# Patient Record
Sex: Female | Born: 1950 | Race: Black or African American | Hispanic: No | Marital: Single | Smoking: Former smoker
Health system: Southern US, Community
[De-identification: ages and names within clinical notes are randomized; demographics above are authoritative.]

## PROBLEM LIST (undated history)

## (undated) DIAGNOSIS — I10 Essential (primary) hypertension: Secondary | ICD-10-CM

## (undated) DIAGNOSIS — F1410 Cocaine abuse, uncomplicated: Secondary | ICD-10-CM

## (undated) DIAGNOSIS — Z91199 Patient's noncompliance with other medical treatment and regimen due to unspecified reason: Secondary | ICD-10-CM

## (undated) DIAGNOSIS — C649 Malignant neoplasm of unspecified kidney, except renal pelvis: Secondary | ICD-10-CM

## (undated) DIAGNOSIS — M792 Neuralgia and neuritis, unspecified: Secondary | ICD-10-CM

## (undated) DIAGNOSIS — N898 Other specified noninflammatory disorders of vagina: Principal | ICD-10-CM

## (undated) DIAGNOSIS — M1990 Unspecified osteoarthritis, unspecified site: Secondary | ICD-10-CM

## (undated) DIAGNOSIS — I471 Supraventricular tachycardia, unspecified: Secondary | ICD-10-CM

## (undated) DIAGNOSIS — J45909 Unspecified asthma, uncomplicated: Secondary | ICD-10-CM

## (undated) DIAGNOSIS — E119 Type 2 diabetes mellitus without complications: Secondary | ICD-10-CM

## (undated) DIAGNOSIS — K219 Gastro-esophageal reflux disease without esophagitis: Secondary | ICD-10-CM

## (undated) DIAGNOSIS — B1920 Unspecified viral hepatitis C without hepatic coma: Secondary | ICD-10-CM

## (undated) DIAGNOSIS — R21 Rash and other nonspecific skin eruption: Secondary | ICD-10-CM

## (undated) DIAGNOSIS — G4730 Sleep apnea, unspecified: Secondary | ICD-10-CM

## (undated) DIAGNOSIS — I6529 Occlusion and stenosis of unspecified carotid artery: Secondary | ICD-10-CM

## (undated) DIAGNOSIS — F1010 Alcohol abuse, uncomplicated: Secondary | ICD-10-CM

## (undated) DIAGNOSIS — E785 Hyperlipidemia, unspecified: Secondary | ICD-10-CM

## (undated) DIAGNOSIS — Z9119 Patient's noncompliance with other medical treatment and regimen: Secondary | ICD-10-CM

## (undated) DIAGNOSIS — B373 Candidiasis of vulva and vagina: Secondary | ICD-10-CM

## (undated) DIAGNOSIS — L900 Lichen sclerosus et atrophicus: Principal | ICD-10-CM

## (undated) HISTORY — PX: TONSILLECTOMY: SUR1361

## (undated) HISTORY — DX: Lichen sclerosus et atrophicus: L90.0

## (undated) HISTORY — DX: Essential (primary) hypertension: I10

## (undated) HISTORY — DX: Supraventricular tachycardia, unspecified: I47.10

## (undated) HISTORY — PX: OTHER SURGICAL HISTORY: SHX169

## (undated) HISTORY — DX: Supraventricular tachycardia: I47.1

## (undated) HISTORY — DX: Sleep apnea, unspecified: G47.30

## (undated) HISTORY — DX: Type 2 diabetes mellitus without complications: E11.9

## (undated) HISTORY — PX: ABDOMINAL HYSTERECTOMY: SHX81

## (undated) HISTORY — DX: Candidiasis of vulva and vagina: B37.3

## (undated) HISTORY — DX: Other specified noninflammatory disorders of vagina: N89.8

---

## 2008-12-23 ENCOUNTER — Emergency Department (HOSPITAL_COMMUNITY): Admission: EM | Admit: 2008-12-23 | Discharge: 2008-12-23 | Payer: Self-pay | Admitting: Emergency Medicine

## 2010-04-25 LAB — URINE CULTURE: Colony Count: >=100000

## 2010-04-25 LAB — URINE MICROSCOPIC-ADD ON

## 2010-04-25 LAB — URINALYSIS, ROUTINE W REFLEX MICROSCOPIC
Bilirubin Urine: NEGATIVE
Glucose, UA: >1000 mg/dL — AB
Ketones, ur: NEGATIVE mg/dL
Nitrite: POSITIVE — AB
Specific Gravity, Urine: 1.015 (ref 1.005–1.030)
pH: 5 (ref 5.0–8.0)

## 2010-06-24 ENCOUNTER — Emergency Department (HOSPITAL_COMMUNITY)
Admission: EM | Admit: 2010-06-24 | Discharge: 2010-06-24 | Disposition: A | Discharge status: Home / Self Care | Payer: Self-pay | Attending: Emergency Medicine | Admitting: Emergency Medicine

## 2010-06-24 DIAGNOSIS — E119 Type 2 diabetes mellitus without complications: Secondary | ICD-10-CM | POA: Insufficient documentation

## 2010-06-24 DIAGNOSIS — I10 Essential (primary) hypertension: Secondary | ICD-10-CM | POA: Insufficient documentation

## 2010-06-24 DIAGNOSIS — Z79899 Other long term (current) drug therapy: Secondary | ICD-10-CM | POA: Insufficient documentation

## 2010-06-24 DIAGNOSIS — G8918 Other acute postprocedural pain: Secondary | ICD-10-CM | POA: Insufficient documentation

## 2010-06-24 DIAGNOSIS — N949 Unspecified condition associated with female genital organs and menstrual cycle: Secondary | ICD-10-CM | POA: Insufficient documentation

## 2010-06-24 LAB — GLUCOSE, CAPILLARY: Glucose-Capillary: 360 mg/dL — ABNORMAL HIGH (ref 70–99)

## 2012-04-10 ENCOUNTER — Encounter (HOSPITAL_COMMUNITY): Payer: Self-pay | Admitting: *Deleted

## 2012-04-10 ENCOUNTER — Emergency Department (HOSPITAL_COMMUNITY)
Admission: EM | Admit: 2012-04-10 | Discharge: 2012-04-11 | Disposition: A | Discharge status: Home / Self Care | Payer: Self-pay | Attending: Emergency Medicine | Admitting: Emergency Medicine

## 2012-04-10 ENCOUNTER — Emergency Department (HOSPITAL_COMMUNITY): Payer: Self-pay

## 2012-04-10 DIAGNOSIS — K5900 Constipation, unspecified: Secondary | ICD-10-CM | POA: Insufficient documentation

## 2012-04-10 DIAGNOSIS — I10 Essential (primary) hypertension: Secondary | ICD-10-CM | POA: Insufficient documentation

## 2012-04-10 DIAGNOSIS — R1110 Vomiting, unspecified: Secondary | ICD-10-CM | POA: Insufficient documentation

## 2012-04-10 DIAGNOSIS — R739 Hyperglycemia, unspecified: Secondary | ICD-10-CM

## 2012-04-10 DIAGNOSIS — Z8669 Personal history of other diseases of the nervous system and sense organs: Secondary | ICD-10-CM | POA: Insufficient documentation

## 2012-04-10 DIAGNOSIS — R109 Unspecified abdominal pain: Secondary | ICD-10-CM | POA: Insufficient documentation

## 2012-04-10 DIAGNOSIS — N390 Urinary tract infection, site not specified: Secondary | ICD-10-CM | POA: Insufficient documentation

## 2012-04-10 DIAGNOSIS — E1169 Type 2 diabetes mellitus with other specified complication: Secondary | ICD-10-CM | POA: Insufficient documentation

## 2012-04-10 DIAGNOSIS — F17200 Nicotine dependence, unspecified, uncomplicated: Secondary | ICD-10-CM | POA: Insufficient documentation

## 2012-04-10 DIAGNOSIS — R7989 Other specified abnormal findings of blood chemistry: Secondary | ICD-10-CM | POA: Insufficient documentation

## 2012-04-10 DIAGNOSIS — R42 Dizziness and giddiness: Secondary | ICD-10-CM | POA: Insufficient documentation

## 2012-04-10 DIAGNOSIS — J45909 Unspecified asthma, uncomplicated: Secondary | ICD-10-CM | POA: Insufficient documentation

## 2012-04-10 HISTORY — DX: Neuralgia and neuritis, unspecified: M79.2

## 2012-04-10 HISTORY — DX: Unspecified asthma, uncomplicated: J45.909

## 2012-04-10 LAB — COMPREHENSIVE METABOLIC PANEL
ALT: 183 U/L — ABNORMAL HIGH (ref 0–35)
Alkaline Phosphatase: 176 U/L — ABNORMAL HIGH (ref 39–117)
BUN: 9 mg/dL (ref 6–23)
CO2: 28 mEq/L (ref 19–32)
Chloride: 95 mEq/L — ABNORMAL LOW (ref 96–112)
GFR calc Af Amer: >90 mL/min (ref >90)
GFR calc non Af Amer: 90 mL/min — ABNORMAL LOW (ref >90)
Glucose, Bld: 468 mg/dL — ABNORMAL HIGH (ref 70–99)
Potassium: 3.1 mEq/L — ABNORMAL LOW (ref 3.5–5.1)
Sodium: 134 mEq/L — ABNORMAL LOW (ref 135–145)
Total Bilirubin: 0.4 mg/dL (ref 0.3–1.2)

## 2012-04-10 LAB — CBC WITH DIFFERENTIAL/PLATELET
Hemoglobin: 14.1 g/dL (ref 12.0–15.0)
Lymphocytes Relative: 60 % — ABNORMAL HIGH (ref 12–46)
Lymphs Abs: 4.4 10*3/uL — ABNORMAL HIGH (ref 0.7–4.0)
MCV: 87.9 fL (ref 78.0–100.0)
Monocytes Relative: 8 % (ref 3–12)
Neutrophils Relative %: 31 % — ABNORMAL LOW (ref 43–77)
Platelets: 181 10*3/uL (ref 150–400)
RBC: 4.78 MIL/uL (ref 3.87–5.11)
WBC: 7.3 10*3/uL (ref 4.0–10.5)

## 2012-04-10 LAB — URINALYSIS, ROUTINE W REFLEX MICROSCOPIC
Bilirubin Urine: NEGATIVE
Ketones, ur: NEGATIVE mg/dL
Nitrite: POSITIVE — AB
Protein, ur: NEGATIVE mg/dL
Urobilinogen, UA: 0.2 mg/dL (ref 0.0–1.0)
pH: 5.5 (ref 5.0–8.0)

## 2012-04-10 LAB — URINE MICROSCOPIC-ADD ON

## 2012-04-10 MED ORDER — INSULIN ASPART 100 UNIT/ML IV SOLN
10.0000 [IU] | Freq: Once | INTRAVENOUS | Status: AC
  Administered 2012-04-10: 10 [IU] via INTRAVENOUS

## 2012-04-10 MED ORDER — CEPHALEXIN 500 MG PO CAPS: 500.0000 mg | ORAL_CAPSULE | Freq: Four times a day (QID) | ORAL | Status: DC

## 2012-04-10 MED ORDER — DEXTROSE 5 % IV SOLN
1.0000 g | Freq: Once | INTRAVENOUS | Status: AC
  Administered 2012-04-10: 1 g via INTRAVENOUS
  Filled 2012-04-10: qty 10

## 2012-04-10 MED ORDER — SODIUM CHLORIDE 0.9 % IV BOLUS (SEPSIS)
1000.0000 mL | Freq: Once | INTRAVENOUS | Status: AC
Start: 2012-04-10 — End: 2012-04-11
  Administered 2012-04-10: 1000 mL via INTRAVENOUS

## 2012-04-10 NOTE — ED Notes (Addendum)
CBG has been over 300 at home., constipated, feels weak,  Dizzy. Nausea , no vomiting in last day.  Alert, ambulatory.

## 2012-04-10 NOTE — ED Provider Notes (Signed)
History  This chart was scribed for Orpah Greek, MD by Roe Coombs, ED Scribe. The patient was seen in room APA07/APA07. Patient's care was started at 2021.   CSN: [id number]  Arrival date & time 04/10/12  2002   First MD Initiated Contact with Patient 04/10/12 2021      Chief Complaint  Patient presents with  . Weakness     The history is provided by the patient. No language interpreter was used.    HPI Comments: Cassandra Jordan is a 62 y.o. female who presents to the Emergency Department complaining of generalized weakness and dizziness for the last few days. There is associated constipation. Patient states that she had multiple episodes of vomiting last Saturday (5 days ago), but none within the last 24 hours. She states that she has upper abdominal discomfort with swallowing and eating. Patient denies headache, chest pain, shortness of breath, fever, dysuria, urinary frequency or diarrhea. She states that her CBG has been over 300 at home. Patient is a current every day smoker.   Past Medical History  Diagnosis Date  . Diabetes mellitus without complication   . Hypertension   . Asthma   . Neuropathic pain     History reviewed. No pertinent past surgical history.  History reviewed. No pertinent family history.  History  Substance Use Topics  . Smoking status: Current Every Day Smoker  . Smokeless tobacco: Not on file  . Alcohol Use: Yes    OB History   Grav Para Term Preterm Abortions TAB SAB Ect Mult Living                  Review of Systems  Constitutional: Negative for fever.  Gastrointestinal: Positive for vomiting, abdominal pain and constipation. Negative for diarrhea.  Genitourinary: Negative for dysuria and frequency.  Neurological: Positive for dizziness and weakness.    Allergies  Sulfa antibiotics  Home Medications  No current outpatient prescriptions on file.  Triage Vitals: BP 136/89  Pulse 107  Resp 20  Ht 5' (1.524 m)  Wt  160 lb (72.576 kg)  BMI 31.25 kg/m2  SpO2 96%  Physical Exam  Constitutional: She is oriented to person, place, and time. She appears well-developed and well-nourished. No distress.  HENT:  Head: Normocephalic and atraumatic.  Right Ear: Hearing normal.  Nose: Nose normal.  Mouth/Throat: Oropharynx is clear and moist and mucous membranes are normal.  Eyes: Conjunctivae and EOM are normal. Pupils are equal, round, and reactive to light.  Neck: Normal range of motion. Neck supple.  Cardiovascular: Normal rate, regular rhythm, S1 normal and S2 normal.  Exam reveals no gallop and no friction rub.   No murmur heard. Pulmonary/Chest: Effort normal and breath sounds normal. No respiratory distress. She exhibits no tenderness.  Abdominal: Soft. Normal appearance and bowel sounds are normal. There is no hepatosplenomegaly. There is no tenderness. There is no rebound, no guarding, no tenderness at McBurney's point and negative Murphy's sign. No hernia.  Musculoskeletal: Normal range of motion.  Neurological: She is alert and oriented to person, place, and time. She has normal strength. No cranial nerve deficit or sensory deficit. Coordination normal. GCS eye subscore is 4. GCS verbal subscore is 5. GCS motor subscore is 6.  Skin: Skin is warm, dry and intact. No rash noted. No cyanosis.  Psychiatric: She has a normal mood and affect. Her speech is normal and behavior is normal. Thought content normal.    ED Course  Procedures (including critical care  time) DIAGNOSTIC STUDIES: Oxygen Saturation is 96% on room air, adequate by my interpretation.    COORDINATION OF CARE: 8:36 PM- Patient informed of current plan for treatment and evaluation and agrees with plan at this time.      Labs Reviewed  CBC WITH DIFFERENTIAL - Abnormal; Notable for the following:    Neutrophils Relative 31 (*)    Lymphocytes Relative 60 (*)    Lymphs Abs 4.4 (*)    All other components within normal limits   COMPREHENSIVE METABOLIC PANEL - Abnormal; Notable for the following:    Sodium 134 (*)    Potassium 3.1 (*)    Chloride 95 (*)    Glucose, Bld 468 (*)    Total Protein 8.5 (*)    AST 144 (*)    ALT 183 (*)    Alkaline Phosphatase 176 (*)    GFR calc non Af Amer 90 (*)    All other components within normal limits  URINALYSIS, ROUTINE W REFLEX MICROSCOPIC - Abnormal; Notable for the following:    APPearance TURBID (*)    Glucose, UA >1000 (*)    Nitrite POSITIVE (*)    All other components within normal limits  URINE MICROSCOPIC-ADD ON - Abnormal; Notable for the following:    Squamous Epithelial / LPF MANY (*)    Bacteria, UA MANY (*)    All other components within normal limits  URINE CULTURE  LIPASE, BLOOD   Dg Abd Acute W/chest  04/10/2012  *RADIOLOGY REPORT*  Clinical Data: Upper abdominal pain and burning.  Vomiting 5 days ago.  Constipation.  Weakness.  ACUTE ABDOMEN SERIES (ABDOMEN 2 VIEW & CHEST 1 VIEW)  Comparison: None.  Findings: The heart size and pulmonary vascularity are normal. The lungs appear clear and expanded without focal air space disease or consolidation. No blunting of the costophrenic angles.  No pneumothorax.  Mediastinal contours appear intact.  Gas and stool throughout the colon.  No small or large bowel distension.  No free intra-abdominal air.  No abnormal air fluid levels.  Calcifications in the pelvis consistent with phleboliths. No radiopaque stones identified.  Mild degenerative changes in the spine.  IMPRESSION: No evidence of active pulmonary disease.  Stool filled colon. Nonobstructive bowel gas pattern.   Original Report Authenticated By: Lucienne Capers, M.D.      Diagnoses: 1. Hyperglycemia 2. UTI 3. Elevated LFTs    MDM  This presents to the ER for evaluation of dizziness. She reports that she has been experiencing high blood sugars today. She has had nausea but no vomiting. She denies headache. Denies any other focal complaints other than she  has been constipated for a few days. Blood work revealed her to grab a 400. She will be administered IV fluids and insulin for the close reduction. No sign of DKA at this time.  Patient does have evidence of urinary tract infection which is treated with Rocephin and will be treated as an outpatient with continued cephalosporin antibiotic. The urinary tract infection is likely causing some of her elevated blood sugars, but there is no sign of pyelonephritis.  Patient's blood work also showed elevated LFTs. Went back to discuss this with the patient she tells me that this has been chronic for her. Her doctor has told her about this before. There is no elevated bilirubin and there is no tenderness. I reexamined her and pressed deeply into her right upper quadrant and there is still no tenderness there. She'll therefore be referred back to her  doctor for this.    I personally performed the services described in this documentation, which was scribed in my presence. The recorded information has been reviewed and is accurate.    Orpah Greek, MD 04/10/12 [phone]

## 2012-04-11 NOTE — ED Provider Notes (Signed)
5612 Assumed care/disposition of patient.Patient has been given insulin. Awaiting result. 0101Glucose after being given IVF and insulin is 211. Spoke with patient regarding the importance of taking medicines of a regular basis. She is in agreement. Will discharge home. Pt stable in ED with no significant deterioration in condition.The patient appears reasonably screened and/or stabilized for discharge and I doubt any other medical condition or other [facility] requiring further screening, evaluation, or treatment in the ED at this time prior to discharge.     Gypsy Balsam. Olin Hauser, MD 04/11/12 [id number]

## 2012-04-13 LAB — URINE CULTURE

## 2012-04-14 NOTE — ED Notes (Signed)
+   Urine Patient treated with Keflex-sensitive to same-chart appended per protocol MD.

## 2012-07-19 ENCOUNTER — Encounter (HOSPITAL_COMMUNITY): Payer: Self-pay | Admitting: *Deleted

## 2012-07-19 ENCOUNTER — Emergency Department (HOSPITAL_COMMUNITY)
Admission: EM | Admit: 2012-07-19 | Discharge: 2012-07-19 | Disposition: A | Discharge status: Home / Self Care | Payer: Self-pay | Attending: Emergency Medicine | Admitting: Emergency Medicine

## 2012-07-19 DIAGNOSIS — IMO0002 Reserved for concepts with insufficient information to code with codable children: Secondary | ICD-10-CM | POA: Insufficient documentation

## 2012-07-19 DIAGNOSIS — I10 Essential (primary) hypertension: Secondary | ICD-10-CM | POA: Insufficient documentation

## 2012-07-19 DIAGNOSIS — E1169 Type 2 diabetes mellitus with other specified complication: Secondary | ICD-10-CM | POA: Insufficient documentation

## 2012-07-19 DIAGNOSIS — Y9301 Activity, walking, marching and hiking: Secondary | ICD-10-CM | POA: Insufficient documentation

## 2012-07-19 DIAGNOSIS — Z794 Long term (current) use of insulin: Secondary | ICD-10-CM | POA: Insufficient documentation

## 2012-07-19 DIAGNOSIS — F17200 Nicotine dependence, unspecified, uncomplicated: Secondary | ICD-10-CM | POA: Insufficient documentation

## 2012-07-19 DIAGNOSIS — Y9289 Other specified places as the place of occurrence of the external cause: Secondary | ICD-10-CM | POA: Insufficient documentation

## 2012-07-19 DIAGNOSIS — R259 Unspecified abnormal involuntary movements: Secondary | ICD-10-CM | POA: Insufficient documentation

## 2012-07-19 DIAGNOSIS — Z8669 Personal history of other diseases of the nervous system and sense organs: Secondary | ICD-10-CM | POA: Insufficient documentation

## 2012-07-19 DIAGNOSIS — R42 Dizziness and giddiness: Secondary | ICD-10-CM | POA: Insufficient documentation

## 2012-07-19 DIAGNOSIS — R5381 Other malaise: Secondary | ICD-10-CM | POA: Insufficient documentation

## 2012-07-19 DIAGNOSIS — R296 Repeated falls: Secondary | ICD-10-CM | POA: Insufficient documentation

## 2012-07-19 DIAGNOSIS — Z79899 Other long term (current) drug therapy: Secondary | ICD-10-CM | POA: Insufficient documentation

## 2012-07-19 DIAGNOSIS — R300 Dysuria: Secondary | ICD-10-CM | POA: Insufficient documentation

## 2012-07-19 DIAGNOSIS — Z91199 Patient's noncompliance with other medical treatment and regimen due to unspecified reason: Secondary | ICD-10-CM | POA: Insufficient documentation

## 2012-07-19 DIAGNOSIS — Z9119 Patient's noncompliance with other medical treatment and regimen: Secondary | ICD-10-CM | POA: Insufficient documentation

## 2012-07-19 DIAGNOSIS — R531 Weakness: Secondary | ICD-10-CM

## 2012-07-19 DIAGNOSIS — J45909 Unspecified asthma, uncomplicated: Secondary | ICD-10-CM | POA: Insufficient documentation

## 2012-07-19 DIAGNOSIS — N390 Urinary tract infection, site not specified: Secondary | ICD-10-CM | POA: Insufficient documentation

## 2012-07-19 DIAGNOSIS — R739 Hyperglycemia, unspecified: Secondary | ICD-10-CM

## 2012-07-19 LAB — CBC WITH DIFFERENTIAL/PLATELET
Basophils Relative: 0 % (ref 0–1)
HCT: 41 % (ref 36.0–46.0)
Hemoglobin: 14 g/dL (ref 12.0–15.0)
Lymphocytes Relative: 49 % — ABNORMAL HIGH (ref 12–46)
MCHC: 34.1 g/dL (ref 30.0–36.0)
Monocytes Absolute: 0.8 10*3/uL (ref 0.1–1.0)
Monocytes Relative: 8 % (ref 3–12)
Neutro Abs: 4.1 10*3/uL (ref 1.7–7.7)

## 2012-07-19 LAB — URINALYSIS, ROUTINE W REFLEX MICROSCOPIC
Bilirubin Urine: NEGATIVE
Glucose, UA: >1000 mg/dL — AB
Ketones, ur: NEGATIVE mg/dL
Nitrite: POSITIVE — AB
Protein, ur: NEGATIVE mg/dL
Specific Gravity, Urine: 1.015 (ref 1.005–1.030)
Urobilinogen, UA: 2 mg/dL — ABNORMAL HIGH (ref 0.0–1.0)
pH: 5.5 (ref 5.0–8.0)

## 2012-07-19 LAB — BASIC METABOLIC PANEL
BUN: 8 mg/dL (ref 6–23)
CO2: 28 mEq/L (ref 19–32)
Chloride: 94 mEq/L — ABNORMAL LOW (ref 96–112)
Creatinine, Ser: 0.54 mg/dL (ref 0.50–1.10)

## 2012-07-19 LAB — GLUCOSE, CAPILLARY: Glucose-Capillary: 310 mg/dL — ABNORMAL HIGH (ref 70–99)

## 2012-07-19 MED ORDER — SODIUM CHLORIDE 0.9 % IV SOLN: INTRAVENOUS | Status: DC

## 2012-07-19 MED ORDER — SODIUM CHLORIDE 0.9 % IV BOLUS (SEPSIS)
1000.0000 mL | Freq: Once | INTRAVENOUS | Status: AC
  Administered 2012-07-19: 1000 mL via INTRAVENOUS

## 2012-07-19 MED ORDER — CEPHALEXIN 500 MG PO CAPS
500.0000 mg | ORAL_CAPSULE | Freq: Once | ORAL | Status: AC
Start: 2012-07-19 — End: 2012-07-19
  Administered 2012-07-19: 500 mg via ORAL
  Filled 2012-07-19: qty 1

## 2012-07-19 MED ORDER — CEPHALEXIN 500 MG PO CAPS: 500.0000 mg | ORAL_CAPSULE | Freq: Four times a day (QID) | ORAL | Status: DC

## 2012-07-19 NOTE — ED Provider Notes (Signed)
History  This chart was scribed for Richarda Blade, MD by Elby Beck, ED Scribe. This patient was seen in room APA09/APA09 and the patient's care was started at 5:24 PM.  CSN: [id number]  Arrival date & time 07/19/12  1703    Chief Complaint  Patient presents with  . Knee Pain    The history is provided by the patient. No language interpreter was used.   HPI Comments: Cassandra Jordan is a 62 y.o. female with a hx of DM and neuropathic pain who presents to the Emergency Department complaining of constant, moderate bilateral knee pain onset after a fall that occurred yesterday. Pt has surface abrasions to both knees. Pt states that her legs "got shaky and gave out" just prior to the fall that occurred yesterday, while walking up am incline in her carport. Pt is able to bear weight and ambulate with exacerbation of pain. Pt states that she has not been able to check her blood sugar in two days due to not having any strips, and she thinks that her blood sugar may have dropped yesterday causing the fall. Pt denies dizziness or LOC prior to the fall, but states that she occasionally has dizziness and weakness, when she is non-compliant with her insulin and does not eat well. Pt states that her legs are shaky nearly every day due to her diabetes. Pt states that was compliant with her insulin yesterday, which she injects 4x day, but she states that she is sometimes non-compliant. Pt did not take her insulin today. Pt also take BP meds on a daily basis. Pt states that for the past 2-3 months, she has had a burning sensation with urination and an intermittent burning sensation to her legs. Pt states that her last Tetanus shot was last year. Pt denies bowel incontinence, diarrhea, constipation, fever, sore throat, back pain or any other symptoms.  PCP- Dr. Jeanella Anton    Past Medical History  Diagnosis Date  . Diabetes mellitus without complication   . Hypertension   . Asthma   .  Neuropathic pain    Past Surgical History  Procedure Laterality Date  . Abdominal hysterectomy     No family history on file. History  Substance Use Topics  . Smoking status: Current Every Day Smoker  . Smokeless tobacco: Not on file  . Alcohol Use: Yes   OB History   Grav Para Term Preterm Abortions TAB SAB Ect Mult Living                 Review of Systems  Constitutional: Negative for fever and chills.  HENT: Negative for congestion, sore throat and rhinorrhea.   Eyes: Negative for visual disturbance.  Respiratory: Negative for cough and shortness of breath.   Cardiovascular: Negative for chest pain.  Gastrointestinal: Negative for nausea, vomiting, abdominal pain, diarrhea and constipation.  Genitourinary: Positive for dysuria (burning).  Musculoskeletal: Negative for back pain.  Skin: Negative for rash.  Neurological: Positive for dizziness and weakness.  Psychiatric/Behavioral: Negative for confusion.   A complete 10 system review of systems was obtained and all systems are negative except as noted in the HPI and PMH.   Allergies  Sulfa antibiotics  Home Medications   Current Outpatient Rx  Name  Route  Sig  Dispense  Refill  . amitriptyline (ELAVIL) 10 MG tablet   Oral   Take 10 mg by mouth at bedtime.         . Amlodipine-Valsartan-HCTZ (EXFORGE HCT) 10-160-25 MG  TABS   Oral   Take 1 tablet by mouth daily.         . cephALEXin (KEFLEX) 500 MG capsule   Oral   Take 1 capsule (500 mg total) by mouth 4 (four) times daily.   28 capsule   0   . Fluticasone-Salmeterol (ADVAIR) 250-50 MCG/DOSE AEPB   Inhalation   Inhale 1 puff into the lungs every 12 (twelve) hours.         . insulin detemir (LEVEMIR) 100 UNIT/ML injection   Subcutaneous   Inject 70 Units into the skin at bedtime.         . insulin lispro (HUMALOG) 100 UNIT/ML injection   Subcutaneous   Inject 6 Units into the skin 3 (three) times daily before meals.         Marland Kitchen omeprazole  (PRILOSEC) 20 MG capsule   Oral   Take 20 mg by mouth daily.          Triage Vitals: BP 125/99  Pulse 110  Temp(Src) 98.1 F (36.7 C) (Oral)  Resp 16  SpO2 100%  Physical Exam  Nursing note and vitals reviewed. Constitutional: She is oriented to person, place, and time. She appears well-developed and well-nourished.  HENT:  Head: Normocephalic and atraumatic.  Eyes: Conjunctivae and EOM are normal. Pupils are equal, round, and reactive to light.  Neck: Normal range of motion and phonation normal. Neck supple.  Cardiovascular: Normal rate, regular rhythm and intact distal pulses.   Pulmonary/Chest: Effort normal and breath sounds normal. She exhibits no tenderness.  Abdominal: Soft. She exhibits no distension. There is no tenderness. There is no guarding.  Genitourinary:  No costovertebral angle tenderness.  Musculoskeletal: Normal range of motion.  Mild right lumbar tenderness.  Neurological: She is alert and oriented to person, place, and time. She has normal strength. She exhibits normal muscle tone.  Skin: Skin is warm and dry.  Psychiatric: She has a normal mood and affect. Her behavior is normal. Judgment and thought content normal.    ED Course  Procedures (including critical care time)  DIAGNOSTIC STUDIES: Oxygen Saturation is 100% on RA, normal by my interpretation.    COORDINATION OF CARE: 5:39 PM- Pt advised of plan for diagnostic lab work and pt agrees. 7:00 PM- Pt advised that her urine sample showed an infection, and pt is agreeable to antibiotic on discharge.  Medications  sodium chloride 0.9 % bolus 1,000 mL (0 mLs Intravenous Stopped 07/19/12 1901)  cephALEXin (KEFLEX) capsule 500 mg (500 mg Oral Given 07/19/12 1901)   No data found.  Labs Reviewed  GLUCOSE, CAPILLARY - Abnormal; Notable for the following:    Glucose-Capillary 310 (*)    All other components within normal limits  CBC WITH DIFFERENTIAL - Abnormal; Notable for the following:     Neutrophils Relative % 42 (*)    Lymphocytes Relative 49 (*)    Lymphs Abs 4.8 (*)    All other components within normal limits  BASIC METABOLIC PANEL - Abnormal; Notable for the following:    Sodium 131 (*)    Chloride 94 (*)    Glucose, Bld 316 (*)    All other components within normal limits  URINALYSIS, ROUTINE W REFLEX MICROSCOPIC - Abnormal; Notable for the following:    APPearance CLOUDY (*)    Glucose, UA >1000 (*)    Hgb urine dipstick TRACE (*)    Urobilinogen, UA 2.0 (*)    Nitrite POSITIVE (*)    Leukocytes, UA SMALL (*)  All other components within normal limits  URINE MICROSCOPIC-ADD ON - Abnormal; Notable for the following:    Squamous Epithelial / LPF MANY (*)    Bacteria, UA MANY (*)    All other components within normal limits  URINE CULTURE     1. Weakness   2. UTI (lower urinary tract infection)   3. Hyperglycemia     MDM  Fall with contusions and abrasions. Uncomplicated UTI, likely contributed to fall. Doubt Pyelonephritis or bacteremia. Doubt metabolic instability, serious bacterial infection or impending vascular collapse; the patient is stable for discharge.   Nursing Notes Reviewed/ Care Coordinated Applicable Imaging Reviewed Interpretation of Laboratory Data incorporated into ED treatment  Plan: Home Medications- Keflex; Home Treatments- usual meds, rest, fluids, watch for progressive sx; return here if the recommended treatment, does not improve the symptoms; Recommended follow up-PCP 1 week, and prn        I personally performed the services described in this documentation, which was scribed in my presence. The recorded information has been reviewed and is accurate.    Richarda Blade, MD 07/21/12 [phone]

## 2012-07-19 NOTE — ED Notes (Signed)
Pt c/o bilateral knee pain after falling on the porch yesterday when her legs began to "shaking and gave out". Pt has abrasions to both knees, cms intact distal, pt states that she thinks that her blood sugar may have dropped yesterday causing the fall. Has not been able to check her blood sugar in two days due to not having any strips.

## 2012-07-22 LAB — URINE CULTURE: Colony Count: >=100000

## 2012-07-23 NOTE — ED Notes (Signed)
+   Urine Culture Treated with Cephalexin. Ok per [employer]

## 2012-10-01 ENCOUNTER — Encounter (HOSPITAL_COMMUNITY): Payer: Self-pay | Admitting: *Deleted

## 2012-10-01 ENCOUNTER — Emergency Department (HOSPITAL_COMMUNITY)
Admission: EM | Admit: 2012-10-01 | Discharge: 2012-10-01 | Disposition: A | Discharge status: Home / Self Care | Payer: Self-pay | Attending: Emergency Medicine | Admitting: Emergency Medicine

## 2012-10-01 DIAGNOSIS — R51 Headache: Secondary | ICD-10-CM | POA: Insufficient documentation

## 2012-10-01 DIAGNOSIS — J45909 Unspecified asthma, uncomplicated: Secondary | ICD-10-CM | POA: Insufficient documentation

## 2012-10-01 DIAGNOSIS — I10 Essential (primary) hypertension: Secondary | ICD-10-CM | POA: Insufficient documentation

## 2012-10-01 DIAGNOSIS — E119 Type 2 diabetes mellitus without complications: Secondary | ICD-10-CM | POA: Insufficient documentation

## 2012-10-01 LAB — GLUCOSE, CAPILLARY: Glucose-Capillary: 127 mg/dL — ABNORMAL HIGH (ref 70–99)

## 2012-10-01 NOTE — ED Notes (Signed)
Pt states she has been eating cookies all day and now her blood sugar is high and she has a slight headache.

## 2012-12-13 ENCOUNTER — Emergency Department (HOSPITAL_COMMUNITY)
Admission: EM | Admit: 2012-12-13 | Discharge: 2012-12-13 | Disposition: A | Discharge status: Home / Self Care | Payer: Self-pay | Attending: Emergency Medicine | Admitting: Emergency Medicine

## 2012-12-13 ENCOUNTER — Encounter (HOSPITAL_COMMUNITY): Payer: Self-pay | Admitting: Emergency Medicine

## 2012-12-13 DIAGNOSIS — Z792 Long term (current) use of antibiotics: Secondary | ICD-10-CM | POA: Insufficient documentation

## 2012-12-13 DIAGNOSIS — R739 Hyperglycemia, unspecified: Secondary | ICD-10-CM

## 2012-12-13 DIAGNOSIS — Z79899 Other long term (current) drug therapy: Secondary | ICD-10-CM | POA: Insufficient documentation

## 2012-12-13 DIAGNOSIS — Z8739 Personal history of other diseases of the musculoskeletal system and connective tissue: Secondary | ICD-10-CM | POA: Insufficient documentation

## 2012-12-13 DIAGNOSIS — B372 Candidiasis of skin and nail: Secondary | ICD-10-CM | POA: Insufficient documentation

## 2012-12-13 DIAGNOSIS — I10 Essential (primary) hypertension: Secondary | ICD-10-CM | POA: Insufficient documentation

## 2012-12-13 DIAGNOSIS — J45909 Unspecified asthma, uncomplicated: Secondary | ICD-10-CM | POA: Insufficient documentation

## 2012-12-13 DIAGNOSIS — F17200 Nicotine dependence, unspecified, uncomplicated: Secondary | ICD-10-CM | POA: Insufficient documentation

## 2012-12-13 DIAGNOSIS — Z794 Long term (current) use of insulin: Secondary | ICD-10-CM | POA: Insufficient documentation

## 2012-12-13 DIAGNOSIS — E119 Type 2 diabetes mellitus without complications: Secondary | ICD-10-CM | POA: Insufficient documentation

## 2012-12-13 DIAGNOSIS — IMO0002 Reserved for concepts with insufficient information to code with codable children: Secondary | ICD-10-CM | POA: Insufficient documentation

## 2012-12-13 LAB — CBC WITH DIFFERENTIAL/PLATELET
Basophils Absolute: 0 10*3/uL (ref 0.0–0.1)
Basophils Relative: 0 % (ref 0–1)
HCT: 41.6 % (ref 36.0–46.0)
Hemoglobin: 14 g/dL (ref 12.0–15.0)
Lymphocytes Relative: 62 % — ABNORMAL HIGH (ref 12–46)
Monocytes Absolute: 0.5 10*3/uL (ref 0.1–1.0)
Monocytes Relative: 6 % (ref 3–12)
Neutro Abs: 2.5 10*3/uL (ref 1.7–7.7)
Neutrophils Relative %: 31 % — ABNORMAL LOW (ref 43–77)
RDW: 13.2 % (ref 11.5–15.5)
WBC: 8.3 10*3/uL (ref 4.0–10.5)

## 2012-12-13 LAB — GLUCOSE, CAPILLARY
Glucose-Capillary: 458 mg/dL — ABNORMAL HIGH (ref 70–99)
Glucose-Capillary: 256 mg/dL — ABNORMAL HIGH (ref 70–99)

## 2012-12-13 LAB — BASIC METABOLIC PANEL
CO2: 29 mEq/L (ref 19–32)
Chloride: 96 mEq/L (ref 96–112)
Creatinine, Ser: 0.58 mg/dL (ref 0.50–1.10)
GFR calc Af Amer: >90 mL/min (ref >90)
Potassium: 3.2 mEq/L — ABNORMAL LOW (ref 3.5–5.1)

## 2012-12-13 MED ORDER — SODIUM CHLORIDE 0.9 % IV SOLN
INTRAVENOUS | Status: DC
  Filled 2012-12-13: qty 1

## 2012-12-13 MED ORDER — SODIUM CHLORIDE 0.9 % IV SOLN
1000.0000 mL | Freq: Once | INTRAVENOUS | Status: AC
  Administered 2012-12-13: 1000 mL via INTRAVENOUS

## 2012-12-13 MED ORDER — FLUCONAZOLE 100 MG PO TABS
150.0000 mg | ORAL_TABLET | Freq: Every day | ORAL | Status: DC
  Administered 2012-12-13: 150 mg via ORAL
  Filled 2012-12-13 (×2): qty 2

## 2012-12-13 MED ORDER — SODIUM CHLORIDE 0.9 % IV SOLN: 1000.0000 mL | INTRAVENOUS | Status: DC

## 2012-12-13 NOTE — ED Provider Notes (Signed)
CSN: [id number]     Arrival date & time 12/13/12  1615 History  This chart was scribed for Cassandra Norrie, MD by Roxan Diesel, ED scribe.  This patient was seen in room APA04/APA04 and the patient's care was started at 4:50 PM.   Chief Complaint  Patient presents with  . Rash    The history is provided by the patient. No language interpreter was used.    HPI Comments: Cassandra Jordan is a 62 y.o. female with h/o DM who presents to the Emergency Department complaining of a persistent, worsening rash under her right breast that she first noticed 3-4 weeks ago, with associated hyperglycemia.  Pt states that the rash is painful at times, draining, and occasionally itchy.  Drainage is described as blood and pus.  She denies nausea, vomiting, or fever.  Pt admits to prior h/o similar rash on her arm but denies having had any other in that area.  She was diagnosed with DM 8-10 years ago and states it has always been difficult to control.  Last week she states her blood sugar was ranging from 170-300 when she checked it in the morning.  On arrival today it is 458.  She states that for the past month she has also been very thirsty and urinating frequently.  She normally gets up to urinate 2 times at night.  She controls her DM with diet, Levemir (2 injections per day) and Humalog (2 injections per day) and denies recent noncompliance.  She ate an egg sandwich for dinner last night which she made herself.  For breakfast today she also ate an egg sandwich and cranberry juice.    Pt goes to [facility] [employer].    She smokes 3 cigarettes per day.  She drinks one beer every other day.  She is not working and is trying to get on disability.   Past Medical History  Diagnosis Date  . Diabetes mellitus without complication   . Hypertension   . Asthma   . Neuropathic pain     Past Surgical History  Procedure Laterality Date  . Abdominal hysterectomy      History reviewed. No pertinent  family history.   History  Substance Use Topics  . Smoking status: Current Every Day Smoker  . Smokeless tobacco: Not on file  . Alcohol Use: Yes  lives at home  Drinks 1 beer QOD "for my neuropathy" Applying for disability   OB History   Grav Para Term Preterm Abortions TAB SAB Ect Mult Living                  Review of Systems  Constitutional: Negative for fever.  Gastrointestinal: Negative for nausea and vomiting.  Endocrine: Positive for polydipsia and polyuria.  Skin: Positive for rash.  All other systems reviewed and are negative.     Allergies  Sulfa antibiotics  Home Medications   Current Outpatient Rx  Name  Route  Sig  Dispense  Refill  . amitriptyline (ELAVIL) 10 MG tablet   Oral   Take 10 mg by mouth at bedtime.         . Amlodipine-Valsartan-HCTZ (EXFORGE HCT) 10-160-25 MG TABS   Oral   Take 1 tablet by mouth daily.         . cephALEXin (KEFLEX) 500 MG capsule   Oral   Take 1 capsule (500 mg total) by mouth 4 (four) times daily.   28 capsule   0   . Fluticasone-Salmeterol (  ADVAIR) 250-50 MCG/DOSE AEPB   Inhalation   Inhale 1 puff into the lungs every 12 (twelve) hours.         . insulin detemir (LEVEMIR) 100 UNIT/ML injection   Subcutaneous   Inject 70 Units into the skin at bedtime.         . insulin lispro (HUMALOG) 100 UNIT/ML injection   Subcutaneous   Inject 6 Units into the skin 3 (three) times daily before meals.         Marland Kitchen omeprazole (PRILOSEC) 20 MG capsule   Oral   Take 20 mg by mouth daily.          BP 109/68  Pulse 90  Temp(Src) 98.2 F (36.8 C) (Oral)  Resp 18  Ht 5' (1.524 m)  Wt 162 lb (73.483 kg)  BMI 31.64 kg/m2  SpO2 97%  Vital signs normal    Physical Exam  Nursing note and vitals reviewed. Constitutional: She is oriented to person, place, and time. She appears well-developed and well-nourished.  Non-toxic appearance. She does not appear ill. No distress.  HENT:  Head: Normocephalic and  atraumatic.  Right Ear: External ear normal.  Left Ear: External ear normal.  Nose: Nose normal. No mucosal edema or rhinorrhea.  Mouth/Throat: Oropharynx is clear and moist and mucous membranes are normal. No dental abscesses or uvula swelling.  Eyes: Conjunctivae and EOM are normal. Pupils are equal, round, and reactive to light.  Neck: Normal range of motion and full passive range of motion without pain. Neck supple.  Cardiovascular: Normal rate, regular rhythm and normal heart sounds.  Exam reveals no gallop and no friction rub.   No murmur heard. Pulmonary/Chest: Effort normal and breath sounds normal. No respiratory distress. She has no wheezes. She has no rhonchi. She has no rales. She exhibits no tenderness and no crepitus.    Abdominal: Soft. Normal appearance and bowel sounds are normal. She exhibits no distension. There is no tenderness. There is no rebound and no guarding.  Musculoskeletal: Normal range of motion. She exhibits no edema and no tenderness.  Moves all extremities well.   Neurological: She is alert and oriented to person, place, and time. She has normal strength. No cranial nerve deficit.  Skin: Skin is warm and intact. Rash noted. No pallor.  Redness in the crease underneath right breat with small satellite lesions and some white drainage consistent with yeast  Psychiatric: She has a normal mood and affect. Her speech is normal and behavior is normal. Her mood appears not anxious.    ED Course  Procedures (including critical care time)  Medications  0.9 %  sodium chloride infusion (0 mLs Intravenous Stopped 12/13/12 1804)    Followed by  0.9 %  sodium chloride infusion (not administered)  fluconazole (DIFLUCAN) tablet 150 mg (not administered)     DIAGNOSTIC STUDIES: Oxygen Saturation is 97% on room air, normal by my interpretation.    COORDINATION OF CARE: 4:59 PM-Discussed treatment plan which includes IV fluids, insulin, and labs with pt at bedside and  pt agreed to plan.   Pt started on IV fluids. Insulin drip ordered however her CBG improved to 256 with just IV fluids.    Results for orders placed during the hospital encounter of 12/13/12  GLUCOSE, CAPILLARY      Result Value Range   Glucose-Capillary 458 (*) 70 - 99 mg/dL  CBC WITH DIFFERENTIAL      Result Value Range   WBC 8.3  4.0 - 10.5 K/uL  RBC 4.68  3.87 - 5.11 MIL/uL   Hemoglobin 14.0  12.0 - 15.0 g/dL   HCT 41.6  36.0 - 46.0 %   MCV 88.9  78.0 - 100.0 fL   MCH 29.9  26.0 - 34.0 pg   MCHC 33.7  30.0 - 36.0 g/dL   RDW 13.2  11.5 - 15.5 %   Platelets 177  150 - 400 K/uL   Neutrophils Relative % 31 (*) 43 - 77 %   Neutro Abs 2.5  1.7 - 7.7 K/uL   Lymphocytes Relative 62 (*) 12 - 46 %   Lymphs Abs 5.2 (*) 0.7 - 4.0 K/uL   Monocytes Relative 6  3 - 12 %   Monocytes Absolute 0.5  0.1 - 1.0 K/uL   Eosinophils Relative 1  0 - 5 %   Eosinophils Absolute 0.1  0.0 - 0.7 K/uL   Basophils Relative 0  0 - 1 %   Basophils Absolute 0.0  0.0 - 0.1 K/uL  BASIC METABOLIC PANEL      Result Value Range   Sodium 134 (*) 135 - 145 mEq/L   Potassium 3.2 (*) 3.5 - 5.1 mEq/L   Chloride 96  96 - 112 mEq/L   CO2 29  19 - 32 mEq/L   Glucose, Bld 361 (*) 70 - 99 mg/dL   BUN 13  6 - 23 mg/dL   Creatinine, Ser 0.58  0.50 - 1.10 mg/dL   Calcium 9.8  8.4 - 10.5 mg/dL   GFR calc non Af Amer >90  >90 mL/min   GFR calc Af Amer >90  >90 mL/min  GLUCOSE, CAPILLARY      Result Value Range   Glucose-Capillary 256 (*) 70 - 99 mg/dL   Laboratory interpretation all normal except hyperglycemia   MDM   1. Hyperglycemia   2. Candidal skin infection     OTC antifungals  Plan discharge  Rolland Porter, MD, FACEP     I personally performed the services described in this documentation, which was scribed in my presence. The recorded information has been reviewed and considered.  Rolland Porter, MD, [facility] Sander    Cassandra Norrie, MD 12/13/12 [phone]

## 2012-12-13 NOTE — ED Notes (Signed)
Pt with rash looking area under right breast, pt admits to having diabetes

## 2013-05-14 ENCOUNTER — Encounter (HOSPITAL_COMMUNITY): Payer: Self-pay | Admitting: Emergency Medicine

## 2013-05-14 ENCOUNTER — Emergency Department (HOSPITAL_COMMUNITY): Payer: BC Managed Care – PPO

## 2013-05-14 ENCOUNTER — Emergency Department (HOSPITAL_COMMUNITY)
Admission: EM | Admit: 2013-05-14 | Discharge: 2013-05-14 | Disposition: A | Discharge status: Home / Self Care | Payer: BC Managed Care – PPO | Attending: Emergency Medicine | Admitting: Emergency Medicine

## 2013-05-14 DIAGNOSIS — R7401 Elevation of levels of liver transaminase levels: Secondary | ICD-10-CM

## 2013-05-14 DIAGNOSIS — B1920 Unspecified viral hepatitis C without hepatic coma: Secondary | ICD-10-CM

## 2013-05-14 DIAGNOSIS — IMO0002 Reserved for concepts with insufficient information to code with codable children: Secondary | ICD-10-CM | POA: Insufficient documentation

## 2013-05-14 DIAGNOSIS — F29 Unspecified psychosis not due to a substance or known physiological condition: Secondary | ICD-10-CM | POA: Insufficient documentation

## 2013-05-14 DIAGNOSIS — Z794 Long term (current) use of insulin: Secondary | ICD-10-CM | POA: Insufficient documentation

## 2013-05-14 DIAGNOSIS — G589 Mononeuropathy, unspecified: Secondary | ICD-10-CM | POA: Insufficient documentation

## 2013-05-14 DIAGNOSIS — R7402 Elevation of levels of lactic acid dehydrogenase (LDH): Secondary | ICD-10-CM | POA: Insufficient documentation

## 2013-05-14 DIAGNOSIS — F17200 Nicotine dependence, unspecified, uncomplicated: Secondary | ICD-10-CM | POA: Insufficient documentation

## 2013-05-14 DIAGNOSIS — Z79899 Other long term (current) drug therapy: Secondary | ICD-10-CM | POA: Insufficient documentation

## 2013-05-14 DIAGNOSIS — E119 Type 2 diabetes mellitus without complications: Secondary | ICD-10-CM | POA: Insufficient documentation

## 2013-05-14 DIAGNOSIS — J45909 Unspecified asthma, uncomplicated: Secondary | ICD-10-CM | POA: Insufficient documentation

## 2013-05-14 DIAGNOSIS — R740 Nonspecific elevation of levels of transaminase and lactic acid dehydrogenase [LDH]: Principal | ICD-10-CM

## 2013-05-14 DIAGNOSIS — I10 Essential (primary) hypertension: Secondary | ICD-10-CM | POA: Insufficient documentation

## 2013-05-14 HISTORY — DX: Unspecified viral hepatitis C without hepatic coma: B19.20

## 2013-05-14 LAB — CBC WITH DIFFERENTIAL/PLATELET
Basophils Absolute: 0 10*3/uL (ref 0.0–0.1)
Basophils Relative: 0 % (ref 0–1)
Eosinophils Absolute: 0.1 10*3/uL (ref 0.0–0.7)
Eosinophils Relative: 2 % (ref 0–5)
HCT: 37.7 % (ref 36.0–46.0)
Hemoglobin: 12.6 g/dL (ref 12.0–15.0)
LYMPHS ABS: 5.6 10*3/uL — AB (ref 0.7–4.0)
LYMPHS PCT: 63 % — AB (ref 12–46)
MCH: 29.8 pg (ref 26.0–34.0)
MCHC: 33.4 g/dL (ref 30.0–36.0)
MCV: 89.1 fL (ref 78.0–100.0)
Monocytes Absolute: 0.6 10*3/uL (ref 0.1–1.0)
Monocytes Relative: 7 % (ref 3–12)
NEUTROS PCT: 28 % — AB (ref 43–77)
Neutro Abs: 2.5 10*3/uL (ref 1.7–7.7)
PLATELETS: 182 10*3/uL (ref 150–400)
RBC: 4.23 MIL/uL (ref 3.87–5.11)
RDW: 13.8 % (ref 11.5–15.5)
WBC: 8.8 10*3/uL (ref 4.0–10.5)

## 2013-05-14 LAB — COMPREHENSIVE METABOLIC PANEL
ALT: 118 U/L — ABNORMAL HIGH (ref 0–35)
AST: 138 U/L — ABNORMAL HIGH (ref 0–37)
Albumin: 2.9 g/dL — ABNORMAL LOW (ref 3.5–5.2)
Alkaline Phosphatase: 152 U/L — ABNORMAL HIGH (ref 39–117)
BUN: 7 mg/dL (ref 6–23)
CALCIUM: 8.7 mg/dL (ref 8.4–10.5)
CO2: 29 mEq/L (ref 19–32)
Chloride: 100 mEq/L (ref 96–112)
Creatinine, Ser: 0.52 mg/dL (ref 0.50–1.10)
GFR calc non Af Amer: >90 mL/min (ref >90)
GLUCOSE: 337 mg/dL — AB (ref 70–99)
Potassium: 3.6 mEq/L — ABNORMAL LOW (ref 3.7–5.3)
SODIUM: 138 meq/L (ref 137–147)
TOTAL PROTEIN: 7.5 g/dL (ref 6.0–8.3)
Total Bilirubin: 0.3 mg/dL (ref 0.3–1.2)

## 2013-05-14 LAB — ETHANOL

## 2013-05-14 LAB — AMMONIA: AMMONIA: 40 umol/L (ref 11–60)

## 2013-05-14 LAB — LIPASE, BLOOD: LIPASE: 16 U/L (ref 11–59)

## 2013-05-14 MED ORDER — LORAZEPAM 1 MG PO TABS: 1.0000 mg | ORAL_TABLET | Freq: Three times a day (TID) | ORAL | Status: DC | PRN

## 2013-05-14 NOTE — ED Provider Notes (Signed)
CSN: [id number]     Arrival date & time 05/14/13  1234 History  This chart was scribed for [employer]. Alvino Chapel, MD by Roxan Diesel, ED scribe.  This patient was seen in room APA04/APA04 and the patient's care was started at 1:52 PM.   Chief Complaint  Patient presents with  . Abnormal Lab    The history is provided by the patient and a relative. No language interpreter was used.    HPI Comments: Cassandra Jordan is a 63 y.o. female with h/o hepatitis C (per pt), DM, and HTN who presents to the Emergency Department complaining of abnormal lab result.  Pt was at her PCP today for a regular office visit with bloodwork and was sent to the ED due to elevated ammonia   Pt states she would not have come to the ED otherwise and she is not aware of any new symptoms or changes to chronic symptoms.  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  She reports occasional confusion but she does not feel it is worse than usual.  Daughter states "she does get forgetful" but it does not seem to be worsening.  Pt states she was diagnosed with hepatitis C 1-2 years ago but she has never been on treatment for this and is not seen by anyone for it.  She regularly drinks one 40-ounce per day.  She does not take Tylenol regularly.  She takes Neurontin for her neuropathic pain.   Past Medical History  Diagnosis Date  . Diabetes mellitus without complication   . Hypertension   . Asthma   . Neuropathic pain   . Hepatitis C     Past Surgical History  Procedure Laterality Date  . Abdominal hysterectomy      History reviewed. No pertinent family history.   History  Substance Use Topics  . Smoking status: Current Every Day Smoker  . Smokeless tobacco: Not on file  . Alcohol Use: Yes    OB History   Grav Para Term Preterm Abortions TAB SAB Ect Mult Living                   Review of Systems  Neurological:       Neuropathy in feet  Hematological: Does not  bruise/bleed easily.  Psychiatric/Behavioral: Positive for confusion.      Allergies  Sulfa antibiotics  Home Medications   Prior to Admission medications   Medication Sig Start Date End Date Taking? Authorizing Provider  amitriptyline (ELAVIL) 10 MG tablet Take 10 mg by mouth at bedtime.    Historical Provider, MD  Amlodipine-Valsartan-HCTZ (EXFORGE HCT) 10-160-25 MG TABS Take 1 tablet by mouth daily.    Historical Provider, MD  Fluticasone-Salmeterol (ADVAIR) 250-50 MCG/DOSE AEPB Inhale 1 puff into the lungs every 12 (twelve) hours.    Historical Provider, MD  ibuprofen (ADVIL,MOTRIN) 200 MG tablet Take 200 mg by mouth every 4 (four) hours as needed. pain    Historical Provider, MD  insulin detemir (LEVEMIR) 100 UNIT/ML injection Inject 70 Units into the skin at bedtime.    Historical Provider, MD  insulin lispro (HUMALOG) 100 UNIT/ML injection Inject 6 Units into the skin 3 (three) times daily before meals.    Historical Provider, MD  omeprazole (PRILOSEC) 20 MG capsule Take 20 mg by mouth daily.    Historical Provider, MD   BP 137/85  Pulse 105  Temp(Src) 97.9 F (36.6 C) (Oral)  Resp 18  Ht  5' (1.524 m)  Wt 152 lb (68.947 kg)  BMI 29.69 kg/m2  SpO2 95%  Physical Exam  Nursing note and vitals reviewed. Constitutional: She is oriented to person, place, and time. She appears well-developed and well-nourished. No distress.  Awake, appropriate  HENT:  Head: Normocephalic and atraumatic.  Eyes: EOM are normal.  Neck: Neck supple. No tracheal deviation present.  Cardiovascular: Normal rate.   Pulmonary/Chest: Effort normal. No respiratory distress.  Abdominal:  Possible mild hepatomegaly  Musculoskeletal: Normal range of motion.  No peripheral edema  Neurological: She is alert and oriented to person, place, and time.  Mild tremor in hands  Skin: Skin is warm and dry.  No jaundice  Psychiatric: She has a normal mood and affect. Her behavior is normal.    ED Course   Procedures (including critical care time)  DIAGNOSTIC STUDIES: Oxygen Saturation is 95% on room air, adequate by my interpretation.    COORDINATION OF CARE: 2:01 PM-Discussed treatment plan with pt at bedside and pt agreed to plan.    Labs Review Labs Reviewed  COMPREHENSIVE METABOLIC PANEL - Abnormal; Notable for the following:    Potassium 3.6 (*)    Glucose, Bld 337 (*)    Albumin 2.9 (*)    AST 138 (*)    ALT 118 (*)    Alkaline Phosphatase 152 (*)    All other components within normal limits  CBC WITH DIFFERENTIAL - Abnormal; Notable for the following:    Neutrophils Relative % 28 (*)    Lymphocytes Relative 63 (*)    Lymphs Abs 5.6 (*)    All other components within normal limits  LIPASE, BLOOD  AMMONIA  ETHANOL    Imaging Review US Abdomen Limited Ruq  05/14/2013   CLINICAL DATA:  Right upper quadrant pain  EXAM: US ABDOMEN LIMITED - RIGHT UPPER QUADRANT  COMPARISON:  None.  FINDINGS: Gallbladder:  Partially contracted. The dependent echogenic material is noted with posterior acoustical shadowing. This likely represents in part gallstones and likely some gallbladder sludge. No wall thickening is seen.  Common bile duct:  Diameter: 3 mm.  Liver:  No focal lesion identified. Within normal limits in parenchymal echogenicity.  IMPRESSION: Cholelithiasis and gallbladder sludge. No acute abnormality is noted.   Electronically Signed   By: Inez Catalina M.D.   On: 05/14/2013 16:15     EKG Interpretation None      MDM   Final diagnoses:  Transaminitis  Hepatitis C    Patient sent in for transaminitis with elevated ammonia. Discussed with Dr. Buford Dresser, who will see the patient in the office in followup. She has a history of hepatitis C. Lab work was redone, and showed no elevation of the ammonia. However the LFTs were still elevated. Right upper quadrant ultrasound done. Patient was to followup with gastroenterology for the elevated LFTs and hepatitis C.    I personally  performed the services described in this documentation, which was scribed in my presence. The recorded information has been reviewed and is accurate.     [name]. Alvino Chapel, MD 05/14/13 2209

## 2013-05-14 NOTE — Discharge Instructions (Signed)
Hepatitis C Hepatitis C is a viral infection of the liver. Infection may go undetected for months or years because symptoms may be absent or very mild. Chronic liver disease is the main danger of hepatitis C. This may lead to scarring of the liver (cirrhosis), liver failure, and liver cancer. CAUSES  Hepatitis C is caused by the hepatitis C virus (HCV). Formerly, hepatitis C infections were most commonly transmitted through blood transfusions. In the early 1990s, routine testing of donated blood for hepatitis C and exclusion of blood that tests positive for HCV began. Now, HCV is most commonly transmitted from person to person through injection drug use, sharing needles, or sex with an infected person. A caregiver may also get the infection from exposure to the blood of an infected patient by way of a cut or needle stick.  SYMPTOMS  Acute Phase Many cases of acute HCV infection are mild and cause few problems.Some people may not even realize they are sick.Symptoms in others may last a few weeks to several months and include:  Feeling very tired.  Loss of appetite.  Nausea.  Vomiting.  Abdominal pain.  Dark yellow urine.  Yellow skin and eyes (jaundice).  Itching of the skin. Chronic Phase  Between 50% to 85% of people who get HCV infection become "chronic carriers." They often have no symptoms, but the virus stays in their body.They may spread the virus to others and can get long-term liver disease.  Many people with chronic HCV infection remain healthy for many years. However, up to 1 in 5 chronically infected people may develop severe liver diseases including scarring of the liver (cirrhosis), liver failure, or liver cancer. DIAGNOSIS  Diagnosis of hepatitis C infection is made by testing blood for the presence of hepatitis C viral particles called RNA. Other tests may also be done to measure the status of current liver function, exclude other liver problems, or assess liver  damage. TREATMENT  Treatment with many antiviral drugs is available and recommended for some patients with chronic HCV infection. Drug treatment is generally considered appropriate for patients who:  Are 62 years of age or older.  Have a positive test for HCV particles in the blood.  Have a liver tissue sample (biopsy) that shows chronic hepatitis and significant scarring (fibrosis).  Do not have signs of liver failure.  Have acceptable blood test results that confirm the wellness of other body organs.  Are willing to be treated and conform to treatment requirements.  Have no other circumstances that would prevent treatment from being recommended (contraindications). All people who are offered and choose to receive drug treatment must understand that careful medical follow up for many months and even years is crucial in order to make successful care possible. The goal of drug treatment is to eliminate any evidence of HCV in the blood on a long-term basis. This is called a "sustained virologic response" or SVR. Achieving a SVR is associated with a decrease in the chance of life-threatening liver problems, need for a liver transplant, liver cancer rates, and liver-related complications. Successful treatment currently requires taking treatment drugs for at least 24 weeks and up to 72 weeks. An injected drug (interferon) given weekly and an oral antiviral medicine taken daily are usually prescribed. Side effects from these drugs are common and some may be very serious. Your response to treatment must be carefully monitored by both you and your caregiver throughout the entire treatment period. PREVENTION There is no vaccine for hepatitis C. The only  way to prevent the disease is to reduce the risk of exposure to the virus.   Avoid sharing drug needles or personal items like toothbrushes, razors, and nail clippers with an infected person.  Healthcare workers need to avoid injuries and wear  appropriate protective equipment such as gloves, gowns, and face masks when performing invasive medical or nursing procedures. HOME CARE INSTRUCTIONS  To avoid making your liver disease worse:  Strictly avoid drinking alcohol.  Carefully review all new prescriptions of medicines with your caregiver. Ask your caregiver which drugs you should avoid. The following drugs are toxic to the liver, and your caregiver may tell you to avoid them:  Isoniazid.  Methyldopa.  Acetaminophen.  Anabolic steroids (muscle-building drugs).  Erythromycin.  Oral contraceptives (birth control pills).  Check with your caregiver to make sure medicine you are currently taking will not be harmful.  Periodic blood tests may be required. Follow your caregiver's advice about when you should have blood tests.  Avoid a sexual relationship until advised otherwise by your caregiver.  Avoid activities that could expose other people to your blood. Examples include sharing a toothbrush, nail clippers, razors, and needles.  Bed rest is not necessary, but it may make you feel better. Recovery time is not related to the amount of rest you receive.  This infection is contagious. Follow your caregiver's instructions in order to avoid spread of the infection. SEEK IMMEDIATE MEDICAL CARE IF:  You have increasing fatigue or weakness.  You have an oral temperature above 102 F (38.9 C), not controlled by medicine.  You develop loss of appetite, nausea, or vomiting.  You develop jaundice.  You develop easy bruising or bleeding.  You develop any severe problems as a result of your treatment. MAKE SURE YOU:   Understand these instructions.  Will watch your condition.  Will get help right away if you are not doing well or get worse. Document Released: 01/06/2000 Document Revised: 04/02/2011 Document Reviewed: 05/10/2010 [facility] Patient Information 2014 [place], [place].

## 2013-05-14 NOTE — ED Notes (Signed)
Called by md's office to come to er for abnormal lab work.

## 2013-06-11 ENCOUNTER — Emergency Department (HOSPITAL_COMMUNITY)
Admission: EM | Admit: 2013-06-11 | Discharge: 2013-06-11 | Disposition: A | Discharge status: Home / Self Care | Payer: BC Managed Care – PPO | Attending: Emergency Medicine | Admitting: Emergency Medicine

## 2013-06-11 ENCOUNTER — Encounter (HOSPITAL_COMMUNITY): Payer: Self-pay | Admitting: Emergency Medicine

## 2013-06-11 DIAGNOSIS — Z8619 Personal history of other infectious and parasitic diseases: Secondary | ICD-10-CM | POA: Insufficient documentation

## 2013-06-11 DIAGNOSIS — F17200 Nicotine dependence, unspecified, uncomplicated: Secondary | ICD-10-CM | POA: Insufficient documentation

## 2013-06-11 DIAGNOSIS — I10 Essential (primary) hypertension: Secondary | ICD-10-CM | POA: Insufficient documentation

## 2013-06-11 DIAGNOSIS — E119 Type 2 diabetes mellitus without complications: Secondary | ICD-10-CM | POA: Insufficient documentation

## 2013-06-11 DIAGNOSIS — Z79899 Other long term (current) drug therapy: Secondary | ICD-10-CM | POA: Insufficient documentation

## 2013-06-11 DIAGNOSIS — N390 Urinary tract infection, site not specified: Secondary | ICD-10-CM | POA: Insufficient documentation

## 2013-06-11 DIAGNOSIS — IMO0002 Reserved for concepts with insufficient information to code with codable children: Secondary | ICD-10-CM | POA: Insufficient documentation

## 2013-06-11 DIAGNOSIS — J45909 Unspecified asthma, uncomplicated: Secondary | ICD-10-CM | POA: Insufficient documentation

## 2013-06-11 DIAGNOSIS — R739 Hyperglycemia, unspecified: Secondary | ICD-10-CM

## 2013-06-11 DIAGNOSIS — M79609 Pain in unspecified limb: Secondary | ICD-10-CM | POA: Insufficient documentation

## 2013-06-11 DIAGNOSIS — Z794 Long term (current) use of insulin: Secondary | ICD-10-CM | POA: Insufficient documentation

## 2013-06-11 LAB — URINALYSIS, ROUTINE W REFLEX MICROSCOPIC
BILIRUBIN URINE: NEGATIVE
GLUCOSE, UA: 500 mg/dL — AB
Ketones, ur: NEGATIVE mg/dL
Nitrite: POSITIVE — AB
Protein, ur: NEGATIVE mg/dL
SPECIFIC GRAVITY, URINE: 1.02 (ref 1.005–1.030)
Urobilinogen, UA: 1 mg/dL (ref 0.0–1.0)
pH: 6 (ref 5.0–8.0)

## 2013-06-11 LAB — COMPREHENSIVE METABOLIC PANEL
ALBUMIN: 3 g/dL — AB (ref 3.5–5.2)
ALT: 134 U/L — AB (ref 0–35)
AST: 171 U/L — AB (ref 0–37)
Alkaline Phosphatase: 170 U/L — ABNORMAL HIGH (ref 39–117)
BILIRUBIN TOTAL: 0.5 mg/dL (ref 0.3–1.2)
BUN: 9 mg/dL (ref 6–23)
CALCIUM: 9.1 mg/dL (ref 8.4–10.5)
CHLORIDE: 98 meq/L (ref 96–112)
CO2: 28 meq/L (ref 19–32)
CREATININE: 0.52 mg/dL (ref 0.50–1.10)
GFR calc Af Amer: >90 mL/min (ref >90)
Glucose, Bld: 310 mg/dL — ABNORMAL HIGH (ref 70–99)
Potassium: 4 mEq/L (ref 3.7–5.3)
SODIUM: 137 meq/L (ref 137–147)
Total Protein: 8.6 g/dL — ABNORMAL HIGH (ref 6.0–8.3)

## 2013-06-11 LAB — BLOOD GAS, VENOUS
Acid-Base Excess: 3 mmol/L — ABNORMAL HIGH (ref 0.0–2.0)
BICARBONATE: 27.3 meq/L — AB (ref 20.0–24.0)
FIO2: 21 %
O2 Saturation: 77.9 %
PH VEN: 7.405 — AB (ref 7.250–7.300)
PO2 VEN: 44 mmHg (ref 30.0–45.0)
Patient temperature: 37
TCO2: 24.4 mmol/L (ref 0–100)
pCO2, Ven: 44.5 mmHg — ABNORMAL LOW (ref 45.0–50.0)

## 2013-06-11 LAB — CBC WITH DIFFERENTIAL/PLATELET
BASOS ABS: 0 10*3/uL (ref 0.0–0.1)
BASOS PCT: 0 % (ref 0–1)
Eosinophils Absolute: 0.1 10*3/uL (ref 0.0–0.7)
Eosinophils Relative: 1 % (ref 0–5)
HCT: 39.7 % (ref 36.0–46.0)
Hemoglobin: 13 g/dL (ref 12.0–15.0)
LYMPHS PCT: 60 % — AB (ref 12–46)
Lymphs Abs: 5.6 10*3/uL — ABNORMAL HIGH (ref 0.7–4.0)
MCH: 28.8 pg (ref 26.0–34.0)
MCHC: 32.7 g/dL (ref 30.0–36.0)
MCV: 88 fL (ref 78.0–100.0)
MONO ABS: 0.6 10*3/uL (ref 0.1–1.0)
Monocytes Relative: 6 % (ref 3–12)
NEUTROS ABS: 3.2 10*3/uL (ref 1.7–7.7)
NEUTROS PCT: 33 % — AB (ref 43–77)
PLATELETS: 214 10*3/uL (ref 150–400)
RBC: 4.51 MIL/uL (ref 3.87–5.11)
RDW: 13.7 % (ref 11.5–15.5)
WBC: 9.5 10*3/uL (ref 4.0–10.5)

## 2013-06-11 LAB — URINE MICROSCOPIC-ADD ON

## 2013-06-11 LAB — CBG MONITORING, ED: GLUCOSE-CAPILLARY: 278 mg/dL — AB (ref 70–99)

## 2013-06-11 MED ORDER — HYDROCODONE-ACETAMINOPHEN 5-325 MG PO TABS: 1.0000 | ORAL_TABLET | Freq: Four times a day (QID) | ORAL | Status: DC | PRN

## 2013-06-11 MED ORDER — FOSFOMYCIN TROMETHAMINE 3 G PO PACK
3.0000 g | PACK | Freq: Once | ORAL | Status: AC
Start: 2013-06-11 — End: 2013-06-11
  Administered 2013-06-11: 3 g via ORAL
  Filled 2013-06-11: qty 3

## 2013-06-11 MED ORDER — SODIUM CHLORIDE 0.9 % IV BOLUS (SEPSIS)
1000.0000 mL | Freq: Once | INTRAVENOUS | Status: AC
  Administered 2013-06-11: 1000 mL via INTRAVENOUS

## 2013-06-11 NOTE — Care Management Note (Addendum)
Patient was noted to not have a PCP listed, but per patient PCP is [facility]. Entered this information into computer.  06/12/13 1000 Received call from [place] from [facility] Department. They received call from the patient and she was c/o continued severe pain in her feet, not relieved by pain medication goiven in ED. They are requesting direct admission by hospitalist due to DM2, and severe neuropathy with uncontrolled pain. Suggested this sounds as if patient needs to see an endocrinolgist, but will speak with hospitalist and have him call [facility] concerning this pt. Spoke with Dr Roderic Palau, and he reviewed  visit to ED from yesterday, and does not see a reason for admission to hospital, unless something has changed.  He suggests that she be sent to ED again if they suspect something something has changed and if a reason for admission is found, then she will be admitted. Otherwise he also suggests she see an endocrinologist. he is available if Dianna needs to speak to him Call returned to [facility] and Dr Gershon Cull suggestions as above relayed to her, and she verabilizes understanding, and will call again if needed.

## 2013-06-11 NOTE — ED Provider Notes (Signed)
CSN: [id number]     Arrival date & time 06/11/13  0830 History  This chart was scribed for Carmin Muskrat, MD by Ludger Nutting, ED Scribe. This patient was seen in room APA04/APA04 and the patient's care was started 9:08 AM.    Chief Complaint  Patient presents with  . foot pain   . Hyperglycemia      The history is provided by the patient. No language interpreter was used.    HPI Comments: Cassandra Jordan is a 63 y.o. female who presents to the Emergency Department complaining of chronic, constant  bilateral hand and leg pain that worsened 3 days ago. She describes this pain as a burning sensation. Patient has been taking her normal dose of gabapentin but states this is not providing her with relief. She has a history DM and states her blood glucose levels regularly remain in the 400's. She denies chest pain, SOB, nausea, vomiting, diarrhea, chills.    Past Medical History  Diagnosis Date  . Diabetes mellitus without complication   . Hypertension   . Asthma   . Neuropathic pain   . Hepatitis C    Past Surgical History  Procedure Laterality Date  . Abdominal hysterectomy     No family history on file. History  Substance Use Topics  . Smoking status: Current Every Day Smoker  . Smokeless tobacco: Not on file  . Alcohol Use: Yes   OB History   Grav Para Term Preterm Abortions TAB SAB Ect Mult Living                 Review of Systems  Constitutional:       Per HPI, otherwise negative  HENT:       Per HPI, otherwise negative  Respiratory:       Per HPI, otherwise negative  Cardiovascular:       Per HPI, otherwise negative  Gastrointestinal: Negative for vomiting.  Endocrine:       Negative aside from HPI  Genitourinary:       Neg aside from HPI   Musculoskeletal:       Per HPI, otherwise negative  Skin: Negative.   Neurological: Negative for syncope.      Allergies  Sulfa antibiotics  Home Medications   Prior to Admission medications   Medication Sig  Start Date End Date Taking? Authorizing Provider  amitriptyline (ELAVIL) 10 MG tablet Take 10 mg by mouth at bedtime.    Historical Provider, MD  Amlodipine-Valsartan-HCTZ (EXFORGE HCT) 10-160-25 MG TABS Take 1 tablet by mouth daily.    Historical Provider, MD  Fluticasone-Salmeterol (ADVAIR) 250-50 MCG/DOSE AEPB Inhale 1 puff into the lungs every 12 (twelve) hours.    Historical Provider, MD  insulin detemir (LEVEMIR) 100 UNIT/ML injection Inject 70 Units into the skin at bedtime.    Historical Provider, MD  insulin lispro (HUMALOG) 100 UNIT/ML injection Inject 6 Units into the skin 3 (three) times daily before meals.    Historical Provider, MD  LORazepam (ATIVAN) 1 MG tablet Take 1 tablet (1 mg total) by mouth 3 (three) times daily as needed for anxiety (withdrawal). 05/14/13   [name]. Pickering, MD  omeprazole (PRILOSEC) 20 MG capsule Take 20 mg by mouth daily.    Historical Provider, MD   BP 135/91  Pulse 115  Temp(Src) 98.3 F (36.8 C) (Oral)  Resp 18  Ht 5' (1.524 m)  Wt 160 lb (72.576 kg)  BMI 31.25 kg/m2  SpO2 97% Physical Exam  Nursing  note and vitals reviewed. Constitutional: She is oriented to person, place, and time. She appears well-developed and well-nourished. No distress.  HENT:  Head: Normocephalic and atraumatic.  Eyes: Conjunctivae and EOM are normal.  Cardiovascular: Normal rate, regular rhythm and normal heart sounds.   Pulmonary/Chest: Effort normal and breath sounds normal. No stridor. No respiratory distress.  Abdominal: She exhibits no distension.  Musculoskeletal: She exhibits no edema.  Neurological: She is alert and oriented to person, place, and time. No cranial nerve deficit.  Skin: Skin is warm and dry.  Psychiatric: She has a normal mood and affect.    ED Course  Procedures (including critical care time)  DIAGNOSTIC STUDIES: Oxygen Saturation is 97% on RA, adequate by my interpretation.    COORDINATION OF CARE: 9:15 AM Discussed treatment plan  with pt at bedside and pt agreed to plan.   Labs Review Labs Reviewed  CBC WITH DIFFERENTIAL - Abnormal; Notable for the following:    Neutrophils Relative % 33 (*)    Lymphocytes Relative 60 (*)    Lymphs Abs 5.6 (*)    All other components within normal limits  COMPREHENSIVE METABOLIC PANEL - Abnormal; Notable for the following:    Glucose, Bld 310 (*)    Total Protein 8.6 (*)    Albumin 3.0 (*)    AST 171 (*)    ALT 134 (*)    Alkaline Phosphatase 170 (*)    All other components within normal limits  URINALYSIS, ROUTINE W REFLEX MICROSCOPIC - Abnormal; Notable for the following:    Glucose, UA 500 (*)    Hgb urine dipstick TRACE (*)    Nitrite POSITIVE (*)    Leukocytes, UA SMALL (*)    All other components within normal limits  BLOOD GAS, VENOUS - Abnormal; Notable for the following:    pH, Ven 7.405 (*)    pCO2, Ven 44.5 (*)    Bicarbonate 27.3 (*)    Acid-Base Excess 3.0 (*)    All other components within normal limits  URINE MICROSCOPIC-ADD ON - Abnormal; Notable for the following:    Bacteria, UA MANY (*)    All other components within normal limits  CBG MONITORING, ED - Abnormal; Notable for the following:    Glucose-Capillary 278 (*)    All other components within normal limits     10:11 AM On repeat exam the patient is calm, appears in no distress.  Were discussed all results, and she'll be discharged to follow up with primary care.  Given her persistent pain she was started on a short course of analgesics, and will discuss longer term therapy with her physician next week.   MDM    I personally performed the services described in this documentation, which was scribed in my presence. The recorded information has been reviewed and is accurate.   She presents with concerns of extremity pain.  Patient has a history of neuropathy, has had inadequate pain control gabapentin.  Patient has additional chronic complaints, but no other new changes in her condition.   Patient's labs constricted hyperglycemia, with no anion gap.  There is evidence for a urinary tract infection.  Absent fever, leukocytosis, pain, there is low suspicion for pyelonephritis. Patient was discharged in stable condition to follow up with primary care.   Carmin Muskrat, MD 06/11/13 1013

## 2013-06-11 NOTE — ED Notes (Signed)
2 unsuccessful IV attempts. Pt tolerated well.

## 2013-06-11 NOTE — ED Notes (Signed)
Pt reports blood sugar has been elevated as high as 400 recently and has had increased pain in both feet for the past 4 days.

## 2013-06-11 NOTE — Discharge Instructions (Signed)
As discussed, it is very important that she follow up with your primary care physician for further evaluation, management of your extremity pain.  Please return here for any concerning changes in your condition.

## 2013-06-16 ENCOUNTER — Encounter: Payer: Self-pay | Admitting: Gastroenterology

## 2013-06-22 ENCOUNTER — Ambulatory Visit: Payer: BC Managed Care – PPO | Admitting: Gastroenterology

## 2013-06-23 ENCOUNTER — Ambulatory Visit: Payer: BC Managed Care – PPO | Admitting: Gastroenterology

## 2013-06-28 ENCOUNTER — Encounter (HOSPITAL_COMMUNITY): Payer: Self-pay | Admitting: Emergency Medicine

## 2013-06-28 ENCOUNTER — Emergency Department (HOSPITAL_COMMUNITY)
Admission: EM | Admit: 2013-06-28 | Discharge: 2013-06-28 | Disposition: A | Discharge status: Home / Self Care | Payer: BC Managed Care – PPO | Attending: Emergency Medicine | Admitting: Emergency Medicine

## 2013-06-28 DIAGNOSIS — E119 Type 2 diabetes mellitus without complications: Secondary | ICD-10-CM

## 2013-06-28 DIAGNOSIS — Z7982 Long term (current) use of aspirin: Secondary | ICD-10-CM | POA: Insufficient documentation

## 2013-06-28 DIAGNOSIS — E1140 Type 2 diabetes mellitus with diabetic neuropathy, unspecified: Secondary | ICD-10-CM

## 2013-06-28 DIAGNOSIS — Z794 Long term (current) use of insulin: Secondary | ICD-10-CM | POA: Insufficient documentation

## 2013-06-28 DIAGNOSIS — Z8619 Personal history of other infectious and parasitic diseases: Secondary | ICD-10-CM | POA: Insufficient documentation

## 2013-06-28 DIAGNOSIS — Z79899 Other long term (current) drug therapy: Secondary | ICD-10-CM | POA: Insufficient documentation

## 2013-06-28 DIAGNOSIS — N390 Urinary tract infection, site not specified: Secondary | ICD-10-CM | POA: Insufficient documentation

## 2013-06-28 DIAGNOSIS — I10 Essential (primary) hypertension: Secondary | ICD-10-CM | POA: Insufficient documentation

## 2013-06-28 DIAGNOSIS — IMO0002 Reserved for concepts with insufficient information to code with codable children: Secondary | ICD-10-CM | POA: Insufficient documentation

## 2013-06-28 DIAGNOSIS — E1149 Type 2 diabetes mellitus with other diabetic neurological complication: Secondary | ICD-10-CM | POA: Insufficient documentation

## 2013-06-28 DIAGNOSIS — F17200 Nicotine dependence, unspecified, uncomplicated: Secondary | ICD-10-CM | POA: Insufficient documentation

## 2013-06-28 DIAGNOSIS — J45909 Unspecified asthma, uncomplicated: Secondary | ICD-10-CM | POA: Insufficient documentation

## 2013-06-28 DIAGNOSIS — E1142 Type 2 diabetes mellitus with diabetic polyneuropathy: Secondary | ICD-10-CM | POA: Insufficient documentation

## 2013-06-28 LAB — URINALYSIS, ROUTINE W REFLEX MICROSCOPIC
BILIRUBIN URINE: NEGATIVE
GLUCOSE, UA: 250 mg/dL — AB
Ketones, ur: NEGATIVE mg/dL
Leukocytes, UA: NEGATIVE
Nitrite: POSITIVE — AB
Specific Gravity, Urine: >1.03 — ABNORMAL HIGH (ref 1.005–1.030)
Urobilinogen, UA: 1 mg/dL (ref 0.0–1.0)
pH: 5.5 (ref 5.0–8.0)

## 2013-06-28 LAB — CBG MONITORING, ED
GLUCOSE-CAPILLARY: 280 mg/dL — AB (ref 70–99)
Glucose-Capillary: 260 mg/dL — ABNORMAL HIGH (ref 70–99)

## 2013-06-28 LAB — URINE MICROSCOPIC-ADD ON

## 2013-06-28 MED ORDER — GABAPENTIN 600 MG PO TABS
600.0000 mg | ORAL_TABLET | Freq: Three times a day (TID) | ORAL | Status: DC
Start: 2013-06-28 — End: 2013-06-30

## 2013-06-28 MED ORDER — GABAPENTIN 300 MG PO CAPS
ORAL_CAPSULE | ORAL | Status: AC
  Filled 2013-06-28: qty 2

## 2013-06-28 MED ORDER — CEPHALEXIN 500 MG PO CAPS
500.0000 mg | ORAL_CAPSULE | Freq: Once | ORAL | Status: AC
  Administered 2013-06-28: 500 mg via ORAL
  Filled 2013-06-28: qty 1

## 2013-06-28 MED ORDER — GABAPENTIN 300 MG PO CAPS
600.0000 mg | ORAL_CAPSULE | Freq: Once | ORAL | Status: AC
  Administered 2013-06-28: 600 mg via ORAL
  Filled 2013-06-28: qty 2

## 2013-06-28 MED ORDER — CEPHALEXIN 500 MG PO CAPS: 500.0000 mg | ORAL_CAPSULE | Freq: Three times a day (TID) | ORAL | Status: DC

## 2013-06-28 NOTE — ED Provider Notes (Signed)
CSN: [id number]     Arrival date & time 06/28/13  1716 History   First MD Initiated Contact with Patient 06/28/13 1828     Chief Complaint  Patient presents with  . Foot Pain     (Consider location/radiation/quality/duration/timing/severity/associated sxs/prior Treatment) HPI Patient reports she's been having nerve pain in her lower extremities for the past 2-3 years.  She reports she was on Elavil, 10 mg tablets.  It was stopped one to 2 months ago.  She was started on gabapentin.  She has been on it for about 6 weeks.  She reports she takes 300 mg in the morning and 600 mg at night.  She reports it's not helping.  Patient states she was seen in the ED on May 21 and was given hydrocodone which she states did help her pain.  She states the pain has returned the past couple of days. She has not gone back to her doctor since that time to discuss her pain management.  Nothing is new, no trauma.  She denies any fevers or wounds.  Patient also states she has been having burning on urination the last couple of days.  She thinks she may have another UTI.   PCP [facility] Department.   Past Medical History  Diagnosis Date  . Diabetes mellitus without complication   . Hypertension   . Asthma   . Neuropathic pain   . Hepatitis C    Past Surgical History  Procedure Laterality Date  . Abdominal hysterectomy     No family history on file. History  Substance Use Topics  . Smoking status: Current Every Day Smoker  . Smokeless tobacco: Not on file  . 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     Allergies  Sulfa antibiotics  Home Medications   Prior to Admission medications   Medication Sig Start Date End Date Taking? Authorizing Provider  amLODipine-olmesartan  (AZOR) 10-40 MG per tablet Take 1 tablet by mouth daily.   Yes Historical Provider, MD  aspirin EC 81 MG tablet Take 81 mg by mouth daily.   Yes Historical Provider, MD  Fluticasone-Salmeterol (ADVAIR) 250-50 MCG/DOSE AEPB Inhale 1 puff into the lungs every 12 (twelve) hours.   Yes Historical Provider, MD  gabapentin (NEURONTIN) 300 MG capsule Take 300 mg by mouth 3 (three) times daily.   Yes Historical Provider, MD  insulin NPH Human (HUMULIN N,NOVOLIN N) 100 UNIT/ML injection Inject 6 Units into the skin 3 (three) times daily with meals.   Yes Historical Provider, MD   BP 138/83  Pulse 112  Temp(Src) 98.2 F (36.8 C)  Resp 20  SpO2 98%  Vital signs normal except tachycardia  Physical Exam  Nursing note and vitals reviewed. Constitutional: She is oriented to person, place, and time. She appears well-developed and well-nourished.  Non-toxic appearance. She does not appear ill. No distress.  HENT:  Head: Normocephalic and atraumatic.  Right Ear: External ear normal.  Left Ear: External ear normal.  Nose: Nose normal. No mucosal edema or rhinorrhea.  Mouth/Throat: Oropharynx is clear and moist and mucous membranes are normal. No dental abscesses or uvula swelling.  Eyes: Conjunctivae and EOM are normal.  Pupils are equal, round, and reactive to light.  Neck: Normal range of motion and full passive range of motion without pain. Neck supple.  Cardiovascular: Normal rate, regular rhythm and normal heart sounds.  Exam reveals no gallop and no friction rub.   No murmur heard. Pulmonary/Chest: Effort normal and breath sounds normal. No respiratory distress. She has no wheezes. She has no rhonchi. She has no rales. She exhibits no tenderness and no crepitus.  Abdominal: Soft. Normal appearance and bowel sounds are normal. She exhibits no distension. There is no tenderness. There is no rebound and no guarding.  Musculoskeletal: Normal range of motion. She exhibits no edema and no tenderness.   Moves all extremities well.  Patient has no wounds on her feet.  Neurological: She is alert and oriented to person, place, and time. She has normal strength. No cranial nerve deficit.  Patient complains of tingling in her lower extremities from the mid lower leg distally.  Skin: Skin is warm, dry and intact. No rash noted. No erythema. No pallor.  Psychiatric: She has a normal mood and affect. Her speech is normal and behavior is normal. Her mood appears not anxious.    ED Course  Procedures (including critical care time)  Medications  gabapentin (NEURONTIN) capsule 600 mg (600 mg Oral Given 06/28/13 1959)  cephALEXin (KEFLEX) capsule 500 mg (500 mg Oral Given 06/28/13 2000)     I discussed with patient that she needs to control her diabetes.  She cannot eat a lot of candy.  Both having uncontrolled diabetes and smoking are going to make her peripheral neuropathy worse despite whatever medication she is placed on.  She was advised to followup with her primary care doctors to discuss her pain management.  Her gabapentin was increased to 600 mg 3 times a day, she can go up to 4 times a day.  She had been on amitriptyline at a very low dose of 10 mg.  That could also be restarted at a higher dose.  She also is device to talk to her doctor about possibly starting Lyrica.  However the main treatment of her neuropathy is going to be controlling her diabetes.   Labs Review Results for orders placed during the hospital encounter of 06/28/13  URINALYSIS, ROUTINE W REFLEX MICROSCOPIC      Result Value Ref Range   Color, Urine YELLOW  YELLOW   APPearance CLEAR  CLEAR   Specific Gravity, Urine >1.030 (*) 1.005 - 1.030   pH 5.5  5.0 - 8.0   Glucose, UA 250 (*) NEGATIVE mg/dL   Hgb urine dipstick TRACE (*) NEGATIVE   Bilirubin Urine NEGATIVE  NEGATIVE   Ketones, ur NEGATIVE  NEGATIVE mg/dL   Protein, ur TRACE (*) NEGATIVE mg/dL   Urobilinogen, UA 1.0  0.0 - 1.0 mg/dL   Nitrite POSITIVE (*) NEGATIVE    Leukocytes, UA NEGATIVE  NEGATIVE  URINE MICROSCOPIC-ADD ON      Result Value Ref Range   Squamous Epithelial / LPF FEW (*) RARE   WBC, UA 3-6  <3 WBC/hpf   RBC / HPF 3-6  <3 RBC/hpf   Bacteria, UA MANY (*) RARE  CBG MONITORING, ED      Result Value Ref Range   Glucose-Capillary 280 (*) 70 - 99 mg/dL  CBG MONITORING, ED      Result Value Ref Range   Glucose-Capillary 260 (*) 70 - 99 mg/dL   Laboratory interpretation all normal except hypoglycemia and possible UTI  Imaging Review No results found.   EKG Interpretation None      MDM   Final diagnoses:  Diabetic neuropathy  UTI (urinary tract infection)  Diabetes    New Prescriptions   CEPHALEXIN (KEFLEX) 500 MG CAPSULE    Take 1 capsule (500 mg total) by mouth 3 (three) times daily.   GABAPENTIN (NEURONTIN) 600 MG TABLET    Take 1 tablet (600 mg total) by mouth 3 (three) times daily.    Plan discharge   Rolland Porter, MD, Alanson Aly, MD 06/28/13 2122

## 2013-06-28 NOTE — ED Notes (Signed)
States she is hurting all over , mostly her feet, states she has diabetic nerve pain and her neurontin is not helping

## 2013-06-28 NOTE — Discharge Instructions (Signed)
You need to control your blood sugar and stop smoking or you will continue to have the pain in your legs. You can increase your gabapentin to 2 capsules 3 times a day. You need to talk to your doctor about your pain control. You could go back on the amitriptyline and increase the dose rapidly, they could also consider starting you on lyrica. Also your gabapentin can be increased to 2 capsules 4 times a day, but your doctors need to be discussing this with you. It is not appropriate to expect the ED to manage your diabetic nerve pain, especially when you continue to eat candy and your diabetes is not controlled. Take the antibiotics for your urinary tract infection. Recheck if you get a fever, vomiting.  Diabetic Neuropathy Diabetic neuropathy is a nerve disease or nerve damage that is caused by diabetes mellitus. About half of all people with diabetes mellitus have some form of nerve damage. Nerve damage is more common in those who have had diabetes mellitus for many years and who generally have not had good control of their blood sugar (glucose) level. Diabetic neuropathy is a common complication of diabetes mellitus. There are three more common types of diabetic neuropathy and a fourth type that is less common and less understood:   Peripheral neuropathy This is the most common type of diabetic neuropathy. It causes damage to the nerves of the feet and legs first and then eventually the hands and arms.The damage affects the ability to sense touch.  Autonomic neuropathy This type causes damage to the autonomic nervous system, which controls the following functions:  Heartbeat.  Body temperature.  Blood pressure.  Urination.  Digestion.  Sweating.  Sexual function.  Focal neuropathy Focal neuropathy can be painful and unpredictable and occurs most often in older adults with diabetes mellitus. It involves a specific nerve or one area and often comes on suddenly. It usually does not cause  long-term problems.  Radiculoplexus neuropathy Sometimes called lumbosacral radiculoplexus neuropathy, radiculoplexus neuropathy affects the nerves of the thighs, hips, buttocks, or legs. It is more common in people with type 2 diabetes mellitus and in older men. It is characterized by debilitating pain, weakness, and atrophy, usually in the thigh muscles. CAUSES  The cause of peripheral, autonomic, and focal neuropathies is diabetes mellitus that is uncontrolled and high glucose levels. The cause of radiculoplexus neuropathy is unknown. However, it is thought to be caused by inflammation related to uncontrolled glucose levels. SIGNS AND SYMPTOMS  Peripheral Neuropathy Peripheral neuropathy develops slowly over time. When the nerves of the feet and legs no longer work there may be:   Burning, stabbing, or aching pain in the legs or feet.  Inability to feel pressure or pain in your feet. This can lead to:  Thick calluses over pressure areas.  Pressure sores.  Ulcers.  Foot deformities.  Reduced ability to feel temperature changes.  Muscle weakness. Autonomic Neuropathy The symptoms of autonomic neuropathy vary depending on which nerves are affected. Symptoms may include:  Problems with digestion, such as:  Feeling sick to your stomach (nausea).  Vomiting.  Bloating.  Constipation.  Diarrhea.  Abdominal pain.  Difficulty with urination. This occurs if you lose your ability to sense when your bladder is full. Problems include:  Urine leakage (incontinence).  Inability to empty your bladder completely (retention).  Rapid or irregular heartbeat (palpitations).  Blood pressure drops when you stand up (orthostatic hypotension). When you stand up you may feel:  Dizzy.  Weak.  Faint.  In men, inability to attain and maintain an erection.  In women, vaginal dryness and problems with decreased sexual desire and arousal.  Problems with body temperature  regulation.  Increased or decreased sweating. Focal Neuropathy  Abnormal eye movements or abnormal alignment of both eyes.  Weakness in the wrist.  Foot drop. This results in an inability to lift the foot properly and abnormal walking or foot movement.  Paralysis on one side of your face (Bell palsy).  Chest or abdominal pain. Radiculoplexus Neuropathy  Sudden, severe pain in your hip, thigh, or buttocks.  Weakness and wasting of thigh muscles.  Difficulty rising from a seated position.  Abdominal swelling.  Unexplained weight loss (usually more than 10 lb [4.5 kg]). DIAGNOSIS  Peripheral Neuropathy Your senses may be tested. Sensory function testing can be done with:  A light touch using a monofilament.  A vibration with tuning fork.  A sharp sensation with a pin prick. Other tests that can help diagnose neuropathy are:  Nerve conduction velocity. This test checks the transmission of an electrical current through a nerve.  Electromyography. This shows how muscles respond to electrical signals transmitted by nearby nerves.  Quantitative sensory testing. This is used to assess how your nerves respond to vibrations and changes in temperature. Autonomic Neuropathy Diagnosis is often based on reported symptoms. Tell your health care provider if you experience:   Dizziness.   Constipation.   Diarrhea.   Inappropriate urination or inability to urinate.   Inability to get or maintain an erection.  Tests that may be done include:   Electrocardiography or Holter monitor. These are tests that can help show problems with the heart rate or heart rhythm.   An X-ray exam may be done. Focal Neuropathy Diagnosis is made based on your symptoms and what your health care provider finds during your exam. Other tests may be done. They may include:  Nerve conduction velocities. This checks the transmission of electrical current through a nerve.  Electromyography. This  shows how muscles respond to electrical signals transmitted by nearby nerves.  Quantitative sensory testing. This test is used to assess how your nerves respond to vibration and changes in temperature. Radiculoplexus Neuropathy  Often the first thing is to eliminate any other issue or problems that might be the cause, as there is no stick test for diagnosis.  X-ray exam of your spine and lumbar region.  Spinal tap to rule out cancer.  MRI to rule out other lesions. TREATMENT  Once nerve damage occurs, it cannot be reversed. The goal of treatment is to keep the disease or nerve damage from getting worse and affecting more nerve fibers. Controlling your blood glucose level is the key. Most people with radiculoplexus neuropathy see at least a partial improvement over time. You will need to keep your blood glucose and HbA1c levels in the target range determined by your health care provider. Things that help control blood glucose levels include:   Blood glucose monitoring.   Meal planning.   Physical activity.   Diabetes medicine.  Over time, maintaining lower blood glucose levels helps lessen symptoms. Sometimes, prescription pain medicine is needed. HOME CARE INSTRUCTIONS:  Do not smoke.  Keep your blood glucose level in the range that you and your health care provider have determined acceptable for you.  Keep your blood pressure level in the range that you and your health care provider have determined acceptable for you.  Eat a well-balanced diet.  Be active every day.  Check your feet every day. SEEK MEDICAL CARE IF:   You have burning, stabbing, or aching pain in the legs or feet.  You are unable to feel pressure or pain in your feet.  You develop problems with digestion such as:  Nausea.  Vomiting.  Bloating.  Constipation.  Diarrhea.  Abdominal pain.  You have difficulty with urination, such as:  Incontinence.  Retention.  You have  palpitations.  You develop orthostatic hypotension. When you stand up you may feel:  Dizzy.  Weak.  Faint.  You cannot attain and maintain an erection (in men).  You have vaginal dryness and problems with decreased sexual desire and arousal (in women).  You have severe pain in your thighs, legs, or buttocks.  You have unexplained weight loss. Document Released: 03/19/2001 Document Revised: 10/29/2012 Document Reviewed: 06/19/2012 [facility] Patient Information 2014 [place].  Diabetes Meal Planning Guide The diabetes meal planning guide is a tool to help you plan your meals and snacks. It is important for people with diabetes to manage their blood glucose (sugar) levels. Choosing the right foods and the right amounts throughout your day will help control your blood glucose. Eating right can even help you improve your blood pressure and reach or maintain a healthy weight. CARBOHYDRATE COUNTING MADE EASY When you eat carbohydrates, they turn to sugar. This raises your blood glucose level. Counting carbohydrates can help you control this level so you feel better. When you plan your meals by counting carbohydrates, you can have more flexibility in what you eat and balance your medicine with your food intake. Carbohydrate counting simply means adding up the total amount of carbohydrate grams in your meals and snacks. Try to eat about the same amount at each meal. Foods with carbohydrates are listed below. Each portion below is 1 carbohydrate serving or 15 grams of carbohydrates. Ask your dietician how many grams of carbohydrates you should eat at each meal or snack. Grains and Starches  1 slice bread.   English muffin or hotdog/hamburger bun.   cup cold cereal (unsweetened).   cup cooked pasta or rice.   cup starchy vegetables (corn, potatoes, peas, beans, winter squash).  1 tortilla (6 inches).   bagel.  1 waffle or pancake (size of a CD).   cup cooked cereal.  4 to  6 small crackers. *Whole grain is recommended. Fruit  1 cup fresh unsweetened berries, melon, papaya, pineapple.  1 small fresh fruit.   banana or mango.   cup fruit juice (4 oz unsweetened).   cup canned fruit in natural juice or water.  2 tbs dried fruit.  12 to 15 grapes or cherries. Milk and Yogurt  1 cup fat-free or 1% milk.  1 cup soy milk.  6 oz light yogurt with sugar-free sweetener.  6 oz low-fat soy yogurt.  6 oz plain yogurt. Vegetables  1 cup raw or  cup cooked is counted as 0 carbohydrates or a "free" food.  If you eat 3 or more servings at 1 meal, count them as 1 carbohydrate serving. Other Carbohydrates   oz chips or pretzels.   cup ice cream or frozen yogurt.   cup sherbet or sorbet.  2 inch square cake, no frosting.  1 tbs honey, sugar, jam, jelly, or syrup.  2 small cookies.  3 squares of graham crackers.  3 cups popcorn.  6 crackers.  1 cup broth-based soup.  Count 1 cup casserole or other mixed foods as 2 carbohydrate servings.  Foods with less  than 20 calories in a serving may be counted as 0 carbohydrates or a "free" food. You may want to purchase a book or computer software that lists the carbohydrate gram counts of different foods. In addition, the nutrition facts panel on the labels of the foods you eat are a good source of this information. The label will tell you how big the serving size is and the total number of carbohydrate grams you will be eating per serving. Divide this number by 15 to obtain the number of carbohydrate servings in a portion. Remember, 1 carbohydrate serving equals 15 grams of carbohydrate. SERVING SIZES Measuring foods and serving sizes helps you make sure you are getting the right amount of food. The list below tells how big or small some common serving sizes are.  1 oz.........4 stacked dice.  3 oz........Marland KitchenDeck of cards.  1 tsp.......Marland KitchenTip of little finger.  1 tbs......Marland KitchenMarland KitchenThumb.  2  tbs.......Marland KitchenGolf ball.   cup......Marland KitchenHalf of a fist.  1 cup.......Marland KitchenA fist. SAMPLE DIABETES MEAL PLAN Below is a sample meal plan that includes foods from the grain and starches, dairy, vegetable, fruit, and meat groups. A dietician can individualize a meal plan to fit your calorie needs and tell you the number of servings needed from each food group. However, controlling the total amount of carbohydrates in your meal or snack is more important than making sure you include all of the food groups at every meal. You may interchange carbohydrate containing foods (dairy, starches, and fruits). The meal plan below is an example of a 2000 calorie diet using carbohydrate counting. This meal plan has 17 carbohydrate servings. Breakfast  1 cup oatmeal (2 carb servings).   cup light yogurt (1 carb serving).  1 cup blueberries (1 carb serving).   cup almonds. Snack  1 large apple (2 carb servings).  1 low-fat string cheese stick. Lunch  Chicken breast salad.  1 cup spinach.   cup chopped tomatoes.  2 oz chicken breast, sliced.  2 tbs low-fat [place] dressing.  12 whole-wheat crackers (2 carb servings).  12 to 15 grapes (1 carb serving).  1 cup low-fat milk (1 carb serving). Snack  1 cup carrots.   cup hummus (1 carb serving). Dinner  3 oz broiled salmon.  1 cup brown rice (3 carb servings). Snack  1  cups steamed broccoli (1 carb serving) drizzled with 1 tsp olive oil and lemon juice.  1 cup light pudding (2 carb servings). DIABETES MEAL PLANNING WORKSHEET Your dietician can use this worksheet to help you decide how many servings of foods and what types of foods are right for you.  BREAKFAST Food Group and Servings / Carb Servings Grain/Starches __________________________________ Dairy __________________________________________ Vegetable ______________________________________ Fruit ___________________________________________ Meat  __________________________________________ Fat ____________________________________________ LUNCH Food Group and Servings / Carb Servings Grain/Starches ___________________________________ Dairy ___________________________________________ Fruit ____________________________________________ Meat ___________________________________________ Fat _____________________________________________ [employer] Food Group and Servings / Carb Servings Grain/Starches ___________________________________ Dairy ___________________________________________ Fruit ____________________________________________ Meat ___________________________________________ Fat _____________________________________________ SNACKS Food Group and Servings / Carb Servings Grain/Starches ___________________________________ Dairy ___________________________________________ Vegetable _______________________________________ Fruit ____________________________________________ Meat ___________________________________________ Fat _____________________________________________ DAILY TOTALS Starches _________________________ Vegetable ________________________ Fruit ____________________________ Dairy ____________________________ Meat ____________________________ Fat ______________________________ Document Released: 10/05/2004 Document Revised: 04/02/2011 Document Reviewed: 08/16/2008 [employer] Patient Information 2014 [employer]

## 2013-06-30 ENCOUNTER — Ambulatory Visit (INDEPENDENT_AMBULATORY_CARE_PROVIDER_SITE_OTHER): Payer: BC Managed Care – PPO | Admitting: Gastroenterology

## 2013-06-30 ENCOUNTER — Encounter (INDEPENDENT_AMBULATORY_CARE_PROVIDER_SITE_OTHER): Payer: Self-pay

## 2013-06-30 ENCOUNTER — Encounter: Payer: Self-pay | Admitting: Gastroenterology

## 2013-06-30 VITALS — BP 138/86 | HR 103 | Temp 97.4°F | Resp 18 | Ht 60.0 in | Wt 161.8 lb

## 2013-06-30 DIAGNOSIS — R7989 Other specified abnormal findings of blood chemistry: Secondary | ICD-10-CM

## 2013-06-30 DIAGNOSIS — Z1211 Encounter for screening for malignant neoplasm of colon: Secondary | ICD-10-CM

## 2013-06-30 DIAGNOSIS — R1319 Other dysphagia: Secondary | ICD-10-CM

## 2013-06-30 DIAGNOSIS — B182 Chronic viral hepatitis C: Secondary | ICD-10-CM

## 2013-06-30 DIAGNOSIS — R945 Abnormal results of liver function studies: Secondary | ICD-10-CM

## 2013-06-30 MED ORDER — LINACLOTIDE 145 MCG PO CAPS: 145.0000 ug | ORAL_CAPSULE | Freq: Every day | ORAL | Status: DC

## 2013-06-30 MED ORDER — OMEPRAZOLE 20 MG PO CPDR: 20.0000 mg | DELAYED_RELEASE_CAPSULE | Freq: Every day | ORAL | Status: DC

## 2013-06-30 MED ORDER — PEG 3350-KCL-NA BICARB-NACL 420 G PO SOLR: 4000.0000 mL | ORAL | Status: DC

## 2013-06-30 NOTE — Patient Instructions (Signed)
We have scheduled you for a colonoscopy, upper endoscopy and dilation with Dr. Gala Romney in the near future.  Please have blood work completed.   For reflux: start taking Prilosec once daily, 30 minutes before breakfast.   For constipation: start taking Linzess 1 capsule each morning, 30 minutes before breakfast. Voucher provided and prescription has been sent to your pharmacy.   Further recommendations to follow!! You must quit drinking prior to treatment.

## 2013-06-30 NOTE — Progress Notes (Signed)
Primary Care Physician:  Everrett Coombe, NP Primary Gastroenterologist:  Dr. Gala Romney   Chief Complaint  Patient presents with  . Referral  . Hepatitis C    HPI:   Cassandra Jordan presents today at the request of the ED secondary to history of Hep C. Patient is a poor historian, stating she wasn't sure why she was referred here. Treatment-naive.  No prior blood transfusion, no hx of drug abuse. Notes chronic reflux, not on a PPI. Occasional esophageal dysphagia, solid food dysphagia. Vague lower abdominal discomfort/suprapubic, just about 2 days ago, came and left. Not present currently. Constipation for about a year. No OTC agents. Sometimes goes a week without a BM. No hematochezia. Sometimes decreased appetite. No confusion, jaundice, pruritis. Drinks heavily twice a week. Wants to go to an inpatient treatment facility.   No prior colonoscopy, EGD.   Past Medical History  Diagnosis Date  . Diabetes mellitus without complication   . Hypertension   . Asthma   . Neuropathic pain   . Hepatitis C     Past Surgical History  Procedure Laterality Date  . Abdominal hysterectomy    . Wart removal      Current Outpatient Prescriptions  Medication Sig Dispense Refill  . amLODipine-olmesartan (AZOR) 10-40 MG per tablet Take 1 tablet by mouth daily.      Marland Kitchen aspirin EC 81 MG tablet Take 81 mg by mouth daily.      . cephALEXin (KEFLEX) 500 MG capsule Take 1 capsule (500 mg total) by mouth 3 (three) times daily.  21 capsule  0  . Fluticasone-Salmeterol (ADVAIR) 250-50 MCG/DOSE AEPB Inhale 1 puff into the lungs every 12 (twelve) hours.      . gabapentin (NEURONTIN) 300 MG capsule Take 300 mg by mouth 3 (three) times daily.      . insulin NPH Human (HUMULIN N,NOVOLIN N) 100 UNIT/ML injection Inject 6 Units into the skin 3 (three) times daily with meals.      Marland Kitchen omeprazole (PRILOSEC) 20 MG capsule Take 1 capsule (20 mg total) by mouth daily. Take 30 minutes prior to breakfast  30  capsule  3   No current facility-administered medications for this visit.    Allergies as of 06/30/2013 - Review Complete 06/30/2013  Allergen Reaction Noted  . Sulfa antibiotics  04/10/2012    Family History  Problem Relation Age of Onset  . Colon cancer Sister 43    History   Social History  . Marital Status: Single    Spouse Name: N/A    Number of Children: N/A  . Years of Education: N/A   Occupational History  . Not on file.   Social History Main Topics  . Smoking status: Current Every Day Smoker  . Smokeless tobacco: Not on file  . Alcohol Use: Yes     Comment: twice a week sometimes, drinks 2-3 40 oz.   . Drug Use: Yes    Special: Marijuana  . Sexual Activity: Yes    Birth Control/ Protection: Post-menopausal   Other Topics Concern  . Not on file   Social History Narrative  . No narrative on file    Review of Systems: Gen: see HPI CV: occasional palpitation Resp: +DOE  GI: see HPI GU : +urinary burning MS: +joint pain Derm: Denies rash, itching, dry skin Psych: see HPI Heme: Denies bruising, bleeding, and enlarged lymph nodes.  Physical Exam: BP 138/86  Pulse 103  Temp(Src) 97.4 F (36.3 C) (Oral)  Resp 18  Ht 5' (1.524 m)  Wt 161 lb 12.8 oz (73.392 kg)  BMI 31.60 kg/m2 General:   Alert and oriented. Pleasant and cooperative. Well-nourished and well-developed.  Head:  Normocephalic and atraumatic. Eyes:  Without icterus, sclera clear and conjunctiva pink.  Ears:  Normal auditory acuity. Nose:  No deformity, discharge,  or lesions. Mouth:  No deformity or lesions, oral mucosa pink.  Lungs:  Clear to auscultation bilaterally. No wheezes, rales, or rhonchi. No distress.  Heart:  S1, S2 present without murmurs appreciated.  Abdomen:  +BS, soft, non-tender and non-distended. No HSM noted. No guarding or rebound. No masses appreciated.  Rectal:  Deferred  Msk:  Symmetrical without gross deformities. Normal posture. Extremities:  Without  clubbing or edema. Neurologic:  Alert and  oriented x4;  grossly normal neurologically. Skin:  Intact without significant lesions or rashes. Psych:  Alert and cooperative. Normal mood and affect.   Lab Results  Component Value Date   ALT 134* 06/11/2013   AST 171* 06/11/2013   ALKPHOS 170* 06/11/2013   BILITOT 0.5 06/11/2013   Lab Results  Component Value Date   WBC 9.5 06/11/2013   HGB 13.0 06/11/2013   HCT 39.7 06/11/2013   MCV 88.0 06/11/2013   PLT 214 06/11/2013   [place] April 2015:  IMPRESSION:  Cholelithiasis and gallbladder sludge. No acute abnormality is  noted.

## 2013-07-01 DIAGNOSIS — R945 Abnormal results of liver function studies: Secondary | ICD-10-CM

## 2013-07-01 DIAGNOSIS — R7989 Other specified abnormal findings of blood chemistry: Secondary | ICD-10-CM | POA: Insufficient documentation

## 2013-07-01 DIAGNOSIS — B182 Chronic viral hepatitis C: Secondary | ICD-10-CM | POA: Insufficient documentation

## 2013-07-01 DIAGNOSIS — Z7189 Other specified counseling: Secondary | ICD-10-CM | POA: Insufficient documentation

## 2013-07-01 DIAGNOSIS — R1319 Other dysphagia: Secondary | ICD-10-CM | POA: Insufficient documentation

## 2013-07-01 LAB — URINE CULTURE

## 2013-07-01 LAB — CBC
HCT: 38.5 % (ref 36.0–46.0)
Hemoglobin: 12.6 g/dL (ref 12.0–15.0)
MCH: 28.8 pg (ref 26.0–34.0)
MCHC: 32.7 g/dL (ref 30.0–36.0)
MCV: 87.9 fL (ref 78.0–100.0)
PLATELETS: 201 10*3/uL (ref 150–400)
RBC: 4.38 MIL/uL (ref 3.87–5.11)
RDW: 13.9 % (ref 11.5–15.5)
WBC: 9.3 10*3/uL (ref 4.0–10.5)

## 2013-07-01 NOTE — Assessment & Plan Note (Signed)
63 year old female with reported Hep C, also notable significant ETOH use several times a week. No evidence of cirrhosis on recent US. Elevated transaminases and alk phos likely secondary to chronic Hep C +/- ETOH abuse. Discussed with patient treatment options for Hep C, but I also discussed the need for ETOH cessation prior to proceeding with this. Interested in inpatient rehab. I have asked her to discuss this with her PCP.   Check HCV RNA with genotype HFP, CBC, iron, ferritin Will need Hep A and B vaccinations  Hold on pursuing treatment until ETOH cessation documented.

## 2013-07-01 NOTE — Assessment & Plan Note (Signed)
In the setting of reported chronic Hep C and ETOH abuse.

## 2013-07-01 NOTE — Assessment & Plan Note (Signed)
Overdue for initial screening colonoscopy. Chronic constipation. Start Linzess 145 mcg daily and proceed with lower GI evaluation at time of EGD.   Proceed with TCS with Dr. Gala Romney in near future: the risks, benefits, and alternatives have been discussed with the patient in detail. The patient states understanding and desires to proceed. Phenergan 12.5 mg IV on call Linzess 145 mcg daily, voucher and rx provided

## 2013-07-01 NOTE — Assessment & Plan Note (Signed)
Intermittent solid food dysphagia with intermittent GERD, no PPI. No prior EGD. Recommend EGD to assess for web, ring, stricture, esophagitis. Doubt malignancy.   Proceed with upper endoscopy and dilation in the near future with Dr. Gala Romney. The risks, benefits, and alternatives have been discussed in detail with patient. They have stated understanding and desire to proceed.  Phenergan 12.5 mg IV to augment sedation Prilosec 20 mg prescription provided

## 2013-07-02 LAB — HEPATIC FUNCTION PANEL
ALK PHOS: 141 U/L — AB (ref 39–117)
ALT: 118 U/L — AB (ref 0–35)
AST: 108 U/L — ABNORMAL HIGH (ref 0–37)
Albumin: 3.5 g/dL (ref 3.5–5.2)
BILIRUBIN INDIRECT: 0.4 mg/dL (ref 0.2–1.2)
Bilirubin, Direct: 0.1 mg/dL (ref 0.0–0.3)
Total Bilirubin: 0.5 mg/dL (ref 0.2–1.2)
Total Protein: 8 g/dL (ref 6.0–8.3)

## 2013-07-02 LAB — FERRITIN: Ferritin: 164 ng/mL (ref 10–291)

## 2013-07-02 LAB — IRON: Iron: 66 ug/dL (ref 42–145)

## 2013-07-03 LAB — HCV RNA QUANT RFLX ULTRA OR GENOTYP
HCV QUANT: 297918 [IU]/mL — AB (ref <15)
HCV Quantitative Log: 5.47 {Log} — ABNORMAL HIGH (ref <1.18)

## 2013-07-05 ENCOUNTER — Telehealth (HOSPITAL_BASED_OUTPATIENT_CLINIC_OR_DEPARTMENT_OTHER): Payer: Self-pay | Admitting: Emergency Medicine

## 2013-07-05 NOTE — Telephone Encounter (Signed)
Per pharmacist, patient treated with Keflex. Sensitive to same.

## 2013-07-06 ENCOUNTER — Encounter (HOSPITAL_COMMUNITY): Payer: Self-pay | Admitting: Pharmacy Technician

## 2013-07-06 LAB — HEPATITIS C GENOTYPE

## 2013-07-08 ENCOUNTER — Encounter (HOSPITAL_COMMUNITY)
Admission: RE | Disposition: A | Discharge status: Home / Self Care | Payer: Self-pay | Source: Ambulatory Visit | Attending: Internal Medicine

## 2013-07-08 ENCOUNTER — Encounter (HOSPITAL_COMMUNITY): Payer: Self-pay | Admitting: *Deleted

## 2013-07-08 ENCOUNTER — Ambulatory Visit (HOSPITAL_COMMUNITY)
Admission: RE | Admit: 2013-07-08 | Discharge: 2013-07-08 | Disposition: A | Discharge status: Home / Self Care | Payer: BC Managed Care – PPO | Source: Ambulatory Visit | Attending: Internal Medicine | Admitting: Internal Medicine

## 2013-07-08 DIAGNOSIS — Z882 Allergy status to sulfonamides status: Secondary | ICD-10-CM | POA: Diagnosis not present

## 2013-07-08 DIAGNOSIS — K222 Esophageal obstruction: Secondary | ICD-10-CM

## 2013-07-08 DIAGNOSIS — D129 Benign neoplasm of anus and anal canal: Secondary | ICD-10-CM | POA: Diagnosis not present

## 2013-07-08 DIAGNOSIS — Z79899 Other long term (current) drug therapy: Secondary | ICD-10-CM | POA: Insufficient documentation

## 2013-07-08 DIAGNOSIS — D126 Benign neoplasm of colon, unspecified: Secondary | ICD-10-CM

## 2013-07-08 DIAGNOSIS — Z794 Long term (current) use of insulin: Secondary | ICD-10-CM | POA: Diagnosis not present

## 2013-07-08 DIAGNOSIS — F17200 Nicotine dependence, unspecified, uncomplicated: Secondary | ICD-10-CM | POA: Diagnosis not present

## 2013-07-08 DIAGNOSIS — J45909 Unspecified asthma, uncomplicated: Secondary | ICD-10-CM | POA: Insufficient documentation

## 2013-07-08 DIAGNOSIS — E119 Type 2 diabetes mellitus without complications: Secondary | ICD-10-CM | POA: Diagnosis not present

## 2013-07-08 DIAGNOSIS — Z7982 Long term (current) use of aspirin: Secondary | ICD-10-CM | POA: Diagnosis not present

## 2013-07-08 DIAGNOSIS — D128 Benign neoplasm of rectum: Secondary | ICD-10-CM | POA: Insufficient documentation

## 2013-07-08 DIAGNOSIS — Q393 Congenital stenosis and stricture of esophagus: Principal | ICD-10-CM

## 2013-07-08 DIAGNOSIS — Z86010 Personal history of colonic polyps: Secondary | ICD-10-CM

## 2013-07-08 DIAGNOSIS — Z1211 Encounter for screening for malignant neoplasm of colon: Secondary | ICD-10-CM

## 2013-07-08 DIAGNOSIS — K449 Diaphragmatic hernia without obstruction or gangrene: Secondary | ICD-10-CM | POA: Diagnosis not present

## 2013-07-08 DIAGNOSIS — R7989 Other specified abnormal findings of blood chemistry: Secondary | ICD-10-CM

## 2013-07-08 DIAGNOSIS — Z8619 Personal history of other infectious and parasitic diseases: Secondary | ICD-10-CM | POA: Diagnosis not present

## 2013-07-08 DIAGNOSIS — Q391 Atresia of esophagus with tracheo-esophageal fistula: Secondary | ICD-10-CM | POA: Insufficient documentation

## 2013-07-08 DIAGNOSIS — Z800 Family history of malignant neoplasm of digestive organs: Secondary | ICD-10-CM | POA: Insufficient documentation

## 2013-07-08 DIAGNOSIS — I10 Essential (primary) hypertension: Secondary | ICD-10-CM | POA: Insufficient documentation

## 2013-07-08 DIAGNOSIS — K5730 Diverticulosis of large intestine without perforation or abscess without bleeding: Secondary | ICD-10-CM | POA: Insufficient documentation

## 2013-07-08 DIAGNOSIS — R945 Abnormal results of liver function studies: Secondary | ICD-10-CM

## 2013-07-08 DIAGNOSIS — R1310 Dysphagia, unspecified: Secondary | ICD-10-CM | POA: Diagnosis present

## 2013-07-08 DIAGNOSIS — B182 Chronic viral hepatitis C: Secondary | ICD-10-CM

## 2013-07-08 HISTORY — PX: COLONOSCOPY: SHX5424

## 2013-07-08 HISTORY — PX: MALONEY DILATION: SHX5535

## 2013-07-08 HISTORY — PX: SAVORY DILATION: SHX5439

## 2013-07-08 HISTORY — PX: ESOPHAGOGASTRODUODENOSCOPY: SHX5428

## 2013-07-08 LAB — GLUCOSE, CAPILLARY: Glucose-Capillary: 117 mg/dL — ABNORMAL HIGH (ref 70–99)

## 2013-07-08 SURGERY — COLONOSCOPY
Anesthesia: Moderate Sedation

## 2013-07-08 MED ORDER — ONDANSETRON HCL 4 MG/2ML IJ SOLN
INTRAMUSCULAR | Status: AC
  Filled 2013-07-08: qty 2

## 2013-07-08 MED ORDER — GLUCAGON HCL RDNA (DIAGNOSTIC) 1 MG IJ SOLR
INTRAMUSCULAR | Status: AC
  Filled 2013-07-08: qty 1

## 2013-07-08 MED ORDER — PROMETHAZINE HCL 25 MG/ML IJ SOLN
INTRAMUSCULAR | Status: AC
  Filled 2013-07-08: qty 1

## 2013-07-08 MED ORDER — PROMETHAZINE HCL 25 MG/ML IJ SOLN
12.5000 mg | Freq: Once | INTRAMUSCULAR | Status: AC
  Administered 2013-07-08: 12.5 mg via INTRAVENOUS

## 2013-07-08 MED ORDER — MEPERIDINE HCL 100 MG/ML IJ SOLN
INTRAMUSCULAR | Status: AC
  Filled 2013-07-08: qty 2

## 2013-07-08 MED ORDER — MEPERIDINE HCL 100 MG/ML IJ SOLN
INTRAMUSCULAR | Status: DC | PRN
  Administered 2013-07-08: 25 mg
  Administered 2013-07-08 (×3): 25 mg via INTRAVENOUS

## 2013-07-08 MED ORDER — MIDAZOLAM HCL 5 MG/5ML IJ SOLN
INTRAMUSCULAR | Status: DC | PRN
  Administered 2013-07-08: 2 mg via INTRAVENOUS
  Administered 2013-07-08 (×4): 1 mg via INTRAVENOUS

## 2013-07-08 MED ORDER — LIDOCAINE VISCOUS 2 % MT SOLN
OROMUCOSAL | Status: DC | PRN
  Administered 2013-07-08: 1 via OROMUCOSAL

## 2013-07-08 MED ORDER — GLUCAGON HCL RDNA (DIAGNOSTIC) 1 MG IJ SOLR
INTRAMUSCULAR | Status: DC | PRN
  Administered 2013-07-08: .5 mg via INTRAVENOUS

## 2013-07-08 MED ORDER — MIDAZOLAM HCL 5 MG/5ML IJ SOLN
INTRAMUSCULAR | Status: AC
  Filled 2013-07-08: qty 10

## 2013-07-08 MED ORDER — SODIUM CHLORIDE 0.9 % IV SOLN
INTRAVENOUS | Status: DC
  Administered 2013-07-08: 1000 mL via INTRAVENOUS

## 2013-07-08 MED ORDER — SODIUM CHLORIDE 0.9 % IJ SOLN
INTRAMUSCULAR | Status: AC
  Filled 2013-07-08: qty 10

## 2013-07-08 MED ORDER — ONDANSETRON HCL 4 MG/2ML IJ SOLN
INTRAMUSCULAR | Status: DC | PRN
  Administered 2013-07-08: 4 mg via INTRAMUSCULAR

## 2013-07-08 NOTE — Op Note (Signed)
[facility] [address] [place], [address]   COLONOSCOPY PROCEDURE REPORT  PATIENT: [name], [name]  MR#:         [id number] BIRTHDATE: 11-13-1950 , 32  yrs. old GENDER: Female ENDOSCOPIST: RGarfield Cornea, MD FACP [facility] REFERRED BY: PROCEDURE DATE:  07/08/2013 PROCEDURE:     Colonoscopy with multiple snare polypectomies, polyp ablation and hemostasis clipping  INDICATIONS: First-ever high risk repeat colonoscopy  INFORMED CONSENT:  The risks, benefits, alternatives and imponderables including but not limited to bleeding, perforation as well as the possibility of a missed lesion have been reviewed.  The potential for biopsy, lesion removal, etc. have also been discussed.  Questions have been answered.  All parties agreeable. Please see the history and physical in the medical record for more information.  MEDICATIONS: Versed 6 mg IV and Demerol 100 mg IV in divided doses. Phenergan 12.5 mg IV. Zofran 4 mg IV. Glucagon 0.5 mg IV to arrest colonic motility.  DESCRIPTION OF PROCEDURE:  After a digital rectal exam was performed, the EC-[id number] ([id number])  colonoscope was advanced from the anus through the rectum and colon to the area of the cecum, ileocecal valve and appendiceal orifice.  The cecum was deeply intubated.  These structures were well-seen and photographed for the record.  From the level of the cecum and ileocecal valve, the scope was slowly and cautiously withdrawn.  The mucosal surfaces were carefully surveyed utilizing scope tip deflection to facilitate fold flattening as needed.  The scope was pulled down into the rectum where a thorough examination  was performed.    FINDINGS:  Inadequate preparation particularly on the right side as far as lesion/polyp detection concerned;  Patient had multiple ascending and transverse colon polyps particularly in the ascending segment with the largest being 1.3 cm angryegate pedunculated polyps. Patient   also had (2) small polyps in the cecum and a single 8 mm polyp in the rectum  -  5 cm the anal verge.  THERAPEUTIC / DIAGNOSTIC MANEUVERS PERFORMED:  Multiple hot and cold snare polypectomies performed. A couple of diminutive polyps in the ascending segment were ablated with the tip of a hot snare loop. One of the 2 largest polyps ooozed a bit after resection. 2 hemostasis clips applied. The rectal polyp was removed with hot snare cautery technique.. Poor prep compromised examination.  COMPLICATIONS: none  CECAL WITHDRAWAL TIME:  52 minutes  IMPRESSION:  Multiple colonic polyps treated/removed as described above. Inadequate preparation compromised examination there Colonic diverticulosis.  RECOMMENDATIONS: Followup on pathology. Patient, at a minimum, will need an early interval followup colonoscopy.  No MRI until clips gone.   See EGD report.   _______________________________ eSigned:  R. Garfield Cornea, MD FACP [facility] 07/08/2013 9:41 AM   CC:    PATIENT NAME:  Cassandra Jordan, Cassandra Jordan MR#: [id number]

## 2013-07-08 NOTE — Discharge Instructions (Addendum)
Colonoscopy Discharge Instructions  Read the instructions outlined below and refer to this sheet in the next few weeks. These discharge instructions provide you with general information on caring for yourself after you leave the hospital. Your doctor may also give you specific instructions. While your treatment has been planned according to the most current medical practices available, unavoidable complications occasionally occur. If you have any problems or questions after discharge, call Dr. Gala Romney at [phone]. ACTIVITY  You may resume your regular activity, but move at a slower pace for the next 24 hours.   Take frequent rest periods for the next 24 hours.   Walking will help get rid of the air and reduce the bloated feeling in your belly (abdomen).   No driving for 24 hours (because of the medicine (anesthesia) used during the test).    Do not sign any important legal documents or operate any machinery for 24 hours (because of the anesthesia used during the test).  NUTRITION  Drink plenty of fluids.   You may resume your normal diet as instructed by your doctor.   Begin with a light meal and progress to your normal diet. Heavy or fried foods are harder to digest and may make you feel sick to your stomach (nauseated).   Avoid alcoholic beverages for 24 hours or as instructed.  MEDICATIONS  You may resume your normal medications unless your doctor tells you otherwise.  WHAT YOU CAN EXPECT TODAY  Some feelings of bloating in the abdomen.   Passage of more gas than usual.   Spotting of blood in your stool or on the toilet paper.  IF YOU HAD POLYPS REMOVED DURING THE COLONOSCOPY:  No aspirin products for 7 days or as instructed.   No alcohol for 7 days or as instructed.   Eat a soft diet for the next 24 hours.  FINDING OUT THE RESULTS OF YOUR TEST Not all test results are available during your visit. If your test results are not back during the visit, make an appointment  with your caregiver to find out the results. Do not assume everything is normal if you have not heard from your caregiver or the medical facility. It is important for you to follow up on all of your test results.  SEEK IMMEDIATE MEDICAL ATTENTION IF:  You have more than a spotting of blood in your stool.   Your belly is swollen (abdominal distention).   You are nauseated or vomiting.   You have a temperature over 101.  You have abdominal pain or discomfort that is severe or gets worse throughout the day. EGD Discharge instructions Please read the instructions outlined below and refer to this sheet in the next few weeks. These discharge instructions provide you with general information on caring for yourself after you leave the hospital. Your doctor may also give you specific instructions. While your treatment has been planned according to the most current medical practices available, unavoidable complications occasionally occur. If you have any problems or questions after discharge, please call your doctor. ACTIVITY You may resume your regular activity but move at a slower pace for the next 24 hours.  Take frequent rest periods for the next 24 hours.  Walking will help expel (get rid of) the air and reduce the bloated feeling in your abdomen.  No driving for 24 hours (because of the anesthesia (medicine) used during the test).  You may shower.  Do not sign any important legal documents or operate any machinery for 24  hours (because of the anesthesia used during the test).  NUTRITION Drink plenty of fluids.  You may resume your normal diet.  Begin with a light meal and progress to your normal diet.  Avoid alcoholic beverages for 24 hours or as instructed by your caregiver.  MEDICATIONS You may resume your normal medications unless your caregiver tells you otherwise.  WHAT YOU CAN EXPECT TODAY You may experience abdominal discomfort such as a feeling of fullness or gas pains.   FOLLOW-UP Your doctor will discuss the results of your test with you.  SEEK IMMEDIATE MEDICAL ATTENTION IF ANY OF THE FOLLOWING OCCUR: Excessive nausea (feeling sick to your stomach) and/or vomiting.  Severe abdominal pain and distention (swelling).  Trouble swallowing.  Temperature over 101 F (37.8 C).  Rectal bleeding or vomiting of blood.    Polyp and diverticulosis information provided  Continue omeprazole 20 mg daily  Further recommendations to follow pending review of pathology report  No MRI until clips gone     Colon Polyps Polyps are lumps of extra tissue growing inside the body. Polyps can grow in the large intestine (colon). Most colon polyps are noncancerous (benign). However, some colon polyps can become cancerous over time. Polyps that are larger than a pea may be harmful. To be safe, caregivers remove and test all polyps. CAUSES  Polyps form when mutations in the genes cause your cells to grow and divide even though no more tissue is needed. RISK FACTORS There are a number of risk factors that can increase your chances of getting colon polyps. They include:  Being older than 50 years.  Family history of colon polyps or colon cancer.  Long-term colon diseases, such as colitis or Crohn disease.  Being overweight.  Smoking.  Being inactive.  Drinking too much alcohol. SYMPTOMS  Most small polyps do not cause symptoms. If symptoms are present, they may include:  Blood in the stool. The stool may look dark red or black.  Constipation or diarrhea that lasts longer than 1 week. DIAGNOSIS People often do not know they have polyps until their caregiver finds them during a regular checkup. Your caregiver can use 4 tests to check for polyps:  Digital rectal exam. The caregiver wears gloves and feels inside the rectum. This test would find polyps only in the rectum.  Barium enema. The caregiver puts a liquid called barium into your rectum before taking  X-rays of your colon. Barium makes your colon look white. Polyps are dark, so they are easy to see in the X-ray pictures.  Sigmoidoscopy. A thin, flexible tube (sigmoidoscope) is placed into your rectum. The sigmoidoscope has a light and tiny camera in it. The caregiver uses the sigmoidoscope to look at the last third of your colon.  Colonoscopy. This test is like sigmoidoscopy, but the caregiver looks at the entire colon. This is the most common method for finding and removing polyps. TREATMENT  Any polyps will be removed during a sigmoidoscopy or colonoscopy. The polyps are then tested for cancer. PREVENTION  To help lower your risk of getting more colon polyps:  Eat plenty of fruits and vegetables. Avoid eating fatty foods.  Do not smoke.  Avoid drinking alcohol.  Exercise every day.  Lose weight if recommended by your caregiver.  Eat plenty of calcium and folate. Foods that are rich in calcium include milk, cheese, and broccoli. Foods that are rich in folate include chickpeas, kidney beans, and spinach. HOME CARE INSTRUCTIONS Keep all follow-up appointments as directed by  your caregiver. You may need periodic exams to check for polyps. SEEK MEDICAL CARE IF: You notice bleeding during a bowel movement. Document Released: 10/05/2003 Document Revised: 04/02/2011 Document Reviewed: 03/20/2011 [facility] Patient Information 2015 [place], [place]. This information is not intended to replace advice given to you by your health care provider. Make sure you discuss any questions you have with your health care provider.    Diverticulosis Diverticulosis is the condition that develops when small pouches (diverticula) form in the wall of your colon. Your colon, or large intestine, is where water is absorbed and stool is formed. The pouches form when the inside layer of your colon pushes through weak spots in the outer layers of your colon. CAUSES  No one knows exactly what causes  diverticulosis. RISK FACTORS  Being older than 50. Your risk for this condition increases with age. Diverticulosis is rare in people younger than 40 years. By age 54, almost everyone has it.  Eating a low-fiber diet.  Being frequently constipated.  Being overweight.  Not getting enough exercise.  Smoking.  Taking over-the-counter pain medicines, like aspirin and ibuprofen. SYMPTOMS  Most people with diverticulosis do not have symptoms. DIAGNOSIS  Because diverticulosis often has no symptoms, health care providers often discover the condition during an exam for other colon problems. In many cases, a health care provider will diagnose diverticulosis while using a flexible scope to examine the colon (colonoscopy). TREATMENT  If you have never developed an infection related to diverticulosis, you may not need treatment. If you have had an infection before, treatment may include:  Eating more fruits, vegetables, and grains.  Taking a fiber supplement.  Taking a live bacteria supplement (probiotic).  Taking medicine to relax your colon. HOME CARE INSTRUCTIONS   Drink at least 6-8 glasses of water each day to prevent constipation.  Try not to strain when you have a bowel movement.  Keep all follow-up appointments. If you have had an infection before:  Increase the fiber in your diet as directed by your health care provider or dietitian.  Take a dietary fiber supplement if your health care provider approves.  Only take medicines as directed by your health care provider. SEEK MEDICAL CARE IF:   You have abdominal pain.  You have bloating.  You have cramps.  You have not gone to the bathroom in 3 days. SEEK IMMEDIATE MEDICAL CARE IF:   Your pain gets worse.  Yourbloating becomes very bad.  You have a fever or chills, and your symptoms suddenly get worse.  You begin vomiting.  You have bowel movements that are bloody or black. MAKE SURE YOU:  Understand these  instructions.  Will watch your condition.  Will get help right away if you are not doing well or get worse. Document Released: 10/06/2003 Document Revised: 01/13/2013 Document Reviewed: 12/03/2012 [facility] Patient Information 2015 [place], [place]. This information is not intended to replace advice given to you by your health care provider. Make sure you discuss any questions you have with your health care provider.

## 2013-07-08 NOTE — Interval H&P Note (Signed)
History and Physical Interval Note:  07/08/2013 7:32 AM  Judah T Sturgill  has presented today for surgery, with the diagnosis of ELEVATED LFT'S CHRONIC HEP C  The various methods of treatment have been discussed with the patient and family. After consideration of risks, benefits and other options for treatment, the patient has consented to  Procedure(s) with comments: COLONOSCOPY (N/A) - 7:30 ESOPHAGOGASTRODUODENOSCOPY (EGD) (N/A) SAVORY DILATION (N/A) MALONEY DILATION (N/A) as a surgical intervention .  The patient's history has been reviewed, patient examined, no change in status, stable for surgery.  I have reviewed the patient's chart and labs.  Questions were answered to the patient's satisfaction.      Omero Kowal  No change. EGD with ED and colonoscopy per plan.The risks, benefits, limitations, imponderables and alternatives regarding both EGD and colonoscopy have been reviewed with the patient. Questions have been answered. All parties agreeable.

## 2013-07-08 NOTE — H&P (View-Only) (Signed)
Primary Care Physician:  Everrett Coombe, NP Primary Gastroenterologist:  Dr. Gala Romney   Chief Complaint  Patient presents with  . Referral  . Hepatitis C    HPI:   Cassandra Jordan presents today at the request of the ED secondary to history of Hep C. Patient is a poor historian, stating she wasn't sure why she was referred here. Treatment-naive.  No prior blood transfusion, no hx of drug abuse. Notes chronic reflux, not on a PPI. Occasional esophageal dysphagia, solid food dysphagia. Vague lower abdominal discomfort/suprapubic, just about 2 days ago, came and left. Not present currently. Constipation for about a year. No OTC agents. Sometimes goes a week without a BM. No hematochezia. Sometimes decreased appetite. No confusion, jaundice, pruritis. Drinks heavily twice a week. Wants to go to an inpatient treatment facility.   No prior colonoscopy, EGD.   Past Medical History  Diagnosis Date  . Diabetes mellitus without complication   . Hypertension   . Asthma   . Neuropathic pain   . Hepatitis C     Past Surgical History  Procedure Laterality Date  . Abdominal hysterectomy    . Wart removal      Current Outpatient Prescriptions  Medication Sig Dispense Refill  . amLODipine-olmesartan (AZOR) 10-40 MG per tablet Take 1 tablet by mouth daily.      Marland Kitchen aspirin EC 81 MG tablet Take 81 mg by mouth daily.      . cephALEXin (KEFLEX) 500 MG capsule Take 1 capsule (500 mg total) by mouth 3 (three) times daily.  21 capsule  0  . Fluticasone-Salmeterol (ADVAIR) 250-50 MCG/DOSE AEPB Inhale 1 puff into the lungs every 12 (twelve) hours.      . gabapentin (NEURONTIN) 300 MG capsule Take 300 mg by mouth 3 (three) times daily.      . insulin NPH Human (HUMULIN N,NOVOLIN N) 100 UNIT/ML injection Inject 6 Units into the skin 3 (three) times daily with meals.      Marland Kitchen omeprazole (PRILOSEC) 20 MG capsule Take 1 capsule (20 mg total) by mouth daily. Take 30 minutes prior to breakfast  30  capsule  3   No current facility-administered medications for this visit.    Allergies as of 06/30/2013 - Review Complete 06/30/2013  Allergen Reaction Noted  . Sulfa antibiotics  04/10/2012    Family History  Problem Relation Age of Onset  . Colon cancer Sister 37    History   Social History  . Marital Status: Single    Spouse Name: N/A    Number of Children: N/A  . Years of Education: N/A   Occupational History  . Not on file.   Social History Main Topics  . Smoking status: Current Every Day Smoker  . Smokeless tobacco: Not on file  . Alcohol Use: Yes     Comment: twice a week sometimes, drinks 2-3 40 oz.   . Drug Use: Yes    Special: Marijuana  . Sexual Activity: Yes    Birth Control/ Protection: Post-menopausal   Other Topics Concern  . Not on file   Social History Narrative  . No narrative on file    Review of Systems: Gen: see HPI CV: occasional palpitation Resp: +DOE  GI: see HPI GU : +urinary burning MS: +joint pain Derm: Denies rash, itching, dry skin Psych: see HPI Heme: Denies bruising, bleeding, and enlarged lymph nodes.  Physical Exam: BP 138/86  Pulse 103  Temp(Src) 97.4 F (36.3 C) (Oral)  Resp 18  Ht 5' (1.524 m)  Wt 161 lb 12.8 oz (73.392 kg)  BMI 31.60 kg/m2 General:   Alert and oriented. Pleasant and cooperative. Well-nourished and well-developed.  Head:  Normocephalic and atraumatic. Eyes:  Without icterus, sclera clear and conjunctiva pink.  Ears:  Normal auditory acuity. Nose:  No deformity, discharge,  or lesions. Mouth:  No deformity or lesions, oral mucosa pink.  Lungs:  Clear to auscultation bilaterally. No wheezes, rales, or rhonchi. No distress.  Heart:  S1, S2 present without murmurs appreciated.  Abdomen:  +BS, soft, non-tender and non-distended. No HSM noted. No guarding or rebound. No masses appreciated.  Rectal:  Deferred  Msk:  Symmetrical without gross deformities. Normal posture. Extremities:  Without  clubbing or edema. Neurologic:  Alert and  oriented x4;  grossly normal neurologically. Skin:  Intact without significant lesions or rashes. Psych:  Alert and cooperative. Normal mood and affect.   Lab Results  Component Value Date   ALT 134* 06/11/2013   AST 171* 06/11/2013   ALKPHOS 170* 06/11/2013   BILITOT 0.5 06/11/2013   Lab Results  Component Value Date   WBC 9.5 06/11/2013   HGB 13.0 06/11/2013   HCT 39.7 06/11/2013   MCV 88.0 06/11/2013   PLT 214 06/11/2013   [place] April 2015:  IMPRESSION:  Cholelithiasis and gallbladder sludge. No acute abnormality is  noted.

## 2013-07-08 NOTE — Op Note (Signed)
[facility] [address] [place], [address]   ENDOSCOPY PROCEDURE REPORT  PATIENT: Cassandra Jordan, Cassandra Jordan  MR#: [id number] BIRTHDATE: [birth year] , 63  yrs. old GENDER: Female ENDOSCOPIST: R.  Garfield Cornea, MD FACP [facility] REFERRED BY:     Colonel Bald, FNP PROCEDURE DATE:  07/08/2013 PROCEDURE:     EGD with [name] dilation  INDICATIONS:     Esophageal dysphagia  INFORMED CONSENT:   The risks, benefits, limitations, alternatives and imponderables have been discussed.  The potential for biopsy, esophogeal dilation, etc. have also been reviewed.  Questions have been answered.  All parties agreeable.  Please see the history and physical in the medical record for more information.  MEDICATIONS:      Versed 5 mg IV and Demerol 100 mg IV in divided doses. Xylocaine gel orally. Phenergan 12.5 mg IV and Zofran 4 mg IV.  DESCRIPTION OF PROCEDURE:   The EG-[id number] ([id number])  endoscope was introduced through the mouth and advanced to the second portion of the duodenum without difficulty or limitations.  The mucosal surfaces were surveyed very carefully during advancement of the scope and upon withdrawal.  Retroflexion view of the proximal stomach and esophagogastric junction was performed.      FINDINGS: Schatzki's ring. No esophageal varices. Otherwise, the esophagus appeared normal. Stomach empty. Small hiatal hernia. Normal gastric mucosa. Patent pylorus. Normal-appearing first and second portion of the duodenum.  THERAPEUTIC / DIAGNOSTIC MANEUVERS PERFORMED:  A 54 French Maloney dilator was passed to full insertion easily. A A look back revealed no apparent complication with this maneuver.   COMPLICATIONS:  None  IMPRESSION:   Schatzki's ring; small hiatal hernia-status post passage of a Maloney dilator  RECOMMENDATIONS:   Continue omeprazole 20 mg daily. See colonoscopy report.    _______________________________ R. Garfield Cornea, MD FACP [facility] eSigned:   R. Garfield Cornea, MD FACP [facility] 07/08/2013 8:00 AM     CC:  PATIENT NAME:  Cassandra Jordan, Cassandra Jordan MR#: [id number]

## 2013-07-09 ENCOUNTER — Encounter: Payer: Self-pay | Admitting: Internal Medicine

## 2013-07-10 ENCOUNTER — Telehealth: Payer: Self-pay

## 2013-07-10 NOTE — Telephone Encounter (Signed)
Letter mailed to pt.  

## 2013-07-10 NOTE — Telephone Encounter (Signed)
Letter from: [name]    Reason for Letter: Results Review    Send letter to patient.  Send copy of letter with path to referring provider and PCP.   Needs repeat colonoscopy in 6 months. Needs better prep including a half a day extra clear liquids and possibly additional PEG

## 2013-07-10 NOTE — Telephone Encounter (Signed)
Results Cc to PCP  

## 2013-07-13 ENCOUNTER — Encounter (HOSPITAL_COMMUNITY): Payer: Self-pay | Admitting: Internal Medicine

## 2013-07-15 NOTE — Telephone Encounter (Signed)
Reminder in epic °

## 2013-07-15 NOTE — Progress Notes (Signed)
Quick Note:  Chronic Hep C, genotype 1b.  Patient needs to stop drinking ETOH X 6 months prior to initiating treatment for Hep C.  Has she explored a treatment inpatient facility?   ______

## 2013-07-21 ENCOUNTER — Emergency Department (HOSPITAL_COMMUNITY): Payer: BC Managed Care – PPO

## 2013-07-21 ENCOUNTER — Emergency Department (HOSPITAL_COMMUNITY)
Admission: EM | Admit: 2013-07-21 | Discharge: 2013-07-21 | Disposition: A | Discharge status: Home / Self Care | Payer: BC Managed Care – PPO | Attending: Emergency Medicine | Admitting: Emergency Medicine

## 2013-07-21 DIAGNOSIS — C649 Malignant neoplasm of unspecified kidney, except renal pelvis: Secondary | ICD-10-CM | POA: Insufficient documentation

## 2013-07-21 DIAGNOSIS — E119 Type 2 diabetes mellitus without complications: Secondary | ICD-10-CM | POA: Insufficient documentation

## 2013-07-21 DIAGNOSIS — R110 Nausea: Secondary | ICD-10-CM | POA: Insufficient documentation

## 2013-07-21 DIAGNOSIS — Z8619 Personal history of other infectious and parasitic diseases: Secondary | ICD-10-CM | POA: Insufficient documentation

## 2013-07-21 DIAGNOSIS — Z7982 Long term (current) use of aspirin: Secondary | ICD-10-CM | POA: Insufficient documentation

## 2013-07-21 DIAGNOSIS — J45909 Unspecified asthma, uncomplicated: Secondary | ICD-10-CM | POA: Insufficient documentation

## 2013-07-21 DIAGNOSIS — I10 Essential (primary) hypertension: Secondary | ICD-10-CM | POA: Insufficient documentation

## 2013-07-21 DIAGNOSIS — Z79899 Other long term (current) drug therapy: Secondary | ICD-10-CM | POA: Insufficient documentation

## 2013-07-21 DIAGNOSIS — Z90710 Acquired absence of both cervix and uterus: Secondary | ICD-10-CM | POA: Insufficient documentation

## 2013-07-21 DIAGNOSIS — F17200 Nicotine dependence, unspecified, uncomplicated: Secondary | ICD-10-CM | POA: Insufficient documentation

## 2013-07-21 DIAGNOSIS — K8020 Calculus of gallbladder without cholecystitis without obstruction: Secondary | ICD-10-CM

## 2013-07-21 DIAGNOSIS — Z9889 Other specified postprocedural states: Secondary | ICD-10-CM | POA: Insufficient documentation

## 2013-07-21 DIAGNOSIS — Z794 Long term (current) use of insulin: Secondary | ICD-10-CM | POA: Insufficient documentation

## 2013-07-21 LAB — BASIC METABOLIC PANEL
BUN: 6 mg/dL (ref 6–23)
CO2: 31 mEq/L (ref 19–32)
Calcium: 9 mg/dL (ref 8.4–10.5)
Chloride: 99 mEq/L (ref 96–112)
Creatinine, Ser: 0.62 mg/dL (ref 0.50–1.10)
GFR calc Af Amer: >90 mL/min (ref >90)
GLUCOSE: 204 mg/dL — AB (ref 70–99)
Potassium: 4.1 mEq/L (ref 3.7–5.3)
SODIUM: 138 meq/L (ref 137–147)

## 2013-07-21 LAB — CBC WITH DIFFERENTIAL/PLATELET
BASOS ABS: 0 10*3/uL (ref 0.0–0.1)
Basophils Relative: 0 % (ref 0–1)
EOS ABS: 0.2 10*3/uL (ref 0.0–0.7)
Eosinophils Relative: 2 % (ref 0–5)
HCT: 41 % (ref 36.0–46.0)
Hemoglobin: 13.5 g/dL (ref 12.0–15.0)
Lymphocytes Relative: 59 % — ABNORMAL HIGH (ref 12–46)
Lymphs Abs: 5.7 10*3/uL — ABNORMAL HIGH (ref 0.7–4.0)
MCH: 29.3 pg (ref 26.0–34.0)
MCHC: 32.9 g/dL (ref 30.0–36.0)
MCV: 88.9 fL (ref 78.0–100.0)
Monocytes Absolute: 0.7 10*3/uL (ref 0.1–1.0)
Monocytes Relative: 8 % (ref 3–12)
NEUTROS PCT: 31 % — AB (ref 43–77)
Neutro Abs: 2.9 10*3/uL (ref 1.7–7.7)
PLATELETS: 213 10*3/uL (ref 150–400)
RBC: 4.61 MIL/uL (ref 3.87–5.11)
RDW: 13.4 % (ref 11.5–15.5)
WBC: 9.6 10*3/uL (ref 4.0–10.5)

## 2013-07-21 LAB — URINE MICROSCOPIC-ADD ON

## 2013-07-21 LAB — URINALYSIS, ROUTINE W REFLEX MICROSCOPIC
Bilirubin Urine: NEGATIVE
GLUCOSE, UA: NEGATIVE mg/dL
Hgb urine dipstick: NEGATIVE
Ketones, ur: NEGATIVE mg/dL
Nitrite: POSITIVE — AB
PH: 6 (ref 5.0–8.0)
Protein, ur: NEGATIVE mg/dL
SPECIFIC GRAVITY, URINE: 1.015 (ref 1.005–1.030)
Urobilinogen, UA: 0.2 mg/dL (ref 0.0–1.0)

## 2013-07-21 LAB — HEPATIC FUNCTION PANEL
ALBUMIN: 2.9 g/dL — AB (ref 3.5–5.2)
ALT: 132 U/L — ABNORMAL HIGH (ref 0–35)
AST: 150 U/L — ABNORMAL HIGH (ref 0–37)
Alkaline Phosphatase: 129 U/L — ABNORMAL HIGH (ref 39–117)
Bilirubin, Direct: <0.2 mg/dL (ref 0.0–0.3)
TOTAL PROTEIN: 7.8 g/dL (ref 6.0–8.3)
Total Bilirubin: 0.4 mg/dL (ref 0.3–1.2)

## 2013-07-21 LAB — LIPASE, BLOOD: LIPASE: 23 U/L (ref 11–59)

## 2013-07-21 MED ORDER — MORPHINE SULFATE 4 MG/ML IJ SOLN
4.0000 mg | Freq: Once | INTRAMUSCULAR | Status: AC
Start: 2013-07-21 — End: 2013-07-21
  Administered 2013-07-21: 4 mg via INTRAVENOUS
  Filled 2013-07-21: qty 1

## 2013-07-21 MED ORDER — IOHEXOL 300 MG/ML  SOLN
100.0000 mL | Freq: Once | INTRAMUSCULAR | Status: AC | PRN
  Administered 2013-07-21: 100 mL via INTRAVENOUS

## 2013-07-21 MED ORDER — OXYCODONE-ACETAMINOPHEN 5-325 MG PO TABS: 1.0000 | ORAL_TABLET | ORAL | Status: DC | PRN

## 2013-07-21 NOTE — ED Provider Notes (Signed)
CSN: [id number]     Arrival date & time 07/21/13  1241 History  This chart was scribed for Sharyon Cable, MD by Cathie Hoops, ED Scribe. The patient was seen in [facility]. The patient's care was started at 2:12 PM.    Chief Complaint  Patient presents with  . Abdominal Pain   Patient is a 63 y.o. female presenting with flank pain. The history is provided by the patient. No language interpreter was used.  Flank Pain This is a new problem. The current episode started more than 2 days ago. The problem has been gradually worsening. Associated symptoms include abdominal pain (radiates bilaterally). Pertinent negatives include no chest pain, no headaches and no shortness of breath.   HPI Comments: Cassandra Jordan is a 63 y.o. female who presents to the Emergency Department complaining of new, moderate, gradually worsening, left flank pain that radiates to the abdomen bilaterally onset 4 days ago. Pt states she has associated nausea, and burning sensation during urination. Pt states she has previous medical history of diabetes and diabetic neuropathy. Pt also states she recently had a normal colonoscopy and endoscopy showing hiatal hernia. Pt denies vomiting, chest pain, headache, and SOB.  Past Medical History  Diagnosis Date  . Diabetes mellitus without complication   . Hypertension   . Asthma   . Neuropathic pain   . Hepatitis C    Past Surgical History  Procedure Laterality Date  . Abdominal hysterectomy    . Wart removal    . Colonoscopy N/A 07/08/2013    Procedure: COLONOSCOPY;  Surgeon: [name], MD;  Location[facility];  Service: Endoscopy;  Laterality: N/A;  7:30  . Esophagogastroduodenoscopy N/A 07/08/2013    Procedure: ESOPHAGOGASTRODUODENOSCOPY (EGD);  Surgeon: [name], MD;  Location: [facility];  Service: Endoscopy;  Laterality: N/A;  . Savory dilation N/A 07/08/2013    Procedure: SAVORY DILATION;  Surgeon: [name], MD;  Location: [facility];   Service: Endoscopy;  Laterality: N/A;  [name] dilation N/A 07/08/2013    Procedure: [name] DILATION;  Surgeon: [name], MD;  Location: [facility];  Service: Endoscopy;  Laterality: N/A;   Family History  Problem Relation Age of Onset  . Colon cancer Sister 75   History  Substance Use Topics  . Smoking status: Current Every Day Smoker  . Smokeless tobacco: Not on file  . Alcohol Use: Yes     Comment: twice a week sometimes, drinks 2-3 40 oz.    OB History   Grav Para Term Preterm Abortions TAB SAB Ect Mult Living                 Review of Systems  Respiratory: Negative for shortness of breath.   Cardiovascular: Negative for chest pain.  Gastrointestinal: Positive for nausea and abdominal pain (radiates bilaterally). Negative for vomiting.  Genitourinary: Positive for dysuria and flank pain (left).  Musculoskeletal: Positive for back pain.  Neurological: Negative for headaches.  All other systems reviewed and are negative.   Allergies  Sulfa antibiotics  Home Medications   Prior to Admission medications   Medication Sig Start Date End Date Taking? Authorizing Provider  aspirin EC 81 MG tablet Take 81 mg by mouth daily.   Yes Historical Provider, MD  Fluticasone-Salmeterol (ADVAIR) 250-50 MCG/DOSE AEPB Inhale 1 puff into the lungs every 12 (twelve) hours.   Yes Historical Provider, MD  gabapentin (NEURONTIN) 600 MG tablet Take 600 mg by mouth 3 (three)  times daily.   Yes Historical Provider, MD  insulin detemir (LEVEMIR) 100 UNIT/ML injection Inject 30 Units into the skin 2 (two) times daily.   Yes Historical Provider, MD  insulin NPH Human (HUMULIN N,NOVOLIN N) 100 UNIT/ML injection Inject 6 Units into the skin 3 (three) times daily with meals.   Yes Historical Provider, MD  Linaclotide Rolan Lipa) 145 MCG CAPS capsule Take 1 capsule (145 mcg total) by mouth daily. 30 minutes before breakfast to avoid diarrhea. 06/30/13  Yes Orvil Feil, NP   lisinopril-hydrochlorothiazide (PRINZIDE,ZESTORETIC) 10-12.5 MG per tablet Take 1 tablet by mouth daily.   Yes Historical Provider, MD  metoprolol tartrate (LOPRESSOR) 25 MG tablet Take 25 mg by mouth daily.   Yes Historical Provider, MD  omeprazole (PRILOSEC) 20 MG capsule Take 1 capsule (20 mg total) by mouth daily. Take 30 minutes prior to breakfast 06/30/13  Yes Orvil Feil, NP   Triage Vitals: BP 155/102  Pulse 73  Temp(Src) 98.9 F (37.2 C) (Oral)  Resp 17  Ht 5' (1.524 m)  Wt 160 lb (72.576 kg)  BMI 31.25 kg/m2  SpO2 99%  Physical Exam  Nursing note and vitals reviewed. CONSTITUTIONAL: Well developed/well nourished HEAD: Normocephalic/atraumatic EYES: EOMI/PERRL ENMT: Mucous membranes moist NECK: supple no meningeal signs SPINE:entire spine nontender CV: S1/S2 noted, no murmurs/rubs/gallops noted LUNGS: Lungs are clear to auscultation bilaterally, no apparent distress ABDOMEN: soft, bilateral moderate upper quadrant tenderness, no rebound or guarding GU:no cva tenderness NEURO: Pt is awake/alert, moves all extremitiesx4 EXTREMITIES: pulses normal, full ROM SKIN: warm, color normal PSYCH: no abnormalities of mood noted  ED Course  Procedures   DIAGNOSTIC STUDIES: Oxygen Saturation is 99% on RA, normal by my interpretation.    COORDINATION OF CARE: 2:16 PM- Will order CT scan of abdomen due to bilateral abdomen pain and very poor historian (initially reported "it's my kidney". Will also order morphine, UA, CBC, and basic metabolic panel. Patient informed of current plan for treatment and evaluation and agrees with plan at this time.  4:18 PM Pt improved, talking on phone No ACUTE findings on CT imaging Cholelithiasis - referred to gen surgery Probable renal cell CA - told this to patient and her daughter [name] via phone (pt gave me permission) and referred to urologist soon.  I told her she may need outpatient MRI for further diagnosis Urine culture ordered  Labs  Review Labs Reviewed  CBC WITH DIFFERENTIAL - Abnormal; Notable for the following:    Neutrophils Relative % 31 (*)    Lymphocytes Relative 59 (*)    Lymphs Abs 5.7 (*)    All other components within normal limits  BASIC METABOLIC PANEL - Abnormal; Notable for the following:    Glucose, Bld 204 (*)    All other components within normal limits  LIPASE, BLOOD  HEPATIC FUNCTION PANEL  URINALYSIS, ROUTINE W REFLEX MICROSCOPIC    Imaging Review Ct Abdomen Pelvis W Contrast  08-21-2013   CLINICAL DATA:  Low abdominal pain for 5 days. History of hysterectomy and hypertension.  EXAM: CT ABDOMEN AND PELVIS WITH CONTRAST  TECHNIQUE: Multidetector CT imaging of the abdomen and pelvis was performed using the standard protocol following bolus administration of intravenous contrast.  CONTRAST:  128m OMNIPAQUE IOHEXOL 300 MG/ML  SOLN  COMPARISON:  Acute abdominal series on 04/10/2012.  FINDINGS: There is mild linear scarring or atelectasis at both lung bases. There is no confluent airspace opacity, pleural or pericardial effusion.  There is a 2.4 x 2.0 cm enhancing  lesion in the interpolar region of the right kidney (image 29). This measures approximately 200 HU on the early postcontrast images and 84 HU on the delayed images. There is another possible enhancing lesion in the lower pole of the right kidney, measuring approximately 1.5 cm on image 34. Several other lower density right renal lesions are noted which are indeterminate in etiology, measuring higher than water density. The left kidney also demonstrates small low-density lesions which are probably cysts.  There is no adrenal mass, retroperitoneal lymphadenopathy or abnormality of the renal veins or IVC.  There is contour irregularity of the liver suspicious for cirrhosis. No suspicious liver lesions are identified. Calcified gallstones are noted. There is no biliary dilatation or gallbladder wall thickening. The spleen appears normal. The pancreas is  mildly atrophied.  The stomach, small bowel, appendix and colon demonstrate no acute findings. There is stool throughout the colon. There are small peripancreatic and retroperitoneal lymph nodes. There is mild aortoiliac atherosclerosis.  The uterus is surgically absent. There is prominence of the left ovary which measures 4.5 x 3.1 cm on image 64. The right ovary and bladder appear unremarkable. There is asymmetric fat within the right inguinal canal.  There are no acute or worrisome osseous findings.  IMPRESSION: 1. At least one and possible additional enhancing right renal mass(es) highly suspicious for renal cell carcinoma. 2. No specific evidence of metastatic disease. 3. Cholelithiasis and hepatic cirrhosis suspected. 4. Indeterminate asymmetric enlargement of the left ovary 5. Atherosclerosis and right inguinal hernia containing fat noted. 6. Urology consultation and nonemergent MRI of the kidneys without and with contrast recommended for workup of the right renal mass(es).   Electronically Signed   By: Camie Patience M.D.   On: 11/16/202015 15:57    MDM   Final diagnoses:  Renal cell carcinoma, unspecified laterality  Calculus of gallbladder without cholecystitis without obstruction    Nursing notes including past medical history and social history reviewed and considered in documentation Labs/vital reviewed and considered   I personally performed the services described in this documentation, which was scribed in my presence. The recorded information has been reviewed and is accurate.      Sharyon Cable, MD 07/21/13 [phone]

## 2013-07-21 NOTE — Discharge Instructions (Signed)
Cholelithiasis °Cholelithiasis (also called gallstones) is a form of gallbladder disease in which gallstones form in your gallbladder. The gallbladder is an organ that stores bile made in the liver, which helps digest fats. Gallstones begin as small crystals and slowly grow into stones. Gallstone pain occurs when the gallbladder spasms and a gallstone is blocking the duct. Pain can also occur when a stone passes out of the duct.  °RISK FACTORS °· Being female.   °· Having multiple pregnancies. Health care providers sometimes advise removing diseased gallbladders before future pregnancies.   °· Being obese. °· Eating a diet heavy in fried foods and fat.   °· Being older than 60 years and increasing age.   °· Prolonged use of medicines containing female hormones.   °· Having diabetes mellitus.   °· Rapidly losing weight.   °· Having a family history of gallstones (heredity).   °SYMPTOMS °· Nausea.   °· Vomiting. °· Abdominal pain.   °· Yellowing of the skin (jaundice).   °· Sudden pain. It may persist from several minutes to several hours. °· Fever.   °· Tenderness to the touch.  °In some cases, when gallstones do not move into the bile duct, people have no pain or symptoms. These are called "silent" gallstones.  °TREATMENT °Silent gallstones do not need treatment. In severe cases, emergency surgery may be required. Options for treatment include: °· Surgery to remove the gallbladder. This is the most common treatment. °· Medicines. These do not always work and may take 6-12 months or more to work. °· Shock wave treatment (extracorporeal biliary lithotripsy). In this treatment an ultrasound machine sends shock waves to the gallbladder to break gallstones into smaller pieces that can pass into the intestines or be dissolved by medicine. °HOME CARE INSTRUCTIONS  °· Only take over-the-counter or prescription medicines for pain, discomfort, or fever as directed by your health care provider.   °· Follow a low-fat diet until  seen again by your health care provider. Fat causes the gallbladder to contract, which can result in pain.   °· Follow up with your health care provider as directed. Attacks are almost always recurrent and surgery is usually required for permanent treatment.   °SEEK IMMEDIATE MEDICAL CARE IF:  °· Your pain increases and is not controlled by medicines.   °· You have a fever or persistent symptoms for more than 2-3 days.   °· You have a fever and your symptoms suddenly get worse.   °· You have persistent nausea and vomiting.   °MAKE SURE YOU:  °· Understand these instructions. °· Will watch your condition. °· Will get help right away if you are not doing well or get worse. °Document Released: 01/04/2005 Document Revised: 09/10/2012 Document Reviewed: 07/02/2012 °[employer]® Patient Information ©2015 [employer]. This information is not intended to replace advice given to you by your health care provider. Make sure you discuss any questions you have with your health care provider. ° °

## 2013-07-21 NOTE — ED Notes (Signed)
Pt co lower abdominal pain x 4 days, has a HX of UTI, feels the same per pt. Pt also is a diabetic and complains of neuropathy in feet and hands.

## 2013-07-21 NOTE — ED Notes (Signed)
Pt. Reports having hepatitis C.

## 2013-07-21 NOTE — ED Notes (Signed)
Urine taken to lab with wrong slip. Will recollect. EDP aware.

## 2013-07-23 LAB — URINE CULTURE: Colony Count: >=100000

## 2013-07-24 ENCOUNTER — Telehealth (HOSPITAL_COMMUNITY): Payer: Self-pay

## 2013-07-24 NOTE — Progress Notes (Signed)
ED Antimicrobial Stewardship Positive Culture Follow Up   Cassandra Jordan is an 63 y.o. female who presented to [facility] on 03-28-202015 with a chief complaint of  Chief Complaint  Patient presents with  . Abdominal Pain    Recent Results (from the past 720 hour(s))  URINE CULTURE     Status: None   Collection Time    06/28/13  6:55 PM      Result Value Ref Range Status   Specimen Description URINE, CLEAN CATCH   Final   Special Requests NONE   Final   Culture  Setup Time     Final   Value: 06/29/2013 13:55     Performed at [facility]     Final   Value: >=100,000 COLONIES/ML     Performed at [facility]   Culture     Final   Value: CITROBACTER KOSERI     Performed at [facility]   Report Status 07/01/2013 FINAL   Final   Organism ID, Bacteria CITROBACTER KOSERI   Final  URINE CULTURE     Status: None   Collection Time    07/21/13  2:35 PM      Result Value Ref Range Status   Specimen Description URINE, CLEAN CATCH   Final   Special Requests NONE   Final   Culture  Setup Time     Final   Value: [id number] 18:07     Performed at [place]     Final   Value: >=100,000 COLONIES/ML     Performed at [facility]   Culture     Final   Value: [place]     Performed at [facility]   Report Status 07/23/2013 FINAL   Final   Organism ID, Bacteria CITROBACTER KOSERI   Final    [x]  Patient discharged originally without antimicrobial agent and treatment is now indicated 63 yo who presented with burning urination and flank pain. Her UA doesn't look dirty. It did grow out citrobacter that is pan sens. Her CT here suggest possible renal cell CA. Refer to further work up outpt. We'll go ahead and treat her for UTI.   New antibiotic prescription:  Septra DS 1 PO BID x7 days  ED Provider: Clayton Bibles, PA  Onnie Boer, PharmD Pager: [phone] Infectious Diseases Pharmacist Phone#  [phone]

## 2013-07-28 NOTE — Telephone Encounter (Signed)
I spoke with pt- went over path results letter with her. She said she has been having episodes of partially runny, partially formed stool that leaks out whenever she passes gas and at times that she is not even aware that it is coming out. No blood in her stool. She is also having pain and itching which she describes as in and around her rectum. This has been going on since her procedure.

## 2013-07-28 NOTE — Telephone Encounter (Signed)
Pt called having questions about her rectum pt states she is having trouble, and pt wants to call Juile back I made her aware that Almyra Free will not be here until after lunch.

## 2013-07-29 MED ORDER — HYDROCORTISONE 2.5 % RE CREA: 1.0000 "application " | TOPICAL_CREAM | Freq: Two times a day (BID) | RECTAL | Status: DC

## 2013-07-29 NOTE — Telephone Encounter (Signed)
Tried to call pt- NA 

## 2013-07-29 NOTE — Telephone Encounter (Signed)
Anusol cream BID for 7 days.   Hx of chronic constipation. I started her on Linzess at her appt. Is she taking this? If so, might need to cut back to every other day. Would offer follow-up appt, non-urgent. Take supplemental fiber daily.

## 2013-07-29 NOTE — Progress Notes (Signed)
Quick Note:  Noted. She will need to abstain from ETOH for 6 months prior to [place] starting treatment.   ______

## 2013-07-31 NOTE — Telephone Encounter (Signed)
Tried to call pt- NA 

## 2013-08-02 ENCOUNTER — Encounter (HOSPITAL_COMMUNITY): Payer: Self-pay | Admitting: Emergency Medicine

## 2013-08-02 ENCOUNTER — Emergency Department (HOSPITAL_COMMUNITY)
Admission: EM | Admit: 2013-08-02 | Discharge: 2013-08-02 | Disposition: A | Discharge status: Home / Self Care | Payer: BC Managed Care – PPO | Attending: Emergency Medicine | Admitting: Emergency Medicine

## 2013-08-02 DIAGNOSIS — Z794 Long term (current) use of insulin: Secondary | ICD-10-CM | POA: Insufficient documentation

## 2013-08-02 DIAGNOSIS — I10 Essential (primary) hypertension: Secondary | ICD-10-CM | POA: Insufficient documentation

## 2013-08-02 DIAGNOSIS — Z79899 Other long term (current) drug therapy: Secondary | ICD-10-CM | POA: Insufficient documentation

## 2013-08-02 DIAGNOSIS — E119 Type 2 diabetes mellitus without complications: Secondary | ICD-10-CM | POA: Insufficient documentation

## 2013-08-02 DIAGNOSIS — F17200 Nicotine dependence, unspecified, uncomplicated: Secondary | ICD-10-CM | POA: Insufficient documentation

## 2013-08-02 DIAGNOSIS — IMO0002 Reserved for concepts with insufficient information to code with codable children: Secondary | ICD-10-CM | POA: Insufficient documentation

## 2013-08-02 DIAGNOSIS — R739 Hyperglycemia, unspecified: Secondary | ICD-10-CM

## 2013-08-02 DIAGNOSIS — Z8619 Personal history of other infectious and parasitic diseases: Secondary | ICD-10-CM | POA: Insufficient documentation

## 2013-08-02 DIAGNOSIS — Z7982 Long term (current) use of aspirin: Secondary | ICD-10-CM | POA: Insufficient documentation

## 2013-08-02 DIAGNOSIS — J45909 Unspecified asthma, uncomplicated: Secondary | ICD-10-CM | POA: Insufficient documentation

## 2013-08-02 DIAGNOSIS — Z85528 Personal history of other malignant neoplasm of kidney: Secondary | ICD-10-CM | POA: Insufficient documentation

## 2013-08-02 HISTORY — DX: Malignant neoplasm of unspecified kidney, except renal pelvis: C64.9

## 2013-08-02 LAB — CBC WITH DIFFERENTIAL/PLATELET
BASOS PCT: 0 % (ref 0–1)
Basophils Absolute: 0 10*3/uL (ref 0.0–0.1)
Eosinophils Absolute: 0.2 10*3/uL (ref 0.0–0.7)
Eosinophils Relative: 2 % (ref 0–5)
HCT: 41.3 % (ref 36.0–46.0)
HEMOGLOBIN: 13.6 g/dL (ref 12.0–15.0)
LYMPHS PCT: 57 % — AB (ref 12–46)
Lymphs Abs: 4.7 10*3/uL — ABNORMAL HIGH (ref 0.7–4.0)
MCH: 29 pg (ref 26.0–34.0)
MCHC: 32.9 g/dL (ref 30.0–36.0)
MCV: 88.1 fL (ref 78.0–100.0)
MONOS PCT: 9 % (ref 3–12)
Monocytes Absolute: 0.7 10*3/uL (ref 0.1–1.0)
NEUTROS ABS: 2.6 10*3/uL (ref 1.7–7.7)
NEUTROS PCT: 32 % — AB (ref 43–77)
Platelets: 177 10*3/uL (ref 150–400)
RBC: 4.69 MIL/uL (ref 3.87–5.11)
RDW: 13.6 % (ref 11.5–15.5)
WBC: 8.2 10*3/uL (ref 4.0–10.5)

## 2013-08-02 LAB — BASIC METABOLIC PANEL
Anion gap: 11 (ref 5–15)
BUN: 8 mg/dL (ref 6–23)
CHLORIDE: 95 meq/L — AB (ref 96–112)
CO2: 28 mEq/L (ref 19–32)
Calcium: 9.2 mg/dL (ref 8.4–10.5)
Creatinine, Ser: 0.56 mg/dL (ref 0.50–1.10)
GFR calc Af Amer: >90 mL/min (ref >90)
GFR calc non Af Amer: >90 mL/min (ref >90)
GLUCOSE: 300 mg/dL — AB (ref 70–99)
POTASSIUM: 3.8 meq/L (ref 3.7–5.3)
SODIUM: 134 meq/L — AB (ref 137–147)

## 2013-08-02 LAB — CBG MONITORING, ED
GLUCOSE-CAPILLARY: 361 mg/dL — AB (ref 70–99)
GLUCOSE-CAPILLARY: 247 mg/dL — AB (ref 70–99)

## 2013-08-02 MED ORDER — SODIUM CHLORIDE 0.9 % IV BOLUS (SEPSIS)
1000.0000 mL | Freq: Once | INTRAVENOUS | Status: AC
  Administered 2013-08-02: 1000 mL via INTRAVENOUS

## 2013-08-02 NOTE — Discharge Instructions (Signed)
Increase your evening dose of insulin to 33 units.  Follow up with your md this week.

## 2013-08-02 NOTE — ED Notes (Signed)
Pt states she became dizzy at home so she checked for blood sugar. Pt reports BG of "over 500" at home. CBG was 361 in triage. Pt also reports pain in both feet.

## 2013-08-02 NOTE — ED Notes (Signed)
CBG 361 

## 2013-08-02 NOTE — ED Provider Notes (Signed)
CSN: [id number]     Arrival date & time 08/02/13  1359 History   First MD Initiated Contact with Patient 08/02/13 1534     Chief Complaint  Patient presents with  . Hyperglycemia     (Consider location/radiation/quality/duration/timing/severity/associated sxs/prior Treatment) Patient is a 63 y.o. female presenting with hyperglycemia. The history is provided by the patient (pt states her sugar has been high and she is weak).  Hyperglycemia Severity:  Moderate Onset quality:  Gradual Timing:  Constant Progression:  Unchanged Chronicity:  New Diabetes status:  Controlled with insulin Associated symptoms: fatigue   Associated symptoms: no abdominal pain and no chest pain     Past Medical History  Diagnosis Date  . Diabetes mellitus without complication   . Hypertension   . Asthma   . Neuropathic pain   . Hepatitis C   . Renal carcinoma    Past Surgical History  Procedure Laterality Date  . Abdominal hysterectomy    . Wart removal    . Colonoscopy N/A 07/08/2013    Procedure: COLONOSCOPY;  Surgeon: [name], MD;  [facility]: [facility];  Service: Endoscopy;  Laterality: N/A;  7:30  . Esophagogastroduodenoscopy N/A 07/08/2013    Procedure: ESOPHAGOGASTRODUODENOSCOPY (EGD);  Surgeon: [name], MD;  Location: [facility];  Service: Endoscopy;  Laterality: N/A;  . Savory dilation N/A 07/08/2013    Procedure: SAVORY DILATION;  Surgeon: [name], MD;  [facility]: [facility];  Service: Endoscopy;  Laterality: N/A;  [name] dilation N/A 07/08/2013    Procedure: [name] DILATION;  Surgeon: [name], MD;  Location: [facility];  Service: Endoscopy;  Laterality: N/A;   Family History  Problem Relation Age of Onset  . Colon cancer Sister 46   History  Substance Use Topics  . Smoking status: Current Every Day Smoker  . Smokeless tobacco: Not on file  . Alcohol Use: Yes     Comment: twice a week sometimes, drinks 2-3 40 oz.    OB History   Grav Para  Term Preterm Abortions TAB SAB Ect Mult Living                 Review of Systems  Constitutional: Positive for fatigue. Negative for appetite change.  HENT: Negative for congestion, ear discharge and sinus pressure.   Eyes: Negative for discharge.  Respiratory: Negative for cough.   Cardiovascular: Negative for chest pain.  Gastrointestinal: Negative for abdominal pain and diarrhea.  Genitourinary: Negative for frequency and hematuria.  Musculoskeletal: Negative for back pain.  Skin: Negative for rash.  Neurological: Negative for seizures and headaches.  Psychiatric/Behavioral: Negative for hallucinations.      Allergies  Sulfa antibiotics  Home Medications   Prior to Admission medications   Medication Sig Start Date End Date Taking? Authorizing Provider  aspirin EC 81 MG tablet Take 81 mg by mouth daily.   Yes Historical Provider, MD  Fluticasone-Salmeterol (ADVAIR) 250-50 MCG/DOSE AEPB Inhale 1 puff into the lungs every 12 (twelve) hours.   Yes Historical Provider, MD  gabapentin (NEURONTIN) 600 MG tablet Take 600 mg by mouth 3 (three) times daily.   Yes Historical Provider, MD  insulin detemir (LEVEMIR) 100 UNIT/ML injection Inject 30 Units into the skin 2 (two) times daily.   Yes Historical Provider, MD  insulin NPH Human (HUMULIN N,NOVOLIN N) 100 UNIT/ML injection Inject 6 Units into the skin 3 (three) times daily with meals.   Yes Historical Provider, MD  Linaclotide Rolan Lipa) 145 MCG  CAPS capsule Take 1 capsule (145 mcg total) by mouth daily. 30 minutes before breakfast to avoid diarrhea. 06/30/13  Yes Orvil Feil, NP  lisinopril-hydrochlorothiazide (PRINZIDE,ZESTORETIC) 10-12.5 MG per tablet Take 1 tablet by mouth daily.   Yes Historical Provider, MD  metoprolol tartrate (LOPRESSOR) 25 MG tablet Take 25 mg by mouth daily.   Yes Historical Provider, MD  omeprazole (PRILOSEC) 20 MG capsule Take 1 capsule (20 mg total) by mouth daily. Take 30 minutes prior to breakfast 06/30/13   Yes Orvil Feil, NP  hydrocortisone (ANUSOL-HC) 2.5 % rectal cream Place 1 application rectally 2 (two) times daily. 07/29/13   Orvil Feil, NP   BP 160/83  Pulse 103  Temp(Src) 98.3 F (36.8 C) (Oral)  Resp 16  Ht 5' (1.524 m)  Wt 162 lb (73.483 kg)  BMI 31.64 kg/m2  SpO2 99% Physical Exam  Constitutional: She is oriented to person, place, and time. She appears well-developed.  HENT:  Head: Normocephalic.  Eyes: Conjunctivae and EOM are normal. No scleral icterus.  Neck: Neck supple. No thyromegaly present.  Cardiovascular: Normal rate and regular rhythm.  Exam reveals no gallop and no friction rub.   No murmur heard. Pulmonary/Chest: No stridor. She has no wheezes. She has no rales. She exhibits no tenderness.  Abdominal: She exhibits no distension. There is no tenderness. There is no rebound.  Musculoskeletal: Normal range of motion. She exhibits no edema.  Lymphadenopathy:    She has no cervical adenopathy.  Neurological: She is oriented to person, place, and time. She exhibits normal muscle tone. Coordination normal.  Skin: No rash noted. No erythema.  Psychiatric: She has a normal mood and affect. Her behavior is normal.    ED Course  Procedures (including critical care time) Labs Review Labs Reviewed  CBC WITH DIFFERENTIAL - Abnormal; Notable for the following:    Neutrophils Relative % 32 (*)    Lymphocytes Relative 57 (*)    Lymphs Abs 4.7 (*)    All other components within normal limits  BASIC METABOLIC PANEL - Abnormal; Notable for the following:    Sodium 134 (*)    Chloride 95 (*)    Glucose, Bld 300 (*)    All other components within normal limits  CBG MONITORING, ED - Abnormal; Notable for the following:    Glucose-Capillary 361 (*)    All other components within normal limits  CBG MONITORING, ED - Abnormal; Notable for the following:    Glucose-Capillary 247 (*)    All other components within normal limits    Imaging Review No results found.   EKG  Interpretation None      MDM   Final diagnoses:  Hyperglycemia    Mild hyperglycemia,  Will increase evening insulin 10%      Maudry Diego, MD 08/02/13 1750

## 2013-08-02 NOTE — ED Notes (Signed)
Blood glucose was over 400 this morning at 1000. Took 30 units of Levemir.  2 hours later BG was over 500.   Has been treated for infection recently, but doesn't know what kind.

## 2013-08-03 NOTE — Telephone Encounter (Signed)
Pt is aware and feels better at this time. Will call back if she needs an ov.

## 2013-08-07 ENCOUNTER — Other Ambulatory Visit: Payer: Self-pay | Admitting: Urology

## 2013-08-07 DIAGNOSIS — N2889 Other specified disorders of kidney and ureter: Secondary | ICD-10-CM

## 2013-08-07 DIAGNOSIS — N838 Other noninflammatory disorders of ovary, fallopian tube and broad ligament: Secondary | ICD-10-CM

## 2013-08-09 ENCOUNTER — Emergency Department (HOSPITAL_COMMUNITY)
Admission: EM | Admit: 2013-08-09 | Discharge: 2013-08-09 | Disposition: A | Discharge status: Home / Self Care | Payer: BC Managed Care – PPO | Attending: Emergency Medicine | Admitting: Emergency Medicine

## 2013-08-09 ENCOUNTER — Encounter (HOSPITAL_COMMUNITY): Payer: Self-pay | Admitting: Emergency Medicine

## 2013-08-09 DIAGNOSIS — F17200 Nicotine dependence, unspecified, uncomplicated: Secondary | ICD-10-CM | POA: Insufficient documentation

## 2013-08-09 DIAGNOSIS — E1169 Type 2 diabetes mellitus with other specified complication: Secondary | ICD-10-CM | POA: Insufficient documentation

## 2013-08-09 DIAGNOSIS — Z7982 Long term (current) use of aspirin: Secondary | ICD-10-CM | POA: Insufficient documentation

## 2013-08-09 DIAGNOSIS — Z79899 Other long term (current) drug therapy: Secondary | ICD-10-CM | POA: Insufficient documentation

## 2013-08-09 DIAGNOSIS — I10 Essential (primary) hypertension: Secondary | ICD-10-CM | POA: Insufficient documentation

## 2013-08-09 DIAGNOSIS — J45909 Unspecified asthma, uncomplicated: Secondary | ICD-10-CM | POA: Insufficient documentation

## 2013-08-09 DIAGNOSIS — Z85528 Personal history of other malignant neoplasm of kidney: Secondary | ICD-10-CM | POA: Insufficient documentation

## 2013-08-09 DIAGNOSIS — Z8619 Personal history of other infectious and parasitic diseases: Secondary | ICD-10-CM | POA: Insufficient documentation

## 2013-08-09 DIAGNOSIS — E162 Hypoglycemia, unspecified: Secondary | ICD-10-CM

## 2013-08-09 DIAGNOSIS — Z794 Long term (current) use of insulin: Secondary | ICD-10-CM | POA: Insufficient documentation

## 2013-08-09 DIAGNOSIS — R609 Edema, unspecified: Secondary | ICD-10-CM | POA: Insufficient documentation

## 2013-08-09 DIAGNOSIS — IMO0002 Reserved for concepts with insufficient information to code with codable children: Secondary | ICD-10-CM | POA: Insufficient documentation

## 2013-08-09 LAB — CBG MONITORING, ED
GLUCOSE-CAPILLARY: 62 mg/dL — AB (ref 70–99)
GLUCOSE-CAPILLARY: 127 mg/dL — AB (ref 70–99)
GLUCOSE-CAPILLARY: 173 mg/dL — AB (ref 70–99)
Glucose-Capillary: 67 mg/dL — ABNORMAL LOW (ref 70–99)
Glucose-Capillary: 86 mg/dL (ref 70–99)

## 2013-08-09 LAB — URINALYSIS, ROUTINE W REFLEX MICROSCOPIC
Bilirubin Urine: NEGATIVE
GLUCOSE, UA: NEGATIVE mg/dL
Hgb urine dipstick: NEGATIVE
KETONES UR: NEGATIVE mg/dL
LEUKOCYTES UA: NEGATIVE
NITRITE: NEGATIVE
Protein, ur: NEGATIVE mg/dL
Specific Gravity, Urine: 1.01 (ref 1.005–1.030)
UROBILINOGEN UA: 0.2 mg/dL (ref 0.0–1.0)
pH: 7.5 (ref 5.0–8.0)

## 2013-08-09 LAB — BASIC METABOLIC PANEL
ANION GAP: 9 (ref 5–15)
BUN: 10 mg/dL (ref 6–23)
CHLORIDE: 98 meq/L (ref 96–112)
CO2: 30 mEq/L (ref 19–32)
Calcium: 8.9 mg/dL (ref 8.4–10.5)
Creatinine, Ser: 0.65 mg/dL (ref 0.50–1.10)
GFR calc Af Amer: >90 mL/min (ref >90)
GFR calc non Af Amer: >90 mL/min (ref >90)
Glucose, Bld: 84 mg/dL (ref 70–99)
Potassium: 3.5 mEq/L — ABNORMAL LOW (ref 3.7–5.3)
SODIUM: 137 meq/L (ref 137–147)

## 2013-08-09 LAB — CBC
HEMATOCRIT: 38.2 % (ref 36.0–46.0)
HEMOGLOBIN: 12.7 g/dL (ref 12.0–15.0)
MCH: 29.3 pg (ref 26.0–34.0)
MCHC: 33.2 g/dL (ref 30.0–36.0)
MCV: 88.2 fL (ref 78.0–100.0)
Platelets: 181 10*3/uL (ref 150–400)
RBC: 4.33 MIL/uL (ref 3.87–5.11)
RDW: 13.6 % (ref 11.5–15.5)
WBC: 8.9 10*3/uL (ref 4.0–10.5)

## 2013-08-09 MED ORDER — IBUPROFEN 400 MG PO TABS
400.0000 mg | ORAL_TABLET | Freq: Once | ORAL | Status: AC
  Administered 2013-08-09: 400 mg via ORAL
  Filled 2013-08-09: qty 1

## 2013-08-09 NOTE — ED Notes (Signed)
Complain of nausea and blood sugar elevated

## 2013-08-09 NOTE — ED Notes (Signed)
Pt CGB 67. RN aware.

## 2013-08-09 NOTE — Discharge Instructions (Signed)
Do not take your insulin, tonight. Decrease your insulin dosing, by one half, until you see your doctor in the next one or 2 days. That means take 15 units twice a day, instead of 30 units twice a day.   Hypoglycemia Hypoglycemia occurs when the glucose in your blood is too low. Glucose is a type of sugar that is your body's main energy source. Hormones, such as insulin and glucagon, control the level of glucose in the blood. Insulin lowers blood glucose and glucagon increases blood glucose. Having too much insulin in your blood stream, or not eating enough food containing sugar, can result in hypoglycemia. Hypoglycemia can happen to people with or without diabetes. It can develop quickly and can be a medical emergency.  CAUSES   Missing or delaying meals.  Not eating enough carbohydrates at meals.  Taking too much diabetes medicine.  Not timing your oral diabetes medicine or insulin doses with meals, snacks, and exercise.  Nausea and vomiting.  Certain medicines.  Severe illnesses, such as hepatitis, kidney disorders, and certain eating disorders.  Increased activity or exercise without eating something extra or adjusting medicines.  Drinking too much alcohol.  A nerve disorder that affects body functions like your heart rate, blood pressure, and digestion (autonomic neuropathy).  A condition where the stomach muscles do not function properly (gastroparesis). Therefore, medicines and food may not absorb properly.  Rarely, a tumor of the pancreas can produce too much insulin. SYMPTOMS   Hunger.  Sweating (diaphoresis).  Change in body temperature.  Shakiness.  Headache.  Anxiety.  Lightheadedness.  Irritability.  Difficulty concentrating.  Dry mouth.  Tingling or numbness in the hands or feet.  Restless sleep or sleep disturbances.  Altered speech and coordination.  Change in mental status.  Seizures or prolonged  convulsions.  Combativeness.  Drowsiness (lethargic).  Weakness.  Increased heart rate or palpitations.  Confusion.  Pale, gray skin color.  Blurred or double vision.  Fainting. DIAGNOSIS  A physical exam and medical history will be performed. Your caregiver may make a diagnosis based on your symptoms. Blood tests and other lab tests may be performed to confirm a diagnosis. Once the diagnosis is made, your caregiver will see if your signs and symptoms go away once your blood glucose is raised.  TREATMENT  Usually, you can easily treat your hypoglycemia when you notice symptoms.  Check your blood glucose. If it is less than 70 mg/dl, take one of the following:   3-4 glucose tablets.    cup juice.    cup regular soda.   1 cup skim milk.   -1 tube of glucose gel.   5-6 hard candies.   Avoid high-fat drinks or food that may delay a rise in blood glucose levels.  Do not take more than the recommended amount of sugary foods, drinks, gel, or tablets. Doing so will cause your blood glucose to go too high.   Wait 10-15 minutes and recheck your blood glucose. If it is still less than 70 mg/dl or below your target range, repeat treatment.   Eat a snack if it is more than 1 hour until your next meal.  There may be a time when your blood glucose may go so low that you are unable to treat yourself at home when you start to notice symptoms. You may need someone to help you. You may even faint or be unable to swallow. If you cannot treat yourself, someone will need to bring you to the hospital.  HOME CARE INSTRUCTIONS  If you have diabetes, follow your diabetes management plan by:  Taking your medicines as directed.  Following your exercise plan.  Following your meal plan. Do not skip meals. Eat on time.  Testing your blood glucose regularly. Check your blood glucose before and after exercise. If you exercise longer or different than usual, be sure to check blood  glucose more frequently.  Wearing your medical alert jewelry that says you have diabetes.  Identify the cause of your hypoglycemia. Then, develop ways to prevent the recurrence of hypoglycemia.  Do not take a hot bath or shower right after an insulin shot.  Always carry treatment with you. Glucose tablets are the easiest to carry.  If you are going to drink alcohol, drink it only with meals.  Tell friends or family members ways to keep you safe during a seizure. This may include removing hard or sharp objects from the area or turning you on your side.  Maintain a healthy weight. SEEK MEDICAL CARE IF:   You are having problems keeping your blood glucose in your target range.  You are having frequent episodes of hypoglycemia.  You feel you might be having side effects from your medicines.  You are not sure why your blood glucose is dropping so low.  You notice a change in vision or a new problem with your vision. SEEK IMMEDIATE MEDICAL CARE IF:   Confusion develops.  A change in mental status occurs.  The inability to swallow develops.  Fainting occurs. Document Released: 01/08/2005 Document Revised: 01/13/2013 Document Reviewed: 05/07/2011 [facility] Patient Information 2015 [place], [place]. This information is not intended to replace advice given to you by your health care provider. Make sure you discuss any questions you have with your health care provider.

## 2013-08-09 NOTE — ED Notes (Signed)
CBG post meal - 127

## 2013-08-09 NOTE — ED Notes (Addendum)
Pt states CBG taken at 10am was 194.  Pt complains of generalized weakness x3 days.  Orange Juice given due to CBG 67 at triage.    No generalized weakness noted.

## 2013-08-09 NOTE — ED Notes (Signed)
CBG 86. 

## 2013-08-09 NOTE — ED Provider Notes (Signed)
CSN: [id number]     Arrival date & time 08/09/13  1506 History   First MD Initiated Contact with Patient 08/09/13 1535     Chief Complaint  Patient presents with  . Abdominal Pain     (Consider location/radiation/quality/duration/timing/severity/associated sxs/prior Treatment) Patient is a 63 y.o. female presenting with abdominal pain. The history is provided by the patient.  Abdominal Pain  She complains of leg weakness for several days with decreased appetite, and periods of low blood sugar. She has not modified her insulin intake or decreased appetite. She denies fever, chills, nausea, cough, shortness of breath, chest pain, or back pain. There has been no dysuria, urinary frequency, diarrhea, or constipation. There are no other known modifying factors.   Past Medical History  Diagnosis Date  . Diabetes mellitus without complication   . Hypertension   . Asthma   . Neuropathic pain   . Hepatitis C   . Renal carcinoma    Past Surgical History  Procedure Laterality Date  . Abdominal hysterectomy    . Wart removal    . Colonoscopy N/A 07/08/2013    Procedure: COLONOSCOPY;  Surgeon: [name], MD;  [facility];  Service: Endoscopy;  Laterality: N/A;  7:30  . Esophagogastroduodenoscopy N/A 07/08/2013    Procedure: ESOPHAGOGASTRODUODENOSCOPY (EGD);  Surgeon: [name], MD;  [facility];  Service: Endoscopy;  Laterality: N/A;  . Savory dilation N/A 07/08/2013    Procedure: SAVORY DILATION;  Surgeon: [name], MD;  [facility];  Service: Endoscopy;  Laterality: N/A;  [name] dilation N/A 07/08/2013    Procedure: [name] DILATION;  Surgeon: [name], MD;  [facility];  Service: Endoscopy;  Laterality: N/A;   Family History  Problem Relation Age of Onset  . Colon cancer Sister 5   History  Substance Use Topics  . Smoking status: Current Every Day Smoker  . Smokeless tobacco: Not on file  . Alcohol Use: Yes      Comment: twice a week sometimes, drinks 2-3 40 oz.    OB History   Grav Para Term Preterm Abortions TAB SAB Ect Mult Living                 Review of Systems  Gastrointestinal: Positive for abdominal pain.  All other systems reviewed and are negative.     Allergies  Sulfa antibiotics  Home Medications   Prior to Admission medications   Medication Sig Start Date End Date Taking? Authorizing Provider  aspirin EC 81 MG tablet Take 81 mg by mouth daily.   Yes Historical Provider, MD  Fluticasone-Salmeterol (ADVAIR) 250-50 MCG/DOSE AEPB Inhale 1 puff into the lungs every 12 (twelve) hours.   Yes Historical Provider, MD  gabapentin (NEURONTIN) 600 MG tablet Take 600 mg by mouth 3 (three) times daily.   Yes Historical Provider, MD  insulin detemir (LEVEMIR) 100 UNIT/ML injection Inject 30 Units into the skin 2 (two) times daily.   Yes Historical Provider, MD  insulin NPH Human (HUMULIN N,NOVOLIN N) 100 UNIT/ML injection Inject 6 Units into the skin 3 (three) times daily with meals.   Yes Historical Provider, MD  Linaclotide Rolan Lipa) 145 MCG CAPS capsule Take 1 capsule (145 mcg total) by mouth daily. 30 minutes before breakfast to avoid diarrhea. 06/30/13  Yes Orvil Feil, NP  lisinopril-hydrochlorothiazide (PRINZIDE,ZESTORETIC) 10-12.5 MG per tablet Take 1 tablet by mouth daily.   Yes Historical Provider, MD  metoprolol tartrate (LOPRESSOR) 25  MG tablet Take 25 mg by mouth daily.   Yes Historical Provider, MD  omeprazole (PRILOSEC) 20 MG capsule Take 1 capsule (20 mg total) by mouth daily. Take 30 minutes prior to breakfast 06/30/13  Yes Orvil Feil, NP  hydrocortisone (ANUSOL-HC) 2.5 % rectal cream Place 1 application rectally 2 (two) times daily. 07/29/13   Orvil Feil, NP   BP 157/82  Pulse 73  Temp(Src) 99.1 F (37.3 C) (Oral)  Resp 21  Ht 5' 3"  (1.6 m)  Wt 162 lb (73.483 kg)  BMI 28.70 kg/m2  SpO2 99% Physical Exam  Nursing note and vitals reviewed. Constitutional: She is oriented  to person, place, and time. She appears well-developed and well-nourished.  HENT:  Head: Normocephalic and atraumatic.  Eyes: Conjunctivae and EOM are normal. Pupils are equal, round, and reactive to light.  Neck: Normal range of motion and phonation normal. Neck supple.  Cardiovascular: Normal rate, regular rhythm and intact distal pulses.   Pulmonary/Chest: Effort normal and breath sounds normal. No respiratory distress. She exhibits no tenderness.  Abdominal: Soft. She exhibits no distension. There is no tenderness. There is no guarding.  Musculoskeletal: Normal range of motion. She exhibits edema (trace bilateral lower extremities edema).  Neurological: She is alert and oriented to person, place, and time. She exhibits normal muscle tone.  No dysarthria, ataxia or nystagmus  Skin: Skin is warm and dry.  Bruise left volar, forearm, secondary to recent IV catheterization  Psychiatric: She has a normal mood and affect. Her behavior is normal. Judgment and thought content normal.    ED Course  Procedures (including critical care time) Labs Review Labs Reviewed  BASIC METABOLIC PANEL - Abnormal; Notable for the following:    Potassium 3.5 (*)    All other components within normal limits  CBG MONITORING, ED - Abnormal; Notable for the following:    Glucose-Capillary 67 (*)    All other components within normal limits  CBG MONITORING, ED - Abnormal; Notable for the following:    Glucose-Capillary 62 (*)    All other components within normal limits  CBG MONITORING, ED - Abnormal; Notable for the following:    Glucose-Capillary 127 (*)    All other components within normal limits  URINE CULTURE  CBC  URINALYSIS, ROUTINE W REFLEX MICROSCOPIC  CBG MONITORING, ED  CBG MONITORING, ED    8:00 PM Reevaluation with update and discussion. After initial assessment and treatment, an updated evaluation reveals CBG is normalized now, after oral nutrition and hydration. Patient requested pain  medicine for neuropathy. I advised her to followup with her PCP to discuss treatments for that. She understands that she will need to alter her insulin dosing, until she sees her doctor and/or has improved appetite. She states that she has a followup appointment, tomorrow for an MRI to evaluate for renal carcinoma. All findings discussed with patient and  questions answered.Daleen Bo L   Imaging Review No results found.   EKG Interpretation   Date/Time:  Sunday August 09 2013 15:49:13 EDT Ventricular Rate:  77 PR Interval:  191 QRS Duration: 70 QT Interval:  419 QTC Calculation: 474 R Axis:   34 Text Interpretation:  Sinus rhythm No old tracing to compare Confirmed by  [facility]  MD, Dalyn Becker [phone]) on 08/09/2013 7:53:23 PM      MDM   Final diagnoses:  Hypoglycemia    Nonspecific hypoglycemia, likely related to poor nutritional intake. I think this is secondary to her concern and worry over her  recent diagnosis of possible renal carcinoma. She is nontoxic, without evidence for systemic infection or serous metabolic instability. She's improving when encouraged to eat and drink, and is stable for discharge.  Nursing Notes Reviewed/ Care Coordinated Applicable Imaging Reviewed Interpretation of Laboratory Data incorporated into ED treatment  The patient appears reasonably screened and/or stabilized for discharge and I doubt any other medical condition or other [facility] requiring further screening, evaluation, or treatment in the ED at this time prior to discharge.  Plan: Home Medications- 1/2 insulin until f/u with PCP in 1-2 days; Home Treatments- rest, eat and drink regularly; return here if the recommended treatment, does not improve the symptoms; Recommended follow up- PCP f/u in 1-2 days    Richarda Blade, MD 08/09/13 2006

## 2013-08-10 ENCOUNTER — Telehealth (HOSPITAL_COMMUNITY): Payer: Self-pay

## 2013-08-11 LAB — URINE CULTURE: Colony Count: 40000

## 2013-08-17 ENCOUNTER — Ambulatory Visit: Payer: BC Managed Care – PPO

## 2013-08-17 ENCOUNTER — Ambulatory Visit: Payer: BC Managed Care – PPO | Attending: Gynecologic Oncology | Admitting: Gynecologic Oncology

## 2013-08-17 ENCOUNTER — Encounter: Payer: Self-pay | Admitting: Gynecologic Oncology

## 2013-08-17 VITALS — BP 161/98 | HR 109 | Temp 99.0°F | Resp 18 | Ht 63.0 in | Wt 156.1 lb

## 2013-08-17 DIAGNOSIS — Z7982 Long term (current) use of aspirin: Secondary | ICD-10-CM | POA: Insufficient documentation

## 2013-08-17 DIAGNOSIS — E119 Type 2 diabetes mellitus without complications: Secondary | ICD-10-CM | POA: Insufficient documentation

## 2013-08-17 DIAGNOSIS — Z794 Long term (current) use of insulin: Secondary | ICD-10-CM | POA: Insufficient documentation

## 2013-08-17 DIAGNOSIS — I10 Essential (primary) hypertension: Secondary | ICD-10-CM | POA: Insufficient documentation

## 2013-08-17 DIAGNOSIS — Z79899 Other long term (current) drug therapy: Secondary | ICD-10-CM | POA: Insufficient documentation

## 2013-08-17 DIAGNOSIS — B1920 Unspecified viral hepatitis C without hepatic coma: Secondary | ICD-10-CM | POA: Insufficient documentation

## 2013-08-17 DIAGNOSIS — J45909 Unspecified asthma, uncomplicated: Secondary | ICD-10-CM | POA: Insufficient documentation

## 2013-08-17 DIAGNOSIS — N83209 Unspecified ovarian cyst, unspecified side: Secondary | ICD-10-CM | POA: Insufficient documentation

## 2013-08-17 DIAGNOSIS — N838 Other noninflammatory disorders of ovary, fallopian tube and broad ligament: Secondary | ICD-10-CM

## 2013-08-17 DIAGNOSIS — Z90710 Acquired absence of both cervix and uterus: Secondary | ICD-10-CM | POA: Insufficient documentation

## 2013-08-17 DIAGNOSIS — Z85528 Personal history of other malignant neoplasm of kidney: Secondary | ICD-10-CM | POA: Insufficient documentation

## 2013-08-17 DIAGNOSIS — C679 Malignant neoplasm of bladder, unspecified: Secondary | ICD-10-CM

## 2013-08-17 NOTE — Progress Notes (Signed)
Consult Note: Gyn-Onc  Consult was requested by Dr. Tresa Moore for the evaluation of Cassandra Jordan 63 y.o. female with the left ovarian 4 cm cystic mass.  CC:  Chief Complaint  Patient presents with  . kidney cancer    Right Kidney to be removed, possibley right ovary    Assessment/Plan:  Cassandra Jordan  is a 63 y.o.  year old woman who is seen in consultation at the request of Dr. Tresa Moore for a 4 cm cystic mass on the left ovary in the setting of right multifocal renal mass is suspicious for renal cell carcinoma. The patient has a planned nephrectomy with Dr. Tresa Moore who would like input as to whether it BSO should be performed at the time of the surgery.  I have a low suspicion that this mass is an ovarian malignancy. I personally reviewed the images on pacs and identified the cysts but in no associated signs of metastatic adenocarcinoma (including ascites omental nodularity carcinomatosis). On today's pelvic examination there were no concerning signs for a malignant mass, with no rectovaginal septal nodularity or palpable masses in the pelvis. I am recommending the CA 125 ordered this for today. If it is elevated that I do recommend laparoscopic BSO the time of surgery. If it is within normal range a reasonable option would be to either combine the planned procedure to include a laparoscopy for ovarian removal and frozen section during the same procedure as the nephrectomy. Alternatively, if we have a low suspicion for ovarian cancer due to a normal CA 125, and if a combined surgical approach adds inappropriate risk and time to the planned nephrectomy we could follow the ovarian mass with serial ultrasounds.    HPI:  Cassandra Jordan is a 63 year old woman who is seen in a local ER for abdominal pain a CT scan of the abdomen and pelvis was performed which showed multifocal rate solid enhancing renal masses, bilateral renal cysts, and a left ovarian mass measuring 4 cm. It was cystic in nature.  There were no associated signs of metastatic ovarian cancer such as ascites, omental nodularity, or carcinomatosis. She's had a prior hysterectomy for benign reasons remotely. Due to the concerning nature of her right renal masses she was seen by Dr. Tresa Moore of [facility] and a decision was made to proceed with a right nephrectomy. In following up with her left ovarian mass Dr. Tresa Moore has asked me to weight in as to whether or not a combined procedure with left salpingo-oophorectomy should be performed at the same time as his surgical intervention.   The patient reports that her pain is improved since that initial visit. She reports that the time she only had pain when she mashed on her abdomen. She denies a change in bladder or bowel habits. She denies postmenopausal bleeding. She denies abdominal bloating early satiety or weight gain or weight loss.   Interval History: Her pain is improved and she was evaluated for abdominal pain with imaging.  Current Meds:  Outpatient Encounter Prescriptions as of 08/17/2013  Medication Sig  . aspirin EC 81 MG tablet Take 81 mg by mouth daily.  Marland Kitchen gabapentin (NEURONTIN) 600 MG tablet Take 600 mg by mouth 3 (three) times daily.  . hydrocortisone (ANUSOL-HC) 2.5 % rectal cream Place 1 application rectally 2 (two) times daily.  . insulin detemir (LEVEMIR) 100 UNIT/ML injection Inject 30 Units into the skin 2 (two) times daily.  . insulin NPH Human (HUMULIN N,NOVOLIN N) 100 UNIT/ML injection Inject 6  Units into the skin 3 (three) times daily with meals.  . metoprolol tartrate (LOPRESSOR) 25 MG tablet Take 25 mg by mouth daily.  Marland Kitchen omeprazole (PRILOSEC) 20 MG capsule Take 1 capsule (20 mg total) by mouth daily. Take 30 minutes prior to breakfast  . Fluticasone-Salmeterol (ADVAIR) 250-50 MCG/DOSE AEPB Inhale 1 puff into the lungs every 12 (twelve) hours.  . Linaclotide (LINZESS) 145 MCG CAPS capsule Take 1 capsule (145 mcg total) by mouth daily. 30 minutes before  breakfast to avoid diarrhea.  . [DISCONTINUED] lisinopril-hydrochlorothiazide (PRINZIDE,ZESTORETIC) 10-12.5 MG per tablet Take 1 tablet by mouth daily.    Allergy:  Allergies  Allergen Reactions  . Sulfa Antibiotics Itching and Other (See Comments)    burning    Social Hx:   History   Social History  . Marital Status: Single    Spouse Name: N/A    Number of Children: N/A  . Years of Education: N/A   Occupational History  . Not on file.   Social History Main Topics  . Smoking status: Current Every Day Smoker  . Smokeless tobacco: Not on file  . Alcohol Use: Yes     Comment: twice a week sometimes, drinks 2-3 40 oz.   . Drug Use: Yes    Special: Marijuana  . Sexual Activity: Yes    Birth Control/ Protection: Post-menopausal, Surgical   Other Topics Concern  . Not on file   Social History Narrative  . No narrative on file    Past Surgical Hx:  Past Surgical History  Procedure Laterality Date  . Abdominal hysterectomy    . Wart removal    . Colonoscopy N/A 07/08/2013    Procedure: COLONOSCOPY;  Surgeon: [name], MD;  [facility];  Service: Endoscopy;  Laterality: N/A;  7:30  . Esophagogastroduodenoscopy N/A 07/08/2013    Procedure: ESOPHAGOGASTRODUODENOSCOPY (EGD);  Surgeon: [name], MD;  [facility];  Service: Endoscopy;  Laterality: N/A;  . Savory dilation N/A 07/08/2013    Procedure: SAVORY DILATION;  Surgeon: [name], MD;  [facility];  Service: Endoscopy;  Laterality: N/A;  [name] dilation N/A 07/08/2013    Procedure: [name] DILATION;  Surgeon: [name], MD;  [facility];  Service: Endoscopy;  Laterality: N/A;    Past Medical Hx:  Past Medical History  Diagnosis Date  . Diabetes mellitus without complication   . Hypertension   . Asthma   . Neuropathic pain   . Hepatitis C   . Renal carcinoma     Past Gynecological History:  G1 P1, no prior abnormal Pap smears, hysterectomy  several years ago by her laparotomy for her abnormal bleeding. No LMP recorded. Patient has had a hysterectomy.  Family Hx:  Family History  Problem Relation Age of Onset  . Colon cancer Sister 54    Review of Systems:  Constitutional  Feels well,    ENT Normal appearing ears and nares bilaterally Skin/Breast  No rash, sores, jaundice, itching, dryness Cardiovascular  No chest pain, shortness of breath, or edema  Pulmonary  No cough or wheeze.  Gastro Intestinal  No nausea, vomitting, or diarrhoea. No bright red blood per rectum, no abdominal pain, change in bowel movement, or constipation.  Genito Urinary  No frequency, urgency, dysuria, see hpi Musculo Skeletal  No myalgia, arthralgia, joint swelling or pain  Neurologic  No weakness, numbness, change in gait,  Psychology  No depression, anxiety, insomnia.   Vitals:  Blood pressure 161/98, pulse 109, temperature 99 F (37.2 C), temperature source Oral, resp. rate 18, height 5' 3"  (1.6 m), weight 156 lb 1.6 oz (70.806 kg).  Physical Exam: WD in NAD Neck  Supple NROM, without any enlargements.  Lymph Node Survey No cervical supraclavicular or inguinal adenopathy Cardiovascular  Pulse normal rate, regularity and rhythm. S1 and S2 normal.  Lungs  Clear to auscultation bilateraly, without wheezes/crackles/rhonchi. Good air movement.  Skin  No rash/lesions/breakdown  Psychiatry  Alert and oriented to person, place, and time  Abdomen  Normoactive bowel sounds, abdomen soft, non-tender and obese without evidence of hernia.  Back No CVA tenderness Genito Urinary  Vulva/vagina: Normal external female genitalia.  No lesions. No discharge or bleeding.  Bladder/urethra:  No lesions or masses, well supported bladder  Vagina: normal appearing  Cervix: surgically absent  Uterus: surgically absent  Adnexa: no discretely palpable masses though exam is limited by body habitus. Rectal  Good tone, no masses no cul de sac  nodularity.  Extremities  No bilateral cyanosis, clubbing or edema.   Donaciano Eva, MD   08/17/2013, 3:18 PM

## 2013-08-17 NOTE — Patient Instructions (Signed)
We will call you with your lab results 

## 2013-08-18 LAB — CA 125: CA 125: 3 U/mL (ref 0.0–30.2)

## 2013-08-21 ENCOUNTER — Ambulatory Visit (HOSPITAL_COMMUNITY)
Admission: RE | Admit: 2013-08-21 | Discharge: 2013-08-21 | Disposition: A | Discharge status: Home / Self Care | Payer: BC Managed Care – PPO | Source: Ambulatory Visit | Attending: Urology | Admitting: Urology

## 2013-08-21 DIAGNOSIS — N2889 Other specified disorders of kidney and ureter: Secondary | ICD-10-CM

## 2013-08-21 DIAGNOSIS — N289 Disorder of kidney and ureter, unspecified: Secondary | ICD-10-CM | POA: Insufficient documentation

## 2013-08-21 DIAGNOSIS — N838 Other noninflammatory disorders of ovary, fallopian tube and broad ligament: Secondary | ICD-10-CM

## 2013-08-21 DIAGNOSIS — Q619 Cystic kidney disease, unspecified: Secondary | ICD-10-CM | POA: Insufficient documentation

## 2013-08-21 DIAGNOSIS — N839 Noninflammatory disorder of ovary, fallopian tube and broad ligament, unspecified: Secondary | ICD-10-CM | POA: Insufficient documentation

## 2013-08-21 MED ORDER — GADOBENATE DIMEGLUMINE 529 MG/ML IV SOLN
15.0000 mL | Freq: Once | INTRAVENOUS | Status: AC | PRN
  Administered 2013-08-21: 15 mL via INTRAVENOUS

## 2013-08-26 NOTE — Progress Notes (Signed)
Looks like CA 125 completely normal. As this is on the contralateral side of kidney to be removed, maybe best just observation of ovary unless you feel otherwise.  Phebe Colla

## 2013-08-29 ENCOUNTER — Emergency Department (HOSPITAL_COMMUNITY)
Admission: EM | Admit: 2013-08-29 | Discharge: 2013-08-29 | Disposition: A | Discharge status: Home / Self Care | Payer: BC Managed Care – PPO | Attending: Emergency Medicine | Admitting: Emergency Medicine

## 2013-08-29 ENCOUNTER — Encounter (HOSPITAL_COMMUNITY): Payer: Self-pay | Admitting: Emergency Medicine

## 2013-08-29 DIAGNOSIS — G608 Other hereditary and idiopathic neuropathies: Secondary | ICD-10-CM | POA: Insufficient documentation

## 2013-08-29 DIAGNOSIS — Z8619 Personal history of other infectious and parasitic diseases: Secondary | ICD-10-CM | POA: Insufficient documentation

## 2013-08-29 DIAGNOSIS — F17200 Nicotine dependence, unspecified, uncomplicated: Secondary | ICD-10-CM | POA: Insufficient documentation

## 2013-08-29 DIAGNOSIS — I10 Essential (primary) hypertension: Secondary | ICD-10-CM | POA: Insufficient documentation

## 2013-08-29 DIAGNOSIS — J45909 Unspecified asthma, uncomplicated: Secondary | ICD-10-CM | POA: Insufficient documentation

## 2013-08-29 DIAGNOSIS — Z794 Long term (current) use of insulin: Secondary | ICD-10-CM | POA: Insufficient documentation

## 2013-08-29 DIAGNOSIS — G629 Polyneuropathy, unspecified: Secondary | ICD-10-CM

## 2013-08-29 DIAGNOSIS — Z79899 Other long term (current) drug therapy: Secondary | ICD-10-CM | POA: Insufficient documentation

## 2013-08-29 DIAGNOSIS — Z7982 Long term (current) use of aspirin: Secondary | ICD-10-CM | POA: Insufficient documentation

## 2013-08-29 DIAGNOSIS — M79609 Pain in unspecified limb: Secondary | ICD-10-CM | POA: Insufficient documentation

## 2013-08-29 DIAGNOSIS — Z85528 Personal history of other malignant neoplasm of kidney: Secondary | ICD-10-CM | POA: Insufficient documentation

## 2013-08-29 DIAGNOSIS — E119 Type 2 diabetes mellitus without complications: Secondary | ICD-10-CM | POA: Insufficient documentation

## 2013-08-29 MED ORDER — HYDROCODONE-ACETAMINOPHEN 5-325 MG PO TABS: 1.0000 | ORAL_TABLET | Freq: Four times a day (QID) | ORAL | Status: DC | PRN

## 2013-08-29 NOTE — Discharge Instructions (Signed)
Hydrocodone as prescribed as needed for pain.  Followup with your primary Dr. to discuss further management.   Peripheral Neuropathy Peripheral neuropathy is a type of nerve damage. It affects nerves that carry signals between the spinal cord and other parts of the body. These are called peripheral nerves. With peripheral neuropathy, one nerve or a group of nerves may be damaged.  CAUSES  Many things can damage peripheral nerves. For some people with peripheral neuropathy, the cause is unknown. Some causes include:  Diabetes. This is the most common cause of peripheral neuropathy.  Injury to a nerve.  Pressure or stress on a nerve that lasts a long time.  Too little vitamin B. Alcoholism can lead to this.  Infections.  Autoimmune diseases, such as multiple sclerosis and systemic lupus erythematosus.  Inherited nerve diseases.  Some medicines, such as cancer drugs.  Toxic substances, such as lead and mercury.  Too little blood flowing to the legs.  Kidney disease.  Thyroid disease. SIGNS AND SYMPTOMS  Different people have different symptoms. The symptoms you have will depend on which of your nerves is damaged. Common symptoms include:  Loss of feeling (numbness) in the feet and hands.  Tingling in the feet and hands.  Pain that burns.  Very sensitive skin.  Weakness.  Not being able to move a part of the body (paralysis).  Muscle twitching.  Clumsiness or poor coordination.  Loss of balance.  Not being able to control your bladder.  Feeling dizzy.  Sexual problems. DIAGNOSIS  Peripheral neuropathy is a symptom, not a disease. Finding the cause of peripheral neuropathy can be hard. To figure that out, your health care provider will take a medical history and do a physical exam. A neurological exam will also be done. This involves checking things affected by your brain, spinal cord, and nerves (nervous system). For example, your health care provider will  check your reflexes, how you move, and what you can feel.  Other types of tests may also be ordered, such as:  Blood tests.  A test of the fluid in your spinal cord.  Imaging tests, such as CT scans or an MRI.  Electromyography (EMG). This test checks the nerves that control muscles.  Nerve conduction velocity tests. These tests check how fast messages pass through your nerves.  Nerve biopsy. A small piece of nerve is removed. It is then checked under a microscope. TREATMENT   Medicine is often used to treat peripheral neuropathy. Medicines may include:  Pain-relieving medicines. Prescription or over-the-counter medicine may be suggested.  Antiseizure medicine. This may be used for pain.  Antidepressants. These also may help ease pain from neuropathy.  Lidocaine. This is a numbing medicine. You might wear a patch or be given a shot.  Mexiletine. This medicine is typically used to help control irregular heart rhythms.  Surgery. Surgery may be needed to relieve pressure on a nerve or to destroy a nerve that is causing pain.  Physical therapy to help movement.  Assistive devices to help movement. HOME CARE INSTRUCTIONS   Only take over-the-counter or prescription medicines as directed by your health care provider. Follow the instructions carefully for any given medicines. Do not take any other medicines without first getting approval from your health care provider.  If you have diabetes, work closely with your health care provider to keep your blood sugar under control.  If you have numbness in your feet:  Check every day for signs of injury or infection. Watch for redness,  warmth, and swelling.  Wear padded socks and comfortable shoes. These help protect your feet.  Do not do things that put pressure on your damaged nerve.  Do not smoke. Smoking keeps blood from getting to damaged nerves.  Avoid or limit alcohol. Too much alcohol can cause a lack of B vitamins. These  vitamins are needed for healthy nerves.  Develop a good support system. Coping with peripheral neuropathy can be stressful. Talk to a mental health specialist or join a support group if you are struggling.  Follow up with your health care provider as directed. SEEK MEDICAL CARE IF:   You have new signs or symptoms of peripheral neuropathy.  You are struggling emotionally from dealing with peripheral neuropathy.  You have a fever. SEEK IMMEDIATE MEDICAL CARE IF:   You have an injury or infection that is not healing.  You feel very dizzy or begin vomiting.  You have chest pain.  You have trouble breathing. Document Released: 12/29/2001 Document Revised: 09/20/2010 Document Reviewed: 09/15/2012 [facility] Patient Information 2015 [place], [place]. This information is not intended to replace advice given to you by your health care provider. Make sure you discuss any questions you have with your health care provider.

## 2013-08-29 NOTE — ED Notes (Signed)
Patient with no complaints at this time. Respirations even and unlabored. Skin warm/dry. Discharge instructions reviewed with patient at this time. Patient given opportunity to voice concerns/ask questions. Patient discharged at this time and left Emergency Department with steady gait.   

## 2013-08-29 NOTE — ED Provider Notes (Signed)
CSN: [id number]     Arrival date & time 08/29/13  1218 History  This chart was scribed for [name], MD by Ludger Nutting, ED Scribe. This patient was seen in room [facility] and the patient's care was started 1:07 PM.    Chief Complaint  Patient presents with  . Leg Pain    The history is provided by the patient. No language interpreter was used.    HPI Comments: Cassandra Jordan is a 63 y.o. female who presents to the Emergency Department complaining of chronic, constant bilateral lower leg pain that worsened today. Patient reports a history of DM, HTN, neuropathy, and renal carcinoma. Patient states she takes gabapentin for this pain but it is not providing relief. She states her PCP is aware of this but has not made changes to her medications. She denies back pain, bowel or bladder incontinence.   Past Medical History  Diagnosis Date  . Diabetes mellitus without complication   . Hypertension   . Asthma   . Neuropathic pain   . Hepatitis C   . Renal carcinoma    Past Surgical History  Procedure Laterality Date  . Abdominal hysterectomy    . Wart removal    . Colonoscopy N/A 07/08/2013    Procedure: COLONOSCOPY;  Surgeon: [name], MD;  [facility];  Service: Endoscopy;  Laterality: N/A;  7:30  . Esophagogastroduodenoscopy N/A 07/08/2013    Procedure: ESOPHAGOGASTRODUODENOSCOPY (EGD);  Surgeon: [name], MD;  [facility];  Service: Endoscopy;  Laterality: N/A;  . Savory dilation N/A 07/08/2013    Procedure: SAVORY DILATION;  Surgeon: [name], MD;  [facility];  Service: Endoscopy;  Laterality: N/A;  [name] dilation N/A 07/08/2013    Procedure: [name] DILATION;  Surgeon: [name], MD;  [facility];  Service: Endoscopy;  Laterality: N/A;   Family History  Problem Relation Age of Onset  . Colon cancer Sister 54   History  Substance Use Topics  . Smoking status: Current Every Day Smoker  . Smokeless  tobacco: Not on file  . Alcohol Use: Yes     Comment: twice a week sometimes, drinks 2-3 40 oz.    OB History   Grav Para Term Preterm Abortions TAB SAB Ect Mult Living                 Review of Systems  A complete 10 system review of systems was obtained and all systems are negative except as noted in the HPI and PMH.    Allergies  Sulfa antibiotics  Home Medications   Prior to Admission medications   Medication Sig Start Date End Date Taking? Authorizing Provider  aspirin EC 81 MG tablet Take 81 mg by mouth daily.    Historical Provider, MD  gabapentin (NEURONTIN) 600 MG tablet Take 600 mg by mouth 3 (three) times daily.    Historical Provider, MD  hydrocortisone (ANUSOL-HC) 2.5 % rectal cream Place 1 application rectally 2 (two) times daily. 07/29/13   Orvil Feil, NP  insulin detemir (LEVEMIR) 100 UNIT/ML injection Inject 30 Units into the skin 2 (two) times daily.    Historical Provider, MD  insulin NPH Human (HUMULIN N,NOVOLIN N) 100 UNIT/ML injection Inject 6 Units into the skin 3 (three) times daily with meals.    Historical Provider, MD  metoprolol tartrate (LOPRESSOR) 25 MG tablet Take 25 mg by mouth daily.    Historical Provider, MD  BP 162/99  Pulse 97  Temp(Src) 98.2 F (36.8 C) (Oral)  Ht 5' 2"  (1.575 m)  Wt 159 lb (72.122 kg)  BMI 29.07 kg/m2  SpO2 98% Physical Exam  Nursing note and vitals reviewed. Constitutional: She is oriented to person, place, and time. She appears well-developed and well-nourished.  HENT:  Head: Normocephalic and atraumatic.  Cardiovascular: Normal rate.   Pulmonary/Chest: Effort normal.  Abdominal: She exhibits no distension.  Musculoskeletal: She exhibits no edema.  Bilateral lower extremities appear grossly normal. No swelling or edema.   Neurological: She is alert and oriented to person, place, and time.  DTR's are trace and equal bilaterally. Strength is 5/5 in BLE.  Skin: Skin is warm and dry.  Psychiatric: She has a normal  mood and affect.    ED Course  Procedures (including critical care time)  DIAGNOSTIC STUDIES: Oxygen Saturation is 98% on RA, normal by my interpretation.    COORDINATION OF CARE: 1:12 PM Discussed treatment plan with pt at bedside and pt agreed to plan.   Labs Review Labs Reviewed - No data to display  Imaging Review No results found.   EKG Interpretation None      MDM   Final diagnoses:  None    Patient presents with complaints of pain related to her peripheral neuropathy from diabetes. Her physical exam is unremarkable. She will be treated with pain medication and followup with her primary Dr. to discuss further medications or referrals. Nothing today appears emergent.  I personally performed the services described in this documentation, which was scribed in my presence. The recorded information has been reviewed and is accurate.      [name], MD 08/29/13 1430

## 2013-08-29 NOTE — ED Notes (Signed)
Pt c/o foot/leg pain from neuropathy. Pt states her medication for her neuropathy is not working.

## 2013-09-04 ENCOUNTER — Other Ambulatory Visit: Payer: Self-pay | Admitting: Urology

## 2013-09-16 ENCOUNTER — Encounter (HOSPITAL_COMMUNITY): Payer: Self-pay | Admitting: Emergency Medicine

## 2013-09-16 ENCOUNTER — Emergency Department (HOSPITAL_COMMUNITY)
Admission: EM | Admit: 2013-09-16 | Discharge: 2013-09-16 | Disposition: A | Discharge status: Home / Self Care | Payer: BC Managed Care – PPO | Attending: Emergency Medicine | Admitting: Emergency Medicine

## 2013-09-16 DIAGNOSIS — J45909 Unspecified asthma, uncomplicated: Secondary | ICD-10-CM | POA: Diagnosis not present

## 2013-09-16 DIAGNOSIS — Z8619 Personal history of other infectious and parasitic diseases: Secondary | ICD-10-CM | POA: Diagnosis not present

## 2013-09-16 DIAGNOSIS — Z85528 Personal history of other malignant neoplasm of kidney: Secondary | ICD-10-CM | POA: Diagnosis not present

## 2013-09-16 DIAGNOSIS — F17200 Nicotine dependence, unspecified, uncomplicated: Secondary | ICD-10-CM | POA: Diagnosis not present

## 2013-09-16 DIAGNOSIS — Z794 Long term (current) use of insulin: Secondary | ICD-10-CM | POA: Insufficient documentation

## 2013-09-16 DIAGNOSIS — Z79899 Other long term (current) drug therapy: Secondary | ICD-10-CM | POA: Diagnosis not present

## 2013-09-16 DIAGNOSIS — G609 Hereditary and idiopathic neuropathy, unspecified: Secondary | ICD-10-CM | POA: Insufficient documentation

## 2013-09-16 DIAGNOSIS — E119 Type 2 diabetes mellitus without complications: Secondary | ICD-10-CM | POA: Insufficient documentation

## 2013-09-16 DIAGNOSIS — G629 Polyneuropathy, unspecified: Secondary | ICD-10-CM

## 2013-09-16 DIAGNOSIS — M79609 Pain in unspecified limb: Secondary | ICD-10-CM | POA: Insufficient documentation

## 2013-09-16 DIAGNOSIS — I10 Essential (primary) hypertension: Secondary | ICD-10-CM | POA: Diagnosis not present

## 2013-09-16 MED ORDER — HYDROCODONE-ACETAMINOPHEN 5-325 MG PO TABS: ORAL_TABLET | ORAL | Status: DC

## 2013-09-16 NOTE — Discharge Instructions (Signed)

## 2013-09-16 NOTE — ED Notes (Addendum)
Pt here for BLE pain from neuropathy. Pt reports she was started on Lyrica by PCP for her chronic neuropathy pain and that it worked for "a few days". Pt appeared agitated when ask if she followed up with her PCP after beginning the Lyrica, answering in short pressured sentences.

## 2013-09-19 NOTE — ED Provider Notes (Signed)
CSN: [id number]     Arrival date & time 09/16/13  1710 History   First MD Initiated Contact with Patient 09/16/13 1838     Chief Complaint  Patient presents with  . Foot Pain   Cassandra Jordan is a 63 y.o. female who presents to the Emergency Department complaining of recurrent bilateral foot pain.  She states that her PMD recently started her on Lyrica for neuropathy of her feet and she states that it is not helping control the burning, stinging sensation she has to both feet.    (Consider location/radiation/quality/duration/timing/severity/associated sxs/prior Treatment) Patient is a 63 y.o. female presenting with lower extremity pain.  Foot Pain This is a chronic problem. The current episode started 1 to 4 weeks ago. The problem occurs constantly. The problem has been unchanged. Associated symptoms include arthralgias. Pertinent negatives include no chills, fever, headaches, joint swelling, numbness, rash, vomiting or weakness. The symptoms are aggravated by walking. She has tried nothing for the symptoms. The treatment provided no relief.    Past Medical History  Diagnosis Date  . Diabetes mellitus without complication   . Hypertension   . Asthma   . Neuropathic pain   . Hepatitis C   . Renal carcinoma    Past Surgical History  Procedure Laterality Date  . Abdominal hysterectomy    . Wart removal    . Colonoscopy N/A 07/08/2013    Procedure: COLONOSCOPY;  Surgeon: [name], MD;  [facility];  Service: Endoscopy;  Laterality: N/A;  7:30  . Esophagogastroduodenoscopy N/A 07/08/2013    Procedure: ESOPHAGOGASTRODUODENOSCOPY (EGD);  Surgeon: [name], MD;  [facility];  Service: Endoscopy;  Laterality: N/A;  . Savory dilation N/A 07/08/2013    Procedure: SAVORY DILATION;  Surgeon: [name], MD;  [facility];  Service: Endoscopy;  Laterality: N/A;  [name] dilation N/A 07/08/2013    Procedure: [name] DILATION;  Surgeon: [name], MD;  [facility];  Service: Endoscopy;  Laterality: N/A;   Family History  Problem Relation Age of Onset  . Colon cancer Sister 51   History  Substance Use Topics  . Smoking status: Current Every Day Smoker  . Smokeless tobacco: Not on file  . Alcohol Use: Yes     Comment: twice a week sometimes, drinks 2-3 40 oz.    OB History   Grav Para Term Preterm Abortions TAB SAB Ect Mult Living                 Review of Systems  Constitutional: Negative for fever and chills.  Gastrointestinal: Negative for vomiting.  Genitourinary: Negative for dysuria and difficulty urinating.  Musculoskeletal: Positive for arthralgias. Negative for back pain, gait problem and joint swelling.       Bilateral foot pain  Skin: Negative for color change, rash and wound.  Neurological: Negative for dizziness, weakness, numbness and headaches.  All other systems reviewed and are negative.     Allergies  Sulfa antibiotics  Home Medications   Prior to Admission medications   Medication Sig Start Date End Date Taking? Authorizing Provider  insulin detemir (LEVEMIR) 100 UNIT/ML injection Inject 30 Units into the skin 2 (two) times daily.   Yes Historical Provider, MD  insulin NPH Human (HUMULIN N,NOVOLIN N) 100 UNIT/ML injection Inject 6 Units into the skin 3 (three) times daily with meals.   Yes Historical Provider, MD  metoprolol tartrate (LOPRESSOR) 25 MG tablet Take 25 mg  by mouth daily.   Yes Historical Provider, MD  pregabalin (LYRICA) 75 MG capsule Take 75 mg by mouth 2 (two) times daily.   Yes Historical Provider, MD  HYDROcodone-acetaminophen (NORCO/VICODIN) 5-325 MG per tablet Take one-two tabs po q 4-6 hrs prn pain 09/16/13   Sterling Mondo L. Cameshia Cressman, PA-C   BP 151/82  Pulse 99  Temp(Src) 98.6 F (37 C) (Oral)  Resp 18  Ht 5' (1.524 m)  Wt 156 lb (70.761 kg)  BMI 30.47 kg/m2  SpO2 99% Physical Exam  Nursing note and vitals reviewed. Constitutional: She is oriented to  person, place, and time. She appears well-developed and well-nourished. No distress.  HENT:  Head: Normocephalic and atraumatic.  Cardiovascular: Normal rate, regular rhythm, normal heart sounds and intact distal pulses.   No murmur heard. Pulmonary/Chest: Effort normal and breath sounds normal. No respiratory distress.  Musculoskeletal: She exhibits no edema and no tenderness.  Pt points to dorsal and plantar surfaces of both feet as areas of pain.  ROM is preserved.  DP pulses are brisk,distal sensation intact.  No erythema, edema, skin lesions,  bruising or bony deformity.  No proximal tenderness or edema.  Neurological: She is alert and oriented to person, place, and time. She exhibits normal muscle tone. Coordination normal.  Skin: Skin is warm and dry.    ED Course  Procedures (including critical care time) Labs Review Labs Reviewed - No data to display  Imaging Review No results found.   EKG Interpretation None      MDM   Final diagnoses:  Peripheral neuropathy   Previous ED charts reviewed.  Patient has been evaluated here for this several times previously.   She has been on gabapentin for this and was recently started on Lyrica.  No concerning sx's on exam for infectious process or lower leg sx's to suggest DVT.  I have advised her that she will need to arrange close f/u with her PMD for this chronic issue .  She verbalized understanding and agrees.  She appears stable for d/c    Indianna Boran L. Riyan Gavina, PA-C 09/19/13 0109

## 2013-09-24 ENCOUNTER — Telehealth (HOSPITAL_COMMUNITY): Payer: Self-pay

## 2013-09-24 NOTE — ED Notes (Signed)
Unable to contact pt by mail or telephone. Unable to communicate lab results or treatment changes.

## 2013-09-24 NOTE — ED Provider Notes (Signed)
Medical screening examination/treatment/procedure(s) were performed by non-physician practitioner and as supervising physician I was immediately available for consultation/collaboration.   EKG Interpretation None        Tanna Furry, MD 09/24/13 2140

## 2013-10-06 ENCOUNTER — Encounter (HOSPITAL_COMMUNITY): Payer: Self-pay | Admitting: Pharmacy Technician

## 2013-10-07 ENCOUNTER — Other Ambulatory Visit (HOSPITAL_COMMUNITY): Payer: Self-pay | Admitting: Anesthesiology

## 2013-10-08 ENCOUNTER — Encounter (HOSPITAL_COMMUNITY)
Admission: RE | Admit: 2013-10-08 | Discharge: 2013-10-08 | Disposition: A | Discharge status: Home / Self Care | Payer: BC Managed Care – PPO | Source: Ambulatory Visit | Attending: Urology | Admitting: Urology

## 2013-10-08 ENCOUNTER — Ambulatory Visit (HOSPITAL_COMMUNITY)
Admission: RE | Admit: 2013-10-08 | Discharge: 2013-10-08 | Disposition: A | Discharge status: Home / Self Care | Payer: BC Managed Care – PPO | Source: Ambulatory Visit | Attending: Anesthesiology | Admitting: Anesthesiology

## 2013-10-08 ENCOUNTER — Encounter (HOSPITAL_COMMUNITY): Payer: Self-pay

## 2013-10-08 DIAGNOSIS — Z01818 Encounter for other preprocedural examination: Secondary | ICD-10-CM | POA: Diagnosis present

## 2013-10-08 DIAGNOSIS — Z87891 Personal history of nicotine dependence: Secondary | ICD-10-CM | POA: Insufficient documentation

## 2013-10-08 DIAGNOSIS — E119 Type 2 diabetes mellitus without complications: Secondary | ICD-10-CM | POA: Diagnosis not present

## 2013-10-08 HISTORY — DX: Gastro-esophageal reflux disease without esophagitis: K21.9

## 2013-10-08 HISTORY — DX: Unspecified osteoarthritis, unspecified site: M19.90

## 2013-10-08 HISTORY — DX: Hyperlipidemia, unspecified: E78.5

## 2013-10-08 HISTORY — DX: Rash and other nonspecific skin eruption: R21

## 2013-10-08 LAB — COMPREHENSIVE METABOLIC PANEL
ALT: 112 U/L — AB (ref 0–35)
AST: 135 U/L — ABNORMAL HIGH (ref 0–37)
Albumin: 3 g/dL — ABNORMAL LOW (ref 3.5–5.2)
Alkaline Phosphatase: 118 U/L — ABNORMAL HIGH (ref 39–117)
Anion gap: 9 (ref 5–15)
BILIRUBIN TOTAL: 0.3 mg/dL (ref 0.3–1.2)
BUN: 9 mg/dL (ref 6–23)
CHLORIDE: 100 meq/L (ref 96–112)
CO2: 28 meq/L (ref 19–32)
Calcium: 9 mg/dL (ref 8.4–10.5)
Creatinine, Ser: 0.6 mg/dL (ref 0.50–1.10)
GLUCOSE: 263 mg/dL — AB (ref 70–99)
Potassium: 5.5 mEq/L — ABNORMAL HIGH (ref 3.7–5.3)
Sodium: 137 mEq/L (ref 137–147)
Total Protein: 7.9 g/dL (ref 6.0–8.3)

## 2013-10-08 LAB — CBC
HCT: 41.4 % (ref 36.0–46.0)
HEMOGLOBIN: 13.3 g/dL (ref 12.0–15.0)
MCH: 28.5 pg (ref 26.0–34.0)
MCHC: 32.1 g/dL (ref 30.0–36.0)
MCV: 88.8 fL (ref 78.0–100.0)
Platelets: 170 10*3/uL (ref 150–400)
RBC: 4.66 MIL/uL (ref 3.87–5.11)
RDW: 13.2 % (ref 11.5–15.5)
WBC: 8 10*3/uL (ref 4.0–10.5)

## 2013-10-08 LAB — ABO/RH: ABO/RH(D): A POS

## 2013-10-08 NOTE — Patient Instructions (Addendum)
Jaleia T Siwek  10/08/2013                           YOUR PROCEDURE IS SCHEDULED ON:  10/14/13               ENTER THRU [facility]IN [facility] ENTRANCE AND                            FOLLOW  SIGNS TO SHORT STAY CENTER                 ARRIVE AT SHORT STAY AT:  10:45 AM               CALL THIS NUMBER IF ANY PROBLEMS THE DAY OF SURGERY :               [phone]                                REMEMBER:   Do not eat food or drink liquids AFTER MIDNIGHT                  Take these medicines the morning of surgery with               A SIPS OF WATER :    LYRICA / TAKE ONLY 1/2 DOSE OF LEVEMIR THE NIGHT BEFORE SURGERY   Do not wear jewelry, make-up   Do not wear lotions, powders, or perfumes.   Do not shave legs or underarms 12 hrs. before surgery (men may shave face)  Do not bring valuables to the hospital.  Contacts, dentures or bridgework may not be worn into surgery.  Leave suitcase in the car. After surgery it may be brought to your room.  For patients admitted to the hospital more than one night, checkout time is            11:00 AM                                                  ________________________________________________________________________                                                      [place]  Before surgery, you can play an important role.  Because skin is not sterile, your skin needs to be as free of germs as possible.  You can reduce the number of germs on your skin by washing with CHG (chlorahexidine gluconate) soap before surgery.  CHG is an antiseptic cleaner which kills germs and bonds with the skin to continue killing germs even after washing. Please DO NOT use if you have an allergy to CHG or antibacterial soaps.  If your skin becomes reddened/irritated stop using the CHG and inform your nurse when you arrive at Short Stay. Do not shave (including legs and underarms) for at least 48 hours prior to the first  CHG shower.  You may shave your face. Please follow these instructions carefully:   1.  Shower with CHG  Soap the night before surgery and the  morning of Surgery.   2.  If you choose to wash your hair, wash your hair first as usual with your  normal  Shampoo.   3.  After you shampoo, rinse your hair and body thoroughly to remove the  shampoo.                                         4.  Use CHG as you would any other liquid soap.  You can apply chg directly  to the skin and wash . Gently wash with scrungie or clean wascloth    5.  Apply the CHG Soap to your body ONLY FROM THE NECK DOWN.   Do not use on open                           Wound or open sores. Avoid contact with eyes, ears mouth and genitals (private parts).                        Genitals (private parts) with your normal soap.              6.  Wash thoroughly, paying special attention to the area where your surgery  will be performed.   7.  Thoroughly rinse your body with warm water from the neck down.   8.  DO NOT shower/wash with your normal soap after using and rinsing off  the CHG Soap .                9.  Pat yourself dry with a clean towel.             10.  Wear clean pajamas.             11.  Place clean sheets on your bed the night of your first shower and do not  sleep with pets.  Day of Surgery : Do not apply any lotions/deodorants the morning of surgery.  Please wear clean clothes to the hospital/surgery center.  FAILURE TO FOLLOW THESE INSTRUCTIONS MAY RESULT IN THE CANCELLATION OF YOUR SURGERY    PATIENT SIGNATURE_________________________________  ______________________________________________________________________

## 2013-10-09 NOTE — Progress Notes (Signed)
Abnormal CMET faxed to Dr. Tresa Moore

## 2013-10-14 ENCOUNTER — Encounter (HOSPITAL_COMMUNITY): Payer: BC Managed Care – PPO | Admitting: Anesthesiology

## 2013-10-14 ENCOUNTER — Inpatient Hospital Stay (HOSPITAL_COMMUNITY): Payer: BC Managed Care – PPO | Admitting: Anesthesiology

## 2013-10-14 ENCOUNTER — Encounter (HOSPITAL_COMMUNITY): Payer: Self-pay | Admitting: Anesthesiology

## 2013-10-14 ENCOUNTER — Other Ambulatory Visit: Payer: Self-pay

## 2013-10-14 ENCOUNTER — Encounter (HOSPITAL_COMMUNITY)
Admission: RE | Disposition: A | Discharge status: Home / Self Care | Payer: Self-pay | Source: Ambulatory Visit | Attending: Urology

## 2013-10-14 ENCOUNTER — Inpatient Hospital Stay (HOSPITAL_COMMUNITY)
Admission: RE | Admit: 2013-10-14 | Discharge: 2013-10-20 | DRG: 657 | Disposition: A | Discharge status: Home / Self Care | Payer: BC Managed Care – PPO | Source: Ambulatory Visit | Attending: Urology | Admitting: Urology

## 2013-10-14 DIAGNOSIS — J45909 Unspecified asthma, uncomplicated: Secondary | ICD-10-CM | POA: Diagnosis present

## 2013-10-14 DIAGNOSIS — R945 Abnormal results of liver function studies: Secondary | ICD-10-CM

## 2013-10-14 DIAGNOSIS — I959 Hypotension, unspecified: Secondary | ICD-10-CM | POA: Diagnosis present

## 2013-10-14 DIAGNOSIS — Z8619 Personal history of other infectious and parasitic diseases: Secondary | ICD-10-CM

## 2013-10-14 DIAGNOSIS — I10 Essential (primary) hypertension: Secondary | ICD-10-CM | POA: Diagnosis present

## 2013-10-14 DIAGNOSIS — C649 Malignant neoplasm of unspecified kidney, except renal pelvis: Principal | ICD-10-CM | POA: Diagnosis present

## 2013-10-14 DIAGNOSIS — R7989 Other specified abnormal findings of blood chemistry: Secondary | ICD-10-CM

## 2013-10-14 DIAGNOSIS — I471 Supraventricular tachycardia, unspecified: Secondary | ICD-10-CM | POA: Diagnosis present

## 2013-10-14 DIAGNOSIS — K219 Gastro-esophageal reflux disease without esophagitis: Secondary | ICD-10-CM | POA: Diagnosis present

## 2013-10-14 DIAGNOSIS — Z6836 Body mass index (BMI) 36.0-36.9, adult: Secondary | ICD-10-CM | POA: Diagnosis not present

## 2013-10-14 DIAGNOSIS — I498 Other specified cardiac arrhythmias: Secondary | ICD-10-CM | POA: Diagnosis present

## 2013-10-14 DIAGNOSIS — I491 Atrial premature depolarization: Secondary | ICD-10-CM | POA: Diagnosis present

## 2013-10-14 DIAGNOSIS — D62 Acute posthemorrhagic anemia: Secondary | ICD-10-CM | POA: Diagnosis not present

## 2013-10-14 DIAGNOSIS — N83209 Unspecified ovarian cyst, unspecified side: Secondary | ICD-10-CM | POA: Diagnosis present

## 2013-10-14 DIAGNOSIS — F17200 Nicotine dependence, unspecified, uncomplicated: Secondary | ICD-10-CM | POA: Diagnosis present

## 2013-10-14 DIAGNOSIS — E785 Hyperlipidemia, unspecified: Secondary | ICD-10-CM | POA: Diagnosis present

## 2013-10-14 DIAGNOSIS — R339 Retention of urine, unspecified: Secondary | ICD-10-CM | POA: Diagnosis not present

## 2013-10-14 DIAGNOSIS — Z794 Long term (current) use of insulin: Secondary | ICD-10-CM | POA: Diagnosis not present

## 2013-10-14 DIAGNOSIS — N2889 Other specified disorders of kidney and ureter: Secondary | ICD-10-CM | POA: Diagnosis present

## 2013-10-14 DIAGNOSIS — E119 Type 2 diabetes mellitus without complications: Secondary | ICD-10-CM | POA: Diagnosis present

## 2013-10-14 DIAGNOSIS — Q619 Cystic kidney disease, unspecified: Secondary | ICD-10-CM

## 2013-10-14 DIAGNOSIS — Z800 Family history of malignant neoplasm of digestive organs: Secondary | ICD-10-CM | POA: Diagnosis not present

## 2013-10-14 DIAGNOSIS — E669 Obesity, unspecified: Secondary | ICD-10-CM | POA: Diagnosis present

## 2013-10-14 DIAGNOSIS — N289 Disorder of kidney and ureter, unspecified: Secondary | ICD-10-CM | POA: Diagnosis present

## 2013-10-14 DIAGNOSIS — E875 Hyperkalemia: Secondary | ICD-10-CM | POA: Diagnosis present

## 2013-10-14 DIAGNOSIS — F1210 Cannabis abuse, uncomplicated: Secondary | ICD-10-CM | POA: Diagnosis present

## 2013-10-14 HISTORY — PX: ROBOT ASSISTED LAPAROSCOPIC NEPHRECTOMY: SHX5140

## 2013-10-14 LAB — HEMOGLOBIN AND HEMATOCRIT, BLOOD
HCT: 38 % (ref 36.0–46.0)
Hemoglobin: 12.2 g/dL (ref 12.0–15.0)

## 2013-10-14 LAB — BASIC METABOLIC PANEL
Anion gap: 12 (ref 5–15)
BUN: 9 mg/dL (ref 6–23)
CALCIUM: 8.3 mg/dL — AB (ref 8.4–10.5)
CO2: 21 meq/L (ref 19–32)
CREATININE: 0.8 mg/dL (ref 0.50–1.10)
Chloride: 99 mEq/L (ref 96–112)
GFR calc Af Amer: 90 mL/min — ABNORMAL LOW (ref >90)
GFR, EST NON AFRICAN AMERICAN: 77 mL/min — AB (ref >90)
Glucose, Bld: 161 mg/dL — ABNORMAL HIGH (ref 70–99)
Potassium: 5.7 mEq/L — ABNORMAL HIGH (ref 3.7–5.3)
SODIUM: 132 meq/L — AB (ref 137–147)

## 2013-10-14 LAB — GLUCOSE, CAPILLARY
Glucose-Capillary: 143 mg/dL — ABNORMAL HIGH (ref 70–99)
Glucose-Capillary: 127 mg/dL — ABNORMAL HIGH (ref 70–99)
Glucose-Capillary: 125 mg/dL — ABNORMAL HIGH (ref 70–99)

## 2013-10-14 LAB — MAGNESIUM: MAGNESIUM: 1.7 mg/dL (ref 1.5–2.5)

## 2013-10-14 LAB — MRSA PCR SCREENING: MRSA BY PCR: NEGATIVE

## 2013-10-14 SURGERY — ROBOTIC ASSISTED LAPAROSCOPIC NEPHRECTOMY
Anesthesia: General | Laterality: Right

## 2013-10-14 MED ORDER — CEFAZOLIN SODIUM-DEXTROSE 2-3 GM-% IV SOLR
2.0000 g | INTRAVENOUS | Status: AC
  Administered 2013-10-14: 2 g via INTRAVENOUS

## 2013-10-14 MED ORDER — SUFENTANIL CITRATE 50 MCG/ML IV SOLN
INTRAVENOUS | Status: AC
  Filled 2013-10-14: qty 1

## 2013-10-14 MED ORDER — LIDOCAINE HCL (CARDIAC) 20 MG/ML IV SOLN
INTRAVENOUS | Status: AC
  Filled 2013-10-14: qty 5

## 2013-10-14 MED ORDER — PROMETHAZINE HCL 25 MG/ML IJ SOLN
INTRAMUSCULAR | Status: AC
Start: 2013-10-14 — End: 2013-10-15
  Filled 2013-10-14: qty 1

## 2013-10-14 MED ORDER — STERILE WATER FOR IRRIGATION IR SOLN
Status: DC | PRN
  Administered 2013-10-14: 3000 mL

## 2013-10-14 MED ORDER — LABETALOL HCL 5 MG/ML IV SOLN
INTRAVENOUS | Status: AC
  Filled 2013-10-14: qty 4

## 2013-10-14 MED ORDER — ATROPINE SULFATE 1 MG/ML IJ SOLN
0.5000 mg | Freq: Once | INTRAMUSCULAR | Status: AC
  Administered 2013-10-14: 0.5 mg via INTRAVENOUS

## 2013-10-14 MED ORDER — LABETALOL HCL 5 MG/ML IV SOLN
INTRAVENOUS | Status: AC
Start: 2013-10-14 — End: 2013-10-15
  Filled 2013-10-14: qty 4

## 2013-10-14 MED ORDER — LABETALOL HCL 5 MG/ML IV SOLN
5.0000 mg | INTRAVENOUS | Status: DC | PRN
  Administered 2013-10-14 (×3): 5 mg via INTRAVENOUS

## 2013-10-14 MED ORDER — HYDROMORPHONE HCL 1 MG/ML IJ SOLN
INTRAMUSCULAR | Status: AC
  Filled 2013-10-14: qty 1

## 2013-10-14 MED ORDER — SODIUM CHLORIDE 0.9 % IV SOLN
INTRAVENOUS | Status: DC
  Administered 2013-10-15 – 2013-10-18 (×6): via INTRAVENOUS

## 2013-10-14 MED ORDER — BUPIVACAINE LIPOSOME 1.3 % IJ SUSP
20.0000 mL | Freq: Once | INTRAMUSCULAR | Status: AC
  Administered 2013-10-14: 20 mL
  Filled 2013-10-14: qty 20

## 2013-10-14 MED ORDER — PHENYLEPHRINE 40 MCG/ML (10ML) SYRINGE FOR IV PUSH (FOR BLOOD PRESSURE SUPPORT)
PREFILLED_SYRINGE | INTRAVENOUS | Status: AC
  Filled 2013-10-14: qty 10

## 2013-10-14 MED ORDER — ONDANSETRON HCL 4 MG/2ML IJ SOLN
INTRAMUSCULAR | Status: AC
  Filled 2013-10-14: qty 2

## 2013-10-14 MED ORDER — INSULIN ASPART 100 UNIT/ML ~~LOC~~ SOLN
0.0000 [IU] | Freq: Three times a day (TID) | SUBCUTANEOUS | Status: DC
  Administered 2013-10-15 (×2): 5 [IU] via SUBCUTANEOUS
  Administered 2013-10-15: 3 [IU] via SUBCUTANEOUS
  Administered 2013-10-16 – 2013-10-19 (×4): 2 [IU] via SUBCUTANEOUS

## 2013-10-14 MED ORDER — ATROPINE SULFATE 1 MG/ML IJ SOLN
INTRAMUSCULAR | Status: AC
  Filled 2013-10-14: qty 1

## 2013-10-14 MED ORDER — LACTATED RINGERS IV SOLN
INTRAVENOUS | Status: DC
  Administered 2013-10-14 (×2): via INTRAVENOUS
  Administered 2013-10-14: 1000 mL via INTRAVENOUS

## 2013-10-14 MED ORDER — PHENYLEPHRINE HCL 10 MG/ML IJ SOLN
INTRAMUSCULAR | Status: DC | PRN
  Administered 2013-10-14 (×2): 80 ug via INTRAVENOUS

## 2013-10-14 MED ORDER — ACETAMINOPHEN 500 MG PO TABS
1000.0000 mg | ORAL_TABLET | Freq: Four times a day (QID) | ORAL | Status: DC
  Administered 2013-10-14 – 2013-10-15 (×3): 1000 mg via ORAL
  Filled 2013-10-14 (×3): qty 2

## 2013-10-14 MED ORDER — GLYCOPYRROLATE 0.2 MG/ML IJ SOLN
INTRAMUSCULAR | Status: DC | PRN
  Administered 2013-10-14: 0.4 mg via INTRAVENOUS

## 2013-10-14 MED ORDER — SODIUM CHLORIDE 0.9 % IJ SOLN
INTRAMUSCULAR | Status: AC
  Filled 2013-10-14: qty 20

## 2013-10-14 MED ORDER — PROPOFOL 10 MG/ML IV BOLUS
INTRAVENOUS | Status: AC
  Filled 2013-10-14: qty 20

## 2013-10-14 MED ORDER — ATROPINE SULFATE 1 MG/ML IJ SOLN
INTRAMUSCULAR | Status: AC
Start: 2013-10-14 — End: 2013-10-15
  Filled 2013-10-14: qty 1

## 2013-10-14 MED ORDER — ROCURONIUM BROMIDE 100 MG/10ML IV SOLN
INTRAVENOUS | Status: DC | PRN
  Administered 2013-10-14: 10 mg via INTRAVENOUS
  Administered 2013-10-14: 70 mg via INTRAVENOUS

## 2013-10-14 MED ORDER — MAGNESIUM SULFATE 40 MG/ML IJ SOLN
2.0000 g | Freq: Once | INTRAMUSCULAR | Status: AC
  Administered 2013-10-14: 2 g via INTRAVENOUS
  Filled 2013-10-14: qty 50

## 2013-10-14 MED ORDER — PROPOFOL 10 MG/ML IV BOLUS
INTRAVENOUS | Status: DC | PRN
  Administered 2013-10-14: 160 mg via INTRAVENOUS

## 2013-10-14 MED ORDER — HYDROMORPHONE HCL 1 MG/ML IJ SOLN
0.2500 mg | INTRAMUSCULAR | Status: DC | PRN
  Administered 2013-10-14 (×2): 0.5 mg via INTRAVENOUS

## 2013-10-14 MED ORDER — ONDANSETRON HCL 4 MG/2ML IJ SOLN
INTRAMUSCULAR | Status: DC | PRN
  Administered 2013-10-14: 4 mg via INTRAVENOUS

## 2013-10-14 MED ORDER — PNEUMOCOCCAL VAC POLYVALENT 25 MCG/0.5ML IJ INJ
0.5000 mL | INJECTION | INTRAMUSCULAR | Status: DC
  Filled 2013-10-14 (×5): qty 0.5

## 2013-10-14 MED ORDER — ALBUMIN HUMAN 5 % IV SOLN
12.5000 g | Freq: Once | INTRAVENOUS | Status: AC
  Administered 2013-10-15: 12.5 g via INTRAVENOUS
  Filled 2013-10-14: qty 250

## 2013-10-14 MED ORDER — SODIUM CHLORIDE 0.45 % IV SOLN
INTRAVENOUS | Status: DC
  Administered 2013-10-14: 125 mL/h via INTRAVENOUS

## 2013-10-14 MED ORDER — INSULIN DETEMIR 100 UNIT/ML ~~LOC~~ SOLN
30.0000 [IU] | Freq: Two times a day (BID) | SUBCUTANEOUS | Status: DC
  Administered 2013-10-14 – 2013-10-20 (×8): 30 [IU] via SUBCUTANEOUS
  Filled 2013-10-14 (×12): qty 0.3

## 2013-10-14 MED ORDER — CEFAZOLIN SODIUM-DEXTROSE 2-3 GM-% IV SOLR
INTRAVENOUS | Status: AC
  Filled 2013-10-14: qty 50

## 2013-10-14 MED ORDER — MIDAZOLAM HCL 2 MG/2ML IJ SOLN
INTRAMUSCULAR | Status: AC
  Filled 2013-10-14: qty 2

## 2013-10-14 MED ORDER — PREGABALIN 75 MG PO CAPS
75.0000 mg | ORAL_CAPSULE | Freq: Two times a day (BID) | ORAL | Status: DC
  Administered 2013-10-16 – 2013-10-20 (×9): 75 mg via ORAL
  Filled 2013-10-14 (×6): qty 1
  Filled 2013-10-14: qty 3
  Filled 2013-10-14 (×3): qty 1

## 2013-10-14 MED ORDER — HYDROMORPHONE HCL 2 MG/ML IJ SOLN
INTRAMUSCULAR | Status: AC
  Filled 2013-10-14: qty 1

## 2013-10-14 MED ORDER — ROCURONIUM BROMIDE 100 MG/10ML IV SOLN
INTRAVENOUS | Status: AC
  Filled 2013-10-14: qty 1

## 2013-10-14 MED ORDER — LABETALOL HCL 5 MG/ML IV SOLN
INTRAVENOUS | Status: DC | PRN
  Administered 2013-10-14: 5 mg via INTRAVENOUS

## 2013-10-14 MED ORDER — HYDROMORPHONE HCL 2 MG PO TABS
2.0000 mg | ORAL_TABLET | ORAL | Status: DC | PRN
  Administered 2013-10-17 – 2013-10-20 (×5): 2 mg via ORAL
  Filled 2013-10-14 (×5): qty 1

## 2013-10-14 MED ORDER — NEOSTIGMINE METHYLSULFATE 10 MG/10ML IV SOLN
INTRAVENOUS | Status: DC | PRN
  Administered 2013-10-14: 3 mg via INTRAVENOUS

## 2013-10-14 MED ORDER — GLYCOPYRROLATE 0.2 MG/ML IJ SOLN
INTRAMUSCULAR | Status: AC
  Filled 2013-10-14: qty 2

## 2013-10-14 MED ORDER — HYDROMORPHONE HCL 1 MG/ML IJ SOLN
0.5000 mg | INTRAMUSCULAR | Status: DC | PRN
  Administered 2013-10-15: 0.5 mg via INTRAVENOUS
  Administered 2013-10-15: 0.25 mg via INTRAVENOUS
  Administered 2013-10-16 (×4): 0.5 mg via INTRAVENOUS
  Administered 2013-10-17: 1 mg via INTRAVENOUS
  Administered 2013-10-17: 0.5 mg via INTRAVENOUS
  Administered 2013-10-17 – 2013-10-20 (×11): 1 mg via INTRAVENOUS
  Filled 2013-10-14 (×19): qty 1

## 2013-10-14 MED ORDER — PROMETHAZINE HCL 25 MG/ML IJ SOLN
6.2500 mg | INTRAMUSCULAR | Status: DC | PRN
  Administered 2013-10-14: 6.25 mg via INTRAVENOUS

## 2013-10-14 MED ORDER — LIDOCAINE HCL (CARDIAC) 20 MG/ML IV SOLN
INTRAVENOUS | Status: DC | PRN
  Administered 2013-10-14: 25 mg via INTRATRACHEAL
  Administered 2013-10-14: 75 mg via INTRAVENOUS

## 2013-10-14 MED ORDER — INFLUENZA VAC SPLIT QUAD 0.5 ML IM SUSY
0.5000 mL | PREFILLED_SYRINGE | INTRAMUSCULAR | Status: AC
  Administered 2013-10-17: 0.5 mL via INTRAMUSCULAR
  Filled 2013-10-14 (×3): qty 0.5

## 2013-10-14 MED ORDER — LACTATED RINGERS IR SOLN
Status: DC | PRN
  Administered 2013-10-14: 1000 mL

## 2013-10-14 MED ORDER — HYDROMORPHONE HCL 1 MG/ML IJ SOLN
INTRAMUSCULAR | Status: DC | PRN
  Administered 2013-10-14 (×2): 1 mg via INTRAVENOUS

## 2013-10-14 MED ORDER — MIDAZOLAM HCL 5 MG/5ML IJ SOLN
INTRAMUSCULAR | Status: DC | PRN
  Administered 2013-10-14 (×2): 1 mg via INTRAVENOUS

## 2013-10-14 MED ORDER — SUFENTANIL CITRATE 50 MCG/ML IV SOLN
INTRAVENOUS | Status: DC | PRN
  Administered 2013-10-14: 10 ug via INTRAVENOUS
  Administered 2013-10-14: 5 ug via INTRAVENOUS
  Administered 2013-10-14 (×3): 10 ug via INTRAVENOUS
  Administered 2013-10-14: 5 ug via INTRAVENOUS

## 2013-10-14 MED ORDER — SODIUM CHLORIDE 0.9 % IJ SOLN
INTRAMUSCULAR | Status: AC
  Filled 2013-10-14: qty 10

## 2013-10-14 SURGICAL SUPPLY — 52 items
CABLE HIGH FREQUENCY MONO STRZ (ELECTRODE) ×3 IMPLANT
CANNULA SEAL DVNC (CANNULA) ×1 IMPLANT
CANNULA SEALS DA VINCI (CANNULA) ×2
CHLORAPREP W/TINT 26ML (MISCELLANEOUS) ×3 IMPLANT
CLIP LIGATING HEM O LOK PURPLE (MISCELLANEOUS) ×6 IMPLANT
CLIP LIGATING HEMO LOK XL GOLD (MISCELLANEOUS) ×3 IMPLANT
CLIP LIGATING HEMO O LOK GREEN (MISCELLANEOUS) ×3 IMPLANT
CORDS BIPOLAR (ELECTRODE) ×3 IMPLANT
COVER SURGICAL LIGHT HANDLE (MISCELLANEOUS) ×3 IMPLANT
COVER TIP SHEARS 8 DVNC (MISCELLANEOUS) ×1 IMPLANT
COVER TIP SHEARS 8MM DA VINCI (MISCELLANEOUS) ×2
CUTTER ECHEON FLEX ENDO 45 340 (ENDOMECHANICALS) ×3 IMPLANT
DECANTER SPIKE VIAL GLASS SM (MISCELLANEOUS) ×3 IMPLANT
DERMABOND ADVANCED (GAUZE/BANDAGES/DRESSINGS) ×2
DERMABOND ADVANCED .7 DNX12 (GAUZE/BANDAGES/DRESSINGS) ×1 IMPLANT
DRAIN CHANNEL 15F RND FF 3/16 (WOUND CARE) IMPLANT
DRAPE INCISE IOBAN 66X45 STRL (DRAPES) ×3 IMPLANT
DRAPE LAPAROSCOPIC ABDOMINAL (DRAPES) ×3 IMPLANT
DRAPE SHEET LG 3/4 BI-LAMINATE (DRAPES) ×6 IMPLANT
DRAPE TABLE BACK 44X90 PK DISP (DRAPES) ×3 IMPLANT
DRAPE WARM FLUID 44X44 (DRAPE) ×3 IMPLANT
ELECT REM PT RETURN 9FT ADLT (ELECTROSURGICAL) ×3
ELECTRODE REM PT RTRN 9FT ADLT (ELECTROSURGICAL) ×1 IMPLANT
EVACUATOR SILICONE 100CC (DRAIN) IMPLANT
GLOVE BIOGEL M STRL SZ7.5 (GLOVE) ×6 IMPLANT
GOWN STRL REUS W/TWL LRG LVL3 (GOWN DISPOSABLE) ×3 IMPLANT
KIT ACCESSORY DA VINCI DISP (KITS) ×2
KIT ACCESSORY DVNC DISP (KITS) ×1 IMPLANT
KIT BASIN OR (CUSTOM PROCEDURE TRAY) ×3 IMPLANT
NEEDLE INSUFFLATION 14GA 120MM (NEEDLE) ×3 IMPLANT
PENCIL BUTTON HOLSTER BLD 10FT (ELECTRODE) ×3 IMPLANT
POSITIONER SURGICAL ARM (MISCELLANEOUS) ×6 IMPLANT
POUCH ENDO CATCH II 15MM (MISCELLANEOUS) IMPLANT
RELOAD WH ECHELON 45 (STAPLE) ×12 IMPLANT
SET TUBE IRRIG SUCTION NO TIP (IRRIGATION / IRRIGATOR) ×3 IMPLANT
SOLUTION ANTI FOG 6CC (MISCELLANEOUS) ×3 IMPLANT
SOLUTION ELECTROLUBE (MISCELLANEOUS) ×3 IMPLANT
SPONGE LAP 18X18 X RAY DECT (DISPOSABLE) ×3 IMPLANT
SPONGE LAP 4X18 X RAY DECT (DISPOSABLE) ×3 IMPLANT
SUT ETHILON 3 0 PS 1 (SUTURE) IMPLANT
SUT MNCRL AB 4-0 PS2 18 (SUTURE) ×3 IMPLANT
SUT PDS AB 1 CT1 27 (SUTURE) ×9 IMPLANT
SUT VICRYL 0 UR6 27IN ABS (SUTURE) IMPLANT
SYR BULB IRRIGATION 50ML (SYRINGE) IMPLANT
TOWEL OR NON WOVEN STRL DISP B (DISPOSABLE) ×3 IMPLANT
TRAY FOLEY CATH 14FRSI W/METER (CATHETERS) ×3 IMPLANT
TRAY LAP CHOLE (CUSTOM PROCEDURE TRAY) ×3 IMPLANT
TROCAR 12M 150ML BLUNT (TROCAR) ×3 IMPLANT
TROCAR BLADELESS OPT 5 75 (ENDOMECHANICALS) ×3 IMPLANT
TROCAR XCEL 12X100 BLDLESS (ENDOMECHANICALS) ×6 IMPLANT
TUBING INSUFFLATION 10FT LAP (TUBING) ×3 IMPLANT
WATER STERILE IRR 1500ML POUR (IV SOLUTION) ×6 IMPLANT

## 2013-10-14 NOTE — Progress Notes (Signed)
Dr. Delma Post in to see pt; OK to continue with labetalol as needed; Dr. Tresa Moore also in to check pt and aware of tachycardia, hypertension, nausea, and reduced urine output

## 2013-10-14 NOTE — Anesthesia Postprocedure Evaluation (Signed)
  Anesthesia Post-op Note  Patient: Cassandra Jordan  Procedure(s) Performed: Procedure(s) (LRB): ROBOTIC ASSISTED LAPAROSCOPIC RADICAL NEPHRECTOMY (Right)  Patient Location: PACU  Anesthesia Type: General  Level of Consciousness: awake and alert   Airway and Oxygen Therapy: Patient Spontanous Breathing  Post-op Pain: mild  Post-op Assessment: Post-op Vital signs reviewed, Patient's Cardiovascular Status Stable, Respiratory Function Stable, Patent Airway and No signs of Nausea or vomiting  Last Vitals:  Filed Vitals:   10/14/13 1815  BP: 124/59  Pulse: 35  Temp: 36.6 C  Resp: 15    Post-op Vital Signs: unstable   Complications: Arrhythmia. See separate note.

## 2013-10-14 NOTE — Progress Notes (Signed)
PACU note---heart rate still elevated, Dr. Delma Post in to check pt; preparing to do 12 lead EKG and heart rate down to 70's before EKG could be completed; EKG done and rate 70's; Dr. Delma Post spoke with Dr. Tresa Moore and pt will be observed overnight on telemetry status

## 2013-10-14 NOTE — Anesthesia Preprocedure Evaluation (Addendum)
Anesthesia Evaluation  Patient identified by MRN, date of birth, ID band Patient awake    Reviewed: Allergy & Precautions, H&P , NPO status , Patient's Chart, lab work & pertinent test results  Airway Mallampati: II TM Distance: >3 FB Neck ROM: Full    Dental   Very poor dentition. Many missing and broken teeth, broken at the gum line.:   Pulmonary asthma , Current Smoker,  breath sounds clear to auscultation  Pulmonary exam normal       Cardiovascular hypertension, Pt. on medications Rhythm:Regular Rate:Normal     Neuro/Psych negative neurological ROS  negative psych ROS   GI/Hepatic GERD-  ,(+) Hepatitis -, C  Endo/Other  negative endocrine ROSdiabetes, Type 1, Insulin Dependent  Renal/GU Renal disease  negative genitourinary   Musculoskeletal  (+) Arthritis -,   Abdominal (+) + obese,   Peds negative pediatric ROS (+)  Hematology negative hematology ROS (+)   Anesthesia Other Findings   Reproductive/Obstetrics negative OB ROS                      Anesthesia Physical Anesthesia Plan  ASA: III  Anesthesia Plan: General   Post-op Pain Management:    Induction: Intravenous  Airway Management Planned: Oral ETT  Additional Equipment:   Intra-op Plan:   Post-operative Plan: Extubation in OR  Informed Consent: I have reviewed the patients History and Physical, chart, labs and discussed the procedure including the risks, benefits and alternatives for the proposed anesthesia with the patient or authorized representative who has indicated his/her understanding and acceptance.   Dental advisory given  Plan Discussed with: CRNA  Anesthesia Plan Comments:         Anesthesia Quick Evaluation

## 2013-10-14 NOTE — Progress Notes (Signed)
Ms. Jaycox had an uneventful intraoperative course for her nephrectomy. She arrived to the PACU in a stable condition. Shortly, after arrival, she complained of nausea and her heart rate rose to 135. This was narrow complex, regular with no identifiable p waves. Labetalol 5 mg IV times 3 doses (total 15 mg IV) given over 25 minutes with seemingly no effect.  She then abruptly went into a regular normal sinus rhythm with rate at about 70 and she stayed in this normal rhythm for about 15 minutes and then she went into a different arrhythmia with a pulse of 35. A 12-lead ECG showed undetermined rhythm. Sinus rate of 35 followed by two premature supraventricular beats which are not perfusing. She remained asymptomatic through all these rhythms and her blood pressure remained 802-233 systolic. She has no complaints of chest pain, nor shortness of breath. She was given Atropine 0.5 mg IV times 2 doses over 25 minutes with no effect. At this time, I called a consult into cardiology and spoke with Mr. Rosalyn Gess, [place].Marland Kitchen She will be transferred to the stepdown unit and will be seen by cardiology.

## 2013-10-14 NOTE — Progress Notes (Signed)
PACU note---pt's heart rate 30's, frequent PVC's, Dr. Delma Post at bedside, EKG done, med given

## 2013-10-14 NOTE — Progress Notes (Signed)
PACU note---pt's heart rate and blood pressure elevated and she complains of nausea; nausea med given; Dr. Delma Post notified, order rec'd and med given

## 2013-10-14 NOTE — H&P (Signed)
Cassandra Jordan is an 63 y.o. female.    Chief Complaint: Pre-Op Right Radical Nephrectomy for Multifocal Right Renal Masses  HPI:   1 - Multifocal Right Solid Enhancing Renal Masses - Rt 2.4cm mid lateral 80% endophytic avidly enhancing solid mass as well as 1.5cm 100% endophytic solid mass incidental by CT 06/2013 on eval LLQ pain. Appears 1 artery / 1 vein right renovascular anatomy. No adrenal lesions or abdominal adenopathy. Recent Cr 0.56. Dedicated MRI acutally finds additional 1cm right lower pole enahancing mass, no worrisome contralateral lesions.   2 - Bilateral Renal Cysts - bilateral likley non-complex cysts also incidental by CT 06/2013. Left sided all <1cm and non-enhancing / complex. Rt lower 2cm with questionable enhancement (no non-con phase)  3 - Left Ovarian Mass - enlarged left ovary to 4+cm with assymetry by CT 06/2013. Post-menaupasal. h/o TAH for fibroids. CA 125 07/2013 normal, MRI with simple ovarian cysts only.   PMH sig for IDDM2 wtih LE neuropathy, TAH, obesity. No CV disease. No strong blood thinners (34m ASA for primary prevention).   Today Cassandra Jordan is seen to proceed with right robotic radical nephrectomy.   Past Medical History  Diagnosis Date  . Diabetes mellitus without complication   . Hypertension   . Asthma   . Neuropathic pain   . Hepatitis C   . Renal carcinoma   . Hyperlipidemia   . Arthritis   . Rash     ON FACE  . GERD (gastroesophageal reflux disease)   . Renal mass     MULTIPLE RT RENAL MASSES  . Nocturia     Past Surgical History  Procedure Laterality Date  . Abdominal hysterectomy    . Wart removal    . Colonoscopy N/A 07/08/2013    Procedure: COLONOSCOPY;  Surgeon: RDaneil Dolin MD;  [facility];  Service: Endoscopy;  Laterality: N/A;  7:30  . Esophagogastroduodenoscopy N/A 07/08/2013    Procedure: ESOPHAGOGASTRODUODENOSCOPY (EGD);  Surgeon: RDaneil Dolin MD;  [facility];  Service: Endoscopy;  Laterality:  N/A;  . Savory dilation N/A 07/08/2013    Procedure: SAVORY DILATION;  Surgeon: RDaneil Dolin MD;  [facility];  Service: Endoscopy;  Laterality: N/A;  .Venia Minksdilation N/A 07/08/2013    Procedure: MVenia MinksDILATION;  Surgeon: RDaneil Dolin MD;  [facility];  Service: Endoscopy;  Laterality: N/A;  . Tonsillectomy      Family History  Problem Relation Age of Onset  . Colon cancer Sister 617  Social History:  reports that she has been smoking.  She does not have any smokeless tobacco history on file. She reports that she drinks alcohol. She reports that she uses illicit drugs (Marijuana).  Allergies:  Allergies  Allergen Reactions  . Sulfa Antibiotics Itching and Other (See Comments)    burning    No prescriptions prior to admission    No results found for this or any previous visit (from the past 48 hour(s)). No results found.  Review of Systems  Constitutional: Negative.  Negative for fever and chills.  HENT: Negative.   Eyes: Negative.   Respiratory: Negative.   Cardiovascular: Negative.   Gastrointestinal: Negative.  Negative for nausea and vomiting.  Genitourinary: Negative for hematuria.  Musculoskeletal: Negative.   Skin: Negative.   Neurological: Negative.   Endo/Heme/Allergies: Negative.   Psychiatric/Behavioral: Negative.     There were no vitals taken for this visit. Physical Exam  Constitutional: She appears well-developed and  well-nourished.  HENT:  Head: Normocephalic and atraumatic.  Eyes: Pupils are equal, round, and reactive to light.  Neck: Normal range of motion.  Cardiovascular: Normal rate.   Respiratory: Effort normal.  GI: Soft.  Genitourinary:  No CVAT  Musculoskeletal: Normal range of motion.  Neurological: She is alert.  Skin: Skin is warm and dry.  Psychiatric: She has a normal mood and affect. Her behavior is normal. Judgment and thought content normal.     Assessment/Plan     1 - Multifocal Right Solid  Enhancing Renal Masses - worrisome for localized renal cell carcinoma. Given multifocality, good overall renal function, and very endophytic nature, she will likely be best served by right radical nephrectomy. MRI reassuring in terms of left side and left ovary.  We rediscussed the role of radical nephrectomy with the overall goal of complete surgical excision (negative margins) and better staging / diagnosis. We specificallyre discussed that with removal of the kidney there would be an overall renal function decline with attendant risks of renal failure and need for dialysis in some cases, and need for kidney-friendly lifestyle post-op with excellent blood pressure and glycemic control. We then rediscussed surgical approaches including robotic and open techniques with robotic associated with a shorter convalescence. I showed the patient on their abdomen the approximately 4-6 incision (trocar) sites as well as presumed extraction sites with robotic approach as well as possible open incision sites. We specifically rereaddressed that there may be need to alter operative plans according to intraopertive findings including conversion to open procedure. We discussed specific peri-operative risks including bleeding, infection, deep vein thrombosis, pulmonary embolism, compartment syndrome, nuropathy / neuropraxia, heart attack, stroke, death, as well as long-term risks such as non-cure / need for additional therapy and need for imaging and lab based post-op surveillance protocols. We rediscussed typical hospital course of approximately 2 day hospitalization, need for peri-operative drains / catheters, and typical post-hospital course with return to most non-strenuous activities by 2 weeks and ability to return to most jobs and more strenuous activity such as exercise by 6 weeks.   After this lengthy and detail discussion, including answering all of the patient's questions to their satisfaction, they have chosen to  proceed with right robotic radical nephrectomy today as planned.   2 - Bilateral Renal Cysts - all left sided lesions non-worrisome at this point and will be inherently surveilled going forward with ongoing imaging related to her right renal masses.  3 - Left Ovarian Mass - CA 125 normal and MRI reassuring, appreciate Dr. Serita Grit opinion, agree with surveillance at this point and no surgical resection at time of nephrectomy.  Chayanne Filippi 10/14/2013, 7:00 AM

## 2013-10-14 NOTE — Transfer of Care (Signed)
Immediate Anesthesia Transfer of Care Note  Patient: Cassandra Jordan  Procedure(s) Performed: Procedure(s) (LRB): ROBOTIC ASSISTED LAPAROSCOPIC RADICAL NEPHRECTOMY (Right)  Patient Location: PACU  Anesthesia Type: General  Level of Consciousness: sedated, patient cooperative and responds to stimulation  Airway & Oxygen Therapy: Patient Spontanous Breathing and Patient connected to face mask oxgen  Post-op Assessment: Report given to PACU RN and Post -op Vital signs reviewed and stable  Post vital signs: Reviewed and stable  Complications: No apparent anesthesia complications

## 2013-10-14 NOTE — Brief Op Note (Signed)
10/14/2013  4:06 PM  PATIENT:  Cassandra Jordan  63 y.o. female  PRE-OPERATIVE DIAGNOSIS:  MULTIPLE RIGHT RENAL MASSES  POST-OPERATIVE DIAGNOSIS:  MULTIPLE RIGHT RENAL MASSES  PROCEDURE:  Procedure(s): ROBOTIC ASSISTED LAPAROSCOPIC RADICAL NEPHRECTOMY (Right)  SURGEON:  Surgeon(s) and Role:    * [name], MD - Primary  PHYSICIAN ASSISTANT:   ASSISTANTS: Debbrah Alar, PA   ANESTHESIA:   local and general  EBL:  Total I/O In: 2000 [I.V.:2000] Out: 400 [Urine:300; Blood:100]  BLOOD ADMINISTERED:none  DRAINS: none   LOCAL MEDICATIONS USED:  MARCAINE     SPECIMEN:  Source of Specimen:  right radical nephrectomy  DISPOSITION OF SPECIMEN:  PATHOLOGY  COUNTS:  YES  TOURNIQUET:  * No tourniquets in log *  DICTATION: .Other Dictation: Dictation Number [id number]  PLAN OF CARE: Admit to inpatient   PATIENT DISPOSITION:  PACU - hemodynamically stable.   Delay start of Pharmacological VTE agent (>24hrs) due to surgical blood loss or risk of bleeding: yes

## 2013-10-14 NOTE — Progress Notes (Signed)
S: POD 0 s/p right radical nephrectomy. Had some arrythmia in PACU ranging from tachy to brady w/o pressure issues. Cardiology consulation recs monitoring and TTE in AM. NO chest pain or ST changes.  O: NAD post-op SBP upper 80s, no dizziness, HR 60s SNTND, surgical sites c/d/i Foley c/d/i with concentrated appearing urine  Post op Hgb 12  A/P - Agree with stepdown monitoring and TTE in AM. Appreciate cardiology input - Change to from 1/2 NS to NS at 125 and give albumin to help intravascular volume as some low UOP - AM Labs

## 2013-10-14 NOTE — Consult Note (Addendum)
Admit date: 10/14/2013 Referring Physician  Dr. Delma Post Primary Physician Colonel Bald, Chong Sicilian, NP Primary Cardiologist  None Reason for Consultation  irregular heart rhythm  HPI: 63 year-old female status post nephrectomy uneventful intraoperative course who postoperatively developed nausea with concomitant heart rate of 146, narrow complex that appears to be paroxysmal atrial tachycardia at 1516. According to anesthesia records, she was given 3 doses of labetalol totaling 15 mg over 25 minutes with no effect. She then abruptly went into regular sinus rhythm with a heart rate of 70 and continued with this rhythm for approximately 15 minutes and then developed what appeared to be frequent group beating PACs. These PACs were not perfusing hence palpable pulse was 35. She was asymptomatic. She was given atropine x2 doses with no effect.  Currently she is chest pain-free. She is still demonstrating frequent premature atrial contractions. There are no ST segment changes on EKG. She does have what appears to be small Q waves in aVF and 3, cannot exclude old inferior infarct pattern.  She has no prior cardiovascular history. She does have diabetes, hypertension, hepatitis C.  PMH:   Past Medical History  Diagnosis Date  . Diabetes mellitus without complication   . Hypertension   . Asthma   . Neuropathic pain   . Hepatitis C   . Renal carcinoma   . Hyperlipidemia   . Arthritis   . Rash     ON FACE  . GERD (gastroesophageal reflux disease)   . Renal mass     MULTIPLE RT RENAL MASSES  . Nocturia     PSH:   Past Surgical History  Procedure Laterality Date  . Abdominal hysterectomy    . Wart removal    . Colonoscopy N/A 07/08/2013    Procedure: COLONOSCOPY;  Surgeon: [name], MD;  [facility];  Service: Endoscopy;  Laterality: N/A;  7:30  . Esophagogastroduodenoscopy N/A 07/08/2013    Procedure: ESOPHAGOGASTRODUODENOSCOPY (EGD);  Surgeon: [name], MD;   [facility];  Service: Endoscopy;  Laterality: N/A;  . Savory dilation N/A 07/08/2013    Procedure: SAVORY DILATION;  Surgeon: [name], MD;  [facility];  Service: Endoscopy;  Laterality: N/A;  [name] dilation N/A 07/08/2013    Procedure: [name] DILATION;  Surgeon: [name], MD;  [facility];  Service: Endoscopy;  Laterality: N/A;  . Tonsillectomy     Allergies:  Sulfa antibiotics Prior to Admit Meds:   Prior to Admission medications   Medication Sig Start Date End Date Taking? Authorizing Provider  HYDROcodone-acetaminophen (NORCO) 10-325 MG per tablet Take 1 tablet by mouth every 6 (six) hours as needed.   Yes Historical Provider, MD  Ibuprofen-Diphenhydramine Cit (MOTRIN PM) 200-38 MG TABS Take 1 tablet by mouth at bedtime as needed (pain/sleep).   Yes Historical Provider, MD  insulin detemir (LEVEMIR) 100 UNIT/ML injection Inject 30 Units into the skin 2 (two) times daily.   Yes Historical Provider, MD  lisinopril-hydrochlorothiazide (PRINZIDE,ZESTORETIC) 20-12.5 MG per tablet Take 1 tablet by mouth every morning.   Yes Historical Provider, MD  potassium citrate (UROCIT-K) 10 MEQ (1080 MG) SR tablet Take 10 mEq by mouth every morning.   Yes Historical Provider, MD  pregabalin (LYRICA) 75 MG capsule Take 75 mg by mouth 2 (two) times daily.   Yes Historical Provider, MD   Fam HX:    Family History  Problem Relation Age of Onset  . Colon cancer Sister 45  Social HX:    History   Social History  . Marital Status: Single    Spouse Name: N/A    Number of Children: N/A  . Years of Education: N/A   Occupational History  . Not on file.   Social History Main Topics  . Smoking status: Current Every Day Smoker  . Smokeless tobacco: Not on file  . Alcohol Use: Yes     Comment: twice a week sometimes, drinks 2-3 40 oz.- STOPPED DRINKING 2-3 WKS AGO   . Drug Use: Yes    Special: Marijuana     Comment: OCCASIONAL - LAST USE 3 MONTHS  AGO  . Sexual Activity: Yes    Birth Control/ Protection: Post-menopausal, Surgical   Other Topics Concern  . Not on file   Social History Narrative  . No narrative on file     ROS:  Recent nausea post anesthesia area no syncope, no chest pain, no shortness of breath All 11 ROS were addressed and are negative except what is stated in the HPI   Physical Exam: Blood pressure 140/84, pulse 36, temperature 98 F (36.7 C), temperature source Oral, resp. rate 18, height 5' (1.524 m), weight 162 lb 2 oz (73.539 kg), SpO2 98.00%.   General: Still under mild sedation. Well developed, well nourished, in no acute distress Head: Eyes PERRLA, No xanthomas.   Normal cephalic and atramatic  Lungs:   Clear bilaterally to auscultation and percussion. Normal respiratory effort. No wheezes, no rales. Heart:   HRRR S1 S2 with frequent ectopy currently. Pulses are 2+ & equal. No murmur, rubs, gallops.  No carotid bruit. No JVD.  No abdominal bruits.  Abdomen: Bowel sounds are positive, abdomen soft and non-tender without masses. No hepatosplenomegaly. Msk: Nephrectomy scar Extremities:  No clubbing, cyanosis or edema.  DP +1 Neuro: Alert and oriented X 3, non-focal, MAE x 4 GU: Deferred Rectal: Deferred Psych:  Good affect, responds appropriately      Labs: Lab Results  Component Value Date   WBC 8.0 10/08/2013   HGB 12.2 10/14/2013   HCT 38.0 10/14/2013   MCV 88.8 10/08/2013   PLT 170 10/08/2013     Recent Labs Lab 10/08/13 1420  NA 137  K 5.5*  CL 100  CO2 28  BUN 9  CREATININE 0.60  CALCIUM 9.0  PROT 7.9  BILITOT 0.3  ALKPHOS 118*  ALT 112*  AST 135*  GLUCOSE 263*        Radiology:  No results found. Personally viewed.  EKG:  93bpm, NSR, with frequent premature atrial contractions, aberrant conduction, group beating. Q waves in 3 and aVF suggestive of old inferior infarct pattern. QT interval is 397. EKG previous to this shows sinus rhythm with small Q waves inferiorly,  poor R wave progression with corrected QT of 490 ms. Personally viewed.   ASSESSMENT/PLAN:    63 year old female with diabetes postop nephrectomy with arrhythmia, premature atrial contractions, transient bradycardia requiring atropine.  1. Arrhythmia-currently premature atrial contractions noted, there is a strip of paroxysmal atrial tachycardia on chart at heart rate of 146 beats per minute that was transient. She did have a group beating pattern of 3 consecutive beats, with what appears to be an aberrantly conducted [facility] in the middle which was leading to decreased palpable pulses. Her blood pressure remains normal in the 120-150 range and she does not have any symptoms. Currently I would continue to monitor her. If paroxysmal atrial tachycardia returns, consider IV Lopressor 5 mg. For now,  I will avoid given previous account of transient bradycardia.  She is not currently having any chest discomfort, shortness of breath. She appears comfortable. I will check echocardiogram which can be performed in the morning.   Make sure that potassium is greater than 4, magnesium greater than 2.   We will continue to follow.  Candee Furbish, MD  10/14/2013  6:44 PM

## 2013-10-14 NOTE — Discharge Instructions (Signed)

## 2013-10-15 LAB — HEMOGLOBIN AND HEMATOCRIT, BLOOD
HCT: 24.5 % — ABNORMAL LOW (ref 36.0–46.0)
HCT: 17 % — ABNORMAL LOW (ref 36.0–46.0)
HEMOGLOBIN: 5.7 g/dL — AB (ref 12.0–15.0)
Hemoglobin: 7.9 g/dL — ABNORMAL LOW (ref 12.0–15.0)

## 2013-10-15 LAB — BASIC METABOLIC PANEL
ANION GAP: 13 (ref 5–15)
ANION GAP: 12 (ref 5–15)
BUN: 15 mg/dL (ref 6–23)
BUN: 23 mg/dL (ref 6–23)
CHLORIDE: 101 meq/L (ref 96–112)
CO2: 21 mEq/L (ref 19–32)
CO2: 24 mEq/L (ref 19–32)
CREATININE: 1.12 mg/dL — AB (ref 0.50–1.10)
Calcium: 7.8 mg/dL — ABNORMAL LOW (ref 8.4–10.5)
Calcium: 7.7 mg/dL — ABNORMAL LOW (ref 8.4–10.5)
Chloride: 99 mEq/L (ref 96–112)
Creatinine, Ser: 1.26 mg/dL — ABNORMAL HIGH (ref 0.50–1.10)
GFR calc Af Amer: 52 mL/min — ABNORMAL LOW (ref >90)
GFR, EST AFRICAN AMERICAN: 60 mL/min — AB (ref >90)
GFR, EST NON AFRICAN AMERICAN: 52 mL/min — AB (ref >90)
GFR, EST NON AFRICAN AMERICAN: 45 mL/min — AB (ref >90)
Glucose, Bld: 239 mg/dL — ABNORMAL HIGH (ref 70–99)
Glucose, Bld: 204 mg/dL — ABNORMAL HIGH (ref 70–99)
POTASSIUM: 4.4 meq/L (ref 3.7–5.3)
Potassium: 5.7 mEq/L — ABNORMAL HIGH (ref 3.7–5.3)
SODIUM: 135 meq/L — AB (ref 137–147)
Sodium: 135 mEq/L — ABNORMAL LOW (ref 137–147)

## 2013-10-15 LAB — CBC WITH DIFFERENTIAL/PLATELET
Basophils Absolute: 0 10*3/uL (ref 0.0–0.1)
Basophils Relative: 0 % (ref 0–1)
EOS ABS: 0 10*3/uL (ref 0.0–0.7)
Eosinophils Relative: 0 % (ref 0–5)
HCT: 21.9 % — ABNORMAL LOW (ref 36.0–46.0)
Hemoglobin: 7.3 g/dL — ABNORMAL LOW (ref 12.0–15.0)
LYMPHS PCT: 36 % (ref 12–46)
Lymphs Abs: 8.1 10*3/uL — ABNORMAL HIGH (ref 0.7–4.0)
MCH: 28.2 pg (ref 26.0–34.0)
MCHC: 33.3 g/dL (ref 30.0–36.0)
MCV: 84.6 fL (ref 78.0–100.0)
MONO ABS: 2 10*3/uL — AB (ref 0.1–1.0)
Monocytes Relative: 9 % (ref 3–12)
Neutro Abs: 12.3 10*3/uL — ABNORMAL HIGH (ref 1.7–7.7)
Neutrophils Relative %: 55 % (ref 43–77)
PLATELETS: 109 10*3/uL — AB (ref 150–400)
RBC: 2.59 MIL/uL — ABNORMAL LOW (ref 3.87–5.11)
RDW: 15 % (ref 11.5–15.5)
WBC: 22.4 10*3/uL — ABNORMAL HIGH (ref 4.0–10.5)

## 2013-10-15 LAB — PHOSPHORUS: PHOSPHORUS: 3.2 mg/dL (ref 2.3–4.6)

## 2013-10-15 LAB — GLUCOSE, CAPILLARY
GLUCOSE-CAPILLARY: 210 mg/dL — AB (ref 70–99)
GLUCOSE-CAPILLARY: 193 mg/dL — AB (ref 70–99)
GLUCOSE-CAPILLARY: 169 mg/dL — AB (ref 70–99)
Glucose-Capillary: 135 mg/dL — ABNORMAL HIGH (ref 70–99)
Glucose-Capillary: 168 mg/dL — ABNORMAL HIGH (ref 70–99)
Glucose-Capillary: 196 mg/dL — ABNORMAL HIGH (ref 70–99)
Glucose-Capillary: 205 mg/dL — ABNORMAL HIGH (ref 70–99)

## 2013-10-15 LAB — CBC
HCT: 25 % — ABNORMAL LOW (ref 36.0–46.0)
HEMOGLOBIN: 8.6 g/dL — AB (ref 12.0–15.0)
MCH: 29.4 pg (ref 26.0–34.0)
MCHC: 34.4 g/dL (ref 30.0–36.0)
MCV: 85.3 fL (ref 78.0–100.0)
PLATELETS: 96 10*3/uL — AB (ref 150–400)
RBC: 2.93 MIL/uL — AB (ref 3.87–5.11)
RDW: 15.6 % — ABNORMAL HIGH (ref 11.5–15.5)
WBC: 24.3 10*3/uL — AB (ref 4.0–10.5)

## 2013-10-15 LAB — PREPARE RBC (CROSSMATCH)

## 2013-10-15 LAB — MAGNESIUM: Magnesium: 2.5 mg/dL (ref 1.5–2.5)

## 2013-10-15 MED ORDER — ADENOSINE 6 MG/2ML IV SOLN
INTRAVENOUS | Status: AC
  Filled 2013-10-15: qty 2

## 2013-10-15 MED ORDER — DILTIAZEM HCL 100 MG IV SOLR
5.0000 mg/h | INTRAVENOUS | Status: DC
  Filled 2013-10-15: qty 100

## 2013-10-15 MED ORDER — DILTIAZEM LOAD VIA INFUSION
15.0000 mg | Freq: Once | INTRAVENOUS | Status: DC
Start: 2013-10-15 — End: 2013-10-15
  Filled 2013-10-15: qty 15

## 2013-10-15 MED ORDER — METOPROLOL TARTRATE 1 MG/ML IV SOLN: 5.0000 mg | INTRAVENOUS | Status: DC | PRN

## 2013-10-15 MED ORDER — SODIUM CHLORIDE 0.9 % IV SOLN: Freq: Once | INTRAVENOUS | Status: DC

## 2013-10-15 MED ORDER — SODIUM CHLORIDE 0.9 % IV SOLN
INTRAVENOUS | Status: DC
  Administered 2013-10-15: 18:00:00 via INTRAVENOUS

## 2013-10-15 MED ORDER — METOPROLOL TARTRATE 1 MG/ML IV SOLN
5.0000 mg | Freq: Once | INTRAVENOUS | Status: AC
  Administered 2013-10-15: 5 mg via INTRAVENOUS

## 2013-10-15 MED ORDER — ADENOSINE 6 MG/2ML IV SOLN
6.0000 mg | Freq: Once | INTRAVENOUS | Status: AC
  Administered 2013-10-15: 6 mg via INTRAVENOUS

## 2013-10-15 MED ORDER — ACETAMINOPHEN 500 MG PO TABS: 1000.0000 mg | ORAL_TABLET | Freq: Three times a day (TID) | ORAL | Status: DC

## 2013-10-15 MED ORDER — METOPROLOL TARTRATE 1 MG/ML IV SOLN
INTRAVENOUS | Status: AC
  Filled 2013-10-15: qty 5

## 2013-10-15 MED ORDER — METOPROLOL TARTRATE 1 MG/ML IV SOLN
5.0000 mg | Freq: Four times a day (QID) | INTRAVENOUS | Status: DC
  Administered 2013-10-15 – 2013-10-17 (×6): 5 mg via INTRAVENOUS
  Filled 2013-10-15 (×6): qty 5

## 2013-10-15 MED ORDER — ALBUMIN HUMAN 5 % IV SOLN
12.5000 g | Freq: Once | INTRAVENOUS | Status: AC
  Administered 2013-10-15: 12.5 g via INTRAVENOUS
  Filled 2013-10-15: qty 250

## 2013-10-15 MED ORDER — SODIUM CHLORIDE 0.9 % IV SOLN
Freq: Once | INTRAVENOUS | Status: AC
  Administered 2013-10-15: 20:00:00 via INTRAVENOUS

## 2013-10-15 MED ORDER — SODIUM CHLORIDE 0.9 % IV SOLN
Freq: Once | INTRAVENOUS | Status: AC
  Administered 2013-10-15: 08:00:00 via INTRAVENOUS

## 2013-10-15 MED ORDER — SODIUM CHLORIDE 0.9 % IV SOLN
Freq: Once | INTRAVENOUS | Status: AC
  Administered 2013-10-15: 13:00:00 via INTRAVENOUS

## 2013-10-15 MED ORDER — SODIUM CHLORIDE 0.9 % IJ SOLN
10.0000 mL | Freq: Two times a day (BID) | INTRAMUSCULAR | Status: DC
  Administered 2013-10-15 – 2013-10-19 (×8): 10 mL

## 2013-10-15 MED ORDER — SODIUM CHLORIDE 0.9 % IJ SOLN: 10.0000 mL | INTRAMUSCULAR | Status: DC | PRN

## 2013-10-15 MED ORDER — ACETAMINOPHEN 10 MG/ML IV SOLN
1000.0000 mg | Freq: Three times a day (TID) | INTRAVENOUS | Status: AC
  Administered 2013-10-15 – 2013-10-16 (×3): 1000 mg via INTRAVENOUS
  Filled 2013-10-15 (×3): qty 100

## 2013-10-15 NOTE — Progress Notes (Signed)
Peripherally Inserted Central Catheter/Midline Placement  The IV Nurse has discussed with the patient and/or persons authorized to consent for the patient, the purpose of this procedure and the potential benefits and risks involved with this procedure.  The benefits include less needle sticks, lab draws from the catheter and patient may be discharged home with the catheter.  Risks include, but not limited to, infection, bleeding, blood clot (thrombus formation), and puncture of an artery; nerve damage and irregular heat beat.  Alternatives to this procedure were also discussed.  PICC/Midline Placement Documentation        Cassandra Jordan 10/15/2013, 2:40 PM

## 2013-10-15 NOTE — Progress Notes (Signed)
S: Pt receiving 1 of 2 units pRBC through PICC placed today as IV access difficult. This is in response to recheck Hgb 5's. SBP's trending up now 90s-110-s. Denies CP, SOB, Dizziness.  O: NAD C/o mild abd pain with change of position. PICC RUE c/d/i with blood transfusing No wound drainage or flank ecchymoses  A/P:  1 - Anemia - acute blood loss v. Dilutional / hard stick (required several attempts for most recent draw). Transfusing 2 units over 1 hr each with recheck 1 hr after second unit. NPO. Bedrest. Would consider re-exploration if does not respond to transfusion x several. Pt voiced understanding.  TTE today with excellent EF.

## 2013-10-15 NOTE — Progress Notes (Signed)
Put on 12lead EKG machine with rhythm strip continuously running while 66m of IV Adenocard was given via rapid bolus followed by 264mNS flush rapid bolus. ElEster Rinkon board during administration. Patient prepared  to feel some discomfort and dose well tolerated. NSR at 92 after brief brady rhythm. All strips on chart for this and multiple strips on chart showing rhythms preceding the episode. Patient was asymptomatic with tachycardia. Mildly confused as before, and denied SOB or palpitations. Valsalva manuever inefffective.

## 2013-10-15 NOTE — Progress Notes (Signed)
On call MD- Dr Glo Herring made aware of patient's continued  Hypotension and decreased UOP. Will given albumin 5% 12.5g. Pt completely A/O x4. NAD.  Current BP 60/41 (46) HR-90-SR with no ectopy.  Natale Milch, RN

## 2013-10-15 NOTE — Progress Notes (Signed)
S: Pt converted out of tacchyarrythmia again this PM, appreciate cards and critical care help. Modest low platelets noted, but concern for dysfunction given known h/o cirrosis/hepatitis.   O: HR 90s Hgb 7.1 (after 1u pRBC) Plts 109  A/P:  1 - Anemia - responding appropriately to transfusion. Proceed with transfusion second unit pRBC as well as 1 pack platelets as concerned for qualitative dysfunciton as well. Continue NPO, Bedrest, AM Labs.

## 2013-10-15 NOTE — Progress Notes (Signed)
[place] Progress Note Patient Name: Cassandra Jordan DOB: Dec 09, 1950 MRN: [id number]   Date of Service  10/15/2013  HPI/Events of Note  Failed IV met, await labs  eICU Interventions  Add cardizme drip protocol, bolus etc MAP 85     Intervention Category Major Interventions: Arrhythmia - evaluation and management  Raylene Miyamoto. 10/15/2013, 5:54 PM

## 2013-10-15 NOTE — Progress Notes (Signed)
    Patient Name: Cassandra Jordan Date of Encounter: 10/15/2013  Active Problems:   Renal mass   Length of Stay: 1  SUBJECTIVE  The patient denies any chest pain.  CURRENT MEDS . sodium chloride   Intravenous Once  . acetaminophen  1,000 mg Oral Q8H  . Influenza vac split quadrivalent PF  0.5 mL Intramuscular Tomorrow-1000  . insulin aspart  0-15 Units Subcutaneous TID WC  . insulin detemir  30 Units Subcutaneous BID  . pneumococcal 23 valent vaccine  0.5 mL Intramuscular Tomorrow-1000  . pregabalin  75 mg Oral BID    OBJECTIVE  Filed Vitals:   10/15/13 0600 10/15/13 0615 10/15/13 0630 10/15/13 0645  BP: 66/41 66/44 74/37  66/42  Pulse: 88 85 88 87  Temp:      TempSrc:      Resp: 20 14 14 17   Height:      Weight:      SpO2: 100% 100% 100% 100%    Intake/Output Summary (Last 24 hours) at 10/15/13 0832 Last data filed at 10/15/13 0641  Gross per 24 hour  Intake 4572.08 ml  Output    760 ml  Net 3812.08 ml   Filed Weights   10/14/13 1113 10/14/13 1847 10/15/13 0400  Weight: 162 lb 2 oz (73.539 kg) 168 lb 14 oz (76.6 kg) 171 lb 11.8 oz (77.9 kg)    PHYSICAL EXAM  General: Pleasant, NAD. Neuro: Alert and oriented X 3. Moves all extremities spontaneously. Psych: Normal affect. HEENT:  Normal  Neck: Supple without bruits or JVD. Lungs:  Resp regular and unlabored, CTA. Heart: RRR no s3, s4, or murmurs. Abdomen: Soft,tender, non-distended, BS + x 4. Surgical scar in the RLQ Extremities: No clubbing, cyanosis or edema. DP/PT/Radials 2+ and equal bilaterally.  Accessory Clinical Findings  CBC  Recent Labs  10/14/13 1607 10/15/13 0600  HGB 12.2 7.9*  HCT 38.0 88.3*   Basic Metabolic Panel  Recent Labs  10/14/13 2000 10/15/13 0600  NA 132* 135*  K 5.7* 5.7*  CL 99 101  CO2 21 21  GLUCOSE 161* 239*  BUN 9 15  CREATININE 0.80 1.12*  CALCIUM 8.3* 7.8*  MG 1.7  --    Radiology/Studies  Dg Chest 2 View  10/08/2013   CLINICAL DATA:  Preop for  right nephrectomy, long-term smoking history, diabetes  EXAM: CHEST  2 VIEW  COMPARISON:  Chest x-ray of 04/10/2012  FINDINGS: No active infiltrate or effusion is seen. Mediastinal and hilar contours are unchanged. The heart is within normal limits in size. No bony abnormality is seen.  IMPRESSION: No active cardiopulmonary disease.   Electronically Signed   By: Ivar Drape M.D.   On: 10/08/2013 16:11    TELE: SR 70-90 BPM     ASSESSMENT AND PLAN  63 year old female with diabetes postop nephrectomy with arrhythmia, premature atrial contractions, transient bradycardia requiring atropine.   1. Arrhythmia- resolved atrial tachycardia and PACs. Currently in normal sinus rhythm. Echo is being done bedside right now and preliminary her LVEF is 60-65% with no regional wall motion abnormalities.   2. Hyperkalemia - post nephrectomy, control by primary team, no ECG changes  3. Significant hypotension and anemia - PRBC transfusion should be considered   Signed, Dorothy Spark MD, [facility] 10/15/2013

## 2013-10-15 NOTE — Progress Notes (Signed)
CARE MANAGEMENT NOTE 10/15/2013  Patient:  Cassandra Jordan, Cassandra Jordan   Account Number:  [id number]  Date Initiated:  10/15/2013  Documentation initiated by:  DAVIS,RHONDA  Subjective/Objective Assessment:   Multifocal Right Solid Enhancing Renal Masses/Multifocal Right Solid Enhancing Renal Masses/ Left Ovarian Mass/     Action/Plan:   from home with wife/Past Medical History:  .  Diabetes mellitus w/o complication  .  Hypertension  .  Asthma  .  Neuropathic pain  .  Hepatitis C  .  Renal carcinoma  .  Hyperlipidemia  . GERD   Anticipated DC Date:  10/18/2013   Anticipated DC Plan:  HOME/SELF CARE  In-house referral  NA      DC Planning Services  NA      PAC Choice  NA   Choice offered to / List presented to:  NA   DME arranged  NA      DME agency  NA     [place] arranged  NA      [place] agency  NA   Status of service:  In process, will continue to follow Medicare Important Message given?  NA - LOS <3 / Initial given by admissions (If response is "NO", the following Medicare IM given date fields will be blank) Date Medicare IM given:   Medicare IM given by:   Date Additional Medicare IM given:   Additional Medicare IM given by:    Discharge Disposition:    Per UR Regulation:  Reviewed for med. necessity/level of care/duration of stay  If discussed at [place] of Stay Meetings, dates discussed:    Comments:  [id number]/Rhonda L. Rosana Hoes, RN, BSN, CCM: Discharge needs reviewed at time of this chart review.

## 2013-10-15 NOTE — Op Note (Signed)
[name], [name]              ACCOUNT NO.:  [id number]  MEDICAL RECORD NO.:  [id number]  LOCATION:  4782                         FACILITY:  [facility]  PHYSICIAN:  [name], MD     DATE OF BIRTH:  Nov 27, 1950  DATE OF PROCEDURE:  10/14/2013 DATE OF DISCHARGE:                              OPERATIVE REPORT   DIAGNOSIS:  Multifocal right renal masses.  PROCEDURE:  Robotic-assisted laparoscopic right radical nephrectomy.  ASSISTANT:  Debbrah Alar, PA-C.  ESTIMATED BLOOD LOSS:  100 mL.  SPECIMEN:  Right radical nephrectomy.  DRAINS:  Foley catheter to straight drain.  INDICATION:  Cassandra Jordan is a pleasant 62 year old lady who was found to have multifocal right renal masses by axial imaging with 2 very endophytic enhancing renal masses by CT.  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  Her left kidney was unremarkable.  She has normal renal function and no evidence of metastatic disease.  Options were discussed with her including surveillance versus multifocal ablative therapy versus right radical nephrectomy, and she wished to proceed with latter.  Informed consent was obtained and placed in medical record.  PROCEDURE IN DETAIL:  The patient being Cassandra Jordan, was verified. Procedure being right robotic radical nephrectomy was confirmed. Procedure was carried out.  Time-out was performed.  Intravenous antibiotics were administered.  General endotracheal anesthesia was introduced.  The patient was placed into a right-sided up full flank position applying at 15 degrees stable flexion in the superior arm elevator, sequential compression devices and bean bag.  The bottom leg was bent and top leg was straight.  She was further fashioned on the operative table using 3-inch tape over foam padding.  Sterile field was created by prepping and draping the patient's right  abdomen and flank using chlorhexidine gluconate.  Next, a high-flow low pressure pneumoperitoneum was obtained using Veress technique in the right lower quadrant having passed the aspiration and drop test.  Next, a 12-mm robotic camera port was placed in position approximately 3.5 fingerbreadths superolateral to the umbilicus.  Laparoscopic examination of  peritoneal cavity revealed some loose adhesions and then the area of the cecum to the abdominal wall had no visceral injury and the Veress needle was visualized.  Additional ports were then placed as follows; right subxiphoid 5 mm assistant port through which a self-retaining grasper was used to elevate the liver edge off the superior aspect of the kidney; an 8-mm robotic port, approximately 1 fingerbreadth below the costal margin and 8-mm robotic port in the right paramedian location approximately handbreadth superior to the pubic ramus and then 8 mm robotic port far lateral approximately 1 handbreadth superior to the anterior iliac spine.  Two assistant ports were placed in the midline at 12 mm in diameter, 1 approximately 3 fingerbreadths above the camera port and another 3 fingerbreadths below.  Robot was docked and passed through electronic checks.  Attention was directed at the retroperitoneum.  The aforementioned loose adhesions to the cecal area were carefully taken down using cold robotic scissors technique and then dropped medially.  Incision was then made lateral to the ascending colon  from the area of the cecum towards the area of the hepatic flexure. This was carefully mobilized medially.  The lower pole of the kidney was identified, placed on gentle lateral traction.  Dissection proceeded medial to this.  The ureter and gonadal were encountered and also placed on gentle lateral traction.  The duodenum was encountered and Kocherized carefully medially.  The ureter was controlled using cold clips, 1 each side and the gonadal  using 2 clips each side.  Dissection proceeded within this triangle superiorly towards the renal hilum.  The renal hilum consisted of early branching artery, single vein, right renovascular anatomy.  This was controlled using sequential stapling technique, first of the abdominal lower artery and then of the vein, and then of the smaller upper artery. This resulted in excellent hemostatic control of the hilum.  The adrenal gland was carefully swept away from the lateral aspect of the inferior vena cava and a vascular stapler was placed in this location separating these 2 structures hemostatically. The superior attachments were taken down as were lateral attachments. This completely freed up the radical nephrectomy.  The specimen was placed into extra large EndoCatch bag.  The robot was then undocked and the area of the hilum was inspected carefully and found to be completely hemostatic.  All sponge and needle counts were correct.  The specimen was retrieved by rather connecting the 2 midline assistant port sites and removing the nephrectomy specimen in its entirety and setting aside for permanent Pathology.  The previous 12-mm robotic camera port site was closed with fascia using 2-0 Vicryl and the extraction site was closed at the level of the fascia using figure-of-eight PDS x6 followed by running Vicryl at the level of Scarpa's.  All incisions were infiltrated with dilute lyophilized Marcaine and closed level of skin using subcuticular Monocryl followed by Dermabond.  Procedure was then terminated.  The patient tolerated the procedure well.  There were no immediate periprocedural complications.  The patient was taken to postanesthesia care unit in stable condition.          ______________________________ [name], MD     TM/MEDQ  D:  10/14/2013  T:  10/15/2013  Job:  [id number]

## 2013-10-15 NOTE — Progress Notes (Signed)
  Echocardiogram 2D Echocardiogram has been performed.  Cassandra Jordan M 10/15/2013, 8:54 AM

## 2013-10-15 NOTE — Progress Notes (Signed)
eLink Physician-Brief Progress Note Patient Name: Cassandra Jordan DOB: 1950/02/10 MRN: [id number]   Date of Service  10/15/2013  HPI/Events of Note  Svt again, BP wnl Had k 5.7 and she is getting blood now for hct 17  eICU Interventions  STAT bmet, mg, phos, cbc Give blood faster Metoprolol IV now     Intervention Category Major Interventions: Arrhythmia - evaluation and management  FEINSTEIN,DANIEL J. 10/15/2013, 5:20 PM

## 2013-10-15 NOTE — Progress Notes (Signed)
1 Day Post-Op  Subjective:  1 - Multifocal Right Renal Masses - s/p robotic radical nephrectomy 9/23, the day of admission, for multifocal enhancing right renal masses, path pending.  2 - Hemodynamics / Arrythmia - Pt with narrow complex tachy arrythmia in PACU post-op which converted to bradyarrythmia, all asymptomatic. Cardiology consultation obtained, placed on tele, TTE pending. Hgb POD1 7.9 (nearly 4L positive fluid)  3 - Solitary Kidney / Hyperkalemia - mild hyperkalemia w/o EKG changes on post-op labs. Holding K-contianing products. On insulin.   Today Jamonica c/o abd soreness. No focal symptoms. No CP/SOB.  Objective: Vital signs in last 24 hours: Temp:  [97.7 F (36.5 C)-98.2 F (36.8 C)] 97.9 F (36.6 C) (09/24 0400) Pulse Rate:  [34-134] 87 (09/24 0645) Resp:  [8-32] 17 (09/24 0645) BP: (53-169)/(22-112) 66/42 mmHg (09/24 0645) SpO2:  [94 %-100 %] 100 % (09/24 0645) Weight:  [73.539 kg (162 lb 2 oz)-77.9 kg (171 lb 11.8 oz)] 77.9 kg (171 lb 11.8 oz) (09/24 0400)    Intake/Output from previous day: 09/23 0701 - 09/24 0700 In: 4572.1 [P.O.:120; I.V.:3902.1; IV Piggyback:550] Out: 760 [Urine:660; Blood:100] Intake/Output this shift:    General appearance: alert, cooperative and appears older than stated age Head: Normocephalic, without obvious abnormality, atraumatic Throat: lips, mucosa, and tongue normal; teeth and gums normal Neck: supple, symmetrical, trachea midline Back: symmetric, no curvature. ROM normal. No CVA tenderness. Resp: non-labored on [place] O2. No wheezes. Chest wall: no tenderness Cardio: Nl rate and sinus by tele GI: soft, non-tender; bowel sounds normal; no masses,  no organomegaly Pelvic: external genitalia normal and foley c/d/i with concentrated appearing urine.  Extremities: extremities normal, atraumatic, no cyanosis or edema Pulses: 2+ and symmetric Skin: Skin color, texture, turgor normal. No rashes or lesions Lymph nodes: Cervical,  supraclavicular, and axillary nodes normal. Neurologic: Grossly normal Incision/Wound: Recent port sites / extraction sites c/d/i. No drainage.   Lab Results:   Recent Labs  10/14/13 1607 10/15/13 0600  HGB 12.2 7.9*  HCT 38.0 24.5*   BMET  Recent Labs  10/14/13 2000 10/15/13 0600  NA 132* 135*  K 5.7* 5.7*  CL 99 101  CO2 21 21  GLUCOSE 161* 239*  BUN 9 15  CREATININE 0.80 1.12*  CALCIUM 8.3* 7.8*   PT/INR No results found for this basename: LABPROT, INR,  in the last 72 hours ABG No results found for this basename: PHART, PCO2, PO2, HCO3,  in the last 72 hours  Studies/Results: No results found.  Anti-infectives: Anti-infectives   Start     Dose/Rate Route Frequency Ordered Stop   10/14/13 1042  ceFAZolin (ANCEF) IVPB 2 g/50 mL premix     2 g 100 mL/hr over 30 Minutes Intravenous 30 min pre-op 10/14/13 1042 10/14/13 1339      Assessment/Plan:  1 - Multifocal Right Renal Masses - path pending.   2 - Hemodynamics / Arrythmia - appreciate Dr. Marlou Porch / Cardiology input on arythmia. Echo penidng. Some low BP but assymptomatic and w/o tachycardia, will recheck Hgb later this AM and consider transfusion if trending down. No great concern for acute post-op bleed given very favorable intraoperative course, suspect dilutional as nearly 3.5L positive.   3 - Solitary Kidney / Hyperkalemia - K stable, continue insulin schedule + SSI should keep in safe range. UOP low but acceptable.  4 - Remain ICU today.   [facility], Jaydn Moscato 10/15/2013

## 2013-10-15 NOTE — Progress Notes (Signed)
Received a call from RN that the patient had suddenly gone into a tachycardia, possibly SVT, rate 160. Currently mentating well and blood pressure is stable.  Initially started metoprolol IV 5 mg when necessary. ECG was obtained and reviewed by Dr. Sallyanne Kuster. Her heart rate slowed slightly with the IV metoprolol and ECG appears to be atrial tachycardia. Adenosine 6 mg was ordered and given as a one-time dose.   With this, the patient converted to sinus rhythm. She is currently in sinus rhythm in the 22M with a systolic blood pressure of 116. As she is n.p.o., will add scheduled IV Lopressor every 6 hours. The diltiazem IV was ordered but never started so we'll discontinue this.   12-lead rhythm strip during the adenosine administration and ECG are in the paper chart for review.  >>>>>>>>>>>>>  Electrocardiographic tracings reviewed. The arrhythmia began spontaneously without a long short sequence, more suggestive of ectopic atrial tachycardia. At arrhythmia onset there are couple of wide complex beats, likely due to aberrant conduction. The fact that the arrhythmia terminated with intravenous adenosine suggests that this may have been AV node reentry tachycardia, although some atrial tachycardia as also are a denizen-responsive. Agree with scheduled beta blockers.  Sanda Klein, MD, [facility] [phone] office [phone] pager

## 2013-10-16 ENCOUNTER — Encounter (HOSPITAL_COMMUNITY): Payer: Self-pay | Admitting: Urology

## 2013-10-16 DIAGNOSIS — I10 Essential (primary) hypertension: Secondary | ICD-10-CM

## 2013-10-16 LAB — BASIC METABOLIC PANEL
ANION GAP: 9 (ref 5–15)
BUN: 20 mg/dL (ref 6–23)
CALCIUM: 7.7 mg/dL — AB (ref 8.4–10.5)
CHLORIDE: 103 meq/L (ref 96–112)
CO2: 25 mEq/L (ref 19–32)
Creatinine, Ser: 1.14 mg/dL — ABNORMAL HIGH (ref 0.50–1.10)
GFR calc Af Amer: 58 mL/min — ABNORMAL LOW (ref >90)
GFR calc non Af Amer: 50 mL/min — ABNORMAL LOW (ref >90)
GLUCOSE: 115 mg/dL — AB (ref 70–99)
POTASSIUM: 3.9 meq/L (ref 3.7–5.3)
SODIUM: 137 meq/L (ref 137–147)

## 2013-10-16 LAB — GLUCOSE, CAPILLARY
GLUCOSE-CAPILLARY: 111 mg/dL — AB (ref 70–99)
GLUCOSE-CAPILLARY: 120 mg/dL — AB (ref 70–99)
GLUCOSE-CAPILLARY: 128 mg/dL — AB (ref 70–99)
Glucose-Capillary: 137 mg/dL — ABNORMAL HIGH (ref 70–99)
Glucose-Capillary: 134 mg/dL — ABNORMAL HIGH (ref 70–99)

## 2013-10-16 LAB — PREPARE PLATELET PHERESIS: Unit division: 0

## 2013-10-16 LAB — CBC
HCT: 24.1 % — ABNORMAL LOW (ref 36.0–46.0)
Hemoglobin: 8.2 g/dL — ABNORMAL LOW (ref 12.0–15.0)
MCH: 29.1 pg (ref 26.0–34.0)
MCHC: 34 g/dL (ref 30.0–36.0)
MCV: 85.5 fL (ref 78.0–100.0)
PLATELETS: 158 10*3/uL (ref 150–400)
RBC: 2.82 MIL/uL — AB (ref 3.87–5.11)
RDW: 16 % — ABNORMAL HIGH (ref 11.5–15.5)
WBC: 21.4 10*3/uL — AB (ref 4.0–10.5)

## 2013-10-16 LAB — HEMOGLOBIN AND HEMATOCRIT, BLOOD
HCT: 33.6 % — ABNORMAL LOW (ref 36.0–46.0)
Hemoglobin: 11.2 g/dL — ABNORMAL LOW (ref 12.0–15.0)

## 2013-10-16 LAB — MAGNESIUM: MAGNESIUM: 2.3 mg/dL (ref 1.5–2.5)

## 2013-10-16 LAB — PREPARE RBC (CROSSMATCH)

## 2013-10-16 MED ORDER — SODIUM CHLORIDE 0.9 % IV SOLN
Freq: Once | INTRAVENOUS | Status: AC
  Administered 2013-10-16: 07:00:00 via INTRAVENOUS

## 2013-10-16 MED ORDER — ADENOSINE 6 MG/2ML IV SOLN
6.0000 mg | Freq: Once | INTRAVENOUS | Status: AC
  Administered 2013-10-16: 6 mg via INTRAVENOUS
  Filled 2013-10-16: qty 2

## 2013-10-16 NOTE — Progress Notes (Signed)
eLink Physician-Brief Progress Note Patient Name: Cassandra Jordan DOB: 05-06-1950 MRN: [id number]   Date of Service  10/16/2013  HPI/Events of Note  D/e cards, SVt re entry  eICU Interventions  Observed entire procedure addenosine. Tolerated well Continue to monitor     Intervention Category Major Interventions: Arrhythmia - evaluation and management  Maddox Bratcher J. 10/16/2013, 5:32 PM

## 2013-10-16 NOTE — Progress Notes (Addendum)
Patient Name: Cassandra Jordan Date of Encounter: 10/16/2013  Active Problems:   Renal mass   Length of Stay: 2  SUBJECTIVE  No arrhythmias overnight.   CURRENT MEDS . sodium chloride   Intravenous Once  . acetaminophen  1,000 mg Intravenous 3 times per day  . Influenza vac split quadrivalent PF  0.5 mL Intramuscular Tomorrow-1000  . insulin aspart  0-15 Units Subcutaneous TID WC  . insulin detemir  30 Units Subcutaneous BID  . metoprolol  5 mg Intravenous 4 times per day  . pneumococcal 23 valent vaccine  0.5 mL Intramuscular Tomorrow-1000  . pregabalin  75 mg Oral BID  . sodium chloride  10-40 mL Intracatheter Q12H    OBJECTIVE  Filed Vitals:   10/16/13 0800 10/16/13 0900 10/16/13 0945 10/16/13 1000  BP: 104/46 158/84 140/74 146/80  Pulse:   85 85  Temp: 98 F (36.7 C)  98.4 F (36.9 C) 99 F (37.2 C)  TempSrc: Oral  Oral Oral  Resp: 15 10 21 19   Height:      Weight:      SpO2: 99% 99% 96% 98%    Intake/Output Summary (Last 24 hours) at 10/16/13 1117 Last data filed at 10/16/13 0945  Gross per 24 hour  Intake 3652.5 ml  Output   1400 ml  Net 2252.5 ml   Filed Weights   10/14/13 1113 10/14/13 1847 10/15/13 0400  Weight: 162 lb 2 oz (73.539 kg) 168 lb 14 oz (76.6 kg) 171 lb 11.8 oz (77.9 kg)    PHYSICAL EXAM  General: Pleasant, NAD. Neuro: Alert and oriented X 3. Moves all extremities spontaneously. Psych: sleepy. HEENT:  Normal  Neck: Supple without bruits or JVD. Lungs:  Resp regular and unlabored, CTA. Heart: RRR no s3, s4, or murmurs. Abdomen: Soft,tender, non-distended, BS + x 4. Surgical scar in the RLQ Extremities: No clubbing, cyanosis or edema. DP/PT/Radials 2+ and equal bilaterally.  Accessory Clinical Findings  CBC  Recent Labs  10/15/13 1740 10/15/13 2210 10/16/13 0430  WBC 22.4* 24.3* 21.4*  NEUTROABS 12.3*  --   --   HGB 7.3* 8.6* 8.2*  HCT 21.9* 25.0* 24.1*  MCV 84.6 85.3 85.5  PLT 109* 96* 413   Basic Metabolic  Panel  Recent Labs  10/15/13 0600 10/15/13 1740 10/16/13 0430  NA 135* 135* 137  K 5.7* 4.4 3.9  CL 101 99 103  CO2 21 24 25   GLUCOSE 239* 204* 115*  BUN 15 23 20   CREATININE 1.12* 1.26* 1.14*  CALCIUM 7.8* 7.7* 7.7*  MG  --  2.5 2.3  PHOS  --  3.2  --    Radiology/Studies  Dg Chest 2 View  10/08/2013   CLINICAL DATA:  Preop for right nephrectomy, long-term smoking history, diabetes  EXAM: CHEST  2 VIEW  COMPARISON:  Chest x-ray of 04/10/2012  FINDINGS: No active infiltrate or effusion is seen. Mediastinal and hilar contours are unchanged. The heart is within normal limits in size. No bony abnormality is seen.  IMPRESSION: No active cardiopulmonary disease.   Electronically Signed   By: Ivar Drape M.D.   On: 10/08/2013 16:11    TELE: SR 70-90 BPM     ASSESSMENT AND PLAN  63 year old female with diabetes postop nephrectomy with arrhythmia, premature atrial contractions, transient bradycardia requiring atropine.   1. 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 Overnight only PACs. We will increase Metoprolol to 7.5 mg  iv Q6H, and switch to PO once able to eat  resolved atrial tachycardia and PACs. Currently in normal sinus rhythm. Echo is being done bedside right now and preliminary her LVEF is 60-65% with no regional wall motion abnormalities.   2. Hyperkalemia - post nephrectomy, resolved  3. Significant hypotension and anemia - hypotension resolved, PRBC transfusion for anemia  4. Hypertension - 7.5 mg iv Metoprolol Q6H   Signed, Dorothy Spark MD, [facility] 10/16/2013

## 2013-10-16 NOTE — Progress Notes (Signed)
Paged MD Croitoru about patient's HR in 150s. MD Croitoru advised to give patient 62m IV push of Adenosine. He stated patient did the same thing yesterday and she tolerated the 6 mg adenosine well. MD Manny also was paged and called back. MD MTresa Moorewas updated and agreed to adenosine as cardiology ordered. I asked Manny if he wanted to stop the infusion or continue. Manny advised since she did the same thing yesterday, to continue infusion, does not think it is a reaction, and to get an H&H an hour after infusion stops. Charge RN DLangley Gausscontacted EBurlington Northern [place]and MD FTitus Mouldagreed to observe administration of adenosine via the camera and would observe from box. MD FTitus Mouldspoke with patient and stayed involved until after push and new vitals performed.   Crash cart at bedside and patient hooked up to 12-lead EKG. 12-lead recording in chart. CAstronomerat bedside. I pushed Adenosine and Denise followed behind with saline flush. Patient went asystole and converted back to normal sinus in the 70's. Patient now resting, vital signs stable. Will continue to monitor.

## 2013-10-16 NOTE — Progress Notes (Signed)
2 Days Post-Op  Subjective:  1 - Multifocal Right Renal Masses - s/p robotic radical nephrectomy 9/23, the day of admission, for multifocal enhancing right renal masses, path pending.  2 - Hemodynamics / Arrythmia - Pt with narrow complex tachy arrythmia in PACU post-op which converted to bradyarrythmia, all asymptomatic. Cardiology consultation obtained, placed on tele, TTE pending. Hgb POD1 drop to 5.7 --> 8.1 after 2upRBC and 1 u platelets and PICC placed.   3 - Solitary Kidney / Hyperkalemia - mild hyperkalemia w/o EKG changes on post-op labs. Holding K-contianing products. On insulin, not required specific therapy thus far.  Today Cassandra Jordan is feeling much stronger. BP, UOP much better after transfusions, also less rhythm problems overnight.   Objective: Vital signs in last 24 hours: Temp:  [97.7 F (36.5 C)-98.7 F (37.1 C)] 98.7 F (37.1 C) (09/25 0400) Pulse Rate:  [87-160] 88 (09/24 2000) Resp:  [15-33] 20 (09/25 0621) BP: (68-160)/(28-102) 160/91 mmHg (09/25 0621) SpO2:  [85 %-100 %] 97 % (09/25 0621)    Intake/Output from previous day: 09/24 0701 - 09/25 0700 In: 4047.5 [P.O.:200; I.V.:2125; Blood:1272.5; IV Piggyback:200] Out: 1400 [Urine:1400] Intake/Output this shift:    General appearance: alert, cooperative and appears stated age Head: Normocephalic, without obvious abnormality, atraumatic Neck: supple, symmetrical, trachea midline Back: symmetric, no curvature. ROM normal. No CVA tenderness. Resp: non-labored on [place] O2 Cardio: Nl rate and sinus by bedsdie monitor.  GI: soft, non-tender; bowel sounds normal; no masses,  no organomegaly Pelvic: external genitalia normal and foley c/d/i with less-concentrated appearing urine.  Extremities: extremities normal, atraumatic, no cyanosis or edema Pulses: 2+ and symmetric Skin: Skin color, texture, turgor normal. No rashes or lesions Lymph nodes: Cervical, supraclavicular, and axillary nodes normal. Neurologic: Grossly  normal Incision/Wound: Recent port sites and extraction sites c/d/i. No large ecchymoses / flank hematomas.   Lab Results:   Recent Labs  10/15/13 2210 10/16/13 0430  WBC 24.3* 21.4*  HGB 8.6* 8.2*  HCT 25.0* 24.1*  PLT 96* 158   BMET  Recent Labs  10/15/13 1740 10/16/13 0430  NA 135* 137  K 4.4 3.9  CL 99 103  CO2 24 25  GLUCOSE 204* 115*  BUN 23 20  CREATININE 1.26* 1.14*  CALCIUM 7.7* 7.7*   PT/INR No results found for this basename: LABPROT, INR,  in the last 72 hours ABG No results found for this basename: PHART, PCO2, PO2, HCO3,  in the last 72 hours  Studies/Results: No results found.  Anti-infectives: Anti-infectives   Start     Dose/Rate Route Frequency Ordered Stop   10/14/13 1042  ceFAZolin (ANCEF) IVPB 2 g/50 mL premix     2 g 100 mL/hr over 30 Minutes Intravenous 30 min pre-op 10/14/13 1042 10/14/13 1339      Assessment/Plan:  1 - Multifocal Right Renal Masses - path pending.   2 - Hemodynamics / Arrythmia - appreciate Dr. Marlou Porch / Cardiology input on arythmia. Echo penidng. Suspect some underlying conduction issues exacerbated by symptomatic anemia. Will transfuse 2 additional units pRBC today to proved further reserve as she is still with Hgb <9. Etiology of anemia likely some post-op bleed (appers stabilized with tranfusion only) + some dilutional.   3 - Solitary Kidney / Hyperkalemia - K stable, continue insulin schedule + SSI should keep in safe range. UOP improved and Cr acceptable.  4 - Remain ICU today, Advance to diabetic diet. Ambulate.   [facility], Hyland Mollenkopf 10/16/2013

## 2013-10-16 NOTE — Progress Notes (Signed)
Patient heart rate jumped into the 140-150's. Paged MD Manny. Patient asymptomatic and sleeping. Awoke patient and has no c/o chest pain, no SHOB, no dizziness.

## 2013-10-17 DIAGNOSIS — R7989 Other specified abnormal findings of blood chemistry: Secondary | ICD-10-CM

## 2013-10-17 LAB — GLUCOSE, CAPILLARY
GLUCOSE-CAPILLARY: 52 mg/dL — AB (ref 70–99)
GLUCOSE-CAPILLARY: 94 mg/dL (ref 70–99)
Glucose-Capillary: 72 mg/dL (ref 70–99)
Glucose-Capillary: 107 mg/dL — ABNORMAL HIGH (ref 70–99)

## 2013-10-17 LAB — TYPE AND SCREEN
ABO/RH(D): A POS
Antibody Screen: NEGATIVE
UNIT DIVISION: 0
UNIT DIVISION: 0
Unit division: 0
Unit division: 0

## 2013-10-17 LAB — CBC
HCT: 32.2 % — ABNORMAL LOW (ref 36.0–46.0)
HEMOGLOBIN: 11.1 g/dL — AB (ref 12.0–15.0)
MCH: 29.4 pg (ref 26.0–34.0)
MCHC: 34.5 g/dL (ref 30.0–36.0)
MCV: 85.4 fL (ref 78.0–100.0)
PLATELETS: 139 10*3/uL — AB (ref 150–400)
RBC: 3.77 MIL/uL — AB (ref 3.87–5.11)
RDW: 15.4 % (ref 11.5–15.5)
WBC: 19.7 10*3/uL — AB (ref 4.0–10.5)

## 2013-10-17 LAB — BASIC METABOLIC PANEL
ANION GAP: 8 (ref 5–15)
BUN: 15 mg/dL (ref 6–23)
CO2: 25 meq/L (ref 19–32)
Calcium: 7.8 mg/dL — ABNORMAL LOW (ref 8.4–10.5)
Chloride: 104 mEq/L (ref 96–112)
Creatinine, Ser: 0.98 mg/dL (ref 0.50–1.10)
GFR calc Af Amer: 70 mL/min — ABNORMAL LOW (ref >90)
GFR calc non Af Amer: 61 mL/min — ABNORMAL LOW (ref >90)
Glucose, Bld: 64 mg/dL — ABNORMAL LOW (ref 70–99)
POTASSIUM: 4.2 meq/L (ref 3.7–5.3)
SODIUM: 137 meq/L (ref 137–147)

## 2013-10-17 MED ORDER — AMIODARONE HCL IN DEXTROSE 360-4.14 MG/200ML-% IV SOLN
60.0000 mg/h | INTRAVENOUS | Status: AC
  Administered 2013-10-17 (×2): 60 mg/h via INTRAVENOUS
  Filled 2013-10-17 (×2): qty 200

## 2013-10-17 MED ORDER — AMIODARONE LOAD VIA INFUSION
150.0000 mg | Freq: Once | INTRAVENOUS | Status: AC
  Administered 2013-10-17: 150 mg via INTRAVENOUS
  Filled 2013-10-17: qty 83.34

## 2013-10-17 MED ORDER — PANTOPRAZOLE SODIUM 40 MG PO TBEC
40.0000 mg | DELAYED_RELEASE_TABLET | Freq: Every day | ORAL | Status: DC
  Administered 2013-10-17 – 2013-10-20 (×4): 40 mg via ORAL
  Filled 2013-10-17 (×4): qty 1

## 2013-10-17 MED ORDER — METOPROLOL TARTRATE 1 MG/ML IV SOLN
10.0000 mg | Freq: Four times a day (QID) | INTRAVENOUS | Status: DC
  Administered 2013-10-17 – 2013-10-18 (×4): 10 mg via INTRAVENOUS
  Filled 2013-10-17 (×4): qty 10

## 2013-10-17 MED ORDER — AMIODARONE HCL IN DEXTROSE 360-4.14 MG/200ML-% IV SOLN
30.0000 mg/h | INTRAVENOUS | Status: DC
  Administered 2013-10-18: 30 mg/h via INTRAVENOUS
  Filled 2013-10-17: qty 200

## 2013-10-17 MED ORDER — CETYLPYRIDINIUM CHLORIDE 0.05 % MT LIQD
7.0000 mL | Freq: Two times a day (BID) | OROMUCOSAL | Status: DC
  Administered 2013-10-17 – 2013-10-19 (×5): 7 mL via OROMUCOSAL

## 2013-10-17 NOTE — Progress Notes (Signed)
Pt scheduled to received levemir this AM but had a low cbg at breakfast. MD made aware. Said to hold insulin. Vwilliams,rn.

## 2013-10-17 NOTE — Progress Notes (Signed)
Pt with elevated bps. No s/s noted.  Pt is currently on amiodarone gtt. MD made aware. Said to cont to monitor bp with no new orders to carry out. Pt resting quietly in bed. Will cont to monitor. Vwilliams,rn.

## 2013-10-17 NOTE — Progress Notes (Addendum)
Patient Name: NANCE MCCOMBS Date of Encounter: 10/17/2013  Active Problems:   Renal mass   Essential hypertension   Length of Stay: 3  SUBJECTIVE  The patient had another episode of SVT at 5 pm yesterday, converted with Adenosine.   CURRENT MEDS . sodium chloride   Intravenous Once  . antiseptic oral rinse  7 mL Mouth Rinse BID  . Influenza vac split quadrivalent PF  0.5 mL Intramuscular Tomorrow-1000  . insulin aspart  0-15 Units Subcutaneous TID WC  . insulin detemir  30 Units Subcutaneous BID  . metoprolol  5 mg Intravenous 4 times per day  . pneumococcal 23 valent vaccine  0.5 mL Intramuscular Tomorrow-1000  . pregabalin  75 mg Oral BID  . sodium chloride  10-40 mL Intracatheter Q12H    OBJECTIVE  Filed Vitals:   10/17/13 0000 10/17/13 0200 10/17/13 0418 10/17/13 0600  BP: 155/83 132/88 150/82 120/66  Pulse:      Temp: 98.4 F (36.9 C)     TempSrc: Oral     Resp: 15 16 18 14   Height:      Weight:      SpO2: 96% 95% 96% 94%    Intake/Output Summary (Last 24 hours) at 10/17/13 0856 Last data filed at 10/17/13 0600  Gross per 24 hour  Intake   2340 ml  Output   1540 ml  Net    800 ml   Filed Weights   10/14/13 1113 10/14/13 1847 10/15/13 0400  Weight: 162 lb 2 oz (73.539 kg) 168 lb 14 oz (76.6 kg) 171 lb 11.8 oz (77.9 kg)    PHYSICAL EXAM  General: Pleasant, NAD. Neuro: Alert and oriented X 3. Moves all extremities spontaneously. Psych: sleepy. HEENT:  Normal  Neck: Supple without bruits or JVD. Lungs:  Resp regular and unlabored, CTA. Heart: RRR no s3, s4, or murmurs. Abdomen: Soft,tender, non-distended, BS + x 4. Surgical scar in the RLQ Extremities: No clubbing, cyanosis or edema. DP/PT/Radials 2+ and equal bilaterally.  Accessory Clinical Findings  CBC  Recent Labs  10/15/13 1740  10/16/13 0430 10/16/13 2000 10/17/13 0700  WBC 22.4*  < > 21.4*  --  19.7*  NEUTROABS 12.3*  --   --   --   --   HGB 7.3*  < > 8.2* 11.2* 11.1*  HCT  21.9*  < > 24.1* 33.6* 32.2*  MCV 84.6  < > 85.5  --  85.4  PLT 109*  < > 158  --  139*  < > = values in this interval not displayed. Basic Metabolic Panel  Recent Labs  10/15/13 0600 10/15/13 1740 10/16/13 0430 10/17/13 0700  NA 135* 135* 137 137  K 5.7* 4.4 3.9 4.2  CL 101 99 103 104  CO2 21 24 25 25   GLUCOSE 239* 204* 115* 64*  BUN 15 23 20 15   CREATININE 1.12* 1.26* 1.14* 0.98  CALCIUM 7.8* 7.7* 7.7* 7.8*  MG  --  2.5 2.3  --   PHOS  --  3.2  --   --    Radiology/Studies  Dg Chest 2 View  10/08/2013   CLINICAL DATA:  Preop for right nephrectomy, long-term smoking history, diabetes  EXAM: CHEST  2 VIEW  COMPARISON:  Chest x-ray of 04/10/2012  FINDINGS: No active infiltrate or effusion is seen. Mediastinal and hilar contours are unchanged. The heart is within normal limits in size. No bony abnormality is seen.  IMPRESSION: No active cardiopulmonary disease.   Electronically  Signed   By: Ivar Drape M.D.   On: 10/08/2013 16:11   TELE: SR 70-90 BPM  Echo: 10/15/2013 Left ventricle: The cavity size was normal. There was moderate concentric hypertrophy. Systolic function was vigorous. The estimated ejection fraction was in the range of 65% to 70%. There was dynamic obstruction at restin the mid cavity, with a peak velocity of 3 cm/sec and a peak gradient of 36 mm Hg. Wall motion was normal; there were no regional wall motion abnormalities. Doppler parameters are consistent with abnormal left ventricular relaxation (grade 1 diastolic dysfunction).    ASSESSMENT AND PLAN  63 year old female with diabetes postop nephrectomy with arrhythmia, premature atrial contractions, transient bradycardia requiring atropine.   1. 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 Overnight only PACs. We will increase Metoprolol to 10 mg iv Q6H, and switch to PO once able to eat. We will consult EP for further  management.  Currently in normal sinus rhythm. Mg normal, calcium low  2. Hyperkalemia - post nephrectomy, resolved  3. Significant hypotension and anemia - hypotension resolved, PRBC transfusions for anemia, Hb 8.2 --> 11.1  4. Hypertension - 10 mg iv Metoprolol Q6H   Signed, Dorothy Spark MD, [facility] 10/17/2013  The case was discussed with EP Dr Virl Axe. He suggested to start loading with amiodarone. We will start the infusion and switch to PO once she is able to take PO meds. She can be discharged in 1-2 days as planned by urology. I will follow her in the outpatient clinic after discharge. The hope is that her SVT resolves once her acute problems resolve. If not, we will plan for ablation.  Dorothy Spark 10/17/2013

## 2013-10-17 NOTE — Progress Notes (Signed)
Pt requesting something for heart burn. MD made aware. New order given for protonix. Vwilliams,rn.Marland Kitchen

## 2013-10-17 NOTE — Progress Notes (Signed)
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   2 - Hemodynamics / Arrythmia - Pt with narrow complex tachy arrythmia in PACU post-op which converted to bradyarrythmia, all asymptomatic. Cardiology consultation obtained, placed on tele, TTE pending. Hgb POD1 drop to 5.7 --> 8.1 after 2upRBC and 1 u platelets and PICC placed. 2 more units pRBC 9/25 with response to Hgb 11.1 9/26. Required adenosine 9/24, 9/25.   3 - Solitary Kidney / Hyperkalemia - mild hyperkalemia w/o EKG changes on post-op labs. Holding K-contianing products. On insulin, not required specific therapy thus far. Cr now <1 post-op with excellent UOP.   Today Ataya continues to feel stronger. Had one episode SVT yesterday that responded promptly to adenosine.  Objective: Vital signs in last 24 hours: Temp:  [97.9 F (36.6 C)-99 F (37.2 C)] 98.2 F (36.8 C) (09/26 0800) Pulse Rate:  [78-88] 83 (09/25 1800) Resp:  [14-29] 14 (09/26 0600) BP: (116-165)/(66-93) 120/66 mmHg (09/26 0600) SpO2:  [92 %-99 %] 94 % (09/26 0600) Last BM Date: 10/14/13  Intake/Output from previous day: 09/25 0701 - 09/26 0700 In: 2415 [I.V.:1725; Blood:590; IV Piggyback:100] Out: 1640 [Urine:1640] Intake/Output this shift:    General appearance: alert, cooperative and appears stated age Head: Normocephalic, without obvious abnormality, atraumatic Nose: Nares normal. Septum midline. Mucosa normal. No drainage or sinus tenderness. Throat: lips, mucosa, and tongue normal; teeth and gums normal Neck: supple, symmetrical, trachea midline Back: symmetric, no curvature. ROM normal. No CVA tenderness. Resp: non-labored Chest wall: no tenderness Cardio: Nl rate by bedside monitor. GI: soft, non-tender; bowel sounds normal; no  masses,  no organomegaly Pelvic: external genitalia normal and catheter now out this AM Extremities: extremities normal, atraumatic, no cyanosis or edema Pulses: 2+ and symmetric Skin: Skin color, texture, turgor normal. No rashes or lesions Lymph nodes: Cervical, supraclavicular, and axillary nodes normal. Neurologic: Grossly normal Incision/Wound: recent port and extraction sites c/d/i. PICC site c/d/i.   Lab Results:   Recent Labs  10/16/13 0430 10/16/13 2000 10/17/13 0700  WBC 21.4*  --  19.7*  HGB 8.2* 11.2* 11.1*  HCT 24.1* 33.6* 32.2*  PLT 158  --  139*   BMET  Recent Labs  10/16/13 0430 10/17/13 0700  NA 137 137  K 3.9 4.2  CL 103 104  CO2 25 25  GLUCOSE 115* 64*  BUN 20 15  CREATININE 1.14* 0.98  CALCIUM 7.7* 7.8*   PT/INR No results found for this basename: LABPROT, INR,  in the last 72 hours ABG No results found for this basename: PHART, PCO2, PO2, HCO3,  in the last 72 hours  Studies/Results: No results found.  Anti-infectives: Anti-infectives   Start     Dose/Rate Route Frequency Ordered Stop   10/14/13 1042  ceFAZolin (ANCEF) IVPB 2 g/50 mL premix     2 g 100 mL/hr over 30 Minutes Intravenous 30 min pre-op 10/14/13 1042 10/14/13 1339      Assessment/Plan:  1 - Multifocal Right Renal Masses - final path with negative margins discussed with patient.   2 - Hemodynamics / Arrythmia - Hgb now >10 but still with some SVT episodes, appreciate cardiology help. Fortunatley this remains asymptomatic. Cards consulting EP, agree and appreciate.   3 - Solitary Kidney / Hyperkalemia - GFR preserved, K normal past 48 hours.  4 - Remain ICU, but ambulate, DC foley.   [facility], Lewi Drost 10/17/2013

## 2013-10-18 LAB — CBC
HEMATOCRIT: 32.6 % — AB (ref 36.0–46.0)
HEMOGLOBIN: 11.1 g/dL — AB (ref 12.0–15.0)
MCH: 29.4 pg (ref 26.0–34.0)
MCHC: 34 g/dL (ref 30.0–36.0)
MCV: 86.2 fL (ref 78.0–100.0)
Platelets: 151 10*3/uL (ref 150–400)
RBC: 3.78 MIL/uL — ABNORMAL LOW (ref 3.87–5.11)
RDW: 15.1 % (ref 11.5–15.5)
WBC: 17.4 10*3/uL — ABNORMAL HIGH (ref 4.0–10.5)

## 2013-10-18 LAB — BASIC METABOLIC PANEL
Anion gap: 9 (ref 5–15)
BUN: 11 mg/dL (ref 6–23)
CO2: 27 mEq/L (ref 19–32)
CREATININE: 0.84 mg/dL (ref 0.50–1.10)
Calcium: 8.4 mg/dL (ref 8.4–10.5)
Chloride: 102 mEq/L (ref 96–112)
GFR calc Af Amer: 85 mL/min — ABNORMAL LOW (ref >90)
GFR, EST NON AFRICAN AMERICAN: 73 mL/min — AB (ref >90)
GLUCOSE: 133 mg/dL — AB (ref 70–99)
POTASSIUM: 4.2 meq/L (ref 3.7–5.3)
Sodium: 138 mEq/L (ref 137–147)

## 2013-10-18 LAB — GLUCOSE, CAPILLARY
GLUCOSE-CAPILLARY: 112 mg/dL — AB (ref 70–99)
GLUCOSE-CAPILLARY: 112 mg/dL — AB (ref 70–99)
GLUCOSE-CAPILLARY: 115 mg/dL — AB (ref 70–99)
Glucose-Capillary: 120 mg/dL — ABNORMAL HIGH (ref 70–99)
Glucose-Capillary: 123 mg/dL — ABNORMAL HIGH (ref 70–99)

## 2013-10-18 MED ORDER — AMLODIPINE BESYLATE 5 MG PO TABS
5.0000 mg | ORAL_TABLET | Freq: Every day | ORAL | Status: DC
  Administered 2013-10-18: 5 mg via ORAL
  Filled 2013-10-18: qty 1

## 2013-10-18 MED ORDER — SODIUM CHLORIDE 0.9 % IV BOLUS (SEPSIS)
250.0000 mL | Freq: Once | INTRAVENOUS | Status: AC
  Administered 2013-10-18: 250 mL via INTRAVENOUS

## 2013-10-18 MED ORDER — ONDANSETRON HCL 4 MG/2ML IJ SOLN
4.0000 mg | INTRAMUSCULAR | Status: DC | PRN
  Administered 2013-10-18: 4 mg via INTRAVENOUS
  Filled 2013-10-18: qty 2

## 2013-10-18 MED ORDER — AMIODARONE HCL 200 MG PO TABS
400.0000 mg | ORAL_TABLET | Freq: Two times a day (BID) | ORAL | Status: DC
  Administered 2013-10-18 – 2013-10-20 (×5): 400 mg via ORAL
  Filled 2013-10-18 (×5): qty 2

## 2013-10-18 MED ORDER — BISACODYL 10 MG RE SUPP
10.0000 mg | Freq: Once | RECTAL | Status: AC
  Administered 2013-10-18: 10 mg via RECTAL
  Filled 2013-10-18: qty 1

## 2013-10-18 MED ORDER — METOPROLOL TARTRATE 25 MG PO TABS
25.0000 mg | ORAL_TABLET | Freq: Two times a day (BID) | ORAL | Status: DC
  Administered 2013-10-18 – 2013-10-20 (×5): 25 mg via ORAL
  Filled 2013-10-18 (×5): qty 1

## 2013-10-18 NOTE — Progress Notes (Signed)
Pt did not void for several hours. Bladder scan showed 400 mL of urine.  In and out catheter with 550 mL of urine output.   Odis Hollingshead, Student Nurse  Amy Roselie Awkward RN

## 2013-10-18 NOTE — Progress Notes (Signed)
Orders received from Dr. Tresa Moore.

## 2013-10-18 NOTE — Progress Notes (Signed)
Dr. Tresa Moore notified of patient's sbp sustaining >180. Per Dr. Tresa Moore no interventions at this time d/t patient's history of being labile. Will continue to monitor. Cardiology to see patient in AM.

## 2013-10-18 NOTE — Progress Notes (Signed)
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   2 - Hemodynamics / Arrythmia - Pt with narrow complex tachy arrythmia in PACU post-op which converted to bradyarrythmia, all asymptomatic. Cardiology consultation obtained, placed on tele, TTE pending. Hgb POD1 drop to 5.7 --> 8.1 after 2upRBC and 1 u platelets and PICC placed. 2 more units pRBC 9/25 with response to Hgb 11.1 9/26. Required adenosine 9/24, 9/25. On amiodarone + B blocker per cardiology transitioning to PO.   3 - Solitary Kidney / Hyperkalemia - mild hyperkalemia w/o EKG changes on post-op labs. Holding K-contianing products. On insulin, not required specific therapy thus far. Cr now <1 post-op with excellent UOP.   Today Maycie is stable. One episode transient urinary retention last PM managed with I/O cath x 1.  Objective: Vital signs in last 24 hours: Temp:  [98.2 F (36.8 C)-98.9 F (37.2 C)] 98.4 F (36.9 C) (09/27 0400) Resp:  [10-23] 18 (09/27 0700) BP: (151-205)/(76-103) 169/89 mmHg (09/27 0800) SpO2:  [89 %-100 %] 99 % (09/27 0800) Weight:  [82.8 kg (182 lb 8.7 oz)] 82.8 kg (182 lb 8.7 oz) (09/27 0400) Last BM Date: 10/14/13  Intake/Output from previous day: 09/26 0701 - 09/27 0700 In: 1466.8 [I.V.:1466.8] Out: 1810 [Urine:1810] Intake/Output this shift: Total I/O In: 36.7 [I.V.:36.7] Out: -   General appearance: alert, cooperative and appears stated age Head: Normocephalic, without obvious abnormality, atraumatic Nose: Nares normal. Septum midline. Mucosa normal. No drainage or sinus tenderness. Throat: lips, mucosa, and tongue normal; teeth and gums normal Neck: supple, symmetrical, trachea midline Back: symmetric, no curvature. ROM normal. No CVA tenderness. Resp: non-labored.  Cardio:  Nl rate Extremities: extremities normal, atraumatic, no cyanosis or edema Pulses: 2+ and symmetric Skin: Skin color, texture, turgor normal. No rashes or lesions Lymph nodes: Cervical, supraclavicular, and axillary nodes normal. Neurologic: Grossly normal Incision/Wound: Recent port sites and extraction sites with stable ecchymoses.   Lab Results:   Recent Labs  10/17/13 0700 10/18/13 0510  WBC 19.7* 17.4*  HGB 11.1* 11.1*  HCT 32.2* 32.6*  PLT 139* 151   BMET  Recent Labs  10/17/13 0700 10/18/13 0510  NA 137 138  K 4.2 4.2  CL 104 102  CO2 25 27  GLUCOSE 64* 133*  BUN 15 11  CREATININE 0.98 0.84  CALCIUM 7.8* 8.4   PT/INR No results found for this basename: LABPROT, INR,  in the last 72 hours ABG No results found for this basename: PHART, PCO2, PO2, HCO3,  in the last 72 hours  Studies/Results: No results found.  Anti-infectives: Anti-infectives   Start     Dose/Rate Route Frequency Ordered Stop   10/14/13 1042  ceFAZolin (ANCEF) IVPB 2 g/50 mL premix     2 g 100 mL/hr over 30 Minutes Intravenous 30 min pre-op 10/14/13 1042 10/14/13 1339      Assessment/Plan:  1 - Multifocal Right Renal Masses - final path with negative margins discussed with patient.   2 - Hemodynamics / Arrythmia - Hgb now >10 but still with some SVT episodes, appreciate cardiology help. Fortunatley this remains asymptomatic. Transitioning to PO meds today in preparation for DC home as soon.   3 - Solitary Kidney / Hyperkalemia - GFR preserved, K normal.   4 - Remain ICU for close cardiac monitoring in  case needs adenosine again, continue ambulation, start tamsulosin to hlep with likely transient post-op retention. Marland Kitchen  [employer], Zafar Debrosse 10/18/2013

## 2013-10-18 NOTE — Progress Notes (Signed)
Patient Name: Cassandra Jordan Date of Encounter: 10/18/2013  Active Problems:   Renal mass   Essential hypertension   Length of Stay: 4  SUBJECTIVE  No more arrhythmias, now able to take PO meds, hypertensive.  CURRENT MEDS . sodium chloride   Intravenous Once  . antiseptic oral rinse  7 mL Mouth Rinse BID  . insulin aspart  0-15 Units Subcutaneous TID WC  . insulin detemir  30 Units Subcutaneous BID  . metoprolol  10 mg Intravenous 4 times per day  . pantoprazole  40 mg Oral Daily  . pneumococcal 23 valent vaccine  0.5 mL Intramuscular Tomorrow-1000  . pregabalin  75 mg Oral BID  . sodium chloride  10-40 mL Intracatheter Q12H   . sodium chloride 20 mL/hr at 10/18/13 0343  . sodium chloride 10 mL/hr at 10/15/13 1815  . amiodarone 30 mg/hr (10/18/13 0138)   OBJECTIVE  Filed Vitals:   10/18/13 0500 10/18/13 0600 10/18/13 0700 10/18/13 0800  BP: 151/92 166/89 164/97 169/89  Pulse:      Temp:      TempSrc:      Resp: 10 10 18    Height:      Weight:      SpO2: 98% 100% 99% 99%    Intake/Output Summary (Last 24 hours) at 10/18/13 0843 Last data filed at 10/18/13 0800  Gross per 24 hour  Intake 1503.53 ml  Output   1810 ml  Net -306.47 ml   Filed Weights   10/14/13 1847 10/15/13 0400 10/18/13 0400  Weight: 168 lb 14 oz (76.6 kg) 171 lb 11.8 oz (77.9 kg) 182 lb 8.7 oz (82.8 kg)    PHYSICAL EXAM  General: Pleasant, NAD. Neuro: Alert and oriented X 3. Moves all extremities spontaneously. Psych: sleepy. HEENT:  Normal  Neck: Supple without bruits or JVD. Lungs:  Resp regular and unlabored, CTA. Heart: RRR no s3, s4, or murmurs. Abdomen: Soft,tender, non-distended, BS + x 4. Surgical scar in the RLQ Extremities: No clubbing, cyanosis or edema. DP/PT/Radials 2+ and equal bilaterally.  Accessory Clinical Findings  CBC  Recent Labs  10/15/13 1740  10/17/13 0700 10/18/13 0510  WBC 22.4*  < > 19.7* 17.4*  NEUTROABS 12.3*  --   --   --   HGB 7.3*  < >  11.1* 11.1*  HCT 21.9*  < > 32.2* 32.6*  MCV 84.6  < > 85.4 86.2  PLT 109*  < > 139* 151  < > = values in this interval not displayed. Basic Metabolic Panel  Recent Labs  10/15/13 1740 10/16/13 0430 10/17/13 0700 10/18/13 0510  NA 135* 137 137 138  K 4.4 3.9 4.2 4.2  CL 99 103 104 102  CO2 24 25 25 27   GLUCOSE 204* 115* 64* 133*  BUN 23 20 15 11   CREATININE 1.26* 1.14* 0.98 0.84  CALCIUM 7.7* 7.7* 7.8* 8.4  MG 2.5 2.3  --   --   PHOS 3.2  --   --   --    Radiology/Studies  Dg Chest 2 View  10/08/2013   CLINICAL DATA:  Preop for right nephrectomy, long-term smoking history, diabetes  EXAM: CHEST  2 VIEW  COMPARISON:  Chest x-ray of 04/10/2012  FINDINGS: No active infiltrate or effusion is seen. Mediastinal and hilar contours are unchanged. The heart is within normal limits in size. No bony abnormality is seen.  IMPRESSION: No active cardiopulmonary disease.   Electronically Signed   By: Windy Canny.D.  On: 10/08/2013 16:11   TELE: SR 70-90 BPM  Echo: 10/15/2013 Left ventricle: The cavity size was normal. There was moderate concentric hypertrophy. Systolic function was vigorous. The estimated ejection fraction was in the range of 65% to 70%. There was dynamic obstruction at restin the mid cavity, with a peak velocity of 3 cm/sec and a peak gradient of 36 mm Hg. Wall motion was normal; there were no regional wall motion abnormalities. Doppler parameters are consistent with abnormal left ventricular relaxation (grade 1 diastolic dysfunction).    ASSESSMENT AND PLAN  63 year old female with diabetes postop nephrectomy with arrhythmia, premature atrial contractions, transient bradycardia requiring atropine.   1. 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 Overnight only PACs. Currently in normal sinus rhythm. Mg, Ca, K normal. - Switch metoprolol to 25 mg po BID. - switch amiodarone to 400  mg PO BID x 4 days, then 200 mg PO BID x 4 days, then 200 mg PO daily. She can be discharged in 1-2 days as planned by urology. I will follow her in the outpatient clinic after discharge. The hope is that her SVT resolves once her acute problems resolve. If not, we will plan for ablation.  2. Hyperkalemia - post nephrectomy, resolved  3. Significant hypotension and anemia - hypotension resolved, PRBC transfusions for anemia, Hb 8.2 --> 11.1  4. Hypertension - switch metoprolol to 25 mg PO BID, add amlodipine 2.5 mg PO daily  I think she will be ready for dischrage tomorrow, we will arrange for outpatient follow up with our clinic.  Signed, Dorothy Spark MD, [facility] 10/18/2013

## 2013-10-18 NOTE — Progress Notes (Signed)
Pt's BP dropped to 35K systolic with norvasc and metoprolol.  I d/c'd norvasc and held metoprolol for tonight but will try tomorrow.  250cc bolus given.

## 2013-10-18 NOTE — Progress Notes (Signed)
Pt c/o lower right quadrant abdominal pain 9/10.  Pt says this is a recurring pain. Abdomen soft and tender with ecchymosis across the lower portion.  Bladder scan says 42cc urine and pt said she has had no bowel movement for 4 days.  Dr.Manny service paged. Dilaudid given for pain.

## 2013-10-19 LAB — GLUCOSE, CAPILLARY
GLUCOSE-CAPILLARY: 144 mg/dL — AB (ref 70–99)
GLUCOSE-CAPILLARY: 142 mg/dL — AB (ref 70–99)
Glucose-Capillary: 59 mg/dL — ABNORMAL LOW (ref 70–99)
Glucose-Capillary: 128 mg/dL — ABNORMAL HIGH (ref 70–99)
Glucose-Capillary: 115 mg/dL — ABNORMAL HIGH (ref 70–99)

## 2013-10-19 MED ORDER — FUROSEMIDE 10 MG/ML IJ SOLN
20.0000 mg | Freq: Once | INTRAMUSCULAR | Status: AC
  Administered 2013-10-19: 20 mg via INTRAVENOUS
  Filled 2013-10-19: qty 2

## 2013-10-19 MED ORDER — SENNOSIDES-DOCUSATE SODIUM 8.6-50 MG PO TABS
1.0000 | ORAL_TABLET | Freq: Two times a day (BID) | ORAL | Status: DC
  Administered 2013-10-19 – 2013-10-20 (×3): 1 via ORAL
  Filled 2013-10-19 (×2): qty 1

## 2013-10-19 NOTE — Progress Notes (Signed)
Patient has had malodorous urine.  Also note patient to have twitching to her extremities which was noted by other shifts as well.  Dr. Tresa Moore office notified.

## 2013-10-19 NOTE — Progress Notes (Signed)
Received call from Dr. Wynetta Emery, [facility] resident on-call for Dr. Tresa Moore who verbalized that, "there were not strong enough reasons to warrant any orders for this patient" and said Dr. Tresa Moore could assess the situation in the AM.

## 2013-10-19 NOTE — Progress Notes (Signed)
5 Days Post-Op  Subjective:  1 - Multifocal Right Renal Masses - s/p robotic radical nephrectomy 9/23, the day of admission, for multifocal enhancing right renal masses, path localized clear cell carcinomas with at least 5 separate foci, largest 2cm, margins negative.   2 - Hemodynamics / Arrythmia - Pt with narrow complex tachy arrythmia in PACU post-op which converted to bradyarrythmia, all asymptomatic. Cardiology consultation obtained, placed on tele, TTE pending. Hgb POD1 drop to 5.7 --> 8.1 after 2upRBC and 1 u platelets and PICC placed. 2 more units pRBC 9/25 with response to Hgb 11.1 9/26. Required adenosine 9/24, 9/25. Placed on amiodarone + B blocker + Ca Channel blocker per cardiology transitioning to PO.   3 - Solitary Kidney / Hyperkalemia - mild hyperkalemia w/o EKG changes on post-op labs. Holding K-contianing products. On insulin, not required specific therapy thus far. Cr now <1 post-op with excellent UOP.   Today Cassandra Jordan is stable. No BM in few days, no arrythmia overnight. Did hold norvasc for some asymptomatic hypotension.   Objective: Vital signs in last 24 hours: Temp:  [98.1 F (36.7 C)-98.8 F (37.1 C)] 98.3 F (36.8 C) (09/28 0400) Pulse Rate:  [83-88] 88 (09/27 2233) Resp:  [9-23] 9 (09/28 0600) BP: (80-178)/(51-106) 132/65 mmHg (09/28 0400) SpO2:  [85 %-100 %] 100 % (09/28 0600) Weight:  [83 kg (182 lb 15.7 oz)] 83 kg (182 lb 15.7 oz) (09/28 0400) Last BM Date: 10/16/13  Intake/Output from previous day: 09/27 0701 - 09/28 0700 In: 930.1 [P.O.:290; I.V.:640.1] Out: 1465 [Urine:1465] Intake/Output this shift:    General appearance: alert, cooperative and appears stated age Head: Normocephalic, without obvious abnormality, atraumatic Neck: supple, symmetrical, trachea midline Back: symmetric, no curvature. ROM normal. No CVA tenderness. Resp: non-labored Cardio: Nl rate GI: soft, non-tender; bowel sounds normal; no masses,  no organomegaly Extremities:  extremities normal, atraumatic, no cyanosis or edema and RUE PICC c/d/i. Pulses: 2+ and symmetric Skin: Skin color, texture, turgor normal. No rashes or lesions Lymph nodes: Cervical, supraclavicular, and axillary nodes normal. Neurologic: Grossly normal Incision/Wound: Recent incision sites c/d/i. Stable ecchymoses inferior to midline extraction site.   Lab Results:   Recent Labs  10/17/13 0700 10/18/13 0510  WBC 19.7* 17.4*  HGB 11.1* 11.1*  HCT 32.2* 32.6*  PLT 139* 151   BMET  Recent Labs  10/17/13 0700 10/18/13 0510  NA 137 138  K 4.2 4.2  CL 104 102  CO2 25 27  GLUCOSE 64* 133*  BUN 15 11  CREATININE 0.98 0.84  CALCIUM 7.8* 8.4   PT/INR No results found for this basename: LABPROT, INR,  in the last 72 hours ABG No results found for this basename: PHART, PCO2, PO2, HCO3,  in the last 72 hours  Studies/Results: No results found.  Anti-infectives: Anti-infectives   Start     Dose/Rate Route Frequency Ordered Stop   10/14/13 1042  ceFAZolin (ANCEF) IVPB 2 g/50 mL premix     2 g 100 mL/hr over 30 Minutes Intravenous 30 min pre-op 10/14/13 1042 10/14/13 1339      Assessment/Plan:  1 - Multifocal Right Renal Masses - final path with negative margins discussed with patient.   2 - Hemodynamics / Arrythmia - Hgb now >10 but still with some SVT episodes, appreciate cardiology help. Fortunatley this remains asymptomatic. Transitioning to what will hopefully be PO home regimen.  3 - Solitary Kidney / Hyperkalemia - GFR preserved, K normal.   4 - Remain ICU for close cardiac monitoring in  case needs adenosine again, continue ambulation, ducolax x1 and restart BID seenakot-X for bowel proph, saline lock. Possible home today in afternoon v. Tomorrow pending cardiac issues.   [facility], Cassandra Jordan 10/19/2013

## 2013-10-19 NOTE — Progress Notes (Signed)
Subjective:  No CP/SOB, S/P Right nephrectomy  Objective:  Temp:  [98.2 F (36.8 C)-98.8 F (37.1 C)] 98.3 F (36.8 C) (09/28 0400) Pulse Rate:  [83-88] 88 (09/27 2233) Resp:  [9-23] 9 (09/28 0600) BP: (80-178)/(51-106) 132/65 mmHg (09/28 0400) SpO2:  [85 %-100 %] 100 % (09/28 0600) Weight:  [182 lb 15.7 oz (83 kg)] 182 lb 15.7 oz (83 kg) (09/28 0400) Weight change: 7.1 oz (0.2 kg)  Intake/Output from previous day: 09/27 0701 - 09/28 0700 In: 930.1 [P.O.:290; I.V.:640.1] Out: 1465 [Urine:1465]  Intake/Output from this shift: Total I/O In: -  Out: 300 [Urine:300]  Physical Exam: General appearance: alert and no distress Neck: no adenopathy, no carotid bruit, no JVD, supple, symmetrical, trachea midline and thyroid not enlarged, symmetric, no tenderness/mass/nodules Lungs: clear to auscultation bilaterally Heart: regular rate and rhythm, S1, S2 normal, no murmur, click, rub or gallop Extremities: extremities normal, atraumatic, no cyanosis or edema  Lab Results: Results for orders placed during the hospital encounter of 10/14/13 (from the past 48 hour(s))  GLUCOSE, CAPILLARY     Status: None   Collection Time    10/17/13  9:05 AM      Result Value Ref Range   Glucose-Capillary 72  70 - 99 mg/dL   Comment 1 Documented in Chart     Comment 2 Notify RN    GLUCOSE, CAPILLARY     Status: None   Collection Time    10/17/13 12:34 PM      Result Value Ref Range   Glucose-Capillary 94  70 - 99 mg/dL  GLUCOSE, CAPILLARY     Status: Abnormal   Collection Time    10/17/13  4:21 PM      Result Value Ref Range   Glucose-Capillary 107 (*) 70 - 99 mg/dL   Comment 1 Documented in Chart     Comment 2 Notify RN    GLUCOSE, CAPILLARY     Status: Abnormal   Collection Time    10/17/13 11:04 PM      Result Value Ref Range   Glucose-Capillary 120 (*) 70 - 99 mg/dL  CBC     Status: Abnormal   Collection Time    10/18/13  5:10 AM      Result Value Ref Range   WBC 17.4 (*)  4.0 - 10.5 K/uL   RBC 3.78 (*) 3.87 - 5.11 MIL/uL   Hemoglobin 11.1 (*) 12.0 - 15.0 g/dL   HCT 32.6 (*) 36.0 - 46.0 %   MCV 86.2  78.0 - 100.0 fL   MCH 29.4  26.0 - 34.0 pg   MCHC 34.0  30.0 - 36.0 g/dL   RDW 15.1  11.5 - 15.5 %   Platelets 151  150 - 400 K/uL  BASIC METABOLIC PANEL     Status: Abnormal   Collection Time    10/18/13  5:10 AM      Result Value Ref Range   Sodium 138  137 - 147 mEq/L   Potassium 4.2  3.7 - 5.3 mEq/L   Chloride 102  96 - 112 mEq/L   CO2 27  19 - 32 mEq/L   Glucose, Bld 133 (*) 70 - 99 mg/dL   BUN 11  6 - 23 mg/dL   Creatinine, Ser 0.84  0.50 - 1.10 mg/dL   Calcium 8.4  8.4 - 10.5 mg/dL   GFR calc non Af Amer 73 (*) >90 mL/min   GFR calc Af Amer 85 (*) >90 mL/min  Comment: (NOTE)     The eGFR has been calculated using the CKD EPI equation.     This calculation has not been validated in all clinical situations.     eGFR's persistently <90 mL/min signify possible Chronic Kidney     Disease.   Anion gap 9  5 - 15  GLUCOSE, CAPILLARY     Status: Abnormal   Collection Time    10/18/13  8:30 AM      Result Value Ref Range   Glucose-Capillary 112 (*) 70 - 99 mg/dL   Comment 1 Documented in Chart     Comment 2 Notify RN    GLUCOSE, CAPILLARY     Status: Abnormal   Collection Time    10/18/13 12:13 PM      Result Value Ref Range   Glucose-Capillary 112 (*) 70 - 99 mg/dL   Comment 1 Documented in Chart     Comment 2 Notify RN    GLUCOSE, CAPILLARY     Status: Abnormal   Collection Time    10/18/13  3:40 PM      Result Value Ref Range   Glucose-Capillary 115 (*) 70 - 99 mg/dL   Comment 1 Documented in Chart     Comment 2 Notify RN    GLUCOSE, CAPILLARY     Status: Abnormal   Collection Time    10/18/13 10:27 PM      Result Value Ref Range   Glucose-Capillary 123 (*) 70 - 99 mg/dL    Imaging: Imaging results have been reviewed  Tele: NSR 80 w/o arrhthymias over night   Assessment/Plan:   1. Active Problems: 2.   Renal mass 3.    Essential hypertension 4.   Time Spent Directly with Patient:  20 minutes  Length of Stay:  LOS: 5 days   Maintaining NSR w/o recurrent arrhythmias on PO amio load and BB. Exam benign. OK to be D/Cd from our point of view. We will arrange OP follow up with Dr. Meda Coffee.   Lorretta Harp 10/19/2013, 8:37 AM

## 2013-10-19 NOTE — Progress Notes (Signed)
CARE MANAGEMENT NOTE 10/19/2013  Patient:  AVIA, MERKLEY   Account Number:  [id number]  Date Initiated:  10/15/2013  Documentation initiated by:  Envi Eagleson  Subjective/Objective Assessment:   Multifocal Right Solid Enhancing Renal Masses/Multifocal Right Solid Enhancing Renal Masses/ Left Ovarian Mass/     Action/Plan:   from home with wife/Past Medical History:  .  Diabetes mellitus w/o complication  .  Hypertension  .  Asthma  .  Neuropathic pain  .  Hepatitis C  .  Renal carcinoma  .  Hyperlipidemia  . GERD   Anticipated DC Date:  10/22/2013   Anticipated DC Plan:  HOME/SELF CARE  In-house referral  NA      DC Planning Services  NA      PAC Choice  NA   Choice offered to / List presented to:  NA   DME arranged  NA      DME agency  NA     [place] arranged  NA      [place] agency  NA   Status of service:  In process, will continue to follow Medicare Important Message given?  NA - LOS <3 / Initial given by admissions (If response is "NO", the following Medicare IM given date fields will be blank) Date Medicare IM given:   Medicare IM given by:   Date Additional Medicare IM given:   Additional Medicare IM given by:    Discharge Disposition:    Per UR Regulation:  Reviewed for med. necessity/level of care/duration of stay  If discussed at [place] of Stay Meetings, dates discussed:    Comments:  [id number]/Latysha Thackston Eldridge Dace, [place], [place] [phone] Chart Reviewed for discharge and hospital needs. Discharge needs at time of review: None present will follow for needs. CARDIOLOGY SIGNED OFF [id number]  [id number] L. Rosana Hoes, RN, BSN, CCM: Discharge needs reviewed at time of this chart review.

## 2013-10-19 NOTE — Progress Notes (Signed)
Spoke with Dr. Wynetta Emery on-call for Dr. Tresa Moore.  Informed him of pt now having urinary frequency, burning with urination and malodorous urine.  Pt also requested that I call the Dr again.  Orders received for U/A and culture.  No further orders at this time.

## 2013-10-19 NOTE — Progress Notes (Signed)
During lunch break, AD Junie Panning brought to my attention patient was c/o left sided weakness. Junie Panning stated she performed push/pull grips and smile assessment, all of which were unremarkable. I followed-up and performed neuro assessment including push/pulls, grips, pupil checks, orientation questions, smile, and drifting. Patient performed all tasks within normal range. MD Tresa Moore was paged and made aware of patient complaint and RN follow-up. No new orders placed. Will continue to monitor.

## 2013-10-20 LAB — URINALYSIS, ROUTINE W REFLEX MICROSCOPIC
BILIRUBIN URINE: NEGATIVE
GLUCOSE, UA: NEGATIVE mg/dL
Hgb urine dipstick: NEGATIVE
Ketones, ur: NEGATIVE mg/dL
Leukocytes, UA: NEGATIVE
Nitrite: NEGATIVE
PH: 5 (ref 5.0–8.0)
Protein, ur: NEGATIVE mg/dL
SPECIFIC GRAVITY, URINE: 1.008 (ref 1.005–1.030)
Urobilinogen, UA: 0.2 mg/dL (ref 0.0–1.0)

## 2013-10-20 LAB — GLUCOSE, CAPILLARY: Glucose-Capillary: 74 mg/dL (ref 70–99)

## 2013-10-20 MED ORDER — AMIODARONE HCL 400 MG PO TABS
200.0000 mg | ORAL_TABLET | Freq: Two times a day (BID) | ORAL | Status: DC
Start: 2013-10-20 — End: 2013-11-04

## 2013-10-20 MED ORDER — METOPROLOL TARTRATE 25 MG PO TABS: 25.0000 mg | ORAL_TABLET | Freq: Two times a day (BID) | ORAL | Status: DC

## 2013-10-20 MED ORDER — SENNOSIDES-DOCUSATE SODIUM 8.6-50 MG PO TABS: 1.0000 | ORAL_TABLET | Freq: Two times a day (BID) | ORAL | Status: DC

## 2013-10-20 MED ORDER — OXYCODONE-ACETAMINOPHEN 5-325 MG PO TABS: 1.0000 | ORAL_TABLET | Freq: Four times a day (QID) | ORAL | Status: DC | PRN

## 2013-10-20 NOTE — Progress Notes (Signed)
Reviewed discharge instructions with patient and patient's daughter.  Patient dressed and d/c in wheelchair with daughter.  No questions from daughter or patient.  Gerlean Cid Roselie Awkward RN

## 2013-10-20 NOTE — Discharge Summary (Signed)
Physician Discharge Summary  Patient ID: CORALIE STANKE MRN: [id number] DOB/AGE: 25-Jul-1950 63 y.o.  Admit date: 10/14/2013 Discharge date: 10/20/2013  Admission Diagnoses: Multifocal Right Renal Masses   Discharge Diagnoses:  Active Problems:   Renal mass   Essential hypertension   Discharged Condition: good  Hospital Course:   1 - Multifocal Right Renal Masses - s/p robotic radical nephrectomy 9/23, the day of admission, for multifocal enhancing right renal masses, path localized clear cell carcinomas with at least 5 separate foci, largest 2cm, margins negative.   2 - Acute Blood Loss Anemia, Tachyarrythmia - Pt with narrow complex tachy arrythmia in PACU post-op which converted to bradyarrythmia, all asymptomatic. Cardiology consultation obtained, placed on tele, TTE pending. Hgb POD1 drop to 5.7 --> 8.1 after 2upRBC and 1 u platelets and PICC placed. 2 more units pRBC 9/25 with response to Hgb 11.1 9/26. Required adenosine 9/24, 9/25. Placed on amiodarone + B blocker and no arrythmia x 48 hours at discharge. Will f/u with Dr. Meda Coffee cardiology as outpatient.   3 - Solitary Kidney / Hyperkalemia - mild hyperkalemia w/o EKG changes on post-op labs. Holding K-contianing products. On insulin, not required specific therapy thus far. Cr now <1 post-op with excellent UOP.   Consults: cardiology  Significant Diagnostic Studies: labs: Hgb >11 at discharge.   Treatments: surgery: robotic radical nephrectomy 9/23, chemical cardioversion with adenosine x2  Discharge Exam: Blood pressure 110/62, pulse 77, temperature 98.4 F (36.9 C), temperature source Oral, resp. rate 12, height 5' 2"  (1.575 m), weight 90.1 kg (198 lb 10.2 oz), SpO2 100.00%. General appearance: alert, cooperative and appears stated age Head: Normocephalic, without obvious abnormality, atraumatic Nose: Nares normal. Septum midline. Mucosa normal. No drainage or sinus tenderness. Throat: lips, mucosa, and tongue normal;  teeth and gums normal Back: symmetric, no curvature. ROM normal. No CVA tenderness. Resp: non-labored on room air. Sats >90.  Cardio: Nl rate GI: soft, non-tender; bowel sounds normal; no masses,  no organomegaly Extremities: extremities normal, atraumatic, no cyanosis or edema Pulses: 2+ and symmetric Skin: Skin color, texture, turgor normal. No rashes or lesions Lymph nodes: Cervical, supraclavicular, and axillary nodes normal. Neurologic: Grossly normal Incision/Wound: stable wound ecchymoses  Disposition: 01-Home or Self Care     Medication List    STOP taking these medications       HYDROcodone-acetaminophen 10-325 MG per tablet  Commonly known as:  NORCO     potassium citrate 10 MEQ (1080 MG) SR tablet  Commonly known as:  UROCIT-K      TAKE these medications       amiodarone 400 MG tablet  Commonly known as:  PACERONE  Take 0.5 tablets (200 mg total) by mouth 2 (two) times daily.     insulin detemir 100 UNIT/ML injection  Commonly known as:  LEVEMIR  Inject 30 Units into the skin 2 (two) times daily.     metoprolol tartrate 25 MG tablet  Commonly known as:  LOPRESSOR  Take 1 tablet (25 mg total) by mouth 2 (two) times daily.     oxyCODONE-acetaminophen 5-325 MG per tablet  Commonly known as:  ROXICET  Take 1-2 tablets by mouth every 6 (six) hours as needed for moderate pain or severe pain. Post-operaitvely     pregabalin 75 MG capsule  Commonly known as:  LYRICA  Take 75 mg by mouth 2 (two) times daily.     senna-docusate 8.6-50 MG per tablet  Commonly known as:  Senokot-S  Take 1 tablet by mouth  2 (two) times daily. While taking pain meds to prevent constipation      ASK your doctor about these medications       lisinopril-hydrochlorothiazide 20-12.5 MG per tablet  Commonly known as:  PRINZIDE,ZESTORETIC  Take 1 tablet by mouth every morning.     MOTRIN PM 200-38 MG Tabs  Generic drug:  Ibuprofen-Diphenhydramine Cit  Take 1 tablet by mouth at  bedtime as needed (pain/sleep).           Follow-up Information   Follow up with [name], MD On 11/02/2013. (at 10:30)    Specialty:  Urology   Contact information:   [place] [address] [phone]       Signed: [name] 10/20/2013, 7:41 AM

## 2013-10-23 LAB — URINE CULTURE

## 2013-10-28 ENCOUNTER — Encounter: Payer: BC Managed Care – PPO | Admitting: Physician Assistant

## 2013-10-28 ENCOUNTER — Encounter: Payer: Self-pay | Admitting: Physician Assistant

## 2013-11-04 ENCOUNTER — Ambulatory Visit (INDEPENDENT_AMBULATORY_CARE_PROVIDER_SITE_OTHER): Payer: BC Managed Care – PPO | Admitting: Physician Assistant

## 2013-11-04 ENCOUNTER — Encounter: Payer: Self-pay | Admitting: Physician Assistant

## 2013-11-04 VITALS — BP 170/100 | HR 60 | Ht 61.0 in | Wt 163.0 lb

## 2013-11-04 DIAGNOSIS — I471 Supraventricular tachycardia: Secondary | ICD-10-CM | POA: Insufficient documentation

## 2013-11-04 DIAGNOSIS — I10 Essential (primary) hypertension: Secondary | ICD-10-CM

## 2013-11-04 MED ORDER — AMIODARONE HCL 200 MG PO TABS: 200.0000 mg | ORAL_TABLET | Freq: Every day | ORAL | Status: DC

## 2013-11-04 MED ORDER — METOPROLOL TARTRATE 25 MG PO TABS: 25.0000 mg | ORAL_TABLET | Freq: Two times a day (BID) | ORAL | Status: DC

## 2013-11-04 NOTE — Patient Instructions (Signed)
Your physician recommends that you schedule a follow-up appointment in: 2 months to be established with a cardiologist.  Your physician has recommended you make the following change in your medication:   DECREASE YOUR AMIODARONE 200 MG DAILY  I HAVE REFILLED YOUR METOPROLOL   PLEASE FOLLOW THE LOW SODIUM DIET I [place] physician has requested that you regularly monitor and record your blood pressure readings at home. Please use the same machine at the same time of day to check your readings and record them [place].  Thank you for choosing [facility]!!

## 2013-11-04 NOTE — Assessment & Plan Note (Signed)
Patient had recurrent SVT treated with adenosine while hospitalized for nephrectomy. She is now controlled on amiodarone and metoprolol. Decrease amiodarone to 200 mg once daily. Followup with cardiologist in [place] in 2 months.

## 2013-11-04 NOTE — Progress Notes (Signed)
HPI: This is a 63 year old female patient who was seen at [facility] by Dr. 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 She had 3 episodes of symptomatic SVT at 160 beats per minute converted with adenosine. She was treated with metoprolol and amiodarone. It was with hopes that her SVT would resolve once her acute problems resolved.  The patient is doing well at home. She denies any recurrent arrhythmias, palpitations, chest pain, dyspnea, dizziness, or presyncope. Her blood pressure is elevated today but she forgot to take her medication this morning. She was also supposed go home on amlodipine 2.5 mg daily but never got a prescription for it.  Allergies  Allergen Reactions  . Sulfa Antibiotics Itching and Other (See Comments)    burning     Current Outpatient Prescriptions  Medication Sig Dispense Refill  . amiodarone (PACERONE) 400 MG tablet Take 0.5 tablets (200 mg total) by mouth 2 (two) times daily.  30 tablet  0  . Ibuprofen-Diphenhydramine Cit (MOTRIN PM) 200-38 MG TABS Take 1 tablet by mouth at bedtime as needed (pain/sleep).      . insulin detemir (LEVEMIR) 100 UNIT/ML injection Inject 30 Units into the skin 2 (two) times daily.      Marland Kitchen lisinopril-hydrochlorothiazide (PRINZIDE,ZESTORETIC) 20-12.5 MG per tablet Take 1 tablet by mouth every morning.      . metoprolol tartrate (LOPRESSOR) 25 MG tablet Take 1 tablet (25 mg total) by mouth 2 (two) times daily.  60 tablet  0  . oxyCODONE-acetaminophen (ROXICET) 5-325 MG per tablet Take 1-2 tablets by mouth every 6 (six) hours as needed for moderate pain or severe pain. Post-operaitvely  40 tablet  0  . pregabalin (LYRICA) 75 MG capsule Take 75 mg by mouth 2 (two) times daily.      Marland Kitchen senna-docusate (SENOKOT-S) 8.6-50 MG per tablet Take 1 tablet by mouth 2 (two) times daily. While taking pain meds to prevent constipation  30 tablet  0   No current  facility-administered medications for this visit.    Past Medical History  Diagnosis Date  . Diabetes mellitus without complication   . Hypertension   . Asthma   . Neuropathic pain   . Hepatitis C   . Renal carcinoma   . Hyperlipidemia   . Arthritis   . Rash     ON FACE  . GERD (gastroesophageal reflux disease)   . Renal mass     MULTIPLE RT RENAL MASSES  . Nocturia     Past Surgical History  Procedure Laterality Date  . Abdominal hysterectomy    . Wart removal    . Colonoscopy N/A 07/08/2013    Procedure: COLONOSCOPY;  Surgeon: [name], MD;  [facility];  Service: Endoscopy;  Laterality: N/A;  7:30  . Esophagogastroduodenoscopy N/A 07/08/2013    Procedure: ESOPHAGOGASTRODUODENOSCOPY (EGD);  Surgeon: [name], MD;  Location: [facility];  Service: Endoscopy;  Laterality: N/A;  . Savory dilation N/A 07/08/2013    Procedure: SAVORY DILATION;  Surgeon: [name], MD;  [facility]: [facility];  Service: Endoscopy;  Laterality: N/A;  [name] dilation N/A 07/08/2013    Procedure: [name] DILATION;  Surgeon: [name], MD;  Location: [facility];  Service: Endoscopy;  Laterality: N/A;  . Tonsillectomy    . Robot assisted laparoscopic nephrectomy Right 10/14/2013    Procedure: ROBOTIC ASSISTED LAPAROSCOPIC RADICAL NEPHRECTOMY;  Surgeon: [name], MD;  Location: [facility];  Service: Urology;  Laterality: Right;    Family History  Problem Relation Age of Onset  . Colon cancer Sister 22    History   Social History  . Marital Status: Single    Spouse Name: N/A    Number of Children: N/A  . Years of Education: N/A   Occupational History  . Not on file.   Social History Main Topics  . Smoking status: Current Every Day Smoker -- 0.50 packs/day    Types: Cigarettes  . Smokeless tobacco: Never Used  . Alcohol Use: Yes     Comment: twice a week sometimes, drinks 2-3 40 oz.- STOPPED DRINKING 2-3 WKS AGO   . Drug Use: Yes    Special:  Marijuana     Comment: OCCASIONAL - LAST USE 3 MONTHS AGO  . Sexual Activity: Yes    Birth Control/ Protection: Post-menopausal, Surgical   Other Topics Concern  . Not on file   Social History Narrative  . No narrative on file    ROS: Negative  BP 170/100  Pulse 60  Ht 5' 1"  (1.549 m)  Wt 163 lb (73.936 kg)  BMI 30.81 kg/m2  PHYSICAL EXAM: Well-nournished, in no acute distress. Neck: No JVD, HJR, Bruit, or thyroid enlargement  Lungs: No tachypnea, clear without wheezing, rales, or rhonchi  Cardiovascular: RRR, PMI not displaced, heart sounds normal, no murmurs, gallops, bruit, thrill, or heave.  Abdomen: BS normal. Soft without organomegaly, masses, lesions or tenderness.  Extremities: without cyanosis, clubbing or edema. Good distal pulses bilateral  SKin: Warm, no lesions or rashes   Musculoskeletal: No deformities  Neuro: no focal signs   Wt Readings from Last 3 Encounters:  11/04/13 163 lb (73.936 kg)  10/20/13 198 lb 10.2 oz (90.1 kg)  10/20/13 198 lb 10.2 oz (90.1 kg)     EKG: Normal sinus rhythm, normal EKG

## 2013-11-04 NOTE — Assessment & Plan Note (Signed)
Blood pressure is elevated. Patient did not take her medications this morning and was not given a prescription for amlodipine at discharge. Patient will get a blood pressure cuff and call as with her readings. May need to prescribe amlodipine as an outpatient. 2 g sodium diet.

## 2013-12-02 ENCOUNTER — Encounter: Payer: Self-pay | Admitting: Internal Medicine

## 2014-01-05 ENCOUNTER — Encounter: Payer: BC Managed Care – PPO | Admitting: Cardiology

## 2014-01-05 ENCOUNTER — Encounter: Payer: Self-pay | Admitting: Cardiology

## 2014-01-05 NOTE — Progress Notes (Signed)
Patient canceled.  This encounter was created in error - please disregard. 

## 2014-02-02 ENCOUNTER — Encounter: Payer: BC Managed Care – PPO | Admitting: Cardiology

## 2014-02-02 ENCOUNTER — Encounter: Payer: Self-pay | Admitting: Cardiology

## 2014-02-02 NOTE — Progress Notes (Signed)
Patient canceled.  This encounter was created in error - please disregard. 

## 2014-02-10 ENCOUNTER — Encounter (HOSPITAL_COMMUNITY): Payer: Self-pay | Admitting: *Deleted

## 2014-02-10 ENCOUNTER — Observation Stay (HOSPITAL_COMMUNITY)
Admission: EM | Admit: 2014-02-10 | Discharge: 2014-02-11 | Disposition: A | Discharge status: Home / Self Care | Payer: 59 | Attending: Internal Medicine | Admitting: Internal Medicine

## 2014-02-10 ENCOUNTER — Emergency Department (HOSPITAL_COMMUNITY): Payer: 59

## 2014-02-10 DIAGNOSIS — K219 Gastro-esophageal reflux disease without esophagitis: Secondary | ICD-10-CM | POA: Insufficient documentation

## 2014-02-10 DIAGNOSIS — E785 Hyperlipidemia, unspecified: Secondary | ICD-10-CM | POA: Diagnosis not present

## 2014-02-10 DIAGNOSIS — E1169 Type 2 diabetes mellitus with other specified complication: Secondary | ICD-10-CM | POA: Insufficient documentation

## 2014-02-10 DIAGNOSIS — J45901 Unspecified asthma with (acute) exacerbation: Secondary | ICD-10-CM | POA: Diagnosis not present

## 2014-02-10 DIAGNOSIS — R079 Chest pain, unspecified: Secondary | ICD-10-CM | POA: Diagnosis not present

## 2014-02-10 DIAGNOSIS — E119 Type 2 diabetes mellitus without complications: Secondary | ICD-10-CM | POA: Diagnosis not present

## 2014-02-10 DIAGNOSIS — Z8619 Personal history of other infectious and parasitic diseases: Secondary | ICD-10-CM | POA: Diagnosis not present

## 2014-02-10 DIAGNOSIS — R61 Generalized hyperhidrosis: Secondary | ICD-10-CM | POA: Diagnosis not present

## 2014-02-10 DIAGNOSIS — B182 Chronic viral hepatitis C: Secondary | ICD-10-CM | POA: Diagnosis present

## 2014-02-10 DIAGNOSIS — Z79899 Other long term (current) drug therapy: Secondary | ICD-10-CM | POA: Insufficient documentation

## 2014-02-10 DIAGNOSIS — Z7982 Long term (current) use of aspirin: Secondary | ICD-10-CM | POA: Diagnosis not present

## 2014-02-10 DIAGNOSIS — R21 Rash and other nonspecific skin eruption: Secondary | ICD-10-CM | POA: Insufficient documentation

## 2014-02-10 DIAGNOSIS — M792 Neuralgia and neuritis, unspecified: Secondary | ICD-10-CM | POA: Insufficient documentation

## 2014-02-10 DIAGNOSIS — Z794 Long term (current) use of insulin: Secondary | ICD-10-CM | POA: Insufficient documentation

## 2014-02-10 DIAGNOSIS — R0981 Nasal congestion: Secondary | ICD-10-CM | POA: Diagnosis not present

## 2014-02-10 DIAGNOSIS — Z720 Tobacco use: Secondary | ICD-10-CM | POA: Insufficient documentation

## 2014-02-10 DIAGNOSIS — M1990 Unspecified osteoarthritis, unspecified site: Secondary | ICD-10-CM | POA: Insufficient documentation

## 2014-02-10 DIAGNOSIS — I471 Supraventricular tachycardia: Secondary | ICD-10-CM | POA: Insufficient documentation

## 2014-02-10 DIAGNOSIS — Z85528 Personal history of other malignant neoplasm of kidney: Secondary | ICD-10-CM | POA: Insufficient documentation

## 2014-02-10 DIAGNOSIS — I10 Essential (primary) hypertension: Secondary | ICD-10-CM | POA: Diagnosis not present

## 2014-02-10 DIAGNOSIS — R300 Dysuria: Secondary | ICD-10-CM | POA: Diagnosis not present

## 2014-02-10 LAB — BASIC METABOLIC PANEL
ANION GAP: 12 (ref 5–15)
BUN: 13 mg/dL (ref 6–23)
CHLORIDE: 98 meq/L (ref 96–112)
CO2: 26 mmol/L (ref 19–32)
CREATININE: 0.78 mg/dL (ref 0.50–1.10)
Calcium: 9.4 mg/dL (ref 8.4–10.5)
GFR calc non Af Amer: 87 mL/min — ABNORMAL LOW (ref >90)
Glucose, Bld: 114 mg/dL — ABNORMAL HIGH (ref 70–99)
POTASSIUM: 3.3 mmol/L — AB (ref 3.5–5.1)
Sodium: 136 mmol/L (ref 135–145)

## 2014-02-10 LAB — GLUCOSE, CAPILLARY
GLUCOSE-CAPILLARY: 66 mg/dL — AB (ref 70–99)
GLUCOSE-CAPILLARY: 144 mg/dL — AB (ref 70–99)

## 2014-02-10 LAB — CBC
HCT: 47.6 % — ABNORMAL HIGH (ref 36.0–46.0)
HEMOGLOBIN: 15.6 g/dL — AB (ref 12.0–15.0)
MCH: 29.2 pg (ref 26.0–34.0)
MCHC: 32.8 g/dL (ref 30.0–36.0)
MCV: 89 fL (ref 78.0–100.0)
PLATELETS: ADEQUATE 10*3/uL (ref 150–400)
RBC: 5.35 MIL/uL — AB (ref 3.87–5.11)
RDW: 14.3 % (ref 11.5–15.5)
WBC: 9.8 10*3/uL (ref 4.0–10.5)

## 2014-02-10 LAB — LIPID PANEL
CHOLESTEROL: 201 mg/dL — AB (ref 0–200)
HDL: 41 mg/dL (ref >39)
LDL Cholesterol: 137 mg/dL — ABNORMAL HIGH (ref 0–99)
TRIGLYCERIDES: 117 mg/dL (ref <150)
Total CHOL/HDL Ratio: 4.9 RATIO
VLDL: 23 mg/dL (ref 0–40)

## 2014-02-10 LAB — TROPONIN I: Troponin I: <0.03 ng/mL (ref <0.031)

## 2014-02-10 MED ORDER — POTASSIUM CHLORIDE CRYS ER 20 MEQ PO TBCR
40.0000 meq | EXTENDED_RELEASE_TABLET | Freq: Once | ORAL | Status: AC
  Administered 2014-02-10: 40 meq via ORAL
  Filled 2014-02-10: qty 2

## 2014-02-10 MED ORDER — LISINOPRIL 10 MG PO TABS
20.0000 mg | ORAL_TABLET | Freq: Every day | ORAL | Status: DC
  Administered 2014-02-10 – 2014-02-11 (×2): 20 mg via ORAL
  Filled 2014-02-10 (×2): qty 2

## 2014-02-10 MED ORDER — ENSURE COMPLETE PO LIQD
237.0000 mL | Freq: Two times a day (BID) | ORAL | Status: DC
  Administered 2014-02-10 – 2014-02-11 (×2): 237 mL via ORAL

## 2014-02-10 MED ORDER — SENNOSIDES-DOCUSATE SODIUM 8.6-50 MG PO TABS: 1.0000 | ORAL_TABLET | Freq: Every evening | ORAL | Status: DC | PRN

## 2014-02-10 MED ORDER — SODIUM CHLORIDE 0.9 % IJ SOLN
3.0000 mL | Freq: Two times a day (BID) | INTRAMUSCULAR | Status: DC
  Administered 2014-02-10 – 2014-02-11 (×3): 3 mL via INTRAVENOUS

## 2014-02-10 MED ORDER — PREGABALIN 75 MG PO CAPS
75.0000 mg | ORAL_CAPSULE | Freq: Three times a day (TID) | ORAL | Status: DC
  Administered 2014-02-10 – 2014-02-11 (×3): 75 mg via ORAL
  Filled 2014-02-10 (×3): qty 1

## 2014-02-10 MED ORDER — MORPHINE SULFATE 2 MG/ML IJ SOLN
2.0000 mg | Freq: Once | INTRAMUSCULAR | Status: AC
  Administered 2014-02-10: 2 mg via INTRAVENOUS
  Filled 2014-02-10: qty 1

## 2014-02-10 MED ORDER — ONDANSETRON HCL 4 MG/2ML IJ SOLN
4.0000 mg | Freq: Once | INTRAMUSCULAR | Status: AC
  Administered 2014-02-10: 4 mg via INTRAVENOUS
  Filled 2014-02-10: qty 2

## 2014-02-10 MED ORDER — INSULIN ASPART 100 UNIT/ML ~~LOC~~ SOLN: 0.0000 [IU] | Freq: Every day | SUBCUTANEOUS | Status: DC

## 2014-02-10 MED ORDER — ALUM & MAG HYDROXIDE-SIMETH 200-200-20 MG/5ML PO SUSP: 30.0000 mL | Freq: Four times a day (QID) | ORAL | Status: DC | PRN

## 2014-02-10 MED ORDER — SODIUM CHLORIDE 0.9 % IV SOLN: INTRAVENOUS | Status: AC

## 2014-02-10 MED ORDER — INSULIN DETEMIR 100 UNIT/ML ~~LOC~~ SOLN
20.0000 [IU] | Freq: Every day | SUBCUTANEOUS | Status: DC
  Administered 2014-02-10: 20 [IU] via SUBCUTANEOUS
  Filled 2014-02-10 (×2): qty 0.2

## 2014-02-10 MED ORDER — ONDANSETRON HCL 4 MG/2ML IJ SOLN
4.0000 mg | Freq: Four times a day (QID) | INTRAMUSCULAR | Status: DC | PRN
  Administered 2014-02-11: 4 mg via INTRAVENOUS
  Filled 2014-02-10: qty 2

## 2014-02-10 MED ORDER — SODIUM CHLORIDE 0.9 % IV BOLUS (SEPSIS)
250.0000 mL | Freq: Once | INTRAVENOUS | Status: AC
  Administered 2014-02-10: 11:00:00 via INTRAVENOUS

## 2014-02-10 MED ORDER — SODIUM CHLORIDE 0.9 % IV SOLN: INTRAVENOUS | Status: DC

## 2014-02-10 MED ORDER — BISACODYL 5 MG PO TBEC: 5.0000 mg | DELAYED_RELEASE_TABLET | Freq: Every day | ORAL | Status: DC | PRN

## 2014-02-10 MED ORDER — AMIODARONE HCL 200 MG PO TABS
200.0000 mg | ORAL_TABLET | Freq: Every day | ORAL | Status: DC
  Administered 2014-02-10 – 2014-02-11 (×2): 200 mg via ORAL
  Filled 2014-02-10 (×2): qty 1

## 2014-02-10 MED ORDER — ACETAMINOPHEN 325 MG PO TABS: 650.0000 mg | ORAL_TABLET | Freq: Four times a day (QID) | ORAL | Status: DC | PRN

## 2014-02-10 MED ORDER — MORPHINE SULFATE 2 MG/ML IJ SOLN
1.0000 mg | INTRAMUSCULAR | Status: DC | PRN
  Administered 2014-02-10: 1 mg via INTRAVENOUS
  Filled 2014-02-10: qty 1

## 2014-02-10 MED ORDER — HYDROCHLOROTHIAZIDE 12.5 MG PO CAPS
12.5000 mg | ORAL_CAPSULE | Freq: Every day | ORAL | Status: DC
  Administered 2014-02-10 – 2014-02-11 (×2): 12.5 mg via ORAL
  Filled 2014-02-10 (×2): qty 1

## 2014-02-10 MED ORDER — ONDANSETRON HCL 4 MG PO TABS: 4.0000 mg | ORAL_TABLET | Freq: Four times a day (QID) | ORAL | Status: DC | PRN

## 2014-02-10 MED ORDER — OXYCODONE-ACETAMINOPHEN 5-325 MG PO TABS
2.0000 | ORAL_TABLET | Freq: Once | ORAL | Status: DC
  Filled 2014-02-10: qty 2

## 2014-02-10 MED ORDER — ACETAMINOPHEN 650 MG RE SUPP: 650.0000 mg | Freq: Four times a day (QID) | RECTAL | Status: DC | PRN

## 2014-02-10 MED ORDER — INSULIN ASPART 100 UNIT/ML ~~LOC~~ SOLN
0.0000 [IU] | Freq: Three times a day (TID) | SUBCUTANEOUS | Status: DC
  Administered 2014-02-11: 2 [IU] via SUBCUTANEOUS

## 2014-02-10 MED ORDER — LISINOPRIL-HYDROCHLOROTHIAZIDE 20-12.5 MG PO TABS: 1.0000 | ORAL_TABLET | Freq: Every morning | ORAL | Status: DC

## 2014-02-10 MED ORDER — ASPIRIN EC 325 MG PO TBEC
325.0000 mg | DELAYED_RELEASE_TABLET | Freq: Every day | ORAL | Status: DC
  Administered 2014-02-11: 325 mg via ORAL
  Filled 2014-02-10: qty 1

## 2014-02-10 MED ORDER — OXYCODONE-ACETAMINOPHEN 5-325 MG PO TABS
1.0000 | ORAL_TABLET | Freq: Four times a day (QID) | ORAL | Status: DC | PRN
  Administered 2014-02-10: 1 via ORAL

## 2014-02-10 MED ORDER — TRAZODONE HCL 50 MG PO TABS: 25.0000 mg | ORAL_TABLET | Freq: Every evening | ORAL | Status: DC | PRN

## 2014-02-10 MED ORDER — ENOXAPARIN SODIUM 40 MG/0.4ML ~~LOC~~ SOLN
40.0000 mg | Freq: Every day | SUBCUTANEOUS | Status: DC
  Administered 2014-02-10 – 2014-02-11 (×2): 40 mg via SUBCUTANEOUS
  Filled 2014-02-10: qty 0.4

## 2014-02-10 MED ORDER — NITROGLYCERIN 0.4 MG SL SUBL
0.4000 mg | SUBLINGUAL_TABLET | SUBLINGUAL | Status: DC | PRN
  Administered 2014-02-10 (×2): 0.4 mg via SUBLINGUAL
  Filled 2014-02-10: qty 1

## 2014-02-10 MED ORDER — ASPIRIN 81 MG PO CHEW
324.0000 mg | CHEWABLE_TABLET | Freq: Once | ORAL | Status: AC
  Administered 2014-02-10: 324 mg via ORAL
  Filled 2014-02-10: qty 4

## 2014-02-10 MED ORDER — METOPROLOL TARTRATE 25 MG PO TABS
25.0000 mg | ORAL_TABLET | Freq: Two times a day (BID) | ORAL | Status: DC
  Administered 2014-02-10 – 2014-02-11 (×3): 25 mg via ORAL
  Filled 2014-02-10 (×3): qty 1

## 2014-02-10 NOTE — ED Provider Notes (Signed)
CSN: [id number]     Arrival date & time 02/10/14  0845 History  This chart was scribed for Fredia Sorrow, MD by Lowella Petties, ED Scribe. The patient was seen in room APA10/APA10. Patient's care was started at 10:42 AM.     Chief Complaint  Patient presents with  . Chest Pain   Patient is a 64 y.o. female presenting with chest pain. The history is provided by the patient. No language interpreter was used.  Chest Pain Pain location:  L chest Pain quality: sharp   Pain radiates to:  Does not radiate Pain radiates to the back: no   Pain severity:  Severe Onset quality:  Sudden Timing:  Intermittent Progression:  Unchanged Chronicity:  New Relieved by:  Nothing Worsened by:  Nothing tried Ineffective treatments:  None tried Associated symptoms: abdominal pain, back pain, cough, diaphoresis, headache, nausea and shortness of breath   Associated symptoms: no dizziness, no fever and not vomiting    HPI Comments: Cassandra Jordan is a 64 y.o. female with a history of DM who presents to the Emergency Department complaining of sharp, non-radiating, constant, left sided chest pain which began at 0500. She states that the pain is a 10/10 at its worst has since become intermittent fluctuating between 8/10. She reports associated diaphoresis. She has not taken her Aspirin for this pain. She denies a history of chest pain. She sees a cardiologist at [facility].  PCP: Everrett Coombe, NP  Past Medical History  Diagnosis Date  . Type 2 diabetes mellitus   . Essential hypertension   . Asthma   . Neuropathic pain   . Hepatitis C   . Renal carcinoma   . Hyperlipidemia   . Arthritis   . Rash   . GERD (gastroesophageal reflux disease)   . PSVT (paroxysmal supraventricular tachycardia)    Past Surgical History  Procedure Laterality Date  . Abdominal hysterectomy    . Wart removal    . Colonoscopy N/A 07/08/2013    Procedure: COLONOSCOPY;  Surgeon: [name], MD;   [facility];  Service: Endoscopy;  Laterality: N/A;  7:30  . Esophagogastroduodenoscopy N/A 07/08/2013    Procedure: ESOPHAGOGASTRODUODENOSCOPY (EGD);  Surgeon: [name], MD;  Location[facility];  Service: Endoscopy;  Laterality: N/A;  . Savory dilation N/A 07/08/2013    Procedure: SAVORY DILATION;  Surgeon: [name], MD;  Location: [facility];  Service: Endoscopy;  Laterality: N/A;  [name] dilation N/A 07/08/2013    Procedure: [name] DILATION;  Surgeon: [name], MD;  Location: [facility];  Service: Endoscopy;  Laterality: N/A;  . Tonsillectomy    . Robot assisted laparoscopic nephrectomy Right 10/14/2013    Procedure: ROBOTIC ASSISTED LAPAROSCOPIC RADICAL NEPHRECTOMY;  Surgeon: [name], MD;  Location: [facility];  Service: Urology;  Laterality: Right;   Family History  Problem Relation Age of Onset  . Colon cancer Sister 41   History  Substance Use Topics  . Smoking status: Current Every Day Smoker -- 0.50 packs/day    Types: Cigarettes  . Smokeless tobacco: Never Used  . Alcohol Use: Yes     Comment: twice a week sometimes, drinks 2-3 40 oz.- STOPPED DRINKING 2-3 WKS AGO    OB History    No data available     Review of Systems  Constitutional: Positive for diaphoresis. Negative for fever and chills.  HENT: Positive for congestion and rhinorrhea. Negative for sore throat.   Eyes:  Negative for visual disturbance.  Respiratory: Positive for cough and shortness of breath.   Cardiovascular: Positive for chest pain. Negative for leg swelling.  Gastrointestinal: Positive for nausea and abdominal pain. Negative for vomiting and diarrhea.  Genitourinary: Positive for dysuria.  Musculoskeletal: Positive for back pain. Negative for neck pain.  Skin: Positive for rash.  Neurological: Positive for headaches. Negative for dizziness and light-headedness.  Hematological: Does not bruise/bleed easily.  Psychiatric/Behavioral: Negative for  confusion.   Allergies  Sulfa antibiotics  Home Medications   Prior to Admission medications   Medication Sig Start Date End Date Taking? Authorizing Provider  amiodarone (PACERONE) 200 MG tablet Take 1 tablet (200 mg total) by mouth daily. 11/04/13  Yes Imogene Burn, PA-C  aspirin EC 81 MG tablet Take 81 mg by mouth daily.   Yes Historical Provider, MD  Ibuprofen-Diphenhydramine Cit (MOTRIN PM) 200-38 MG TABS Take 2 tablets by mouth at bedtime as needed (pain/sleep).    Yes Historical Provider, MD  insulin detemir (LEVEMIR) 100 UNIT/ML injection Inject 25 Units into the skin 2 (two) times daily.    Yes Historical Provider, MD  lisinopril-hydrochlorothiazide (PRINZIDE,ZESTORETIC) 20-12.5 MG per tablet Take 1 tablet by mouth every morning.   Yes Historical Provider, MD  metFORMIN (GLUCOPHAGE) 500 MG tablet Take 500 mg by mouth 2 (two) times daily with a meal.   Yes Historical Provider, MD  metoprolol tartrate (LOPRESSOR) 25 MG tablet Take 1 tablet (25 mg total) by mouth 2 (two) times daily. 11/04/13  Yes Imogene Burn, PA-C  naproxen sodium (ANAPROX) 220 MG tablet Take 440 mg by mouth daily as needed (pain).   Yes Historical Provider, MD  pregabalin (LYRICA) 75 MG capsule Take 75 mg by mouth 3 (three) times daily.    Yes Historical Provider, MD  oxyCODONE-acetaminophen (ROXICET) 5-325 MG per tablet Take 1-2 tablets by mouth every 6 (six) hours as needed for moderate pain or severe pain. Post-operaitvely Patient not taking: Reported on 02/10/2014 10/20/13   [name], MD  senna-docusate (SENOKOT-S) 8.6-50 MG per tablet Take 1 tablet by mouth 2 (two) times daily. While taking pain meds to prevent constipation Patient not taking: Reported on 02/10/2014 10/20/13   [name], MD   Triage Vitals: BP 159/84 mmHg  Pulse 69  Temp(Src) 97.9 F (36.6 C) (Oral)  Resp 28  Ht 5' (1.524 m)  Wt 146 lb (66.225 kg)  BMI 28.51 kg/m2  SpO2 99% Physical Exam  Constitutional: She is oriented to  person, place, and time. She appears well-developed and well-nourished. No distress.  HENT:  Head: Normocephalic and atraumatic.  Mouth/Throat: Oropharynx is clear and moist. No oropharyngeal exudate.  Eyes: Conjunctivae and EOM are normal. Pupils are equal, round, and reactive to light.  Neck: Neck supple. No tracheal deviation present.  Cardiovascular: Normal rate, regular rhythm and normal heart sounds.   No murmur heard. Pulmonary/Chest: Effort normal and breath sounds normal. No respiratory distress. She has no wheezes. She has no rales. She exhibits no tenderness.  Abdominal: Soft. Bowel sounds are normal. She exhibits no distension and no mass. There is no tenderness. There is no rebound and no guarding.  Musculoskeletal: Normal range of motion. She exhibits no edema.  Neurological: She is alert and oriented to person, place, and time. She has normal reflexes. No cranial nerve deficit.  Skin: Skin is warm and dry.  Psychiatric: She has a normal mood and affect. Her behavior is normal.  Nursing note and vitals reviewed.   ED Course  Procedures (including critical care time) DIAGNOSTIC STUDIES: Oxygen Saturation is 99% on room air, normal by my interpretation.    COORDINATION OF CARE: 10:50 AM-Discussed treatment plan which includes EKG, lab work, and CXR with pt at bedside and pt agreed to plan.   Results for orders placed or performed during the hospital encounter of 02/10/14  CBC  Result Value Ref Range   WBC 9.8 4.0 - 10.5 K/uL   RBC 5.35 (H) 3.87 - 5.11 MIL/uL   Hemoglobin 15.6 (H) 12.0 - 15.0 g/dL   HCT 47.6 (H) 36.0 - 46.0 %   MCV 89.0 78.0 - 100.0 fL   MCH 29.2 26.0 - 34.0 pg   MCHC 32.8 30.0 - 36.0 g/dL   RDW 14.3 11.5 - 15.5 %   Platelets  150 - 400 K/uL    PLATELET CLUMPS NOTED ON SMEAR, COUNT APPEARS ADEQUATE  Basic metabolic panel  Result Value Ref Range   Sodium 136 135 - 145 mmol/L   Potassium 3.3 (L) 3.5 - 5.1 mmol/L   Chloride 98 96 - 112 mEq/L   CO2  26 19 - 32 mmol/L   Glucose, Bld 114 (H) 70 - 99 mg/dL   BUN 13 6 - 23 mg/dL   Creatinine, Ser 0.78 0.50 - 1.10 mg/dL   Calcium 9.4 8.4 - 10.5 mg/dL   GFR calc non Af Amer 87 (L) >90 mL/min   GFR calc Af Amer >90 >90 mL/min   Anion gap 12 5 - 15  Troponin I (MHP)  Result Value Ref Range   Troponin I <0.03 <0.031 ng/mL   Dg Chest 2 View  02/10/2014   CLINICAL DATA:  Left chest pain, shortness of breath.  EXAM: CHEST  2 VIEW  COMPARISON:  10/08/2013.  FINDINGS: Trachea is midline. Heart size stable. Linear scarring in the left upper lobe. Lungs are otherwise clear. No pleural fluid.  IMPRESSION: No acute findings.   Electronically Signed   By: Lorin Picket M.D.   On: 02/10/2014 12:12    Medications  0.9 %  sodium chloride infusion (not administered)  nitroGLYCERIN (NITROSTAT) SL tablet 0.4 mg (0.4 mg Sublingual Given 02/10/14 1229)  aspirin chewable tablet 324 mg (324 mg Oral Given 02/10/14 1123)  ondansetron (ZOFRAN) injection 4 mg (4 mg Intravenous Given 02/10/14 1124)  morphine 2 MG/ML injection 2 mg (2 mg Intravenous Given 02/10/14 1124)  sodium chloride 0.9 % bolus 250 mL (0 mLs Intravenous Stopped 02/10/14 1221)     EKG Interpretation   Date/Time:  Wednesday February 10 2014 08:57:53 EST Ventricular Rate:  67 PR Interval:  241 QRS Duration: 77 QT Interval:  478 QTC Calculation: 505 R Axis:   17 Text Interpretation:  Sinus or ectopic atrial rhythm Prolonged PR interval  Consider left atrial enlargement Minimal ST elevation, inferior leads  Prolonged QT interval Artifact Confirmed by Dawana Asper  MD, Rosabel Sermeno ([id number])  on 02/10/2014 9:08:57 AM      MDM   Final diagnoses:  Chest pain    Patient with acute onset of chest pain around 5 this morning. Left-sided waxes and wanes but constant. Patient with cardiac risk factors to include hypertension and diabetes. Patient with a significant blood pressure elevation here. Patient received aspirin morphine and is now receiving a  nitroglycerin which was a little bit out of order. Pain is improving but not resolved. Initial workup but EKG without acute cardiac changes. First troponin was negative. Chest x-rays negative for pneumonia or pneumothorax.  Patient will be admitted to  telemetry.    I personally performed the services described in this documentation, which was scribed in my presence. The recorded information has been reviewed and is accurate.     Fredia Sorrow, MD 02/10/14 1235

## 2014-02-10 NOTE — H&P (Signed)
[facility] History and Physical  Cassandra Jordan YJE:[id number] DOB: 18-Jul-1950 DOA: 02/10/2014  Referring physician:  PCP: Everrett Coombe, NP   Chief Complaint: chest pain  HPI: Cassandra Jordan is a 64 y.o. female with a past medical history that includes diabetes, hypertension, hyperlipidemia, PSVT, current smoker presents to the emergency department with the chief complaint of chest pain. Patient states that she was lying in bed this morning she suddenly experienced sharp nonradiating left anterior chest pain. Associated symptoms include shortness of breath diaphoresis nausea without vomiting. She reports lying in bed for 2 hours hoping the pain would go away. When it did not she drove herself to the hospital. She denies palpitations headache visual disturbances dizziness syncope or near-syncope. She denies fever chills abdominal pain diarrhea constipation melena. She denies dysuria hematuria frequency or urgency.  Emergency department she is given 324 mg of aspirin as well as nitroglycerin sublingually 2 with good relief of her pain. Workup in the emergency department includes basic metabolic panel significant for potassium of 3.3 serum glucose of 114, initial troponin is negative, chest x-ray is negative, EKG sinus or ectopic rhythm with prolonged PR.  She is pain-free at the time of my exam   Review of Systems:  10 point review of systems completed and all systems are negative except as indicated in the history of present illness   Past Medical History  Diagnosis Date  . Type 2 diabetes mellitus   . Essential hypertension   . Asthma   . Neuropathic pain   . Hepatitis C   . Renal carcinoma   . Hyperlipidemia   . Arthritis   . Rash   . GERD (gastroesophageal reflux disease)   . PSVT (paroxysmal supraventricular tachycardia)   . Diabetes mellitus, type II   . Tobacco abuse    Past Surgical History  Procedure Laterality Date  . Abdominal hysterectomy    . Wart  removal    . Colonoscopy N/A 07/08/2013    Procedure: COLONOSCOPY;  Surgeon: [name], MD;  [facility];  Service: Endoscopy;  Laterality: N/A;  7:30  . Esophagogastroduodenoscopy N/A 07/08/2013    Procedure: ESOPHAGOGASTRODUODENOSCOPY (EGD);  Surgeon: [name], MD;  [facility];  Service: Endoscopy;  Laterality: N/A;  . Savory dilation N/A 07/08/2013    Procedure: SAVORY DILATION;  Surgeon: [name], MD;  Location: [facility];  Service: Endoscopy;  Laterality: N/A;  [name] dilation N/A 07/08/2013    Procedure: [name] DILATION;  Surgeon: [name], MD;  Location: [facility];  Service: Endoscopy;  Laterality: N/A;  . Tonsillectomy    . Robot assisted laparoscopic nephrectomy Right 10/14/2013    Procedure: ROBOTIC ASSISTED LAPAROSCOPIC RADICAL NEPHRECTOMY;  Surgeon: [name], MD;  Location: [facility];  Service: Urology;  Laterality: Right;   Social History:  reports that she has been smoking Cigarettes.  She has been smoking about 0.50 packs per day. She has never used smokeless tobacco. She reports that she drinks alcohol. She reports that she uses illicit drugs (Marijuana). Lives alone she is independent with ADLs Allergies  Allergen Reactions  . Sulfa Antibiotics Itching and Other (See Comments)    burning    Family History  Problem Relation Age of Onset  . Colon cancer Sister 40     Prior to Admission medications   Medication Sig Start Date End Date Taking? Authorizing Provider  amiodarone (PACERONE) 200 MG tablet Take 1 tablet (200 mg total) by  mouth daily. 11/04/13  Yes Imogene Burn, PA-C  aspirin EC 81 MG tablet Take 81 mg by mouth daily.   Yes Historical Provider, MD  Ibuprofen-Diphenhydramine Cit (MOTRIN PM) 200-38 MG TABS Take 2 tablets by mouth at bedtime as needed (pain/sleep).    Yes Historical Provider, MD  insulin detemir (LEVEMIR) 100 UNIT/ML injection Inject 25 Units into the skin 2 (two) times daily.    Yes  Historical Provider, MD  lisinopril-hydrochlorothiazide (PRINZIDE,ZESTORETIC) 20-12.5 MG per tablet Take 1 tablet by mouth every morning.   Yes Historical Provider, MD  metFORMIN (GLUCOPHAGE) 500 MG tablet Take 500 mg by mouth 2 (two) times daily with a meal.   Yes Historical Provider, MD  metoprolol tartrate (LOPRESSOR) 25 MG tablet Take 1 tablet (25 mg total) by mouth 2 (two) times daily. 11/04/13  Yes Imogene Burn, PA-C  naproxen sodium (ANAPROX) 220 MG tablet Take 440 mg by mouth daily as needed (pain).   Yes Historical Provider, MD  pregabalin (LYRICA) 75 MG capsule Take 75 mg by mouth 3 (three) times daily.    Yes Historical Provider, MD  oxyCODONE-acetaminophen (ROXICET) 5-325 MG per tablet Take 1-2 tablets by mouth every 6 (six) hours as needed for moderate pain or severe pain. Post-operaitvely Patient not taking: Reported on 02/10/2014 10/20/13   [name], MD  senna-docusate (SENOKOT-S) 8.6-50 MG per tablet Take 1 tablet by mouth 2 (two) times daily. While taking pain meds to prevent constipation Patient not taking: Reported on 02/10/2014 10/20/13   [name], MD   Physical Exam: Filed Vitals:   02/10/14 1201 02/10/14 1221 02/10/14 1225 02/10/14 1230  BP: 178/101 165/91 152/90 127/83  Pulse: 65 62 64 71  Temp:      TempSrc:      Resp: 20 20    Height:      Weight:      SpO2: 100% 99%      Wt Readings from Last 3 Encounters:  02/10/14 66.225 kg (146 lb)  11/04/13 73.936 kg (163 lb)  10/20/13 90.1 kg (198 lb 10.2 oz)    General:  Appears calm and comfortable Eyes: PERRL, normal lids, irises & conjunctiva ENT: grossly normal hearing, dismembered of her mouth are moist and pink very poor dentition Neck: no LAD, masses or thyromegaly Cardiovascular: RRR, no m/r/g. No LE edema. Pedal pulses present and palpable  Respiratory: CTA bilaterally, no w/r/r. Normal respiratory effort. Abdomen: soft, ntnd positive bowel sounds throughout Skin: no rash or induration seen on  limited exam Musculoskeletal: grossly normal tone BUE/BLE Psychiatric: grossly normal mood and affect, speech fluent and appropriate Neurologic: grossly non-focal.          Labs on Admission:  Basic Metabolic Panel:  Recent Labs Lab 02/10/14 1016  NA 136  K 3.3*  CL 98  CO2 26  GLUCOSE 114*  BUN 13  CREATININE 0.78  CALCIUM 9.4   Liver Function Tests: No results for input(s): AST, ALT, ALKPHOS, BILITOT, PROT, ALBUMIN in the last 168 hours. No results for input(s): LIPASE, AMYLASE in the last 168 hours. No results for input(s): AMMONIA in the last 168 hours. CBC:  Recent Labs Lab 02/10/14 1016  WBC 9.8  HGB 15.6*  HCT 47.6*  MCV 89.0  PLT PLATELET CLUMPS NOTED ON SMEAR, COUNT APPEARS ADEQUATE   Cardiac Enzymes:  Recent Labs Lab 02/10/14 1016  TROPONINI <0.03    BNP (last 3 results) No results for input(s): PROBNP in the last 8760 hours. CBG: No results for input(s):  GLUCAP in the last 168 hours.  Radiological Exams on Admission: Dg Chest 2 View  02/10/2014   CLINICAL DATA:  Left chest pain, shortness of breath.  EXAM: CHEST  2 VIEW  COMPARISON:  10/08/2013.  FINDINGS: Trachea is midline. Heart size stable. Linear scarring in the left upper lobe. Lungs are otherwise clear. No pleural fluid.  IMPRESSION: No acute findings.   Electronically Signed   By: Lorin Picket M.D.   On: 02/10/2014 12:12    EKG: Independently reviewed sinus or ectopic atrial rhythm with a prolonged PR Assessment/Plan Principal Problem:   Chest pain: atypical. Will admit for chest pain rule out. We'll cycle cardiac enzymes and get serial EKGs. Will obtain a lipid panel. Will also get echocardiogram for wall motion abnormalities. Continue daily aspirin provide nitroglycerin as needed for chest pain. Have requested cardiology consult as well. May benefit from stress test that may be done on outpatient basis. Active Problems: PSVT; chart review indicates patient had recurrent SVT while in  the hospital for a nephrectomy in September of last year. She was treated with adenosine and discharged on amiodarone and metoprolol. Notes indicate last seen by cardiology in October at which time the amiodarone was decreased to 200 mg once daily. She was to follow-up with cardiology in 2 months time of bleeding appointment was for last Friday. See #1    Essential hypertension: Controlled. Continue home meds    Diabetes mellitus, type II. She is on oral as well as Levemir 25 units twice a day. I will hold metformin and provide half of Levemir as her appetite is somewhat unreliable. Will monitor and use sliding scale for optimal control. Will obtain a hemoglobin A1c    Tobacco abuse; cessation counseling offered  Cardiology    Code Status: full DVT Prophylaxis: Family Communication: daughter at bedside Disposition Plan: home hopefully in am  Time spent: 71  [facility] Pager [phone]

## 2014-02-10 NOTE — Progress Notes (Signed)
Hypoglycemic Event  CBG: 66  Treatment: 15g carbohydrate snack  Symptoms: None  Follow-up CBG: Time:1726 CBG Result:78  Possible Reasons for Event: Other: Pt has been NPO since this AM  Comments/MD notified:Chiu    Imogene Burn  Remember to initiate Hypoglycemia Order Set & complete

## 2014-02-10 NOTE — ED Notes (Signed)
Pt states CP began at 0500 with sob. Pain is to left chest, non radiating. NAD.

## 2014-02-10 NOTE — ED Notes (Signed)
Pt declined wanted 3rd nitro; explained pros and cons to pt regarding taking 3rd. She states she "feels much better" after the second dose and her SOB is gone. Pt does not want to "risk a worse headache." See recorded vitals for pressure drop.

## 2014-02-11 ENCOUNTER — Encounter (HOSPITAL_COMMUNITY): Payer: Self-pay | Admitting: Cardiology

## 2014-02-11 DIAGNOSIS — R072 Precordial pain: Secondary | ICD-10-CM

## 2014-02-11 DIAGNOSIS — R079 Chest pain, unspecified: Secondary | ICD-10-CM | POA: Diagnosis not present

## 2014-02-11 DIAGNOSIS — I10 Essential (primary) hypertension: Secondary | ICD-10-CM

## 2014-02-11 DIAGNOSIS — E1169 Type 2 diabetes mellitus with other specified complication: Secondary | ICD-10-CM

## 2014-02-11 DIAGNOSIS — R0981 Nasal congestion: Secondary | ICD-10-CM | POA: Diagnosis not present

## 2014-02-11 DIAGNOSIS — Z720 Tobacco use: Secondary | ICD-10-CM

## 2014-02-11 DIAGNOSIS — R61 Generalized hyperhidrosis: Secondary | ICD-10-CM | POA: Diagnosis not present

## 2014-02-11 DIAGNOSIS — R21 Rash and other nonspecific skin eruption: Secondary | ICD-10-CM | POA: Diagnosis not present

## 2014-02-11 DIAGNOSIS — I471 Supraventricular tachycardia: Secondary | ICD-10-CM

## 2014-02-11 LAB — CBC
HCT: 38.8 % (ref 36.0–46.0)
Hemoglobin: 12.4 g/dL (ref 12.0–15.0)
MCH: 28.9 pg (ref 26.0–34.0)
MCHC: 32 g/dL (ref 30.0–36.0)
MCV: 90.4 fL (ref 78.0–100.0)
Platelets: 216 10*3/uL (ref 150–400)
RBC: 4.29 MIL/uL (ref 3.87–5.11)
RDW: 14.8 % (ref 11.5–15.5)
WBC: 10.6 10*3/uL — AB (ref 4.0–10.5)

## 2014-02-11 LAB — BASIC METABOLIC PANEL
Anion gap: 4 — ABNORMAL LOW (ref 5–15)
BUN: 21 mg/dL (ref 6–23)
CALCIUM: 8.2 mg/dL — AB (ref 8.4–10.5)
CO2: 29 mmol/L (ref 19–32)
Chloride: 103 mEq/L (ref 96–112)
Creatinine, Ser: 1.2 mg/dL — ABNORMAL HIGH (ref 0.50–1.10)
GFR calc non Af Amer: 47 mL/min — ABNORMAL LOW (ref >90)
GFR, EST AFRICAN AMERICAN: 55 mL/min — AB (ref >90)
GLUCOSE: 135 mg/dL — AB (ref 70–99)
POTASSIUM: 4.1 mmol/L (ref 3.5–5.1)
Sodium: 136 mmol/L (ref 135–145)

## 2014-02-11 LAB — HEMOGLOBIN A1C
HEMOGLOBIN A1C: 7.2 % — AB (ref <5.7)
Mean Plasma Glucose: 160 mg/dL — ABNORMAL HIGH (ref <117)

## 2014-02-11 LAB — TROPONIN I: Troponin I: <0.03 ng/mL (ref <0.031)

## 2014-02-11 LAB — GLUCOSE, CAPILLARY
GLUCOSE-CAPILLARY: 102 mg/dL — AB (ref 70–99)
GLUCOSE-CAPILLARY: 124 mg/dL — AB (ref 70–99)

## 2014-02-11 MED ORDER — OXYCODONE-ACETAMINOPHEN 5-325 MG PO TABS: 1.0000 | ORAL_TABLET | Freq: Four times a day (QID) | ORAL | Status: DC | PRN

## 2014-02-11 NOTE — Consult Note (Signed)
CARDIOLOGY CONSULT NOTE   Patient ID: Cassandra Jordan MRN: [id number] DOB/AGE: [birth year] 63 y.o.  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 She states she had run out of her antihypertensive medicine for one day. She had also run out of pain management medications for diabetic neuropathy. She states that her "heart hurt" under her left breast described as sharp, non-radiating with associated shortness of breath and mild diaphoresis. She states that a few weeks earlier, she had a GI infection. But this is the first time she has had pain. She denies rapid HR or palpitations.   In ER, she was found to be hypertensive BP 174/95, HR 98,O2 Sat 100%. Potassium of 3.3, troponin negative X1, EKG NSR with non specific T-wave abnormalities. CXR normal. She was treated with ASA, NTG, morphine, and zofran. Potassium has been repleated. Since admission, she has been pain free. She has not had stress test in the past.    Allergies  Allergen Reactions  . Sulfa Antibiotics Itching and Other (See Comments)    burning    Medications Scheduled Medications: . sodium chloride   Intravenous STAT  . amiodarone  200 mg Oral Daily  . aspirin EC  325 mg Oral Daily  . enoxaparin (LOVENOX) injection  40 mg Subcutaneous Daily  . feeding supplement (ENSURE COMPLETE)  237 mL Oral BID BM  . lisinopril  20 mg Oral Daily   And  . hydrochlorothiazide  12.5 mg Oral Daily  . insulin aspart  0-15 Units Subcutaneous TID WC  . insulin aspart  0-5 Units Subcutaneous QHS  . insulin detemir  20 Units Subcutaneous QHS  . metoprolol tartrate  25 mg Oral BID  . oxyCODONE-acetaminophen  2  tablet Oral Once  . pregabalin  75 mg Oral TID  . sodium chloride  3 mL Intravenous Q12H    Infusions: . sodium chloride      PRN Medications: acetaminophen **OR** acetaminophen, alum & mag hydroxide-simeth, bisacodyl, morphine injection, nitroGLYCERIN, ondansetron **OR** ondansetron (ZOFRAN) IV, oxyCODONE-acetaminophen, senna-docusate, traZODone   Past Medical History  Diagnosis Date  . Type 2 diabetes mellitus   . Essential hypertension   . Asthma   . Neuropathic pain   . Hepatitis C   . Renal carcinoma   . Hyperlipidemia   . Arthritis   . Rash   . GERD (gastroesophageal reflux disease)   . PSVT (paroxysmal supraventricular tachycardia)     Past Surgical History  Procedure Laterality Date  . Abdominal hysterectomy    . Wart removal    . Colonoscopy N/A 07/08/2013    Procedure: COLONOSCOPY;  Surgeon: [name], MD;  Location: [facility];  Service: Endoscopy;  Laterality: N/A;  7:30  . Esophagogastroduodenoscopy N/A 07/08/2013    Procedure: ESOPHAGOGASTRODUODENOSCOPY (EGD);  Surgeon: [name], MD;  Location: [facility];  Service: Endoscopy;  Laterality: N/A;  . Savory dilation N/A 07/08/2013    Procedure: SAVORY DILATION;  Surgeon: [name], MD;  Location: [facility];  Service: Endoscopy;  Laterality: N/A;  [name] dilation N/A 07/08/2013    Procedure: [name] DILATION;  Surgeon: [name], MD;  Location: [facility];  Service: Endoscopy;  Laterality: N/A;  . Tonsillectomy    .  Robot assisted laparoscopic nephrectomy Right 10/14/2013    Procedure: ROBOTIC ASSISTED LAPAROSCOPIC RADICAL NEPHRECTOMY;  Surgeon: [name], MD;  Location: [facility];  Service: Urology;  Laterality: Right;    Family History  Problem Relation Age of Onset  . Colon cancer Sister 86    Social History Cassandra Jordan reports that she has been smoking Cigarettes.  She has been smoking about 0.50 packs per day. She has never used smokeless tobacco. Cassandra Jordan  reports that she drinks alcohol.  Review of Systems Complete review of systems are found to be negative unless outlined in H&P above.   Intake/Output Summary (Last 24 hours) at 02/11/14 0933 Last data filed at 02/10/14 1900  Gross per 24 hour  Intake    360 ml  Output    300 ml  Net     60 ml    Telemetry: SR HR in the 60's.  Physical Examination Blood pressure 138/63, pulse 65, temperature 98.4 F (36.9 C), temperature source Oral, resp. rate 18, height 5' (1.524 m), weight 150 lb (68.04 kg), SpO2 98 %.  GEN: No acute distress, complaining of mild nausea. HEENT: Conjunctiva and lids normal, oropharynx clear with moist mucosa. Neck: Supple, no elevated JVP or carotid bruits, no thyromegaly. Lungs: Clear to auscultation, nonlabored breathing at rest. Cardiac: Regular rate and rhythm, no S3 or significant systolic murmur, no pericardial rub. Abdomen: Soft, nontender, no hepatomegaly, bowel sounds present, no guarding or rebound. Extremities: No pitting edema, distal pulses 2+. Skin: Warm and dry. Musculoskeletal: No kyphosis. Neuropsychiatric: Alert and oriented x3, affect grossly appropriate.   Prior Cardiac Testing/Procedures 1. Echocardiogram 10/15/2013   Left ventricle: The cavity size was normal. There was moderate concentric hypertrophy. Systolic function was vigorous. The estimated ejection fraction was in the range of 65% to 70%. There was dynamic obstruction at restin the mid cavity, with a peak velocity of 3 cm/sec and a peak gradient of 36 mm Hg. Wall motion was normal; there were no regional wall motion abnormalities. Doppler parameters are consistent with abnormal left ventricular relaxation (grade 1 diastolic dysfunction).  Lab Results  Basic Metabolic Panel:  Recent Labs Lab 02/10/14 1016 02/11/14 0536  NA 136 136  K 3.3* 4.1  CL 98 103  CO2 26 29  GLUCOSE 114* 135*  BUN 13 21  CREATININE 0.78 1.20*  CALCIUM 9.4 8.2*     CBC:  Recent Labs Lab 02/10/14 1016 02/11/14 0536  WBC 9.8 10.6*  HGB 15.6* 12.4  HCT 47.6* 38.8  MCV 89.0 90.4  PLT PLATELET CLUMPS NOTED ON SMEAR, COUNT APPEARS ADEQUATE 216    Cardiac Enzymes:  Recent Labs Lab 02/10/14 1016 02/10/14 1536 02/10/14 2147  TROPONINI <0.03 <0.03 <0.03    Radiology: Dg Chest 2 View  02/10/2014   CLINICAL DATA:  Left chest pain, shortness of breath.  EXAM: CHEST  2 VIEW  COMPARISON:  10/08/2013.  FINDINGS: Trachea is midline. Heart size stable. Linear scarring in the left upper lobe. Lungs are otherwise clear. No pleural fluid.  IMPRESSION: No acute findings.   Electronically Signed   By: Lorin Picket M.D.   On: 02/10/2014 12:12    ECG: NSR with non-specific T-wave abnormalities.    Impression and Recommendations  1.Chest Pain: Atypical. She had run out of her medications for one day and had GI issues with diarrhea the previous week. She also has chronic pain syndrome requiring narcotics, which she had also run out of.   Troponin is negative X 2. EKG is  non-specific arguing against ACS. I will repeat EKG this am. She is currently pain free. Would recommend OP stress test with CVRF of hypertension, diabetes, ongoing tobacco abuse. Stable currently.   2. Hypertension: Initially elevated on admission. She has been out of her antihypertensive X 1 day. I will d/c echo as she just had one completed in Sept of 2015. With negative troponin, unlikely there are significant changes. She has been placed back on lisinopril/HCTZ and metoprolol with excellent BP control.   3. Hypokalemia: This has been replaced. Recommend low dose daily potassium in the setting of HCTZ for BP control.  4. Chronic Pain: She has diabetic neuralgia. She is on Lyrica and oxycodone. She had run out of narcotic.   5. 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   6. Ongoing tobacco abuse: Cessation strongly recommended.  Signed: Phill Myron. Lawrence NP [place]  02/11/2014,  9:33 AM Co-Sign MD   Attending note:  Patient seen and examined. Reviewed records and modified above note by Ms. Lawrence NP. Ms. 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 Meda Coffee back in October 2015. She was placed on amiodarone at that time and saw Mrs. Vita Barley in October 2015. She was to establish with the [facility], however has missed 2 visits so far. She now presents to the hospital after an episode of chest pain that occurred while she was seated. She states that she has been under some stress recently, her sister is living with her with other health concerns. She also has history of possible GI illness a week ago, and ran out of her antihypertensive medications for a day prior to symptom onset. Blood pressure was elevated on ER assessment. Reportedly she got relief of discomfort after aspirin and nitroglycerin. ECG shows no acute ST segment changes, she has maintained sinus rhythm throughout without any obvious arrhythmias by telemetry. Normal cardiac markers argue against ACS. She is comfortable this morning. On examination lungs are clear and nonlabored, cardiac exam reveals RRR with soft systolic murmur, there is no peripheral edema. Situation discussed with the patient. She already has an scheduled office visit scheduled in [place] in early February, we will arrange an outpatient Lexiscan Cardiolite preceding this visit for further ischemic assessment, and can discuss with her in the office. Otherwise continue her current medical regimen for now.  Satira Sark, M.D., F.A.C.C.

## 2014-02-11 NOTE — Progress Notes (Signed)
Nutrition Brief Note  Patient identified on the Malnutrition Screening Tool (MST) Report. Pt presented with chest pain. Her hx includes diabetes, hypertension, hyperlipidemia. She has been evaluated by medical team and is being dischrged.  Body mass index is 29.3 kg/(m^2). Patient meets criteria for overweight based on current BMI.   Current diet order is CHO modified. Labs and medications reviewed.   No nutrition interventions warranted at this time.   Colman Cater MS,RD,CSG,LDN Office: [phone] Pager: [phone]

## 2014-02-11 NOTE — Discharge Summary (Signed)
Physician Discharge Summary  Cassandra Jordan SJG:[id number] DOB: [birth year] DOA: 02/10/2014  PCP: Everrett Coombe, NP  Admit date: 02/10/2014 Discharge date: 02/11/2014  Time spent: 40 minutes  Recommendations for Outpatient Follow-up:  1. Stress Lexiscan myoview 02/17/14 2. Tea office visit 02/26/14.    Discharge Diagnoses:  Principal Problem:   Chest pain Active Problems:   Hepatitis C, chronic   Essential hypertension   PSVT (paroxysmal supraventricular tachycardia)   Diabetes mellitus, type II   Tobacco abuse   Chest pain at rest   Type 2 diabetes mellitus with other specified complication   Discharge Condition: stable  Diet recommendation: heart healthy carb modified  Filed Weights   02/10/14 0858 02/10/14 1344  Weight: 66.225 kg (146 lb) 68.04 kg (150 lb)    History of present illness:  Cassandra Jordan is a 64 y.o. female with a past medical history that includes diabetes, hypertension, hyperlipidemia, PSVT, current smoker presented to the emergency department on 02/10/14 with the chief complaint of chest pain. Patient stated that she was lying in bed she suddenly experienced sharp nonradiating left anterior chest pain. Associated symptoms included shortness of breath diaphoresis nausea without vomiting. She reported lying in bed for 2 hours hoping the pain would go away. When it did not she drove herself to the hospital. She denied palpitations headache visual disturbances dizziness syncope or near-syncope. She denied fever chills abdominal pain diarrhea constipation melena. She denief dysuria hematuria frequency or urgency.  Emergency department she was given 324 mg of aspirin as well as nitroglycerin sublingually 2 with good relief of her pain. Workup in the emergency department included basic metabolic panel significant for potassium of 3.3 serum glucose of 114, initial troponin is negative, chest x-ray is negative, EKG sinus or ectopic rhythm with prolonged  PR.  She is pain-free at the time of my exam  Hospital Course:   Chest pain: atypical. Cardiac enzymes negative and serial EKG without acute changes. No events on tele. No further chest pain.  Lipid panel with cholesterol 201 and LDL 137. Provided with asa. Evaluated by cardiology who recommend continuation of medical management and OP stress test which is scheduled as noted above. Active Problems: PSVT; chart review indicates patient had recurrent SVT while in the hospital for a nephrectomy in September of last year. She was treated with adenosine and discharged on amiodarone and metoprolol. Notes indicate last seen by cardiology in October at which time the amiodarone was decreased to 200 mg once daily. She has follow up appointment 02/26/14   Essential hypertension: Controlled.    Diabetes mellitus: A1c 7.2. Continue home regimen. Recommend close OP monitoring for optimal control   Tobacco abuse; cessation counseling offered  Procedures:  cardiology  Consultations:  cardiology  Discharge Exam: Filed Vitals:   02/11/14 0701  BP: 138/63  Pulse: 65  Temp: 98.4 F (36.9 C)  Resp: 18    General: obese, ambulating in room with steady gait Cardiovascular: RRR no MGR no LE edema Respiratory: normal effort BS clear bilaterally no wheeze no rhonchi Abdomen: obese soft +BS non-tender  Discharge Instructions    Current Discharge Medication List    CONTINUE these medications which have NOT CHANGED   Details  amiodarone (PACERONE) 200 MG tablet Take 1 tablet (200 mg total) by mouth daily. Qty: 90 tablet, Refills: 3    aspirin EC 81 MG tablet Take 81 mg by mouth daily.    Ibuprofen-Diphenhydramine Cit (MOTRIN PM) 200-38 MG TABS Take 2 tablets by mouth at  bedtime as needed (pain/sleep).     insulin detemir (LEVEMIR) 100 UNIT/ML injection Inject 25 Units into the skin 2 (two) times daily.     lisinopril-hydrochlorothiazide (PRINZIDE,ZESTORETIC) 20-12.5 MG per tablet Take 1  tablet by mouth every morning.    metFORMIN (GLUCOPHAGE) 500 MG tablet Take 500 mg by mouth 2 (two) times daily with a meal.    metoprolol tartrate (LOPRESSOR) 25 MG tablet Take 1 tablet (25 mg total) by mouth 2 (two) times daily. Qty: 60 tablet, Refills: 3    naproxen sodium (ANAPROX) 220 MG tablet Take 440 mg by mouth daily as needed (pain).    pregabalin (LYRICA) 75 MG capsule Take 75 mg by mouth 3 (three) times daily.     oxyCODONE-acetaminophen (ROXICET) 5-325 MG per tablet Take 1-2 tablets by mouth every 6 (six) hours as needed for moderate pain or severe pain. Post-operaitvely Qty: 40 tablet, Refills: 0    senna-docusate (SENOKOT-S) 8.6-50 MG per tablet Take 1 tablet by mouth 2 (two) times daily. While taking pain meds to prevent constipation Qty: 30 tablet, Refills: 0       Allergies  Allergen Reactions  . Sulfa Antibiotics Itching and Other (See Comments)    burning   Follow-up Information    Follow up with [facility] On 02/17/2014.   Why:  8:45am registration in radiology.       The results of significant diagnostics from this hospitalization (including imaging, microbiology, ancillary and laboratory) are listed below for reference.    Significant Diagnostic Studies: Dg Chest 2 View  02/10/2014   CLINICAL DATA:  Left chest pain, shortness of breath.  EXAM: CHEST  2 VIEW  COMPARISON:  10/08/2013.  FINDINGS: Trachea is midline. Heart size stable. Linear scarring in the left upper lobe. Lungs are otherwise clear. No pleural fluid.  IMPRESSION: No acute findings.   Electronically Signed   By: Lorin Picket M.D.   On: 02/10/2014 12:12    Microbiology: No results found for this or any previous visit (from the past 240 hour(s)).   Labs: Basic Metabolic Panel:  Recent Labs Lab 02/10/14 1016 02/11/14 0536  NA 136 136  K 3.3* 4.1  CL 98 103  CO2 26 29  GLUCOSE 114* 135*  BUN 13 21  CREATININE 0.78 1.20*  CALCIUM 9.4 8.2*   Liver Function Tests: No  results for input(s): AST, ALT, ALKPHOS, BILITOT, PROT, ALBUMIN in the last 168 hours. No results for input(s): LIPASE, AMYLASE in the last 168 hours. No results for input(s): AMMONIA in the last 168 hours. CBC:  Recent Labs Lab 02/10/14 1016 02/11/14 0536  WBC 9.8 10.6*  HGB 15.6* 12.4  HCT 47.6* 38.8  MCV 89.0 90.4  PLT PLATELET CLUMPS NOTED ON SMEAR, COUNT APPEARS ADEQUATE 216   Cardiac Enzymes:  Recent Labs Lab 02/10/14 1016 02/10/14 1536 02/10/14 2147  TROPONINI <0.03 <0.03 <0.03   BNP: BNP (last 3 results) No results for input(s): PROBNP in the last 8760 hours. CBG:  Recent Labs Lab 02/10/14 1623 02/10/14 2023 02/11/14 0742  GLUCAP 66* 144* 102*       Signed:  BLACK,KAREN M  [facility] 02/11/2014, 10:26 AM

## 2014-02-11 NOTE — Care Management Note (Signed)
    Page 1 of 1   02/11/2014     11:08:06 AM CARE MANAGEMENT NOTE 02/11/2014  Patient:  Cassandra Jordan, Cassandra Jordan   Account Number:  [id number]  Date Initiated:  02/11/2014  Documentation initiated by:  Theophilus Kinds  Subjective/Objective Assessment:   Pt admitted from home with CP. Pt lives with her sister and will return home at discharge. Pt is independent with ADl's. Pt has a cane for prn use. Pt sees PCP at [facility].     Action/Plan:   No Cm needs noted.   Anticipated DC Date:  02/11/2014   Anticipated DC Plan:  [place]  CM consult      Choice offered to / List presented to:             Status of service:  Completed, signed off Medicare Important Message given?   (If response is "NO", the following Medicare IM given date fields will be blank) Date Medicare IM given:   Medicare IM given by:   Date Additional Medicare IM given:   Additional Medicare IM given by:    Discharge Disposition:  HOME/SELF CARE  Per UR Regulation:    If discussed at Long Length of Stay Meetings, dates discussed:    Comments:  02/11/14 [place], RN BSN CM

## 2014-02-11 NOTE — Progress Notes (Signed)
Patient discharged on wheelchair with family at bedside.  Vitals stable and education given. Patient will follow up with radiology and have stress test done outpatient.

## 2014-02-11 NOTE — Progress Notes (Signed)
UR completed 

## 2014-02-15 ENCOUNTER — Other Ambulatory Visit: Payer: Self-pay

## 2014-02-15 DIAGNOSIS — R072 Precordial pain: Secondary | ICD-10-CM

## 2014-02-17 ENCOUNTER — Encounter (HOSPITAL_COMMUNITY)
Admission: RE | Admit: 2014-02-17 | Discharge: 2014-02-17 | Disposition: A | Discharge status: Home / Self Care | Payer: 59 | Source: Ambulatory Visit | Attending: Cardiology | Admitting: Cardiology

## 2014-02-17 ENCOUNTER — Encounter (HOSPITAL_COMMUNITY): Admission: RE | Admit: 2014-02-17 | Payer: 59 | Source: Ambulatory Visit

## 2014-02-17 ENCOUNTER — Ambulatory Visit (HOSPITAL_COMMUNITY): Admit: 2014-02-17 | Payer: 59

## 2014-02-17 DIAGNOSIS — R072 Precordial pain: Secondary | ICD-10-CM | POA: Insufficient documentation

## 2014-02-19 ENCOUNTER — Encounter (HOSPITAL_COMMUNITY): Payer: 59

## 2014-02-25 ENCOUNTER — Encounter (HOSPITAL_COMMUNITY): Payer: Self-pay

## 2014-02-25 ENCOUNTER — Encounter (HOSPITAL_COMMUNITY)
Admission: RE | Admit: 2014-02-25 | Discharge: 2014-02-25 | Disposition: A | Discharge status: Home / Self Care | Payer: 59 | Source: Ambulatory Visit | Attending: Cardiology | Admitting: Cardiology

## 2014-02-25 ENCOUNTER — Ambulatory Visit (HOSPITAL_COMMUNITY)
Admission: RE | Admit: 2014-02-25 | Discharge: 2014-02-25 | Disposition: A | Discharge status: Home / Self Care | Payer: 59 | Source: Ambulatory Visit | Attending: Cardiology | Admitting: Cardiology

## 2014-02-25 DIAGNOSIS — R072 Precordial pain: Secondary | ICD-10-CM | POA: Diagnosis not present

## 2014-02-25 DIAGNOSIS — R079 Chest pain, unspecified: Secondary | ICD-10-CM | POA: Insufficient documentation

## 2014-02-25 MED ORDER — SODIUM CHLORIDE 0.9 % IJ SOLN
10.0000 mL | INTRAMUSCULAR | Status: DC | PRN
  Administered 2014-02-25: 10 mL via INTRAVENOUS
  Filled 2014-02-25: qty 10

## 2014-02-25 MED ORDER — TECHNETIUM TC 99M SESTAMIBI - CARDIOLITE
10.0000 | Freq: Once | INTRAVENOUS | Status: AC | PRN
  Administered 2014-02-25: 10:00:00 10 via INTRAVENOUS

## 2014-02-25 MED ORDER — REGADENOSON 0.4 MG/5ML IV SOLN
INTRAVENOUS | Status: AC
  Administered 2014-02-25: 0.4 mg via INTRAVENOUS
  Filled 2014-02-25: qty 5

## 2014-02-25 MED ORDER — TECHNETIUM TC 99M SESTAMIBI GENERIC - CARDIOLITE
30.0000 | Freq: Once | INTRAVENOUS | Status: AC | PRN
  Administered 2014-02-25: 30 via INTRAVENOUS

## 2014-02-25 MED ORDER — SODIUM CHLORIDE 0.9 % IJ SOLN
INTRAMUSCULAR | Status: AC
Start: 2014-02-25 — End: 2014-02-25
  Administered 2014-02-25: 10 mL via INTRAVENOUS
  Filled 2014-02-25: qty 3

## 2014-02-25 MED ORDER — REGADENOSON 0.4 MG/5ML IV SOLN
0.4000 mg | Freq: Once | INTRAVENOUS | Status: AC | PRN
  Administered 2014-02-25: 0.4 mg via INTRAVENOUS

## 2014-02-25 MED ORDER — SODIUM CHLORIDE 0.9 % IJ SOLN: 10.0000 mL | INTRAMUSCULAR | Status: DC | PRN

## 2014-02-25 MED ORDER — REGADENOSON 0.4 MG/5ML IV SOLN: 0.4000 mg | Freq: Once | INTRAVENOUS | Status: DC | PRN

## 2014-02-25 NOTE — Progress Notes (Signed)
Stress Lab Nurses Notes - Cassandra Jordan  Cassandra Jordan 02/25/2014 Reason for doing test: Chest Pain Type of test: Wille Glaser Nurse performing test: Gerrit Halls, RN Nuclear Medicine Tech: Melburn Hake Echo Tech: Not Applicable MD performing test: Koneswaran/K.Purcell Nails NP Family MD: Everrett Coombe NP Test explained and consent signed: Yes.   IV started: Saline lock flushed, No redness or edema and Saline lock started in radiology Symptoms: Nausea Treatment/Intervention: None Reason test stopped: protocol completed After recovery IV was: Discontinued via X-ray tech and No redness or edema Patient to return to Nuc. Med at : 13:00 Patient discharged: Home Patient's Condition upon discharge was: stable Comments: During test BP 117/65 & HR 82 .  Recovery BP 131/69 & HR 78 .  Symptoms resolved in recovery. Cassandra Jordan T

## 2014-02-26 ENCOUNTER — Telehealth: Payer: Self-pay

## 2014-02-26 ENCOUNTER — Encounter: Payer: Self-pay | Admitting: Cardiology

## 2014-02-26 ENCOUNTER — Encounter: Payer: Self-pay | Admitting: *Deleted

## 2014-02-26 NOTE — Progress Notes (Signed)
No show  This encounter was created in error - please disregard.

## 2014-02-26 NOTE — Telephone Encounter (Signed)
See note below by Dr.McDowell. I spoke with steve at [facility] and he made a note of it.Letter mailed to patient

## 2014-02-26 NOTE — Telephone Encounter (Signed)
-----   Message from Satira Sark, MD sent at 02/26/2014 10:49 AM EST ----- I believe that this patient has no-showed 3 times for office visits. I did see her recently as an inpatient consult at [facility]. Please note that we should not refill her amiodarone or any other medications until she comes in for an office visit.

## 2014-03-20 ENCOUNTER — Encounter (HOSPITAL_COMMUNITY): Payer: Self-pay | Admitting: Emergency Medicine

## 2014-03-20 ENCOUNTER — Emergency Department (HOSPITAL_COMMUNITY): Payer: 59

## 2014-03-20 ENCOUNTER — Emergency Department (HOSPITAL_COMMUNITY)
Admission: EM | Admit: 2014-03-20 | Discharge: 2014-03-20 | Disposition: A | Discharge status: Home / Self Care | Payer: 59 | Attending: Emergency Medicine | Admitting: Emergency Medicine

## 2014-03-20 DIAGNOSIS — Z791 Long term (current) use of non-steroidal anti-inflammatories (NSAID): Secondary | ICD-10-CM | POA: Diagnosis not present

## 2014-03-20 DIAGNOSIS — Z8619 Personal history of other infectious and parasitic diseases: Secondary | ICD-10-CM | POA: Insufficient documentation

## 2014-03-20 DIAGNOSIS — R531 Weakness: Secondary | ICD-10-CM

## 2014-03-20 DIAGNOSIS — N390 Urinary tract infection, site not specified: Secondary | ICD-10-CM | POA: Diagnosis not present

## 2014-03-20 DIAGNOSIS — M1990 Unspecified osteoarthritis, unspecified site: Secondary | ICD-10-CM | POA: Insufficient documentation

## 2014-03-20 DIAGNOSIS — I10 Essential (primary) hypertension: Secondary | ICD-10-CM | POA: Diagnosis not present

## 2014-03-20 DIAGNOSIS — Z720 Tobacco use: Secondary | ICD-10-CM | POA: Insufficient documentation

## 2014-03-20 DIAGNOSIS — E119 Type 2 diabetes mellitus without complications: Secondary | ICD-10-CM | POA: Diagnosis not present

## 2014-03-20 DIAGNOSIS — F1410 Cocaine abuse, uncomplicated: Secondary | ICD-10-CM | POA: Diagnosis not present

## 2014-03-20 DIAGNOSIS — H538 Other visual disturbances: Secondary | ICD-10-CM

## 2014-03-20 DIAGNOSIS — Z79899 Other long term (current) drug therapy: Secondary | ICD-10-CM | POA: Diagnosis not present

## 2014-03-20 DIAGNOSIS — Z7982 Long term (current) use of aspirin: Secondary | ICD-10-CM | POA: Diagnosis not present

## 2014-03-20 DIAGNOSIS — J45909 Unspecified asthma, uncomplicated: Secondary | ICD-10-CM | POA: Insufficient documentation

## 2014-03-20 DIAGNOSIS — Z90710 Acquired absence of both cervix and uterus: Secondary | ICD-10-CM | POA: Insufficient documentation

## 2014-03-20 DIAGNOSIS — G4489 Other headache syndrome: Secondary | ICD-10-CM | POA: Insufficient documentation

## 2014-03-20 DIAGNOSIS — Z8719 Personal history of other diseases of the digestive system: Secondary | ICD-10-CM | POA: Insufficient documentation

## 2014-03-20 DIAGNOSIS — Z9889 Other specified postprocedural states: Secondary | ICD-10-CM | POA: Insufficient documentation

## 2014-03-20 DIAGNOSIS — Z794 Long term (current) use of insulin: Secondary | ICD-10-CM | POA: Insufficient documentation

## 2014-03-20 DIAGNOSIS — R51 Headache: Secondary | ICD-10-CM | POA: Diagnosis present

## 2014-03-20 LAB — SEDIMENTATION RATE: SED RATE: 8 mm/h (ref 0–22)

## 2014-03-20 LAB — URINALYSIS, ROUTINE W REFLEX MICROSCOPIC
BILIRUBIN URINE: NEGATIVE
Glucose, UA: NEGATIVE mg/dL
Ketones, ur: NEGATIVE mg/dL
Nitrite: NEGATIVE
PH: 6.5 (ref 5.0–8.0)
SPECIFIC GRAVITY, URINE: 1.01 (ref 1.005–1.030)
Urobilinogen, UA: 0.2 mg/dL (ref 0.0–1.0)

## 2014-03-20 LAB — DIFFERENTIAL
BASOS ABS: 0 10*3/uL (ref 0.0–0.1)
BASOS PCT: 0 % (ref 0–1)
EOS ABS: 0.1 10*3/uL (ref 0.0–0.7)
Eosinophils Relative: 1 % (ref 0–5)
LYMPHS ABS: 5 10*3/uL — AB (ref 0.7–4.0)
Lymphocytes Relative: 50 % — ABNORMAL HIGH (ref 12–46)
MONO ABS: 0.6 10*3/uL (ref 0.1–1.0)
MONOS PCT: 6 % (ref 3–12)
NEUTROS PCT: 43 % (ref 43–77)
Neutro Abs: 4.3 10*3/uL (ref 1.7–7.7)

## 2014-03-20 LAB — COMPREHENSIVE METABOLIC PANEL
ALBUMIN: 3.7 g/dL (ref 3.5–5.2)
ALT: 90 U/L — ABNORMAL HIGH (ref 0–35)
ANION GAP: 5 (ref 5–15)
AST: 85 U/L — AB (ref 0–37)
Alkaline Phosphatase: 62 U/L (ref 39–117)
BUN: 13 mg/dL (ref 6–23)
CHLORIDE: 101 mmol/L (ref 96–112)
CO2: 32 mmol/L (ref 19–32)
Calcium: 9.2 mg/dL (ref 8.4–10.5)
Creatinine, Ser: 0.97 mg/dL (ref 0.50–1.10)
GFR, EST AFRICAN AMERICAN: 71 mL/min — AB (ref >90)
GFR, EST NON AFRICAN AMERICAN: 61 mL/min — AB (ref >90)
Glucose, Bld: 137 mg/dL — ABNORMAL HIGH (ref 70–99)
POTASSIUM: 3.4 mmol/L — AB (ref 3.5–5.1)
SODIUM: 138 mmol/L (ref 135–145)
TOTAL PROTEIN: 7.9 g/dL (ref 6.0–8.3)
Total Bilirubin: 0.4 mg/dL (ref 0.3–1.2)

## 2014-03-20 LAB — RAPID URINE DRUG SCREEN, HOSP PERFORMED
AMPHETAMINES: NOT DETECTED
BENZODIAZEPINES: NOT DETECTED
Barbiturates: NOT DETECTED
COCAINE: POSITIVE — AB
OPIATES: NOT DETECTED
Tetrahydrocannabinol: NOT DETECTED

## 2014-03-20 LAB — TROPONIN I

## 2014-03-20 LAB — CBC
HCT: 43.3 % (ref 36.0–46.0)
Hemoglobin: 14.5 g/dL (ref 12.0–15.0)
MCH: 29.9 pg (ref 26.0–34.0)
MCHC: 33.5 g/dL (ref 30.0–36.0)
MCV: 89.3 fL (ref 78.0–100.0)
PLATELETS: 195 10*3/uL (ref 150–400)
RBC: 4.85 MIL/uL (ref 3.87–5.11)
RDW: 13.8 % (ref 11.5–15.5)
WBC: 10 10*3/uL (ref 4.0–10.5)

## 2014-03-20 LAB — URINE MICROSCOPIC-ADD ON

## 2014-03-20 LAB — ETHANOL

## 2014-03-20 LAB — PROTIME-INR
INR: 0.95 (ref 0.00–1.49)
Prothrombin Time: 12.8 seconds (ref 11.6–15.2)

## 2014-03-20 LAB — APTT: APTT: 30 s (ref 24–37)

## 2014-03-20 MED ORDER — CEPHALEXIN 500 MG PO CAPS
500.0000 mg | ORAL_CAPSULE | Freq: Once | ORAL | Status: AC
  Administered 2014-03-20: 500 mg via ORAL
  Filled 2014-03-20: qty 1

## 2014-03-20 MED ORDER — CEPHALEXIN 500 MG PO CAPS: 500.0000 mg | ORAL_CAPSULE | Freq: Four times a day (QID) | ORAL | Status: DC

## 2014-03-20 NOTE — ED Notes (Signed)
NP at bedside.

## 2014-03-20 NOTE — ED Notes (Signed)
MD at bedside. 

## 2014-03-20 NOTE — ED Notes (Signed)
Hospitalist at bedside 

## 2014-03-20 NOTE — ED Provider Notes (Signed)
CSN: [id number]     Arrival date & time 03/20/14  0909 History   First MD Initiated Contact with Patient 03/20/14 [id number]     Chief Complaint  Patient presents with  . Headache     (Consider location/radiation/quality/duration/timing/severity/associated sxs/prior Treatment) HPI  Cassandra Jordan is a 64 y.o. female who presents to the Emergency Department complaining of right sided blurred vision and headache. She states her symptoms began between 3-4 pm yesterday afternoon.  Upon arrival, she states the symptoms have improved, but not completely resolved.  She describes the headache as a dull, throbbing sensation to her right temple and blurred vision to the right eye only.  She also reports generalized weakness to both legs and reports difficulty walking related to the weakness.  She also states that she drank one beer yesterday prior to onset of her symptoms.  She denies abdominal or chest pain, upper extremity numbness or weakness, facial droop, difficulty speaking or difficulty concentrating.  She also denies previous TIA or CVA   Past Medical History  Diagnosis Date  . Type 2 diabetes mellitus   . Essential hypertension   . Asthma   . Neuropathic pain   . Hepatitis C   . Renal carcinoma   . Hyperlipidemia   . Arthritis   . Rash   . GERD (gastroesophageal reflux disease)   . PSVT (paroxysmal supraventricular tachycardia)    Past Surgical History  Procedure Laterality Date  . Abdominal hysterectomy    . Wart removal    . Colonoscopy N/A 07/08/2013    Procedure: COLONOSCOPY;  Surgeon: [name], MD;  Location: [facility];  Service: Endoscopy;  Laterality: N/A;  7:30  . Esophagogastroduodenoscopy N/A 07/08/2013    Procedure: ESOPHAGOGASTRODUODENOSCOPY (EGD);  Surgeon: [name], MD;  [facility]: [facility];  Service: Endoscopy;  Laterality: N/A;  . Savory dilation N/A 07/08/2013    Procedure: SAVORY DILATION;  Surgeon: [name], MD;  [facility]: [facility];   Service: Endoscopy;  Laterality: N/A;  [name] dilation N/A 07/08/2013    Procedure: [name] DILATION;  Surgeon: [name], MD;  [facility]: [facility];  Service: Endoscopy;  Laterality: N/A;  . Tonsillectomy    . Robot assisted laparoscopic nephrectomy Right 10/14/2013    Procedure: ROBOTIC ASSISTED LAPAROSCOPIC RADICAL NEPHRECTOMY;  Surgeon: [name], MD;  Location: [facility];  Service: Urology;  Laterality: Right;   Family History  Problem Relation Age of Onset  . Colon cancer Sister 65   History  Substance Use Topics  . Smoking status: Current Every Day Smoker -- 0.50 packs/day    Types: Cigarettes  . Smokeless tobacco: Never Used  . Alcohol Use: 0.0 oz/week    0 Standard drinks or equivalent per week     Comment: tTice a week sometimes, drinks 2-3 40 oz.- STOPPED DRINKING 2-3 WKS AGO    OB History    No data available     Review of Systems  Constitutional: Negative for fever, activity change and appetite change.  HENT: Negative for drooling, facial swelling and trouble swallowing.   Eyes: Positive for visual disturbance (blurred vision right eye). Negative for photophobia and pain.  Respiratory: Negative for cough and shortness of breath.   Cardiovascular: Negative for chest pain.  Gastrointestinal: Positive for nausea. Negative for vomiting and abdominal pain.  Genitourinary: Negative for difficulty urinating.  Musculoskeletal: Negative for back pain, neck pain and neck stiffness.  Skin: Negative for rash and wound.  Neurological: Positive  for dizziness, weakness (bilateral lower extremity weakness) and headaches. Negative for facial asymmetry, speech difficulty and numbness.  Psychiatric/Behavioral: Negative for confusion and decreased concentration. The patient is not nervous/anxious.       Allergies  Sulfa antibiotics  Home Medications   Prior to Admission medications   Medication Sig Start Date End Date Taking? Authorizing Provider  amiodarone  (PACERONE) 200 MG tablet Take 1 tablet (200 mg total) by mouth daily. 11/04/13   Imogene Burn, PA-C  aspirin EC 81 MG tablet Take 81 mg by mouth daily.    Historical Provider, MD  Ibuprofen-Diphenhydramine Cit (MOTRIN PM) 200-38 MG TABS Take 2 tablets by mouth at bedtime as needed (pain/sleep).     Historical Provider, MD  insulin detemir (LEVEMIR) 100 UNIT/ML injection Inject 25 Units into the skin 2 (two) times daily.     Historical Provider, MD  lisinopril-hydrochlorothiazide (PRINZIDE,ZESTORETIC) 20-12.5 MG per tablet Take 1 tablet by mouth every morning.    Historical Provider, MD  metFORMIN (GLUCOPHAGE) 500 MG tablet Take 500 mg by mouth 2 (two) times daily with a meal.    Historical Provider, MD  metoprolol tartrate (LOPRESSOR) 25 MG tablet Take 1 tablet (25 mg total) by mouth 2 (two) times daily. 11/04/13   Imogene Burn, PA-C  naproxen sodium (ANAPROX) 220 MG tablet Take 440 mg by mouth daily as needed (pain).    Historical Provider, MD  oxyCODONE-acetaminophen (ROXICET) 5-325 MG per tablet Take 1-2 tablets by mouth every 6 (six) hours as needed for moderate pain or severe pain. Post-operaitvely Patient not taking: Reported on 02/10/2014 10/20/13   [name], MD  pregabalin (LYRICA) 75 MG capsule Take 75 mg by mouth 3 (three) times daily.     Historical Provider, MD  senna-docusate (SENOKOT-S) 8.6-50 MG per tablet Take 1 tablet by mouth 2 (two) times daily. While taking pain meds to prevent constipation Patient not taking: Reported on 02/10/2014 10/20/13   [name], MD   BP 151/89 mmHg  Pulse 72  Temp(Src) 98.1 F (36.7 C) (Oral)  Resp 17  Ht 5' (1.524 m)  Wt 149 lb (67.586 kg)  BMI 29.10 kg/m2  SpO2 98% Physical Exam  Constitutional: She is oriented to person, place, and time. She appears well-developed and well-nourished. No distress.  HENT:  Head: Normocephalic and atraumatic.  Mouth/Throat: Oropharynx is clear and moist.  Eyes: Conjunctivae and EOM are normal.  Pupils are equal, round, and reactive to light.  Neck: Normal range of motion. Neck supple.  Cardiovascular: Normal rate, regular rhythm, normal heart sounds and intact distal pulses.   No murmur heard. Pulmonary/Chest: Effort normal and breath sounds normal. No respiratory distress.  Abdominal: Soft. She exhibits no distension. There is no tenderness. There is no rebound and no guarding.  Musculoskeletal: Normal range of motion.  Lymphadenopathy:    She has no cervical adenopathy.  Neurological: She is alert and oriented to person, place, and time. No sensory deficit. She exhibits normal muscle tone. Coordination normal. GCS eye subscore is 4. GCS verbal subscore is 5. GCS motor subscore is 6. She displays no Babinski's sign on the right side. She displays no Babinski's sign on the left side.  Bilateral LE weakness on attempted SLR. No pronator drift ot facial droop.  Speech is clear and non-labored  Skin: Skin is warm. No rash noted.  Psychiatric: She has a normal mood and affect. Thought content normal.  Nursing note and vitals reviewed.   ED Course  Procedures (including critical care  time) Labs Review Labs Reviewed  DIFFERENTIAL - Abnormal; Notable for the following:    Lymphocytes Relative 50 (*)    Lymphs Abs 5.0 (*)    All other components within normal limits  COMPREHENSIVE METABOLIC PANEL - Abnormal; Notable for the following:    Potassium 3.4 (*)    Glucose, Bld 137 (*)    AST 85 (*)    ALT 90 (*)    GFR calc non Af Amer 61 (*)    GFR calc Af Amer 71 (*)    All other components within normal limits  URINE RAPID DRUG SCREEN (HOSP PERFORMED) - Abnormal; Notable for the following:    Cocaine POSITIVE (*)    All other components within normal limits  URINALYSIS, ROUTINE W REFLEX MICROSCOPIC - Abnormal; Notable for the following:    APPearance CLOUDY (*)    Hgb urine dipstick TRACE (*)    Protein, ur TRACE (*)    Leukocytes, UA SMALL (*)    All other components within  normal limits  URINE MICROSCOPIC-ADD ON - Abnormal; Notable for the following:    Squamous Epithelial / LPF MANY (*)    Bacteria, UA MANY (*)    All other components within normal limits  URINE CULTURE  ETHANOL  PROTIME-INR  APTT  CBC  SEDIMENTATION RATE  TROPONIN I    Imaging Review Ct Head Wo Contrast  03/20/2014   CLINICAL DATA:  Headache and blurry vision  EXAM: CT HEAD WITHOUT CONTRAST  TECHNIQUE: Contiguous axial images were obtained from the base of the skull through the vertex without intravenous contrast.  COMPARISON:  None.  FINDINGS: Mild atrophic changes are noted. No findings to suggest acute hemorrhage, acute infarction or space-occupying mass lesion are noted. No acute bony abnormality is noted.  IMPRESSION: Mild atrophic changes without acute abnormality.   Electronically Signed   By: Inez Catalina M.D.   On: 03/20/2014 10:26     EKG Interpretation   Date/Time:  Saturday March 20 2014 09:22:39 EST Ventricular Rate:  76 PR Interval:  214 QRS Duration: 82 QT Interval:  483 QTC Calculation: 543 R Axis:   22 Text Interpretation:  Sinus rhythm Borderline prolonged PR interval LVH by  voltage Prolonged QT interval Baseline wander in lead(s) V6 artifact noted  which limits evaluation No significant change since last tracing Confirmed  by Christy Gentles  MD, Elenore Rota ([id number]) on 03/20/2014 9:37:56 AM      MDM   Final diagnoses:  Other headache syndrome  UTI (lower urinary tract infection)   Patient with right sided headache and visual changes,  Sx's concerning for TIA, will order stroke protocol although sx onset greater than 4 hours . Dr. Christy Gentles to also evaluate  1215  Consulted Dr. Jerilee Hoh who agrees to evaluate the patient in the ED.    Patient was seen by the hospitalist and it was determined that patient is stable to be discharged home with close f/u with her ophthmalogist and outpatient treatment for UTI.  Urine culture is pending.  Patient agrees to this plan.   I have given restrict return precautions.  I have also discussed with Dr. Christy Gentles .  Jeanell Mangan L. Ammie Ferrier 03/21/14 2105  Sharyon Cable, MD 03/22/14 1140

## 2014-03-20 NOTE — ED Notes (Signed)
Unable to obtain blood, lab at bedside.

## 2014-03-20 NOTE — ED Provider Notes (Signed)
Patient seen/examined in the Emergency Department in conjunction with Midlevel Provider [place] Patient reports HA/visual changes in right eye as well weakness in legs, starting yesterday Exam : awake/alert, no arm/leg drift, no facial droop Plan: pt reports HA with OD visual loss, but improving, possible TIA advised admission  tPA in stroke considered but not given due to:  Onset over 3-4.5hours       Sharyon Cable, MD 03/20/14 1051

## 2014-03-20 NOTE — ED Notes (Signed)
Patient c/o headache with blurred vision to right eye. Per patient started yesterday around 1pm. Patient also reports weakness in legs bilaterally and "slight dizziness." Denies any slurred speech, facial drooping, or chest pain.

## 2014-03-20 NOTE — Consult Note (Signed)
Requesting physician: Dr. Christy Gentles, EDP  Primary Care Physician: Everrett Coombe, NP  Reason for consultation: Potential admission   History of Present Illness: Patient is a 64 year old woman with history of type 2 diabetes, cocaine abuse, multiple social stressors who presents to the emergency department at [facility] today with complaints of generalized weakness (that she describes as shakiness of her legs) urinary frequency, as well as right eye blurred vision that has been progressive for about 8-10 months but then acutely got worse 3-4 days ago. She describes her vision deficits as having "gray flecks that appear and disappear but the flecks have gotten more and more together for the past 3 days"she states she follows with an eye doctor (A? Dr. Chong Sicilian) and about 18 months ago had laser eye surgery to her left eye for her diabetes and was told by the eye doctor that she would eventually need treatment for her right eye as well.. In the emergency department she was found to have a potassium level of 3.4, an elevated glucose to 137, slightly elevated AST's and ALT 285 and 90 respectively, rest of blood work is within normal limits. Urinalysis is indicative of a UTI. She also had a CT scan of her head that showed mild atrophic changes but no acute abnormalities. We have been asked to evaluate her for potential admission given concerns for a possible TIA.  Allergies:   Allergies  Allergen Reactions  . Sulfa Antibiotics Itching and Other (See Comments)    burning      Past Medical History  Diagnosis Date  . Type 2 diabetes mellitus   . Essential hypertension   . Asthma   . Neuropathic pain   . Hepatitis C   . Renal carcinoma   . Hyperlipidemia   . Arthritis   . Rash   . GERD (gastroesophageal reflux disease)   . PSVT (paroxysmal supraventricular tachycardia)     Past Surgical History  Procedure Laterality Date  . Abdominal hysterectomy    . Wart removal    .  Colonoscopy N/A 07/08/2013    Procedure: COLONOSCOPY;  Surgeon: [name], MD;  [facility];  Service: Endoscopy;  Laterality: N/A;  7:30  . Esophagogastroduodenoscopy N/A 07/08/2013    Procedure: ESOPHAGOGASTRODUODENOSCOPY (EGD);  Surgeon: [name], MD;  [facility];  Service: Endoscopy;  Laterality: N/A;  . Savory dilation N/A 07/08/2013    Procedure: SAVORY DILATION;  Surgeon: [name], MD;  [facility];  Service: Endoscopy;  Laterality: N/A;  [name] dilation N/A 07/08/2013    Procedure: [name] DILATION;  Surgeon: [name], MD;  [facility];  Service: Endoscopy;  Laterality: N/A;  . Tonsillectomy    . Robot assisted laparoscopic nephrectomy Right 10/14/2013    Procedure: ROBOTIC ASSISTED LAPAROSCOPIC RADICAL NEPHRECTOMY;  Surgeon: [name], MD;  Location: [facility];  Service: Urology;  Laterality: Right;    Scheduled Meds: Continuous Infusions: PRN Meds:.  Social History:  reports that she has been smoking Cigarettes.  She has a 10 pack-year smoking history. She has never used smokeless tobacco. She reports that she drinks alcohol. She reports that she uses illicit drugs (Marijuana).  Family History  Problem Relation Age of Onset  . Colon cancer Sister 62    Review of Systems:  Constitutional: Denies fever, chills, diaphoresis, appetite change and fatigue.  HEENT: Denies photophobia, eye pain, redness, hearing loss, ear pain, congestion, sore throat, rhinorrhea, sneezing, mouth sores, trouble swallowing,  neck pain, neck stiffness and tinnitus.   Respiratory: Denies SOB, DOE, cough, chest tightness,  and wheezing.   Cardiovascular: Denies chest pain, palpitations and leg swelling.  Gastrointestinal: Denies nausea, vomiting, abdominal pain, diarrhea, constipation, blood in stool and abdominal distention.  Genitourinary: Denies dysuria, urgency, frequency, hematuria, flank pain and difficulty urinating.   Endocrine: Denies: hot or cold intolerance, sweats, changes in hair or nails, polyuria, polydipsia. Musculoskeletal: Denies myalgias, back pain, joint swelling, arthralgias and gait problem.  Skin: Denies pallor, rash and wound.  Neurological: Denies dizziness, seizures, syncope, weakness, light-headedness, numbness and headaches.  Hematological: Denies adenopathy. Easy bruising, personal or family bleeding history  Psychiatric/Behavioral: Denies suicidal ideation, mood changes, confusion, nervousness, sleep disturbance and agitation   Physical Exam: Blood pressure 154/88, pulse 70, temperature 98.1 F (36.7 C), temperature source Oral, resp. rate 23, height 5' (1.524 m), weight 67.586 kg (149 lb), SpO2 98 %. General: Alert, awake, oriented 3, no distress. HEENT: Normocephalic, atraumatic, pupils equal round and reactive to light, extraocular movements intact, dry mucous membranes, poor dentition, mild pharyngeal erythema. Neck: Supple, no JVD, no lymphadenopathy, no bruits, no goiter. Cardiovascular: Regular rate and rhythm, no murmurs, rubs or gallops. Lungs: Clear to auscultation bilaterally. Abdomen: Soft, nontender, nondistended, positive bowel sounds, no masses or organomegaly noted. Extremities: No clubbing, cyanosis or edema, positive pedal pulses. Neurologic: Grossly intact and nonfocal. I have not ambulated her.  Labs on Admission:  Results for orders placed or performed during the hospital encounter of 03/20/14 (from the past 48 hour(s))  Ethanol     Status: None   Collection Time: 03/20/14 10:00 AM  Result Value Ref Range   Alcohol, Ethyl (B) <5 0 - 9 mg/dL    Comment:        LOWEST DETECTABLE LIMIT FOR SERUM ALCOHOL IS 11 mg/dL FOR MEDICAL PURPOSES ONLY   Protime-INR     Status: None   Collection Time: 03/20/14 10:00 AM  Result Value Ref Range   Prothrombin Time 12.8 11.6 - 15.2 seconds   INR 0.95 0.00 - 1.49  APTT     Status: None   Collection Time: 03/20/14  10:00 AM  Result Value Ref Range   aPTT 30 24 - 37 seconds  CBC     Status: None   Collection Time: 03/20/14 10:00 AM  Result Value Ref Range   WBC 10.0 4.0 - 10.5 K/uL   RBC 4.85 3.87 - 5.11 MIL/uL   Hemoglobin 14.5 12.0 - 15.0 g/dL   HCT 43.3 36.0 - 46.0 %   MCV 89.3 78.0 - 100.0 fL   MCH 29.9 26.0 - 34.0 pg   MCHC 33.5 30.0 - 36.0 g/dL   RDW 13.8 11.5 - 15.5 %   Platelets 195 150 - 400 K/uL  Differential     Status: Abnormal   Collection Time: 03/20/14 10:00 AM  Result Value Ref Range   Neutrophils Relative % 43 43 - 77 %   Neutro Abs 4.3 1.7 - 7.7 K/uL   Lymphocytes Relative 50 (H) 12 - 46 %   Lymphs Abs 5.0 (H) 0.7 - 4.0 K/uL   Monocytes Relative 6 3 - 12 %   Monocytes Absolute 0.6 0.1 - 1.0 K/uL   Eosinophils Relative 1 0 - 5 %   Eosinophils Absolute 0.1 0.0 - 0.7 K/uL   Basophils Relative 0 0 - 1 %   Basophils Absolute 0.0 0.0 - 0.1 K/uL  Comprehensive metabolic panel     Status: Abnormal   Collection  Time: 03/20/14 10:00 AM  Result Value Ref Range   Sodium 138 135 - 145 mmol/L   Potassium 3.4 (L) 3.5 - 5.1 mmol/L   Chloride 101 96 - 112 mmol/L   CO2 32 19 - 32 mmol/L   Glucose, Bld 137 (H) 70 - 99 mg/dL   BUN 13 6 - 23 mg/dL   Creatinine, Ser 0.97 0.50 - 1.10 mg/dL   Calcium 9.2 8.4 - 10.5 mg/dL   Total Protein 7.9 6.0 - 8.3 g/dL   Albumin 3.7 3.5 - 5.2 g/dL   AST 85 (H) 0 - 37 U/L   ALT 90 (H) 0 - 35 U/L   Alkaline Phosphatase 62 39 - 117 U/L   Total Bilirubin 0.4 0.3 - 1.2 mg/dL   GFR calc non Af Amer 61 (L) >90 mL/min   GFR calc Af Amer 71 (L) >90 mL/min    Comment: (NOTE) The eGFR has been calculated using the CKD EPI equation. This calculation has not been validated in all clinical situations. eGFR's persistently <90 mL/min signify possible Chronic Kidney Disease.    Anion gap 5 5 - 15  Sedimentation rate     Status: None   Collection Time: 03/20/14 10:00 AM  Result Value Ref Range   Sed Rate 8 0 - 22 mm/hr  Troponin I     Status: None    Collection Time: 03/20/14 10:10 AM  Result Value Ref Range   Troponin I <0.03 <0.031 ng/mL    Comment:        NO INDICATION OF MYOCARDIAL INJURY.   Urinalysis, Routine w reflex microscopic     Status: Abnormal   Collection Time: 03/20/14 10:38 AM  Result Value Ref Range   Color, Urine YELLOW YELLOW   APPearance CLOUDY (A) CLEAR   Specific Gravity, Urine 1.010 1.005 - 1.030   pH 6.5 5.0 - 8.0   Glucose, UA NEGATIVE NEGATIVE mg/dL   Hgb urine dipstick TRACE (A) NEGATIVE   Bilirubin Urine NEGATIVE NEGATIVE   Ketones, ur NEGATIVE NEGATIVE mg/dL   Protein, ur TRACE (A) NEGATIVE mg/dL   Urobilinogen, UA 0.2 0.0 - 1.0 mg/dL   Nitrite NEGATIVE NEGATIVE   Leukocytes, UA SMALL (A) NEGATIVE  Urine microscopic-add on     Status: Abnormal   Collection Time: 03/20/14 10:38 AM  Result Value Ref Range   Squamous Epithelial / LPF MANY (A) RARE   WBC, UA 11-20 <3 WBC/hpf   RBC / HPF 3-6 <3 RBC/hpf   Bacteria, UA MANY (A) RARE  Urine Drug Screen     Status: Abnormal   Collection Time: 03/20/14 10:39 AM  Result Value Ref Range   Opiates NONE DETECTED NONE DETECTED   Cocaine POSITIVE (A) NONE DETECTED   Benzodiazepines NONE DETECTED NONE DETECTED   Amphetamines NONE DETECTED NONE DETECTED   Tetrahydrocannabinol NONE DETECTED NONE DETECTED   Barbiturates NONE DETECTED NONE DETECTED    Comment:        DRUG SCREEN FOR MEDICAL PURPOSES ONLY.  IF CONFIRMATION IS NEEDED FOR ANY PURPOSE, NOTIFY LAB WITHIN 5 DAYS.        LOWEST DETECTABLE LIMITS FOR URINE DRUG SCREEN Drug Class       Cutoff (ng/mL) Amphetamine      1000 Barbiturate      200 Benzodiazepine   785 Tricyclics       885 Opiates          300 Cocaine          300 THC  50     Radiological Exams on Admission: Ct Head Wo Contrast  03/20/2014   CLINICAL DATA:  Headache and blurry vision  EXAM: CT HEAD WITHOUT CONTRAST  TECHNIQUE: Contiguous axial images were obtained from the base of the skull through the vertex  without intravenous contrast.  COMPARISON:  None.  FINDINGS: Mild atrophic changes are noted. No findings to suggest acute hemorrhage, acute infarction or space-occupying mass lesion are noted. No acute bony abnormality is noted.  IMPRESSION: Mild atrophic changes without acute abnormality.   Electronically Signed   By: Inez Catalina M.D.   On: 03/20/2014 10:26    Assessment/Plan Principal Problem:   Blurred vision, right eye Active Problems:   Type 2 diabetes mellitus   UTI (lower urinary tract infection)   Cocaine abuse   Generalized weakness   Blurred vision in the right eye  -Do not suspect this to be amaurosis fugax given her history. 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 -Unclear what role if any her current cocaine use is playing in this. -Do not believe that admission for TIA is warranted in this lady at this time given lack of focal symptoms and history of eye disturbances.   UTI -Would send her home on a 5-7 day course of Cipro.  Hypokalemia -Give KCl 40 mEq orally prior to discharge.  Cocaine abuse -Discussed with patient in detail. -She states that she has only been using this recently after her sister came to live with her, there are a lot of social anxieties playing a role in this. -She is not interested in outpatient cessation resources and states that she can quit on her own.  Generalized weakness -Patient describes this more as shakiness of her extremities. She tells me that she has been to the bathroom a few times since arrival to the emergency department and does not have any problems ambulating.  Believe patient can safely be discharged home from the emergency department today with close follow-up with her ophthalmologist, as well as a prescription for Cipro for her UTI and follow-up with the [facility]  department.  Time Spent on Consultation: 75 minutes  HERNANDEZ ACOSTA,ESTELA [facility]  [phone] 03/20/2014, 1:19 PM

## 2014-03-20 NOTE — Discharge Instructions (Signed)
Urinary Tract Infection A urinary tract infection (UTI) can occur any place along the urinary tract. The tract includes the kidneys, ureters, bladder, and urethra. A type of germ called bacteria often causes a UTI. UTIs are often helped with antibiotic medicine.  HOME CARE   If given, take antibiotics as told by your doctor. Finish them even if you start to feel better.  Drink enough fluids to keep your pee (urine) clear or pale yellow.  Avoid tea, drinks with caffeine, and bubbly (carbonated) drinks.  Pee often. Avoid holding your pee in for a long time.  Pee before and after having sex (intercourse).  Wipe from front to back after you poop (bowel movement) if you are a woman. Use each tissue only once. GET HELP RIGHT AWAY IF:   You have back pain.  You have lower belly (abdominal) pain.  You have chills.  You feel sick to your stomach (nauseous).  You throw up (vomit).  Your burning or discomfort with peeing does not go away.  You have a fever.  Your symptoms are not better in 3 days. MAKE SURE YOU:   Understand these instructions.  Will watch your condition.  Will get help right away if you are not doing well or get worse. Document Released: 06/27/2007 Document Revised: 10/03/2011 Document Reviewed: 08/09/2011 [facility] Patient Information 2015 [place], [place]. This information is not intended to replace advice given to you by your health care provider. Make sure you discuss any questions you have with your health care provider.  General Headache Without Cause A general headache is pain or discomfort felt around the head or neck area. The cause may not be found.  HOME CARE   Keep all doctor visits.  Only take medicines as told by your doctor.  Lie down in a dark, quiet room when you have a headache.  Keep a journal to find out if certain things bring on headaches. For example, write down:  What you eat and drink.  How much sleep you get.  Any change to your  diet or medicines.  Relax by getting a massage or doing other relaxing activities.  Put ice or heat packs on the head and neck area as told by your doctor.  Lessen stress.  Sit up straight. Do not tighten (tense) your muscles.  Quit smoking if you smoke.  Lessen how much alcohol you drink.  Lessen how much caffeine you drink, or stop drinking caffeine.  Eat and sleep on a regular schedule.  Get 7 to 9 hours of sleep, or as told by your doctor.  Keep lights dim if bright lights bother you or make your headaches worse. GET HELP RIGHT AWAY IF:   Your headache becomes really bad.  You have a fever.  You have a stiff neck.  You have trouble seeing.  Your muscles are weak, or you lose muscle control.  You lose your balance or have trouble walking.  You feel like you will pass out (faint), or you pass out.  You have really bad symptoms that are different than your first symptoms.  You have problems with the medicines given to you by your doctor.  Your medicines do not work.  Your headache feels different than the other headaches.  You feel sick to your stomach (nauseous) or throw up (vomit). MAKE SURE YOU:   Understand these instructions.  Will watch your condition.  Will get help right away if you are not doing well or get worse. Document Released: 10/18/2007  Document Revised: 04/02/2011 Document Reviewed: 12/29/2010 [facility] Patient Information 2015 [place], [place]. This information is not intended to replace advice given to you by your health care provider. Make sure you discuss any questions you have with your health care provider.

## 2014-03-22 LAB — URINE CULTURE: Colony Count: >=100000

## 2014-06-02 ENCOUNTER — Encounter (HOSPITAL_COMMUNITY): Payer: Self-pay | Admitting: Emergency Medicine

## 2014-06-02 ENCOUNTER — Emergency Department (HOSPITAL_COMMUNITY)
Admission: EM | Admit: 2014-06-02 | Discharge: 2014-06-02 | Disposition: A | Discharge status: Home / Self Care | Payer: 59 | Attending: Emergency Medicine | Admitting: Emergency Medicine

## 2014-06-02 DIAGNOSIS — J45909 Unspecified asthma, uncomplicated: Secondary | ICD-10-CM | POA: Insufficient documentation

## 2014-06-02 DIAGNOSIS — Z792 Long term (current) use of antibiotics: Secondary | ICD-10-CM | POA: Diagnosis not present

## 2014-06-02 DIAGNOSIS — I10 Essential (primary) hypertension: Secondary | ICD-10-CM | POA: Insufficient documentation

## 2014-06-02 DIAGNOSIS — Z791 Long term (current) use of non-steroidal anti-inflammatories (NSAID): Secondary | ICD-10-CM | POA: Insufficient documentation

## 2014-06-02 DIAGNOSIS — G629 Polyneuropathy, unspecified: Secondary | ICD-10-CM | POA: Insufficient documentation

## 2014-06-02 DIAGNOSIS — E119 Type 2 diabetes mellitus without complications: Secondary | ICD-10-CM | POA: Diagnosis not present

## 2014-06-02 DIAGNOSIS — M792 Neuralgia and neuritis, unspecified: Secondary | ICD-10-CM

## 2014-06-02 DIAGNOSIS — Z8719 Personal history of other diseases of the digestive system: Secondary | ICD-10-CM | POA: Insufficient documentation

## 2014-06-02 DIAGNOSIS — Z8619 Personal history of other infectious and parasitic diseases: Secondary | ICD-10-CM | POA: Diagnosis not present

## 2014-06-02 DIAGNOSIS — Z85528 Personal history of other malignant neoplasm of kidney: Secondary | ICD-10-CM | POA: Insufficient documentation

## 2014-06-02 DIAGNOSIS — Z7952 Long term (current) use of systemic steroids: Secondary | ICD-10-CM | POA: Diagnosis not present

## 2014-06-02 DIAGNOSIS — Z720 Tobacco use: Secondary | ICD-10-CM | POA: Diagnosis not present

## 2014-06-02 DIAGNOSIS — M79672 Pain in left foot: Secondary | ICD-10-CM | POA: Diagnosis present

## 2014-06-02 LAB — CBG MONITORING, ED: Glucose-Capillary: 155 mg/dL — ABNORMAL HIGH (ref 70–99)

## 2014-06-02 MED ORDER — PREDNISONE 10 MG PO TABS: 20.0000 mg | ORAL_TABLET | Freq: Two times a day (BID) | ORAL | Status: DC

## 2014-06-02 NOTE — Discharge Instructions (Signed)
We are treating you with a medication that should help with the rash and itching and with the pain. You will need to make an appointment with your doctor to follow up for the pain and the rash. The medication may cause you blood sugar to go up some. Be sure to check your blood sugar regularly.

## 2014-06-02 NOTE — ED Provider Notes (Signed)
CSN: [id number]     Arrival date & time 06/02/14  1542 History   First MD Initiated Contact with Patient 06/02/14 1711     Chief Complaint  Patient presents with  . Peripheral Neuropathy     (Consider location/radiation/quality/duration/timing/severity/associated sxs/prior Treatment) The history is provided by the patient.   Cassandra Jordan is a 64 y.o. female who presents to the ED with bilateral foot pain. She states that she has diabetic neuropathy that causes pain in her feet and legs. She was started on Neurontin a few weeks ago and does not feel that it is helping the pain. She has not reported to her doctor that she continues to have pain. Patient is here tonight for pain management.   Review of the patient chart from 2/16 she was positive for Cocaine.  Past Medical History  Diagnosis Date  . Type 2 diabetes mellitus   . Essential hypertension   . Asthma   . Neuropathic pain   . Hepatitis C   . Renal carcinoma   . Hyperlipidemia   . Arthritis   . Rash   . GERD (gastroesophageal reflux disease)   . PSVT (paroxysmal supraventricular tachycardia)    Past Surgical History  Procedure Laterality Date  . Abdominal hysterectomy    . Wart removal    . Colonoscopy N/A 07/08/2013    Procedure: COLONOSCOPY;  Surgeon: [name], MD;  [facility];  Service: Endoscopy;  Laterality: N/A;  7:30  . Esophagogastroduodenoscopy N/A 07/08/2013    Procedure: ESOPHAGOGASTRODUODENOSCOPY (EGD);  Surgeon: [name], MD;  [facility];  Service: Endoscopy;  Laterality: N/A;  . Savory dilation N/A 07/08/2013    Procedure: SAVORY DILATION;  Surgeon: [name], MD;  [facility];  Service: Endoscopy;  Laterality: N/A;  [name] dilation N/A 07/08/2013    Procedure: [name] DILATION;  Surgeon: [name], MD;  [facility];  Service: Endoscopy;  Laterality: N/A;  . Tonsillectomy    . Robot assisted laparoscopic nephrectomy Right 10/14/2013     Procedure: ROBOTIC ASSISTED LAPAROSCOPIC RADICAL NEPHRECTOMY;  Surgeon: [name], MD;  Location: [facility];  Service: Urology;  Laterality: Right;   Family History  Problem Relation Age of Onset  . Colon cancer Sister 34   History  Substance Use Topics  . Smoking status: Current Some Day Smoker -- 0.00 packs/day for 20 years    Types: Cigarettes  . Smokeless tobacco: Never Used  . Alcohol Use: 0.0 oz/week    0 Standard drinks or equivalent per week     Comment: trying to stop   OB History    No data available     Review of Systems Negative except as stated in HPI   Allergies  Sulfa antibiotics  Home Medications   Prior to Admission medications   Medication Sig Start Date End Date Taking? Authorizing Provider  amiodarone (PACERONE) 200 MG tablet Take 1 tablet (200 mg total) by mouth daily. 11/04/13   Imogene Burn, PA-C  aspirin EC 81 MG tablet Take 81 mg by mouth daily.    Historical Provider, MD  cephALEXin (KEFLEX) 500 MG capsule Take 1 capsule (500 mg total) by mouth 4 (four) times daily. For 7 days 03/20/14   Tammy Triplett, PA-C  Ibuprofen-Diphenhydramine Cit (MOTRIN PM) 200-38 MG TABS Take 2 tablets by mouth at bedtime as needed (pain/sleep).     Historical Provider, MD  insulin detemir (LEVEMIR) 100 UNIT/ML injection Inject 25  Units into the skin 2 (two) times daily.     Historical Provider, MD  lisinopril-hydrochlorothiazide (PRINZIDE,ZESTORETIC) 20-12.5 MG per tablet Take 1 tablet by mouth every morning.    Historical Provider, MD  metFORMIN (GLUCOPHAGE) 500 MG tablet Take 500 mg by mouth 2 (two) times daily with a meal.    Historical Provider, MD  metoprolol tartrate (LOPRESSOR) 25 MG tablet Take 1 tablet (25 mg total) by mouth 2 (two) times daily. 11/04/13   Imogene Burn, PA-C  naproxen sodium (ANAPROX) 220 MG tablet Take 440 mg by mouth daily as needed (pain).    Historical Provider, MD  oxyCODONE-acetaminophen (ROXICET) 5-325 MG per tablet Take 1-2  tablets by mouth every 6 (six) hours as needed for moderate pain or severe pain. Post-operaitvely Patient not taking: Reported on 02/10/2014 10/20/13   [name], MD  predniSONE (DELTASONE) 10 MG tablet Take 2 tablets (20 mg total) by mouth 2 (two) times daily with a meal. 06/02/14   Hope Bunnie Pion, NP  pregabalin (LYRICA) 75 MG capsule Take 75 mg by mouth 3 (three) times daily.     Historical Provider, MD  senna-docusate (SENOKOT-S) 8.6-50 MG per tablet Take 1 tablet by mouth 2 (two) times daily. While taking pain meds to prevent constipation Patient not taking: Reported on 02/10/2014 10/20/13   [name], MD   BP 150/88 mmHg  Pulse 70  Temp(Src) 98.5 F (36.9 C) (Oral)  Resp 12  Ht 5' (1.524 m)  Wt 154 lb (69.854 kg)  BMI 30.08 kg/m2  SpO2 95% Physical Exam  Constitutional: She is oriented to person, place, and time. She appears well-developed and well-nourished. No distress.  HENT:  Head: Normocephalic.  Eyes: EOM are normal.  Neck: Neck supple.  Cardiovascular: Normal rate.   Pulmonary/Chest: Effort normal.  Abdominal: Soft. There is no tenderness.  Musculoskeletal: Normal range of motion.  Bilateral foot tenderness with palpation. There is a rash noted to the inner aspect of the ankle area and between the toes.   No calf tenderness, pedal pulses equal, adequate circulation.   Neurological: She is alert and oriented to person, place, and time. No cranial nerve deficit.  Skin: Skin is warm and dry.  Psychiatric: She has a normal mood and affect. Her behavior is normal.  Nursing note and vitals reviewed.   ED Course  Procedures (including critical care time) I discussed this case with Dr. Rogene Houston, will give short course of prednisone and advise patient of need for close check on blood sugar. She is to call her PCP to discuss further pain management.  Labs Review  Results for orders placed or performed during the hospital encounter of 06/02/14 (from the past 24 hour(s))   POC CBG, ED     Status: Abnormal   Collection Time: 06/02/14  6:15 PM  Result Value Ref Range   Glucose-Capillary 155 (H) 70 - 99 mg/dL     MDM  64 y.o. female with lower extremity pain due to diabetic nerve pain. Stable for d/c to follow up with PCP. Ambulatory on d/c without difficulty.   Final diagnoses:  Neuropathic pain       Ashley Murrain, NP 06/03/14 1647  Fredia Sorrow, MD 06/09/14 1431

## 2014-06-02 NOTE — ED Notes (Signed)
Pt states that she has diabetic neuropathy and her feet and legs are hurting.  States that neurontin is not helping.

## 2014-06-10 ENCOUNTER — Ambulatory Visit (INDEPENDENT_AMBULATORY_CARE_PROVIDER_SITE_OTHER): Payer: 59 | Admitting: Cardiology

## 2014-06-10 ENCOUNTER — Ambulatory Visit: Payer: Self-pay | Admitting: Cardiology

## 2014-06-10 ENCOUNTER — Encounter: Payer: Self-pay | Admitting: Cardiology

## 2014-06-10 VITALS — BP 142/78 | HR 68 | Ht 60.0 in | Wt 158.0 lb

## 2014-06-10 DIAGNOSIS — I471 Supraventricular tachycardia: Secondary | ICD-10-CM

## 2014-06-10 DIAGNOSIS — I10 Essential (primary) hypertension: Secondary | ICD-10-CM

## 2014-06-10 DIAGNOSIS — Z79899 Other long term (current) drug therapy: Secondary | ICD-10-CM

## 2014-06-10 NOTE — Progress Notes (Signed)
Cardiology Office Note  Date: 06/10/2014   ID: Cassandra Jordan, DOB [birth year], MRN [id number]  PCP: Everrett Coombe, NP  Primary Cardiologist: Rozann Lesches, MD   Chief Complaint  Patient presents with  . PSVT    History of Present Illness: Cassandra Jordan is a 64 y.o. female last seen by Ms. Cassandra Jordan in October 2015. 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 She was seen by Dr. Meda Coffee and treated with amiodarone in addition to beta blocker. At her last visit, amiodarone dose was decreased.  She comes in today reporting no significant palpitations or chest pain. We reviewed her medications, she remains on amiodarone 200 mg daily as well as aspirin and Lopressor.  Follow-up ECG today is reviewed showing sinus bradycardia with LVH and repolarization abnormalities. She has not had follow-up lab work as yet. I reviewed her last results from February.   Past Medical History  Diagnosis Date  . Type 2 diabetes mellitus   . Essential hypertension   . Asthma   . Neuropathic pain   . Hepatitis C   . Renal carcinoma   . Hyperlipidemia   . Arthritis   . Rash   . GERD (gastroesophageal reflux disease)   . PSVT (paroxysmal supraventricular tachycardia)     Past Surgical History  Procedure Laterality Date  . Abdominal hysterectomy    . Wart removal    . Colonoscopy N/A 07/08/2013    Procedure: COLONOSCOPY;  Surgeon: [name], MD;  [facility];  Service: Endoscopy;  Laterality: N/A;  7:30  . Esophagogastroduodenoscopy N/A 07/08/2013    Procedure: ESOPHAGOGASTRODUODENOSCOPY (EGD);  Surgeon: [name], MD;  [facility];  Service: Endoscopy;  Laterality: N/A;  . Savory dilation N/A 07/08/2013    Procedure: SAVORY DILATION;  Surgeon: [name], MD;  [facility];  Service: Endoscopy;  Laterality: N/A;  [name] dilation N/A 07/08/2013    Procedure: [name]  DILATION;  Surgeon: [name], MD;  [facility];  Service: Endoscopy;  Laterality: N/A;  . Tonsillectomy    . Robot assisted laparoscopic nephrectomy Right 10/14/2013    Procedure: ROBOTIC ASSISTED LAPAROSCOPIC RADICAL NEPHRECTOMY;  Surgeon: [name], MD;  Location: [facility];  Service: Urology;  Laterality: Right;    Current Outpatient Prescriptions  Medication Sig Dispense Refill  . amiodarone (PACERONE) 200 MG tablet Take 1 tablet (200 mg total) by mouth daily. 90 tablet 3  . aspirin EC 81 MG tablet Take 81 mg by mouth daily.    . insulin detemir (LEVEMIR) 100 UNIT/ML injection Inject 25 Units into the skin 2 (two) times daily.     Marland Kitchen lisinopril-hydrochlorothiazide (PRINZIDE,ZESTORETIC) 20-12.5 MG per tablet Take 1 tablet by mouth every morning.    . metFORMIN (GLUCOPHAGE) 500 MG tablet Take 500 mg by mouth 2 (two) times daily with a meal.    . metoprolol tartrate (LOPRESSOR) 25 MG tablet Take 1 tablet (25 mg total) by mouth 2 (two) times daily. 60 tablet 3  . oxyCODONE-acetaminophen (ROXICET) 5-325 MG per tablet Take 1-2 tablets by mouth every 6 (six) hours as needed for moderate pain or severe pain. Post-operaitvely 40 tablet 0  . predniSONE (DELTASONE) 10 MG tablet Take 2 tablets (20 mg total) by mouth 2 (two) times daily with a meal. 16 tablet 0  . pregabalin (LYRICA) 75 MG capsule Take 75 mg by mouth 3 (three) times daily.     Marland Kitchen  senna-docusate (SENOKOT-S) 8.6-50 MG per tablet Take 1 tablet by mouth 2 (two) times daily. While taking pain meds to prevent constipation 30 tablet 0   No current facility-administered medications for this visit.    Allergies:  Sulfa antibiotics   Social History: The patient  reports that she has been smoking Cigarettes.  She has been smoking about 0.00 packs per day for the past 20 years. She has never used smokeless tobacco. She reports that she drinks alcohol. She reports that she uses illicit drugs (Marijuana).   ROS:  Please see the  history of present illness. Otherwise, complete review of systems is positive for none.  All other systems are reviewed and negative.   Physical Exam: VS:  BP 142/78 mmHg  Pulse 68  Ht 5' (1.524 m)  Wt 158 lb (71.668 kg)  BMI 30.86 kg/m2  SpO2 98%, BMI Body mass index is 30.86 kg/(m^2).  Wt Readings from Last 3 Encounters:  06/10/14 158 lb (71.668 kg)  06/02/14 154 lb (69.854 kg)  03/20/14 149 lb (67.586 kg)     General: Patient appears comfortable at rest. HEENT: Conjunctiva and lids normal, oropharynx clear. Neck: Supple, no elevated JVP or carotid bruits, no thyromegaly. Lungs: Clear to auscultation, nonlabored breathing at rest. Cardiac: Regular rate and rhythm, no S3 or significant systolic murmur, no pericardial rub. Abdomen: Soft, nontender, bowel sounds present. Extremities: No pitting edema, distal pulses 2+. Skin: Warm and dry. Musculoskeletal: No kyphosis. Neuropsychiatric: Alert and oriented x3, affect grossly appropriate.   ECG: Tracing from 03/22/2014 reviewed finding sinus rhythm with prolonged PR interval and QT interval.  Recent Labwork: 10/16/2013: Magnesium 2.3 03/20/2014: ALT 90*; AST 85*; BUN 13; Creatinine 0.97; Hemoglobin 14.5; Platelets 195; Potassium 3.4*; Sodium 138     Component Value Date/Time   CHOL 201* 02/10/2014 1400   TRIG 117 02/10/2014 1400   HDL 41 02/10/2014 1400   CHOLHDL 4.9 02/10/2014 1400   VLDL 23 02/10/2014 1400   LDLCALC 137* 02/10/2014 1400    Other Studies Reviewed Today:  Echocardiogram 10/15/2013: Study Conclusions  - Left ventricle: The cavity size was normal. There was moderate concentric hypertrophy. Systolic function was vigorous. The estimated ejection fraction was in the range of 65% to 70%. There was dynamic obstruction at restin the mid cavity, with a peak velocity of 3 cm/sec and a peak gradient of 36 mm Hg. Wall motion was normal; there were no regional wall motion abnormalities. Doppler parameters  are consistent with abnormal left ventricular relaxation (grade 1 diastolic dysfunction).   ASSESSMENT AND PLAN:  1. PSVT, quiescent on present therapy including amiodarone, Lopressor, and aspirin. Check TSH and LFTs. Her AST and ALT were elevated back in February (although she also has a history of hepatitis C).  2. Essential hypertension. Blood pressure is mildly elevated today. No changes were made to current regimen.  3. Type 2 diabetes mellitus, followed by primary care.  Current medicines were reviewed at length with the patient today.   Orders Placed This Encounter  Procedures  . TSH  . Hepatic function panel  . EKG 12-Lead    Disposition: FU with me in 6 months.   Signed, Satira Sark, MD, [facility] 06/10/2014 1:34 PM    [facility] [address] [place], [place] [address] Phone: [phone]; Fax: [phone]

## 2014-06-10 NOTE — Patient Instructions (Signed)
Your physician wants you to follow-up in: 6 months with Dr. Domenic Polite.  You will receive a reminder letter in the mail two months in advance. If you don't receive a letter, please call our office to schedule the follow-up appointment.  Your physician recommends that you return for lab work in: (TSH, LFT)   Thank you for choosing [facility]!

## 2014-06-11 LAB — TSH: TSH: 1.44 u[IU]/mL (ref 0.350–4.500)

## 2014-06-11 LAB — HEPATIC FUNCTION PANEL
ALBUMIN: 3.8 g/dL (ref 3.5–5.2)
ALT: 129 U/L — ABNORMAL HIGH (ref 0–35)
AST: 139 U/L — ABNORMAL HIGH (ref 0–37)
Alkaline Phosphatase: 82 U/L (ref 39–117)
BILIRUBIN INDIRECT: 0.3 mg/dL (ref 0.2–1.2)
Bilirubin, Direct: 0.1 mg/dL (ref 0.0–0.3)
TOTAL PROTEIN: 8.3 g/dL (ref 6.0–8.3)
Total Bilirubin: 0.4 mg/dL (ref 0.2–1.2)

## 2014-06-15 ENCOUNTER — Telehealth: Payer: Self-pay

## 2014-06-15 MED ORDER — AMIODARONE HCL 100 MG PO TABS: 100.0000 mg | ORAL_TABLET | Freq: Every day | ORAL | Status: DC

## 2014-06-15 NOTE — Telephone Encounter (Signed)
-----   Message from Satira Sark, MD sent at 06/15/2014  9:05 AM EDT ----- Reviewed report. TSH is normal. AST and ALT remain elevated, 139 and 129 respectively at this point. Please have her decrease amiodarone to 100 mg daily. Forward results to primary care provider, as she also has a reported history of hepatitis and may need further evaluation.

## 2014-06-15 NOTE — Telephone Encounter (Signed)
Phone no answer,no machine, I have e-scribed decreased amiodarone 100 mg daily  Will attempt to reach patient later

## 2014-06-30 ENCOUNTER — Emergency Department (HOSPITAL_COMMUNITY)
Admission: EM | Admit: 2014-06-30 | Discharge: 2014-06-30 | Disposition: A | Discharge status: Home / Self Care | Payer: 59 | Attending: Emergency Medicine | Admitting: Emergency Medicine

## 2014-06-30 ENCOUNTER — Encounter (HOSPITAL_COMMUNITY): Payer: Self-pay | Admitting: Emergency Medicine

## 2014-06-30 ENCOUNTER — Emergency Department (HOSPITAL_COMMUNITY): Payer: 59

## 2014-06-30 DIAGNOSIS — Z7982 Long term (current) use of aspirin: Secondary | ICD-10-CM | POA: Insufficient documentation

## 2014-06-30 DIAGNOSIS — M1990 Unspecified osteoarthritis, unspecified site: Secondary | ICD-10-CM | POA: Insufficient documentation

## 2014-06-30 DIAGNOSIS — Z8719 Personal history of other diseases of the digestive system: Secondary | ICD-10-CM | POA: Diagnosis not present

## 2014-06-30 DIAGNOSIS — Z720 Tobacco use: Secondary | ICD-10-CM | POA: Insufficient documentation

## 2014-06-30 DIAGNOSIS — R197 Diarrhea, unspecified: Secondary | ICD-10-CM | POA: Insufficient documentation

## 2014-06-30 DIAGNOSIS — I10 Essential (primary) hypertension: Secondary | ICD-10-CM | POA: Insufficient documentation

## 2014-06-30 DIAGNOSIS — K029 Dental caries, unspecified: Secondary | ICD-10-CM | POA: Diagnosis not present

## 2014-06-30 DIAGNOSIS — G629 Polyneuropathy, unspecified: Secondary | ICD-10-CM | POA: Insufficient documentation

## 2014-06-30 DIAGNOSIS — J45909 Unspecified asthma, uncomplicated: Secondary | ICD-10-CM | POA: Diagnosis not present

## 2014-06-30 DIAGNOSIS — E119 Type 2 diabetes mellitus without complications: Secondary | ICD-10-CM | POA: Diagnosis not present

## 2014-06-30 DIAGNOSIS — Z85528 Personal history of other malignant neoplasm of kidney: Secondary | ICD-10-CM | POA: Diagnosis not present

## 2014-06-30 DIAGNOSIS — Z79899 Other long term (current) drug therapy: Secondary | ICD-10-CM | POA: Diagnosis not present

## 2014-06-30 DIAGNOSIS — Z8619 Personal history of other infectious and parasitic diseases: Secondary | ICD-10-CM | POA: Diagnosis not present

## 2014-06-30 DIAGNOSIS — R079 Chest pain, unspecified: Secondary | ICD-10-CM

## 2014-06-30 DIAGNOSIS — M792 Neuralgia and neuritis, unspecified: Secondary | ICD-10-CM

## 2014-06-30 DIAGNOSIS — Z794 Long term (current) use of insulin: Secondary | ICD-10-CM | POA: Insufficient documentation

## 2014-06-30 DIAGNOSIS — R0789 Other chest pain: Secondary | ICD-10-CM | POA: Diagnosis not present

## 2014-06-30 LAB — COMPREHENSIVE METABOLIC PANEL
ALBUMIN: 3.8 g/dL (ref 3.5–5.0)
ALT: 113 U/L — ABNORMAL HIGH (ref 14–54)
AST: 116 U/L — AB (ref 15–41)
Alkaline Phosphatase: 79 U/L (ref 38–126)
Anion gap: 12 (ref 5–15)
BUN: 14 mg/dL (ref 6–20)
CALCIUM: 9.4 mg/dL (ref 8.9–10.3)
CO2: 30 mmol/L (ref 22–32)
Chloride: 93 mmol/L — ABNORMAL LOW (ref 101–111)
Creatinine, Ser: 1.08 mg/dL — ABNORMAL HIGH (ref 0.44–1.00)
GFR calc non Af Amer: 53 mL/min — ABNORMAL LOW (ref >60)
Glucose, Bld: 200 mg/dL — ABNORMAL HIGH (ref 65–99)
POTASSIUM: 4.5 mmol/L (ref 3.5–5.1)
SODIUM: 135 mmol/L (ref 135–145)
Total Bilirubin: 0.8 mg/dL (ref 0.3–1.2)
Total Protein: 8.3 g/dL — ABNORMAL HIGH (ref 6.5–8.1)

## 2014-06-30 LAB — CBC WITH DIFFERENTIAL/PLATELET
BASOS PCT: 0 % (ref 0–1)
Basophils Absolute: 0 10*3/uL (ref 0.0–0.1)
EOS PCT: 1 % (ref 0–5)
Eosinophils Absolute: 0.1 10*3/uL (ref 0.0–0.7)
HEMATOCRIT: 44.1 % (ref 36.0–46.0)
HEMOGLOBIN: 14.7 g/dL (ref 12.0–15.0)
LYMPHS ABS: 6.1 10*3/uL — AB (ref 0.7–4.0)
Lymphocytes Relative: 54 % — ABNORMAL HIGH (ref 12–46)
MCH: 30.2 pg (ref 26.0–34.0)
MCHC: 33.3 g/dL (ref 30.0–36.0)
MCV: 90.7 fL (ref 78.0–100.0)
Monocytes Absolute: 0.9 10*3/uL (ref 0.1–1.0)
Monocytes Relative: 8 % (ref 3–12)
NEUTROS ABS: 4.2 10*3/uL (ref 1.7–7.7)
Neutrophils Relative %: 37 % — ABNORMAL LOW (ref 43–77)
Platelets: 172 10*3/uL (ref 150–400)
RBC: 4.86 MIL/uL (ref 3.87–5.11)
RDW: 13.2 % (ref 11.5–15.5)
WBC: 11.3 10*3/uL — AB (ref 4.0–10.5)

## 2014-06-30 LAB — URINALYSIS, ROUTINE W REFLEX MICROSCOPIC
BILIRUBIN URINE: NEGATIVE
GLUCOSE, UA: NEGATIVE mg/dL
KETONES UR: NEGATIVE mg/dL
Nitrite: NEGATIVE
PH: 5.5 (ref 5.0–8.0)
Protein, ur: 100 mg/dL — AB
Specific Gravity, Urine: 1.025 (ref 1.005–1.030)
UROBILINOGEN UA: 0.2 mg/dL (ref 0.0–1.0)

## 2014-06-30 LAB — URINE MICROSCOPIC-ADD ON

## 2014-06-30 LAB — TROPONIN I

## 2014-06-30 LAB — CBG MONITORING, ED: Glucose-Capillary: 183 mg/dL — ABNORMAL HIGH (ref 65–99)

## 2014-06-30 MED ORDER — ONDANSETRON 4 MG PO TBDP
4.0000 mg | ORAL_TABLET | Freq: Once | ORAL | Status: AC
  Administered 2014-06-30: 4 mg via ORAL
  Filled 2014-06-30: qty 1

## 2014-06-30 MED ORDER — HYDROCODONE-ACETAMINOPHEN 5-325 MG PO TABS
2.0000 | ORAL_TABLET | Freq: Once | ORAL | Status: AC
  Administered 2014-06-30: 2 via ORAL
  Filled 2014-06-30: qty 2

## 2014-06-30 MED ORDER — PENICILLIN V POTASSIUM 500 MG PO TABS: 500.0000 mg | ORAL_TABLET | Freq: Four times a day (QID) | ORAL | Status: DC

## 2014-06-30 MED ORDER — PENICILLIN V POTASSIUM 250 MG PO TABS
500.0000 mg | ORAL_TABLET | Freq: Once | ORAL | Status: AC
  Administered 2014-06-30: 500 mg via ORAL
  Filled 2014-06-30: qty 2

## 2014-06-30 NOTE — ED Provider Notes (Signed)
TIME SEEN: 6:50 PM  CHIEF COMPLAINT: Multiple complaints  HPI: Pt is a 64 y.o. female with history of hypertension, diabetes, hyperlipidemia, renal cell carcinoma status post right-sided nephrectomy, neuropathy, hepatitis C presents emergency department with multiple complaints. Patient reports she has had nausea and diarrhea yesterday yesterday. She has had 3 total episodes of diarrhea without blood or black and tarry stools. She's not had vomiting. Reports right lower quadrant pain. No dysuria or hematuria. Also complaining of 2 years of bilateral lower extremity pain from her neuropathy. Is on gabapentin but reports this is not helping. Has not followed up with her PCP for this. No injury to her legs. No swelling. No new numbness or focal weakness. Also complaining of left sided jaw pain that she thinks is from her teeth for the past 3 days. Reports she does not have a dentist. No facial swelling. No difficulty opening her mouth or swallowing. No fever. States she has had sharp left-sided chest pain shortness of breath but none currently.  ROS: See HPI Constitutional: no fever  Eyes: no drainage  ENT: no runny nose   Cardiovascular:   chest pain  Resp:  SOB  GI: no vomiting GU: no dysuria Integumentary: no rash  Allergy: no hives  Musculoskeletal: no leg swelling  Neurological: no slurred speech ROS otherwise negative  PAST MEDICAL HISTORY/PAST SURGICAL HISTORY:  Past Medical History  Diagnosis Date  . Type 2 diabetes mellitus   . Essential hypertension   . Asthma   . Neuropathic pain   . Hepatitis C   . Renal carcinoma   . Hyperlipidemia   . Arthritis   . Rash   . GERD (gastroesophageal reflux disease)   . PSVT (paroxysmal supraventricular tachycardia)     MEDICATIONS:  Prior to Admission medications   Medication Sig Start Date End Date Taking? Authorizing Provider  amiodarone (PACERONE) 100 MG tablet Take 1 tablet (100 mg total) by mouth daily. 06/15/14   Satira Sark,  MD  aspirin EC 81 MG tablet Take 81 mg by mouth daily.    Historical Provider, MD  insulin detemir (LEVEMIR) 100 UNIT/ML injection Inject 25 Units into the skin 2 (two) times daily.     Historical Provider, MD  lisinopril-hydrochlorothiazide (PRINZIDE,ZESTORETIC) 20-12.5 MG per tablet Take 1 tablet by mouth every morning.    Historical Provider, MD  metFORMIN (GLUCOPHAGE) 500 MG tablet Take 500 mg by mouth 2 (two) times daily with a meal.    Historical Provider, MD  metoprolol tartrate (LOPRESSOR) 25 MG tablet Take 1 tablet (25 mg total) by mouth 2 (two) times daily. 11/04/13   Imogene Burn, PA-C  oxyCODONE-acetaminophen (ROXICET) 5-325 MG per tablet Take 1-2 tablets by mouth every 6 (six) hours as needed for moderate pain or severe pain. Post-operaitvely 10/20/13   [name], MD  predniSONE (DELTASONE) 10 MG tablet Take 2 tablets (20 mg total) by mouth 2 (two) times daily with a meal. 06/02/14   Hope Bunnie Pion, NP  pregabalin (LYRICA) 75 MG capsule Take 75 mg by mouth 3 (three) times daily.     Historical Provider, MD  senna-docusate (SENOKOT-S) 8.6-50 MG per tablet Take 1 tablet by mouth 2 (two) times daily. While taking pain meds to prevent constipation 10/20/13   [name], MD    ALLERGIES:  Allergies  Allergen Reactions  . Sulfa Antibiotics Itching and Other (See Comments)    burning    SOCIAL HISTORY:  History  Substance Use Topics  . Smoking status: Current  Some Day Smoker -- 0.00 packs/day for 20 years    Types: Cigarettes  . Smokeless tobacco: Never Used  . Alcohol Use: 0.0 oz/week    0 Standard drinks or equivalent per week     Comment: trying to stop    FAMILY HISTORY: Family History  Problem Relation Age of Onset  . Colon cancer Sister 32    EXAM: BP 188/95 mmHg  Pulse 81  Temp(Src) 98 F (36.7 C)  Resp 18  Ht 5' (1.524 m)  Wt 156 lb (70.761 kg)  BMI 30.47 kg/m2  SpO2 97% CONSTITUTIONAL: Alert and oriented and responds appropriately to questions.  Well-appearing; well-nourished, nontoxic, in no distress HEAD: Normocephalic EYES: Conjunctivae clear, PERRL ENT: normal nose; no rhinorrhea; moist mucous membranes; pharynx without lesions noted, multiple dental caries without obvious sign of abscess, no facial swelling, no trismus or drooling, no uvular deviation, no tonsillar hypertrophy or exudate, no Ludwig angina, normal phonation, no stridor NECK: Supple, no meningismus, no LAD  CARD: RRR; S1 and S2 appreciated; no murmurs, no clicks, no rubs, no gallops RESP: Normal chest excursion without splinting or tachypnea; breath sounds clear and equal bilaterally; no wheezes, no rhonchi, no rales, no hypoxia or respiratory distress, speaking full sentences ABD/GI: Normal bowel sounds; non-distended; soft, non-tender, no rebound, no guarding, no peritoneal signs, no tenderness at McBurney's point BACK:  The back appears normal and is non-tender to palpation, there is no CVA tenderness EXT: Normal ROM in all joints; non-tender to palpation; no edema; normal capillary refill; no cyanosis, no calf tenderness or swelling    SKIN: Normal color for age and race; warm NEURO: Moves all extremities equally, sensation to light touch intact diffusely, cranial nerves II through XII intact PSYCH: The patient's mood and manner are appropriate. Grooming and personal hygiene are appropriate.  MEDICAL DECISION MAKING: Patient here with multiple complaints. We'll obtain labs, urine. Her abdominal exam is benign. She states she's had 3 episodes of diarrhea in the past 24 hours. She appeared dehydrated on exam. She is also complaining of dental pain and has dental caries without obvious abscess. Will put on prophylactic penicillin have advised her to follow-up with a dentist. Given she is having some atypical chest pain or shortness of breath with this I will obtain EKG, chest x-ray and troponin. She is also complaining of lower extremity neuropathy which is chronic. Have  advised her to follow-up with her primary care physician for further management for this chronic pain.  ED PROGRESS: Patient's labs show atypical lymphocytes and stomatocytes. Have advised her to follow-up with her primary care physician for this. Pathology smear is pending. Mild elevation of her AST and ALT with no right upper quadrant pain on exam. Urine shows hemoglobin, leukocytes, many bacteria but also many squamous cells. Suspect dirty catch.  Discussed close outpatient follow-up for management for her neuropathy and dental follow-up for her dental caries. Will discharge on penicillin. Discussed return precautions. She verbalized understanding and is comfortable with plan.      EKG Interpretation  Date/Time:  Wednesday June 30 2014 19:23:14 EDT Ventricular Rate:  63 PR Interval:  204 QRS Duration: 79 QT Interval:  497 QTC Calculation: 509 R Axis:   20 Text Interpretation:  Sinus rhythm Prolonged QT interval Baseline wander in lead(s) V1 No significant change since last tracing Confirmed by WARD,  DO, KRISTEN [phone]) on 06/30/2014 7:31:55 PM        Bearden, DO 06/30/14 2055

## 2014-06-30 NOTE — Discharge Instructions (Signed)
You did have some abnormal white blood cells in your blood work today. You'll need to follow up with your primary care physician who can follow this.  He will need to follow up with a dentist to treat your dental pain. You will also need to follow-up with her primary care physician to manage your pain from your neuropathy.   Dental Caries Dental caries (also called tooth decay) is the most common oral disease. It can occur at any age but is more common in children and young adults.  HOW DENTAL CARIES DEVELOPS  The process of decay begins when bacteria and foods (particularly sugars and starches) combine in your mouth to produce plaque. Plaque is a substance that sticks to the hard, outer surface of a tooth (enamel). The bacteria in plaque produce acids that attack enamel. These acids may also attack the root surface of a tooth (cementum) if it is exposed. Repeated attacks dissolve these surfaces and create holes in the tooth (cavities). If left untreated, the acids destroy the other layers of the tooth.  RISK FACTORS  Frequent sipping of sugary beverages.   Frequent snacking on sugary and starchy foods, especially those that easily get stuck in the teeth.   Poor oral hygiene.   Dry mouth.   Substance abuse such as methamphetamine abuse.   Broken or poor-fitting dental restorations.   Eating disorders.   Gastroesophageal reflux disease (GERD).   Certain radiation treatments to the head and neck. SYMPTOMS In the early stages of dental caries, symptoms are seldom present. Sometimes white, chalky areas may be seen on the enamel or other tooth layers. In later stages, symptoms may include:  Pits and holes on the enamel.  Toothache after sweet, hot, or cold foods or drinks are consumed.  Pain around the tooth.  Swelling around the tooth. DIAGNOSIS  Most of the time, dental caries is detected during a regular dental checkup. A diagnosis is made after a thorough medical and  dental history is taken and the surfaces of your teeth are checked for signs of dental caries. Sometimes special instruments, such as lasers, are used to check for dental caries. Dental X-ray exams may be taken so that areas not visible to the eye (such as between the contact areas of the teeth) can be checked for cavities.  TREATMENT  If dental caries is in its early stages, it may be reversed with a fluoride treatment or an application of a remineralizing agent at the dental office. Thorough brushing and flossing at home is needed to aid these treatments. If it is in its later stages, treatment depends on the location and extent of tooth destruction:   If a small area of the tooth has been destroyed, the destroyed area will be removed and cavities will be filled with a material such as gold, silver amalgam, or composite resin.   If a large area of the tooth has been destroyed, the destroyed area will be removed and a cap (crown) will be fitted over the remaining tooth structure.   If the center part of the tooth (pulp) is affected, a procedure called a root canal will be needed before a filling or crown can be placed.   If most of the tooth has been destroyed, the tooth may need to be pulled (extracted). HOME CARE INSTRUCTIONS You can prevent, stop, or reverse dental caries at home by practicing good oral hygiene. Good oral hygiene includes:  Thoroughly cleaning your teeth at least twice a day with a  toothbrush and dental floss.   Using a fluoride toothpaste. A fluoride mouth rinse may also be used if recommended by your dentist or health care provider.   Restricting the amount of sugary and starchy foods and sugary liquids you consume.   Avoiding frequent snacking on these foods and sipping of these liquids.   Keeping regular visits with a dentist for checkups and cleanings. PREVENTION   Practice good oral hygiene.  Consider a dental sealant. A dental sealant is a coating  material that is applied by your dentist to the pits and grooves of teeth. The sealant prevents food from being trapped in them. It may protect the teeth for several years.  Ask about fluoride supplements if you live in a community without fluorinated water or with water that has a low fluoride content. Use fluoride supplements as directed by your dentist or health care provider.  Allow fluoride varnish applications to teeth if directed by your dentist or health care provider. Document Released: 09/30/2001 Document Revised: 05/25/2013 Document Reviewed: 01/11/2012 [facility] Patient Information 2015 [place], [place]. This information is not intended to replace advice given to you by your health care provider. Make sure you discuss any questions you have with your health care provider.  Diarrhea Diarrhea is frequent loose and watery bowel movements. It can cause you to feel weak and dehydrated. Dehydration can cause you to become tired and thirsty, have a dry mouth, and have decreased urination that often is dark yellow. Diarrhea is a sign of another problem, most often an infection that will not last long. In most cases, diarrhea typically lasts 2-3 days. However, it can last longer if it is a sign of something more serious. It is important to treat your diarrhea as directed by your caregiver to lessen or prevent future episodes of diarrhea. CAUSES  Some common causes include:  Gastrointestinal infections caused by viruses, bacteria, or parasites.  Food poisoning or food allergies.  Certain medicines, such as antibiotics, chemotherapy, and laxatives.  Artificial sweeteners and fructose.  Digestive disorders. HOME CARE INSTRUCTIONS  Ensure adequate fluid intake (hydration): Have 1 cup (8 oz) of fluid for each diarrhea episode. Avoid fluids that contain simple sugars or sports drinks, fruit juices, whole milk products, and sodas. Your urine should be clear or pale yellow if you are drinking enough  fluids. Hydrate with an oral rehydration solution that you can purchase at pharmacies, retail stores, and online. You can prepare an oral rehydration solution at home by mixing the following ingredients together:   - tsp table salt.   tsp baking soda.   tsp salt substitute containing potassium chloride.  1  tablespoons sugar.  1 L (34 oz) of water.  Certain foods and beverages may increase the speed at which food moves through the gastrointestinal (GI) tract. These foods and beverages should be avoided and include:  Caffeinated and alcoholic beverages.  High-fiber foods, such as raw fruits and vegetables, nuts, seeds, and whole grain breads and cereals.  Foods and beverages sweetened with sugar alcohols, such as xylitol, sorbitol, and mannitol.  Some foods may be well tolerated and may help thicken stool including:  Starchy foods, such as rice, toast, pasta, low-sugar cereal, oatmeal, grits, baked potatoes, crackers, and bagels.  Bananas.  Applesauce.  Add probiotic-rich foods to help increase healthy bacteria in the GI tract, such as yogurt and fermented milk products.  Wash your hands well after each diarrhea episode.  Only take over-the-counter or prescription medicines as directed by your caregiver.  Take a warm bath to relieve any burning or pain from frequent diarrhea episodes. SEEK IMMEDIATE MEDICAL CARE IF:   You are unable to keep fluids down.  You have persistent vomiting.  You have blood in your stool, or your stools are black and tarry.  You do not urinate in 6-8 hours, or there is only a small amount of very dark urine.  You have abdominal pain that increases or localizes.  You have weakness, dizziness, confusion, or light-headedness.  You have a severe headache.  Your diarrhea gets worse or does not get better.  You have a fever or persistent symptoms for more than 2-3 days.  You have a fever and your symptoms suddenly get worse. MAKE SURE YOU:     Understand these instructions.  Will watch your condition.  Will get help right away if you are not doing well or get worse. Document Released: 12/29/2001 Document Revised: 05/25/2013 Document Reviewed: 09/16/2011 [facility] Patient Information 2015 [place], [place]. This information is not intended to replace advice given to you by your health care provider. Make sure you discuss any questions you have with your health care provider.  Neuropathic Pain We often think that pain has a physical cause. If we get rid of the cause, the pain should go away. Nerves themselves can also cause pain. It is called neuropathic pain, which means nerve abnormality. It may be difficult for the patients who have it and for the treating caregivers. Pain is usually described as acute (short-lived) or chronic (long-lasting). Acute pain is related to the physical sensations caused by an injury. It can last from a few seconds to many weeks, but it usually goes away when normal healing occurs. Chronic pain lasts beyond the typical healing time. With neuropathic pain, the nerve fibers themselves may be damaged or injured. They then send incorrect signals to other pain centers. The pain you feel is real, but the cause is not easy to find.  CAUSES  Chronic pain can result from diseases, such as diabetes and shingles (an infection related to chickenpox), or from trauma, surgery, or amputation. It can also happen without any known injury or disease. The nerves are sending pain messages, even though there is no identifiable cause for such messages.   Other common causes of neuropathy include diabetes, phantom limb pain, or Regional Pain Syndrome (RPS).  As with all forms of chronic back pain, if neuropathy is not correctly treated, there can be a number of associated problems that lead to a downward cycle for the patient. These include depression, sleeplessness, feelings of fear and anxiety, limited social interaction and  inability to do normal daily activities or work.  The most dramatic and mysterious example of neuropathic pain is called "phantom limb syndrome." This occurs when an arm or a leg has been removed because of illness or injury. The brain still gets pain messages from the nerves that originally carried impulses from the missing limb. These nerves now seem to misfire and cause troubling pain.  Neuropathic pain often seems to have no cause. It responds poorly to standard pain treatment. Neuropathic pain can occur after:  Shingles (herpes zoster virus infection).  A lasting burning sensation of the skin, caused usually by injury to a peripheral nerve.  Peripheral neuropathy which is widespread nerve damage, often caused by diabetes or alcoholism.  Phantom limb pain following an amputation.  Facial nerve problems (trigeminal neuralgia).  Multiple sclerosis.  Reflex sympathetic dystrophy.  Pain which comes with cancer and cancer  chemotherapy.  Entrapment neuropathy such as when pressure is put on a nerve such as in carpal tunnel syndrome.  Back, leg, and hip problems (sciatica).  Spine or back surgery.  HIV Infection or AIDS where nerves are infected by viruses. Your caregiver can explain items in the above list which may apply to you. SYMPTOMS  Characteristics of neuropathic pain are:  Severe, sharp, electric shock-like, shooting, lightening-like, knife-like.  Pins and needles sensation.  Deep burning, deep cold, or deep ache.  Persistent numbness, tingling, or weakness.  Pain resulting from light touch or other stimulus that would not usually cause pain.  Increased sensitivity to something that would normally cause pain, such as a pinprick. Pain may persist for months or years following the healing of damaged tissues. When this happens, pain signals no longer sound an alarm about current injuries or injuries about to happen. Instead, the alarm system itself is not working  correctly.  Neuropathic pain may get worse instead of better over time. For some people, it can lead to serious disability. It is important to be aware that severe injury in a limb can occur without a proper, protective pain response.Burns, cuts, and other injuries may go unnoticed. Without proper treatment, these injuries can become infected or lead to further disability. Take any injury seriously, and consult your caregiver for treatment. DIAGNOSIS  When you have a pain with no known cause, your caregiver will probably ask some specific questions:   Do you have any other conditions, such as diabetes, shingles, multiple sclerosis, or HIV infection?  How would you describe your pain? (Neuropathic pain is often described as shooting, stabbing, burning, or searing.)  Is your pain worse at any time of the day? (Neuropathic pain is usually worse at night.)  Does the pain seem to follow a certain physical pathway?  Does the pain come from an area that has missing or injured nerves? (An example would be phantom limb pain.)  Is the pain triggered by minor things such as rubbing against the sheets at night? These questions often help define the type of pain involved. Once your caregiver knows what is happening, treatment can begin. Anticonvulsant, antidepressant drugs, and various pain relievers seem to work in some cases. If another condition, such as diabetes is involved, better management of that disorder may relieve the neuropathic pain.  TREATMENT  Neuropathic pain is frequently long-lasting and tends not to respond to treatment with narcotic type pain medication. It may respond well to other drugs such as antiseizure and antidepressant medications. Usually, neuropathic problems do not completely go away, but partial improvement is often possible with proper treatment. Your caregivers have large numbers of medications available to treat you. Do not be discouraged if you do not get immediate relief.  Sometimes different medications or a combination of medications will be tried before you receive the results you are hoping for. See your caregiver if you have pain that seems to be coming from nowhere and does not go away. Help is available.  SEEK IMMEDIATE MEDICAL CARE IF:   There is a sudden change in the quality of your pain, especially if the change is on only one side of the body.  You notice changes of the skin, such as redness, black or purple discoloration, swelling, or an ulcer.  You cannot move the affected limbs. Document Released: 10/06/2003 Document Revised: 04/02/2011 Document Reviewed: 10/06/2003 [facility] Patient Information 2015 [place], [place]. This information is not intended to replace advice given to you by your  health care provider. Make sure you discuss any questions you have with your health care provider.    Emergency Department Resource Guide 1) Find a Doctor and Pay Out of Pocket Although you won't have to find out who is covered by your insurance plan, it is a good idea to ask around and get recommendations. You will then need to call the office and see if the doctor you have chosen will accept you as a new patient and what types of options they offer for patients who are self-pay. Some doctors offer discounts or will set up payment plans for their patients who do not have insurance, but you will need to ask so you aren't surprised when you get to your appointment.  2) Contact Your Local Health Department Not all health departments have doctors that can see patients for sick visits, but many do, so it is worth a call to see if yours does. If you don't know where your local health department is, you can check in your phone book. The CDC also has a tool to help you locate your state's health department, and many state websites also have listings of all of their local health departments.  3) Find a [facility] If your illness is not likely to be very severe or  complicated, you may want to try a walk in clinic. These are popping up all over the country in pharmacies, drugstores, and shopping centers. They're usually staffed by nurse practitioners or physician assistants that have been trained to treat common illnesses and complaints. They're usually fairly quick and inexpensive. However, if you have serious medical issues or chronic medical problems, these are probably not your best option.  No Primary Care Doctor: - Call Health Connect at  [phone] - they can help you locate a primary care doctor that  accepts your insurance, provides certain services, etc. - Physician Referral Service- [phone]  Chronic Pain Problems: Organization         Address  Phone   Notes  [facility]  [phone] Patients need to be referred by their primary care doctor.   Medication Assistance: Organization         Address  Phone   Notes  [facility] Medication [facility] [address] [place], [place] [address] [phone] --Must be a resident of [facility] -- Must have NO insurance coverage whatsoever (no Medicaid/ Medicare, etc.) -- The pt. MUST have a primary care doctor that directs their care regularly and follows them in the community   [facility]ist  [phone]   [employer]  [phone]    Agencies that provide inexpensive medical care: Organization         Address  Phone   Notes  [place]  [phone]   [facility]    [phone]   [facility] [place], [place] [address] [phone]   [place] [address], [place] [phone]   Planned Parenthood    [phone]   [facility]    [phone]   [place] and [place] [address], [place] Phone:  [phone], Fax:  [phone] Hours of Operation:  9 am - 6 pm, M-F.  Also accepts  Medicaid/Medicare and self-pay.  [facility] for [place] [address], [address], [place] Phone: [phone], Fax: [phone]. Hours  of Operation:  8:30 am - 5:30 pm, M-F.  Also accepts Medicaid and self-pay.  [facility] [place] [address], [place] Phone: [phone]   [place], [place], [place] [phone], Ext. [phone] Mondays & Thursdays: 7-9 AM.  First 15 patients are seen on a first come, first serve basis.    [place] Providers:  Organization         Address  Phone   Notes  [facility] [address], [place] [phone] Also accepts self-pay patients.  [facility] [address], [place]  [phone]   [place], [address], [place] [phone]   [facility] [address] [place] [phone]   Lucianne Lei [address], [place]   [phone] Only accepts [place] Access [place] patients after they have their name applied to their card.   Self-Pay (no insurance) in [facility]:  Organization         Address  Phone   Notes  Sickle Cell Patients, [facility] [place] [phone]   [facility] [place] [phone]   [facility] [place]  [place], [place], [place] [phone]   [facility]/Dr. Osei-Bonsu  [address] [place] or [address], [place] [phone] Phone number for both [place] and [place] locations is the same.  Urgent Medical and [facility] [address], [place] [phone]   [facility] [address] [place] or [address] [phone]   [name] [address] [place] [phone], phone; [phone], fax Sees patients 1st and 3rd Saturday of every month.  Must not qualify for public or private insurance (i.e. Medicaid, Medicare, [place] Health Choice, Veterans' Benefits)  Household income should be no more than 200% of the poverty level The clinic cannot treat you if you are pregnant or think you are pregnant  Sexually transmitted diseases are not treated at the clinic.    Dental Care: Organization         Address  Phone  Notes  [facility] of [facility] [phone] Accepts children up to age 48 who are enrolled in [place] or [place]; pregnant women with a Medicaid card; and children who have applied for Medicaid or [place] Health Choice, but were declined, whose parents can pay a reduced fee at time of service.  [facility] Department of [facility]  [address], [place] [phone] Accepts children up to age 38 who are enrolled in [place] or [place]; pregnant women with a Medicaid card; and children who have applied for Medicaid or [place] Health Choice, but were declined, whose parents can pay a reduced fee at time of service.  [place] Adult Dental Access PROGRAM  [place] [phone] Patients are seen by appointment only. Walk-ins are not accepted. [place] will see patients 35 years of age and older. Monday - Tuesday (8am-5pm) Most Wednesdays (8:30-5pm) $30 per visit, cash only  [facility] Adult Dental Access PROGRAM  [address], [facility] [phone] Patients are seen  by appointment only. Walk-ins are not accepted. [place] will see patients 51 years of age and older. One Wednesday Evening (Monthly: Volunteer Based).  $30 per visit, cash only  [place]  [phone] for adults; Children under age 61, call Graduate Pediatric Dentistry at [phone]. Children aged  7-14, please call [phone] to request a pediatric application.  Dental services are provided in all areas of dental care including fillings, crowns and bridges, complete and partial dentures, implants, gum treatment, root canals, and extractions. Preventive care is also provided. Treatment is provided to both adults and children. Patients are selected via a lottery and there is often a waiting list.   [facility] [address] [place]  [phone] [email or web address]   [facility] [address] [place], [place] [phone], Ext. [phone] Second and Fourth Thursday of each month, opens at 6:30 AM; Clinic ends at 9 AM.  Patients are seen on a first-come first-served basis, and a limited number are seen during each clinic.   [facility]  [address] Hillard Danker [place], [place] [phone]   Eligibility Requirements You must have lived in [place], [place], or [place] counties for at least the last three months.   You cannot be eligible for state or federal sponsored [employer], including [employer], [place], or [employer].   You generally cannot be eligible for healthcare insurance through your employer.    How to apply: Eligibility screenings are held every Tuesday and Wednesday afternoon from 1:00 pm until 4:00 pm. You do not need an appointment for the interview!  [facility] [address], [place], [place]   [place]  [place] Department  [place]  [phone]    Behavioral Health Resources in the Community: Intensive Outpatient Programs Organization         Address  Phone  Notes  [place] [facility]. [address] [place], [place] [phone]   [facility] Outpatient [address] [place], [place]   ADS: Alcohol & Drug Svcs [address]  [place], [place]   [place] [address]  [place], [place] or [phone]   Substance Abuse Resources Organization         Address  Phone  Notes  Alcohol and Drug Services  [phone]   [place]  [phone]   [facility] [place]   Chinita Pester  [phone]   Residential & Outpatient Substance Abuse Program  [phone]   Psychological Services Organization         Address  Phone  Notes  [facility]  [place]  [phone]   [place] [address] [place] or [phone]    Mobile Crisis Teams Organization         Address  Phone  Notes  [facility], Mobile Crisis Care Unit  [phone]   Assertive Psychotherapeutic Services  [address] [place], [place]   [place] [address], [place] [phone]    Self-Help/Support Groups Organization         Address  Phone             Notes  [place]. of [place] - variety of support groups  [place] [facility] for more  information  Narcotics Anonymous (NA), [facility] [address], [place]  2 meetings at this location   Residential Facilities manager         Address  Phone  Notes  [facility] [place],    [place]  [phone]   [facility]  [address], [place] [address], [place], [place]   [name], [place] [phone] Admissions: 8am-3pm M-F  [facility] [place] [address]    [place], [place] [phone]   [facility] [address] [place], [place], [place]   [facility] [address],  [place], [place]   [facility] Intensive Outpatient [address], [place], [place]   [facility]  ([address] [place]  [place], [place] [phone] or [phone]   [facility] [address] [place], [place] Accepts Medicaid  [facility] [address],  [place] [phone] Substance Abuse/Addiction Treatment   [facility]         Address  Phone  Notes  [facility]  [phone]   Domenic Schwab, PhD [address] [place], [place]   [phone] or [phone]   [facility] [place], [place] [phone]   [facility] [address] [place], [place] [phone] Insurance/Medicaid/sponsorship through [facility] [address]                                    [place], [place] [phone] [place] [address], [place] [phone]    Dr. Adele Schilder  [phone]   [facility] Dept. 1) [address], [place] 2) [place] 3)  [place] [address], [place] [phone]  [phone]   [place] [phone] or [phone] (After Hours)

## 2014-06-30 NOTE — ED Notes (Signed)
Pt c/o generalized hip pain, as well as dental pain.  Medicated for pain, nausea and antibiotics given.

## 2014-06-30 NOTE — ED Notes (Signed)
Pt reports diarrhea, generalized weakness, tooth pain since yesterday. nad noted.

## 2014-06-30 NOTE — ED Notes (Addendum)
Pt c/o jaw pain and diarrhea  since yesterday.  States her jaw pain is coming from her teeth.   C/o neuropathy pain in her feet.  And states "now my right side is hurting"

## 2014-07-01 LAB — PATHOLOGIST SMEAR REVIEW

## 2014-08-24 ENCOUNTER — Telehealth: Payer: Self-pay | Admitting: Gastroenterology

## 2014-08-24 NOTE — Telephone Encounter (Signed)
TRIED TO CALL PATIENT. NEEDS REFERRAL FROM HER INSURANCE.

## 2014-08-30 ENCOUNTER — Ambulatory Visit: Payer: 59 | Admitting: Gastroenterology

## 2014-09-22 ENCOUNTER — Encounter: Payer: Self-pay | Admitting: Gastroenterology

## 2014-09-22 ENCOUNTER — Telehealth: Payer: Self-pay | Admitting: Gastroenterology

## 2014-09-22 ENCOUNTER — Ambulatory Visit: Payer: 59 | Admitting: Gastroenterology

## 2014-09-22 NOTE — Telephone Encounter (Signed)
PATIENT WAS A NO SHOW AND LETTER SENT  °

## 2014-10-01 ENCOUNTER — Ambulatory Visit (INDEPENDENT_AMBULATORY_CARE_PROVIDER_SITE_OTHER): Payer: Medicare Other | Admitting: Adult Health

## 2014-10-01 ENCOUNTER — Encounter: Payer: Self-pay | Admitting: Adult Health

## 2014-10-01 VITALS — BP 176/98 | HR 80 | Ht 60.0 in | Wt 155.5 lb

## 2014-10-01 DIAGNOSIS — N898 Other specified noninflammatory disorders of vagina: Secondary | ICD-10-CM | POA: Diagnosis not present

## 2014-10-01 DIAGNOSIS — B373 Candidiasis of vulva and vagina: Secondary | ICD-10-CM | POA: Diagnosis not present

## 2014-10-01 DIAGNOSIS — B3731 Acute candidiasis of vulva and vagina: Secondary | ICD-10-CM

## 2014-10-01 DIAGNOSIS — L298 Other pruritus: Secondary | ICD-10-CM | POA: Diagnosis not present

## 2014-10-01 HISTORY — DX: Candidiasis of vulva and vagina: B37.3

## 2014-10-01 HISTORY — DX: Acute candidiasis of vulva and vagina: B37.31

## 2014-10-01 HISTORY — DX: Other specified noninflammatory disorders of vagina: N89.8

## 2014-10-01 LAB — POCT WET PREP (WET MOUNT): WBC, Wet Prep HPF POC: POSITIVE

## 2014-10-01 MED ORDER — FLUCONAZOLE 150 MG PO TABS: ORAL_TABLET | ORAL | Status: DC

## 2014-10-01 MED ORDER — TERCONAZOLE 0.4 % VA CREA: 1.0000 | TOPICAL_CREAM | Freq: Every day | VAGINAL | Status: DC

## 2014-10-01 NOTE — Addendum Note (Signed)
Addended by: Derrek Monaco A on: 10/01/2014 02:25 PM   Modules accepted: Orders, Medications

## 2014-10-01 NOTE — Progress Notes (Addendum)
Subjective:     Patient ID: Cassandra Jordan, female   DOB: [birth year], 64 y.o.   MRN: [id number]  HPI Cassandra Jordan is a 64 year old black female in complaining of vaginal itching and irritation for 1 week, has no new products, has not had sex lately.She does says her sugars have been up and was on antibiotic about a month ago, her doctor is in [place].She has history of warts,does not think has any now.  Review of Systems Patient denies any headaches, hearing loss, fatigue, blurred vision, shortness of breath, chest pain, abdominal pain, problems with bowel movements, urination, or intercourse. No joint pain or mood swings.See HPI for positives.  Reviewed past medical,surgical, social and family history. Reviewed medications and allergies.     Objective:   Physical Exam BP 176/98 mmHg  Pulse 80  Ht 5' (1.524 m)  Wt 155 lb 8 oz (70.534 kg)  BMI 30.37 kg/m2   Skin warm and dry.Pelvic: external genitalia is excoriated and has areas where skin pale pink to whitish,where she had warts removed in [facility], vagina: white discharge without odor, has some red side walls, and some tenderness,urethra has no lesions or masses noted, cervix and uterus are absent, adnexa: no masses or tenderness noted. Bladder is non tender and no masses felt. Wet prep: + for yeast and +WBCs.Painted vagina and external tissue with gentian violet,will rx diflucan. Face time 20 minutes with pr with 50% time counseling.  Assessment:     Vaginal itching Vaginal irritation Yeast infection    Plan:     Rx diflucan 150 mg #2 take 1 now and 1 in 3 days with 1 refill Follow up in 1 week Review handout on yeast No sex Pat Dry, do not scratch or rub hard Follow with PCP about diabetes and BP      [facility] taking Pacerone and did not tell us, has contraindication with diflucan so cancelled it and ordered terazol 7 cream

## 2014-10-01 NOTE — Patient Instructions (Signed)

## 2014-10-05 ENCOUNTER — Encounter (HOSPITAL_COMMUNITY): Payer: Self-pay | Admitting: Emergency Medicine

## 2014-10-05 ENCOUNTER — Emergency Department (HOSPITAL_COMMUNITY)
Admission: EM | Admit: 2014-10-05 | Discharge: 2014-10-05 | Disposition: A | Discharge status: Home / Self Care | Payer: Medicare Other | Attending: Emergency Medicine | Admitting: Emergency Medicine

## 2014-10-05 ENCOUNTER — Emergency Department (HOSPITAL_COMMUNITY): Payer: Medicare Other

## 2014-10-05 DIAGNOSIS — R0981 Nasal congestion: Secondary | ICD-10-CM | POA: Diagnosis not present

## 2014-10-05 DIAGNOSIS — Z8619 Personal history of other infectious and parasitic diseases: Secondary | ICD-10-CM | POA: Diagnosis not present

## 2014-10-05 DIAGNOSIS — Z8719 Personal history of other diseases of the digestive system: Secondary | ICD-10-CM | POA: Insufficient documentation

## 2014-10-05 DIAGNOSIS — I10 Essential (primary) hypertension: Secondary | ICD-10-CM | POA: Diagnosis not present

## 2014-10-05 DIAGNOSIS — Z720 Tobacco use: Secondary | ICD-10-CM | POA: Insufficient documentation

## 2014-10-05 DIAGNOSIS — L293 Anogenital pruritus, unspecified: Secondary | ICD-10-CM | POA: Diagnosis present

## 2014-10-05 DIAGNOSIS — E119 Type 2 diabetes mellitus without complications: Secondary | ICD-10-CM | POA: Insufficient documentation

## 2014-10-05 DIAGNOSIS — J45909 Unspecified asthma, uncomplicated: Secondary | ICD-10-CM | POA: Insufficient documentation

## 2014-10-05 DIAGNOSIS — M1990 Unspecified osteoarthritis, unspecified site: Secondary | ICD-10-CM | POA: Insufficient documentation

## 2014-10-05 DIAGNOSIS — N760 Acute vaginitis: Secondary | ICD-10-CM

## 2014-10-05 DIAGNOSIS — R05 Cough: Secondary | ICD-10-CM

## 2014-10-05 DIAGNOSIS — Z794 Long term (current) use of insulin: Secondary | ICD-10-CM | POA: Diagnosis not present

## 2014-10-05 DIAGNOSIS — R079 Chest pain, unspecified: Secondary | ICD-10-CM | POA: Diagnosis not present

## 2014-10-05 DIAGNOSIS — Z7982 Long term (current) use of aspirin: Secondary | ICD-10-CM | POA: Insufficient documentation

## 2014-10-05 DIAGNOSIS — Z85528 Personal history of other malignant neoplasm of kidney: Secondary | ICD-10-CM | POA: Diagnosis not present

## 2014-10-05 DIAGNOSIS — R059 Cough, unspecified: Secondary | ICD-10-CM

## 2014-10-05 DIAGNOSIS — Z79899 Other long term (current) drug therapy: Secondary | ICD-10-CM | POA: Insufficient documentation

## 2014-10-05 DIAGNOSIS — R0989 Other specified symptoms and signs involving the circulatory and respiratory systems: Secondary | ICD-10-CM | POA: Insufficient documentation

## 2014-10-05 LAB — CBC WITH DIFFERENTIAL/PLATELET
Basophils Absolute: 0 10*3/uL (ref 0.0–0.1)
Basophils Relative: 0 % (ref 0–1)
EOS PCT: 1 % (ref 0–5)
Eosinophils Absolute: 0.1 10*3/uL (ref 0.0–0.7)
HCT: 46.7 % — ABNORMAL HIGH (ref 36.0–46.0)
HEMOGLOBIN: 15.5 g/dL — AB (ref 12.0–15.0)
LYMPHS ABS: 4 10*3/uL (ref 0.7–4.0)
LYMPHS PCT: 32 % (ref 12–46)
MCH: 31 pg (ref 26.0–34.0)
MCHC: 33.2 g/dL (ref 30.0–36.0)
MCV: 93.4 fL (ref 78.0–100.0)
Monocytes Absolute: 1.6 10*3/uL — ABNORMAL HIGH (ref 0.1–1.0)
Monocytes Relative: 13 % — ABNORMAL HIGH (ref 3–12)
NEUTROS ABS: 6.9 10*3/uL (ref 1.7–7.7)
NEUTROS PCT: 54 % (ref 43–77)
Platelets: 160 10*3/uL (ref 150–400)
RBC: 5 MIL/uL (ref 3.87–5.11)
RDW: 12.9 % (ref 11.5–15.5)
WBC: 12.6 10*3/uL — ABNORMAL HIGH (ref 4.0–10.5)

## 2014-10-05 LAB — BASIC METABOLIC PANEL
Anion gap: 9 (ref 5–15)
BUN: 17 mg/dL (ref 6–20)
CHLORIDE: 97 mmol/L — AB (ref 101–111)
CO2: 28 mmol/L (ref 22–32)
Calcium: 9 mg/dL (ref 8.9–10.3)
Creatinine, Ser: 0.98 mg/dL (ref 0.44–1.00)
GFR calc Af Amer: >60 mL/min (ref >60)
GFR calc non Af Amer: >60 mL/min (ref >60)
GLUCOSE: 87 mg/dL (ref 65–99)
POTASSIUM: 3.4 mmol/L — AB (ref 3.5–5.1)
Sodium: 134 mmol/L — ABNORMAL LOW (ref 135–145)

## 2014-10-05 LAB — TROPONIN I

## 2014-10-05 MED ORDER — FLUCONAZOLE 100 MG PO TABS
150.0000 mg | ORAL_TABLET | Freq: Once | ORAL | Status: AC
  Administered 2014-10-05: 150 mg via ORAL
  Filled 2014-10-05: qty 2

## 2014-10-05 MED ORDER — HYDROCODONE-ACETAMINOPHEN 5-325 MG PO TABS
1.0000 | ORAL_TABLET | Freq: Once | ORAL | Status: AC
  Administered 2014-10-05: 1 via ORAL
  Filled 2014-10-05: qty 1

## 2014-10-05 MED ORDER — BENZONATATE 100 MG PO CAPS: 100.0000 mg | ORAL_CAPSULE | Freq: Three times a day (TID) | ORAL | Status: DC

## 2014-10-05 NOTE — ED Provider Notes (Signed)
CSN: [id number]     Arrival date & time 10/05/14  1522 History   First MD Initiated Contact with Patient 10/05/14 1613     Chief Complaint  Patient presents with  . Nasal Congestion  . Cough  . Chest Pain  . Vaginal Itching      HPI  Patient presents for evaluation with multiple complaints. Cough and cold symptoms runny nose congestion frequent cough. Admits somewhat later on as an afterthought that "my chest kind of hurts". Admits it is mainly when she coughs. Diffuse anterior. Also states that she was at Dr. Johnnye Sima office this week and was told she had a yeast infection. Has been having discharge that has better but not gone. States that she was given "a cream and that purple dye stuff on my vagina". It sounds like she was given gentian violet.  Past Medical History  Diagnosis Date  . Type 2 diabetes mellitus   . Essential hypertension   . Asthma   . Neuropathic pain   . Hepatitis C   . Renal carcinoma   . Hyperlipidemia   . Arthritis   . Rash   . GERD (gastroesophageal reflux disease)   . PSVT (paroxysmal supraventricular tachycardia)   . Vaginal itching 10/01/2014  . Vaginal irritation 10/01/2014  . Yeast infection of the vagina 10/01/2014   Past Surgical History  Procedure Laterality Date  . Abdominal hysterectomy    . Wart removal    . Colonoscopy N/A 07/08/2013    RMR: Multiple colonic polyps treated [name] as described above. Inadequate prepartation comprimised examination there colonic diverticulosis  . Esophagogastroduodenoscopy N/A 07/08/2013    RMR: Schatzki's ring ; small hiatal hernia-status post passage of a Maloney dilator  . Savory dilation N/A 07/08/2013    Procedure: SAVORY DILATION;  Surgeon: [name], MD;  Location: [facility];  Service: Endoscopy;  Laterality: N/A;  [name] dilation N/A 07/08/2013    Procedure: [name] DILATION;  Surgeon: [name], MD;  Location: [facility];  Service: Endoscopy;  Laterality: N/A;  . Tonsillectomy     . Robot assisted laparoscopic nephrectomy Right 10/14/2013    Procedure: ROBOTIC ASSISTED LAPAROSCOPIC RADICAL NEPHRECTOMY;  Surgeon: [name], MD;  Location: [facility];  Service: Urology;  Laterality: Right;   Family History  Problem Relation Age of Onset  . Colon cancer Sister 47  . Cancer Mother   . Heart attack Father   . Hypertension Daughter   . Cancer Maternal Grandfather   . Diabetes Paternal Grandmother    Social History  Substance Use Topics  . Smoking status: Current Every Day Smoker -- 0.00 packs/day for 40 years    Types: Cigarettes  . Smokeless tobacco: Never Used  . Alcohol Use: 0.0 oz/week    0 Standard drinks or equivalent per week     Comment: beer daily   OB History    Gravida Para Term Preterm AB TAB SAB Ectopic Multiple Living   1 1        1      Review of Systems  Constitutional: Negative for fever, chills, diaphoresis, appetite change and fatigue.  HENT: Negative for mouth sores, sore throat and trouble swallowing.   Eyes: Negative for visual disturbance.  Respiratory: Positive for cough. Negative for chest tightness, shortness of breath and wheezing.   Cardiovascular: Positive for chest pain.  Gastrointestinal: Negative for nausea, vomiting, abdominal pain, diarrhea and abdominal distention.  Endocrine: Negative for polydipsia, polyphagia and polyuria.  Genitourinary: Positive for  vaginal discharge. Negative for dysuria, frequency and hematuria.  Musculoskeletal: Negative for gait problem.  Skin: Negative for color change, pallor and rash.  Neurological: Negative for dizziness, syncope, light-headedness and headaches.  Hematological: Does not bruise/bleed easily.  Psychiatric/Behavioral: Negative for behavioral problems and confusion.      Allergies  Sulfa antibiotics  Home Medications   Prior to Admission medications   Medication Sig Start Date End Date Taking? Authorizing Provider  aspirin EC 325 MG tablet Take 325 mg by mouth daily.     Historical Provider, MD  benzonatate (TESSALON) 100 MG capsule Take 1 capsule (100 mg total) by mouth every 8 (eight) hours. 10/05/14   Tanna Furry, MD  gabapentin (NEURONTIN) 300 MG capsule Take 300 mg by mouth 2 (two) times daily.    Historical Provider, MD  insulin glargine (LANTUS) 100 UNIT/ML injection Inject 40 Units into the skin daily as needed (FOR SUGAR LEVELS).     Historical Provider, MD  lidocaine (LIDODERM) 5 % Place 1 patch onto the skin daily.  06/18/14   Historical Provider, MD  lisinopril-hydrochlorothiazide (PRINZIDE,ZESTORETIC) 20-12.5 MG per tablet Take 1 tablet by mouth every morning.    Historical Provider, MD  metFORMIN (GLUCOPHAGE) 500 MG tablet Take 500 mg by mouth 2 (two) times daily with a meal.    Historical Provider, MD  metoprolol tartrate (LOPRESSOR) 25 MG tablet Take 1 tablet (25 mg total) by mouth 2 (two) times daily. 11/04/13   Imogene Burn, PA-C  PACERONE 100 MG tablet Take 100 mg by mouth daily. 08/24/14   Historical Provider, MD  terconazole (TERAZOL 7) 0.4 % vaginal cream Place 1 applicator vaginally at bedtime. 10/01/14   Estill Dooms, NP   BP 170/94 mmHg  Pulse 77  Temp(Src) 98.6 F (37 C) (Oral)  Resp 16  Ht 5' (1.524 m)  Wt 154 lb (69.854 kg)  BMI 30.08 kg/m2  SpO2 95% Physical Exam  Constitutional: She is oriented to person, place, and time. She appears well-developed and well-nourished. No distress.  HENT:  Head: Normocephalic.  Eyes: Conjunctivae are normal. Pupils are equal, round, and reactive to light. No scleral icterus.  Neck: Normal range of motion. Neck supple. No thyromegaly present.  Cardiovascular: Normal rate and regular rhythm.  Exam reveals no gallop and no friction rub.   No murmur heard. Pulmonary/Chest: Effort normal and breath sounds normal. No respiratory distress. She has no wheezes. She has no rales.  Abdominal: Soft. Bowel sounds are normal. She exhibits no distension. There is no tenderness. There is no rebound.   Genitourinary:  Declines pelvic exam  Musculoskeletal: Normal range of motion.  Neurological: She is alert and oriented to person, place, and time.  Skin: Skin is warm and dry. No rash noted.  Psychiatric: She has a normal mood and affect. Her behavior is normal.    ED Course  Procedures (including critical care time) Labs Review Labs Reviewed  CBC WITH DIFFERENTIAL/PLATELET - Abnormal; Notable for the following:    WBC 12.6 (*)    Hemoglobin 15.5 (*)    HCT 46.7 (*)    Monocytes Relative 13 (*)    Monocytes Absolute 1.6 (*)    All other components within normal limits  BASIC METABOLIC PANEL - Abnormal; Notable for the following:    Sodium 134 (*)    Potassium 3.4 (*)    Chloride 97 (*)    All other components within normal limits  TROPONIN I    Imaging Review Dg Chest 2 View  10/05/2014   CLINICAL DATA:  Productive cough, nasal congestion, and generalized chest pain since yesterday, type II diabetes mellitus, hypertension, asthma, smoker, renal cell carcinoma  EXAM: CHEST  2 VIEW  COMPARISON:  06/30/2014  FINDINGS: Normal heart size, mediastinal contours, and pulmonary vascularity.  Atherosclerotic calcification aorta.  Lungs clear.  No pleural effusion or pneumothorax.  Bones unremarkable.  IMPRESSION: No acute abnormalities.   Electronically Signed   By: Lavonia Dana M.D.   On: 10/05/2014 17:51   I have personally reviewed and evaluated these images and lab results as part of my medical decision-making.   EKG Interpretation   Date/Time:  Tuesday October 05 2014 15:57:19 EDT Ventricular Rate:  81 PR Interval:  186 QRS Duration: 72 QT Interval:  424 QTC Calculation: 492 R Axis:   1 Text Interpretation:  Normal sinus rhythm Prolonged QT Abnormal ECG  Confirmed by Jeneen Rinks  MD, [place] ([id number]) on 10/05/2014 4:26:32 PM      MDM   Final diagnoses:  Vaginitis  Cough    Clear lungs. Not febrile. Not hypoxemic. Normal EKG and normal troponin. Plan is Tessalon for cough.  She declines additional pelvic exam stating "my doctor just did it". I think one dose of Diflucan is appropriate. Her QTC is 0.475. She'll not be under ongoing medications that would prolong her QT. Stop smoking. Primary care follow-up.    Tanna Furry, MD 10/05/14 [phone]

## 2014-10-05 NOTE — Discharge Instructions (Signed)
Cough, Adult  A cough is a reflex that helps clear your throat and airways. It can help heal the body or may be a reaction to an irritated airway. A cough may only last 2 or 3 weeks (acute) or may last more than 8 weeks (chronic).  CAUSES Acute cough:  Viral or bacterial infections. Chronic cough:  Infections.  Allergies.  Asthma.  Post-nasal drip.  Smoking.  Heartburn or acid reflux.  Some medicines.  Chronic lung problems (COPD).  Cancer. SYMPTOMS   Cough.  Fever.  Chest pain.  Increased breathing rate.  High-pitched whistling sound when breathing (wheezing).  Colored mucus that you cough up (sputum). TREATMENT   A bacterial cough may be treated with antibiotic medicine.  A viral cough must run its course and will not respond to antibiotics.  Your caregiver may recommend other treatments if you have a chronic cough. HOME CARE INSTRUCTIONS   Only take over-the-counter or prescription medicines for pain, discomfort, or fever as directed by your caregiver. Use cough suppressants only as directed by your caregiver.  Use a cold steam vaporizer or humidifier in your bedroom or home to help loosen secretions.  Sleep in a semi-upright position if your cough is worse at night.  Rest as needed.  Stop smoking if you smoke. SEEK IMMEDIATE MEDICAL CARE IF:   You have pus in your sputum.  Your cough starts to worsen.  You cannot control your cough with suppressants and are losing sleep.  You begin coughing up blood.  You have difficulty breathing.  You develop pain which is getting worse or is uncontrolled with medicine.  You have a fever. MAKE SURE YOU:   Understand these instructions.  Will watch your condition.  Will get help right away if you are not doing well or get worse. Document Released: 07/07/2010 Document Revised: 04/02/2011 Document Reviewed: 07/07/2010 [facility] Patient Information 2015 [place], [place]. This information is not intended  to replace advice given to you by your health care provider. Make sure you discuss any questions you have with your health care provider.  Vaginitis Vaginitis is an inflammation of the vagina. It is most often caused by a change in the normal balance of the bacteria and yeast that live in the vagina. This change in balance causes an overgrowth of certain bacteria or yeast, which causes the inflammation. There are different types of vaginitis, but the most common types are:  Bacterial vaginosis.  Yeast infection (candidiasis).  Trichomoniasis vaginitis. This is a sexually transmitted infection (STI).  Viral vaginitis.  Atropic vaginitis.  Allergic vaginitis. CAUSES  The cause depends on the type of vaginitis. Vaginitis can be caused by:  Bacteria (bacterial vaginosis).  Yeast (yeast infection).  A parasite (trichomoniasis vaginitis)  A virus (viral vaginitis).  Low hormone levels (atrophic vaginitis). Low hormone levels can occur during pregnancy, breastfeeding, or after menopause.  Irritants, such as bubble baths, scented tampons, and feminine sprays (allergic vaginitis). Other factors can change the normal balance of the yeast and bacteria that live in the vagina. These include:  Antibiotic medicines.  Poor hygiene.  Diaphragms, vaginal sponges, spermicides, birth control pills, and intrauterine devices (IUD).  Sexual intercourse.  Infection.  Uncontrolled diabetes.  A weakened immune system. SYMPTOMS  Symptoms can vary depending on the cause of the vaginitis. Common symptoms include:  Abnormal vaginal discharge.  The discharge is white, gray, or yellow with bacterial vaginosis.  The discharge is thick, white, and cheesy with a yeast infection.  The discharge is frothy  and yellow or greenish with trichomoniasis.  A bad vaginal odor.  The odor is fishy with bacterial vaginosis.  Vaginal itching, pain, or swelling.  Painful intercourse.  Pain or burning  when urinating. Sometimes, there are no symptoms. TREATMENT  Treatment will vary depending on the type of infection.   Bacterial vaginosis and trichomoniasis are often treated with antibiotic creams or pills.  Yeast infections are often treated with antifungal medicines, such as vaginal creams or suppositories.  Viral vaginitis has no cure, but symptoms can be treated with medicines that relieve discomfort. Your sexual partner should be treated as well.  Atrophic vaginitis may be treated with an estrogen cream, pill, suppository, or vaginal ring. If vaginal dryness occurs, lubricants and moisturizing creams may help. You may be told to avoid scented soaps, sprays, or douches.  Allergic vaginitis treatment involves quitting the use of the product that is causing the problem. Vaginal creams can be used to treat the symptoms. HOME CARE INSTRUCTIONS   Take all medicines as directed by your caregiver.  Keep your genital area clean and dry. Avoid soap and only rinse the area with water.  Avoid douching. It can remove the healthy bacteria in the vagina.  Do not use tampons or have sexual intercourse until your vaginitis has been treated. Use sanitary pads while you have vaginitis.  Wipe from front to back. This avoids the spread of bacteria from the rectum to the vagina.  Let air reach your genital area.  Wear cotton underwear to decrease moisture buildup.  Avoid wearing underwear while you sleep until your vaginitis is gone.  Avoid tight pants and underwear or nylons without a cotton panel.  Take off wet clothing (especially bathing suits) as soon as possible.  Use mild, non-scented products. Avoid using irritants, such as:  Scented feminine sprays.  Fabric softeners.  Scented detergents.  Scented tampons.  Scented soaps or bubble baths.  Practice safe sex and use condoms. Condoms may prevent the spread of trichomoniasis and viral vaginitis. SEEK MEDICAL CARE IF:   You  have abdominal pain.  You have a fever or persistent symptoms for more than 2-3 days.  You have a fever and your symptoms suddenly get worse. Document Released: 11/05/2006 Document Revised: 10/03/2011 Document Reviewed: 06/21/2011 [facility] Patient Information 2015 [place], [place]. This information is not intended to replace advice given to you by your health care provider. Make sure you discuss any questions you have with your health care provider.

## 2014-10-05 NOTE — ED Notes (Signed)
Having cold symptoms since yesterday.  Cough and nasal drainage.  Also during triage, pt says she has vaginal yeast.  C/o vaginal itching and burning.

## 2014-10-05 NOTE — ED Notes (Signed)
Pt did not tell registration that she was having chest pain on arrival.  Pt only revealed chest pain and vaginal itching at end of triage.  EKG done and pt taken to room 19.

## 2014-10-07 ENCOUNTER — Ambulatory Visit: Payer: Medicare Other | Admitting: Adult Health

## 2014-10-13 ENCOUNTER — Ambulatory Visit (INDEPENDENT_AMBULATORY_CARE_PROVIDER_SITE_OTHER): Payer: Medicare Other | Admitting: Adult Health

## 2014-10-13 ENCOUNTER — Encounter: Payer: Self-pay | Admitting: Adult Health

## 2014-10-13 VITALS — BP 160/90 | HR 76 | Ht 60.0 in | Wt 156.0 lb

## 2014-10-13 DIAGNOSIS — N898 Other specified noninflammatory disorders of vagina: Secondary | ICD-10-CM

## 2014-10-13 DIAGNOSIS — L298 Other pruritus: Secondary | ICD-10-CM

## 2014-10-13 DIAGNOSIS — L900 Lichen sclerosus et atrophicus: Secondary | ICD-10-CM | POA: Diagnosis not present

## 2014-10-13 HISTORY — DX: Lichen sclerosus et atrophicus: L90.0

## 2014-10-13 MED ORDER — CLOBETASOL PROP EMOLLIENT BASE 0.05 % EX CREA: TOPICAL_CREAM | CUTANEOUS | Status: DC

## 2014-10-13 NOTE — Progress Notes (Signed)
Subjective:     Patient ID: Cassandra Jordan, female   DOB: 07-21-1950, 64 y.o.   MRN: [id number]  HPI Cassandra Jordan is a 64 year old black female back in follow up of being treated for yeast with gentian violet and terazol cream and she feels better but still has some itching, esp where had episiotomy.Has cough treated in ER for allergies recently.  Review of Systems +itching, +cough, all other systems negative Reviewed past medical,surgical, social and family history. Reviewed medications and allergies.     Objective:   Physical Exam BP 160/90 mmHg  Pulse 76  Ht 5' (1.524 m)  Wt 156 lb (70.761 kg)  BMI 30.47 kg/m2   Skin warm and dry, labia still with some redness, less excoriation, has white area at introitus still.  Assessment:     Lichen sclerosus Vaginal itch    Plan:     Rx temovate 0.05% cream use 2 x daily for 2 weeks then 2-3 x weekly with 1 refill Follow up in 3 weeks for recheck

## 2014-10-13 NOTE — Patient Instructions (Signed)
Use temovate cream 2 x daily for 2 weeks then 2-3 x weekly  Follow up with me in 3 weeks Lichen Sclerosus Lichen sclerosus is a skin problem. It can happen on any part of the body, but it commonly involves the anal or genital areas. Lichen sclerosus is not an infection or a fungus. Girls and women are more commonly affected than boys and men. CAUSES The cause is not known. It could be the result of an overactive immune system or a lack of certain hormones. Lichen sclerosus is not passed from one person to another (not contagious). SYMPTOMS Your skin may have:  Thin, wrinkled, white areas.  Thickened white areas.  Red and swollen patches.  Tears or cracks.  Bruising.  Blood blisters.  Severe itching. You may also have pain, itching, or burning with urination. Constipation is also common in people with lichen sclerosus. DIAGNOSIS Your caregiver will do a physical exam. Sometimes, a tissue sample (biopsy) may be sent for testing. TREATMENT Treatment may involve putting a thin layer of medicated cream (topical steroid) over the areas with lichen sclerosus. Use the cream only as directed by your caregiver.  HOME CARE INSTRUCTIONS  Only take over-the-counter or prescription medicines as directed by your caregiver.  Keep the vaginal area as clean and dry as possible. SEEK MEDICAL CARE IF: You develop increasing pain, swelling, or redness. Document Released: 05/31/2010 Document Revised: 04/02/2011 Document Reviewed: 05/31/2010 [facility] Patient Information 2015 [place], [place]. This information is not intended to replace advice given to you by your health care provider. Make sure you discuss any questions you have with your health care provider.

## 2014-11-03 ENCOUNTER — Encounter: Payer: Self-pay | Admitting: Adult Health

## 2014-11-03 ENCOUNTER — Ambulatory Visit (INDEPENDENT_AMBULATORY_CARE_PROVIDER_SITE_OTHER): Payer: Medicare Other | Admitting: Adult Health

## 2014-11-03 VITALS — BP 174/90 | HR 60 | Ht 60.0 in | Wt 157.0 lb

## 2014-11-03 DIAGNOSIS — B3731 Acute candidiasis of vulva and vagina: Secondary | ICD-10-CM

## 2014-11-03 DIAGNOSIS — B373 Candidiasis of vulva and vagina: Secondary | ICD-10-CM

## 2014-11-03 DIAGNOSIS — N898 Other specified noninflammatory disorders of vagina: Secondary | ICD-10-CM

## 2014-11-03 DIAGNOSIS — L298 Other pruritus: Secondary | ICD-10-CM | POA: Diagnosis not present

## 2014-11-03 DIAGNOSIS — L900 Lichen sclerosus et atrophicus: Secondary | ICD-10-CM | POA: Diagnosis not present

## 2014-11-03 LAB — POCT WET PREP (WET MOUNT): WBC, Wet Prep HPF POC: POSITIVE

## 2014-11-03 MED ORDER — TERCONAZOLE 0.4 % VA CREA: 1.0000 | TOPICAL_CREAM | Freq: Every day | VAGINAL | Status: DC

## 2014-11-03 NOTE — Patient Instructions (Signed)
Use terazol cream   Continue temovate 2-3 x weekly follow up prn

## 2014-11-03 NOTE — Progress Notes (Signed)
Subjective:     Patient ID: Cassandra Jordan, female   DOB: 13-Apr-1950, 64 y.o.   MRN: [id number]  HPI Morrissa is a 64 year old black female in complaining of vaginal itch again.started last night, had felt much better.  Review of Systems  +vaginal itch, all other systems negative Reviewed past medical,surgical, social and family history. Reviewed medications and allergies.     Objective:   Physical Exam BP 174/90 mmHg  Pulse 60  Ht 5' (1.524 m)  Wt 157 lb (71.215 kg)  BMI 30.66 kg/m2   Skin warm and dry.Pelvic: external genitalia has color changes and some LSA,but looks better at introitus, vagina: white discharge without odor,urethra has no lesions or masses noted, cervix and uterus are absent, adnexa: no masses or tenderness noted. Bladder is non tender and no masses felt. Wet prep: + for yeast and +WBCs.  Assessment:     Vaginal itch Vaginal discharge Yeast infection LSA    Plan:     Rx terazol 7 cream use in vagina at hs x 7 with 2 refills Continue temovate 2-3 x weekly Follow up prn

## 2014-11-06 ENCOUNTER — Emergency Department (HOSPITAL_COMMUNITY)
Admission: EM | Admit: 2014-11-06 | Discharge: 2014-11-06 | Disposition: A | Discharge status: Home / Self Care | Payer: Medicare Other | Attending: Emergency Medicine | Admitting: Emergency Medicine

## 2014-11-06 ENCOUNTER — Encounter (HOSPITAL_COMMUNITY): Payer: Self-pay | Admitting: Emergency Medicine

## 2014-11-06 DIAGNOSIS — Z8619 Personal history of other infectious and parasitic diseases: Secondary | ICD-10-CM | POA: Insufficient documentation

## 2014-11-06 DIAGNOSIS — G629 Polyneuropathy, unspecified: Secondary | ICD-10-CM | POA: Diagnosis not present

## 2014-11-06 DIAGNOSIS — J45909 Unspecified asthma, uncomplicated: Secondary | ICD-10-CM | POA: Diagnosis not present

## 2014-11-06 DIAGNOSIS — Z720 Tobacco use: Secondary | ICD-10-CM | POA: Diagnosis not present

## 2014-11-06 DIAGNOSIS — Z8719 Personal history of other diseases of the digestive system: Secondary | ICD-10-CM | POA: Insufficient documentation

## 2014-11-06 DIAGNOSIS — I10 Essential (primary) hypertension: Secondary | ICD-10-CM | POA: Insufficient documentation

## 2014-11-06 DIAGNOSIS — Z8742 Personal history of other diseases of the female genital tract: Secondary | ICD-10-CM | POA: Diagnosis not present

## 2014-11-06 DIAGNOSIS — Z8739 Personal history of other diseases of the musculoskeletal system and connective tissue: Secondary | ICD-10-CM | POA: Insufficient documentation

## 2014-11-06 DIAGNOSIS — Z85528 Personal history of other malignant neoplasm of kidney: Secondary | ICD-10-CM | POA: Insufficient documentation

## 2014-11-06 DIAGNOSIS — G8929 Other chronic pain: Secondary | ICD-10-CM | POA: Diagnosis not present

## 2014-11-06 DIAGNOSIS — E119 Type 2 diabetes mellitus without complications: Secondary | ICD-10-CM | POA: Diagnosis not present

## 2014-11-06 DIAGNOSIS — Z760 Encounter for issue of repeat prescription: Secondary | ICD-10-CM | POA: Diagnosis not present

## 2014-11-06 DIAGNOSIS — Z79899 Other long term (current) drug therapy: Secondary | ICD-10-CM | POA: Insufficient documentation

## 2014-11-06 MED ORDER — GABAPENTIN 300 MG PO CAPS: 300.0000 mg | ORAL_CAPSULE | Freq: Two times a day (BID) | ORAL | Status: DC

## 2014-11-06 NOTE — ED Notes (Signed)
PT states she is being treated for peripheral neuropathy and ran out of pain medication prescription (lyrica) this past Wednesday and now c/o bilateral lower leg pain x3 days.

## 2014-11-06 NOTE — Discharge Instructions (Signed)
Emergency Department Resource Guide 1) Find a Doctor and Pay Out of Pocket Although you won't have to find out who is covered by your insurance plan, it is a good idea to ask around and get recommendations. You will then need to call the office and see if the doctor you have chosen will accept you as a new patient and what types of options they offer for patients who are self-pay. Some doctors offer discounts or will set up payment plans for their patients who do not have insurance, but you will need to ask so you aren't surprised when you get to your appointment.  2) Contact Your Local Health Department Not all health departments have doctors that can see patients for sick visits, but many do, so it is worth a call to see if yours does. If you don't know where your local health department is, you can check in your phone book. The CDC also has a tool to help you locate your state's health department, and many state websites also have listings of all of their local health departments.  3) Find a [facility] If your illness is not likely to be very severe or complicated, you may want to try a walk in clinic. These are popping up all over the country in pharmacies, drugstores, and shopping centers. They're usually staffed by nurse practitioners or physician assistants that have been trained to treat common illnesses and complaints. They're usually fairly quick and inexpensive. However, if you have serious medical issues or chronic medical problems, these are probably not your best option.  No Primary Care Doctor: - Call Health Connect at  [phone] - they can help you locate a primary care doctor that  accepts your insurance, provides certain services, etc. - Physician Referral Service- [phone]  Chronic Pain Problems: Organization         Address  Phone   Notes  [facility]  [phone] Patients need to be referred by their primary care doctor.   Medication  Assistance: Organization         Address  Phone   Notes  [facility] Medication [facility] [address] [place], [place] [address] [phone] --Must be a resident of [facility] -- Must have NO insurance coverage whatsoever (no Medicaid/ Medicare, etc.) -- The pt. MUST have a primary care doctor that directs their care regularly and follows them in the community   [facility]ist  [phone]   [employer]  [phone]    Agencies that provide inexpensive medical care: Organization         Address  Phone   Notes  [place]  [phone]   [facility]    [phone]   [facility] [place], [place] [address] [phone]   [place] [address], [place] [phone]   Planned Parenthood    [phone]   [name]    [phone]   [place] and [place] [address], [place] Phone:  [phone], Fax:  [phone] Hours of Operation:  9 am - 6 pm, M-F.  Also accepts Medicaid/Medicare and self-pay.  [facility] for [place], [address],  Phone: [phone], Fax: [phone]. Hours of Operation:  8:30 am - 5:30 pm, M-F.  Also accepts Medicaid and self-pay.  [facility] [address], [place] Phone: [phone]   [place], [place], [place] [phone], Ext. [phone] Mondays & Thursdays: 7-9 AM.  First 15 patients are seen on a first come, first serve basis.    [place] Providers:  Organization         Address  Phone   Notes  [facility] [address], [place] [phone] Also accepts self-pay patients.  [facility] [address], [place]  [phone]   [place], [address], [place]  [phone]   [facility] [address] [place] [phone]   Lucianne Lei [address], [place]   [phone] Only accepts [place] Access [place] patients after they have their name applied to their card.   Self-Pay (no insurance) in [facility]:  Organization         Address  Phone   Notes  Sickle Cell Patients, [facility] [place] [phone]   [facility] Urgent Care [place] [phone]   [facility] [place]  [place], [place], [place] [phone]   [facility]/Dr. Osei-Bonsu  [address] [place] or [address], [place] [phone] Phone number for both [place] and [place] locations is the same.  Urgent Medical and [facility] [address], [place] [phone]   [facility] [address] [place] or [address] [phone]   [facility] [address] [place] [phone], phone; [phone], fax Sees patients 1st and 3rd Saturday of every month.  Must not qualify for public or private insurance (i.e. Medicaid, Medicare, [place] Health Choice, Veterans' Benefits)  Household income should be no more than 200% of the poverty level The clinic cannot treat you if you are pregnant or think you are pregnant  Sexually transmitted diseases are not treated at the clinic.    Dental Care: Organization         Address  Phone  Notes  [facility] Department of [facility] [place] [phone] Accepts children up to age 27 who are enrolled in [place] or [place]; pregnant women with a Medicaid card; and children who have applied for Medicaid or [place] Health Choice, but were declined, whose parents can pay a reduced fee at time of service.  [facility]  Department of [facility]  [address], [place] [phone] Accepts children up to age 38 who are enrolled in [place] or [place]; pregnant women with a Medicaid card; and children who have applied for Medicaid or [place] Health Choice, but were declined, whose parents can pay a reduced fee at time of service.  [facility]  [place] [phone] Patients are seen by appointment only. Walk-ins are not accepted. [place] will see patients 24 years of age and older. Monday - Tuesday (8am-5pm) Most Wednesdays (8:30-5pm) $30 per visit, cash only  [facility] Adult Dental Access PROGRAM  [address], [facility] [phone] Patients are seen by appointment only. Walk-ins are not accepted. [place] will see patients 48 years of age and older. One  Wednesday Evening (Monthly: Volunteer Based).  $30 per visit, cash only  [place]  [phone] for adults; Children under age 11, call Graduate Pediatric Dentistry at [phone]. Children aged 42-14, please call [phone] to request a pediatric application.  Dental services are provided in all areas of dental care including fillings, crowns and bridges, complete and partial dentures, implants, gum treatment, root canals, and extractions. Preventive care is also provided. Treatment is provided to both adults and children. Patients are selected via a lottery and there is often a waiting list.   [facility] [address], [place]  [phone] [email or web address]   [facility] [address] [place], [place] [phone], Ext. [phone] Second and Fourth Thursday of each month, opens at 6:30 AM; Clinic ends at 9 AM.  Patients are seen on a first-come first-served basis, and a limited number are seen during each clinic.   [facility]  [address] Hillard Danker [place], [place] [phone]    Eligibility Requirements You must have lived in [place], [place], or [place] counties for at least the last three months.   You cannot be eligible for state or federal sponsored [employer], including [employer], [place], or [employer].   You generally cannot be eligible for healthcare insurance through your employer.    How to apply: Eligibility screenings are held every Tuesday and Wednesday afternoon from 1:00 pm until 4:00 pm. You do not need an appointment for the interview!  [facility] [address] [place], [place]   [place]  [place] Department  [place]  [phone]    Behavioral Health Resources in the Community: Intensive Outpatient Programs Organization         Address  Phone  Notes  [place]. [address] [place], [place] [phone]   [facility] Outpatient [address] [place], [place]   ADS: [facility] [address], [place], [place]   [place] [address]  [place], [place] or [phone]   Substance Abuse Resources Organization         Address  Phone  Notes  Alcohol and Drug Services  [phone]   [place]  [phone]   [facility] [place]   Chinita Pester  [phone]   Residential & Outpatient Substance Abuse Program  [phone]   Psychological Services Organization         Address  Phone  Notes  [facility] [place]  [place]  [phone]   [place] [address], [place] or [phone]    Mobile Crisis Teams Organization         Address  Phone  Notes  [facility], Mobile Crisis Care Unit  [phone]   [facility]  [address]  [place], [place]   [place] [address] [place] [phone]    Self-Help/Support Groups Organization         Address  Phone             Notes  [place]. of [place] - variety of support groups  [place] Call for more information  Narcotics Anonymous (NA), [facility] [address], [employer] [place]  2 meetings at this location  Residential Treatment Programs Organization         Address  Phone  Notes  [facility] [address]    [place]  [phone]   [facility]  [address] [place] 329924, [place], [place]   [place], [place] [phone] Admissions: 8am-3pm M-F  Incentives Substance [place] [address],    [place], [place] [phone]   [facility] [address] [place], [place], [place]   [facility] [address],  [place], [place]   [facility] Programs - Intensive Outpatient [address], [place], [place]   [facility] ([place].) [address].,  [place], [place] [phone] or [phone]   [facility] (RTS) [address], [place], [place] Accepts Medicaid  [facility] [place] [address],  [place] [phone] Substance Abuse/Addiction Treatment   [facility] Organization         Address  Phone  Notes  [facility]  [phone]   Domenic Schwab, PhD [address] Arlis Porta [place], [place]   [phone] or [phone]   [place], [place] [phone]   [facility] [address] [place], [place] [phone] Insurance/Medicaid/sponsorship through [facility] and Families [address], [address] [place]                                    [place], [place] [phone] [place] [address], [place] [phone]    Dr. Adele Schilder  [phone]   [facility] Dept. 1) [address], [place] 2) [place] 3)  [place] [address], [place] [phone]  [phone]   [place] [phone] or [phone] (After Hours)      Take the prescription as directed. Speak to your family doctor about wanting to re-start your lyrica. Call your regular medical doctor Monday to schedule a follow up appointment within the next 3 days.  Return to the Emergency Department immediately sooner if worsening.

## 2014-11-06 NOTE — ED Provider Notes (Signed)
CSN: [id number]     Arrival date & time 11/06/14  1527 History   First MD Initiated Contact with Patient 11/06/14 1544     Chief Complaint  Patient presents with  . Peripheral Neuropathy      HPI Pt was seen at 1600. Per pt, c/o gradual onset and persistence of constant "ran out of my medication" that began 3 days ago. Pt states she ran out of her lyrica and is requesting a refill. Pt states she takes lyrica for chronic pain due to peripheral neuropathy. Denies any change in her usual chronic pain. Denies any other complaints.    Past Medical History  Diagnosis Date  . Type 2 diabetes mellitus ([place])   . Essential hypertension   . Asthma   . Neuropathic pain   . Hepatitis C   . Renal carcinoma ([place])   . Hyperlipidemia   . Arthritis   . Rash   . GERD (gastroesophageal reflux disease)   . PSVT (paroxysmal supraventricular tachycardia) ([place])   . Vaginal itching 10/01/2014  . Vaginal irritation 10/01/2014  . Yeast infection of the vagina 10/01/2014  . Lichen sclerosus et atrophicus 10/13/2014   Past Surgical History  Procedure Laterality Date  . Abdominal hysterectomy    . Wart removal    . Colonoscopy N/A 07/08/2013    RMR: Multiple colonic polyps treated [name] as described above. Inadequate prepartation comprimised examination there colonic diverticulosis  . Esophagogastroduodenoscopy N/A 07/08/2013    RMR: Schatzki's ring ; small hiatal hernia-status post passage of a Maloney dilator  . Savory dilation N/A 07/08/2013    Procedure: SAVORY DILATION;  Surgeon: [name], MD;  [facility];  Service: Endoscopy;  Laterality: N/A;  [name] dilation N/A 07/08/2013    Procedure: [name] DILATION;  Surgeon: [name], MD;  Location: [facility];  Service: Endoscopy;  Laterality: N/A;  . Tonsillectomy    . Robot assisted laparoscopic nephrectomy Right 10/14/2013    Procedure: ROBOTIC ASSISTED LAPAROSCOPIC RADICAL NEPHRECTOMY;  Surgeon: [name], MD;  Location:  [facility] ORS;  Service: Urology;  Laterality: Right;   Family History  Problem Relation Age of Onset  . Colon cancer Sister 35  . Cancer Mother   . Heart attack Father   . Hypertension Daughter   . Cancer Maternal Grandfather   . Diabetes Paternal Grandmother    Social History  Substance Use Topics  . Smoking status: Current Every Day Smoker -- 0.10 packs/day for 40 years    Types: Cigarettes  . Smokeless tobacco: Never Used  . Alcohol Use: 0.0 oz/week    0 Standard drinks or equivalent per week     Comment: once a week   OB History    Gravida Para Term Preterm AB TAB SAB Ectopic Multiple Living   1 1        1      Review of Systems ROS: Statement: All systems negative except as marked or noted in the HPI; Constitutional: Negative for fever and chills. ; ; Eyes: Negative for eye pain, redness and discharge. ; ; ENMT: Negative for ear pain, hoarseness, nasal congestion, sinus pressure and sore throat. ; ; Cardiovascular: Negative for chest pain, palpitations, diaphoresis, dyspnea and peripheral edema. ; ; Respiratory: Negative for cough, wheezing and stridor. ; ; Gastrointestinal: Negative for nausea, vomiting, diarrhea, abdominal pain, blood in stool, hematemesis, jaundice and rectal bleeding. . ; ; Genitourinary: Negative for dysuria, flank pain and hematuria. ; ; Musculoskeletal: Negative for back pain  and neck pain. Negative for swelling and trauma.; ; Skin: Negative for pruritus, rash, abrasions, blisters, bruising and skin lesion.; ; Neuro: +peripheral neuropathy. Negative for headache, lightheadedness and neck stiffness. Negative for weakness, altered level of consciousness , altered mental status, extremity weakness, involuntary movement, seizure and syncope.      Allergies  Sulfa antibiotics  Home Medications   Prior to Admission medications   Medication Sig Start Date End Date Taking? Authorizing Provider  Clobetasol Prop Emollient Base 0.05 % emollient cream as needed.   10/20/14   Historical Provider, MD  gabapentin (NEURONTIN) 300 MG capsule Take 300 mg by mouth 2 (two) times daily.    Historical Provider, MD  gabapentin (NEURONTIN) 300 MG capsule Take 1 capsule (300 mg total) by mouth 2 (two) times daily. 11/06/14   Francine Graven, DO  lidocaine (LIDODERM) 5 % Place 1 patch onto the skin as needed.  06/18/14   Historical Provider, MD  lisinopril-hydrochlorothiazide (PRINZIDE,ZESTORETIC) 20-12.5 MG per tablet Take 1 tablet by mouth every morning.    Historical Provider, MD  metFORMIN (GLUCOPHAGE) 500 MG tablet Take 500 mg by mouth 2 (two) times daily with a meal.    Historical Provider, MD  metoprolol tartrate (LOPRESSOR) 25 MG tablet Take 1 tablet (25 mg total) by mouth 2 (two) times daily. 11/04/13   Imogene Burn, PA-C  PACERONE 100 MG tablet Take 100 mg by mouth daily. 08/24/14   Historical Provider, MD  terconazole (TERAZOL 7) 0.4 % vaginal cream Place 1 applicator vaginally at bedtime. 11/03/14   Estill Dooms, NP   BP 146/88 mmHg  Pulse 68  Temp(Src) 98.1 F (36.7 C) (Oral)  Resp 18  Ht 5' (1.524 m)  Wt 156 lb (70.761 kg)  BMI 30.47 kg/m2  SpO2 99% Physical Exam  1605: Physical examination:  Nursing notes reviewed; Vital signs and O2 SAT reviewed;  Constitutional: Well developed, Well nourished, Well hydrated, In no acute distress; Head:  Normocephalic, atraumatic; Eyes: EOMI, PERRL, No scleral icterus; ENMT: Mouth and pharynx normal, Mucous membranes moist; Neck: Supple, Full range of motion, No lymphadenopathy; Cardiovascular: Regular rate and rhythm, No gallop; Respiratory: Breath sounds clear & equal bilaterally, No wheezes.  Speaking full sentences with ease, Normal respiratory effort/excursion; Chest: Nontender, Movement normal; Abdomen: Soft, Nontender, Nondistended, Normal bowel sounds; Genitourinary: No CVA tenderness; Extremities: Pulses normal, No tenderness, No edema, No calf edema or asymmetry.; Neuro: AA&Ox3, Major CN grossly intact.   Speech clear. No gross focal motor or sensory deficits in extremities.; Skin: Color normal, Warm, Dry.   ED Course  Procedures (including critical care time) Labs Review   Imaging Review  I have personally reviewed and evaluated these images and lab results as part of my medical decision-making.   EKG Interpretation None      MDM  MDM Reviewed: previous chart, nursing note and vitals     1615:  Pt requesting lyrica refill, but did not know her dose/frequency. EPIC chart reviewed: pt has not been on lyrica for several months. Pt queried regarding this. Pt now states her PMD discontinued the lyrica several months ago and started her on neurontin. Pt states she has run out of neurontin, but wants a refill of the lyrica "because I thought it worked better." I told pt she would need to discuss this medication switch with her PMD and that I would refill her neurontin today. Pt verb understanding.    Francine Graven, DO 11/10/14 2054

## 2014-11-11 ENCOUNTER — Ambulatory Visit (HOSPITAL_COMMUNITY)
Admission: RE | Admit: 2014-11-11 | Discharge: 2014-11-11 | Disposition: A | Discharge status: Home / Self Care | Payer: Medicare Other | Source: Ambulatory Visit | Attending: Urology | Admitting: Urology

## 2014-11-11 ENCOUNTER — Other Ambulatory Visit: Payer: Self-pay | Admitting: Urology

## 2014-11-11 DIAGNOSIS — C649 Malignant neoplasm of unspecified kidney, except renal pelvis: Secondary | ICD-10-CM | POA: Diagnosis not present

## 2014-11-11 DIAGNOSIS — K8020 Calculus of gallbladder without cholecystitis without obstruction: Secondary | ICD-10-CM | POA: Diagnosis not present

## 2014-11-11 DIAGNOSIS — E119 Type 2 diabetes mellitus without complications: Secondary | ICD-10-CM | POA: Diagnosis not present

## 2014-11-11 DIAGNOSIS — Z85528 Personal history of other malignant neoplasm of kidney: Secondary | ICD-10-CM | POA: Diagnosis not present

## 2014-11-11 DIAGNOSIS — C641 Malignant neoplasm of right kidney, except renal pelvis: Secondary | ICD-10-CM | POA: Diagnosis not present

## 2014-11-11 DIAGNOSIS — I10 Essential (primary) hypertension: Secondary | ICD-10-CM | POA: Insufficient documentation

## 2014-11-27 ENCOUNTER — Encounter (HOSPITAL_COMMUNITY): Payer: Self-pay | Admitting: *Deleted

## 2014-11-27 ENCOUNTER — Emergency Department (HOSPITAL_COMMUNITY)
Admission: EM | Admit: 2014-11-27 | Discharge: 2014-11-27 | Disposition: A | Discharge status: Home / Self Care | Payer: Medicare Other | Attending: Emergency Medicine | Admitting: Emergency Medicine

## 2014-11-27 DIAGNOSIS — G629 Polyneuropathy, unspecified: Secondary | ICD-10-CM | POA: Diagnosis not present

## 2014-11-27 DIAGNOSIS — E119 Type 2 diabetes mellitus without complications: Secondary | ICD-10-CM | POA: Diagnosis not present

## 2014-11-27 DIAGNOSIS — Z8719 Personal history of other diseases of the digestive system: Secondary | ICD-10-CM | POA: Diagnosis not present

## 2014-11-27 DIAGNOSIS — W275XXA Contact with paper-cutter, initial encounter: Secondary | ICD-10-CM | POA: Diagnosis not present

## 2014-11-27 DIAGNOSIS — S61213A Laceration without foreign body of left middle finger without damage to nail, initial encounter: Secondary | ICD-10-CM | POA: Diagnosis not present

## 2014-11-27 DIAGNOSIS — Z8619 Personal history of other infectious and parasitic diseases: Secondary | ICD-10-CM | POA: Diagnosis not present

## 2014-11-27 DIAGNOSIS — Z79899 Other long term (current) drug therapy: Secondary | ICD-10-CM | POA: Diagnosis not present

## 2014-11-27 DIAGNOSIS — Z8739 Personal history of other diseases of the musculoskeletal system and connective tissue: Secondary | ICD-10-CM | POA: Insufficient documentation

## 2014-11-27 DIAGNOSIS — Z872 Personal history of diseases of the skin and subcutaneous tissue: Secondary | ICD-10-CM | POA: Insufficient documentation

## 2014-11-27 DIAGNOSIS — Z23 Encounter for immunization: Secondary | ICD-10-CM | POA: Diagnosis not present

## 2014-11-27 DIAGNOSIS — Y9389 Activity, other specified: Secondary | ICD-10-CM | POA: Diagnosis not present

## 2014-11-27 DIAGNOSIS — Y9289 Other specified places as the place of occurrence of the external cause: Secondary | ICD-10-CM | POA: Insufficient documentation

## 2014-11-27 DIAGNOSIS — S6992XA Unspecified injury of left wrist, hand and finger(s), initial encounter: Secondary | ICD-10-CM | POA: Diagnosis present

## 2014-11-27 DIAGNOSIS — Z85528 Personal history of other malignant neoplasm of kidney: Secondary | ICD-10-CM | POA: Insufficient documentation

## 2014-11-27 DIAGNOSIS — Z720 Tobacco use: Secondary | ICD-10-CM | POA: Insufficient documentation

## 2014-11-27 DIAGNOSIS — I10 Essential (primary) hypertension: Secondary | ICD-10-CM | POA: Diagnosis not present

## 2014-11-27 DIAGNOSIS — Z8742 Personal history of other diseases of the female genital tract: Secondary | ICD-10-CM | POA: Diagnosis not present

## 2014-11-27 DIAGNOSIS — Y998 Other external cause status: Secondary | ICD-10-CM | POA: Diagnosis not present

## 2014-11-27 DIAGNOSIS — S61219A Laceration without foreign body of unspecified finger without damage to nail, initial encounter: Secondary | ICD-10-CM

## 2014-11-27 DIAGNOSIS — J45909 Unspecified asthma, uncomplicated: Secondary | ICD-10-CM | POA: Insufficient documentation

## 2014-11-27 MED ORDER — TETANUS-DIPHTH-ACELL PERTUSSIS 5-2.5-18.5 LF-MCG/0.5 IM SUSP
0.5000 mL | Freq: Once | INTRAMUSCULAR | Status: AC
  Administered 2014-11-27: 0.5 mL via INTRAMUSCULAR
  Filled 2014-11-27: qty 0.5

## 2014-11-27 NOTE — Discharge Instructions (Signed)
Keep wound clean and dry. If you were given medicines take as directed.  If you are on coumadin or contraceptives realize their levels and effectiveness is altered by many different medicines.  If you have any reaction (rash, tongues swelling, other) to the medicines stop taking and see a physician.    If your blood pressure was elevated in the ER make sure you follow up for management with a primary doctor or return for chest pain, shortness of breath or stroke symptoms. Return for signs of infection. Please follow up as directed and return to the ER or see a physician for new or worsening symptoms.  Thank you. Filed Vitals:   11/27/14 1230  BP: 135/90  Pulse: 77  Temp: 97.8 F (36.6 C)  TempSrc: Oral  Resp: 16  Height: 5' (1.524 m)  Weight: 156 lb (70.761 kg)  SpO2: 99%

## 2014-11-27 NOTE — ED Notes (Signed)
Small laceration to left middle finger which she cut with a paper cutter last night at ~2200. States site wont stop bleeding Unsure of last tetanus.

## 2014-11-27 NOTE — ED Provider Notes (Signed)
CSN: [id number]     Arrival date & time 11/27/14  1225 History  By signing my name below, I, Cassandra Jordan, attest that this documentation has been prepared under the direction and in the presence of [name], MD. Electronically Signed: [name], ED Scribe. 11/27/2014. 1:11 PM.   Chief Complaint  Patient presents with  . Extremity Laceration      HPI Comments: 64 year old female with history of hepatitis, diabetes tobacco abuse presents with left middle finger laceration earlier today. Patient cut on a box cutter. Superficial bleeding controlled. No other injuries. Unsure tetanus status.  The history is provided by the patient.    Past Medical History  Diagnosis Date  . Type 2 diabetes mellitus ([place])   . Essential hypertension   . Asthma   . Neuropathic pain   . Hepatitis C   . Renal carcinoma ([place])   . Hyperlipidemia   . Arthritis   . Rash   . GERD (gastroesophageal reflux disease)   . PSVT (paroxysmal supraventricular tachycardia) ([place])   . Vaginal itching 10/01/2014  . Vaginal irritation 10/01/2014  . Yeast infection of the vagina 10/01/2014  . Lichen sclerosus et atrophicus 10/13/2014   Past Surgical History  Procedure Laterality Date  . Abdominal hysterectomy    . Wart removal    . Colonoscopy N/A 07/08/2013    RMR: Multiple colonic polyps treated [name] as described above. Inadequate prepartation comprimised examination there colonic diverticulosis  . Esophagogastroduodenoscopy N/A 07/08/2013    RMR: Schatzki's ring ; small hiatal hernia-status post passage of a Maloney dilator  . Savory dilation N/A 07/08/2013    Procedure: SAVORY DILATION;  Surgeon: [name], MD;  [facility];  Service: Endoscopy;  Laterality: N/A;  [name] dilation N/A 07/08/2013    Procedure: [name] DILATION;  Surgeon: [name], MD;  [facility];  Service: Endoscopy;  Laterality: N/A;  . Tonsillectomy    . Robot assisted laparoscopic nephrectomy Right  10/14/2013    Procedure: ROBOTIC ASSISTED LAPAROSCOPIC RADICAL NEPHRECTOMY;  Surgeon: [name], MD;  Location: [facility];  Service: Urology;  Laterality: Right;   Family History  Problem Relation Age of Onset  . Colon cancer Sister 39  . Cancer Mother   . Heart attack Father   . Hypertension Daughter   . Cancer Maternal Grandfather   . Diabetes Paternal Grandmother    Social History  Substance Use Topics  . Smoking status: Current Every Day Smoker -- 0.10 packs/day for 40 years    Types: Cigarettes  . Smokeless tobacco: Never Used  . Alcohol Use: 0.0 oz/week    0 Standard drinks or equivalent per week     Comment: once a week   OB History    Gravida Para Term Preterm AB TAB SAB Ectopic Multiple Living   1 1        1      Review of Systems  Skin: Positive for wound.  Neurological: Negative for weakness and numbness.      Allergies  Sulfa antibiotics  Home Medications   Prior to Admission medications   Medication Sig Start Date End Date Taking? Authorizing Provider  Clobetasol Prop Emollient Base 0.05 % emollient cream as needed.  10/20/14   Historical Provider, MD  gabapentin (NEURONTIN) 300 MG capsule Take 300 mg by mouth 2 (two) times daily.    Historical Provider, MD  gabapentin (NEURONTIN) 300 MG capsule Take 1 capsule (300 mg total) by mouth 2 (two) times daily.  11/06/14   Francine Graven, DO  lidocaine (LIDODERM) 5 % Place 1 patch onto the skin as needed.  06/18/14   Historical Provider, MD  lisinopril-hydrochlorothiazide (PRINZIDE,ZESTORETIC) 20-12.5 MG per tablet Take 1 tablet by mouth every morning.    Historical Provider, MD  metFORMIN (GLUCOPHAGE) 500 MG tablet Take 500 mg by mouth 2 (two) times daily with a meal.    Historical Provider, MD  metoprolol tartrate (LOPRESSOR) 25 MG tablet Take 1 tablet (25 mg total) by mouth 2 (two) times daily. 11/04/13   Imogene Burn, PA-C  PACERONE 100 MG tablet Take 100 mg by mouth daily. 08/24/14   Historical Provider, MD   terconazole (TERAZOL 7) 0.4 % vaginal cream Place 1 applicator vaginally at bedtime. 11/03/14   Estill Dooms, NP   BP 135/90 mmHg  Pulse 77  Temp(Src) 97.8 F (36.6 C) (Oral)  Resp 16  Ht 5' (1.524 m)  Wt 156 lb (70.761 kg)  BMI 30.47 kg/m2  SpO2 99% Physical Exam  Cardiovascular: Normal rate.   Musculoskeletal: She exhibits tenderness. She exhibits no edema.  Neurological: She is alert.  Skin: Skin is warm.  Patient has 1 cm laceration distal to DIP left middle finger. Superficial, normal tendon function flexion extension distal sensation intact distal.  Nursing note and vitals reviewed.   ED Course  Procedures (including critical care time) LACERATION REPAIR Performed by: Mariea Clonts Authorized by: Mariea Clonts Consent: Verbal consent obtained. Risks and benefits: risks, benefits and alternatives were discussed Consent given by: patient Wound explored   Left middle finger 1 cm  Technique: dermabond Patient tolerance: Patient tolerated the procedure well with no immediate complications.   DIAGNOSTIC STUDIES: Oxygen Saturation is 99% on RA, nl by my interpretation.    COORDINATION OF CARE: 1:11 PM Discussed treatment plan with pt at bedside which includes  pt agreed to plan.    Labs Review Labs Reviewed - No data to display  Imaging Review No results found. I have personally reviewed and evaluated these images and lab results as part of my medical decision-making.   EKG Interpretation None      MDM   Final diagnoses:  Finger laceration, initial encounter   Small skin laceration. Dermabond repaired. Outpatient follow up discussed. Tetanus shot.  If you were given medicines take as directed.  If you are on coumadin or contraceptives realize their levels and effectiveness is altered by many different medicines.  If you have any reaction (rash, tongues swelling, other) to the medicines stop taking and see a physician.    If your blood  pressure was elevated in the ER make sure you follow up for management with a primary doctor or return for chest pain, shortness of breath or stroke symptoms.  Please follow up as directed and return to the ER or see a physician for new or worsening symptoms.  Thank you. Filed Vitals:   11/27/14 1230  BP: 135/90  Pulse: 77  Temp: 97.8 F (36.6 C)  TempSrc: Oral  Resp: 16  Height: 5' (1.524 m)  Weight: 156 lb (70.761 kg)  SpO2: 99%     [name], MD 11/27/14 1312

## 2014-12-10 ENCOUNTER — Ambulatory Visit (INDEPENDENT_AMBULATORY_CARE_PROVIDER_SITE_OTHER): Payer: Medicare Other | Admitting: Gastroenterology

## 2014-12-10 ENCOUNTER — Encounter: Payer: Self-pay | Admitting: Gastroenterology

## 2014-12-10 VITALS — BP 202/111 | HR 74 | Temp 98.0°F | Ht 60.0 in | Wt 160.6 lb

## 2014-12-10 DIAGNOSIS — B182 Chronic viral hepatitis C: Secondary | ICD-10-CM | POA: Diagnosis not present

## 2014-12-10 DIAGNOSIS — Z86010 Personal history of colonic polyps: Secondary | ICD-10-CM | POA: Diagnosis not present

## 2014-12-10 DIAGNOSIS — R7989 Other specified abnormal findings of blood chemistry: Secondary | ICD-10-CM | POA: Diagnosis not present

## 2014-12-10 DIAGNOSIS — R1319 Other dysphagia: Secondary | ICD-10-CM

## 2014-12-10 DIAGNOSIS — R945 Abnormal results of liver function studies: Secondary | ICD-10-CM

## 2014-12-10 NOTE — Assessment & Plan Note (Signed)
History of Schatzki ring status post dilation last year. Some improvement of dysphagia after that procedure. She's had recurrent solid food dysphagia. Barium pill esophagram initially.

## 2014-12-10 NOTE — Assessment & Plan Note (Addendum)
64 year old female with hepatitis C, genotype 1B, treatment nave. MRI one year ago suggestive of cirrhosis however no evidence of portal hypertension on EGD last year. Patient has significantly cut back on alcohol consumption, drinks twice per month. Denies intranasal or injectable illicit drugs. Discussed at length, need to show compliance. She no showed for an appointment recently. She needs to further limit her alcohol consumption and drug use. Discussed reasoning behind proving compliance, risk of reinfection after eradication if she has risky behaviors. She voiced understanding. Revisit pursuing hepatitis C treatment after colonoscopy complete. We can update labs and move forward after first of the year if she continues to prove compliance.

## 2014-12-10 NOTE — Patient Instructions (Addendum)
   Please have your esophagus xray as scheduled. If abnormal you may need to have your esophagus stretched again.  Return to the office in late December for follow-up and to schedule your colonoscopy.  Continue to abstain from alcohol and drug use.  We will discuss moving forward with hepatitis C treatment after your colonoscopy is complete as long as you continue to abstain from alcohol and drug use. This will likely be after the first of the year.    Hepatitis C Hepatitis C is a viral infection of the liver. It can lead to scarring of the liver (cirrhosis), liver failure, or liver cancer. Hepatitis C may go undetected for months or years because people with the infection may not have symptoms, or they may have only mild symptoms. CAUSES  Hepatitis C is caused by the hepatitis C virus (HCV). The virus can be passed from one person to another through:  Blood.  Contaminated needles, such as those used for tattooing, body piercing, acupuncture, or injecting drugs.  Having unprotected sex with an infected person.  Childbirth.  Blood transfusions or organ transplants done in the [place] before 1992. RISK FACTORS Risk factors for hepatitis C include:  Having unprotected sex with an infected person.  Using illegal drugs. SIGNS AND SYMPTOMS  Symptoms of hepatitis C may include:  Fatigue.  Loss of appetite.  Nausea.  Vomiting.  Abdominal pain.  Dark yellow urine.  Yellowish skin and eyes (jaundice).  Itching of the skin.  Clay-colored bowel movements.  Joint pain. Symptoms are not always present.  DIAGNOSIS  Hepatitis C is diagnosed with blood tests. Other types of tests may also be done to check how your liver is functioning. TREATMENT  Your health care provider may perform noninvasive tests or a liver biopsy to help determine the best course of treatment. Treatment for hepatitis C may include one or more medicines. Your health care provider may check you for a  recurring infection or other liver conditions every 6-12 months after treatment. HOME CARE INSTRUCTIONS   Rest as needed.  Take all medicines as directed by your health care provider.  Do not take any medicine unless approved by your health care provider. This includes over-the-counter medicine and birth control pills.  Do not drink alcohol.  Do not have sex until approved by your health care provider.  Do not share toothbrushes, nail clippers, razors, or needles. PREVENTION There is no vaccine for hepatitis C. The only way to prevent the disease is to reduce the risk of exposure to the virus. This may be done by:  Practicing safe sex and using condoms.  Avoiding illegal drugs. SEEK MEDICAL CARE IF:  You have a fever.  You develop abdominal pain.  You develop dark urine.  You have clay-colored bowel movements.  You develop joint pains. SEEK IMMEDIATE MEDICAL CARE IF:  You have increasing fatigue or weakness.  You lose your appetite.  You feel nauseous or vomit.  You develop jaundice or your jaundice gets worse.  You bruise or bleed easily. MAKE SURE YOU:   Understand these instructions.  Will watch your condition.  Will get help right away if you are not doing well or get worse.   This information is not intended to replace advice given to you by your health care provider. Make sure you discuss any questions you have with your health care provider.   Document Released: 01/06/2000 Document Revised: 01/29/2014 Document Reviewed: 04/22/2013 Elsevier Interactive Patient Education [employer].

## 2014-12-10 NOTE — Assessment & Plan Note (Signed)
In the setting of chronic hepatitis C and alcohol abuse. Last checked September 2016. Reevaluate after colonoscopy.

## 2014-12-10 NOTE — Progress Notes (Signed)
Primary Care Physician:  Wenda Low, MD  Primary Gastroenterologist:  Garfield Cornea, MD   Chief Complaint  Patient presents with  . Follow-up    HPI:  Cassandra Jordan is a 64 y.o. female here for follow-up. She no showed her last appointment. Last seen in June 2015 at time of EGD and colonoscopy. She had esophageal dysphagia. Schatzki ring was dilated. No evidence of esophageal varices. Small hiatal hernia. Colonoscopy for first ever high-risk colonoscopy. Multiple colonic polyps treated, inadequate preparation compromised exam, colonic diverticulosis. She had tubular and tubulovillous adenomas removed. She was supposed to have a repeat colonoscopy in December 2015 due to advanced adenomas and inadequate prep previously. Since we last saw her she had right nephrectomy for multiple renal cell tumors.   Patient has also has a history of hepatitis C, genotype 1B, significant alcohol use, chronic GERD, chronic constipation.  MRI abdomen in July 2015 suggestive of early morphological changes of cirrhosis.   BM 2-3 per week. No diarrhea. No melena, brbpr. Recurrent dysphagia, solid foods with every meal. Some improvement after last dilation but symptoms returned. No vomiting. Some nausea. Weight up 10 pounds. Not taking metformin because of gi upset. Wants to wait until after Christmas for colonoscopy.  Patient states that she consumes 24-48 ounces of beer twice per month now. Last marijuana use mid October. Denies injectable or intranasal drug use. Previously smoked crack, remotely.  Current Outpatient Prescriptions  Medication Sig Dispense Refill  . gabapentin (NEURONTIN) 300 MG capsule Take 1 capsule (300 mg total) by mouth 2 (two) times daily. 20 capsule 0  . insulin NPH Human (HUMULIN N,NOVOLIN N) 100 UNIT/ML injection Inject 3 Units into the skin 3 (three) times daily before meals.     Marland Kitchen lisinopril-hydrochlorothiazide (PRINZIDE,ZESTORETIC) 20-12.5 MG per tablet Take 1 tablet by mouth  every morning.    . metoprolol tartrate (LOPRESSOR) 25 MG tablet Take 1 tablet (25 mg total) by mouth 2 (two) times daily. 60 tablet 3  . PACERONE 100 MG tablet      No current facility-administered medications for this visit.    Allergies as of 12/10/2014 - Review Complete 11/27/2014  Allergen Reaction Noted  . Sulfa antibiotics Itching and Other (See Comments) 04/10/2012    Past Medical History  Diagnosis Date  . Type 2 diabetes mellitus ([place])   . Essential hypertension   . Asthma   . Neuropathic pain   . Hepatitis C   . Renal carcinoma ([place])   . Hyperlipidemia   . Arthritis   . Rash   . GERD (gastroesophageal reflux disease)   . PSVT (paroxysmal supraventricular tachycardia) ([place])   . Vaginal itching 10/01/2014  . Vaginal irritation 10/01/2014  . Yeast infection of the vagina 10/01/2014  . Lichen sclerosus et atrophicus 10/13/2014    Past Surgical History  Procedure Laterality Date  . Abdominal hysterectomy    . Wart removal    . Colonoscopy N/A 07/08/2013    RMR: Multiple colonic polyps treated [name] as described above. Inadequate prepartation comprimised examination there colonic diverticulosis. tubular and tubulovillous adenomas. next TCS 12/2013  . Esophagogastroduodenoscopy N/A 07/08/2013    RMR: Schatzki's ring ; small hiatal hernia-status post passage of a Maloney dilator  . Savory dilation N/A 07/08/2013    Procedure: SAVORY DILATION;  Surgeon: [name], MD;  [facility];  Service: Endoscopy;  Laterality: N/A;  [name] dilation N/A 07/08/2013    Procedure: [name] DILATION;  Surgeon: [name], MD;  [facility];  Service: Endoscopy;  Laterality: N/A;  . Tonsillectomy    . Robot assisted laparoscopic nephrectomy Right 10/14/2013    Procedure: ROBOTIC ASSISTED LAPAROSCOPIC RADICAL NEPHRECTOMY;  Surgeon: [name], MD;  Location: [facility];  Service: Urology;  Laterality: Right;    Family History  Problem Relation Age of Onset  .  Colon cancer Sister 37  . Cancer Mother   . Heart attack Father   . Hypertension Daughter   . Cancer Maternal Grandfather   . Diabetes Paternal Grandmother     Social History   Social History  . Marital Status: Single    Spouse Name: N/A  . Number of Children: N/A  . Years of Education: N/A   Occupational History  . Not on file.   Social History Main Topics  . Smoking status: Current Every Day Smoker -- 0.10 packs/day for 40 years    Types: Cigarettes  . Smokeless tobacco: Never Used  . Alcohol Use: 0.0 oz/week    0 Standard drinks or equivalent per week     Comment: beer once or twice per month, 24-40 ounce.   . Drug Use: No     Comment: marijuana 10/2014. remote smoked crack.   . Sexual Activity: Not Currently    Birth Control/ Protection: Surgical     Comment: hyst   Other Topics Concern  . Not on file   Social History Narrative      ROS:  General: Negative for anorexia, weight loss, fever, chills, fatigue, weakness. Eyes: Negative for vision changes.  ENT: Negative for hoarseness, difficulty swallowing , nasal congestion. CV: Negative for chest pain, angina, palpitations, dyspnea on exertion, peripheral edema.  Respiratory: Negative for dyspnea at rest, dyspnea on exertion, cough, sputum, wheezing.  GI: See history of present illness. GU:  Negative for dysuria, hematuria, urinary incontinence, urinary frequency, nocturnal urination.  MS: Negative for joint pain, low back pain.  Derm: Negative for rash or itching.  Neuro: Negative for weakness, abnormal sensation, seizure, frequent headaches, memory loss, confusion.  Psych: Negative for anxiety, depression, suicidal ideation, hallucinations.  Endo: Negative for unusual weight change.  Heme: Negative for bruising or bleeding. Allergy: Negative for rash or hives.    Physical Examination:  BP 202/111 mmHg  Pulse 74  Temp(Src) 98 F (36.7 C) (Oral)  Ht 5' (1.524 m)  Wt 160 lb 9.6 oz (72.848 kg)  BMI  31.37 kg/m2   General: Well-nourished, well-developed in no acute distress.  Head: Normocephalic, atraumatic.   Eyes: Conjunctiva pink, no icterus. Mouth: Oropharyngeal mucosa moist and pink , no lesions erythema or exudate. Neck: Supple without thyromegaly, masses, or lymphadenopathy.  Lungs: Clear to auscultation bilaterally.  Heart: Regular rate and rhythm, no murmurs rubs or gallops.  Abdomen: Bowel sounds are normal, nontender, nondistended, no hepatosplenomegaly or masses, no abdominal bruits or    hernia , no rebound or guarding.   Rectal: Not performed Extremities: No lower extremity edema. No clubbing or deformities.  Neuro: Alert and oriented x 4 , grossly normal neurologically.  Skin: Warm and dry, no rash or jaundice.   Psych: Alert and cooperative, normal mood and affect.  Labs: Lab Results  Component Value Date   CREATININE 0.98 10/05/2014   BUN 17 10/05/2014   NA 134* 10/05/2014   K 3.4* 10/05/2014   CL 97* 10/05/2014   CO2 28 10/05/2014   Lab Results  Component Value Date   WBC 12.6* 10/05/2014   HGB 15.5* 10/05/2014   HCT 46.7* 10/05/2014  MCV 93.4 10/05/2014   PLT 160 10/05/2014   Lab Results  Component Value Date   ALT 113* 06/30/2014   AST 116* 06/30/2014   ALKPHOS 79 06/30/2014   BILITOT 0.8 06/30/2014   Lab Results  Component Value Date   TSH 1.440 06/10/2014   Lab Results  Component Value Date   IRON 66 06/30/2013   FERRITIN 164 06/30/2013     Imaging Studies: Dg Chest 2 View  11/11/2014  CLINICAL DATA:  History of renal cell carcinoma, hypertension, and diabetes EXAM: CHEST  2 VIEW COMPARISON:  Chest x-ray of 10/05/2014 FINDINGS: No active infiltrate or effusion is seen. There is no evidence of metastatic involvement of the lungs. Mediastinal and hilar contours are unremarkable. The heart is within normal limits in size. No bony abnormality is seen. IMPRESSION: No active cardiopulmonary disease. Electronically Signed   By: Ivar Drape  M.D.   On: 11/11/2014 14:56

## 2014-12-10 NOTE — Assessment & Plan Note (Signed)
64 year old female with inadequate bowel prep last year, multiple advanced adenomas removed. Overdue for early interval follow-up colonoscopy. She would like to postpone until after Christmas. We will have her come back in a few weeks to get that scheduled. She will need to have 2 full days of clear liquids. Consider utilizing Linzess prior to her procedure as well as additional PEG.

## 2014-12-13 NOTE — Progress Notes (Signed)
CC'D TO PCP °

## 2014-12-20 ENCOUNTER — Ambulatory Visit (HOSPITAL_COMMUNITY)
Admission: RE | Admit: 2014-12-20 | Discharge: 2014-12-20 | Disposition: A | Discharge status: Home / Self Care | Payer: Medicare Other | Source: Ambulatory Visit | Attending: Gastroenterology | Admitting: Gastroenterology

## 2014-12-20 DIAGNOSIS — R1319 Other dysphagia: Secondary | ICD-10-CM | POA: Insufficient documentation

## 2014-12-20 DIAGNOSIS — R1310 Dysphagia, unspecified: Secondary | ICD-10-CM | POA: Diagnosis not present

## 2014-12-24 ENCOUNTER — Telehealth: Payer: Self-pay | Admitting: Physician Assistant

## 2014-12-24 MED ORDER — METOPROLOL TARTRATE 25 MG PO TABS: 25.0000 mg | ORAL_TABLET | Freq: Two times a day (BID) | ORAL | Status: DC

## 2014-12-24 NOTE — Telephone Encounter (Signed)
°*  STAT* If patient is at the pharmacy, call can be transferred to refill team.   1. Which medications need to be refilled? (please list name of each medication and dose if known) Metoprolol  2. Which pharmacy/location (including street and city if local pharmacy) is medication to be sent to? [facility]  3. Do they need a 30 day or 90 day supply?[place]

## 2014-12-29 NOTE — Progress Notes (Signed)
Quick Note:  Please let patient know her BPE was normal. Keep ov later this month to schedule her colonoscopy. ______

## 2015-01-03 ENCOUNTER — Emergency Department (HOSPITAL_COMMUNITY)
Admission: EM | Admit: 2015-01-03 | Discharge: 2015-01-03 | Disposition: A | Discharge status: Home / Self Care | Payer: Medicare Other | Attending: Emergency Medicine | Admitting: Emergency Medicine

## 2015-01-03 ENCOUNTER — Emergency Department (HOSPITAL_COMMUNITY): Payer: Medicare Other

## 2015-01-03 ENCOUNTER — Encounter (HOSPITAL_COMMUNITY): Payer: Self-pay | Admitting: Emergency Medicine

## 2015-01-03 DIAGNOSIS — Z794 Long term (current) use of insulin: Secondary | ICD-10-CM | POA: Insufficient documentation

## 2015-01-03 DIAGNOSIS — Z79899 Other long term (current) drug therapy: Secondary | ICD-10-CM | POA: Diagnosis not present

## 2015-01-03 DIAGNOSIS — R011 Cardiac murmur, unspecified: Secondary | ICD-10-CM | POA: Insufficient documentation

## 2015-01-03 DIAGNOSIS — Z872 Personal history of diseases of the skin and subcutaneous tissue: Secondary | ICD-10-CM | POA: Diagnosis not present

## 2015-01-03 DIAGNOSIS — R002 Palpitations: Secondary | ICD-10-CM

## 2015-01-03 DIAGNOSIS — E876 Hypokalemia: Secondary | ICD-10-CM | POA: Insufficient documentation

## 2015-01-03 DIAGNOSIS — F17210 Nicotine dependence, cigarettes, uncomplicated: Secondary | ICD-10-CM | POA: Insufficient documentation

## 2015-01-03 DIAGNOSIS — M1990 Unspecified osteoarthritis, unspecified site: Secondary | ICD-10-CM | POA: Diagnosis not present

## 2015-01-03 DIAGNOSIS — Z8619 Personal history of other infectious and parasitic diseases: Secondary | ICD-10-CM | POA: Diagnosis not present

## 2015-01-03 DIAGNOSIS — I10 Essential (primary) hypertension: Secondary | ICD-10-CM | POA: Insufficient documentation

## 2015-01-03 DIAGNOSIS — Z85528 Personal history of other malignant neoplasm of kidney: Secondary | ICD-10-CM | POA: Insufficient documentation

## 2015-01-03 DIAGNOSIS — Z8742 Personal history of other diseases of the female genital tract: Secondary | ICD-10-CM | POA: Insufficient documentation

## 2015-01-03 DIAGNOSIS — N289 Disorder of kidney and ureter, unspecified: Secondary | ICD-10-CM | POA: Insufficient documentation

## 2015-01-03 DIAGNOSIS — J45901 Unspecified asthma with (acute) exacerbation: Secondary | ICD-10-CM | POA: Diagnosis not present

## 2015-01-03 DIAGNOSIS — E119 Type 2 diabetes mellitus without complications: Secondary | ICD-10-CM | POA: Diagnosis not present

## 2015-01-03 DIAGNOSIS — R0602 Shortness of breath: Secondary | ICD-10-CM | POA: Diagnosis not present

## 2015-01-03 LAB — COMPREHENSIVE METABOLIC PANEL
ALT: 68 U/L — ABNORMAL HIGH (ref 14–54)
ANION GAP: 8 (ref 5–15)
AST: 74 U/L — ABNORMAL HIGH (ref 15–41)
Albumin: 3.6 g/dL (ref 3.5–5.0)
Alkaline Phosphatase: 63 U/L (ref 38–126)
BUN: 21 mg/dL — ABNORMAL HIGH (ref 6–20)
CALCIUM: 9.3 mg/dL (ref 8.9–10.3)
CHLORIDE: 94 mmol/L — AB (ref 101–111)
CO2: 31 mmol/L (ref 22–32)
Creatinine, Ser: 1.47 mg/dL — ABNORMAL HIGH (ref 0.44–1.00)
GFR calc non Af Amer: 37 mL/min — ABNORMAL LOW (ref >60)
GFR, EST AFRICAN AMERICAN: 43 mL/min — AB (ref >60)
Glucose, Bld: 302 mg/dL — ABNORMAL HIGH (ref 65–99)
POTASSIUM: 3.2 mmol/L — AB (ref 3.5–5.1)
SODIUM: 133 mmol/L — AB (ref 135–145)
Total Bilirubin: 0.8 mg/dL (ref 0.3–1.2)
Total Protein: 8.3 g/dL — ABNORMAL HIGH (ref 6.5–8.1)

## 2015-01-03 LAB — CBC WITH DIFFERENTIAL/PLATELET
Basophils Absolute: 0 10*3/uL (ref 0.0–0.1)
Basophils Relative: 0 %
EOS ABS: 0.2 10*3/uL (ref 0.0–0.7)
EOS PCT: 2 %
HCT: 45.5 % (ref 36.0–46.0)
Hemoglobin: 15.2 g/dL — ABNORMAL HIGH (ref 12.0–15.0)
Lymphocytes Relative: 53 %
Lymphs Abs: 4.7 10*3/uL — ABNORMAL HIGH (ref 0.7–4.0)
MCH: 30.7 pg (ref 26.0–34.0)
MCHC: 33.4 g/dL (ref 30.0–36.0)
MCV: 91.9 fL (ref 78.0–100.0)
MONOS PCT: 10 %
Monocytes Absolute: 0.9 10*3/uL (ref 0.1–1.0)
NEUTROS ABS: 3.1 10*3/uL (ref 1.7–7.7)
NEUTROS PCT: 35 %
PLATELETS: 188 10*3/uL (ref 150–400)
RBC: 4.95 MIL/uL (ref 3.87–5.11)
RDW: 12.5 % (ref 11.5–15.5)
WBC: 8.9 10*3/uL (ref 4.0–10.5)

## 2015-01-03 LAB — CBG MONITORING, ED: GLUCOSE-CAPILLARY: 301 mg/dL — AB (ref 65–99)

## 2015-01-03 LAB — TROPONIN I: Troponin I: <0.03 ng/mL (ref <0.031)

## 2015-01-03 MED ORDER — POTASSIUM CHLORIDE 10 MEQ/100ML IV SOLN
10.0000 meq | Freq: Once | INTRAVENOUS | Status: AC
  Administered 2015-01-03: 10 meq via INTRAVENOUS
  Filled 2015-01-03: qty 100

## 2015-01-03 MED ORDER — MAGNESIUM SULFATE 2 GM/50ML IV SOLN
2.0000 g | INTRAVENOUS | Status: AC
  Administered 2015-01-03: 2 g via INTRAVENOUS
  Filled 2015-01-03: qty 50

## 2015-01-03 MED ORDER — SODIUM CHLORIDE 0.9 % IV BOLUS (SEPSIS)
500.0000 mL | Freq: Once | INTRAVENOUS | Status: AC
Start: 2015-01-03 — End: 2015-01-03
  Administered 2015-01-03: 500 mL via INTRAVENOUS

## 2015-01-03 MED ORDER — ASPIRIN 81 MG PO CHEW
324.0000 mg | CHEWABLE_TABLET | Freq: Once | ORAL | Status: AC
  Administered 2015-01-03: 324 mg via ORAL
  Filled 2015-01-03: qty 4

## 2015-01-03 NOTE — ED Provider Notes (Signed)
CSN: [id number]     Arrival date & time 01/03/15  2585 History   First MD Initiated Contact with Patient 01/03/15 0719     Chief Complaint  Patient presents with  . Palpitations     (Consider location/radiation/quality/duration/timing/severity/associated sxs/prior Treatment) HPI Comments: Patient is a 64 year old female, she has a history of paroxysmal supraventricular tachycardia as well as diabetes, hypertension and hepatitis C. She states that she has had palpitations in the past, according to the cardiology notes most recently from May of this year she had been treated with amiodarone as well as metoprolol twice a day with good control over her palpitations. Her last echocardiogram from 2015 showed a normal ejection fraction with grade 1 diastolic dysfunction but no regional wall motion abnormalities. She states that over the last week she has had some increased palpitations which she reports is an extra heartbeat every 3 or 4 minutes followed by a positive and then back to normal. She denies chest pain but does have occasional shortness of breath. At this time the patient has no symptoms. She has had no fevers or chills or cough or swelling of the legs. She has been taking her medications. States that she is compliant, she does take lisinopril hydrochlorothiazide, metoprolol for blood pressure.  The palpitations are intermittent, nothing makes them better or worse, not associated with chest pain  Patient is a 64 y.o. female presenting with palpitations. The history is provided by the patient and medical records.  Palpitations   Past Medical History  Diagnosis Date  . Type 2 diabetes mellitus ([place])   . Essential hypertension   . Asthma   . Neuropathic pain   . Hepatitis C   . Renal carcinoma ([place])   . Hyperlipidemia   . Arthritis   . Rash   . GERD (gastroesophageal reflux disease)   . PSVT (paroxysmal supraventricular tachycardia) ([place])   . Vaginal itching 10/01/2014  . Vaginal  irritation 10/01/2014  . Yeast infection of the vagina 10/01/2014  . Lichen sclerosus et atrophicus 10/13/2014   Past Surgical History  Procedure Laterality Date  . Abdominal hysterectomy    . Wart removal    . Colonoscopy N/A 07/08/2013    RMR: Multiple colonic polyps treated [name] as described above. Inadequate prepartation comprimised examination there colonic diverticulosis. tubular and tubulovillous adenomas. next TCS 12/2013  . Esophagogastroduodenoscopy N/A 07/08/2013    RMR: Schatzki's ring ; small hiatal hernia-status post passage of a Maloney dilator  . Savory dilation N/A 07/08/2013    Procedure: SAVORY DILATION;  Surgeon: [name], MD;  [facility];  Service: Endoscopy;  Laterality: N/A;  [name] dilation N/A 07/08/2013    Procedure: [name] DILATION;  Surgeon: [name], MD;  [facility];  Service: Endoscopy;  Laterality: N/A;  . Tonsillectomy    . Robot assisted laparoscopic nephrectomy Right 10/14/2013    Procedure: ROBOTIC ASSISTED LAPAROSCOPIC RADICAL NEPHRECTOMY;  Surgeon: [name], MD;  Location: [facility];  Service: Urology;  Laterality: Right;   Family History  Problem Relation Age of Onset  . Colon cancer Sister 5  . Cancer Mother   . Heart attack Father   . Hypertension Daughter   . Cancer Maternal Grandfather   . Diabetes Paternal Grandmother    Social History  Substance Use Topics  . Smoking status: Current Every Day Smoker -- 0.10 packs/day for 40 years    Types: Cigarettes  . Smokeless tobacco: Never Used  . Alcohol Use: 0.0 oz/week  0 Standard drinks or equivalent per week     Comment: beer once or twice per month, 24-40 ounce.    OB History    Gravida Para Term Preterm AB TAB SAB Ectopic Multiple Living   1 1        1      Review of Systems  Cardiovascular: Positive for palpitations.  All other systems reviewed and are negative.     Allergies  Sulfa antibiotics  Home Medications   Prior to Admission  medications   Medication Sig Start Date End Date Taking? Authorizing Provider  gabapentin (NEURONTIN) 300 MG capsule Take 1 capsule (300 mg total) by mouth 2 (two) times daily. 11/06/14  Yes Francine Graven, DO  insulin NPH Human (HUMULIN N,NOVOLIN N) 100 UNIT/ML injection Inject 3 Units into the skin 3 (three) times daily before meals.    Yes Historical Provider, MD  lisinopril-hydrochlorothiazide (PRINZIDE,ZESTORETIC) 20-12.5 MG per tablet Take 1 tablet by mouth every morning.   Yes Historical Provider, MD  metoprolol tartrate (LOPRESSOR) 25 MG tablet Take 1 tablet (25 mg total) by mouth 2 (two) times daily. 12/24/14  Yes Satira Sark, MD  PACERONE 100 MG tablet Take 100 mg by mouth daily.  11/05/14  Yes Historical Provider, MD   BP 159/79 mmHg  Pulse 50  Temp(Src) 98.1 F (36.7 C) (Oral)  Resp 15  Ht 5' (1.524 m)  Wt 156 lb (70.761 kg)  BMI 30.47 kg/m2  SpO2 98% Physical Exam  Constitutional: She appears well-developed and well-nourished. No distress.  HENT:  Head: Normocephalic and atraumatic.  Mouth/Throat: Oropharynx is clear and moist. No oropharyngeal exudate.  Eyes: Conjunctivae and EOM are normal. Pupils are equal, round, and reactive to light. Right eye exhibits no discharge. Left eye exhibits no discharge. No scleral icterus.  Neck: Normal range of motion. Neck supple. No JVD present. No thyromegaly present.  Cardiovascular: Normal rate, regular rhythm and intact distal pulses.  Exam reveals no gallop and no friction rub.   Murmur ( soft systolic murmur) heard. Pulmonary/Chest: Effort normal and breath sounds normal. No respiratory distress. She has no wheezes. She has no rales.  Abdominal: Soft. Bowel sounds are normal. She exhibits no distension and no mass. There is no tenderness.  Musculoskeletal: Normal range of motion. She exhibits no edema or tenderness.  Lymphadenopathy:    She has no cervical adenopathy.  Neurological: She is alert. Coordination normal.   Skin: Skin is warm and dry. No rash noted. No erythema.  Psychiatric: She has a normal mood and affect. Her behavior is normal.  Nursing note and vitals reviewed.   ED Course  Procedures (including critical care time) Labs Review Labs Reviewed  CBC WITH DIFFERENTIAL/PLATELET - Abnormal; Notable for the following:    Hemoglobin 15.2 (*)    Lymphs Abs 4.7 (*)    All other components within normal limits  COMPREHENSIVE METABOLIC PANEL - Abnormal; Notable for the following:    Sodium 133 (*)    Potassium 3.2 (*)    Chloride 94 (*)    Glucose, Bld 302 (*)    BUN 21 (*)    Creatinine, Ser 1.47 (*)    Total Protein 8.3 (*)    AST 74 (*)    ALT 68 (*)    GFR calc non Af Amer 37 (*)    GFR calc Af Amer 43 (*)    All other components within normal limits  CBG MONITORING, ED - Abnormal; Notable for the following:  Glucose-Capillary 301 (*)    All other components within normal limits  TROPONIN I    Imaging Review Dg Chest 2 View  01/03/2015  CLINICAL DATA:  Shortness of breath.  Palpitations. EXAM: CHEST  2 VIEW COMPARISON:  11/11/2014 FINDINGS: Heart size and pulmonary vascularity are. No infiltrates or effusions. Tiny linear scarring in the lingula, stable. No significant osseous abnormality. IMPRESSION: No active cardiopulmonary disease. Electronically Signed   By: Lorriane Shire M.D.   On: 01/03/2015 08:14   I have personally reviewed and evaluated these images and lab results as part of my medical decision-making.   EKG Interpretation   Date/Time:  Monday January 03 2015 07:23:31 EST Ventricular Rate:  59 PR Interval:  205 QRS Duration: 97 QT Interval:  502 QTC Calculation: 497 R Axis:   14 Text Interpretation:  Sinus rhythm Probable left ventricular hypertrophy  Borderline prolonged QT interval since last tracing no significant change  Confirmed by Sabra Heck  MD, Reatha Sur ([id number]) on 01/03/2015 7:40:43 AM      MDM   Final diagnoses:  Palpitations  Hypokalemia   Renal insufficiency    The patient is well-appearing, she has no ectopy on the monitor or her EKG, the EKG does show signs of left ventricular hypertrophy, there is a prolonged QTc as well as a prolonged PR interval, both of these have been noted to be prolonged in the past as well. She is likely having episodes of ectopy though I don't see any at this time. We'll check labs, sugar, potassium, magnesium, chest x-ray, repeat blood pressure measurements as she is hypertensive on arrival.  Blood pressure has improved spontaneously without medications, the patient is persistently mildly bradycardic which I would expect with her use of metoprolol. She has no acute findings on EKG, there is been no ectopy witnessed on cardiac monitoring, she is resting comfortably. She does have low potassium which was corrected, magnesium was also given him of the patient will need follow-up in the outpatient setting to have this rechecked. She is in agreement with the plan and appears very comfortable, labs otherwise unremarkable  Chest x-ray negative for acute findings, troponin normal, kidney function at 1.47 is slightly higher than normal, IV fluids given. Liver function tests slightly elevated but consistent with prior values.  Meds given in ED:  Medications  potassium chloride 10 mEq in 100 mL IVPB (not administered)  sodium chloride 0.9 % bolus 500 mL (not administered)  aspirin chewable tablet 324 mg (324 mg Oral Given 01/03/15 0745)  magnesium sulfate IVPB 2 g 50 mL (2 g Intravenous New Bag/Given 01/03/15 0745)       Noemi Chapel, MD 01/03/15 [phone]

## 2015-01-03 NOTE — Discharge Instructions (Signed)

## 2015-01-03 NOTE — ED Notes (Signed)
Having heart palpations for last 2 days, denies pain but c/o SOB.

## 2015-01-20 ENCOUNTER — Other Ambulatory Visit: Payer: Self-pay

## 2015-01-20 ENCOUNTER — Encounter: Payer: Self-pay | Admitting: Gastroenterology

## 2015-01-20 ENCOUNTER — Ambulatory Visit (INDEPENDENT_AMBULATORY_CARE_PROVIDER_SITE_OTHER): Payer: Medicare Other | Admitting: Gastroenterology

## 2015-01-20 VITALS — BP 172/100 | HR 72 | Temp 98.1°F | Ht 60.0 in | Wt 159.0 lb

## 2015-01-20 DIAGNOSIS — B182 Chronic viral hepatitis C: Secondary | ICD-10-CM | POA: Diagnosis not present

## 2015-01-20 DIAGNOSIS — Z86010 Personal history of colonic polyps: Secondary | ICD-10-CM | POA: Diagnosis not present

## 2015-01-20 DIAGNOSIS — R1319 Other dysphagia: Secondary | ICD-10-CM

## 2015-01-20 MED ORDER — PEG 3350-KCL-NA BICARB-NACL 420 G PO SOLR: 4000.0000 mL | Freq: Once | ORAL | Status: DC

## 2015-01-20 NOTE — Progress Notes (Signed)
CC'ED TO PCP 

## 2015-01-20 NOTE — Assessment & Plan Note (Signed)
Due for early interval follow-up colonoscopy given inadequate bowel prep last year. Multiple advanced adenomas previously removed. She is ready to schedule at this time. We will plan on 2 full days of clear liquids. Will also start Linzess 257mg daily for 5 days prior to her bowel prep. She will receive 4 L of TriLyte leave the day before her procedure in 2 L the morning of.  Colonoscopy in the near future. Augment conscious sedation with Phenergan 12.5 mg IV 30 minutes before given history of alcohol use. I have discussed the risks, alternatives, benefits with regards to but not limited to the risk of reaction to medication, bleeding, infection, perforation and the patient is agreeable to proceed. Written consent to be obtained.

## 2015-01-20 NOTE — Assessment & Plan Note (Signed)
Hepatitis C, genotype 1B, treatment nave. MRI one year ago suggestive of cirrhosis however no evidence of portal hypertension on EGD last year. Continue to recommend cutting back on her alcohol consumption. Continue avoiding illicit drugs. We will have her come back in 2 months to reassess and consider pursuing hepatitis C treatment at that time if she continues to do well and shows compliance. Reviewed labs from recent ER evaluation as outlined.

## 2015-01-20 NOTE — Assessment & Plan Note (Signed)
Recent barium pill esophagram unremarkable. Recommend chewing food thoroughly. Using plenty of liquids with meals. Monitor symptoms. If progressive, consider esophageal manometry. Patient not interested at this point.

## 2015-01-20 NOTE — Patient Instructions (Addendum)
1. Colonoscopy as scheduled. Please see separate instructions. 2. Return to the office in two months to discuss Hepatitis C treatment.  3. Please cut back on alcohol consumption with goal of none. Combination of alcohol and hepatitis C can harm your liver.

## 2015-01-20 NOTE — Progress Notes (Signed)
Primary Care Physician:  Wenda Low, MD  Primary Gastroenterologist:  Garfield Cornea, MD   Chief Complaint  Patient presents with  . Colonoscopy    HPI:  Cassandra Jordan is a 64 y.o. female here for follow-up. She was last seen in November 2016. EGD and colonoscopy in June 2015 showed Schatzki ring with dilation, no esophageal varices, small hiatal hernia. Colonoscopy was for first ever high-risk screening. Multiple colonic polyps treated, inadequate preparation compromised exam, colonic diverticulosis. She had tubular and tubulovillous adenomas removed. She was supposed to have repeat colonoscopy December 2015 due to advanced adenomas and inadequate prep previously. 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  Patient has a history of hepatitis C, genotype 1B (treatment nave), significant alcohol use, chronic GERD, constipation.  MRI abdomen in July 2015 suggestive of early morphological changes of cirrhosis.  Barium pill esophagram November for ongoing dysphagia status post dilation showed no abnormalities.  Patient reports alternating constipation, diarrhea. No melena rectal bleeding. Mild dysphagia to solid foods at times. No nausea or vomiting. No weight loss. Consumes alcohol about once per week. He acknowledges drinking heavily at those times. Denies injectable or intranasal drug use. Remotely smoked crack. Last marijuana use in mid October.  Current Outpatient Prescriptions  Medication Sig Dispense Refill  . gabapentin (NEURONTIN) 300 MG capsule Take 1 capsule (300 mg total) by mouth 2 (two) times daily. 20 capsule 0  . insulin NPH Human (HUMULIN N,NOVOLIN N) 100 UNIT/ML injection Inject 3 Units into the skin 3 (three) times daily before meals.     Marland Kitchen lisinopril-hydrochlorothiazide (PRINZIDE,ZESTORETIC) 20-12.5 MG per tablet Take 1 tablet by mouth every morning.    . metoprolol tartrate (LOPRESSOR) 25 MG tablet Take 1 tablet (25 mg total) by  mouth 2 (two) times daily. 60 tablet 3  . PACERONE 100 MG tablet Take 100 mg by mouth daily.      No current facility-administered medications for this visit.    Allergies as of 01/20/2015 - Review Complete 01/03/2015  Allergen Reaction Noted  . Sulfa antibiotics Itching and Other (See Comments) 04/10/2012    Past Medical History  Diagnosis Date  . Type 2 diabetes mellitus ([place])   . Essential hypertension   . Asthma   . Neuropathic pain   . Hepatitis C   . Renal carcinoma ([place])   . Hyperlipidemia   . Arthritis   . Rash   . GERD (gastroesophageal reflux disease)   . PSVT (paroxysmal supraventricular tachycardia) ([place])   . Vaginal itching 10/01/2014  . Vaginal irritation 10/01/2014  . Yeast infection of the vagina 10/01/2014  . Lichen sclerosus et atrophicus 10/13/2014    Past Surgical History  Procedure Laterality Date  . Abdominal hysterectomy    . Wart removal    . Colonoscopy N/A 07/08/2013    RMR: Multiple colonic polyps treated [name] as described above. Inadequate prepartation comprimised examination there colonic diverticulosis. tubular and tubulovillous adenomas. next TCS 12/2013  . Esophagogastroduodenoscopy N/A 07/08/2013    RMR: Schatzki's ring ; small hiatal hernia-status post passage of a Maloney dilator  . Savory dilation N/A 07/08/2013    Procedure: SAVORY DILATION;  Surgeon: [name], MD;  [facility];  Service: Endoscopy;  Laterality: N/A;  [name] dilation N/A 07/08/2013    Procedure: [name] DILATION;  Surgeon: [name], MD;  [facility];  Service: Endoscopy;  Laterality: N/A;  . Tonsillectomy    . Robot assisted laparoscopic nephrectomy Right  10/14/2013    Procedure: ROBOTIC ASSISTED LAPAROSCOPIC RADICAL NEPHRECTOMY;  Surgeon: [name], MD;  Location: [facility];  Service: Urology;  Laterality: Right;    Family History  Problem Relation Age of Onset  . Colon cancer Sister 38  . Cancer Mother   . Heart attack Father    . Hypertension Daughter   . Cancer Maternal Grandfather   . Diabetes Paternal Grandmother     Social History   Social History  . Marital Status: Single    Spouse Name: N/A  . Number of Children: N/A  . Years of Education: N/A   Occupational History  . Not on file.   Social History Main Topics  . Smoking status: Current Every Day Smoker -- 0.10 packs/day for 40 years    Types: Cigarettes  . Smokeless tobacco: Never Used  . Alcohol Use: 0.0 oz/week    0 Standard drinks or equivalent per week     Comment: beer once or twice per month, 24-40 ounce.   . Drug Use: No     Comment: marijuana 10/2014. remote smoked crack.   . Sexual Activity: Not Currently    Birth Control/ Protection: Surgical     Comment: hyst   Other Topics Concern  . Not on file   Social History Narrative      ROS:  General: Negative for anorexia, weight loss, fever, chills, fatigue, weakness. Eyes: Negative for vision changes.  ENT: Negative for hoarseness, difficulty swallowing , nasal congestion. CV: Negative for chest pain, angina, palpitations, dyspnea on exertion, peripheral edema.  Respiratory: Negative for dyspnea at rest, dyspnea on exertion, cough, sputum, wheezing.  GI: See history of present illness. GU:  Negative for dysuria, hematuria, urinary incontinence, urinary frequency, nocturnal urination.  MS: Negative for joint pain, low back pain.  Derm: Negative for rash or itching.  Neuro: Negative for weakness, abnormal sensation, seizure, frequent headaches, memory loss, confusion.  Psych: Negative for anxiety, depression, suicidal ideation, hallucinations.  Endo: Negative for unusual weight change.  Heme: Negative for bruising or bleeding. Allergy: Negative for rash or hives.    Physical Examination:  BP 198/101 mmHg  Pulse 72  Temp(Src) 98.1 F (36.7 C) (Oral)  Ht 5' (1.524 m)  Wt 159 lb (72.122 kg)  BMI 31.05 kg/m2   General: Well-nourished, well-developed in no acute  distress.  Head: Normocephalic, atraumatic.   Eyes: Conjunctiva pink, no icterus. Mouth: Oropharyngeal mucosa moist and pink , no lesions erythema or exudate. Neck: Supple without thyromegaly, masses, or lymphadenopathy.  Lungs: Clear to auscultation bilaterally.  Heart: Regular rate and rhythm, no murmurs rubs or gallops.  Abdomen: Bowel sounds are normal, nontender, nondistended, no hepatosplenomegaly or masses, no abdominal bruits or    hernia , no rebound or guarding.   Rectal: Not performed Extremities: No lower extremity edema. No clubbing or deformities.  Neuro: Alert and oriented x 4 , grossly normal neurologically.  Skin: Warm and dry, no rash or jaundice.   Psych: Alert and cooperative, normal mood and affect.  Labs: Lab Results  Component Value Date   WBC 8.9 01/03/2015   HGB 15.2* 01/03/2015   HCT 45.5 01/03/2015   MCV 91.9 01/03/2015   PLT 188 01/03/2015   Lab Results  Component Value Date   CREATININE 1.47* 01/03/2015   BUN 21* 01/03/2015   NA 133* 01/03/2015   K 3.2* 01/03/2015   CL 94* 01/03/2015   CO2 31 01/03/2015   Lab Results  Component Value Date   ALT 68*  01/03/2015   AST 74* 01/03/2015   ALKPHOS 63 01/03/2015   BILITOT 0.8 01/03/2015     Imaging Studies: Dg Chest 2 View  01/03/2015  CLINICAL DATA:  Shortness of breath.  Palpitations. EXAM: CHEST  2 VIEW COMPARISON:  11/11/2014 FINDINGS: Heart size and pulmonary vascularity are. No infiltrates or effusions. Tiny linear scarring in the lingula, stable. No significant osseous abnormality. IMPRESSION: No active cardiopulmonary disease. Electronically Signed   By: Lorriane Shire M.D.   On: 01/03/2015 08:14

## 2015-02-02 ENCOUNTER — Other Ambulatory Visit: Payer: Self-pay

## 2015-02-02 DIAGNOSIS — Z86010 Personal history of colonic polyps: Secondary | ICD-10-CM

## 2015-02-09 ENCOUNTER — Encounter (HOSPITAL_COMMUNITY)
Admission: RE | Disposition: A | Discharge status: Home Health | Payer: Self-pay | Source: Ambulatory Visit | Attending: Internal Medicine

## 2015-02-09 ENCOUNTER — Ambulatory Visit (HOSPITAL_COMMUNITY)
Admission: RE | Admit: 2015-02-09 | Discharge: 2015-02-09 | Disposition: A | Discharge status: Home Health | Payer: Medicare Other | Source: Ambulatory Visit | Attending: Internal Medicine | Admitting: Internal Medicine

## 2015-02-09 ENCOUNTER — Encounter (HOSPITAL_COMMUNITY): Payer: Self-pay | Admitting: *Deleted

## 2015-02-09 DIAGNOSIS — Z794 Long term (current) use of insulin: Secondary | ICD-10-CM | POA: Diagnosis not present

## 2015-02-09 DIAGNOSIS — I10 Essential (primary) hypertension: Secondary | ICD-10-CM | POA: Insufficient documentation

## 2015-02-09 DIAGNOSIS — E119 Type 2 diabetes mellitus without complications: Secondary | ICD-10-CM | POA: Insufficient documentation

## 2015-02-09 DIAGNOSIS — M1991 Primary osteoarthritis, unspecified site: Secondary | ICD-10-CM | POA: Diagnosis not present

## 2015-02-09 DIAGNOSIS — Z86010 Personal history of colonic polyps: Secondary | ICD-10-CM | POA: Insufficient documentation

## 2015-02-09 DIAGNOSIS — Z905 Acquired absence of kidney: Secondary | ICD-10-CM | POA: Insufficient documentation

## 2015-02-09 DIAGNOSIS — E785 Hyperlipidemia, unspecified: Secondary | ICD-10-CM | POA: Insufficient documentation

## 2015-02-09 DIAGNOSIS — Z85528 Personal history of other malignant neoplasm of kidney: Secondary | ICD-10-CM | POA: Insufficient documentation

## 2015-02-09 DIAGNOSIS — K219 Gastro-esophageal reflux disease without esophagitis: Secondary | ICD-10-CM | POA: Diagnosis not present

## 2015-02-09 DIAGNOSIS — Z809 Family history of malignant neoplasm, unspecified: Secondary | ICD-10-CM | POA: Insufficient documentation

## 2015-02-09 DIAGNOSIS — Z79899 Other long term (current) drug therapy: Secondary | ICD-10-CM | POA: Diagnosis not present

## 2015-02-09 DIAGNOSIS — Z800 Family history of malignant neoplasm of digestive organs: Secondary | ICD-10-CM | POA: Diagnosis not present

## 2015-02-09 DIAGNOSIS — J45909 Unspecified asthma, uncomplicated: Secondary | ICD-10-CM | POA: Insufficient documentation

## 2015-02-09 DIAGNOSIS — Z1211 Encounter for screening for malignant neoplasm of colon: Secondary | ICD-10-CM | POA: Diagnosis not present

## 2015-02-09 DIAGNOSIS — D122 Benign neoplasm of ascending colon: Secondary | ICD-10-CM | POA: Diagnosis not present

## 2015-02-09 HISTORY — PX: COLONOSCOPY: SHX5424

## 2015-02-09 LAB — GLUCOSE, CAPILLARY: Glucose-Capillary: 90 mg/dL (ref 65–99)

## 2015-02-09 SURGERY — COLONOSCOPY
Anesthesia: Moderate Sedation

## 2015-02-09 MED ORDER — PROMETHAZINE HCL 25 MG/ML IJ SOLN
INTRAMUSCULAR | Status: DC
Start: 2015-02-09 — End: 2015-02-09
  Filled 2015-02-09: qty 1

## 2015-02-09 MED ORDER — MEPERIDINE HCL 100 MG/ML IJ SOLN
INTRAMUSCULAR | Status: AC
  Filled 2015-02-09: qty 2

## 2015-02-09 MED ORDER — SODIUM CHLORIDE 0.9 % IV SOLN
INTRAVENOUS | Status: DC
  Administered 2015-02-09: 1000 mL via INTRAVENOUS

## 2015-02-09 MED ORDER — SODIUM CHLORIDE 0.9 % IJ SOLN
INTRAMUSCULAR | Status: AC
  Filled 2015-02-09: qty 3

## 2015-02-09 MED ORDER — MIDAZOLAM HCL 5 MG/5ML IJ SOLN
INTRAMUSCULAR | Status: AC
  Filled 2015-02-09: qty 10

## 2015-02-09 MED ORDER — PROMETHAZINE HCL 25 MG/ML IJ SOLN
12.5000 mg | Freq: Once | INTRAMUSCULAR | Status: AC
  Administered 2015-02-09: 12.5 mg via INTRAVENOUS

## 2015-02-09 MED ORDER — MEPERIDINE HCL 100 MG/ML IJ SOLN
INTRAMUSCULAR | Status: DC | PRN
  Administered 2015-02-09: 50 mg via INTRAVENOUS

## 2015-02-09 MED ORDER — ONDANSETRON HCL 4 MG/2ML IJ SOLN
INTRAMUSCULAR | Status: AC
  Filled 2015-02-09: qty 2

## 2015-02-09 MED ORDER — ONDANSETRON HCL 4 MG/2ML IJ SOLN
INTRAMUSCULAR | Status: DC | PRN
  Administered 2015-02-09: 4 mg via INTRAVENOUS

## 2015-02-09 MED ORDER — STERILE WATER FOR IRRIGATION IR SOLN
Status: DC | PRN
  Administered 2015-02-09: 13:00:00

## 2015-02-09 MED ORDER — MIDAZOLAM HCL 5 MG/5ML IJ SOLN
INTRAMUSCULAR | Status: DC | PRN
  Administered 2015-02-09: 1 mg via INTRAVENOUS
  Administered 2015-02-09: 2 mg via INTRAVENOUS

## 2015-02-09 NOTE — Op Note (Signed)
[facility] [address] [place], [address]   COLONOSCOPY PROCEDURE REPORT  PATIENT: Mackenzie, Groom  MR#: [id number] BIRTHDATE: 1950/08/12 , 65  yrs. old GENDER: female ENDOSCOPIST: R.  Garfield Cornea, MD FACP [facility] REFERRED EQ:ASTMHD Lysle Rubens, M.D. PROCEDURE DATE:  February 22, 2015 PROCEDURE:   Colonoscopy with biopsy INDICATIONS:surveillance examination; history of colonic adenoma. MEDICATIONS: Versed 3 mg IV and Demerol 50 mg IV in divided doses. Phenergan 12.5 mg IV.  Zofran 4 mg IV. ASA CLASS:       Class II  CONSENT: The risks, benefits, alternatives and imponderables including but not limited to bleeding, perforation as well as the possibility of a missed lesion have been reviewed.  The potential for biopsy, lesion removal, etc. have also been discussed. Questions have been answered.  All parties agreeable.  Please see the history and physical in the medical record for more information.  DESCRIPTION OF PROCEDURE:   After the risks benefits and alternatives of the procedure were thoroughly explained, informed consent was obtained.  The digital rectal exam revealed no abnormalities of the rectum.   The EC-[id number] ([id number])  endoscope was introduced through the anus and advanced to the cecum, which was identified by both the appendix and ileocecal valve. No adverse events experienced.   The quality of the prep was adequate  The instrument was then slowly withdrawn as the colon was fully examined. Estimated blood loss is zero unless otherwise noted in this procedure report.      COLON FINDINGS: Rectal mucosa seen well on?"face.  Small rectal vault.  Unable to retroflex.  The mucosa was seen well.  Patient had (1) diminutive polyp in the mid ascending segment; otherwise, the remainder of the colonic mucosa appeared normal.  There was quite a bit of viscous liquid stool on the right side which had to be washed and suctioned copiously to gain adequate  views. Retroflexion was not performed. .  The above-mentioned polyp was cold biopsy removed.  Withdrawal time=10 minutes 0 seconds.  The scope was withdrawn and the procedure completed. COMPLICATIONS: There were no immediate complications. EBL 2 mL ENDOSCOPIC IMPRESSION: Colonic polyp?"removed as described above  RECOMMENDATIONS: Follow-up on pathology. Office visit with [place] in 6 weeks  eSigned:  R. Garfield Cornea, MD Rosalita Chessman [facility] February 22, 2015 1:15 PM   cc:  CPT CODES: ICD CODES:  The ICD and CPT codes recommended by this software are interpretations from the data that the clinical staff has captured with the software.  The verification of the translation of this report to the ICD and CPT codes and modifiers is the sole responsibility of the health care institution and practicing physician where this report was generated.  [place]. will not be held responsible for the validity of the ICD and CPT codes included on this report.  AMA assumes no liability for data contained or not contained herein. CPT is a Designer, television/film set of the [employer].  PATIENT NAME:  Delayni, Streed MR#: [id number]

## 2015-02-09 NOTE — Discharge Instructions (Signed)
Colonoscopy Discharge Instructions  Read the instructions outlined below and refer to this sheet in the next few weeks. These discharge instructions provide you with general information on caring for yourself after you leave the hospital. Your doctor may also give you specific instructions. While your treatment has been planned according to the most current medical practices available, unavoidable complications occasionally occur. If you have any problems or questions after discharge, call Dr. Gala Romney at [phone]. ACTIVITY  You may resume your regular activity, but move at a slower pace for the next 24 hours.   Take frequent rest periods for the next 24 hours.   Walking will help get rid of the air and reduce the bloated feeling in your belly (abdomen).   No driving for 24 hours (because of the medicine (anesthesia) used during the test).    Do not sign any important legal documents or operate any machinery for 24 hours (because of the anesthesia used during the test).  NUTRITION  Drink plenty of fluids.   You may resume your normal diet as instructed by your doctor.   Begin with a light meal and progress to your normal diet. Heavy or fried foods are harder to digest and may make you feel sick to your stomach (nauseated).   Avoid alcoholic beverages for 24 hours or as instructed.  MEDICATIONS  You may resume your normal medications unless your doctor tells you otherwise.  WHAT YOU CAN EXPECT TODAY  Some feelings of bloating in the abdomen.   Passage of more gas than usual.   Spotting of blood in your stool or on the toilet paper.  IF YOU HAD POLYPS REMOVED DURING THE COLONOSCOPY:  No aspirin products for 7 days or as instructed.   No alcohol for 7 days or as instructed.   Eat a soft diet for the next 24 hours.  FINDING OUT THE RESULTS OF YOUR TEST Not all test results are available during your visit. If your test results are not back during the visit, make an appointment  with your caregiver to find out the results. Do not assume everything is normal if you have not heard from your caregiver or the medical facility. It is important for you to follow up on all of your test results.  SEEK IMMEDIATE MEDICAL ATTENTION IF:  You have more than a spotting of blood in your stool.   Your belly is swollen (abdominal distention).   You are nauseated or vomiting.   You have a temperature over 101.   You have abdominal pain or discomfort that is severe or gets worse throughout the day.   Follow-up information provided  Further recommendations pending review of pathology report  Office visit with [place] in about 6 weeks   Colon Polyps Polyps are lumps of extra tissue growing inside the body. Polyps can grow in the large intestine (colon). Most colon polyps are noncancerous (benign). However, some colon polyps can become cancerous over time. Polyps that are larger than a pea may be harmful. To be safe, caregivers remove and test all polyps. CAUSES  Polyps form when mutations in the genes cause your cells to grow and divide even though no more tissue is needed. RISK FACTORS There are a number of risk factors that can increase your chances of getting colon polyps. They include:  Being older than 50 years.  Family history of colon polyps or colon cancer.  Long-term colon diseases, such as colitis or Crohn disease.  Being overweight.  Smoking.  Being  inactive.  Drinking too much alcohol. SYMPTOMS  Most small polyps do not cause symptoms. If symptoms are present, they may include:  Blood in the stool. The stool may look dark red or black.  Constipation or diarrhea that lasts longer than 1 week. DIAGNOSIS People often do not know they have polyps until their caregiver finds them during a regular checkup. Your caregiver can use 4 tests to check for polyps:  Digital rectal exam. The caregiver wears gloves and feels inside the rectum. This test would find polyps  only in the rectum.  Barium enema. The caregiver puts a liquid called barium into your rectum before taking X-rays of your colon. Barium makes your colon look white. Polyps are dark, so they are easy to see in the X-ray pictures.  Sigmoidoscopy. A thin, flexible tube (sigmoidoscope) is placed into your rectum. The sigmoidoscope has a light and tiny camera in it. The caregiver uses the sigmoidoscope to look at the last third of your colon.  Colonoscopy. This test is like sigmoidoscopy, but the caregiver looks at the entire colon. This is the most common method for finding and removing polyps. TREATMENT  Any polyps will be removed during a sigmoidoscopy or colonoscopy. The polyps are then tested for cancer. PREVENTION  To help lower your risk of getting more colon polyps:  Eat plenty of fruits and vegetables. Avoid eating fatty foods.  Do not smoke.  Avoid drinking alcohol.  Exercise every day.  Lose weight if recommended by your caregiver.  Eat plenty of calcium and folate. Foods that are rich in calcium include milk, cheese, and broccoli. Foods that are rich in folate include chickpeas, kidney beans, and spinach. HOME CARE INSTRUCTIONS Keep all follow-up appointments as directed by your caregiver. You may need periodic exams to check for polyps. SEEK MEDICAL CARE IF: You notice bleeding during a bowel movement.   This information is not intended to replace advice given to you by your health care provider. Make sure you discuss any questions you have with your health care provider.

## 2015-02-09 NOTE — Interval H&P Note (Signed)
History and Physical Interval Note:  02/09/2015 12:40 PM  Cassandra Jordan  has presented today for surgery, with the diagnosis of history of colon adenoma  The various methods of treatment have been discussed with the patient and family. After consideration of risks, benefits and other options for treatment, the patient has consented to  Procedure(s) with comments: COLONOSCOPY (N/A) - 1245pm as a surgical intervention .  The patient's history has been reviewed, patient examined, no change in status, stable for surgery.  I have reviewed the patient's chart and labs.  Questions were answered to the patient's satisfaction.     Cassandra Jordan  No change. Surveillance colonoscopy per plan.  The risks, benefits, limitations, alternatives and imponderables have been reviewed with the patient. Questions have been answered. All parties are agreeable.

## 2015-02-09 NOTE — H&P (View-Only) (Signed)
Primary Care Physician:  Wenda Low, MD  Primary Gastroenterologist:  Garfield Cornea, MD   Chief Complaint  Patient presents with  . Colonoscopy    HPI:  Cassandra Jordan is a 65 y.o. female here for follow-up. She was last seen in November 2016. EGD and colonoscopy in June 2015 showed Schatzki ring with dilation, no esophageal varices, small hiatal hernia. Colonoscopy was for first ever high-risk screening. Multiple colonic polyps treated, inadequate preparation compromised exam, colonic diverticulosis. She had tubular and tubulovillous adenomas removed. She was supposed to have repeat colonoscopy December 2015 due to advanced adenomas and inadequate prep previously. 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  Patient has a history of hepatitis C, genotype 1B (treatment nave), significant alcohol use, chronic GERD, constipation.  MRI abdomen in July 2015 suggestive of early morphological changes of cirrhosis.  Barium pill esophagram November for ongoing dysphagia status post dilation showed no abnormalities.  Patient reports alternating constipation, diarrhea. No melena rectal bleeding. Mild dysphagia to solid foods at times. No nausea or vomiting. No weight loss. Consumes alcohol about once per week. He acknowledges drinking heavily at those times. Denies injectable or intranasal drug use. Remotely smoked crack. Last marijuana use in mid October.  Current Outpatient Prescriptions  Medication Sig Dispense Refill  . gabapentin (NEURONTIN) 300 MG capsule Take 1 capsule (300 mg total) by mouth 2 (two) times daily. 20 capsule 0  . insulin NPH Human (HUMULIN N,NOVOLIN N) 100 UNIT/ML injection Inject 3 Units into the skin 3 (three) times daily before meals.     Marland Kitchen lisinopril-hydrochlorothiazide (PRINZIDE,ZESTORETIC) 20-12.5 MG per tablet Take 1 tablet by mouth every morning.    . metoprolol tartrate (LOPRESSOR) 25 MG tablet Take 1 tablet (25 mg total) by  mouth 2 (two) times daily. 60 tablet 3  . PACERONE 100 MG tablet Take 100 mg by mouth daily.      No current facility-administered medications for this visit.    Allergies as of 01/20/2015 - Review Complete 01/03/2015  Allergen Reaction Noted  . Sulfa antibiotics Itching and Other (See Comments) 04/10/2012    Past Medical History  Diagnosis Date  . Type 2 diabetes mellitus ([place])   . Essential hypertension   . Asthma   . Neuropathic pain   . Hepatitis C   . Renal carcinoma ([place])   . Hyperlipidemia   . Arthritis   . Rash   . GERD (gastroesophageal reflux disease)   . PSVT (paroxysmal supraventricular tachycardia) ([place])   . Vaginal itching 10/01/2014  . Vaginal irritation 10/01/2014  . Yeast infection of the vagina 10/01/2014  . Lichen sclerosus et atrophicus 10/13/2014    Past Surgical History  Procedure Laterality Date  . Abdominal hysterectomy    . Wart removal    . Colonoscopy N/A 07/08/2013    RMR: Multiple colonic polyps treated [name] as described above. Inadequate prepartation comprimised examination there colonic diverticulosis. tubular and tubulovillous adenomas. next TCS 12/2013  . Esophagogastroduodenoscopy N/A 07/08/2013    RMR: Schatzki's ring ; small hiatal hernia-status post passage of a Maloney dilator  . Savory dilation N/A 07/08/2013    Procedure: SAVORY DILATION;  Surgeon: [name], MD;  Location: [facility];  Service: Endoscopy;  Laterality: N/A;  [name] dilation N/A 07/08/2013    Procedure: [name] DILATION;  Surgeon: [name], MD;  Location: [facility];  Service: Endoscopy;  Laterality: N/A;  . Tonsillectomy    . Robot assisted laparoscopic nephrectomy Right  10/14/2013    Procedure: ROBOTIC ASSISTED LAPAROSCOPIC RADICAL NEPHRECTOMY;  Surgeon: [name], MD;  Location: [facility];  Service: Urology;  Laterality: Right;    Family History  Problem Relation Age of Onset  . Colon cancer Sister 63  . Cancer Mother   . Heart attack Father    . Hypertension Daughter   . Cancer Maternal Grandfather   . Diabetes Paternal Grandmother     Social History   Social History  . Marital Status: Single    Spouse Name: N/A  . Number of Children: N/A  . Years of Education: N/A   Occupational History  . Not on file.   Social History Main Topics  . Smoking status: Current Every Day Smoker -- 0.10 packs/day for 40 years    Types: Cigarettes  . Smokeless tobacco: Never Used  . Alcohol Use: 0.0 oz/week    0 Standard drinks or equivalent per week     Comment: beer once or twice per month, 24-40 ounce.   . Drug Use: No     Comment: marijuana 10/2014. remote smoked crack.   . Sexual Activity: Not Currently    Birth Control/ Protection: Surgical     Comment: hyst   Other Topics Concern  . Not on file   Social History Narrative      ROS:  General: Negative for anorexia, weight loss, fever, chills, fatigue, weakness. Eyes: Negative for vision changes.  ENT: Negative for hoarseness, difficulty swallowing , nasal congestion. CV: Negative for chest pain, angina, palpitations, dyspnea on exertion, peripheral edema.  Respiratory: Negative for dyspnea at rest, dyspnea on exertion, cough, sputum, wheezing.  GI: See history of present illness. GU:  Negative for dysuria, hematuria, urinary incontinence, urinary frequency, nocturnal urination.  MS: Negative for joint pain, low back pain.  Derm: Negative for rash or itching.  Neuro: Negative for weakness, abnormal sensation, seizure, frequent headaches, memory loss, confusion.  Psych: Negative for anxiety, depression, suicidal ideation, hallucinations.  Endo: Negative for unusual weight change.  Heme: Negative for bruising or bleeding. Allergy: Negative for rash or hives.    Physical Examination:  BP 198/101 mmHg  Pulse 72  Temp(Src) 98.1 F (36.7 C) (Oral)  Ht 5' (1.524 m)  Wt 159 lb (72.122 kg)  BMI 31.05 kg/m2   General: Well-nourished, well-developed in no acute  distress.  Head: Normocephalic, atraumatic.   Eyes: Conjunctiva pink, no icterus. Mouth: Oropharyngeal mucosa moist and pink , no lesions erythema or exudate. Neck: Supple without thyromegaly, masses, or lymphadenopathy.  Lungs: Clear to auscultation bilaterally.  Heart: Regular rate and rhythm, no murmurs rubs or gallops.  Abdomen: Bowel sounds are normal, nontender, nondistended, no hepatosplenomegaly or masses, no abdominal bruits or    hernia , no rebound or guarding.   Rectal: Not performed Extremities: No lower extremity edema. No clubbing or deformities.  Neuro: Alert and oriented x 4 , grossly normal neurologically.  Skin: Warm and dry, no rash or jaundice.   Psych: Alert and cooperative, normal mood and affect.  Labs: Lab Results  Component Value Date   WBC 8.9 01/03/2015   HGB 15.2* 01/03/2015   HCT 45.5 01/03/2015   MCV 91.9 01/03/2015   PLT 188 01/03/2015   Lab Results  Component Value Date   CREATININE 1.47* 01/03/2015   BUN 21* 01/03/2015   NA 133* 01/03/2015   K 3.2* 01/03/2015   CL 94* 01/03/2015   CO2 31 01/03/2015   Lab Results  Component Value Date   ALT 68*  01/03/2015   AST 74* 01/03/2015   ALKPHOS 63 01/03/2015   BILITOT 0.8 01/03/2015     Imaging Studies: Dg Chest 2 View  01/03/2015  CLINICAL DATA:  Shortness of breath.  Palpitations. EXAM: CHEST  2 VIEW COMPARISON:  11/11/2014 FINDINGS: Heart size and pulmonary vascularity are. No infiltrates or effusions. Tiny linear scarring in the lingula, stable. No significant osseous abnormality. IMPRESSION: No active cardiopulmonary disease. Electronically Signed   By: Lorriane Shire M.D.   On: 01/03/2015 08:14

## 2015-02-13 ENCOUNTER — Encounter: Payer: Self-pay | Admitting: Internal Medicine

## 2015-02-14 ENCOUNTER — Encounter (HOSPITAL_COMMUNITY): Payer: Self-pay | Admitting: Internal Medicine

## 2015-02-16 ENCOUNTER — Other Ambulatory Visit: Payer: Self-pay | Admitting: Cardiology

## 2015-02-28 DIAGNOSIS — E782 Mixed hyperlipidemia: Secondary | ICD-10-CM | POA: Diagnosis not present

## 2015-02-28 DIAGNOSIS — Z0000 Encounter for general adult medical examination without abnormal findings: Secondary | ICD-10-CM | POA: Diagnosis not present

## 2015-02-28 DIAGNOSIS — F17210 Nicotine dependence, cigarettes, uncomplicated: Secondary | ICD-10-CM | POA: Diagnosis not present

## 2015-02-28 DIAGNOSIS — C641 Malignant neoplasm of right kidney, except renal pelvis: Secondary | ICD-10-CM | POA: Diagnosis not present

## 2015-02-28 DIAGNOSIS — Z23 Encounter for immunization: Secondary | ICD-10-CM | POA: Diagnosis not present

## 2015-02-28 DIAGNOSIS — B1920 Unspecified viral hepatitis C without hepatic coma: Secondary | ICD-10-CM | POA: Diagnosis not present

## 2015-02-28 DIAGNOSIS — I10 Essential (primary) hypertension: Secondary | ICD-10-CM | POA: Diagnosis not present

## 2015-02-28 DIAGNOSIS — M79674 Pain in right toe(s): Secondary | ICD-10-CM | POA: Diagnosis not present

## 2015-02-28 DIAGNOSIS — E1140 Type 2 diabetes mellitus with diabetic neuropathy, unspecified: Secondary | ICD-10-CM | POA: Diagnosis not present

## 2015-03-22 ENCOUNTER — Ambulatory Visit: Payer: Medicare Other | Admitting: Gastroenterology

## 2015-03-22 ENCOUNTER — Encounter: Payer: Self-pay | Admitting: Gastroenterology

## 2015-03-22 ENCOUNTER — Telehealth: Payer: Self-pay | Admitting: Gastroenterology

## 2015-03-22 NOTE — Telephone Encounter (Signed)
PATIENT WAS A NO SHOW AND LETTER SENT  °

## 2015-04-05 DIAGNOSIS — Z23 Encounter for immunization: Secondary | ICD-10-CM | POA: Diagnosis not present

## 2015-04-08 ENCOUNTER — Encounter: Payer: Self-pay | Admitting: Gastroenterology

## 2015-04-08 ENCOUNTER — Ambulatory Visit (INDEPENDENT_AMBULATORY_CARE_PROVIDER_SITE_OTHER): Payer: Medicare Other | Admitting: Gastroenterology

## 2015-04-08 VITALS — BP 186/105 | HR 75 | Temp 97.4°F | Ht 60.0 in | Wt 156.2 lb

## 2015-04-08 DIAGNOSIS — K7460 Unspecified cirrhosis of liver: Secondary | ICD-10-CM | POA: Diagnosis not present

## 2015-04-08 DIAGNOSIS — B182 Chronic viral hepatitis C: Secondary | ICD-10-CM

## 2015-04-08 DIAGNOSIS — Z86010 Personal history of colonic polyps: Secondary | ICD-10-CM

## 2015-04-08 NOTE — Progress Notes (Signed)
Primary Care Physician: Wenda Low, MD  Primary Gastroenterologist:  Garfield Cornea, MD   Chief Complaint  Patient presents with  . Follow-up    HPI: Cassandra Jordan is a 65 y.o. female here for follow-up. She underwent a colonoscopy in January for history of colonic adenoma. She had a single tubular adenoma removed. Next colonoscopy planned in January 2022. Has had poor but adequate prep on last two colonoscopies. Patient also has a history of hepatitis C, genotype 1B (treatment nave), significant alcohol use, chronic GERD, constipation. MRI of the abdomen July 2015 suggestive of early morphological changes of cirrhosis. GED in June 2015 showed Schatzki ring status post dilation, no esophageal varices, small hiatal hernia.  Patient states she is doing well. She denies any problems with her bowels. No blood in the stool or melena. Denies nausea or vomiting. Continues to consume beer, approximately 140 ounce beer 3 times monthly. Remotely smoked crack. Denies injectable or intranasal drug use. Denies recent marijuana use stating mid October as last use.  Discussed consideration of hepatitis C treatment at this time. Patient states she is currently receiving hepatitis vaccinations via her PCP. We have requested records. We discussed the expense of treatment, the likelihood that she would be approved and received significant assistance with coverage. We discussed need for compliance. Patient tells me at this time to she does not want to pursue hepatitis C treatment.  Current Outpatient Prescriptions  Medication Sig Dispense Refill  . gabapentin (NEURONTIN) 300 MG capsule Take 1 capsule (300 mg total) by mouth 2 (two) times daily. 20 capsule 0  . insulin NPH Human (HUMULIN N,NOVOLIN N) 100 UNIT/ML injection Inject 3 Units into the skin 3 (three) times daily before meals.     Marland Kitchen lisinopril-hydrochlorothiazide (PRINZIDE,ZESTORETIC) 20-12.5 MG per tablet Take 1 tablet by mouth every  morning.    . metoprolol tartrate (LOPRESSOR) 25 MG tablet Take 1 tablet (25 mg total) by mouth 2 (two) times daily. 60 tablet 3  . PACERONE 100 MG tablet Take 100 mg by mouth daily.      No current facility-administered medications for this visit.    Allergies as of 04/08/2015 - Review Complete 04/08/2015  Allergen Reaction Noted  . Sulfa antibiotics Itching and Other (See Comments) 04/10/2012    ROS:  General: Negative for anorexia, weight loss, fever, chills, fatigue, weakness. ENT: Negative for hoarseness, difficulty swallowing , nasal congestion. CV: Negative for chest pain, angina, palpitations, dyspnea on exertion, peripheral edema.  Respiratory: Negative for dyspnea at rest, dyspnea on exertion, cough, sputum, wheezing.  GI: See history of present illness. GU:  Negative for dysuria, hematuria, urinary incontinence, urinary frequency, nocturnal urination.  Endo: Negative for unusual weight change.    Physical Examination:   BP 186/105 mmHg  Pulse 75  Temp(Src) 97.4 F (36.3 C)  Ht 5' (1.524 m)  Wt 156 lb 3.2 oz (70.852 kg)  BMI 30.51 kg/m2  General: Well-nourished, well-developed in no acute distress.  Eyes: No icterus. Mouth: Oropharyngeal mucosa moist and pink , no lesions erythema or exudate. Lungs: Clear to auscultation bilaterally.  Heart: Regular rate and rhythm, no murmurs rubs or gallops.  Abdomen: Bowel sounds are normal, nontender, nondistended, no hepatosplenomegaly or masses, no abdominal bruits or hernia , no rebound or guarding.   Extremities: No lower extremity edema. No clubbing or deformities. Neuro: Alert and oriented x 4   Skin: Warm and dry, no jaundice.   Psych: Alert and cooperative, normal mood and affect.  Labs:  Lab Results  Component Value Date   CREATININE 1.47* 01/03/2015   BUN 21* 01/03/2015   NA 133* 01/03/2015   K 3.2* 01/03/2015   CL 94* 01/03/2015   CO2 31 01/03/2015   Lab Results  Component Value Date   ALT 68* 01/03/2015    AST 74* 01/03/2015   ALKPHOS 63 01/03/2015   BILITOT 0.8 01/03/2015   Lab Results  Component Value Date   INR 0.95 03/20/2014   Lab Results  Component Value Date   WBC 8.9 01/03/2015   HGB 15.2* 01/03/2015   HCT 45.5 01/03/2015   MCV 91.9 01/03/2015   PLT 188 01/03/2015    Imaging Studies: No results found.

## 2015-04-08 NOTE — Assessment & Plan Note (Signed)
Hepatitis C, genotype 1B, treatment nave. MRI one year ago suggestive of cirrhosis. No evidence of portal hypertension on EGD last year. She continues to use alcohol almost weekly although has cut back significantly. She denies ongoing illicit drug use. She tells me that she is not interested in pursuing hepatitis C treatment at this time feeling that she is not ready. She is interested in pursuing ongoing management of cirrhosis. She is due for ultrasound at this time. Her labs are currently up-to-date. We have requested records from PCP to determine what vaccinations she is currently receiving. She'll come back to the office in 3 months or sooner if needed.

## 2015-04-08 NOTE — Patient Instructions (Signed)
Please have your ultrasound done. Return to the office in 3 months. Call sooner if you decide you want to start Hepatitis C treatment.

## 2015-04-11 ENCOUNTER — Encounter: Payer: Self-pay | Admitting: Internal Medicine

## 2015-04-11 NOTE — Progress Notes (Signed)
CC'D TO PCP °

## 2015-04-13 ENCOUNTER — Ambulatory Visit (HOSPITAL_COMMUNITY)
Admission: RE | Admit: 2015-04-13 | Discharge: 2015-04-13 | Disposition: A | Discharge status: Home / Self Care | Payer: Medicare Other | Source: Ambulatory Visit | Attending: Gastroenterology | Admitting: Gastroenterology

## 2015-04-13 DIAGNOSIS — B182 Chronic viral hepatitis C: Secondary | ICD-10-CM | POA: Diagnosis not present

## 2015-04-13 DIAGNOSIS — Z86010 Personal history of colonic polyps: Secondary | ICD-10-CM | POA: Diagnosis not present

## 2015-04-13 DIAGNOSIS — K7460 Unspecified cirrhosis of liver: Secondary | ICD-10-CM | POA: Insufficient documentation

## 2015-04-13 DIAGNOSIS — K8020 Calculus of gallbladder without cholecystitis without obstruction: Secondary | ICD-10-CM | POA: Diagnosis not present

## 2015-04-13 DIAGNOSIS — Z905 Acquired absence of kidney: Secondary | ICD-10-CM | POA: Diagnosis not present

## 2015-04-24 NOTE — Progress Notes (Signed)
Quick Note:  Patient needs a MRI Abd with/without contrast to evaluate pancreatic head abnormality. She will need updated creatinine as well. ______

## 2015-04-25 ENCOUNTER — Other Ambulatory Visit: Payer: Self-pay

## 2015-04-25 DIAGNOSIS — Q453 Other congenital malformations of pancreas and pancreatic duct: Secondary | ICD-10-CM

## 2015-04-25 NOTE — Progress Notes (Signed)
Quick Note:  Pt is aware and I am routing to [facility] to schedule. ______

## 2015-04-25 NOTE — Progress Notes (Signed)
Quick Note:  . ______

## 2015-05-02 ENCOUNTER — Other Ambulatory Visit: Payer: Self-pay

## 2015-05-02 DIAGNOSIS — Q453 Other congenital malformations of pancreas and pancreatic duct: Secondary | ICD-10-CM | POA: Diagnosis not present

## 2015-05-03 ENCOUNTER — Other Ambulatory Visit: Payer: Self-pay | Admitting: Gastroenterology

## 2015-05-03 ENCOUNTER — Ambulatory Visit (HOSPITAL_COMMUNITY)
Admission: RE | Admit: 2015-05-03 | Discharge: 2015-05-03 | Disposition: A | Discharge status: Home / Self Care | Payer: Medicare Other | Source: Ambulatory Visit | Attending: Gastroenterology | Admitting: Gastroenterology

## 2015-05-03 DIAGNOSIS — Q453 Other congenital malformations of pancreas and pancreatic duct: Secondary | ICD-10-CM

## 2015-05-03 DIAGNOSIS — K760 Fatty (change of) liver, not elsewhere classified: Secondary | ICD-10-CM | POA: Diagnosis not present

## 2015-05-03 DIAGNOSIS — Z905 Acquired absence of kidney: Secondary | ICD-10-CM | POA: Diagnosis not present

## 2015-05-03 DIAGNOSIS — R933 Abnormal findings on diagnostic imaging of other parts of digestive tract: Secondary | ICD-10-CM | POA: Insufficient documentation

## 2015-05-03 DIAGNOSIS — K869 Disease of pancreas, unspecified: Secondary | ICD-10-CM | POA: Diagnosis not present

## 2015-05-03 LAB — CREATININE, SERUM: Creat: 1.04 mg/dL — ABNORMAL HIGH (ref 0.50–0.99)

## 2015-05-03 MED ORDER — GADOBENATE DIMEGLUMINE 529 MG/ML IV SOLN
15.0000 mL | Freq: Once | INTRAVENOUS | Status: AC | PRN
  Administered 2015-05-03: 15 mL via INTRAVENOUS

## 2015-05-04 NOTE — Progress Notes (Signed)
Quick Note:  NO pancreatic mass. Further recommendations per Magda Paganini. ______

## 2015-05-10 ENCOUNTER — Other Ambulatory Visit: Payer: Self-pay | Admitting: Cardiology

## 2015-06-03 NOTE — Progress Notes (Signed)
Labs from outside source from 2 2017 Hemoglobin A1c 7.1, white blood cell count 7700, hemoglobin 14, platelets 173,000, creatinine 1.14, total bilirubin 0.4, alkaline phosphatase 64, AST 61, ALT 63, albumin 3.6  Also had vaccination records where she received a dose of hepatitis B vaccination on 02/28/2015 and 04/05/2015.

## 2015-06-10 NOTE — Progress Notes (Signed)
Quick Note:  No pancreatic mass on MRI, none on CT 10/2014 at [facility].  Would recommend abd u/s 08/2015 (f/u pancreatic mass, cirrhosis) ______

## 2015-07-12 ENCOUNTER — Ambulatory Visit: Payer: Medicare Other | Admitting: Gastroenterology

## 2015-07-30 ENCOUNTER — Emergency Department (HOSPITAL_COMMUNITY)
Admission: EM | Admit: 2015-07-30 | Discharge: 2015-07-30 | Disposition: A | Discharge status: Home / Self Care | Payer: Medicare Other | Attending: Emergency Medicine | Admitting: Emergency Medicine

## 2015-07-30 ENCOUNTER — Encounter (HOSPITAL_COMMUNITY): Payer: Self-pay | Admitting: *Deleted

## 2015-07-30 DIAGNOSIS — J45909 Unspecified asthma, uncomplicated: Secondary | ICD-10-CM | POA: Diagnosis not present

## 2015-07-30 DIAGNOSIS — E119 Type 2 diabetes mellitus without complications: Secondary | ICD-10-CM | POA: Diagnosis not present

## 2015-07-30 DIAGNOSIS — M1990 Unspecified osteoarthritis, unspecified site: Secondary | ICD-10-CM | POA: Insufficient documentation

## 2015-07-30 DIAGNOSIS — M549 Dorsalgia, unspecified: Secondary | ICD-10-CM | POA: Insufficient documentation

## 2015-07-30 DIAGNOSIS — M79605 Pain in left leg: Secondary | ICD-10-CM | POA: Diagnosis present

## 2015-07-30 DIAGNOSIS — M5432 Sciatica, left side: Secondary | ICD-10-CM | POA: Insufficient documentation

## 2015-07-30 DIAGNOSIS — Z7984 Long term (current) use of oral hypoglycemic drugs: Secondary | ICD-10-CM | POA: Insufficient documentation

## 2015-07-30 DIAGNOSIS — E785 Hyperlipidemia, unspecified: Secondary | ICD-10-CM | POA: Insufficient documentation

## 2015-07-30 DIAGNOSIS — F17210 Nicotine dependence, cigarettes, uncomplicated: Secondary | ICD-10-CM | POA: Diagnosis not present

## 2015-07-30 DIAGNOSIS — Z79899 Other long term (current) drug therapy: Secondary | ICD-10-CM | POA: Diagnosis not present

## 2015-07-30 DIAGNOSIS — I10 Essential (primary) hypertension: Secondary | ICD-10-CM | POA: Insufficient documentation

## 2015-07-30 DIAGNOSIS — M5442 Lumbago with sciatica, left side: Secondary | ICD-10-CM | POA: Diagnosis not present

## 2015-07-30 MED ORDER — HYDROCODONE-ACETAMINOPHEN 5-325 MG PO TABS: ORAL_TABLET | ORAL | Status: DC

## 2015-07-30 MED ORDER — METHOCARBAMOL 500 MG PO TABS
500.0000 mg | ORAL_TABLET | Freq: Once | ORAL | Status: AC
  Administered 2015-07-30: 500 mg via ORAL
  Filled 2015-07-30: qty 1

## 2015-07-30 MED ORDER — METHOCARBAMOL 500 MG PO TABS: 500.0000 mg | ORAL_TABLET | Freq: Three times a day (TID) | ORAL | Status: DC

## 2015-07-30 MED ORDER — NAPROXEN 500 MG PO TABS: 500.0000 mg | ORAL_TABLET | Freq: Two times a day (BID) | ORAL | Status: DC

## 2015-07-30 NOTE — Discharge Instructions (Signed)
Sciatica Sciatica is pain, weakness, numbness, or tingling along your sciatic nerve. The nerve starts in the lower back and runs down the back of each leg. Nerve damage or certain conditions pinch or put pressure on the sciatic nerve. This causes the pain, weakness, and other discomforts of sciatica. HOME CARE   Only take medicine as told by your doctor.  Apply ice to the affected area for 20 minutes. Do this 3-4 times a day for the first 48-72 hours. Then try heat in the same way.  Exercise, stretch, or do your usual activities if these do not make your pain worse.  Go to physical therapy as told by your doctor.  Keep all doctor visits as told.  Do not wear high heels or shoes that are not supportive.  Get a firm mattress if your mattress is too soft to lessen pain and discomfort. GET HELP RIGHT AWAY IF:   You cannot control when you poop (bowel movement) or pee (urinate).  You have more weakness in your lower back, lower belly (pelvis), butt (buttocks), or legs.  You have redness or puffiness (swelling) of your back.  You have a burning feeling when you pee.  You have pain that gets worse when you lie down.  You have pain that wakes you from your sleep.  Your pain is worse than past pain.  Your pain lasts longer than 4 weeks.  You are suddenly losing weight without reason. MAKE SURE YOU:   Understand these instructions.  Will watch this condition.  Will get help right away if you are not doing well or get worse.   This information is not intended to replace advice given to you by your health care provider. Make sure you discuss any questions you have with your health care provider.   Document Released: 10/18/2007 Document Revised: 09/29/2014 Document Reviewed: 05/20/2011 Elsevier Interactive Patient Education [employer].

## 2015-07-30 NOTE — ED Notes (Signed)
Pt comes in with left leg pain that moves down her entire left leg. Denies any back pain or any injury to that area. Pt is able to ambulate.

## 2015-08-01 ENCOUNTER — Other Ambulatory Visit: Payer: Self-pay

## 2015-08-01 ENCOUNTER — Telehealth: Payer: Self-pay | Admitting: Internal Medicine

## 2015-08-01 DIAGNOSIS — B182 Chronic viral hepatitis C: Secondary | ICD-10-CM

## 2015-08-01 NOTE — Telephone Encounter (Signed)
Letter mailed with appointment and she is aware

## 2015-08-01 NOTE — Telephone Encounter (Signed)
RECALL FOR ABDOMINAL ULTRASOUND

## 2015-08-02 NOTE — ED Provider Notes (Signed)
CSN: [id number]     Arrival date & time 07/30/15  1214 History   First MD Initiated Contact with Patient 07/30/15 1259     Chief Complaint  Patient presents with  . Leg Pain     (Consider location/radiation/quality/duration/timing/severity/associated sxs/prior Treatment) HPI   Cassandra Jordan is a 65 y.o. female who presents to the Emergency Department complaining of left hip and leg pain for several days.  She describes a sharp, throbbing pain from her left lower back, hip and back side of the leg that stops mid thigh.  Pain is worse with certain movements and improves at rest.  States gabapentin that has not provided pain relief.  She denies fever, chills, injury, urine or bowel changes, abdominal pain, numbness or weakness.   Past Medical History  Diagnosis Date  . Type 2 diabetes mellitus ([place])   . Essential hypertension   . Asthma   . Neuropathic pain   . Hepatitis C   . Renal carcinoma ([place])   . Hyperlipidemia   . Arthritis   . Rash   . GERD (gastroesophageal reflux disease)   . PSVT (paroxysmal supraventricular tachycardia) ([place])   . Vaginal itching 10/01/2014  . Vaginal irritation 10/01/2014  . Yeast infection of the vagina 10/01/2014  . Lichen sclerosus et atrophicus 10/13/2014   Past Surgical History  Procedure Laterality Date  . Abdominal hysterectomy    . Wart removal    . Colonoscopy N/A 07/08/2013    RMR: Multiple colonic polyps treated [name] as described above. Inadequate prepartation comprimised examination there colonic diverticulosis. tubular and tubulovillous adenomas. next TCS 12/2013  . Esophagogastroduodenoscopy N/A 07/08/2013    RMR: Schatzki's ring ; small hiatal hernia-status post passage of a Maloney dilator  . Savory dilation N/A 07/08/2013    Procedure: SAVORY DILATION;  Surgeon: [name], MD;  [facility];  Service: Endoscopy;  Laterality: N/A;  [name] dilation N/A 07/08/2013    Procedure: [name] DILATION;  Surgeon: [name], MD;  Location: [facility];  Service: Endoscopy;  Laterality: N/A;  . Tonsillectomy    . Robot assisted laparoscopic nephrectomy Right 10/14/2013    Procedure: ROBOTIC ASSISTED LAPAROSCOPIC RADICAL NEPHRECTOMY;  Surgeon: [name], MD;  Location: [facility];  Service: Urology;  Laterality: Right;  . Colonoscopy N/A 02/09/2015    RMR: Colonic polyp removed as described above. Tubular adenoma. Next colonoscopy January 2022.   Family History  Problem Relation Age of Onset  . Colon cancer Sister 68  . Cancer Mother   . Heart attack Father   . Hypertension Daughter   . Cancer Maternal Grandfather   . Diabetes Paternal Grandmother    Social History  Substance Use Topics  . Smoking status: Current Every Day Smoker -- 0.10 packs/day for 40 years    Types: Cigarettes  . Smokeless tobacco: Never Used  . Alcohol Use: 0.0 oz/week    0 Standard drinks or equivalent per week     Comment: beer once or twice per month, 24-40 ounce.    OB History    Gravida Para Term Preterm AB TAB SAB Ectopic Multiple Living   1 1        1      Review of Systems  Constitutional: Negative for fever.  Respiratory: Negative for shortness of breath.   Gastrointestinal: Negative for vomiting, abdominal pain and constipation.  Genitourinary: Negative for dysuria, hematuria, flank pain, decreased urine volume and difficulty urinating.  Musculoskeletal: Positive for back pain. Negative  for joint swelling.  Skin: Negative for rash.  Neurological: Negative for weakness and numbness.  All other systems reviewed and are negative.     Allergies  Sulfa antibiotics  Home Medications   Prior to Admission medications   Medication Sig Start Date End Date Taking? Authorizing Provider  gabapentin (NEURONTIN) 300 MG capsule Take 1 capsule (300 mg total) by mouth 2 (two) times daily. 11/06/14   Francine Graven, DO  HYDROcodone-acetaminophen (NORCO/VICODIN) 5-325 MG tablet Take one tab po q 4-6 hrs prn pain  07/30/15   Herberth Deharo, PA-C  insulin NPH Human (HUMULIN N,NOVOLIN N) 100 UNIT/ML injection Inject 3 Units into the skin 3 (three) times daily before meals.     Historical Provider, MD  lisinopril-hydrochlorothiazide (PRINZIDE,ZESTORETIC) 20-12.5 MG per tablet Take 1 tablet by mouth every morning.    Historical Provider, MD  methocarbamol (ROBAXIN) 500 MG tablet Take 1 tablet (500 mg total) by mouth 3 (three) times daily. 07/30/15   Tiwatope Emmitt, PA-C  metoprolol tartrate (LOPRESSOR) 25 MG tablet TAKE ONE TABLET BY MOUTH TWICE DAILY 05/10/15   Imogene Burn, PA-C  naproxen (NAPROSYN) 500 MG tablet Take 1 tablet (500 mg total) by mouth 2 (two) times daily with a meal. 07/30/15   Broadus Costilla, PA-C  PACERONE 100 MG tablet Take 100 mg by mouth daily.  11/05/14   Historical Provider, MD   BP 139/82 mmHg  Pulse 83  Temp(Src) 98.4 F (36.9 C) (Oral)  Resp 16  Ht 5' (1.524 m)  Wt 74.844 kg  BMI 32.22 kg/m2  SpO2 98% Physical Exam  Constitutional: She is oriented to person, place, and time. She appears well-developed and well-nourished. No distress.  HENT:  Head: Normocephalic and atraumatic.  Neck: Normal range of motion. Neck supple.  Cardiovascular: Normal rate, regular rhythm, normal heart sounds and intact distal pulses.   No murmur heard. Pulmonary/Chest: Effort normal and breath sounds normal. No respiratory distress.  Abdominal: Soft. She exhibits no distension. There is no tenderness.  Musculoskeletal: She exhibits tenderness. She exhibits no edema.       Lumbar back: She exhibits tenderness and pain. She exhibits normal range of motion, no swelling, no deformity, no laceration and normal pulse.  ttp of the left lumbar paraspinal muscles and SI joint.  No spinal tenderness.  DP pulses are brisk and symmetrical.  Distal sensation intact.  Pt has 5/5 strength against resistance of bilateral lower extremities.     Neurological: She is alert and oriented to person, place, and time. She  has normal strength. No sensory deficit. She exhibits normal muscle tone. Coordination and gait normal.  Reflex Scores:      Patellar reflexes are 2+ on the right side and 2+ on the left side.      Achilles reflexes are 2+ on the right side and 2+ on the left side. Skin: Skin is warm and dry. No rash noted.  Nursing note and vitals reviewed.   ED Course  Procedures (including critical care time) Labs Review Labs Reviewed - No data to display  Imaging Review No results found. I have personally reviewed and evaluated these images and lab results as part of my medical decision-making.   EKG Interpretation None      MDM   Final diagnoses:  Sciatica of left side   Pt is well appearing, non-toxic.  No focal neuro deficits.  Ambulated in the dept with a steady gait.  No concerning sx's for emergent neurological process.  Pt stable for  d/c, agrees to PMD f/u if needed.     Kem Parkinson, PA-C 08/02/15 2242  Nat Christen, MD 08/03/15 [phone]

## 2015-08-03 ENCOUNTER — Ambulatory Visit (HOSPITAL_COMMUNITY)
Admission: RE | Admit: 2015-08-03 | Discharge: 2015-08-03 | Disposition: A | Discharge status: Home / Self Care | Payer: Medicare Other | Source: Ambulatory Visit | Attending: Gastroenterology | Admitting: Gastroenterology

## 2015-08-03 DIAGNOSIS — Z905 Acquired absence of kidney: Secondary | ICD-10-CM | POA: Insufficient documentation

## 2015-08-03 DIAGNOSIS — B182 Chronic viral hepatitis C: Secondary | ICD-10-CM | POA: Insufficient documentation

## 2015-08-03 DIAGNOSIS — K769 Liver disease, unspecified: Secondary | ICD-10-CM | POA: Insufficient documentation

## 2015-08-03 DIAGNOSIS — K8020 Calculus of gallbladder without cholecystitis without obstruction: Secondary | ICD-10-CM | POA: Insufficient documentation

## 2015-08-04 ENCOUNTER — Ambulatory Visit (HOSPITAL_COMMUNITY): Admission: RE | Admit: 2015-08-04 | Payer: Medicare Other | Source: Ambulatory Visit

## 2015-08-15 NOTE — Progress Notes (Signed)
Stable u/s as far as liver and gallstones. Slightly smaller lesion near pancreatic head and body seen on prior u/s. Previous MRI earlier this year and prior CT without pancreatic abnormality.   Patient should have upcoming appointment, she should keep this. She needs additional abd u/s in 6 months (pancreatic lesion and cirrhosis).

## 2015-08-16 ENCOUNTER — Ambulatory Visit: Payer: Medicare Other | Admitting: Gastroenterology

## 2015-08-16 ENCOUNTER — Encounter: Payer: Self-pay | Admitting: Gastroenterology

## 2015-08-16 ENCOUNTER — Telehealth: Payer: Self-pay | Admitting: Gastroenterology

## 2015-08-16 NOTE — Telephone Encounter (Signed)
PATIENT WAS A NO SHOW AND LETTER SENT  °

## 2015-08-17 NOTE — Progress Notes (Signed)
ON RECALL  °

## 2015-08-26 ENCOUNTER — Other Ambulatory Visit: Payer: Self-pay | Admitting: Cardiology

## 2015-09-20 ENCOUNTER — Encounter: Payer: Self-pay | Admitting: Gastroenterology

## 2015-09-20 ENCOUNTER — Telehealth: Payer: Self-pay | Admitting: Gastroenterology

## 2015-09-20 ENCOUNTER — Ambulatory Visit: Payer: Medicare Other | Admitting: Gastroenterology

## 2015-09-20 NOTE — Telephone Encounter (Signed)
PATIENT WAS A NO SHOW AND LETTER SENT  °

## 2015-10-11 ENCOUNTER — Other Ambulatory Visit: Payer: Self-pay | Admitting: Cardiology

## 2015-10-20 ENCOUNTER — Encounter (HOSPITAL_COMMUNITY): Payer: Self-pay | Admitting: Emergency Medicine

## 2015-10-20 ENCOUNTER — Emergency Department (HOSPITAL_COMMUNITY)
Admission: EM | Admit: 2015-10-20 | Discharge: 2015-10-20 | Disposition: A | Discharge status: Home / Self Care | Payer: Medicare Other | Attending: Emergency Medicine | Admitting: Emergency Medicine

## 2015-10-20 ENCOUNTER — Emergency Department (HOSPITAL_COMMUNITY): Payer: Medicare Other

## 2015-10-20 DIAGNOSIS — R079 Chest pain, unspecified: Secondary | ICD-10-CM | POA: Diagnosis not present

## 2015-10-20 DIAGNOSIS — R059 Cough, unspecified: Secondary | ICD-10-CM

## 2015-10-20 DIAGNOSIS — I10 Essential (primary) hypertension: Secondary | ICD-10-CM | POA: Diagnosis not present

## 2015-10-20 DIAGNOSIS — R05 Cough: Secondary | ICD-10-CM | POA: Diagnosis not present

## 2015-10-20 DIAGNOSIS — F17210 Nicotine dependence, cigarettes, uncomplicated: Secondary | ICD-10-CM | POA: Insufficient documentation

## 2015-10-20 DIAGNOSIS — E119 Type 2 diabetes mellitus without complications: Secondary | ICD-10-CM | POA: Diagnosis not present

## 2015-10-20 DIAGNOSIS — Z794 Long term (current) use of insulin: Secondary | ICD-10-CM | POA: Insufficient documentation

## 2015-10-20 DIAGNOSIS — R509 Fever, unspecified: Secondary | ICD-10-CM | POA: Diagnosis not present

## 2015-10-20 DIAGNOSIS — J45909 Unspecified asthma, uncomplicated: Secondary | ICD-10-CM | POA: Insufficient documentation

## 2015-10-20 DIAGNOSIS — Z79899 Other long term (current) drug therapy: Secondary | ICD-10-CM | POA: Insufficient documentation

## 2015-10-20 LAB — BASIC METABOLIC PANEL
Anion gap: 10 (ref 5–15)
BUN: 39 mg/dL — AB (ref 6–20)
CALCIUM: 9.3 mg/dL (ref 8.9–10.3)
CO2: 30 mmol/L (ref 22–32)
CREATININE: 1.88 mg/dL — AB (ref 0.44–1.00)
Chloride: 94 mmol/L — ABNORMAL LOW (ref 101–111)
GFR calc non Af Amer: 27 mL/min — ABNORMAL LOW (ref >60)
GFR, EST AFRICAN AMERICAN: 31 mL/min — AB (ref >60)
Glucose, Bld: 114 mg/dL — ABNORMAL HIGH (ref 65–99)
Potassium: 2.8 mmol/L — ABNORMAL LOW (ref 3.5–5.1)
SODIUM: 134 mmol/L — AB (ref 135–145)

## 2015-10-20 LAB — CBC WITH DIFFERENTIAL/PLATELET
Basophils Absolute: 0.1 10*3/uL (ref 0.0–0.1)
Basophils Relative: 0 %
EOS ABS: 0.2 10*3/uL (ref 0.0–0.7)
EOS PCT: 1 %
HEMATOCRIT: 42.9 % (ref 36.0–46.0)
HEMOGLOBIN: 14.5 g/dL (ref 12.0–15.0)
LYMPHS ABS: 7.2 10*3/uL — AB (ref 0.7–4.0)
LYMPHS PCT: 46 %
MCH: 30.3 pg (ref 26.0–34.0)
MCHC: 33.8 g/dL (ref 30.0–36.0)
MCV: 89.6 fL (ref 78.0–100.0)
MONO ABS: 2.2 10*3/uL — AB (ref 0.1–1.0)
Monocytes Relative: 14 %
Neutro Abs: 6.2 10*3/uL (ref 1.7–7.7)
Neutrophils Relative %: 39 %
Platelets: 223 10*3/uL (ref 150–400)
RBC: 4.79 MIL/uL (ref 3.87–5.11)
RDW: 12.4 % (ref 11.5–15.5)
WBC: 15.8 10*3/uL — AB (ref 4.0–10.5)

## 2015-10-20 LAB — TROPONIN I: Troponin I: <0.03 ng/mL (ref <0.03)

## 2015-10-20 MED ORDER — SODIUM CHLORIDE 0.9 % IV BOLUS (SEPSIS)
2000.0000 mL | Freq: Once | INTRAVENOUS | Status: AC
  Administered 2015-10-20: 2000 mL via INTRAVENOUS

## 2015-10-20 MED ORDER — POTASSIUM CHLORIDE CRYS ER 20 MEQ PO TBCR
40.0000 meq | EXTENDED_RELEASE_TABLET | Freq: Once | ORAL | Status: AC
  Administered 2015-10-20: 40 meq via ORAL
  Filled 2015-10-20: qty 2

## 2015-10-20 MED ORDER — SODIUM CHLORIDE 0.9 % IV BOLUS (SEPSIS)
500.0000 mL | Freq: Once | INTRAVENOUS | Status: AC
  Administered 2015-10-20: 1000 mL via INTRAVENOUS

## 2015-10-20 MED ORDER — ACETAMINOPHEN 325 MG PO TABS
650.0000 mg | ORAL_TABLET | Freq: Once | ORAL | Status: AC
  Administered 2015-10-20: 650 mg via ORAL
  Filled 2015-10-20: qty 2

## 2015-10-20 NOTE — ED Notes (Signed)
Pt and family updated on plan of care, pt given peanut butter and crackers per request,

## 2015-10-20 NOTE — ED Notes (Signed)
Pt admits to using cocaine and marjuania to help control her pain symptoms,

## 2015-10-20 NOTE — ED Provider Notes (Signed)
[facility] DEPT Provider Note   CSN: [id number] Arrival date & time: 10/20/15  1827     History   Chief Complaint Chief Complaint  Patient presents with  . Cough    HPI Cassandra Jordan is a 65 y.o. female.  She presents for evaluation of right-sided chest pain associated with productive cough of green to yellow sputum. She denies fever, chills, nausea, vomiting, weakness or dizziness. She states she is taking her usual medications. She does not check her blood sugar at home. She has not seen her PCP recently. She denies headache, neck pain or back pain. She is here with family members. There are no other known modifying factors.  HPI  Past Medical History:  Diagnosis Date  . Arthritis   . Asthma   . Essential hypertension   . GERD (gastroesophageal reflux disease)   . Hepatitis C   . Hyperlipidemia   . Lichen sclerosus et atrophicus 10/13/2014  . Neuropathic pain   . PSVT (paroxysmal supraventricular tachycardia) ([place])   . Rash   . Renal carcinoma ([place])   . Type 2 diabetes mellitus ([place])   . Vaginal irritation 10/01/2014  . Vaginal itching 10/01/2014  . Yeast infection of the vagina 10/01/2014    Patient Active Problem List   Diagnosis Date Noted  . Hepatic cirrhosis ([place]) 04/08/2015  . History of adenomatous polyp of colon   . History of colonic polyps   . Hx of adenomatous colonic polyps 12/10/2014  . Lichen sclerosus et atrophicus 10/13/2014  . Vaginal itching 10/01/2014  . Vaginal irritation 10/01/2014  . Yeast infection of the vagina 10/01/2014  . Blurred vision, right eye 03/20/2014  . UTI (lower urinary tract infection) 03/20/2014  . Cocaine abuse 03/20/2014  . Generalized weakness 03/20/2014  . Chest pain 02/10/2014  . Chest pain at rest 02/10/2014  . Type 2 diabetes mellitus ([place])   . Diabetes mellitus, type II ([place])   . Tobacco abuse   . PSVT (paroxysmal supraventricular tachycardia) ([place]) 11/04/2013  . Essential hypertension 10/16/2013  . Renal  mass 10/14/2013  . Ovarian mass, left 08/17/2013  . Hepatitis C, chronic ([place]) 07/01/2013  . Elevated LFTs 07/01/2013  . Other dysphagia 07/01/2013  . Encounter for screening colonoscopy 07/01/2013    Past Surgical History:  Procedure Laterality Date  . ABDOMINAL HYSTERECTOMY    . COLONOSCOPY N/A 07/08/2013   RMR: Multiple colonic polyps treated [name] as described above. Inadequate prepartation comprimised examination there colonic diverticulosis. tubular and tubulovillous adenomas. next TCS 12/2013  . COLONOSCOPY N/A 02/09/2015   RMR: Colonic polyp removed as described above. Tubular adenoma. Next colonoscopy January 2022.  . ESOPHAGOGASTRODUODENOSCOPY N/A 07/08/2013   RMR: Schatzki's ring ; small hiatal hernia-status post passage of a Maloney dilator  . MALONEY DILATION N/A 07/08/2013   Procedure: [name] DILATION;  Surgeon: [name], MD;  [facility];  Service: Endoscopy;  Laterality: N/A;  . ROBOT ASSISTED LAPAROSCOPIC NEPHRECTOMY Right 10/14/2013   Procedure: ROBOTIC ASSISTED LAPAROSCOPIC RADICAL NEPHRECTOMY;  Surgeon: [name], MD;  Location: [facility];  Service: Urology;  Laterality: Right;  . SAVORY DILATION N/A 07/08/2013   Procedure: SAVORY DILATION;  Surgeon: [name], MD;  [facility];  Service: Endoscopy;  Laterality: N/A;  . TONSILLECTOMY    . Wart removal      OB History    Gravida Para Term Preterm AB Living   1 1       1    SAB TAB Ectopic Multiple  Live Births                   Home Medications    Prior to Admission medications   Medication Sig Start Date End Date Taking? Authorizing Provider  gabapentin (NEURONTIN) 300 MG capsule Take 1 capsule (300 mg total) by mouth 2 (two) times daily. 11/06/14  Yes Francine Graven, DO  HYDROcodone-acetaminophen (NORCO/VICODIN) 5-325 MG tablet Take one tab po q 4-6 hrs prn pain 07/30/15  Yes Tammy Triplett, PA-C  insulin NPH Human (HUMULIN N,NOVOLIN N) 100 UNIT/ML injection Inject 3  Units into the skin 3 (three) times daily before meals.    Yes Historical Provider, MD  lisinopril-hydrochlorothiazide (PRINZIDE,ZESTORETIC) 20-12.5 MG per tablet Take 1 tablet by mouth every morning.   Yes Historical Provider, MD  metoprolol tartrate (LOPRESSOR) 25 MG tablet TAKE ONE TABLET BY MOUTH TWICE DAILY 05/10/15  Yes Imogene Burn, PA-C  PACERONE 100 MG tablet TAKE ONE TABLET BY MOUTH ONCE DAILY --  **NEEDS  APPOINTMENT  FOR  FURTHER  REFILLS** 10/11/15  Yes Satira Sark, MD    Family History Family History  Problem Relation Age of Onset  . Colon cancer Sister 65  . Cancer Mother   . Heart attack Father   . Hypertension Daughter   . Cancer Maternal Grandfather   . Diabetes Paternal Grandmother     Social History Social History  Substance Use Topics  . Smoking status: Current Every Day Smoker    Packs/day: 0.10    Years: 40.00    Types: Cigarettes  . Smokeless tobacco: Never Used  . Alcohol use 0.0 oz/week     Comment: beer once or twice per month, 24-40 ounce.      Allergies   Sulfa antibiotics   Review of Systems Review of Systems  All other systems reviewed and are negative.    Physical Exam Updated Vital Signs BP 185/99   Pulse 86   Temp 97.9 F (36.6 C) (Oral)   Resp 20   Ht 5' (1.524 m)   SpO2 (!) 89%   Physical Exam  Constitutional: She is oriented to person, place, and time. She appears well-developed. No distress.  Appears older than stated age.  HENT:  Head: Normocephalic and atraumatic.  Eyes: Conjunctivae and EOM are normal. Pupils are equal, round, and reactive to light.  Neck: Normal range of motion and phonation normal. Neck supple.  Cardiovascular: Normal rate and regular rhythm.   Pulmonary/Chest: Effort normal. She exhibits no tenderness.  Somewhat decreased over the right base, with scattered rhonchi, no wheezing.  Abdominal: Soft. She exhibits no distension. There is no tenderness. There is no guarding.  Musculoskeletal:  Normal range of motion.  Neurological: She is alert and oriented to person, place, and time. She exhibits normal muscle tone.  Skin: Skin is warm and dry.  Psychiatric: She has a normal mood and affect. Her behavior is normal. Judgment and thought content normal.  Nursing note and vitals reviewed.    ED Treatments / Results  Labs (all labs ordered are listed, but only abnormal results are displayed) Labs Reviewed  BASIC METABOLIC PANEL - Abnormal; Notable for the following:       Result Value   Sodium 134 (*)    Potassium 2.8 (*)    Chloride 94 (*)    Glucose, Bld 114 (*)    BUN 39 (*)    Creatinine, Ser 1.88 (*)    GFR calc non Af Amer 27 (*)  GFR calc Af Amer 31 (*)    All other components within normal limits  CBC WITH DIFFERENTIAL/PLATELET - Abnormal; Notable for the following:    WBC 15.8 (*)    Lymphs Abs 7.2 (*)    Monocytes Absolute 2.2 (*)    All other components within normal limits  TROPONIN I    EKG  EKG Interpretation None       Radiology Dg Chest 2 View  Result Date: 10/20/2015 CLINICAL DATA:  66 year old with three-day history of low-grade fever, productive cough, low back pain, bilateral lower extremity pain and diarrhea. Current history of asthma. Personal history of right nephrectomy for renal cell cancer. EXAM: CHEST  2 VIEW COMPARISON:  01/03/2015, 11/11/2014 and earlier. FINDINGS: Lateral image blurred by respiratory motion. Cardiac silhouette normal in size, unchanged. Thoracic aorta mildly atherosclerotic, unchanged. Hilar and mediastinal contours otherwise unremarkable. Minimal linear scarring in the lingula. Lungs otherwise clear. Pulmonary vascularity normal. No pleural effusions. Slight thoracic dextroscoliosis is noted previously. IMPRESSION: 1.  No acute cardiopulmonary disease. 2. Thoracic aortic atherosclerosis. Electronically Signed   By: Evangeline Dakin M.D.   On: 10/20/2015 19:35    Procedures Procedures (including critical care  time)  Medications Ordered in ED Medications  acetaminophen (TYLENOL) tablet 650 mg (not administered)  sodium chloride 0.9 % bolus 500 mL (0 mLs Intravenous Stopped 10/20/15 2124)  potassium chloride SA (K-DUR,KLOR-CON) CR tablet 40 mEq (40 mEq Oral Given 10/20/15 2101)  sodium chloride 0.9 % bolus 2,000 mL (2,000 mLs Intravenous New Bag/Given 10/20/15 2103)     Initial Impression / Assessment and Plan / ED Course  I have reviewed the triage vital signs and the nursing notes.  Pertinent labs & imaging results that were available during my care of the patient were reviewed by me and considered in my medical decision making (see chart for details).  Clinical Course    Medications  acetaminophen (TYLENOL) tablet 650 mg (not administered)  sodium chloride 0.9 % bolus 500 mL (0 mLs Intravenous Stopped 10/20/15 2124)  potassium chloride SA (K-DUR,KLOR-CON) CR tablet 40 mEq (40 mEq Oral Given 10/20/15 2101)  sodium chloride 0.9 % bolus 2,000 mL (2,000 mLs Intravenous New Bag/Given 10/20/15 2103)    Patient Vitals for the past 24 hrs:  BP Temp Temp src Pulse Resp SpO2 Height  10/20/15 2130 185/99 - - 86 - (!) 89 % -  10/20/15 2106 198/86 97.9 F (36.6 C) Oral 78 20 95 % -  10/20/15 1841 120/71 98.9 F (37.2 C) Oral 78 16 95 % -  10/20/15 1836 - - - - - - 5' (1.524 m)    9:45 PM Reevaluation with update and discussion. After initial assessment and treatment, an updated evaluation reveals He complains of "sore throat". She denies chest pain, this time. She states that she wants to stay in the hospital overnight, and I explained that there was no indication for that. I answered additional questions for the patient's family member.Daleen Bo L    Final Clinical Impressions(s) / ED Diagnoses   Final diagnoses:  Cough  Chest pain, unspecified chest pain type   Nonspecific cough and chest pain. Doubt ACS, PE or pneumonia. She did not broach the subject of cocaine abuse with me. There is  no indication for hospitalization for cocaine abuse.  Nursing Notes Reviewed/ Care Coordinated Applicable Imaging Reviewed Interpretation of Laboratory Data incorporated into ED treatment  The patient appears reasonably screened and/or stabilized for discharge and I doubt any other medical condition or  other [place] requiring further screening, evaluation, or treatment in the ED at this time prior to discharge.  Plan: Home Medications- continue; Home Treatments- rest; return here if the recommended treatment, does not improve the symptoms; Recommended follow up- PCP 3-4 days    New Prescriptions New Prescriptions   No medications on file     Daleen Bo, MD 10/20/15 2146

## 2015-10-20 NOTE — ED Triage Notes (Signed)
Pt c/o productive cough with greenish colored thick sputum. Pt has low grade fever today per EMS (99.7) CBG 155 en route. Pt NSR on monitor. Pt also c/o low back pain and bilat leg pain. Hx of neuropathy. Pt reports diarrhea x 3 days ago.

## 2015-10-20 NOTE — ED Triage Notes (Signed)
Pt requesting to be sent to rehab. Using cocaine.

## 2015-10-20 NOTE — ED Notes (Signed)
Patient transported to X-ray 

## 2015-10-24 LAB — PATHOLOGIST SMEAR REVIEW

## 2015-10-25 ENCOUNTER — Ambulatory Visit (INDEPENDENT_AMBULATORY_CARE_PROVIDER_SITE_OTHER): Payer: Medicare Other | Admitting: Internal Medicine

## 2015-10-25 VITALS — BP 152/84 | HR 62 | Temp 97.4°F | Ht 60.0 in | Wt 138.2 lb

## 2015-10-25 DIAGNOSIS — B1710 Acute hepatitis C without hepatic coma: Secondary | ICD-10-CM

## 2015-10-25 DIAGNOSIS — K7030 Alcoholic cirrhosis of liver without ascites: Secondary | ICD-10-CM

## 2015-10-25 DIAGNOSIS — F1910 Other psychoactive substance abuse, uncomplicated: Secondary | ICD-10-CM | POA: Diagnosis not present

## 2015-10-25 NOTE — Progress Notes (Signed)
Primary Care Physician:  Wenda Low, MD Primary Gastroenterologist:  Dr. Gala Romney  Pre-Procedure History & Physical: HPI:  Cassandra Jordan is a 65 y.o. female here for EtOH/HCV cirrhosis. Finished hepatitis B vaccination series per report. 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 Not interested hepatitis C treatment as of yet. Wants "the Reita Cliche and her family to help her up stay away from it".  Declines third-party help. She will be due for a screening EGD next year.Surveillance colonoscopy (polyp)  2022.  Past Medical History:  Diagnosis Date  . Arthritis   . Asthma   . Essential hypertension   . GERD (gastroesophageal reflux disease)   . Hepatitis C   . Hyperlipidemia   . Lichen sclerosus et atrophicus 10/13/2014  . Neuropathic pain   . PSVT (paroxysmal supraventricular tachycardia) ([place])   . Rash   . Renal carcinoma ([place])   . Type 2 diabetes mellitus ([place])   . Vaginal irritation 10/01/2014  . Vaginal itching 10/01/2014  . Yeast infection of the vagina 10/01/2014    Past Surgical History:  Procedure Laterality Date  . ABDOMINAL HYSTERECTOMY    . COLONOSCOPY N/A 07/08/2013   RMR: Multiple colonic polyps treated [name] as described above. Inadequate prepartation comprimised examination there colonic diverticulosis. tubular and tubulovillous adenomas. next TCS 12/2013  . COLONOSCOPY N/A 02/09/2015   RMR: Colonic polyp removed as described above. Tubular adenoma. Next colonoscopy January 2022.  . ESOPHAGOGASTRODUODENOSCOPY N/A 07/08/2013   RMR: Schatzki's ring ; small hiatal hernia-status post passage of a Maloney dilator  . MALONEY DILATION N/A 07/08/2013   Procedure: [name] DILATION;  Surgeon: [name], MD;  [facility];  Service: Endoscopy;  Laterality: N/A;  . ROBOT ASSISTED LAPAROSCOPIC NEPHRECTOMY Right 10/14/2013   Procedure: ROBOTIC ASSISTED LAPAROSCOPIC RADICAL NEPHRECTOMY;  Surgeon: [name], MD;  [facility]: [facility];   Service: Urology;  Laterality: Right;  . SAVORY DILATION N/A 07/08/2013   Procedure: SAVORY DILATION;  Surgeon: [name], MD;  [facility];  Service: Endoscopy;  Laterality: N/A;  . TONSILLECTOMY    . Wart removal      Prior to Admission medications   Medication Sig Start Date End Date Taking? Authorizing Provider  gabapentin (NEURONTIN) 300 MG capsule Take 1 capsule (300 mg total) by mouth 2 (two) times daily. 11/06/14  Yes Francine Graven, DO  insulin NPH Human (HUMULIN N,NOVOLIN N) 100 UNIT/ML injection Inject 3 Units into the skin 3 (three) times daily before meals.    Yes Historical Provider, MD  lisinopril-hydrochlorothiazide (PRINZIDE,ZESTORETIC) 20-12.5 MG per tablet Take 1 tablet by mouth every morning.   Yes Historical Provider, MD  metoprolol tartrate (LOPRESSOR) 25 MG tablet TAKE ONE TABLET BY MOUTH TWICE DAILY 05/10/15  Yes Imogene Burn, PA-C  HYDROcodone-acetaminophen (NORCO/VICODIN) 5-325 MG tablet Take one tab po q 4-6 hrs prn pain Patient not taking: Reported on 10/25/2015 07/30/15   Tammy Triplett, PA-C  PACERONE 100 MG tablet TAKE ONE TABLET BY MOUTH ONCE DAILY --  **NEEDS  APPOINTMENT  FOR  FURTHER  REFILLS** Patient not taking: Reported on 10/25/2015 10/11/15   Satira Sark, MD    Allergies as of 10/25/2015 - Review Complete 10/25/2015  Allergen Reaction Noted  . Sulfa antibiotics Itching and Other (See Comments) 04/10/2012    Family History  Problem Relation Age of Onset  . Colon cancer Sister 85  . Cancer Mother   . Heart attack Father   . Hypertension Daughter   .  Cancer Maternal Grandfather   . Diabetes Paternal Grandmother     Social History   Social History  . Marital status: Single    Spouse name: N/A  . Number of children: N/A  . Years of education: N/A   Occupational History  . Not on file.   Social History Main Topics  . Smoking status: Current Every Day Smoker    Packs/day: 0.10    Years: 40.00    Types: Cigarettes    . Smokeless tobacco: Never Used  . Alcohol use 0.0 oz/week     Comment: beer once or twice per month, 24-40 ounce.   . Drug use:     Types: Marijuana, Cocaine     Comment: today, cocaine  . Sexual activity: Not Currently    Birth control/ protection: Surgical     Comment: hyst   Other Topics Concern  . Not on file   Social History Narrative  . No narrative on file    Review of Systems: See HPI, otherwise negative ROS  Physical Exam: BP (!) 152/84   Pulse 62   Temp 97.4 F (36.3 C) (Oral)   Ht 5' (1.524 m)   Wt 138 lb 3.2 oz (62.7 kg)   BMI 26.99 kg/m  General:    pleasant and cooperative in NAD.  Slightly disheveled Eyes:  Sclera clear, no icterus.   Conjunctiva pink. Ears:  Normal auditory acuity. Nose:  No deformity, discharge,  or lesions. Mouth:  No deformity or lesions. Neck:  Supple; no masses or thyromegaly. No significant cervical adenopathy. Lungs:  Clear throughout to auscultation.   No wheezes, crackles, or rhonchi. No acute distress. Heart:  Regular rate and rhythm; no murmurs, clicks, rubs,  or gallops. Abdomen: Non-distended, normal bowel sounds.  Soft and nontender without appreciable mass or hepatosplenomegaly.  Pulses:  Normal pulses noted. Extremities:  Without clubbing or edema.  Impression:  EtOH/HCV cirrhosis-remains well compensated. Not yet treated for hepatitis C. Offered third-party help. Patient declines. It sounds like she completed her vaccination Against hepatitis B. MRI reassuring as far as the pancreatic head mass. Follow-up ultrasound recommended.  History of colonic adenoma  Recommendations:  Abstain from alcohol and illicit drugs  Pancreas and liver ultrasound January 2018 - f/u on prior abnormalities  Office visit in February 2018 to set up a screening EGD (propofol)  Surveillance colonoscopy 2022.  Hopefully, will get hepatitis C eradicated next year.    Notice: This dictation was prepared with Dragon dictation along  with smaller phrase technology. Any transcriptional errors that result from this process are unintentional and may not be corrected upon review.

## 2015-10-25 NOTE — Patient Instructions (Signed)
Abstain from alcohol and illicit drugs  Pancreas and liver ultrasound January 2018 - f/u on prior abnormalities  Office visit in February 2018 to set up a screening EGD (propofol)  Hopefully, will treat your hepatitis C next year

## 2015-11-15 ENCOUNTER — Other Ambulatory Visit: Payer: Self-pay | Admitting: *Deleted

## 2015-11-15 ENCOUNTER — Other Ambulatory Visit: Payer: Self-pay | Admitting: Cardiology

## 2015-11-15 MED ORDER — METOPROLOL TARTRATE 25 MG PO TABS: 25.0000 mg | ORAL_TABLET | Freq: Two times a day (BID) | ORAL | 0 refills | Status: DC

## 2015-11-15 NOTE — Progress Notes (Signed)
Cardiology Office Note  Date: 11/16/2015   ID: Cassandra Jordan, DOB 13-Apr-1950, MRN [id number]  PCP: Wenda Low, MD  Primary Cardiologist: Rozann Lesches, MD   Chief Complaint  Patient presents with  . PSVT    History of Present Illness: Cassandra Jordan is a 64 y.o. female last seen in May 2016. She presents overdue for a follow-up visit. Denies having any significant chest pain or palpitations in the interim. She states that she is in the process of changing primary care providers.  I reviewed her medications which are outlined below. She reports compliance with amiodarone, Lopressor, and Prinzide. She is due for follow-up lab work on amiodarone. Recent chest x-ray in September revealed no acute cardiopulmonary process.  She also tells me that she is trying to quit smoking, and continues to have relapses with drug use.  I reviewed her ECG today which shows sinus rhythm with decreased R wave progression and normal QT interval.  Past Medical History:  Diagnosis Date  . Arthritis   . Asthma   . Essential hypertension   . GERD (gastroesophageal reflux disease)   . Hepatitis C   . Hyperlipidemia   . Lichen sclerosus et atrophicus 10/13/2014  . Neuropathic pain   . PSVT (paroxysmal supraventricular tachycardia) ([place])   . Rash   . Renal carcinoma ([place])   . Type 2 diabetes mellitus ([place])   . Vaginal irritation 10/01/2014  . Vaginal itching 10/01/2014  . Yeast infection of the vagina 10/01/2014    Past Surgical History:  Procedure Laterality Date  . ABDOMINAL HYSTERECTOMY    . COLONOSCOPY N/A 07/08/2013   RMR: Multiple colonic polyps treated [name] as described above. Inadequate prepartation comprimised examination there colonic diverticulosis. tubular and tubulovillous adenomas. next TCS 12/2013  . COLONOSCOPY N/A 02/09/2015   RMR: Colonic polyp removed as described above. Tubular adenoma. Next colonoscopy January 2022.  . ESOPHAGOGASTRODUODENOSCOPY N/A 07/08/2013   RMR:  Schatzki's ring ; small hiatal hernia-status post passage of a Maloney dilator  . MALONEY DILATION N/A 07/08/2013   Procedure: [name] DILATION;  Surgeon: [name], MD;  [facility];  Service: Endoscopy;  Laterality: N/A;  . ROBOT ASSISTED LAPAROSCOPIC NEPHRECTOMY Right 10/14/2013   Procedure: ROBOTIC ASSISTED LAPAROSCOPIC RADICAL NEPHRECTOMY;  Surgeon: [name], MD;  [facility];  Service: Urology;  Laterality: Right;  . SAVORY DILATION N/A 07/08/2013   Procedure: SAVORY DILATION;  Surgeon: [name], MD;  [facility];  Service: Endoscopy;  Laterality: N/A;  . TONSILLECTOMY    . Wart removal      Current Outpatient Prescriptions  Medication Sig Dispense Refill  . gabapentin (NEURONTIN) 300 MG capsule Take 1 capsule (300 mg total) by mouth 2 (two) times daily. 20 capsule 0  . HYDROcodone-acetaminophen (NORCO/VICODIN) 5-325 MG tablet Take one tab po q 4-6 hrs prn pain 10 tablet 0  . insulin NPH Human (HUMULIN N,NOVOLIN N) 100 UNIT/ML injection Inject 3 Units into the skin 3 (three) times daily before meals.     Marland Kitchen lisinopril-hydrochlorothiazide (PRINZIDE,ZESTORETIC) 20-12.5 MG per tablet Take 1 tablet by mouth every morning.    . metoprolol tartrate (LOPRESSOR) 25 MG tablet Take 1 tablet (25 mg total) by mouth 2 (two) times daily. 60 tablet 0  . PACERONE 100 MG tablet TAKE ONE TABLET BY MOUTH ONCE DAILY --  **NEEDS  APPOINTMENT  FOR  FURTHER  REFILLS** 15 tablet 0   No current facility-administered medications for this visit.    Allergies:  Sulfa antibiotics   Social History: The patient  reports that she has been smoking Cigarettes.  She has a 4.00 pack-year smoking history. She has never used smokeless tobacco. She reports that she drinks alcohol. She reports that she uses drugs, including Marijuana and Cocaine.   ROS:  Please see the history of present illness. Otherwise, complete review of systems is positive for chronic fatigue and pain.  All  other systems are reviewed and negative.   Physical Exam: VS:  BP 130/84   Pulse 78   Ht 5' (1.524 m)   Wt 140 lb (63.5 kg)   SpO2 97%   BMI 27.34 kg/m , BMI Body mass index is 27.34 kg/m.  Wt Readings from Last 3 Encounters:  11/16/15 140 lb (63.5 kg)  10/25/15 138 lb 3.2 oz (62.7 kg)  07/30/15 165 lb (74.8 kg)    General: Patient appears comfortable at rest. HEENT: Conjunctiva and lids normal, oropharynx clear with poor dentition. Neck: Supple, no elevated JVP or carotid bruits, no thyromegaly. Lungs: Clear to auscultation, nonlabored breathing at rest. Cardiac: Regular rate and rhythm, S4, no significant systolic murmur, no pericardial rub. Abdomen: Soft, nontender, bowel sounds present. Extremities: No pitting edema, distal pulses 2+. Skin: Warm and dry. Musculoskeletal: No kyphosis. Neuropsychiatric: Alert and oriented x3, affect grossly appropriate.  ECG: I personally reviewed the tracing from 01/03/2015 which showed sinus rhythm with increased voltage and prolonged QT interval.  Recent Labwork: 01/03/2015: ALT 68; AST 74 10/20/2015: BUN 39; Creatinine, Ser 1.88; Hemoglobin 14.5; Platelets 223; Potassium 2.8; Sodium 134     Component Value Date/Time   CHOL 201 (H) 02/10/2014 1400   TRIG 117 02/10/2014 1400   HDL 41 02/10/2014 1400   CHOLHDL 4.9 02/10/2014 1400   VLDL 23 02/10/2014 1400   LDLCALC 137 (H) 02/10/2014 1400    Other Studies Reviewed Today:  Echocardiogram 10/15/2013: Study Conclusions  - Left ventricle: The cavity size was normal. There was moderate concentric hypertrophy. Systolic function was vigorous. The estimated ejection fraction was in the range of 65% to 70%. There was dynamic obstruction at restin the mid cavity, with a peak velocity of 3 cm/sec and a peak gradient of 36 mm Hg. Wall motion was normal; there were no regional wall motion abnormalities. Doppler parameters are consistent with abnormal left  ventricular relaxation (grade 1 diastolic dysfunction).  Chest x-ray 10/20/2015: FINDINGS: Lateral image blurred by respiratory motion. Cardiac silhouette normal in size, unchanged. Thoracic aorta mildly atherosclerotic, unchanged. Hilar and mediastinal contours otherwise unremarkable. Minimal linear scarring in the lingula. Lungs otherwise clear. Pulmonary vascularity normal. No pleural effusions. Slight thoracic dextroscoliosis is noted previously.  IMPRESSION: 1.  No acute cardiopulmonary disease. 2. Thoracic aortic atherosclerosis.  Assessment and Plan:  1. History of recurrent PSVT, symptomatically well-controlled on Lopressor and low-dose amiodarone. Patient has not been consistent with follow-up, we discussed this today. Recent chest x-ray did not show any acute cardiopulmonary process. We will obtain TSH and LFTs, also encouraged to get an annual exam.  2. Tobacco use and intermittent drug use, we discussed cessation today.  3. Essential hypertension, blood pressure is adequately controlled today.  Current medicines were reviewed with the patient today.   Orders Placed This Encounter  Procedures  . EKG 12-Lead    Disposition: Follow-up with me in 6 months.  Signed, Satira Sark, MD, [facility] 11/16/2015 2:11 PM    [facility] [address] [place], [place] [address] Phone: [phone]; Fax: [phone]  [phone]  

## 2015-11-16 ENCOUNTER — Ambulatory Visit (INDEPENDENT_AMBULATORY_CARE_PROVIDER_SITE_OTHER): Payer: Medicare Other | Admitting: Cardiology

## 2015-11-16 ENCOUNTER — Encounter: Payer: Self-pay | Admitting: Cardiology

## 2015-11-16 VITALS — BP 130/84 | HR 78 | Ht 60.0 in | Wt 140.0 lb

## 2015-11-16 DIAGNOSIS — Z79899 Other long term (current) drug therapy: Secondary | ICD-10-CM | POA: Diagnosis not present

## 2015-11-16 DIAGNOSIS — Z720 Tobacco use: Secondary | ICD-10-CM

## 2015-11-16 DIAGNOSIS — I471 Supraventricular tachycardia: Secondary | ICD-10-CM

## 2015-11-16 DIAGNOSIS — I10 Essential (primary) hypertension: Secondary | ICD-10-CM | POA: Diagnosis not present

## 2015-11-16 LAB — HEPATIC FUNCTION PANEL
ALBUMIN: 3 g/dL — AB (ref 3.6–5.1)
ALK PHOS: 64 U/L (ref 33–130)
ALT: 31 U/L — ABNORMAL HIGH (ref 6–29)
AST: 51 U/L — ABNORMAL HIGH (ref 10–35)
BILIRUBIN DIRECT: 0.1 mg/dL (ref <=0.2)
BILIRUBIN INDIRECT: 0.3 mg/dL (ref 0.2–1.2)
BILIRUBIN TOTAL: 0.4 mg/dL (ref 0.2–1.2)
Total Protein: 7.3 g/dL (ref 6.1–8.1)

## 2015-11-16 LAB — TSH: TSH: 1.47 mIU/L

## 2015-11-16 MED ORDER — AMIODARONE HCL 100 MG PO TABS: ORAL_TABLET | ORAL | 2 refills | Status: DC

## 2015-11-16 NOTE — Patient Instructions (Signed)
Your physician recommends that you continue on your current medications as directed. Please refer to the Current Medication list given to you today.  Your physician recommends that you return for lab work in: TODAY LFT'S  TSH   Your physician wants you to follow-up in: [place]. [place].  You will receive a reminder letter in the mail two months in advance. If you don't receive a letter, please call our office to schedule the follow-up appointment.  Thanks for choosing [facility]!!!

## 2015-11-22 DIAGNOSIS — E1142 Type 2 diabetes mellitus with diabetic polyneuropathy: Secondary | ICD-10-CM | POA: Diagnosis not present

## 2015-11-22 DIAGNOSIS — Z23 Encounter for immunization: Secondary | ICD-10-CM | POA: Diagnosis not present

## 2015-11-22 DIAGNOSIS — N182 Chronic kidney disease, stage 2 (mild): Secondary | ICD-10-CM | POA: Diagnosis not present

## 2015-11-22 DIAGNOSIS — B1920 Unspecified viral hepatitis C without hepatic coma: Secondary | ICD-10-CM | POA: Diagnosis not present

## 2015-11-22 DIAGNOSIS — I10 Essential (primary) hypertension: Secondary | ICD-10-CM | POA: Diagnosis not present

## 2015-11-29 DIAGNOSIS — C649 Malignant neoplasm of unspecified kidney, except renal pelvis: Secondary | ICD-10-CM | POA: Diagnosis not present

## 2015-11-29 DIAGNOSIS — N281 Cyst of kidney, acquired: Secondary | ICD-10-CM | POA: Diagnosis not present

## 2015-11-29 DIAGNOSIS — C641 Malignant neoplasm of right kidney, except renal pelvis: Secondary | ICD-10-CM | POA: Diagnosis not present

## 2015-12-01 DIAGNOSIS — Z85528 Personal history of other malignant neoplasm of kidney: Secondary | ICD-10-CM | POA: Diagnosis not present

## 2015-12-05 DIAGNOSIS — Z85528 Personal history of other malignant neoplasm of kidney: Secondary | ICD-10-CM | POA: Diagnosis not present

## 2015-12-06 DIAGNOSIS — F17210 Nicotine dependence, cigarettes, uncomplicated: Secondary | ICD-10-CM | POA: Diagnosis not present

## 2015-12-06 DIAGNOSIS — B1920 Unspecified viral hepatitis C without hepatic coma: Secondary | ICD-10-CM | POA: Diagnosis not present

## 2015-12-06 DIAGNOSIS — N182 Chronic kidney disease, stage 2 (mild): Secondary | ICD-10-CM | POA: Diagnosis not present

## 2015-12-06 DIAGNOSIS — E1142 Type 2 diabetes mellitus with diabetic polyneuropathy: Secondary | ICD-10-CM | POA: Diagnosis not present

## 2015-12-06 DIAGNOSIS — I10 Essential (primary) hypertension: Secondary | ICD-10-CM | POA: Diagnosis not present

## 2015-12-06 DIAGNOSIS — K029 Dental caries, unspecified: Secondary | ICD-10-CM | POA: Diagnosis not present

## 2016-01-03 ENCOUNTER — Telehealth: Payer: Self-pay | Admitting: Internal Medicine

## 2016-01-03 NOTE — Telephone Encounter (Signed)
Recall for ultrasound 

## 2016-01-03 NOTE — Telephone Encounter (Signed)
Letter mailed

## 2016-01-04 ENCOUNTER — Ambulatory Visit (INDEPENDENT_AMBULATORY_CARE_PROVIDER_SITE_OTHER): Payer: Medicare Other | Admitting: Gastroenterology

## 2016-01-04 ENCOUNTER — Encounter: Payer: Self-pay | Admitting: Gastroenterology

## 2016-01-04 VITALS — BP 170/88 | HR 81 | Temp 97.2°F | Ht 60.0 in | Wt 139.9 lb

## 2016-01-04 DIAGNOSIS — R159 Full incontinence of feces: Secondary | ICD-10-CM | POA: Diagnosis not present

## 2016-01-04 DIAGNOSIS — B182 Chronic viral hepatitis C: Secondary | ICD-10-CM | POA: Diagnosis not present

## 2016-01-04 DIAGNOSIS — K7030 Alcoholic cirrhosis of liver without ascites: Secondary | ICD-10-CM | POA: Diagnosis not present

## 2016-01-04 MED ORDER — HYDROCORTISONE 2.5 % RE CREA: 1.0000 "application " | TOPICAL_CREAM | Freq: Two times a day (BID) | RECTAL | 1 refills | Status: DC

## 2016-01-04 NOTE — Progress Notes (Signed)
CC'ED TO PCP 

## 2016-01-04 NOTE — Assessment & Plan Note (Addendum)
Genotype 1b, treatment naive. Not a candidate for treatment due to persistent cocaine use. Discussed this with patient and encouraged rehab. She will consider this at her next appt.

## 2016-01-04 NOTE — Patient Instructions (Signed)
We have scheduled you for an ultrasound of your liver.   Start taking fiber supplement daily (like Metamucil, Benefiber, etc).   Use the Anusol cream twice a day for 7 days, take a break from it, then you can use again if needed.   We will see you in February to arrange an upper endoscopy.  Call me when you are ready for referral for a treatment program.

## 2016-01-04 NOTE — Assessment & Plan Note (Signed)
65 year old with HCV/ETOH cirrhosis, compensated. Continues to drink alcohol although has cut down on intake significantly; still uses cocaine, last using yesterday. Declining treatment options currently but will discuss with her daughter when she returns in February 2018. Due for US abdomen now for [facility] screening. EGD in 2018. Will arrange at next visit. I have also told her she would need to have a clean drug screen prior to procedure.

## 2016-01-04 NOTE — Progress Notes (Signed)
Referring Provider: Wenda Low, MD Primary Care Physician:  Wenda Low, MD  Primary GI: Dr. Gala Romney   Chief Complaint  Patient presents with  . Abdominal Pain    HPI:   Cassandra Jordan is a 65 y.o. female presenting today with a history of ETOH/HCV cirrhosis, non-compliant with ETOH and drug abstinence historically. Needs Hep C treatment (genotype 1b) but not a candidate while still using illicit drugs. Due for screening EGD 2018. Due for US abdomen Jan 2018. Has one more Hep B shot left in the series.    Drinking 1 can of beer about every month or so. Uses cocaine still. Cocaine helps her pain. Has neuropathy, cocaine stops from hurting. States "let me get through my birthday in January and then I will think about rehab".   Notes thick, glue-like stool. Incontinent. Will pass a BM in her diaper and not realize it. Walking will sometimes trigger. Not able to say how many times she has a BM because "it does it right often". No diarrhea. Symptoms present for about 2-3 weeks. No rectal bleeding. Sometimes itching, rectal discomfort. Declining rectal exam.   Past Medical History:  Diagnosis Date  . Arthritis   . Asthma   . Essential hypertension   . GERD (gastroesophageal reflux disease)   . Hepatitis C   . Hyperlipidemia   . Lichen sclerosus et atrophicus 10/13/2014  . Neuropathic pain   . PSVT (paroxysmal supraventricular tachycardia) ([place])   . Rash   . Renal carcinoma ([place])   . Type 2 diabetes mellitus ([place])   . Vaginal irritation 10/01/2014  . Vaginal itching 10/01/2014  . Yeast infection of the vagina 10/01/2014    Past Surgical History:  Procedure Laterality Date  . ABDOMINAL HYSTERECTOMY    . COLONOSCOPY N/A 07/08/2013   RMR: Multiple colonic polyps treated [name] as described above. Inadequate prepartation comprimised examination there colonic diverticulosis. tubular and tubulovillous adenomas. next TCS 12/2013  . COLONOSCOPY N/A 02/09/2015   RMR: Colonic polyp  removed as described above. Tubular adenoma. Next colonoscopy January 2022.  . ESOPHAGOGASTRODUODENOSCOPY N/A 07/08/2013   RMR: Schatzki's ring ; small hiatal hernia-status post passage of a Maloney dilator  . MALONEY DILATION N/A 07/08/2013   Procedure: [name] DILATION;  Surgeon: [name], MD;  [facility];  Service: Endoscopy;  Laterality: N/A;  . ROBOT ASSISTED LAPAROSCOPIC NEPHRECTOMY Right 10/14/2013   Procedure: ROBOTIC ASSISTED LAPAROSCOPIC RADICAL NEPHRECTOMY;  Surgeon: [name], MD;  [facility];  Service: Urology;  Laterality: Right;  . SAVORY DILATION N/A 07/08/2013   Procedure: SAVORY DILATION;  Surgeon: [name], MD;  [facility];  Service: Endoscopy;  Laterality: N/A;  . TONSILLECTOMY    . Wart removal      Current Outpatient Prescriptions  Medication Sig Dispense Refill  . amiodarone (PACERONE) 100 MG tablet TAKE ONE TABLET BY MOUTH ONCE DAILY - 90 tablet 2  . gabapentin (NEURONTIN) 300 MG capsule Take 1 capsule (300 mg total) by mouth 2 (two) times daily. 20 capsule 0  . insulin NPH Human (HUMULIN N,NOVOLIN N) 100 UNIT/ML injection Inject 3 Units into the skin 3 (three) times daily before meals.     Marland Kitchen lisinopril-hydrochlorothiazide (PRINZIDE,ZESTORETIC) 20-12.5 MG per tablet Take 1 tablet by mouth every morning.    . metoprolol tartrate (LOPRESSOR) 25 MG tablet Take 1 tablet (25 mg total) by mouth 2 (two) times daily. 60 tablet 0  . hydrocortisone (ANUSOL-HC) 2.5 % rectal cream Place 1 application  rectally 2 (two) times daily. 30 g 1   No current facility-administered medications for this visit.     Allergies as of 01/04/2016 - Review Complete 01/04/2016  Allergen Reaction Noted  . Sulfa antibiotics Itching and Other (See Comments) 04/10/2012    Family History  Problem Relation Age of Onset  . Colon cancer Sister 40  . Cancer Mother   . Heart attack Father   . Hypertension Daughter   . Cancer Maternal Grandfather   .  Diabetes Paternal Grandmother     Social History   Social History  . Marital status: Single    Spouse name: N/A  . Number of children: N/A  . Years of education: N/A   Social History Main Topics  . Smoking status: Current Every Day Smoker    Packs/day: 0.10    Years: 40.00    Types: Cigarettes  . Smokeless tobacco: Never Used  . Alcohol use 0.0 oz/week     Comment: beer once or twice per month, 24-40 ounce.   . Drug use:     Types: Marijuana, Cocaine     Comment: today, cocaine  . Sexual activity: Not Currently    Birth control/ protection: Surgical     Comment: hyst   Other Topics Concern  . None   Social History Narrative  . None    Review of Systems: As mentioned in HPI   Physical Exam: BP (!) 170/88   Pulse 81   Temp 97.2 F (36.2 C) (Oral)   Ht 5' (1.524 m)   Wt 139 lb 14.4 oz (63.5 kg)   BMI 27.32 kg/m  General:   Alert and oriented. No distress noted. Pleasant and cooperative.  Head:  Normocephalic and atraumatic. Eyes:  Conjuctiva clear without scleral icterus. Abdomen:  +BS, soft, non-tender and non-distended. No rebound or guarding. No HSM or masses noted. RECTAL EXAM DECLINED  Msk:  Symmetrical without gross deformities. Normal posture. Extremities:  Without edema. Neurologic:  Alert and  oriented x4 Psych:  Alert and cooperative. Normal mood and affect.  Lab Results  Component Value Date   ALT 31 (H) 11/16/2015   AST 51 (H) 11/16/2015   ALKPHOS 64 11/16/2015   BILITOT 0.4 11/16/2015   Lab Results  Component Value Date   WBC 15.8 (H) 10/20/2015   HGB 14.5 10/20/2015   HCT 42.9 10/20/2015   MCV 89.6 10/20/2015   PLT 223 10/20/2015

## 2016-01-04 NOTE — Assessment & Plan Note (Signed)
Solid stool incontinence. Notes rectal discomfort, itching. Query hemorrhoids playing a role but DECLINES rectal exam. NO rectal bleeding, denies anal intercourse. Colonoscopy fairly up-to-date. Add supplemental fiber, course of Anusol, patient to call if no improvement.

## 2016-01-11 ENCOUNTER — Ambulatory Visit (HOSPITAL_COMMUNITY)
Admission: RE | Admit: 2016-01-11 | Discharge: 2016-01-11 | Disposition: A | Discharge status: Home / Self Care | Payer: Medicare Other | Source: Ambulatory Visit | Attending: Gastroenterology | Admitting: Gastroenterology

## 2016-01-11 DIAGNOSIS — K7460 Unspecified cirrhosis of liver: Secondary | ICD-10-CM | POA: Diagnosis not present

## 2016-01-11 DIAGNOSIS — K8020 Calculus of gallbladder without cholecystitis without obstruction: Secondary | ICD-10-CM | POA: Diagnosis not present

## 2016-01-11 DIAGNOSIS — K7030 Alcoholic cirrhosis of liver without ascites: Secondary | ICD-10-CM | POA: Diagnosis not present

## 2016-01-12 ENCOUNTER — Telehealth: Payer: Self-pay | Admitting: Internal Medicine

## 2016-01-12 NOTE — Telephone Encounter (Signed)
Spoke with the pt, she is really wanting to speak with Vicente Males about taking a rehab class. Please call her at [phone]

## 2016-01-12 NOTE — Telephone Encounter (Signed)
Tried to call pt- NA, no voicemail

## 2016-01-12 NOTE — Telephone Encounter (Signed)
Pt called asking to speak with AB. I told her that AB was seeing her morning patients and I could take a message. Pt asked when AB would be available to speak with her. I told her I would transfer her to the nurse's VM.

## 2016-01-13 NOTE — Telephone Encounter (Signed)
Patient willing to pursue outpatient rehab in January.   Can we refer her to [facility] or something of that nature that does the weekly group and individual sessions?

## 2016-01-19 ENCOUNTER — Telehealth: Payer: Self-pay | Admitting: Internal Medicine

## 2016-01-19 NOTE — Telephone Encounter (Signed)
ANNA WAS SUPPOSED TO REFER PATIENT TO REHAB AND PATIENT WAS SUPPOSED TO CALL AND LET HER KNOW WHEN SHE WANTED TO START, SHE WANTS ANNA TO CALL HER BOYFRIEND BECAUSE HE DOES NOT BELIEVE THAT SHE WILL DO IT.  SHE ALSO STATES THAT SHE WANTS TO START A WEEK AFTER HER BIRTHDAY.

## 2016-01-20 NOTE — Telephone Encounter (Signed)
Referral has been made.

## 2016-01-20 NOTE — Telephone Encounter (Signed)
I have already routed a request for patient to have evaluation for rehab.

## 2016-01-21 IMAGING — DX DG CHEST 2V
2 series · 2 of 2 positions shown · non-contrast
Comparison: 02/10/2014

CLINICAL DATA: Chest pain since yesterday. Productive cough for 3
days.

EXAM:
CHEST  2 VIEW

[chest pa]
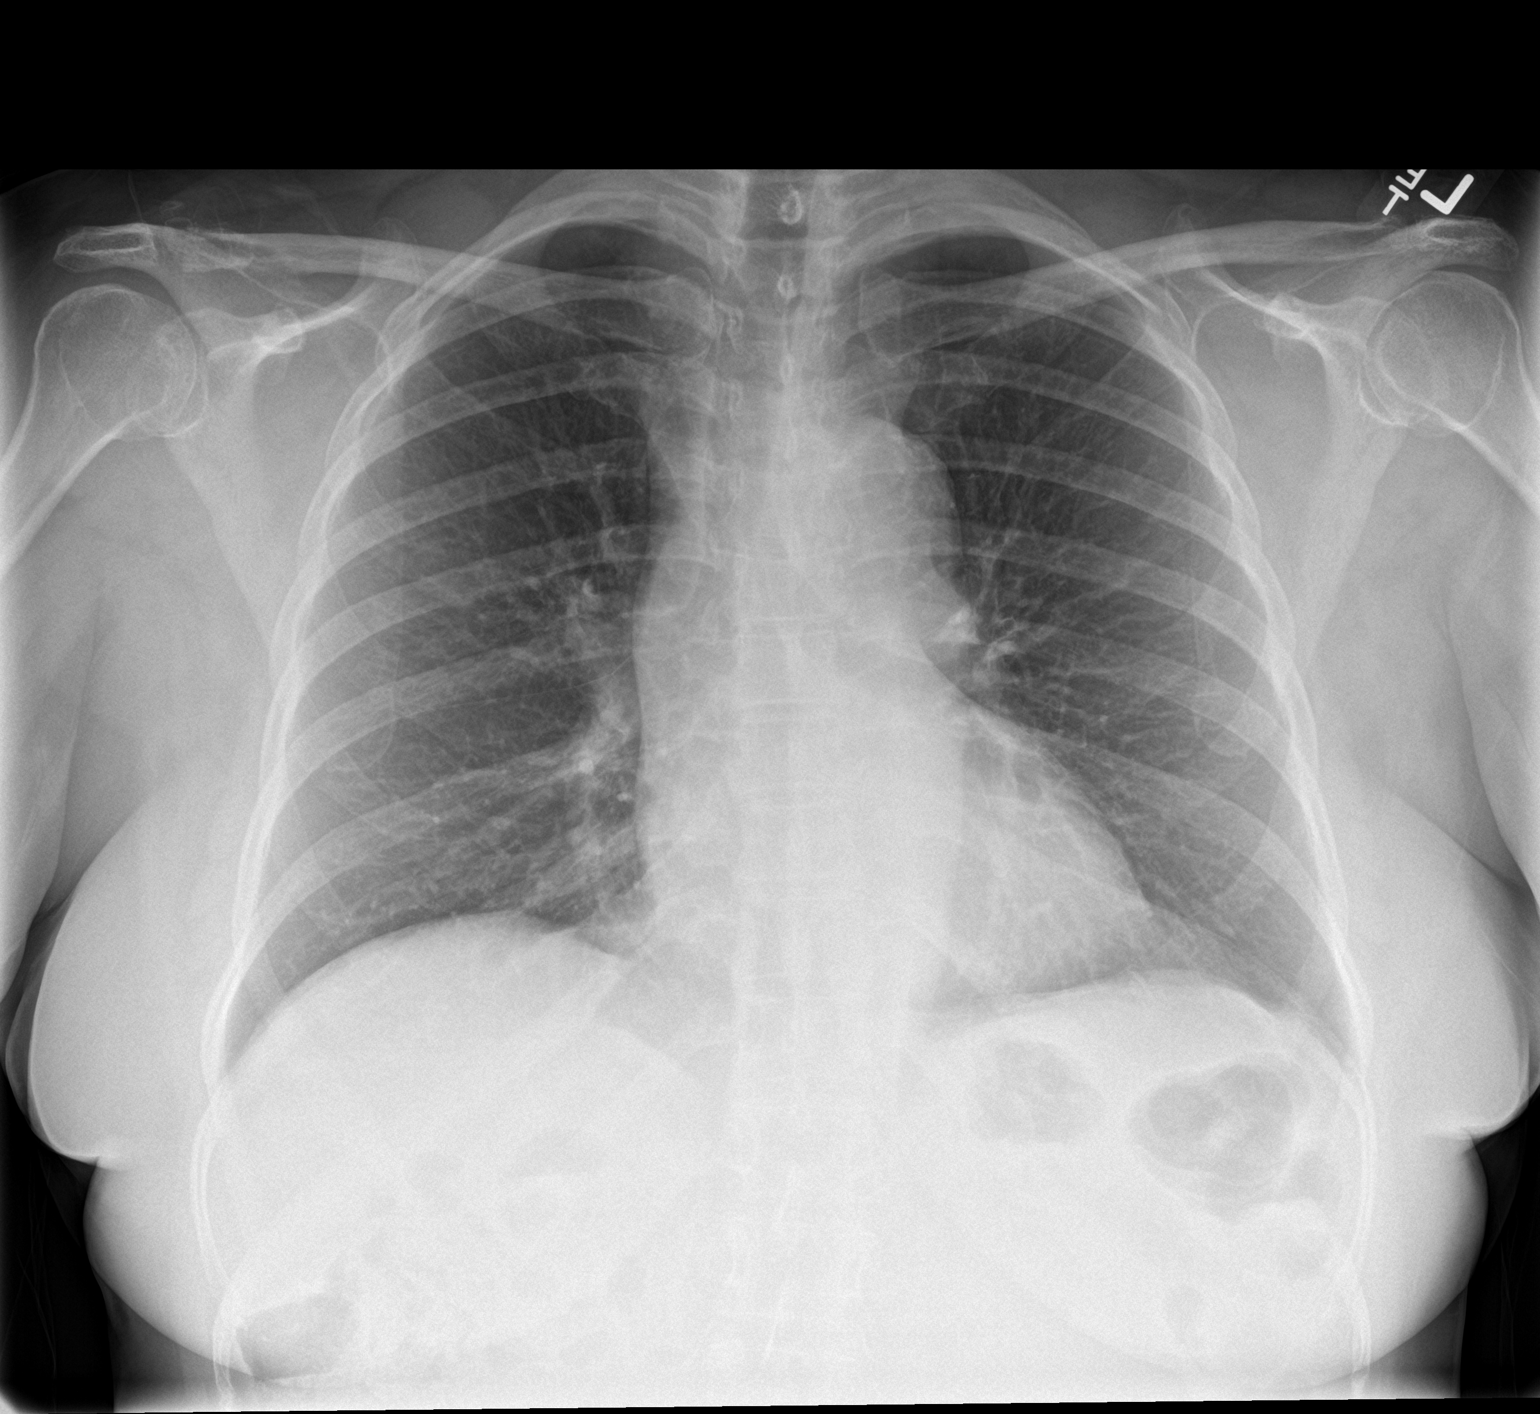

[chest lat]
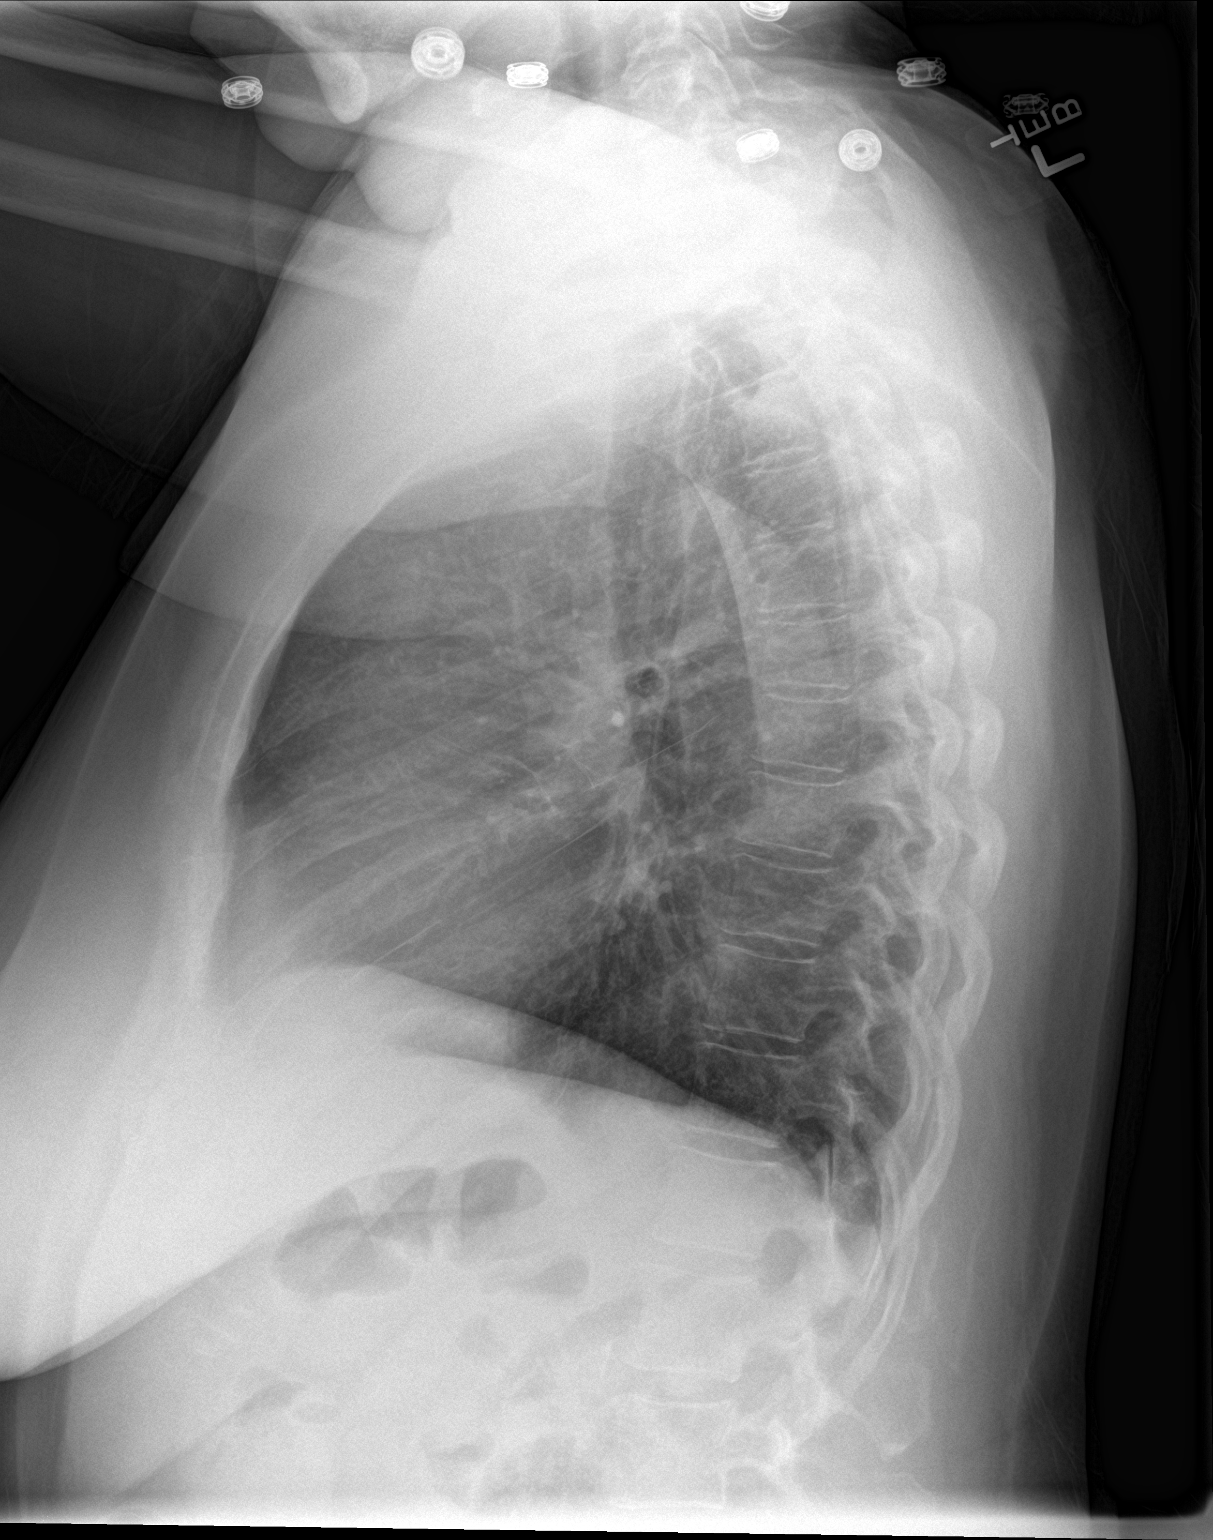

[2 of 2 positions shown; findings below may reference images not displayed]

FINDINGS: Normal heart size. Aortic knob remains mildly prominent. Normal
vascularity otherwise. Clear lungs. No pneumothorax. No pleural
effusion. Thoracic spine is intact. Posttraumatic changes of the
peripheral right clavicle.
IMPRESSION: No active cardiopulmonary disease.

## 2016-01-24 NOTE — Telephone Encounter (Signed)
[facility] called, they do not do substance abuse. Stated Dr. Peggye Ley [phone]) does substance abuse.   AB, please advise.

## 2016-01-26 ENCOUNTER — Emergency Department (HOSPITAL_COMMUNITY)
Admission: EM | Admit: 2016-01-26 | Discharge: 2016-01-27 | Disposition: A | Discharge status: Home / Self Care | Payer: Medicare Other | Attending: Emergency Medicine | Admitting: Emergency Medicine

## 2016-01-26 ENCOUNTER — Encounter (HOSPITAL_COMMUNITY): Payer: Self-pay | Admitting: *Deleted

## 2016-01-26 ENCOUNTER — Other Ambulatory Visit: Payer: Self-pay

## 2016-01-26 DIAGNOSIS — F1410 Cocaine abuse, uncomplicated: Secondary | ICD-10-CM | POA: Insufficient documentation

## 2016-01-26 DIAGNOSIS — E119 Type 2 diabetes mellitus without complications: Secondary | ICD-10-CM | POA: Diagnosis not present

## 2016-01-26 DIAGNOSIS — R6883 Chills (without fever): Secondary | ICD-10-CM | POA: Diagnosis not present

## 2016-01-26 DIAGNOSIS — R05 Cough: Secondary | ICD-10-CM | POA: Diagnosis not present

## 2016-01-26 DIAGNOSIS — I10 Essential (primary) hypertension: Secondary | ICD-10-CM | POA: Insufficient documentation

## 2016-01-26 DIAGNOSIS — R300 Dysuria: Secondary | ICD-10-CM | POA: Diagnosis not present

## 2016-01-26 DIAGNOSIS — F1910 Other psychoactive substance abuse, uncomplicated: Secondary | ICD-10-CM

## 2016-01-26 DIAGNOSIS — R569 Unspecified convulsions: Secondary | ICD-10-CM | POA: Diagnosis not present

## 2016-01-26 DIAGNOSIS — Z794 Long term (current) use of insulin: Secondary | ICD-10-CM | POA: Diagnosis not present

## 2016-01-26 DIAGNOSIS — F17210 Nicotine dependence, cigarettes, uncomplicated: Secondary | ICD-10-CM | POA: Diagnosis not present

## 2016-01-26 DIAGNOSIS — G253 Myoclonus: Secondary | ICD-10-CM | POA: Insufficient documentation

## 2016-01-26 DIAGNOSIS — R51 Headache: Secondary | ICD-10-CM | POA: Diagnosis present

## 2016-01-26 DIAGNOSIS — R400 Somnolence: Secondary | ICD-10-CM

## 2016-01-26 DIAGNOSIS — R062 Wheezing: Secondary | ICD-10-CM | POA: Diagnosis not present

## 2016-01-26 DIAGNOSIS — Z79899 Other long term (current) drug therapy: Secondary | ICD-10-CM | POA: Diagnosis not present

## 2016-01-26 NOTE — Telephone Encounter (Signed)
Referral has been made to [facility].

## 2016-01-26 NOTE — ED Triage Notes (Signed)
Pt c/o generalized body aches and cough x 3 days. Unsure of fevers.

## 2016-01-26 NOTE — Telephone Encounter (Signed)
Did we get her plugged in to [facility]? Do they do rehab?

## 2016-01-26 NOTE — Telephone Encounter (Signed)
Oh, I never had it routed to me. Yes, may refer to Dr. Peggye Ley.

## 2016-01-26 NOTE — Telephone Encounter (Signed)
Please see phone note from 01/12/16. They would not see her.

## 2016-01-27 ENCOUNTER — Emergency Department (HOSPITAL_COMMUNITY): Payer: Medicare Other

## 2016-01-27 DIAGNOSIS — R569 Unspecified convulsions: Secondary | ICD-10-CM | POA: Diagnosis not present

## 2016-01-27 DIAGNOSIS — R05 Cough: Secondary | ICD-10-CM | POA: Diagnosis not present

## 2016-01-27 LAB — INFLUENZA PANEL BY PCR (TYPE A & B)
INFLAPCR: NEGATIVE
INFLBPCR: NEGATIVE

## 2016-01-27 LAB — COMPREHENSIVE METABOLIC PANEL
ALBUMIN: 3.5 g/dL (ref 3.5–5.0)
ALT: 55 U/L — ABNORMAL HIGH (ref 14–54)
ANION GAP: 8 (ref 5–15)
AST: 75 U/L — AB (ref 15–41)
Alkaline Phosphatase: 63 U/L (ref 38–126)
BILIRUBIN TOTAL: 0.7 mg/dL (ref 0.3–1.2)
BUN: 24 mg/dL — AB (ref 6–20)
CO2: 31 mmol/L (ref 22–32)
Calcium: 9.3 mg/dL (ref 8.9–10.3)
Chloride: 96 mmol/L — ABNORMAL LOW (ref 101–111)
Creatinine, Ser: 1.19 mg/dL — ABNORMAL HIGH (ref 0.44–1.00)
GFR calc Af Amer: 55 mL/min — ABNORMAL LOW (ref >60)
GFR calc non Af Amer: 47 mL/min — ABNORMAL LOW (ref >60)
GLUCOSE: 132 mg/dL — AB (ref 65–99)
POTASSIUM: 3 mmol/L — AB (ref 3.5–5.1)
SODIUM: 135 mmol/L (ref 135–145)
Total Protein: 8.2 g/dL — ABNORMAL HIGH (ref 6.5–8.1)

## 2016-01-27 LAB — CBC WITH DIFFERENTIAL/PLATELET
Basophils Absolute: 0 10*3/uL (ref 0.0–0.1)
Basophils Relative: 0 %
Eosinophils Absolute: 0.2 10*3/uL (ref 0.0–0.7)
Eosinophils Relative: 2 %
HEMATOCRIT: 45.7 % (ref 36.0–46.0)
HEMOGLOBIN: 15.2 g/dL — AB (ref 12.0–15.0)
LYMPHS ABS: 5.9 10*3/uL — AB (ref 0.7–4.0)
LYMPHS PCT: 55 %
MCH: 29.2 pg (ref 26.0–34.0)
MCHC: 33.3 g/dL (ref 30.0–36.0)
MCV: 87.9 fL (ref 78.0–100.0)
MONO ABS: 0.4 10*3/uL (ref 0.1–1.0)
MONOS PCT: 4 %
NEUTROS ABS: 4.1 10*3/uL (ref 1.7–7.7)
NEUTROS PCT: 39 %
Platelets: 229 10*3/uL (ref 150–400)
RBC: 5.2 MIL/uL — ABNORMAL HIGH (ref 3.87–5.11)
RDW: 12.7 % (ref 11.5–15.5)
WBC: 10.7 10*3/uL — ABNORMAL HIGH (ref 4.0–10.5)

## 2016-01-27 LAB — ETHANOL: Alcohol, Ethyl (B): <5 mg/dL (ref <5)

## 2016-01-27 LAB — CBG MONITORING, ED
GLUCOSE-CAPILLARY: 84 mg/dL (ref 65–99)
Glucose-Capillary: 92 mg/dL (ref 65–99)

## 2016-01-27 LAB — AMMONIA: Ammonia: 51 umol/L — ABNORMAL HIGH (ref 9–35)

## 2016-01-27 LAB — TSH: TSH: 3.056 u[IU]/mL (ref 0.350–4.500)

## 2016-01-27 LAB — LACTIC ACID, PLASMA: Lactic Acid, Venous: 1.3 mmol/L (ref 0.5–1.9)

## 2016-01-27 LAB — MAGNESIUM: Magnesium: 1.8 mg/dL (ref 1.7–2.4)

## 2016-01-27 LAB — CK: Total CK: 250 U/L — ABNORMAL HIGH (ref 38–234)

## 2016-01-27 MED ORDER — LORAZEPAM 1 MG PO TABS
1.0000 mg | ORAL_TABLET | Freq: Once | ORAL | Status: AC
  Administered 2016-01-27: 1 mg via ORAL
  Filled 2016-01-27: qty 1

## 2016-01-27 MED ORDER — POTASSIUM CHLORIDE CRYS ER 20 MEQ PO TBCR
40.0000 meq | EXTENDED_RELEASE_TABLET | Freq: Once | ORAL | Status: AC
  Administered 2016-01-27: 40 meq via ORAL
  Filled 2016-01-27: qty 2

## 2016-01-27 MED ORDER — DIPHENHYDRAMINE HCL 25 MG PO TABS: 50.0000 mg | ORAL_TABLET | Freq: Every evening | ORAL | 0 refills | Status: DC | PRN

## 2016-01-27 MED ORDER — SODIUM CHLORIDE 0.9 % IV BOLUS (SEPSIS)
1000.0000 mL | Freq: Once | INTRAVENOUS | Status: AC
  Administered 2016-01-27: 1000 mL via INTRAVENOUS

## 2016-01-27 MED ORDER — LORAZEPAM 2 MG/ML IJ SOLN
0.5000 mg | Freq: Once | INTRAMUSCULAR | Status: AC
  Administered 2016-01-27: 0.5 mg via INTRAVENOUS
  Filled 2016-01-27: qty 1

## 2016-01-27 MED ORDER — MELATONIN 1 MG PO CAPS: 1.0000 | ORAL_CAPSULE | Freq: Every evening | ORAL | 0 refills | Status: DC | PRN

## 2016-01-27 NOTE — ED Provider Notes (Signed)
[place] DEPT Provider Note   CSN: [id number] Arrival date & time: 01/26/16  2325     History   Chief Complaint Chief Complaint  Patient presents with  . Generalized Body Aches    HPI Cassandra Jordan is a 66 y.o. female.  Patient states that she has been sick over the last 3 days, but his been having an uncontrollable jerking over the last 2-3 hours. She states she feels very cold.   The history is provided by the patient.  URI   This is a new problem. The current episode started more than 2 days ago. The problem has been gradually worsening. There has been no fever. Associated symptoms include dysuria, congestion, headaches, sinus pain, cough and wheezing. Pertinent negatives include no chest pain, no abdominal pain, no diarrhea, no vomiting, no sore throat and no neck pain. She has tried nothing for the symptoms.    Past Medical History:  Diagnosis Date  . Arthritis   . Asthma   . Essential hypertension   . GERD (gastroesophageal reflux disease)   . Hepatitis C   . Hyperlipidemia   . Lichen sclerosus et atrophicus 10/13/2014  . Neuropathic pain   . PSVT (paroxysmal supraventricular tachycardia) ([place])   . Rash   . Renal carcinoma ([place])   . Type 2 diabetes mellitus ([place])   . Vaginal irritation 10/01/2014  . Vaginal itching 10/01/2014  . Yeast infection of the vagina 10/01/2014    Patient Active Problem List   Diagnosis Date Noted  . Fecal incontinence 01/04/2016  . Hepatic cirrhosis ([place]) 04/08/2015  . History of adenomatous polyp of colon   . History of colonic polyps   . Hx of adenomatous colonic polyps 12/10/2014  . Lichen sclerosus et atrophicus 10/13/2014  . Vaginal itching 10/01/2014  . Vaginal irritation 10/01/2014  . Yeast infection of the vagina 10/01/2014  . Blurred vision, right eye 03/20/2014  . UTI (lower urinary tract infection) 03/20/2014  . Cocaine abuse 03/20/2014  . Generalized weakness 03/20/2014  . Chest pain 02/10/2014  . Chest pain at  rest 02/10/2014  . Type 2 diabetes mellitus ([place])   . Diabetes mellitus, type II ([place])   . Tobacco abuse   . PSVT (paroxysmal supraventricular tachycardia) ([place]) 11/04/2013  . Essential hypertension 10/16/2013  . Renal mass 10/14/2013  . Ovarian mass, left 08/17/2013  . Hepatitis C, chronic ([place]) 07/01/2013  . Elevated LFTs 07/01/2013  . Other dysphagia 07/01/2013  . Encounter for screening colonoscopy 07/01/2013    Past Surgical History:  Procedure Laterality Date  . ABDOMINAL HYSTERECTOMY    . COLONOSCOPY N/A 07/08/2013   RMR: Multiple colonic polyps treated [name] as described above. Inadequate prepartation comprimised examination there colonic diverticulosis. tubular and tubulovillous adenomas. next TCS 12/2013  . COLONOSCOPY N/A 02/09/2015   RMR: Colonic polyp removed as described above. Tubular adenoma. Next colonoscopy January 2022.  . ESOPHAGOGASTRODUODENOSCOPY N/A 07/08/2013   RMR: Schatzki's ring ; small hiatal hernia-status post passage of a Maloney dilator  . MALONEY DILATION N/A 07/08/2013   Procedure: [name] DILATION;  Surgeon: [name], MD;  Location: [facility]ITE;  Service: Endoscopy;  Laterality: N/A;  . ROBOT ASSISTED LAPAROSCOPIC NEPHRECTOMY Right 10/14/2013   Procedure: ROBOTIC ASSISTED LAPAROSCOPIC RADICAL NEPHRECTOMY;  Surgeon: [name], MD;  Location: [facility] ORS;  Service: Urology;  Laterality: Right;  . SAVORY DILATION N/A 07/08/2013   Procedure: SAVORY DILATION;  Surgeon: [name], MD;  Location: [facility] SUITE;  Service: Endoscopy;  Laterality: N/A;  .  TONSILLECTOMY    . Wart removal      OB History    Gravida Para Term Preterm AB Living   1 1       1    SAB TAB Ectopic Multiple Live Births                   Home Medications    Prior to Admission medications   Medication Sig Start Date End Date Taking? Authorizing Provider  amiodarone (PACERONE) 100 MG tablet TAKE ONE TABLET BY MOUTH ONCE DAILY - 11/16/15   Satira Sark, MD    gabapentin (NEURONTIN) 300 MG capsule Take 1 capsule (300 mg total) by mouth 2 (two) times daily. 11/06/14   Francine Graven, DO  hydrocortisone (ANUSOL-HC) 2.5 % rectal cream Place 1 application rectally 2 (two) times daily. 01/04/16   Annitta Needs, NP  insulin NPH Human (HUMULIN N,NOVOLIN N) 100 UNIT/ML injection Inject 3 Units into the skin 3 (three) times daily before meals.     Historical Provider, MD  lisinopril-hydrochlorothiazide (PRINZIDE,ZESTORETIC) 20-12.5 MG per tablet Take 1 tablet by mouth every morning.    Historical Provider, MD  metoprolol tartrate (LOPRESSOR) 25 MG tablet Take 1 tablet (25 mg total) by mouth 2 (two) times daily. 11/15/15   Imogene Burn, PA-C    Family History Family History  Problem Relation Age of Onset  . Colon cancer Sister 74  . Cancer Mother   . Heart attack Father   . Hypertension Daughter   . Cancer Maternal Grandfather   . Diabetes Paternal Grandmother     Social History Social History  Substance Use Topics  . Smoking status: Current Every Day Smoker    Packs/day: 0.10    Years: 40.00    Types: Cigarettes  . Smokeless tobacco: Never Used  . Alcohol use 0.0 oz/week     Comment: beer once or twice per month, 24-40 ounce.      Allergies   Sulfa antibiotics   Review of Systems Review of Systems  Constitutional: Negative for activity change.       All ROS Neg except as noted in HPI  HENT: Positive for congestion and sinus pain. Negative for nosebleeds and sore throat.   Eyes: Negative for photophobia and discharge.  Respiratory: Positive for cough and wheezing. Negative for shortness of breath.   Cardiovascular: Negative for chest pain and palpitations.  Gastrointestinal: Negative for abdominal pain, blood in stool, diarrhea and vomiting.  Genitourinary: Positive for dysuria. Negative for frequency and hematuria.  Musculoskeletal: Positive for arthralgias. Negative for back pain and neck pain.  Skin: Negative.   Neurological:  Positive for headaches. Negative for dizziness, seizures and speech difficulty.  Psychiatric/Behavioral: Negative for confusion and hallucinations.     Physical Exam Updated Vital Signs BP (!) 194/106   Pulse 82   Temp 98.9 F (37.2 C) (Oral)   Resp 20   Ht 5' (1.524 m)   Wt 65.3 kg   SpO2 94%   BMI 28.12 kg/m   Physical Exam  Constitutional: She is oriented to person, place, and time. She appears well-developed and well-nourished.  Non-toxic appearance.  HENT:  Head: Normocephalic.  Right Ear: Tympanic membrane and external ear normal.  Left Ear: Tympanic membrane and external ear normal.  Nasal congestion present.  Eyes: EOM and lids are normal. Pupils are equal, round, and reactive to light.  Neck: Normal range of motion. Neck supple. Carotid bruit is not present.  Cardiovascular: Normal rate, regular rhythm,  normal heart sounds, intact distal pulses and normal pulses.   Pulmonary/Chest: Breath sounds normal. No respiratory distress.  Abdominal: Soft. Bowel sounds are normal. There is no tenderness. There is no guarding.  Musculoskeletal: Normal range of motion.  There is pain with attempted range of motion of the left lower extremity. This pain with movement of the left hip, as well as the left knee. The patient states this is not new for her, but related to her arthritis.  Lymphadenopathy:       Head (right side): No submandibular adenopathy present.       Head (left side): No submandibular adenopathy present.    She has no cervical adenopathy.  Neurological: She is alert and oriented to person, place, and time. She has normal strength. No cranial nerve deficit or sensory deficit.  There is a constant jerk type sensation throughout my interview and during the examination.  Skin: Skin is warm and dry.  Psychiatric: She has a normal mood and affect. Her speech is normal.  Nursing note and vitals reviewed.    ED Treatments / Results  Labs (all labs ordered are listed,  but only abnormal results are displayed) Labs Reviewed - No data to display  EKG  EKG Interpretation None       Radiology No results found.  Procedures Procedures (including critical care time)  Medications Ordered in ED Medications - No data to display   Initial Impression / Assessment and Plan / ED Course  I have reviewed the triage vital signs and the nursing notes.  Pertinent labs & imaging results that were available during my care of the patient were reviewed by me and considered in my medical decision making (see chart for details).  Clinical Course   Pt seen with me by Dr Wyvonnia Dusky.  **I have reviewed nursing notes, vital signs, and all appropriate lab and imaging results for this patient. He is having no her white count slowly 10,700 and lactic is normal potassium was also low behavioral low potassium and kidney function explain that one kidney just x-ray looks okay right*  Final Clinical Impressions(s) / ED Diagnoses  Patient has a constant jerking/ grunting motion during my interview and examination . Blood pressure is elevated at 194/106, otherwise the vital signs within normal limits. The pulse oximetry is 94% on room air.  The competence of metabolic panel shows potassium to be low at 3.0, the glucose slightly elevated at 132, the creatinine is elevated at 1.19, and the BUN is elevated at 24 (patient has one kidney). Anion gap is normal at 8. The lactic acid is normal at 1.3, the complete blood count shows the white blood cells to be slightly elevated at 10,700, the platelets are within normal limits. No shift to the left noted.  Chest x-ray is negative for acute problem.  Rectal temperature is within normal limits.   UA pending. Will observe for improvement following IV ativan.  Pt care to be continued by Dr Wyvonnia Dusky.   Final diagnoses:  Myoclonic jerking  Cocaine abuse  Sleepiness  Chills    New Prescriptions Discharge Medication List as of 01/27/2016   1:01 PM    START taking these medications   Details  diphenhydrAMINE (BENADRYL) 25 MG tablet Take 2 tablets (50 mg total) by mouth at bedtime as needed for sleep., Starting Fri 01/27/2016, Print    Melatonin 1 MG CAPS Take 1 capsule (1 mg total) by mouth at bedtime as needed (sleep)., Starting Fri 01/27/2016, Print  Lily Kocher, PA-C 01/28/16 [place], MD 01/28/16 2242

## 2016-01-27 NOTE — ED Notes (Signed)
Pt has equal rise and fall noted to her chest. Pt is arousable but will not stay awake. Room lights turned on bright and tv turned on with volume up.

## 2016-01-27 NOTE — ED Notes (Signed)
Attempted to get pt up and get her dressed so she could be discharged. Pt very groggy and not much help with getting herself dressed. EDP in to see pt and states that this pt needs to be more alert before pt is to be discharged.

## 2016-01-27 NOTE — ED Notes (Signed)
Attempted once again to wake this pt and get her dressed to discharge her home. Pt still very sleepy. EDP in to retrieve pt's discharge papers and the EDP is going to give report on this pt to the next EDP. Pt still to sleepy for discharge. Pt's boyfriend states that the pt has not slept in 2 days due to crack use. Pt was given 0.80m Ativan IV at 0100 and a 1000 ml fluid bolus. Pt rested and had quit making the jerking movements. Pt was ambulated to the restroom at around 0330 and when pt was walking back to her room, she began having the same jerking movements that she states she gets when "she is cold." Pt given 1 mg Ativan PO at 0345 for the jerking movements and they decreased after getting the medicine. Pt has been sleeping soundly. Pt's o2 was 98%.

## 2016-01-27 NOTE — ED Provider Notes (Signed)
12:59 PM Assumed care from Dr. Wyvonnia Dusky, please see their note for full history, physical and decision making until this point. In brief this is a 66 y.o. year old female who presented to the ED tonight with Generalized Body Aches     Patient had been on for at least 2 days doing crack cocaine and came in with intermittent body shaking which she had real cold. She had one episode while she was here so they gave her Ativan for these tremors. She has been sleeping since that time. Vital signs have been normal, her labs are reassuring, doubt acute causes for symptoms. Neuro exam is appropriate. Appears well. States that she is still sleepy and would like me to give her some Ativan to go home. I discussed that this is not appropriate use of the medication and that her tremors are likely from being cold as she explained. I will suggest Benadryl and PCP follow up for her sleep.  Discharge instructions, including strict return precautions for new or worsening symptoms, given. Patient and/or family verbalized understanding and agreement with the plan as described.   Labs, studies and imaging reviewed by myself and considered in medical decision making if ordered. Imaging interpreted by radiology.  Labs Reviewed  COMPREHENSIVE METABOLIC PANEL - Abnormal; Notable for the following:       Result Value   Potassium 3.0 (*)    Chloride 96 (*)    Glucose, Bld 132 (*)    BUN 24 (*)    Creatinine, Ser 1.19 (*)    Total Protein 8.2 (*)    AST 75 (*)    ALT 55 (*)    GFR calc non Af Amer 47 (*)    GFR calc Af Amer 55 (*)    All other components within normal limits  CBC WITH DIFFERENTIAL/PLATELET - Abnormal; Notable for the following:    WBC 10.7 (*)    RBC 5.20 (*)    Hemoglobin 15.2 (*)    Lymphs Abs 5.9 (*)    All other components within normal limits  AMMONIA - Abnormal; Notable for the following:    Ammonia 51 (*)    All other components within normal limits  CK - Abnormal; Notable for the  following:    Total CK 250 (*)    All other components within normal limits  LACTIC ACID, PLASMA  TSH  MAGNESIUM  INFLUENZA PANEL BY PCR (TYPE A & B, H1N1)  ETHANOL  URINALYSIS, ROUTINE W REFLEX MICROSCOPIC  CBG MONITORING, ED  CBG MONITORING, ED    CT Head Wo Contrast  Final Result    DG Chest 2 View  Final Result      No Follow-up on file.    Merrily Pew, MD 01/27/16 1259

## 2016-01-27 NOTE — ED Notes (Addendum)
Pt's boyfriend came out of the room stating that the pt didn't start "acting like this" until she "smoked crack at around 9:30pm." Pt's boyfriend was referring to her jerking motions.

## 2016-01-27 NOTE — ED Notes (Signed)
Pt making jerking movements in the bed stating she has been making these movements since she got cold earlier.

## 2016-01-27 NOTE — ED Notes (Signed)
This nurse and tech went in to evaluate pt. She is more arousable than previously noted. Pt has clothes at bedside and told to put them on if she felt she could. Pt was sleeping again before nurse left the room.

## 2016-01-27 NOTE — ED Notes (Signed)
Pt given water to drink. 

## 2016-01-27 NOTE — ED Provider Notes (Signed)
Medical screening examination/treatment/procedure(s) were conducted as a shared visit with non-physician practitioner(s) and myself.  I personally evaluated the patient during the encounter.  Patient with history of diabetes, renal cell carcinoma status post resection, hepatitis C, cocaine abuse (last use yesterday) presenting with several day history of body aches and dry cough. No fever. This evening she developed "jerking movements" in her arms and legs that she found difficult to control associated with chills.  On exam patient has already received Ativan. She is oriented x3. There is intermittent myoclonic jerking of her upper and lower extremities that is mild. No meningismus. Lungs are clear. Heart is regular. Abdomen is soft. 5/5 strength throughout with no clonus. Unable to elicit patellar reflexes bilaterally.  Labs show mild hypokalemia and CK elevation. She is given IV fluids. Labs otherwise reassuring.  Ammonia minimally elevated, LFTs at baseline. Suspect her myoclonic jerking is due to cocaine abuse.  Denies SI or HI.  Multiple attempts to get urine sample unsuccessful. Patient refuses catheterization.  Sedated after ativan. ETOH negative.   Will need to be more awake prior to discharge.   EKG Interpretation  Date/Time:  Friday January 27 2016 03:25:21 EST Ventricular Rate:  78 PR Interval:    QRS Duration: 92 QT Interval:  445 QTC Calculation: 507 R Axis:   22 Text Interpretation:  Sinus rhythm Left ventricular hypertrophy Prolonged QT interval No significant change was found Confirmed by Wyvonnia Dusky  MD, Alley Neils ([id number]) on 01/27/2016 3:29:41 AM         Ezequiel Essex, MD 01/27/16 [id number]

## 2016-01-27 NOTE — ED Notes (Signed)
Pt is arousable to stimulation. Pt will not stay awake to talk to this nurse.

## 2016-01-27 NOTE — ED Notes (Signed)
Pt not alert at this time. Pt is arousable by her spouse but will no stay awake

## 2016-01-27 NOTE — Discharge Instructions (Addendum)
Stop abusing cocaine. Followup with your doctor. Return to the ED if you develop new or worsening symptoms.

## 2016-01-27 NOTE — ED Notes (Signed)
Pt arousable but not fully alert. Pt given clothes.

## 2016-01-27 NOTE — ED Notes (Signed)
Pt ambulated to the restroom with minimal assist. Pt was aware that we needed a urine specimen and she dropped the urine cup in the toilet. Pt started the jerking movements when she was about to get back in the bed. EDP aware and was in to see pt.

## 2016-01-27 NOTE — ED Notes (Signed)
Informed pt that we still needed a urine sample, pt stated that she thinks "I can go now". Ambulated pt to bathroom, after a few minutes pt stated "I guess I don't have to go".

## 2016-01-27 NOTE — ED Notes (Signed)
Family at bedside talking with patient. Patient sitting in stretcher will speak to family then appear to doze off.

## 2016-03-06 ENCOUNTER — Encounter: Payer: Self-pay | Admitting: Gastroenterology

## 2016-03-06 ENCOUNTER — Emergency Department (HOSPITAL_COMMUNITY)
Admission: EM | Admit: 2016-03-06 | Discharge: 2016-03-06 | Disposition: A | Discharge status: Home / Self Care | Payer: Medicare Other | Attending: Emergency Medicine | Admitting: Emergency Medicine

## 2016-03-06 ENCOUNTER — Ambulatory Visit (INDEPENDENT_AMBULATORY_CARE_PROVIDER_SITE_OTHER): Payer: Medicare Other | Admitting: Gastroenterology

## 2016-03-06 ENCOUNTER — Encounter (HOSPITAL_COMMUNITY): Payer: Self-pay | Admitting: Emergency Medicine

## 2016-03-06 VITALS — BP 218/120 | HR 62 | Temp 97.7°F | Ht 60.0 in | Wt 149.4 lb

## 2016-03-06 DIAGNOSIS — R51 Headache: Secondary | ICD-10-CM | POA: Diagnosis not present

## 2016-03-06 DIAGNOSIS — Z79899 Other long term (current) drug therapy: Secondary | ICD-10-CM | POA: Insufficient documentation

## 2016-03-06 DIAGNOSIS — K7030 Alcoholic cirrhosis of liver without ascites: Secondary | ICD-10-CM

## 2016-03-06 DIAGNOSIS — E119 Type 2 diabetes mellitus without complications: Secondary | ICD-10-CM | POA: Insufficient documentation

## 2016-03-06 DIAGNOSIS — I10 Essential (primary) hypertension: Secondary | ICD-10-CM | POA: Insufficient documentation

## 2016-03-06 DIAGNOSIS — R1319 Other dysphagia: Secondary | ICD-10-CM | POA: Diagnosis not present

## 2016-03-06 DIAGNOSIS — Z794 Long term (current) use of insulin: Secondary | ICD-10-CM | POA: Insufficient documentation

## 2016-03-06 DIAGNOSIS — J45909 Unspecified asthma, uncomplicated: Secondary | ICD-10-CM | POA: Diagnosis not present

## 2016-03-06 DIAGNOSIS — F17210 Nicotine dependence, cigarettes, uncomplicated: Secondary | ICD-10-CM | POA: Insufficient documentation

## 2016-03-06 DIAGNOSIS — R519 Headache, unspecified: Secondary | ICD-10-CM

## 2016-03-06 LAB — I-STAT CHEM 8, ED
BUN: 14 mg/dL (ref 6–20)
CREATININE: 0.9 mg/dL (ref 0.44–1.00)
Calcium, Ion: 1.16 mmol/L (ref 1.15–1.40)
Chloride: 96 mmol/L — ABNORMAL LOW (ref 101–111)
Glucose, Bld: 106 mg/dL — ABNORMAL HIGH (ref 65–99)
HEMATOCRIT: 46 % (ref 36.0–46.0)
Hemoglobin: 15.6 g/dL — ABNORMAL HIGH (ref 12.0–15.0)
POTASSIUM: 3.5 mmol/L (ref 3.5–5.1)
SODIUM: 138 mmol/L (ref 135–145)
TCO2: 35 mmol/L (ref 0–100)

## 2016-03-06 MED ORDER — PANTOPRAZOLE SODIUM 40 MG PO TBEC: 40.0000 mg | DELAYED_RELEASE_TABLET | Freq: Every day | ORAL | 3 refills | Status: DC

## 2016-03-06 MED ORDER — ACETAMINOPHEN 500 MG PO TABS
1000.0000 mg | ORAL_TABLET | Freq: Once | ORAL | Status: AC
  Administered 2016-03-06: 1000 mg via ORAL
  Filled 2016-03-06: qty 2

## 2016-03-06 NOTE — Patient Instructions (Signed)
Start taking Protonix once each morning, 30 minutes before breakfast. I sent this to the pharmacy.   I recommend you go to the emergency room because of your blood pressure. We are making an appointment for you to see Dr. Karie Kirks to go over your medications in the next few days.   I would like to see you in 4 weeks and make sure your blood pressure is better before we arrange an upper endoscopy!  If you have any chest pain, shortness of breath, blurred vision, call 911. This can be a life-threatening emergency.

## 2016-03-06 NOTE — Progress Notes (Signed)
cc'ed to pcp °

## 2016-03-06 NOTE — ED Provider Notes (Signed)
[facility] DEPT Provider Note   CSN: [id number] Arrival date & time: 03/06/16  1203     History   Chief Complaint Chief Complaint  Patient presents with  . Hypertension   Triage note:  PT was at GI doctor today, she had not taken her BP medication, BP was elevated they wanted her to come to ED. Pt took her BP medication just prior to her appointment.   HPI Cassandra Jordan is a 66 y.o. female.  The history is provided by the patient.  Hypertension  This is a chronic problem. Episode onset: several years. The problem occurs daily. The problem has not changed since onset.Associated symptoms include headaches (mild, improved sinces her BP has improved.). Pertinent negatives include no chest pain, no abdominal pain and no shortness of breath. Nothing aggravates the symptoms. The symptoms are relieved by medications. Treatments tried: lisinopril/HCTZ, HCTZ, and metoprolol. The treatment provided moderate relief.    Past Medical History:  Diagnosis Date  . Arthritis   . Asthma   . Essential hypertension   . GERD (gastroesophageal reflux disease)   . Hepatitis C   . Hyperlipidemia   . Lichen sclerosus et atrophicus 10/13/2014  . Neuropathic pain   . PSVT (paroxysmal supraventricular tachycardia) ([place])   . Rash   . Renal carcinoma ([place])   . Type 2 diabetes mellitus ([place])   . Vaginal irritation 10/01/2014  . Vaginal itching 10/01/2014  . Yeast infection of the vagina 10/01/2014    Patient Active Problem List   Diagnosis Date Noted  . Fecal incontinence 01/04/2016  . Hepatic cirrhosis ([place]) 04/08/2015  . History of adenomatous polyp of colon   . History of colonic polyps   . Hx of adenomatous colonic polyps 12/10/2014  . Lichen sclerosus et atrophicus 10/13/2014  . Vaginal itching 10/01/2014  . Vaginal irritation 10/01/2014  . Yeast infection of the vagina 10/01/2014  . Blurred vision, right eye 03/20/2014  . UTI (lower urinary tract infection) 03/20/2014  . Cocaine  abuse 03/20/2014  . Generalized weakness 03/20/2014  . Chest pain 02/10/2014  . Chest pain at rest 02/10/2014  . Type 2 diabetes mellitus ([place])   . Diabetes mellitus, type II ([place])   . Tobacco abuse   . PSVT (paroxysmal supraventricular tachycardia) ([place]) 11/04/2013  . Essential hypertension 10/16/2013  . Renal mass 10/14/2013  . Ovarian mass, left 08/17/2013  . Hepatitis C, chronic ([place]) 07/01/2013  . Elevated LFTs 07/01/2013  . Other dysphagia 07/01/2013  . Encounter for screening colonoscopy 07/01/2013    Past Surgical History:  Procedure Laterality Date  . ABDOMINAL HYSTERECTOMY    . COLONOSCOPY N/A 07/08/2013   RMR: Multiple colonic polyps treated [name] as described above. Inadequate prepartation comprimised examination there colonic diverticulosis. tubular and tubulovillous adenomas. next TCS 12/2013  . COLONOSCOPY N/A 02/09/2015   RMR: Colonic polyp removed as described above. Tubular adenoma. Next colonoscopy January 2022.  . ESOPHAGOGASTRODUODENOSCOPY N/A 07/08/2013   RMR: Schatzki's ring ; small hiatal hernia-status post passage of a Maloney dilator  . MALONEY DILATION N/A 07/08/2013   Procedure: [name] DILATION;  Surgeon: [name], MD;  [facility];  Service: Endoscopy;  Laterality: N/A;  . ROBOT ASSISTED LAPAROSCOPIC NEPHRECTOMY Right 10/14/2013   Procedure: ROBOTIC ASSISTED LAPAROSCOPIC RADICAL NEPHRECTOMY;  Surgeon: [name], MD;  Location: [facility];  Service: Urology;  Laterality: Right;  . SAVORY DILATION N/A 07/08/2013   Procedure: SAVORY DILATION;  Surgeon: [name], MD;  Location[facility];  Service:  Endoscopy;  Laterality: N/A;  . TONSILLECTOMY    . Wart removal      OB History    Gravida Para Term Preterm AB Living   1 1       1    SAB TAB Ectopic Multiple Live Births                   Home Medications    Prior to Admission medications   Medication Sig Start Date End Date Taking? Authorizing Provider  amiodarone  (PACERONE) 100 MG tablet TAKE ONE TABLET BY MOUTH ONCE DAILY - 11/16/15   Satira Sark, MD  diphenhydrAMINE (BENADRYL) 25 MG tablet Take 2 tablets (50 mg total) by mouth at bedtime as needed for sleep. 01/27/16   Merrily Pew, MD  gabapentin (NEURONTIN) 300 MG capsule Take 1 capsule (300 mg total) by mouth 2 (two) times daily. 11/06/14   Francine Graven, DO  hydrochlorothiazide (HYDRODIURIL) 12.5 MG tablet Take 1 tablet by mouth daily. 03/02/16   Historical Provider, MD  hydrocortisone (ANUSOL-HC) 2.5 % rectal cream Place 1 application rectally 2 (two) times daily. Patient not taking: Reported on 03/06/2016 01/04/16   Annitta Needs, NP  insulin NPH Human (HUMULIN N,NOVOLIN N) 100 UNIT/ML injection Inject 3 Units into the skin 3 (three) times daily before meals.     Historical Provider, MD  lisinopril-hydrochlorothiazide (PRINZIDE,ZESTORETIC) 20-25 MG tablet Take 1 tablet by mouth daily.    Historical Provider, MD  Melatonin 1 MG CAPS Take 1 capsule (1 mg total) by mouth at bedtime as needed (sleep). 01/27/16   Merrily Pew, MD  metoprolol succinate (TOPROL-XL) 50 MG 24 hr tablet Take 50 mg by mouth daily. 03/02/16   Historical Provider, MD  pantoprazole (PROTONIX) 40 MG tablet Take 1 tablet (40 mg total) by mouth daily. Take 30 minutes before breakfast 03/06/16   Annitta Needs, NP    Family History Family History  Problem Relation Age of Onset  . Colon cancer Sister 63  . Cancer Mother   . Heart attack Father   . Hypertension Daughter   . Cancer Maternal Grandfather   . Diabetes Paternal Grandmother     Social History Social History  Substance Use Topics  . Smoking status: Current Every Day Smoker    Packs/day: 0.10    Years: 40.00    Types: Cigarettes  . Smokeless tobacco: Never Used  . Alcohol use No     Comment: rarely     Allergies   Sulfa antibiotics   Review of Systems Review of Systems  Constitutional: Negative for chills and fever.  HENT: Negative for ear pain and sore  throat.   Eyes: Negative for pain and visual disturbance.  Respiratory: Negative for cough and shortness of breath.   Cardiovascular: Negative for chest pain and palpitations.  Gastrointestinal: Negative for abdominal pain and vomiting.  Genitourinary: Negative for dysuria and hematuria.  Musculoskeletal: Negative for arthralgias and back pain.  Skin: Negative for color change and rash.  Neurological: Positive for headaches (mild, improved sinces her BP has improved.). Negative for seizures and syncope.  All other systems reviewed and are negative.    Physical Exam Updated Vital Signs BP (!) 210/95   Pulse (!) 59   Temp 98.8 F (37.1 C) (Oral)   Resp 16   Ht 5' (1.524 m)   Wt 149 lb (67.6 kg)   SpO2 99%   BMI 29.10 kg/m   Physical Exam  Neurological:  Mental Status: Alert and oriented  to person, place, and time. Attention and concentration normal. Speech clear. Recent memory is intac  Cranial Nerves  II Visual Fields: Intact to confrontation. Visual fields intact. III, IV, VI: Pupils equal and reactive to light and near. Full eye movement without nystagmus  V Facial Sensation: Normal. No weakness of masticatory muscles  VII: No facial weakness or asymmetry  VIII Auditory Acuity: Grossly normal  IX/X: The uvula is midline; the palate elevates symmetrically  XI: Normal sternocleidomastoid and trapezius strength  XII: The tongue is midline. No atrophy or fasciculations.   Motor System: Muscle Strength: 5/5 and symmetric in the upper and lower extremities. No pronation or drift.  Muscle Tone: Tone and muscle bulk are normal in the upper and lower extremities.   Reflexes: DTRs: 2+ and symmetrical in all four extremities. Plantar responses are flexor bilaterally.  Coordination: Intact finger-to-nose, and rapid alternating movements. No tremor.  Sensation: Intact to light touch, and pinprick.  Gait: Routine gait normal       ED Treatments / Results  Labs (all labs ordered  are listed, but only abnormal results are displayed) Labs Reviewed  I-STAT CHEM 8, ED - Abnormal; Notable for the following:       Result Value   Chloride 96 (*)    Glucose, Bld 106 (*)    Hemoglobin 15.6 (*)    All other components within normal limits    EKG  EKG Interpretation None       Radiology No results found.  Procedures Procedures (including critical care time)  Medications Ordered in ED Medications  acetaminophen (TYLENOL) tablet 1,000 mg (1,000 mg Oral Given 03/06/16 1705)     Initial Impression / Assessment and Plan / ED Course  I have reviewed the triage vital signs and the nursing notes.  Pertinent labs & imaging results that were available during my care of the patient were reviewed by me and considered in my medical decision making (see chart for details).     Significant HTN with SBP in 200s upon arrival with associated mild headache. Typical headache for the pt. Non focal neuro exam. No recent head trauma. No fever. Doubt meningitis. Doubt intracranial bleed. Doubt IIH. No indication for imaging. Improved since BP improvement while in waiting room. Further improvement with Tylenol and food. No other symptoms to suggest end-organ damage. Labs w/o renal insufficiency. The patient is safe for discharge with strict return precautions.     Final Clinical Impressions(s) / ED Diagnoses   Final diagnoses:  Hypertension, unspecified type  Nonintractable episodic headache, unspecified headache type   Disposition: Discharge  Condition: Good  I have discussed the results, Dx and Tx plan with the patient who expressed understanding and agree(s) with the plan. Discharge instructions discussed at great length. The patient was given strict return precautions who verbalized understanding of the instructions. No further questions at time of discharge.    New Prescriptions   No medications on file    Follow Up: Lemmie Evens, MD [place] [address] [phone]  Schedule an appointment as soon as possible for a visit  As needed      Fatima Blank, MD 03/06/16 1754

## 2016-03-06 NOTE — ED Notes (Signed)
Pt requested bp checkx3, blood pressure checked.  Also stated she had not eaten, pt given graham crackers and water.  Pt states that she may leave.  Pt encouraged to stay and was notified of risks of leaving without being seen.

## 2016-03-06 NOTE — Progress Notes (Signed)
Referring Provider: Wenda Low, MD Primary Care Physician:  Robert Bellow, MD Primary GI: Dr. Gala Romney   Chief Complaint  Patient presents with  . Cirrhosis    f/u, doing ok    HPI:   Cassandra Jordan is a 66 y.o. female presenting today with a history of ETOH/HCV cirrhosis, non-compliant with ETOH and drug abstinence historically. Needs Hep C treatment (genotype 1b) but not a candidate while still using illicit drugs. Due for screening EGD 2018. US abdomen up-to-date and due again in June 2018. Has one more Hep B shot left in the series.   She has been referred to [facility] (Dr. Peggye Ley) due to substance abuse. Does not appear this was arranged. States she can't remember the last time she drank alcohol, states she doesn't really have the taste for it but may "drink a little mild wine here and there". States she has "slowed down on cocaine use". Doesn't feel she needs rehab. States she is not sure if she has done a drug test. No abdominal pain. No N/V. Food gets "stuck in my throat". Has reflux. No rectal bleeding. No confusion or mental status changes. States sometimes she will forget the names of something but appears age-related. Has been living at the same house 40 years and wants a change of scenery. Now lives in a senior citizen apartment, still in process of moving.   Her BP is markedly elevated today (223/128), but she tells me she has not taken her BP medication till just before I came in. Recheck was 218/120. There is confusion regarding what she is supposed to be taking. Appears she is taking HCT 12.5 mg along with Lisinopril/HCTZ 20/25. She was thinking she was only supposed to be taking HCTZ, then wonders if she is supposed to be taking both. I have asked her to speak to her PCP about this.    Past Medical History:  Diagnosis Date  . Arthritis   . Asthma   . Essential hypertension   . GERD (gastroesophageal reflux disease)   . Hepatitis C   . Hyperlipidemia     . Lichen sclerosus et atrophicus 10/13/2014  . Neuropathic pain   . PSVT (paroxysmal supraventricular tachycardia) ([place])   . Rash   . Renal carcinoma ([place])   . Type 2 diabetes mellitus ([place])   . Vaginal irritation 10/01/2014  . Vaginal itching 10/01/2014  . Yeast infection of the vagina 10/01/2014    Past Surgical History:  Procedure Laterality Date  . ABDOMINAL HYSTERECTOMY    . COLONOSCOPY N/A 07/08/2013   RMR: Multiple colonic polyps treated [name] as described above. Inadequate prepartation comprimised examination there colonic diverticulosis. tubular and tubulovillous adenomas. next TCS 12/2013  . COLONOSCOPY N/A 02/09/2015   RMR: Colonic polyp removed as described above. Tubular adenoma. Next colonoscopy January 2022.  . ESOPHAGOGASTRODUODENOSCOPY N/A 07/08/2013   RMR: Schatzki's ring ; small hiatal hernia-status post passage of a Maloney dilator  . MALONEY DILATION N/A 07/08/2013   Procedure: [name] DILATION;  Surgeon: [name], MD;  [facility];  Service: Endoscopy;  Laterality: N/A;  . ROBOT ASSISTED LAPAROSCOPIC NEPHRECTOMY Right 10/14/2013   Procedure: ROBOTIC ASSISTED LAPAROSCOPIC RADICAL NEPHRECTOMY;  Surgeon: [name], MD;  Location: [facility];  Service: Urology;  Laterality: Right;  . SAVORY DILATION N/A 07/08/2013   Procedure: SAVORY DILATION;  Surgeon: [name], MD;  [facility];  Service: Endoscopy;  Laterality: N/A;  . TONSILLECTOMY    . Wart  removal      Current Outpatient Prescriptions  Medication Sig Dispense Refill  . amiodarone (PACERONE) 100 MG tablet TAKE ONE TABLET BY MOUTH ONCE DAILY - 90 tablet 2  . diphenhydrAMINE (BENADRYL) 25 MG tablet Take 2 tablets (50 mg total) by mouth at bedtime as needed for sleep. 20 tablet 0  . gabapentin (NEURONTIN) 300 MG capsule Take 1 capsule (300 mg total) by mouth 2 (two) times daily. 20 capsule 0  . insulin NPH Human (HUMULIN N,NOVOLIN N) 100 UNIT/ML injection Inject 3 Units into the skin  3 (three) times daily before meals.     Marland Kitchen lisinopril-hydrochlorothiazide (PRINZIDE,ZESTORETIC) 20-12.5 MG per tablet Take 1 tablet by mouth every morning.    . Melatonin 1 MG CAPS Take 1 capsule (1 mg total) by mouth at bedtime as needed (sleep). 30 capsule 0  . metoprolol succinate (TOPROL-XL) 50 MG 24 hr tablet Take 50 mg by mouth daily.    . hydrocortisone (ANUSOL-HC) 2.5 % rectal cream Place 1 application rectally 2 (two) times daily. (Patient not taking: Reported on 03/06/2016) 30 g 1  . metoprolol tartrate (LOPRESSOR) 25 MG tablet Take 1 tablet (25 mg total) by mouth 2 (two) times daily. (Patient not taking: Reported on 03/06/2016) 60 tablet 0   No current facility-administered medications for this visit.     Allergies as of 03/06/2016 - Review Complete 03/06/2016  Allergen Reaction Noted  . Sulfa antibiotics Itching and Other (See Comments) 04/10/2012    Family History  Problem Relation Age of Onset  . Colon cancer Sister 63  . Cancer Mother   . Heart attack Father   . Hypertension Daughter   . Cancer Maternal Grandfather   . Diabetes Paternal Grandmother     Social History   Social History  . Marital status: Single    Spouse name: N/A  . Number of children: N/A  . Years of education: N/A   Social History Main Topics  . Smoking status: Current Every Day Smoker    Packs/day: 0.10    Years: 40.00    Types: Cigarettes  . Smokeless tobacco: Never Used  . Alcohol use 0.0 oz/week     Comment: beer once or twice per month, 24-40 ounce.   . Drug use: Yes    Types: Marijuana, Cocaine     Comment: today, cocaine  . Sexual activity: Not Currently    Birth control/ protection: Surgical     Comment: hyst   Other Topics Concern  . None   Social History Narrative  . None    Review of Systems: Gen: Denies fever, chills, anorexia. Denies fatigue, weakness, weight loss.  CV: Denies chest pain, palpitations, syncope, peripheral edema, and claudication. Resp: Denies dyspnea  at rest, cough, wheezing, coughing up blood, and pleurisy. GI: see HPI  Derm: Denies rash, itching, dry skin Psych: Denies depression, anxiety, memory loss, confusion. No homicidal or suicidal ideation.  Heme: Denies bruising, bleeding, and enlarged lymph nodes.  Physical Exam: BP (!) 223/128   Pulse 62   Temp 97.7 F (36.5 C) (Oral)   Ht 5' (1.524 m)   Wt 149 lb 6.4 oz (67.8 kg)   BMI 29.18 kg/m  General:   Alert and oriented. No distress noted. Pleasant and cooperative.  Head:  Normocephalic and atraumatic. Eyes:  Conjuctiva clear without scleral icterus. Mouth:  Oral mucosa pink and moist. Poor dentition.  Heart:  S1, S2 present without murmurs Abdomen:  +BS, soft, non-tender and non-distended. No rebound or guarding. No  HSM or masses noted. Msk:  Symmetrical without gross deformities. Normal posture. Extremities:  Without edema. Neurologic:  Alert and  oriented x4;  grossly normal neurologically. Psych:  Alert and cooperative. Normal mood and affect.

## 2016-03-06 NOTE — Assessment & Plan Note (Signed)
66 year old female with HCV/ETOH cirrhosis. No recent ETOH use although poor historian. Admits to still using illicit substances (cocaine) but states this has significantly decreased. Denying assistance for rehab. US abdomen due in June 2018. Due for screening EGD this year as well. BP markedly elevated at time of visit today (SBP > 200, DBP > 100). Asymptomatic. I have recommended ED evaluation but she is declining this, and she is not willing to see PCP today either. We have arranged close interval follow-up with her PCP in a week's time to review BP medications. She will return here in 4 weeks for reassessment of readiness for EGD. Discussed drug cessation.   Addendum: patient is willing to go to the ED but states she is going later today. I have discussed risks of delaying care and she states understanding.

## 2016-03-06 NOTE — ED Triage Notes (Signed)
PT was at GI doctor today, she had not taken her BP medication, BP was elevated they wanted her to come to ED. Pt just now took her BP medication

## 2016-03-06 NOTE — Assessment & Plan Note (Signed)
Poor dentition, reflux noted. Start PPI. Will need EGD for variceal screening soon and may pursue dilation as appropriate at that time.

## 2016-03-07 DIAGNOSIS — I10 Essential (primary) hypertension: Secondary | ICD-10-CM | POA: Diagnosis not present

## 2016-03-07 DIAGNOSIS — E1142 Type 2 diabetes mellitus with diabetic polyneuropathy: Secondary | ICD-10-CM | POA: Diagnosis not present

## 2016-03-09 DIAGNOSIS — I10 Essential (primary) hypertension: Secondary | ICD-10-CM | POA: Diagnosis not present

## 2016-03-16 DIAGNOSIS — I10 Essential (primary) hypertension: Secondary | ICD-10-CM | POA: Diagnosis not present

## 2016-03-16 DIAGNOSIS — F419 Anxiety disorder, unspecified: Secondary | ICD-10-CM | POA: Diagnosis not present

## 2016-03-16 DIAGNOSIS — J0110 Acute frontal sinusitis, unspecified: Secondary | ICD-10-CM | POA: Diagnosis not present

## 2016-03-16 DIAGNOSIS — Z905 Acquired absence of kidney: Secondary | ICD-10-CM | POA: Diagnosis not present

## 2016-03-20 ENCOUNTER — Encounter (HOSPITAL_COMMUNITY): Payer: Self-pay | Admitting: Emergency Medicine

## 2016-03-20 ENCOUNTER — Emergency Department (HOSPITAL_COMMUNITY)
Admission: EM | Admit: 2016-03-20 | Discharge: 2016-03-20 | Disposition: A | Discharge status: Home / Self Care | Payer: Medicare Other | Attending: Emergency Medicine | Admitting: Emergency Medicine

## 2016-03-20 DIAGNOSIS — F1490 Cocaine use, unspecified, uncomplicated: Secondary | ICD-10-CM | POA: Insufficient documentation

## 2016-03-20 DIAGNOSIS — I10 Essential (primary) hypertension: Secondary | ICD-10-CM | POA: Diagnosis not present

## 2016-03-20 DIAGNOSIS — R51 Headache: Secondary | ICD-10-CM | POA: Diagnosis present

## 2016-03-20 DIAGNOSIS — J45909 Unspecified asthma, uncomplicated: Secondary | ICD-10-CM | POA: Diagnosis not present

## 2016-03-20 DIAGNOSIS — F17210 Nicotine dependence, cigarettes, uncomplicated: Secondary | ICD-10-CM | POA: Diagnosis not present

## 2016-03-20 DIAGNOSIS — Z79899 Other long term (current) drug therapy: Secondary | ICD-10-CM | POA: Diagnosis not present

## 2016-03-20 DIAGNOSIS — E119 Type 2 diabetes mellitus without complications: Secondary | ICD-10-CM | POA: Diagnosis not present

## 2016-03-20 DIAGNOSIS — Z794 Long term (current) use of insulin: Secondary | ICD-10-CM | POA: Diagnosis not present

## 2016-03-20 DIAGNOSIS — F1290 Cannabis use, unspecified, uncomplicated: Secondary | ICD-10-CM | POA: Insufficient documentation

## 2016-03-20 DIAGNOSIS — Z85528 Personal history of other malignant neoplasm of kidney: Secondary | ICD-10-CM | POA: Insufficient documentation

## 2016-03-20 DIAGNOSIS — E1142 Type 2 diabetes mellitus with diabetic polyneuropathy: Secondary | ICD-10-CM | POA: Diagnosis not present

## 2016-03-20 HISTORY — DX: Cocaine abuse, uncomplicated: F14.10

## 2016-03-20 MED ORDER — HYDROCODONE-ACETAMINOPHEN 5-325 MG PO TABS
1.0000 | ORAL_TABLET | Freq: Once | ORAL | Status: AC
  Administered 2016-03-20: 1 via ORAL
  Filled 2016-03-20: qty 1

## 2016-03-20 MED ORDER — CLONIDINE HCL 0.1 MG PO TABS: 0.2000 mg | ORAL_TABLET | Freq: Two times a day (BID) | ORAL | 0 refills | Status: DC

## 2016-03-20 NOTE — ED Triage Notes (Signed)
Sent here from pcp high bp,  Given 2 bp pills before leaving her dr office.

## 2016-03-20 NOTE — Discharge Instructions (Signed)
Follow up with your md next week. °

## 2016-03-20 NOTE — ED Provider Notes (Signed)
[facility] DEPT Provider Note   CSN: [id number] Arrival date & time: 03/20/16  1511   By signing my name below, I, Hilbert Odor, attest that this documentation has been prepared under the direction and in the presence of Milton Ferguson, MD. Electronically Signed: Hilbert Odor, Scribe. 03/20/16. 8:16 PM. History   Chief Complaint Chief Complaint  Patient presents with  . Hypertension   HPI Comments: Cassandra Jordan is a 66 y.o. female who presents to the Emergency Department complaining of hypertension for the past few days. She states that she was recently seen by her PCP a few days ago for the same issue and was advised to come back to see her PCP after 3 days if her blood pressure hasn't resolved. One of her medications was changed at this time. She is currently taking hydrochlorthiazide and lisinopril. She went to see her PCP today and was advised to come to the ED for further evaluation because her BP hasn't resolved yet.. The patient currently reports a headache. She states that she ate something while in the waiting room here but nothing else throughout today.  The history is provided by the patient. No language interpreter was used.  Hypertension  This is a new problem. The current episode started more than 2 days ago. The problem occurs constantly. The problem has not changed since onset.Associated symptoms include headaches. Pertinent negatives include no chest pain and no abdominal pain. Nothing relieves the symptoms.    Past Medical History:  Diagnosis Date  . Arthritis   . Asthma   . Cocaine abuse   . Essential hypertension   . GERD (gastroesophageal reflux disease)   . Hepatitis C   . Hyperlipidemia   . Lichen sclerosus et atrophicus 10/13/2014  . Neuropathic pain   . PSVT (paroxysmal supraventricular tachycardia) ([place])   . Rash   . Renal carcinoma ([place])   . Type 2 diabetes mellitus ([place])   . Vaginal irritation 10/01/2014  . Vaginal itching 10/01/2014  .  Yeast infection of the vagina 10/01/2014    Patient Active Problem List   Diagnosis Date Noted  . Fecal incontinence 01/04/2016  . Hepatic cirrhosis ([place]) 04/08/2015  . History of adenomatous polyp of colon   . History of colonic polyps   . Hx of adenomatous colonic polyps 12/10/2014  . Lichen sclerosus et atrophicus 10/13/2014  . Vaginal itching 10/01/2014  . Vaginal irritation 10/01/2014  . Yeast infection of the vagina 10/01/2014  . Blurred vision, right eye 03/20/2014  . UTI (lower urinary tract infection) 03/20/2014  . Cocaine abuse 03/20/2014  . Generalized weakness 03/20/2014  . Chest pain 02/10/2014  . Chest pain at rest 02/10/2014  . Type 2 diabetes mellitus ([place])   . Diabetes mellitus, type II ([place])   . Tobacco abuse   . PSVT (paroxysmal supraventricular tachycardia) ([place]) 11/04/2013  . Essential hypertension 10/16/2013  . Renal mass 10/14/2013  . Ovarian mass, left 08/17/2013  . Hepatitis C, chronic ([place]) 07/01/2013  . Elevated LFTs 07/01/2013  . Other dysphagia 07/01/2013  . Encounter for screening colonoscopy 07/01/2013    Past Surgical History:  Procedure Laterality Date  . ABDOMINAL HYSTERECTOMY    . COLONOSCOPY N/A 07/08/2013   RMR: Multiple colonic polyps treated [name] as described above. Inadequate prepartation comprimised examination there colonic diverticulosis. tubular and tubulovillous adenomas. next TCS 12/2013  . COLONOSCOPY N/A 02/09/2015   RMR: Colonic polyp removed as described above. Tubular adenoma. Next colonoscopy January 2022.  . ESOPHAGOGASTRODUODENOSCOPY N/A 07/08/2013  RMR: Schatzki's ring ; small hiatal hernia-status post passage of a Maloney dilator  . MALONEY DILATION N/A 07/08/2013   Procedure: [name] DILATION;  Surgeon: [name], MD;  [facility];  Service: Endoscopy;  Laterality: N/A;  . ROBOT ASSISTED LAPAROSCOPIC NEPHRECTOMY Right 10/14/2013   Procedure: ROBOTIC ASSISTED LAPAROSCOPIC RADICAL NEPHRECTOMY;  Surgeon:  [name], MD;  Location: [facility];  Service: Urology;  Laterality: Right;  . SAVORY DILATION N/A 07/08/2013   Procedure: SAVORY DILATION;  Surgeon: [name], MD;  Location: [facility];  Service: Endoscopy;  Laterality: N/A;  . TONSILLECTOMY    . Wart removal      OB History    Gravida Para Term Preterm AB Living   1 1       1    SAB TAB Ectopic Multiple Live Births                   Home Medications    Prior to Admission medications   Medication Sig Start Date End Date Taking? Authorizing Provider  amiodarone (PACERONE) 100 MG tablet TAKE ONE TABLET BY MOUTH ONCE DAILY - 11/16/15   Satira Sark, MD  diphenhydrAMINE (BENADRYL) 25 MG tablet Take 2 tablets (50 mg total) by mouth at bedtime as needed for sleep. 01/27/16   Merrily Pew, MD  gabapentin (NEURONTIN) 300 MG capsule Take 1 capsule (300 mg total) by mouth 2 (two) times daily. 11/06/14   Francine Graven, DO  hydrochlorothiazide (HYDRODIURIL) 12.5 MG tablet Take 1 tablet by mouth daily. 03/02/16   Historical Provider, MD  hydrocortisone (ANUSOL-HC) 2.5 % rectal cream Place 1 application rectally 2 (two) times daily. Patient not taking: Reported on 03/06/2016 01/04/16   Annitta Needs, NP  insulin NPH Human (HUMULIN N,NOVOLIN N) 100 UNIT/ML injection Inject 3 Units into the skin 3 (three) times daily before meals.     Historical Provider, MD  lisinopril-hydrochlorothiazide (PRINZIDE,ZESTORETIC) 20-25 MG tablet Take 1 tablet by mouth daily.    Historical Provider, MD  Melatonin 1 MG CAPS Take 1 capsule (1 mg total) by mouth at bedtime as needed (sleep). 01/27/16   Merrily Pew, MD  metoprolol succinate (TOPROL-XL) 50 MG 24 hr tablet Take 50 mg by mouth daily. 03/02/16   Historical Provider, MD  pantoprazole (PROTONIX) 40 MG tablet Take 1 tablet (40 mg total) by mouth daily. Take 30 minutes before breakfast 03/06/16   Annitta Needs, NP    Family History Family History  Problem Relation Age of Onset  . Colon cancer Sister 53    . Cancer Mother   . Heart attack Father   . Hypertension Daughter   . Cancer Maternal Grandfather   . Diabetes Paternal Grandmother     Social History Social History  Substance Use Topics  . Smoking status: Current Every Day Smoker    Packs/day: 0.10    Years: 40.00    Types: Cigarettes  . Smokeless tobacco: Never Used  . Alcohol use No     Comment: rarely     Allergies   Sulfa antibiotics   Review of Systems Review of Systems  Constitutional: Negative for appetite change and fatigue.  HENT: Negative for congestion, ear discharge and sinus pressure.   Eyes: Negative for discharge.  Respiratory: Negative for cough.   Cardiovascular: Negative for chest pain.  Gastrointestinal: Negative for abdominal pain and diarrhea.  Genitourinary: Negative for frequency and hematuria.  Musculoskeletal: Negative for back pain.  Skin: Negative for rash.  Neurological:  Positive for headaches. Negative for seizures.  Psychiatric/Behavioral: Negative for hallucinations.  All other systems reviewed and are negative.    Physical Exam Updated Vital Signs BP 174/93 (BP Location: Left Arm)   Pulse (!) 56   Temp 98.4 F (36.9 C) (Oral)   Resp 18   Ht 5' (1.524 m)   Wt 145 lb (65.8 kg)   SpO2 95%   BMI 28.32 kg/m   Physical Exam  Constitutional: She is oriented to person, place, and time. She appears well-developed.  HENT:  Head: Normocephalic.  Eyes: Conjunctivae and EOM are normal. No scleral icterus.  Neck: Neck supple. No thyromegaly present.  Cardiovascular: Normal rate and regular rhythm.  Exam reveals no gallop and no friction rub.   No murmur heard. Pulmonary/Chest: No stridor. She has no wheezes. She has no rales. She exhibits no tenderness.  Abdominal: She exhibits no distension. There is no tenderness. There is no rebound.  Musculoskeletal: Normal range of motion. She exhibits no edema.  Lymphadenopathy:    She has no cervical adenopathy.  Neurological: She is  oriented to person, place, and time. She exhibits normal muscle tone. Coordination normal.  Skin: No rash noted. No erythema.  Psychiatric: She has a normal mood and affect. Her behavior is normal.     ED Treatments / Results  DIAGNOSTIC STUDIES: Oxygen Saturation is 95% on RA, adequate by my interpretation.    COORDINATION OF CARE: 8:04 PM Discussed treatment plan with pt at bedside and pt agreed to plan. I will check the patient's labs.  Labs (all labs ordered are listed, but only abnormal results are displayed) Labs Reviewed - No data to display  EKG  EKG Interpretation None       Radiology No results found.  Procedures Procedures (including critical care time)  Medications Ordered in ED Medications  HYDROcodone-acetaminophen (NORCO/VICODIN) 5-325 MG per tablet 1 tablet (not administered)     Initial Impression / Assessment and Plan / ED Course  I have reviewed the triage vital signs and the nursing notes.  Pertinent labs & imaging results that were available during my care of the patient were reviewed by me and considered in my medical decision making (see chart for details).     Patient with poorly controlled blood pressure. We will add clonidine 0.1 mg twice a day to her normal dose of medicines and have her follow-up with her PCP  Final Clinical Impressions(s) / ED Diagnoses   Final diagnoses:  None    New Prescriptions New Prescriptions   No medications on file   The chart was scribed for me under my direct supervision.  I personally performed the history, physical, and medical decision making and all procedures in the evaluation of this patient.Milton Ferguson, MD 03/20/16 2049

## 2016-03-20 NOTE — ED Notes (Signed)
Called to triage x 1with no answer

## 2016-04-04 ENCOUNTER — Telehealth: Payer: Self-pay | Admitting: Gastroenterology

## 2016-04-04 ENCOUNTER — Ambulatory Visit: Payer: Medicare Other | Admitting: Gastroenterology

## 2016-04-04 ENCOUNTER — Encounter: Payer: Self-pay | Admitting: Gastroenterology

## 2016-04-04 NOTE — Telephone Encounter (Signed)
PATIENT WAS A NO SHOW AND LETTER SENT  °

## 2016-05-04 ENCOUNTER — Telehealth: Payer: Self-pay | Admitting: Internal Medicine

## 2016-05-04 NOTE — Telephone Encounter (Signed)
Patient daughter would like you to call her about paperwork her mother was supposed to fill out, they are not sure what it was

## 2016-05-08 NOTE — Telephone Encounter (Signed)
The only thing I can think of is some type of patient assistance. I don't see anything in my plan about it. I'm sorry, I don't remember.

## 2016-05-08 NOTE — Telephone Encounter (Signed)
I have checked the pts chart and office visit notes. There is no mention of any paperwork that needs to be filled out. I spoke with Cassandra Jordan- pts daughter and informed her. She said the pt told her that Cassandra Jordan discussed it with her at her office visit but has no idea what the papers were for. Routing to Cassandra Males, do you remember discussing any paperwork that needed to be filled out for this pt?

## 2016-05-19 ENCOUNTER — Encounter (HOSPITAL_COMMUNITY): Payer: Self-pay

## 2016-05-19 ENCOUNTER — Emergency Department (HOSPITAL_COMMUNITY)
Admission: EM | Admit: 2016-05-19 | Discharge: 2016-05-19 | Disposition: A | Discharge status: Home / Self Care | Payer: Medicare Other | Attending: Emergency Medicine | Admitting: Emergency Medicine

## 2016-05-19 DIAGNOSIS — J309 Allergic rhinitis, unspecified: Secondary | ICD-10-CM

## 2016-05-19 DIAGNOSIS — K0889 Other specified disorders of teeth and supporting structures: Secondary | ICD-10-CM | POA: Diagnosis present

## 2016-05-19 DIAGNOSIS — I10 Essential (primary) hypertension: Secondary | ICD-10-CM | POA: Diagnosis not present

## 2016-05-19 DIAGNOSIS — E119 Type 2 diabetes mellitus without complications: Secondary | ICD-10-CM | POA: Insufficient documentation

## 2016-05-19 DIAGNOSIS — F17210 Nicotine dependence, cigarettes, uncomplicated: Secondary | ICD-10-CM | POA: Diagnosis not present

## 2016-05-19 DIAGNOSIS — K054 Periodontosis: Secondary | ICD-10-CM | POA: Insufficient documentation

## 2016-05-19 MED ORDER — LORATADINE 10 MG PO TABS
10.0000 mg | ORAL_TABLET | Freq: Every day | ORAL | Status: DC
  Administered 2016-05-19: 10 mg via ORAL
  Filled 2016-05-19: qty 1

## 2016-05-19 MED ORDER — LORATADINE 10 MG PO TABS: 10.0000 mg | ORAL_TABLET | Freq: Every day | ORAL | 0 refills | Status: DC

## 2016-05-19 MED ORDER — PENICILLIN V POTASSIUM 500 MG PO TABS: 500.0000 mg | ORAL_TABLET | Freq: Four times a day (QID) | ORAL | 0 refills | Status: DC

## 2016-05-19 NOTE — ED Notes (Signed)
Dr Delane Ginger informed of pt BP and that she has not taken her HTN meds- Pt has home meds with her and he suggested that she take her meds- pt took home meds and was discharged

## 2016-05-19 NOTE — ED Provider Notes (Signed)
[facility] DEPT Provider Note   CSN: [id number] Arrival date & time: 05/19/16  1310  By signing my name below, I, [name], attest that this documentation has been prepared under the direction and in the presence of Varney Biles, MD. Electronically Signed: [name], ED Scribe. 05/19/16. 1:49 PM.  History   Chief Complaint Chief Complaint  Patient presents with  . URI  . Dental Pain    HPI Cassandra Jordan is a 66 y.o. female with a PMHx of DM, who presents to the Emergency Department complaining of a constant, worsening toothache that started a few days ago. No fevers, chills. Pain is not associated with temperature change.   Additionally pt c/o rhinorrhea (clear liquid) and sore throat that started last night (05/18/16). Pt has not tried any treatments for relief.  She is currently not seeing a dentist. Pt denies fever, HA or any other sx at this time.  The history is provided by the patient. No language interpreter was used.    Past Medical History:  Diagnosis Date  . Arthritis   . Asthma   . Cocaine abuse   . Essential hypertension   . GERD (gastroesophageal reflux disease)   . Hepatitis C   . Hyperlipidemia   . Lichen sclerosus et atrophicus 10/13/2014  . Neuropathic pain   . PSVT (paroxysmal supraventricular tachycardia) ([place])   . Rash   . Renal carcinoma ([place])   . Type 2 diabetes mellitus ([place])   . Vaginal irritation 10/01/2014  . Vaginal itching 10/01/2014  . Yeast infection of the vagina 10/01/2014    Patient Active Problem List   Diagnosis Date Noted  . Fecal incontinence 01/04/2016  . Hepatic cirrhosis ([place]) 04/08/2015  . History of adenomatous polyp of colon   . History of colonic polyps   . Hx of adenomatous colonic polyps 12/10/2014  . Lichen sclerosus et atrophicus 10/13/2014  . Vaginal itching 10/01/2014  . Vaginal irritation 10/01/2014  . Yeast infection of the vagina 10/01/2014  . Blurred vision, right eye 03/20/2014  . UTI (lower  urinary tract infection) 03/20/2014  . Cocaine abuse 03/20/2014  . Generalized weakness 03/20/2014  . Chest pain 02/10/2014  . Chest pain at rest 02/10/2014  . Type 2 diabetes mellitus ([place])   . Diabetes mellitus, type II ([place])   . Tobacco abuse   . PSVT (paroxysmal supraventricular tachycardia) ([place]) 11/04/2013  . Essential hypertension 10/16/2013  . Renal mass 10/14/2013  . Ovarian mass, left 08/17/2013  . Hepatitis C, chronic ([place]) 07/01/2013  . Elevated LFTs 07/01/2013  . Other dysphagia 07/01/2013  . Encounter for screening colonoscopy 07/01/2013    Past Surgical History:  Procedure Laterality Date  . ABDOMINAL HYSTERECTOMY    . COLONOSCOPY N/A 07/08/2013   RMR: Multiple colonic polyps treated [name] as described above. Inadequate prepartation comprimised examination there colonic diverticulosis. tubular and tubulovillous adenomas. next TCS 12/2013  . COLONOSCOPY N/A 02/09/2015   RMR: Colonic polyp removed as described above. Tubular adenoma. Next colonoscopy January 2022.  . ESOPHAGOGASTRODUODENOSCOPY N/A 07/08/2013   RMR: Schatzki's ring ; small hiatal hernia-status post passage of a Maloney dilator  . MALONEY DILATION N/A 07/08/2013   Procedure: [name] DILATION;  Surgeon: [name], MD;  [facility];  Service: Endoscopy;  Laterality: N/A;  . ROBOT ASSISTED LAPAROSCOPIC NEPHRECTOMY Right 10/14/2013   Procedure: ROBOTIC ASSISTED LAPAROSCOPIC RADICAL NEPHRECTOMY;  Surgeon: [name], MD;  Location[facility];  Service: Urology;  Laterality: Right;  . SAVORY DILATION N/A 07/08/2013  Procedure: SAVORY DILATION;  Surgeon: [name], MD;  [facility];  Service: Endoscopy;  Laterality: N/A;  . TONSILLECTOMY    . Wart removal      OB History    Gravida Para Term Preterm AB Living   1 1       1    SAB TAB Ectopic Multiple Live Births                   Home Medications    Prior to Admission medications   Medication Sig Start Date End Date  Taking? Authorizing Provider  amiodarone (PACERONE) 100 MG tablet TAKE ONE TABLET BY MOUTH ONCE DAILY - 11/16/15   Satira Sark, MD  cloNIDine (CATAPRES) 0.1 MG tablet Take 2 tablets (0.2 mg total) by mouth 2 (two) times daily. 03/20/16   Milton Ferguson, MD  diphenhydrAMINE (BENADRYL) 25 MG tablet Take 2 tablets (50 mg total) by mouth at bedtime as needed for sleep. 01/27/16   Merrily Pew, MD  gabapentin (NEURONTIN) 300 MG capsule Take 1 capsule (300 mg total) by mouth 2 (two) times daily. 11/06/14   Francine Graven, DO  hydrochlorothiazide (HYDRODIURIL) 12.5 MG tablet Take 1 tablet by mouth daily. 03/02/16   Historical Provider, MD  hydrocortisone (ANUSOL-HC) 2.5 % rectal cream Place 1 application rectally 2 (two) times daily. Patient not taking: Reported on 03/06/2016 01/04/16   Annitta Needs, NP  insulin NPH Human (HUMULIN N,NOVOLIN N) 100 UNIT/ML injection Inject 3 Units into the skin 3 (three) times daily before meals.     Historical Provider, MD  lisinopril-hydrochlorothiazide (PRINZIDE,ZESTORETIC) 20-25 MG tablet Take 1 tablet by mouth daily.    Historical Provider, MD  loratadine (CLARITIN) 10 MG tablet Take 1 tablet (10 mg total) by mouth daily. 05/19/16   Varney Biles, MD  Melatonin 1 MG CAPS Take 1 capsule (1 mg total) by mouth at bedtime as needed (sleep). 01/27/16   Merrily Pew, MD  metoprolol succinate (TOPROL-XL) 50 MG 24 hr tablet Take 50 mg by mouth daily. 03/02/16   Historical Provider, MD  pantoprazole (PROTONIX) 40 MG tablet Take 1 tablet (40 mg total) by mouth daily. Take 30 minutes before breakfast 03/06/16   Annitta Needs, NP  penicillin v potassium (VEETID) 500 MG tablet Take 1 tablet (500 mg total) by mouth 4 (four) times daily. 05/19/16   Varney Biles, MD    Family History Family History  Problem Relation Age of Onset  . Colon cancer Sister 22  . Cancer Mother   . Heart attack Father   . Hypertension Daughter   . Cancer Maternal Grandfather   . Diabetes Paternal  Grandmother     Social History Social History  Substance Use Topics  . Smoking status: Current Every Day Smoker    Packs/day: 0.10    Years: 40.00    Types: Cigarettes  . Smokeless tobacco: Never Used  . Alcohol use No     Comment: rarely     Allergies   Sulfa antibiotics   Review of Systems Review of Systems  Constitutional: Negative for fever.  HENT: Positive for dental problem, rhinorrhea and sore throat.   Neurological: Negative for headaches.  All other systems reviewed and are negative.    Physical Exam Updated Vital Signs BP (!) 190/109 (BP Location: Right Arm)   Pulse 73   Temp 98.7 F (37.1 C) (Oral) Comment (Src): Simultaneous filing. User may not have seen previous data.  Resp 18   Ht 5' (  1.524 m)   Wt 140 lb (63.5 kg)   SpO2 97%   BMI 27.34 kg/m   Physical Exam  Constitutional: She appears well-developed and well-nourished. No distress.  HENT:  Head: Normocephalic and atraumatic.  Patient has multiple teeth that have avulsed and all the teeth remaining have erosion and decay. Over the tooth 1 and 31, 32 pt has tenderness. There is no fluctuance. No trismus, no signs of ludwigs angina.  Eyes: Conjunctivae are normal.  Neck: Normal range of motion.  Cardiovascular: Normal rate.   Pulmonary/Chest: Effort normal. No stridor.  Abdominal: She exhibits no distension.  Musculoskeletal: Normal range of motion.  Lymphadenopathy:    She has no cervical adenopathy.  Neurological: She is alert.  Skin: No pallor.  Psychiatric: She has a normal mood and affect. Her behavior is normal.  Nursing note and vitals reviewed.    ED Treatments / Results  DIAGNOSTIC STUDIES: Oxygen Saturation is 97% on RA, normal by my interpretation.   COORDINATION OF CARE: 1:45 PM-Discussed next steps with pt. Pt verbalized understanding and is agreeable with the plan.    Labs (all labs ordered are listed, but only abnormal results are displayed) Labs Reviewed - No  data to display  EKG  EKG Interpretation None       Radiology No results found.  Procedures Procedures (including critical care time)  Medications Ordered in ED Medications  loratadine (CLARITIN) tablet 10 mg (10 mg Oral Given 05/19/16 1352)     Initial Impression / Assessment and Plan / ED Course  I have reviewed the triage vital signs and the nursing notes.  Pertinent labs & imaging results that were available during my care of the patient were reviewed by me and considered in my medical decision making (see chart for details).  Clinical Course as of May 20 1403  Sat May 19, 2016  1403 Pt's BP is high - but she has no chest pain, dib, headaches, vision complains. Pt admits to not taking her meds this morning, and also reports occasional use of cocaine. Pt advised to take her BP meds as soon as she gets home, and she concurs.  [AN]    Clinical Course User Index [AN] Varney Biles, MD    I personally performed the services described in this documentation, which was scribed in my presence. The recorded information has been reviewed and is accurate.  Pt comes in with cc of tooth pain.  DDx includes: - Periapical tooth infection - Dental abscess - Gingivitis - Dental trauma - Pulpitis - Nerve root compression  Pt also has runny nose. She does use cocaine - reportedly crack, but if she is snorting cocaine, this could be cocaine related rhinorrhea or simply allergic rhinitis.    Final Clinical Impressions(s) / ED Diagnoses   Final diagnoses:  Periodontosis  Allergic rhinitis, unspecified seasonality, unspecified trigger    New Prescriptions New Prescriptions   LORATADINE (CLARITIN) 10 MG TABLET    Take 1 tablet (10 mg total) by mouth daily.   PENICILLIN V POTASSIUM (VEETID) 500 MG TABLET    Take 1 tablet (500 mg total) by mouth 4 (four) times daily.        Varney Biles, MD 05/19/16 1406

## 2016-05-19 NOTE — ED Notes (Signed)
Pt reports that she has seasonal allergies and is followed by Dr Karie Kirks. She also uses marijuana as well as cocaine. Last use yesterday of marijuana-  She states she has a cough and chills as well as bad teeth- She has many missing teeth as well as multiple caries -

## 2016-05-19 NOTE — Discharge Instructions (Signed)
Please call the Dentist listed immediately for a close follow up. Start  taking the meds prescribed.

## 2016-05-19 NOTE — ED Triage Notes (Signed)
Pt reports productive cough with white sputum, nasal congestion, and toothache since this morning.

## 2016-05-30 ENCOUNTER — Encounter: Payer: Self-pay | Admitting: Gastroenterology

## 2016-05-30 ENCOUNTER — Ambulatory Visit (INDEPENDENT_AMBULATORY_CARE_PROVIDER_SITE_OTHER): Payer: Medicare Other | Admitting: Gastroenterology

## 2016-05-30 VITALS — BP 192/95 | HR 65 | Temp 97.1°F | Ht 60.0 in | Wt 144.8 lb

## 2016-05-30 DIAGNOSIS — K7460 Unspecified cirrhosis of liver: Secondary | ICD-10-CM

## 2016-05-30 DIAGNOSIS — K5900 Constipation, unspecified: Secondary | ICD-10-CM | POA: Insufficient documentation

## 2016-05-30 LAB — COMPLETE METABOLIC PANEL WITH GFR
ALT: 33 U/L — AB (ref 6–29)
AST: 40 U/L — AB (ref 10–35)
Albumin: 3.5 g/dL — ABNORMAL LOW (ref 3.6–5.1)
Alkaline Phosphatase: 60 U/L (ref 33–130)
BUN: 19 mg/dL (ref 7–25)
CO2: 27 mmol/L (ref 20–31)
CREATININE: 1.14 mg/dL — AB (ref 0.50–0.99)
Calcium: 9 mg/dL (ref 8.6–10.4)
Chloride: 97 mmol/L — ABNORMAL LOW (ref 98–110)
GFR, EST AFRICAN AMERICAN: 58 mL/min — AB (ref >=60)
GFR, Est Non African American: 51 mL/min — ABNORMAL LOW (ref >=60)
GLUCOSE: 137 mg/dL — AB (ref 65–99)
Potassium: 3.6 mmol/L (ref 3.5–5.3)
SODIUM: 137 mmol/L (ref 135–146)
TOTAL PROTEIN: 8 g/dL (ref 6.1–8.1)
Total Bilirubin: 0.3 mg/dL (ref 0.2–1.2)

## 2016-05-30 LAB — CBC WITH DIFFERENTIAL/PLATELET
BASOS PCT: 0 %
Basophils Absolute: 0 cells/uL (ref 0–200)
Eosinophils Absolute: 194 cells/uL (ref 15–500)
Eosinophils Relative: 2 %
HCT: 49.8 % — ABNORMAL HIGH (ref 35.0–45.0)
Hemoglobin: 16.5 g/dL — ABNORMAL HIGH (ref 11.7–15.5)
LYMPHS ABS: 5335 {cells}/uL — AB (ref 850–3900)
Lymphocytes Relative: 55 %
MCH: 29.5 pg (ref 27.0–33.0)
MCHC: 33.1 g/dL (ref 32.0–36.0)
MCV: 88.9 fL (ref 80.0–100.0)
MONO ABS: 582 {cells}/uL (ref 200–950)
MONOS PCT: 6 %
MPV: 10.9 fL (ref 7.5–12.5)
Neutro Abs: 3589 cells/uL (ref 1500–7800)
Neutrophils Relative %: 37 %
PLATELETS: 263 10*3/uL (ref 140–400)
RBC: 5.6 MIL/uL — AB (ref 3.80–5.10)
RDW: 13.4 % (ref 11.0–15.0)
WBC: 9.7 10*3/uL (ref 3.8–10.8)

## 2016-05-30 LAB — PROTIME-INR
INR: 1
Prothrombin Time: 10.4 s (ref 9.0–11.5)

## 2016-05-30 NOTE — Patient Instructions (Signed)
We are looking for a facility that can treat you.  Please have blood work done today.   We will do the ultrasound as well in the near future.  For constipation: start taking Linzess 1 capsule each morning 30 minutes before breakfast. Let me know how it does for you.   We will see you in 6 weeks.

## 2016-05-30 NOTE — Assessment & Plan Note (Signed)
66 year old female with HCV/ETOH cirrhosis. No recent ETOH use but continues to use illicit substances, most recently yesterday (cocaine). Now willing to accept rehab assistance. Not a candidate for elective EGD for variceal screening with cocaine on board. Not a candidate for Hep C treatment while still using illicit substances. I was frank with her regarding importance of treatment and long-term complications from liver disease. Thankfully, she is willing to pursue rehab at this point.   US abdomen for [facility] screening Routine labs today Referral for rehab if possible Return in 6 weeks to assess readiness for elective EGD

## 2016-05-30 NOTE — Assessment & Plan Note (Signed)
Start Linzess 72 mcg once each morning, 30 minutes before breakfast. Samples provided.

## 2016-05-30 NOTE — Progress Notes (Signed)
cc'ed to pcp °

## 2016-05-30 NOTE — Progress Notes (Signed)
Referring Provider: Lemmie Evens, MD Primary Care Physician:  Lemmie Evens, MD Primary GI: Dr. Gala Romney   Chief Complaint  Patient presents with  . Cirrhosis    f/u, doing ok    HPI:   Cassandra Jordan is a 66 y.o. female presenting today with a history of ETOH/HCV cirrhosis, non-compliant with ETOH and drug abstinence historically. Needs Hep C treatment (genotype 1b) but not a candidate while still using illicit drugs. Due for screening EGD 2018. US abdomen up-to-date and due again in June 2018. Has one more Hep B shot left in the series.   Dealing with constipation. Doesn't go every day. Having to strain about once a week. No rectal bleeding. No abdominal pain. No confusion. Didn't take BP medication today as she hasn't eaten yet.   No ETOH. States "I don't know the last time". Still using cocaine. Last used yesterday. States she is willing to do rehab. [facility] in [place] declined referral, as this is out of their scope.   Past Medical History:  Diagnosis Date  . Arthritis   . Asthma   . Cocaine abuse   . Essential hypertension   . GERD (gastroesophageal reflux disease)   . Hepatitis C   . Hyperlipidemia   . Lichen sclerosus et atrophicus 10/13/2014  . Neuropathic pain   . PSVT (paroxysmal supraventricular tachycardia) ([place])   . Rash   . Renal carcinoma ([place])   . Type 2 diabetes mellitus ([place])   . Vaginal irritation 10/01/2014  . Vaginal itching 10/01/2014  . Yeast infection of the vagina 10/01/2014    Past Surgical History:  Procedure Laterality Date  . ABDOMINAL HYSTERECTOMY    . COLONOSCOPY N/A 07/08/2013   RMR: Multiple colonic polyps treated [name] as described above. Inadequate prepartation comprimised examination there colonic diverticulosis. tubular and tubulovillous adenomas. next TCS 12/2013  . COLONOSCOPY N/A 02/09/2015   RMR: Colonic polyp removed as described above. Tubular adenoma. Next colonoscopy January 2022.  . ESOPHAGOGASTRODUODENOSCOPY  N/A 07/08/2013   RMR: Schatzki's ring ; small hiatal hernia-status post passage of a Maloney dilator  . MALONEY DILATION N/A 07/08/2013   Procedure: [name] DILATION;  Surgeon: [name], MD;  [facility];  Service: Endoscopy;  Laterality: N/A;  . ROBOT ASSISTED LAPAROSCOPIC NEPHRECTOMY Right 10/14/2013   Procedure: ROBOTIC ASSISTED LAPAROSCOPIC RADICAL NEPHRECTOMY;  Surgeon: [name], MD;  Location: [facility];  Service: Urology;  Laterality: Right;  . SAVORY DILATION N/A 07/08/2013   Procedure: SAVORY DILATION;  Surgeon: [name], MD;  [facility]: [facility];  Service: Endoscopy;  Laterality: N/A;  . TONSILLECTOMY    . Wart removal      Current Outpatient Prescriptions  Medication Sig Dispense Refill  . amiodarone (PACERONE) 100 MG tablet TAKE ONE TABLET BY MOUTH ONCE DAILY - 90 tablet 2  . cloNIDine (CATAPRES) 0.1 MG tablet Take 2 tablets (0.2 mg total) by mouth 2 (two) times daily. 30 tablet 0  . diphenhydrAMINE (BENADRYL) 25 MG tablet Take 2 tablets (50 mg total) by mouth at bedtime as needed for sleep. 20 tablet 0  . gabapentin (NEURONTIN) 300 MG capsule Take 1 capsule (300 mg total) by mouth 2 (two) times daily. 20 capsule 0  . hydrochlorothiazide (HYDRODIURIL) 12.5 MG tablet Take 1 tablet by mouth daily.    . insulin NPH Human (HUMULIN N,NOVOLIN N) 100 UNIT/ML injection Inject 3 Units into the skin 3 (three) times daily before meals.     Marland Kitchen lisinopril-hydrochlorothiazide (PRINZIDE,ZESTORETIC) 20-25  MG tablet Take 1 tablet by mouth daily.    Marland Kitchen loratadine (CLARITIN) 10 MG tablet Take 1 tablet (10 mg total) by mouth daily. 30 tablet 0  . Melatonin 1 MG CAPS Take 1 capsule (1 mg total) by mouth at bedtime as needed (sleep). 30 capsule 0  . metoprolol succinate (TOPROL-XL) 50 MG 24 hr tablet Take 50 mg by mouth daily.    . pantoprazole (PROTONIX) 40 MG tablet Take 1 tablet (40 mg total) by mouth daily. Take 30 minutes before breakfast 90 tablet 3  . hydrocortisone  (ANUSOL-HC) 2.5 % rectal cream Place 1 application rectally 2 (two) times daily. (Patient not taking: Reported on 03/06/2016) 30 g 1  . penicillin v potassium (VEETID) 500 MG tablet Take 1 tablet (500 mg total) by mouth 4 (four) times daily. (Patient not taking: Reported on 05/30/2016) 28 tablet 0   No current facility-administered medications for this visit.     Allergies as of 05/30/2016 - Review Complete 05/30/2016  Allergen Reaction Noted  . Sulfa antibiotics Itching and Other (See Comments) 04/10/2012    Family History  Problem Relation Age of Onset  . Colon cancer Sister 95  . Cancer Mother   . Heart attack Father   . Hypertension Daughter   . Cancer Maternal Grandfather   . Diabetes Paternal Grandmother     Social History   Social History  . Marital status: Single    Spouse name: N/A  . Number of children: N/A  . Years of education: N/A   Social History Main Topics  . Smoking status: Current Every Day Smoker    Packs/day: 0.10    Years: 40.00    Types: Cigarettes  . Smokeless tobacco: Never Used  . Alcohol use No     Comment: rarely  . Drug use: Yes    Types: Marijuana, Cocaine     Comment: cocaine only-05/30/16  . Sexual activity: Not Currently    Birth control/ protection: Surgical     Comment: hyst   Other Topics Concern  . None   Social History Narrative  . None    Review of Systems: As mentioned in HPI   Physical Exam: BP (!) 192/95   Pulse 65   Temp 97.1 F (36.2 C) (Oral)   Ht 5' (1.524 m)   Wt 144 lb 12.8 oz (65.7 kg)   BMI 28.28 kg/m  General:   Alert and oriented. No distress noted. Pleasant and cooperative.  Head:  Normocephalic and atraumatic. Eyes:  Conjuctiva clear without scleral icterus. Abdomen:  +BS, soft, non-tender and non-distended. No rebound or guarding. No HSM or masses noted. Msk:  Symmetrical without gross deformities. Normal posture. Extremities:  Without edema. Neurologic:  Alert and  oriented x4;  grossly normal  neurologically. Psych:  Alert and cooperative. Normal mood and affect.

## 2016-06-04 ENCOUNTER — Ambulatory Visit (HOSPITAL_COMMUNITY): Payer: Medicare Other

## 2016-06-04 NOTE — Progress Notes (Signed)
Creatinine is slightly up at 1.14 but similar to her overall baseline. Make sure drinking plenty of fluids. Her AST/ALT are mildly elevated but better overall from the past. MELD 10.

## 2016-06-06 ENCOUNTER — Ambulatory Visit (HOSPITAL_COMMUNITY): Admission: RE | Admit: 2016-06-06 | Payer: Medicare Other | Source: Ambulatory Visit

## 2016-07-17 ENCOUNTER — Encounter: Payer: Self-pay | Admitting: Gastroenterology

## 2016-07-17 ENCOUNTER — Telehealth: Payer: Self-pay

## 2016-07-17 ENCOUNTER — Ambulatory Visit (INDEPENDENT_AMBULATORY_CARE_PROVIDER_SITE_OTHER): Payer: Medicare Other | Admitting: Gastroenterology

## 2016-07-17 VITALS — BP 173/99 | HR 66 | Temp 97.0°F | Ht 60.0 in | Wt 148.2 lb

## 2016-07-17 DIAGNOSIS — Z87898 Personal history of other specified conditions: Secondary | ICD-10-CM | POA: Insufficient documentation

## 2016-07-17 DIAGNOSIS — F1991 Other psychoactive substance use, unspecified, in remission: Secondary | ICD-10-CM | POA: Insufficient documentation

## 2016-07-17 DIAGNOSIS — B182 Chronic viral hepatitis C: Secondary | ICD-10-CM

## 2016-07-17 NOTE — Telephone Encounter (Signed)
Called [facility] in [place]. They take walk-ins for drug rehab. Pt can walk-in M-F 8am-2:30pm. She will need to take photo ID, SS card, insurance card and proof of income. Pt is aware, also wrote down info with phone number and gave it to her at [place].

## 2016-07-17 NOTE — Progress Notes (Signed)
Referring Provider: Lemmie Evens, MD Primary Care Physician:  Lemmie Evens, MD Primary GI: Dr. Gala Romney   Chief Complaint  Patient presents with  . Cirrhosis    HPI:   Cassandra Jordan is a 66 y.o. female presenting today with a history of ETOH/HCV cirrhosis, non-compliant with ETOH and drug abstinence historically. Needs Hep C treatment (genotype 1b) but not a candidate while still using illicit drugs. Due for screening EGD 2018. US abdomen up-to-date and due again in June 2018. Has completed the Hep B series. Last seen in May 2018 and using cocaine. Needs US abdomen.   Cocaine last used Saturday. Wants referral to rehab outpatient. Discussed again how she can't be treated until she has completed rehab. No abdominal pain, N/V.   Past Medical History:  Diagnosis Date  . Arthritis   . Asthma   . Cocaine abuse   . Essential hypertension   . GERD (gastroesophageal reflux disease)   . Hepatitis C   . Hyperlipidemia   . Lichen sclerosus et atrophicus 10/13/2014  . Neuropathic pain   . PSVT (paroxysmal supraventricular tachycardia) ([place])   . Rash   . Renal carcinoma ([place])   . Type 2 diabetes mellitus ([place])   . Vaginal irritation 10/01/2014  . Vaginal itching 10/01/2014  . Yeast infection of the vagina 10/01/2014    Past Surgical History:  Procedure Laterality Date  . ABDOMINAL HYSTERECTOMY    . COLONOSCOPY N/A 07/08/2013   RMR: Multiple colonic polyps treated [name] as described above. Inadequate prepartation comprimised examination there colonic diverticulosis. tubular and tubulovillous adenomas. next TCS 12/2013  . COLONOSCOPY N/A 02/09/2015   RMR: Colonic polyp removed as described above. Tubular adenoma. Next colonoscopy January 2022.  . ESOPHAGOGASTRODUODENOSCOPY N/A 07/08/2013   RMR: Schatzki's ring ; small hiatal hernia-status post passage of a Maloney dilator  . MALONEY DILATION N/A 07/08/2013   Procedure: [name] DILATION;  Surgeon: [name], MD;  [facility];  Service: Endoscopy;  Laterality: N/A;  . ROBOT ASSISTED LAPAROSCOPIC NEPHRECTOMY Right 10/14/2013   Procedure: ROBOTIC ASSISTED LAPAROSCOPIC RADICAL NEPHRECTOMY;  Surgeon: [name], MD;  [facility];  Service: Urology;  Laterality: Right;  . SAVORY DILATION N/A 07/08/2013   Procedure: SAVORY DILATION;  Surgeon: [name], MD;  [facility];  Service: Endoscopy;  Laterality: N/A;  . TONSILLECTOMY    . Wart removal      Current Outpatient Prescriptions  Medication Sig Dispense Refill  . amiodarone (PACERONE) 100 MG tablet TAKE ONE TABLET BY MOUTH ONCE DAILY - 90 tablet 2  . cloNIDine (CATAPRES) 0.1 MG tablet Take 2 tablets (0.2 mg total) by mouth 2 (two) times daily. 30 tablet 0  . diphenhydrAMINE (BENADRYL) 25 MG tablet Take 2 tablets (50 mg total) by mouth at bedtime as needed for sleep. 20 tablet 0  . gabapentin (NEURONTIN) 300 MG capsule Take 1 capsule (300 mg total) by mouth 2 (two) times daily. 20 capsule 0  . hydrochlorothiazide (HYDRODIURIL) 12.5 MG tablet Take 1 tablet by mouth daily.    . insulin NPH Human (HUMULIN N,NOVOLIN N) 100 UNIT/ML injection Inject 3 Units into the skin 3 (three) times daily before meals.     Marland Kitchen lisinopril-hydrochlorothiazide (PRINZIDE,ZESTORETIC) 20-25 MG tablet Take 1 tablet by mouth daily.    Marland Kitchen loratadine (CLARITIN) 10 MG tablet Take 1 tablet (10 mg total) by mouth daily. 30 tablet 0  . Melatonin 1 MG CAPS Take 1 capsule (1 mg total) by mouth  at bedtime as needed (sleep). 30 capsule 0  . metoprolol succinate (TOPROL-XL) 50 MG 24 hr tablet Take 50 mg by mouth daily.    . pantoprazole (PROTONIX) 40 MG tablet Take 1 tablet (40 mg total) by mouth daily. Take 30 minutes before breakfast 90 tablet 3  . penicillin v potassium (VEETID) 500 MG tablet Take 1 tablet (500 mg total) by mouth 4 (four) times daily. (Patient not taking: Reported on 05/30/2016) 28 tablet 0   No current facility-administered medications for this visit.      Allergies as of 07/17/2016 - Review Complete 07/17/2016  Allergen Reaction Noted  . Sulfa antibiotics Itching and Other (See Comments) 04/10/2012    Family History  Problem Relation Age of Onset  . Colon cancer Sister 24  . Cancer Mother   . Heart attack Father   . Hypertension Daughter   . Cancer Maternal Grandfather   . Diabetes Paternal Grandmother     Social History   Social History  . Marital status: Single    Spouse name: N/A  . Number of children: N/A  . Years of education: N/A   Social History Main Topics  . Smoking status: Current Every Day Smoker    Packs/day: 0.10    Years: 40.00    Types: Cigarettes  . Smokeless tobacco: Never Used  . Alcohol use No     Comment: rarely  . Drug use: Yes    Types: Marijuana, Cocaine     Comment: cocaine only-05/30/16  . Sexual activity: Not Currently    Birth control/ protection: Surgical     Comment: hyst   Other Topics Concern  . None   Social History Narrative  . None    Review of Systems: As mentioned in HPI   Physical Exam: BP (!) 173/99   Pulse 66   Temp 97 F (36.1 C) (Oral)   Ht 5' (1.524 m)   Wt 148 lb 3.2 oz (67.2 kg)   BMI 28.94 kg/m  General:   Alert and oriented. No distress noted. Pleasant and cooperative.  Head:  Normocephalic and atraumatic. Eyes:  Conjuctiva clear without scleral icterus. Mouth:  Oral mucosa pink and moist.  Heart:  S1, S2 present without murmurs Abdomen:  +BS, soft, non-tender and non-distended. No rebound or guarding. No HSM or masses noted. Msk:  Symmetrical without gross deformities. Normal posture. Extremities:  Without edema. Neurologic:  Alert and  oriented x4;  grossly normal neurologically. Psych:  Alert and cooperative. Normal mood and affect.  Lab Results  Component Value Date   ALT 33 (H) 05/30/2016   AST 40 (H) 05/30/2016   ALKPHOS 60 05/30/2016   BILITOT 0.3 05/30/2016   Lab Results  Component Value Date   CREATININE 1.14 (H) 05/30/2016   BUN 19  05/30/2016   NA 137 05/30/2016   K 3.6 05/30/2016   CL 97 (L) 05/30/2016   CO2 27 05/30/2016   Lab Results  Component Value Date   WBC 9.7 05/30/2016   HGB 16.5 (H) 05/30/2016   HCT 49.8 (H) 05/30/2016   MCV 88.9 05/30/2016   PLT 263 05/30/2016

## 2016-07-17 NOTE — Patient Instructions (Signed)
Please have drug screen done at the lab in 4 weeks. If it is negative, we will pursue scheduling an upper endoscopy.  Please have ultrasound of liver done as scheduled.

## 2016-07-19 NOTE — Assessment & Plan Note (Signed)
66 year old female with genotype 1b, treatment-naive. Not a candidate for treatment due to persistent cocaine use. Continues to use and difficult to schedule EGD for variceal screening due to non-compliance. Will order routine US abdomen now. Discussed drug cessation at length. Will complete drug screen in 4 weeks. If positive, I have told her we may need to discharge from the practice due to persistent non-compliance and hindering care for her. However, if this is negative, I will have her come in for a visit to arrange an EGD. I am also referring her for rehab, which she is agreeable to. Hopefully, this will help encourage her to abstain from illicit drug use.

## 2016-07-19 NOTE — Progress Notes (Signed)
CC'D TO PCP °

## 2016-07-24 ENCOUNTER — Ambulatory Visit (HOSPITAL_COMMUNITY): Payer: Medicare Other

## 2016-08-01 DIAGNOSIS — E1142 Type 2 diabetes mellitus with diabetic polyneuropathy: Secondary | ICD-10-CM | POA: Diagnosis not present

## 2016-08-01 DIAGNOSIS — I10 Essential (primary) hypertension: Secondary | ICD-10-CM | POA: Diagnosis not present

## 2016-08-01 DIAGNOSIS — F419 Anxiety disorder, unspecified: Secondary | ICD-10-CM | POA: Diagnosis not present

## 2016-08-08 ENCOUNTER — Encounter (HOSPITAL_COMMUNITY): Payer: Self-pay | Admitting: Cardiology

## 2016-08-08 ENCOUNTER — Emergency Department (HOSPITAL_COMMUNITY): Payer: Medicare Other

## 2016-08-08 ENCOUNTER — Emergency Department (HOSPITAL_COMMUNITY)
Admission: EM | Admit: 2016-08-08 | Discharge: 2016-08-08 | Disposition: A | Discharge status: Home / Self Care | Payer: Medicare Other | Attending: Emergency Medicine | Admitting: Emergency Medicine

## 2016-08-08 DIAGNOSIS — N3000 Acute cystitis without hematuria: Secondary | ICD-10-CM | POA: Insufficient documentation

## 2016-08-08 DIAGNOSIS — I10 Essential (primary) hypertension: Secondary | ICD-10-CM | POA: Insufficient documentation

## 2016-08-08 DIAGNOSIS — R519 Headache, unspecified: Secondary | ICD-10-CM

## 2016-08-08 DIAGNOSIS — J45909 Unspecified asthma, uncomplicated: Secondary | ICD-10-CM | POA: Insufficient documentation

## 2016-08-08 DIAGNOSIS — E876 Hypokalemia: Secondary | ICD-10-CM | POA: Diagnosis not present

## 2016-08-08 DIAGNOSIS — E119 Type 2 diabetes mellitus without complications: Secondary | ICD-10-CM | POA: Diagnosis not present

## 2016-08-08 DIAGNOSIS — R51 Headache: Secondary | ICD-10-CM | POA: Diagnosis not present

## 2016-08-08 DIAGNOSIS — Z79899 Other long term (current) drug therapy: Secondary | ICD-10-CM | POA: Insufficient documentation

## 2016-08-08 DIAGNOSIS — F17210 Nicotine dependence, cigarettes, uncomplicated: Secondary | ICD-10-CM | POA: Insufficient documentation

## 2016-08-08 LAB — URINALYSIS, ROUTINE W REFLEX MICROSCOPIC
Bilirubin Urine: NEGATIVE
GLUCOSE, UA: NEGATIVE mg/dL
Ketones, ur: NEGATIVE mg/dL
Leukocytes, UA: NEGATIVE
Nitrite: POSITIVE — AB
PROTEIN: 100 mg/dL — AB
Specific Gravity, Urine: 1.009 (ref 1.005–1.030)
pH: 7 (ref 5.0–8.0)

## 2016-08-08 LAB — I-STAT CHEM 8, ED
BUN: 16 mg/dL (ref 6–20)
CREATININE: 0.9 mg/dL (ref 0.44–1.00)
Calcium, Ion: 1 mmol/L — ABNORMAL LOW (ref 1.15–1.40)
Chloride: 99 mmol/L — ABNORMAL LOW (ref 101–111)
Glucose, Bld: 96 mg/dL (ref 65–99)
HEMATOCRIT: 48 % — AB (ref 36.0–46.0)
HEMOGLOBIN: 16.3 g/dL — AB (ref 12.0–15.0)
Potassium: 3.3 mmol/L — ABNORMAL LOW (ref 3.5–5.1)
SODIUM: 138 mmol/L (ref 135–145)
TCO2: 33 mmol/L (ref 0–100)

## 2016-08-08 LAB — SEDIMENTATION RATE: SED RATE: 20 mm/h (ref 0–22)

## 2016-08-08 MED ORDER — HYDROCODONE-ACETAMINOPHEN 5-325 MG PO TABS
1.0000 | ORAL_TABLET | Freq: Once | ORAL | Status: AC
  Administered 2016-08-08: 1 via ORAL
  Filled 2016-08-08: qty 1

## 2016-08-08 MED ORDER — POTASSIUM CHLORIDE CRYS ER 20 MEQ PO TBCR: 20.0000 meq | EXTENDED_RELEASE_TABLET | Freq: Two times a day (BID) | ORAL | 0 refills | Status: DC

## 2016-08-08 MED ORDER — ACETAMINOPHEN 325 MG PO TABS
650.0000 mg | ORAL_TABLET | Freq: Once | ORAL | Status: AC
  Administered 2016-08-08: 650 mg via ORAL
  Filled 2016-08-08: qty 2

## 2016-08-08 MED ORDER — TETRACAINE HCL 0.5 % OP SOLN
2.0000 [drp] | Freq: Once | OPHTHALMIC | Status: AC
  Administered 2016-08-08: 2 [drp] via OPHTHALMIC
  Filled 2016-08-08: qty 4

## 2016-08-08 MED ORDER — CEPHALEXIN 500 MG PO CAPS: 500.0000 mg | ORAL_CAPSULE | Freq: Four times a day (QID) | ORAL | 0 refills | Status: DC

## 2016-08-08 NOTE — ED Triage Notes (Signed)
Headache for a week.  Nasal congestion.

## 2016-08-08 NOTE — ED Provider Notes (Signed)
  Pt initially seen by Evalee Jefferson, PA-C.  Labs pending at change of shift.  Pt left in my care.  Pt with left temporal headache, work-up for temporal arthritis.    Labs Reviewed  URINALYSIS, ROUTINE W REFLEX MICROSCOPIC - Abnormal; Notable for the following:       Result Value   APPearance HAZY (*)    Hgb urine dipstick SMALL (*)    Protein, ur 100 (*)    Nitrite POSITIVE (*)    Bacteria, UA RARE (*)    Squamous Epithelial / LPF 0-5 (*)    All other components within normal limits  I-STAT CHEM 8, ED - Abnormal; Notable for the following:    Potassium 3.3 (*)    Chloride 99 (*)    Calcium, Ion 1.00 (*)    Hemoglobin 16.3 (*)    HCT 48.0 (*)    All other components within normal limits  URINE CULTURE  SEDIMENTATION RATE   Ct Maxillofacial Wo Contrast  Result Date: 08/08/2016 CLINICAL DATA:  Headache with drainage from left eye for 1 week EXAM: CT MAXILLOFACIAL WITHOUT CONTRAST TECHNIQUE: Multidetector CT imaging of the maxillofacial structures was performed. Multiplanar CT image reconstructions were also generated. COMPARISON:  Head CT January 27, 2016 FINDINGS: Osseous: No fracture or dislocation. No blastic or lytic bone lesions. No bony destruction or expansion. There is degenerative change in the visualized portions of the cervical spine. Orbits: Orbits appear symmetric bilaterally. No intraorbital lesion evident. Sinuses: There is slight mucosal thickening in several ethmoid air cells bilaterally. Other paranasal sinuses are clear. No air-fluid level. No bony destruction or expansion. Ostiomeatal unit complexes are patent bilaterally. There is leftward deviation of the nasal septum. There is no nares obstruction on either side. Soft tissues: No appreciable soft tissue thickening or hematoma. No abscess. Salivary glands appear symmetric bilaterally. No adenopathy. Tongue and tongue base regions appear unremarkable. Visualized pharynx appears normal. There is multifocal dental caries.  Limited intracranial: Visualized mastoid air cells are clear. There is mild diffuse atrophy. No focal intracranial lesions are identified in the regions which are interrogated. There is carotid siphon region calcification bilaterally. IMPRESSION: Slight mucosal thickening in several ethmoid air cells. Other paranasal sinuses are clear. No air-fluid level. No bony destruction or expansion. Ostiomeatal unit complexes are patent bilaterally. There is leftward deviation of the nasal septum. Visualized brain parenchyma shows a degree of atrophy. No focal intracranial lesion identified in regions which are interrogated. Visualized mastoid air cells clear. Degenerative changes noted at several levels in the visualized cervical spine. There is carotid siphon region calcification bilaterally. Electronically Signed   By: Lowella Grip III M.D.   On: 08/08/2016 16:00     Pt feeling better after medications. Discussed mild hypokalemia and likely UTI.  Will treat with Keflex and short course of oral potassium.  Mildly hypertensive on recheck, but pt agrees to take her BP medication when she returns home.  Ambulates with steady gait, no focal neuro deficits.  Headache improved.  Sed rate wnml.  Return precautions discussed.     Kem Parkinson, PA-C 08/08/16 2002    Julianne Rice, MD 08/11/16 [phone]

## 2016-08-08 NOTE — ED Provider Notes (Signed)
[facility] DEPT Provider Note   CSN: [id number] Arrival date & time: 08/08/16  1137     History   Chief Complaint Chief Complaint  Patient presents with  . Headache    HPI Cassandra Jordan is a 66 y.o. female with past medical history significant for HTN, Type 2 DM, asthma, GERD, PSVT and hyperlipidemia presenting with a persistent pain behind her left eye for the past week.  She describes constant aching pain that has not been improved with warm compresses and ibuprofen.  She denies fevers, chills, nasal congestion or drainage and denies significant history of sinus problems but guesses she has a sinus infection.  She is a diabetic who does take her insulin but does not take her metformin as it causes diarrhea and does not check her cbg's at home. She endorses polyuria and polydipsia as well, denies dysuria, abdominal pain, n/v. Also denies vision changes, facial or periorbital swelling and neck pain. She has found no alleviators.  The history is provided by the patient.    Past Medical History:  Diagnosis Date  . Arthritis   . Asthma   . Cocaine abuse   . Essential hypertension   . GERD (gastroesophageal reflux disease)   . Hepatitis C   . Hyperlipidemia   . Lichen sclerosus et atrophicus 10/13/2014  . Neuropathic pain   . PSVT (paroxysmal supraventricular tachycardia) ([place])   . Rash   . Renal carcinoma ([place])   . Type 2 diabetes mellitus ([place])   . Vaginal irritation 10/01/2014  . Vaginal itching 10/01/2014  . Yeast infection of the vagina 10/01/2014    Patient Active Problem List   Diagnosis Date Noted  . History of drug use 07/17/2016  . Constipation 05/30/2016  . Fecal incontinence 01/04/2016  . Hepatic cirrhosis ([place]) 04/08/2015  . History of adenomatous polyp of colon   . History of colonic polyps   . Hx of adenomatous colonic polyps 12/10/2014  . Lichen sclerosus et atrophicus 10/13/2014  . Vaginal itching 10/01/2014  . Vaginal irritation 10/01/2014  . Yeast  infection of the vagina 10/01/2014  . Blurred vision, right eye 03/20/2014  . UTI (lower urinary tract infection) 03/20/2014  . Cocaine abuse 03/20/2014  . Generalized weakness 03/20/2014  . Chest pain 02/10/2014  . Chest pain at rest 02/10/2014  . Type 2 diabetes mellitus ([place])   . Diabetes mellitus, type II ([place])   . Tobacco abuse   . PSVT (paroxysmal supraventricular tachycardia) ([place]) 11/04/2013  . Essential hypertension 10/16/2013  . Renal mass 10/14/2013  . Ovarian mass, left 08/17/2013  . Hepatitis C, chronic ([place]) 07/01/2013  . Elevated LFTs 07/01/2013  . Other dysphagia 07/01/2013  . Encounter for screening colonoscopy 07/01/2013    Past Surgical History:  Procedure Laterality Date  . ABDOMINAL HYSTERECTOMY    . COLONOSCOPY N/A 07/08/2013   RMR: Multiple colonic polyps treated [name] as described above. Inadequate prepartation comprimised examination there colonic diverticulosis. tubular and tubulovillous adenomas. next TCS 12/2013  . COLONOSCOPY N/A 02/09/2015   RMR: Colonic polyp removed as described above. Tubular adenoma. Next colonoscopy January 2022.  . ESOPHAGOGASTRODUODENOSCOPY N/A 07/08/2013   RMR: Schatzki's ring ; small hiatal hernia-status post passage of a Maloney dilator  . MALONEY DILATION N/A 07/08/2013   Procedure: [name] DILATION;  Surgeon: [name], MD;  [facility];  Service: Endoscopy;  Laterality: N/A;  . ROBOT ASSISTED LAPAROSCOPIC NEPHRECTOMY Right 10/14/2013   Procedure: ROBOTIC ASSISTED LAPAROSCOPIC RADICAL NEPHRECTOMY;  Surgeon: [name], MD;  Location: [facility];  Service: Urology;  Laterality: Right;  . SAVORY DILATION N/A 07/08/2013   Procedure: SAVORY DILATION;  Surgeon: [name], MD;  Location: [facility];  Service: Endoscopy;  Laterality: N/A;  . TONSILLECTOMY    . Wart removal      OB History    Gravida Para Term Preterm AB Living   1 1       1    SAB TAB Ectopic Multiple Live Births                    Home Medications    Prior to Admission medications   Medication Sig Start Date End Date Taking? Authorizing Provider  amiodarone (PACERONE) 100 MG tablet TAKE ONE TABLET BY MOUTH ONCE DAILY - 11/16/15   Satira Sark, MD  cloNIDine (CATAPRES) 0.1 MG tablet Take 2 tablets (0.2 mg total) by mouth 2 (two) times daily. 03/20/16   Milton Ferguson, MD  diphenhydrAMINE (BENADRYL) 25 MG tablet Take 2 tablets (50 mg total) by mouth at bedtime as needed for sleep. 01/27/16   Mesner, Corene Cornea, MD  gabapentin (NEURONTIN) 300 MG capsule Take 1 capsule (300 mg total) by mouth 2 (two) times daily. 11/06/14   Francine Graven, DO  hydrochlorothiazide (HYDRODIURIL) 12.5 MG tablet Take 1 tablet by mouth daily. 03/02/16   [provider]  insulin NPH Human (HUMULIN N,NOVOLIN N) 100 UNIT/ML injection Inject 3 Units into the skin 3 (three) times daily before meals.     [provider]  lisinopril-hydrochlorothiazide (PRINZIDE,ZESTORETIC) 20-25 MG tablet Take 1 tablet by mouth daily.    [provider]  loratadine (CLARITIN) 10 MG tablet Take 1 tablet (10 mg total) by mouth daily. 05/19/16   Varney Biles, MD  Melatonin 1 MG CAPS Take 1 capsule (1 mg total) by mouth at bedtime as needed (sleep). 01/27/16   Mesner, Corene Cornea, MD  metoprolol succinate (TOPROL-XL) 50 MG 24 hr tablet Take 50 mg by mouth daily. 03/02/16   [provider]  pantoprazole (PROTONIX) 40 MG tablet Take 1 tablet (40 mg total) by mouth daily. Take 30 minutes before breakfast 03/06/16   Annitta Needs, NP  penicillin v potassium (VEETID) 500 MG tablet Take 1 tablet (500 mg total) by mouth 4 (four) times daily. Patient not taking: Reported on 05/30/2016 05/19/16   Varney Biles, MD    Family History Family History  Problem Relation Age of Onset  . Colon cancer Sister 86  . Cancer Mother   . Heart attack Father   . Hypertension Daughter   . Cancer Maternal Grandfather   . Diabetes Paternal Grandmother      Social History Social History  Substance Use Topics  . Smoking status: Current Every Day Smoker    Packs/day: 0.10    Years: 40.00    Types: Cigarettes  . Smokeless tobacco: Never Used  . Alcohol use No     Comment: rarely     Allergies   Sulfa antibiotics   Review of Systems Review of Systems  Constitutional: Negative for fever.  HENT: Negative for congestion and sore throat.   Eyes: Positive for pain. Negative for photophobia and visual disturbance.  Respiratory: Negative for chest tightness and shortness of breath.   Cardiovascular: Negative for chest pain.  Gastrointestinal: Negative for abdominal pain, nausea and vomiting.  Endocrine: Positive for polydipsia and polyuria.  Genitourinary: Positive for frequency. Negative for dysuria.  Musculoskeletal: Negative for arthralgias, joint swelling and neck pain.  Skin: Negative.  Negative for rash and wound.  Neurological: Negative for dizziness, weakness, light-headedness, numbness and headaches.  Psychiatric/Behavioral: Negative.      Physical Exam Updated Vital Signs BP (!) 166/83 (BP Location: Right Arm)   Pulse 77   Temp 97.9 F (36.6 C) (Oral)   Resp 16   Ht 5' (1.524 m)   Wt 68.7 kg (151 lb 6.4 oz)   SpO2 97%   BMI 29.57 kg/m   Physical Exam  Constitutional: She appears well-developed and well-nourished.  HENT:  Head: Normocephalic and atraumatic.  TTP left temporal artery.   Eyes: Pupils are equal, round, and reactive to light. Conjunctivae, EOM and lids are normal. Right eye exhibits no chemosis. Left eye exhibits no chemosis. Right conjunctiva is not injected. Left conjunctiva is not injected.  IOP left measured x 3 - average pressure 13mHg.   Neck: Normal range of motion.  Cardiovascular: Normal rate, regular rhythm, normal heart sounds and intact distal pulses.   Pulmonary/Chest: Effort normal and breath sounds normal. She has no wheezes.  Abdominal: Soft. Bowel sounds are normal. There is no  tenderness.  Musculoskeletal: Normal range of motion.  Neurological: She is alert.  Skin: Skin is warm and dry.  Psychiatric: She has a normal mood and affect.  Nursing note and vitals reviewed.    ED Treatments / Results  Labs (all labs ordered are listed, but only abnormal results are displayed) Labs Reviewed  URINALYSIS, ROUTINE W REFLEX MICROSCOPIC - Abnormal; Notable for the following:       Result Value   APPearance HAZY (*)    Hgb urine dipstick SMALL (*)    Protein, ur 100 (*)    Nitrite POSITIVE (*)    Bacteria, UA RARE (*)    Squamous Epithelial / LPF 0-5 (*)    All other components within normal limits  URINE CULTURE  SEDIMENTATION RATE  I-STAT CHEM 8, ED    EKG  EKG Interpretation None       Radiology Ct Maxillofacial Wo Contrast  Result Date: 08/08/2016 CLINICAL DATA:  Headache with drainage from left eye for 1 week EXAM: CT MAXILLOFACIAL WITHOUT CONTRAST TECHNIQUE: Multidetector CT imaging of the maxillofacial structures was performed. Multiplanar CT image reconstructions were also generated. COMPARISON:  Head CT January 27, 2016 FINDINGS: Osseous: No fracture or dislocation. No blastic or lytic bone lesions. No bony destruction or expansion. There is degenerative change in the visualized portions of the cervical spine. Orbits: Orbits appear symmetric bilaterally. No intraorbital lesion evident. Sinuses: There is slight mucosal thickening in several ethmoid air cells bilaterally. Other paranasal sinuses are clear. No air-fluid level. No bony destruction or expansion. Ostiomeatal unit complexes are patent bilaterally. There is leftward deviation of the nasal septum. There is no nares obstruction on either side. Soft tissues: No appreciable soft tissue thickening or hematoma. No abscess. Salivary glands appear symmetric bilaterally. No adenopathy. Tongue and tongue base regions appear unremarkable. Visualized pharynx appears normal. There is multifocal dental caries.  Limited intracranial: Visualized mastoid air cells are clear. There is mild diffuse atrophy. No focal intracranial lesions are identified in the regions which are interrogated. There is carotid siphon region calcification bilaterally. IMPRESSION: Slight mucosal thickening in several ethmoid air cells. Other paranasal sinuses are clear. No air-fluid level. No bony destruction or expansion. Ostiomeatal unit complexes are patent bilaterally. There is leftward deviation of the nasal septum. Visualized brain parenchyma shows a degree of atrophy. No focal intracranial lesion identified in regions which are interrogated. Visualized mastoid air  cells clear. Degenerative changes noted at several levels in the visualized cervical spine. There is carotid siphon region calcification bilaterally. Electronically Signed   By: Lowella Grip III M.D.   On: 08/08/2016 16:00    Procedures Procedures (including critical care time)  Medications Ordered in ED Medications  acetaminophen (TYLENOL) tablet 650 mg (650 mg Oral Given 08/08/16 1516)  tetracaine (PONTOCAINE) 0.5 % ophthalmic solution 2 drop (2 drops Left Eye Given 08/08/16 1517)     Initial Impression / Assessment and Plan / ED Course  I have reviewed the triage vital signs and the nursing notes.  Pertinent labs & imaging results that were available during my care of the patient were reviewed by me and considered in my medical decision making (see chart for details).     Pt with left posterior eye and left temple pain. No periorbital edema, pressures normal, visual acuity ok, normal per her report. Cannot rule out temporal arteritis.  Pending labs including sed rate. Urinalysis nitrite positive, culture ordered.    Discussed with Kem Parkinson, PA-C who assumes care of pt.   Final Clinical Impressions(s) / ED Diagnoses   Final diagnoses:  Acute cystitis without hematuria    New Prescriptions New Prescriptions   No medications on file      Landis Martins 08/08/16 [place], [place], DO 08/13/16 1820

## 2016-08-08 NOTE — Discharge Instructions (Signed)
Be sure to take your blood pressure medications as directed.  Call Dr. Vickey Sages office to arrange a follow-up appt.  Return here for any worsening symptoms

## 2016-08-12 LAB — URINE CULTURE: SPECIAL REQUESTS: NORMAL

## 2016-08-13 ENCOUNTER — Telehealth: Payer: Self-pay | Admitting: Emergency Medicine

## 2016-08-13 NOTE — Telephone Encounter (Signed)
Post ED Visit - Positive Culture Follow-up  Culture report reviewed by antimicrobial stewardship pharmacist:  []  Elenor Quinones, Pharm.D. []  Heide Guile, Pharm.D., BCPS AQ-ID []  Parks Neptune, Pharm.D., BCPS []  Alycia Rossetti, Pharm.D., BCPS []  [place], [place].D., BCPS, AAHIVP []  Legrand Como, Pharm.D., BCPS, AAHIVP []  Salome Arnt, PharmD, BCPS []  Dimitri Ped, PharmD, BCPS []  Vincenza Hews, PharmD, BCPS Jimmy Footman PharmD  Positive urine culture Treated with cephalexin, organism sensitive to the same and no further patient follow-up is required at this time.  Hazle Nordmann 08/13/2016, 11:07 AM

## 2016-08-22 ENCOUNTER — Emergency Department (HOSPITAL_COMMUNITY)
Admission: EM | Admit: 2016-08-22 | Discharge: 2016-08-22 | Disposition: A | Discharge status: Home / Self Care | Payer: Medicare Other | Attending: Emergency Medicine | Admitting: Emergency Medicine

## 2016-08-22 ENCOUNTER — Encounter (HOSPITAL_COMMUNITY): Payer: Self-pay | Admitting: Emergency Medicine

## 2016-08-22 DIAGNOSIS — Z794 Long term (current) use of insulin: Secondary | ICD-10-CM | POA: Insufficient documentation

## 2016-08-22 DIAGNOSIS — R1030 Lower abdominal pain, unspecified: Secondary | ICD-10-CM | POA: Insufficient documentation

## 2016-08-22 DIAGNOSIS — R109 Unspecified abdominal pain: Secondary | ICD-10-CM | POA: Diagnosis present

## 2016-08-22 DIAGNOSIS — R350 Frequency of micturition: Secondary | ICD-10-CM | POA: Diagnosis not present

## 2016-08-22 DIAGNOSIS — R300 Dysuria: Secondary | ICD-10-CM | POA: Diagnosis not present

## 2016-08-22 DIAGNOSIS — F17210 Nicotine dependence, cigarettes, uncomplicated: Secondary | ICD-10-CM | POA: Insufficient documentation

## 2016-08-22 DIAGNOSIS — E119 Type 2 diabetes mellitus without complications: Secondary | ICD-10-CM | POA: Insufficient documentation

## 2016-08-22 DIAGNOSIS — Z79899 Other long term (current) drug therapy: Secondary | ICD-10-CM | POA: Insufficient documentation

## 2016-08-22 DIAGNOSIS — I10 Essential (primary) hypertension: Secondary | ICD-10-CM | POA: Insufficient documentation

## 2016-08-22 DIAGNOSIS — J45909 Unspecified asthma, uncomplicated: Secondary | ICD-10-CM | POA: Diagnosis not present

## 2016-08-22 LAB — URINALYSIS, ROUTINE W REFLEX MICROSCOPIC
BACTERIA UA: NONE SEEN
BILIRUBIN URINE: NEGATIVE
Glucose, UA: NEGATIVE mg/dL
Ketones, ur: NEGATIVE mg/dL
Leukocytes, UA: NEGATIVE
Nitrite: NEGATIVE
SPECIFIC GRAVITY, URINE: 1.012 (ref 1.005–1.030)
pH: 5 (ref 5.0–8.0)

## 2016-08-22 LAB — COMPREHENSIVE METABOLIC PANEL
ALK PHOS: 54 U/L (ref 38–126)
ALT: 39 U/L (ref 14–54)
ANION GAP: 12 (ref 5–15)
AST: 48 U/L — AB (ref 15–41)
Albumin: 3.2 g/dL — ABNORMAL LOW (ref 3.5–5.0)
BILIRUBIN TOTAL: 0.8 mg/dL (ref 0.3–1.2)
BUN: 22 mg/dL — AB (ref 6–20)
CALCIUM: 9.3 mg/dL (ref 8.9–10.3)
CO2: 27 mmol/L (ref 22–32)
CREATININE: 1.18 mg/dL — AB (ref 0.44–1.00)
Chloride: 97 mmol/L — ABNORMAL LOW (ref 101–111)
GFR calc Af Amer: 55 mL/min — ABNORMAL LOW (ref >60)
GFR calc non Af Amer: 47 mL/min — ABNORMAL LOW (ref >60)
GLUCOSE: 114 mg/dL — AB (ref 65–99)
Potassium: 3.6 mmol/L (ref 3.5–5.1)
SODIUM: 136 mmol/L (ref 135–145)
TOTAL PROTEIN: 8 g/dL (ref 6.5–8.1)

## 2016-08-22 LAB — CBC WITH DIFFERENTIAL/PLATELET
Basophils Absolute: 0 10*3/uL (ref 0.0–0.1)
Basophils Relative: 0 %
EOS PCT: 1 %
Eosinophils Absolute: 0.2 10*3/uL (ref 0.0–0.7)
HCT: 47.2 % — ABNORMAL HIGH (ref 36.0–46.0)
HEMOGLOBIN: 15.8 g/dL — AB (ref 12.0–15.0)
LYMPHS ABS: 3.3 10*3/uL (ref 0.7–4.0)
Lymphocytes Relative: 23 %
MCH: 29.8 pg (ref 26.0–34.0)
MCHC: 33.5 g/dL (ref 30.0–36.0)
MCV: 89.1 fL (ref 78.0–100.0)
MONO ABS: 1.1 10*3/uL — AB (ref 0.1–1.0)
MONOS PCT: 8 %
NEUTROS PCT: 68 %
Neutro Abs: 10 10*3/uL — ABNORMAL HIGH (ref 1.7–7.7)
Platelets: 234 10*3/uL (ref 150–400)
RBC: 5.3 MIL/uL — ABNORMAL HIGH (ref 3.87–5.11)
RDW: 12.7 % (ref 11.5–15.5)
WBC: 14.5 10*3/uL — ABNORMAL HIGH (ref 4.0–10.5)

## 2016-08-22 LAB — LIPASE, BLOOD: Lipase: 23 U/L (ref 11–51)

## 2016-08-22 MED ORDER — HYDROMORPHONE HCL 1 MG/ML IJ SOLN
0.5000 mg | INTRAMUSCULAR | Status: DC | PRN
  Administered 2016-08-22: 0.5 mg via INTRAVENOUS
  Filled 2016-08-22: qty 1

## 2016-08-22 MED ORDER — METRONIDAZOLE 500 MG PO TABS
500.0000 mg | ORAL_TABLET | Freq: Once | ORAL | Status: AC
  Administered 2016-08-22: 500 mg via ORAL
  Filled 2016-08-22: qty 1

## 2016-08-22 MED ORDER — SODIUM CHLORIDE 0.9 % IV BOLUS (SEPSIS)
1000.0000 mL | Freq: Once | INTRAVENOUS | Status: AC
  Administered 2016-08-22: 1000 mL via INTRAVENOUS

## 2016-08-22 MED ORDER — CIPROFLOXACIN HCL 500 MG PO TABS: 500.0000 mg | ORAL_TABLET | Freq: Two times a day (BID) | ORAL | 0 refills | Status: DC

## 2016-08-22 MED ORDER — METRONIDAZOLE 500 MG PO TABS: 500.0000 mg | ORAL_TABLET | Freq: Three times a day (TID) | ORAL | 0 refills | Status: DC

## 2016-08-22 MED ORDER — SODIUM CHLORIDE 0.9 % IV SOLN: INTRAVENOUS | Status: DC

## 2016-08-22 MED ORDER — ONDANSETRON HCL 4 MG/2ML IJ SOLN
4.0000 mg | Freq: Once | INTRAMUSCULAR | Status: AC
  Administered 2016-08-22: 4 mg via INTRAVENOUS
  Filled 2016-08-22: qty 2

## 2016-08-22 MED ORDER — CIPROFLOXACIN HCL 250 MG PO TABS
500.0000 mg | ORAL_TABLET | Freq: Once | ORAL | Status: AC
  Administered 2016-08-22: 500 mg via ORAL
  Filled 2016-08-22: qty 2

## 2016-08-22 NOTE — ED Triage Notes (Signed)
Pt c/o ruq abd pain x 2 days and pelvic pain x 1 week. Pt states she was treated for a uti x 2 weeks ago but does not feel that it has gone away. pt also c/o n/d. Denies vomiting.

## 2016-08-22 NOTE — Discharge Instructions (Signed)
Return to the ED tomorrow to be rechecked as we discussed.  If the symptoms get worse, go to [facility] ED for a CT scan.

## 2016-08-22 NOTE — ED Provider Notes (Signed)
[facility] DEPT Provider Note   CSN: [id number] Arrival date & time: 08/22/16  7408     History   Chief Complaint Chief Complaint  Patient presents with  . Abdominal Pain    HPI Cassandra Jordan is a 66 y.o. female.  HPI Patient presents to the emergency room for evaluation of abdominal pain. Patient states for the last few days she's had pain in her upper and lower part of her right side of her abdomen. Pain is sharp and cramping. She also has had urinary frequency and dysuria. It especially hurts at the end of the urination. 2 weeks ago she was treated for urinary tract infection. She does not think her symptoms ever went away. She denies any nausea. No vomiting. She's had some loose stools. She does have history of renal cell carcinoma. She had a right nephrectomy. She denies any other abdominal surgeries. Past Medical History:  Diagnosis Date  . Arthritis   . Asthma   . Cocaine abuse   . Essential hypertension   . GERD (gastroesophageal reflux disease)   . Hepatitis C   . Hyperlipidemia   . Lichen sclerosus et atrophicus 10/13/2014  . Neuropathic pain   . PSVT (paroxysmal supraventricular tachycardia) ([place])   . Rash   . Renal carcinoma ([place])   . Type 2 diabetes mellitus ([place])   . Vaginal irritation 10/01/2014  . Vaginal itching 10/01/2014  . Yeast infection of the vagina 10/01/2014    Patient Active Problem List   Diagnosis Date Noted  . History of drug use 07/17/2016  . Constipation 05/30/2016  . Fecal incontinence 01/04/2016  . Hepatic cirrhosis ([place]) 04/08/2015  . History of adenomatous polyp of colon   . History of colonic polyps   . Hx of adenomatous colonic polyps 12/10/2014  . Lichen sclerosus et atrophicus 10/13/2014  . Vaginal itching 10/01/2014  . Vaginal irritation 10/01/2014  . Yeast infection of the vagina 10/01/2014  . Blurred vision, right eye 03/20/2014  . UTI (lower urinary tract infection) 03/20/2014  . Cocaine abuse 03/20/2014  . Generalized  weakness 03/20/2014  . Chest pain 02/10/2014  . Chest pain at rest 02/10/2014  . Type 2 diabetes mellitus ([place])   . Diabetes mellitus, type II ([place])   . Tobacco abuse   . PSVT (paroxysmal supraventricular tachycardia) ([place]) 11/04/2013  . Essential hypertension 10/16/2013  . Renal mass 10/14/2013  . Ovarian mass, left 08/17/2013  . Hepatitis C, chronic ([place]) 07/01/2013  . Elevated LFTs 07/01/2013  . Other dysphagia 07/01/2013  . Encounter for screening colonoscopy 07/01/2013    Past Surgical History:  Procedure Laterality Date  . ABDOMINAL HYSTERECTOMY    . COLONOSCOPY N/A 07/08/2013   RMR: Multiple colonic polyps treated [name] as described above. Inadequate prepartation comprimised examination there colonic diverticulosis. tubular and tubulovillous adenomas. next TCS 12/2013  . COLONOSCOPY N/A 02/09/2015   RMR: Colonic polyp removed as described above. Tubular adenoma. Next colonoscopy January 2022.  . ESOPHAGOGASTRODUODENOSCOPY N/A 07/08/2013   RMR: Schatzki's ring ; small hiatal hernia-status post passage of a Maloney dilator  . MALONEY DILATION N/A 07/08/2013   Procedure: [name] DILATION;  Surgeon: [name], MD;  [facility];  Service: Endoscopy;  Laterality: N/A;  . ROBOT ASSISTED LAPAROSCOPIC NEPHRECTOMY Right 10/14/2013   Procedure: ROBOTIC ASSISTED LAPAROSCOPIC RADICAL NEPHRECTOMY;  Surgeon: [name], MD;  Location: [facility];  Service: Urology;  Laterality: Right;  . SAVORY DILATION N/A 07/08/2013   Procedure: SAVORY DILATION;  Surgeon: [name],  MD;  [facility];  Service: Endoscopy;  Laterality: N/A;  . TONSILLECTOMY    . Wart removal      OB History    Gravida Para Term Preterm AB Living   1 1       1    SAB TAB Ectopic Multiple Live Births                   Home Medications    Prior to Admission medications   Medication Sig Start Date End Date Taking? Authorizing Provider  gabapentin (NEURONTIN) 300 MG capsule Take 1  capsule (300 mg total) by mouth 2 (two) times daily. Patient taking differently: Take 600 mg by mouth 3 (three) times daily.  11/06/14  Yes Francine Graven, DO  hydrochlorothiazide (HYDRODIURIL) 12.5 MG tablet Take 1 tablet by mouth daily. 03/02/16  Yes [provider]  insulin detemir (LEVEMIR) 100 UNIT/ML injection Inject 35 Units into the skin 2 (two) times daily.   Yes [provider]  lisinopril-hydrochlorothiazide (PRINZIDE,ZESTORETIC) 20-25 MG tablet Take 1 tablet by mouth daily.   Yes [provider]  loratadine (CLARITIN) 10 MG tablet Take 1 tablet (10 mg total) by mouth daily. 05/19/16  Yes Nanavati, Ankit, MD  LORazepam (ATIVAN) 1 MG tablet Take 1 mg by mouth every 8 (eight) hours.   Yes [provider]  Melatonin 1 MG CAPS Take 1 capsule (1 mg total) by mouth at bedtime as needed (sleep). Patient taking differently: Take 3 mg by mouth at bedtime as needed (sleep).  01/27/16  Yes Mesner, Corene Cornea, MD  metoprolol succinate (TOPROL-XL) 50 MG 24 hr tablet Take 50 mg by mouth 2 (two) times daily.  03/02/16  Yes [provider]  pantoprazole (PROTONIX) 40 MG tablet Take 1 tablet (40 mg total) by mouth daily. Take 30 minutes before breakfast 03/06/16  Yes Annitta Needs, NP  amiodarone (PACERONE) 100 MG tablet TAKE ONE TABLET BY MOUTH ONCE DAILY - 11/16/15   Satira Sark, MD  cephALEXin (KEFLEX) 500 MG capsule Take 1 capsule (500 mg total) by mouth 4 (four) times daily. 08/08/16   Triplett, Tammy, PA-C  ciprofloxacin (CIPRO) 500 MG tablet Take 1 tablet (500 mg total) by mouth 2 (two) times daily. 08/22/16   Dorie Rank, MD  cloNIDine (CATAPRES) 0.1 MG tablet Take 2 tablets (0.2 mg total) by mouth 2 (two) times daily. 03/20/16   Milton Ferguson, MD  metroNIDAZOLE (FLAGYL) 500 MG tablet Take 1 tablet (500 mg total) by mouth 3 (three) times daily. 08/22/16   Dorie Rank, MD  penicillin v potassium (VEETID) 500 MG tablet Take 1 tablet (500 mg total) by mouth 4 (four)  times daily. Patient not taking: Reported on 05/30/2016 05/19/16   Varney Biles, MD  potassium chloride SA (K-DUR,KLOR-CON) 20 MEQ tablet Take 1 tablet (20 mEq total) by mouth 2 (two) times daily. 08/08/16   Kem Parkinson, PA-C    Family History Family History  Problem Relation Age of Onset  . Colon cancer Sister 34  . Cancer Mother   . Heart attack Father   . Hypertension Daughter   . Cancer Maternal Grandfather   . Diabetes Paternal Grandmother     Social History Social History  Substance Use Topics  . Smoking status: Current Every Day Smoker    Packs/day: 0.10    Years: 40.00    Types: Cigarettes  . Smokeless tobacco: Never Used  . Alcohol use No     Comment: rarely  Allergies   Sulfa antibiotics   Review of Systems Review of Systems  All other systems reviewed and are negative.    Physical Exam Updated Vital Signs BP (!) 151/87 (BP Location: Right Arm)   Pulse 69   Temp 98 F (36.7 C)   Resp 18   Ht 1.524 m (5')   Wt 63.5 kg (140 lb)   SpO2 94%   BMI 27.34 kg/m   Physical Exam  Constitutional: She appears well-developed and well-nourished. No distress.  HENT:  Head: Normocephalic and atraumatic.  Right Ear: External ear normal.  Left Ear: External ear normal.  Eyes: Conjunctivae are normal. Right eye exhibits no discharge. Left eye exhibits no discharge. No scleral icterus.  Neck: Neck supple. No tracheal deviation present.  Cardiovascular: Normal rate, regular rhythm and intact distal pulses.   Pulmonary/Chest: Effort normal and breath sounds normal. No stridor. No respiratory distress. She has no wheezes. She has no rales.  Abdominal: Soft. Bowel sounds are normal. She exhibits no distension. There is tenderness in the right lower quadrant. There is no rebound and no guarding.  Musculoskeletal: She exhibits no edema or tenderness.  Neurological: She is alert. She has normal strength. No cranial nerve deficit (no facial droop, extraocular  movements intact, no slurred speech) or sensory deficit. She exhibits normal muscle tone. She displays no seizure activity. Coordination normal.  Skin: Skin is warm and dry. No rash noted.  Psychiatric: She has a normal mood and affect.  Nursing note and vitals reviewed.    ED Treatments / Results  Labs (all labs ordered are listed, but only abnormal results are displayed) Labs Reviewed  COMPREHENSIVE METABOLIC PANEL - Abnormal; Notable for the following:       Result Value   Chloride 97 (*)    Glucose, Bld 114 (*)    BUN 22 (*)    Creatinine, Ser 1.18 (*)    Albumin 3.2 (*)    AST 48 (*)    GFR calc non Af Amer 47 (*)    GFR calc Af Amer 55 (*)    All other components within normal limits  CBC WITH DIFFERENTIAL/PLATELET - Abnormal; Notable for the following:    WBC 14.5 (*)    RBC 5.30 (*)    Hemoglobin 15.8 (*)    HCT 47.2 (*)    Neutro Abs 10.0 (*)    Monocytes Absolute 1.1 (*)    All other components within normal limits  URINALYSIS, ROUTINE W REFLEX MICROSCOPIC - Abnormal; Notable for the following:    Hgb urine dipstick SMALL (*)    Protein, ur >=300 (*)    Squamous Epithelial / LPF 0-5 (*)    All other components within normal limits  LIPASE, BLOOD    Radiology No results found.  Procedures Procedures (including critical care time)  Medications Ordered in ED Medications  sodium chloride 0.9 % bolus 1,000 mL ( Intravenous Not Given 08/22/16 1158)    And  0.9 %  sodium chloride infusion ( Intravenous Not Given 08/22/16 1115)  HYDROmorphone (DILAUDID) injection 0.5 mg (0.5 mg Intravenous Given 08/22/16 1050)  ciprofloxacin (CIPRO) tablet 500 mg (not administered)  metroNIDAZOLE (FLAGYL) tablet 500 mg (not administered)  ondansetron (ZOFRAN) injection 4 mg (4 mg Intravenous Given 08/22/16 1050)     Initial Impression / Assessment and Plan / ED Course  I have reviewed the triage vital signs and the nursing notes.  Pertinent labs & imaging results that were  available during my care of  the patient were reviewed by me and considered in my medical decision making (see chart for details).   patient presented to the emergency room with complaints of right lower abdominal pain and diarrhea.  Nursing notes mentioned right upper quadrant pain however the patient pointed below the umbilicus during my evaluation.  Patient's laboratory tests show an elevated white blood cell count. Urinalysis is unremarkable.  I think the patient would benefit from a CT scan of her abdomen pelvis to evaluate further for possible, appendicitis, colitis or other etiologies.  Unfortunately, the CT scan is down until it can be repaired which is expected to be around midnight (11 hours from now).  I discussed the options with the patient.  She can go to another ED in [facility] and get the CT scan performed.  I could also start her on abx and have her return to the ED tomorrow to be rechecked.  Pt states she would prefer to come back tomorrow.    Final Clinical Impressions(s) / ED Diagnoses   Final diagnoses:  Lower abdominal pain    New Prescriptions New Prescriptions   CIPROFLOXACIN (CIPRO) 500 MG TABLET    Take 1 tablet (500 mg total) by mouth 2 (two) times daily.   METRONIDAZOLE (FLAGYL) 500 MG TABLET    Take 1 tablet (500 mg total) by mouth 3 (three) times daily.     Dorie Rank, MD 08/22/16 1309

## 2016-08-22 NOTE — ED Notes (Signed)
Difficulty obtaining IV.  Second nurse in to attempt.  Lab attempting labs.

## 2016-09-12 ENCOUNTER — Telehealth: Payer: Self-pay | Admitting: Gastroenterology

## 2016-09-12 NOTE — Telephone Encounter (Signed)
Patient was to complete drug screen end of July as per plan at last visit. Leaving this in her hands now, as we had a long discussion regarding compliance with this. She is due for EGD for variceal screening, and she has chronic Hep C. Non-compliance has remained an issue.  Please send letter that she needs to do drug screen.

## 2016-09-13 NOTE — Telephone Encounter (Signed)
Letter and a copy of the lab order mailed to the pt.

## 2016-10-29 ENCOUNTER — Encounter (HOSPITAL_COMMUNITY): Payer: Self-pay

## 2016-10-29 ENCOUNTER — Emergency Department (HOSPITAL_COMMUNITY): Payer: Medicare Other

## 2016-10-29 ENCOUNTER — Inpatient Hospital Stay (HOSPITAL_COMMUNITY)
Admission: EM | Admit: 2016-10-29 | Discharge: 2016-11-04 | DRG: 389 | Disposition: A | Discharge status: Home / Self Care | Payer: Medicare Other | Attending: Internal Medicine | Admitting: Internal Medicine

## 2016-10-29 DIAGNOSIS — N183 Chronic kidney disease, stage 3 unspecified: Secondary | ICD-10-CM

## 2016-10-29 DIAGNOSIS — Z905 Acquired absence of kidney: Secondary | ICD-10-CM

## 2016-10-29 DIAGNOSIS — K8020 Calculus of gallbladder without cholecystitis without obstruction: Secondary | ICD-10-CM | POA: Diagnosis present

## 2016-10-29 DIAGNOSIS — K56609 Unspecified intestinal obstruction, unspecified as to partial versus complete obstruction: Principal | ICD-10-CM | POA: Diagnosis present

## 2016-10-29 DIAGNOSIS — Z800 Family history of malignant neoplasm of digestive organs: Secondary | ICD-10-CM

## 2016-10-29 DIAGNOSIS — E1142 Type 2 diabetes mellitus with diabetic polyneuropathy: Secondary | ICD-10-CM | POA: Diagnosis present

## 2016-10-29 DIAGNOSIS — N179 Acute kidney failure, unspecified: Secondary | ICD-10-CM | POA: Diagnosis present

## 2016-10-29 DIAGNOSIS — I471 Supraventricular tachycardia: Secondary | ICD-10-CM | POA: Diagnosis not present

## 2016-10-29 DIAGNOSIS — Z90710 Acquired absence of both cervix and uterus: Secondary | ICD-10-CM | POA: Diagnosis not present

## 2016-10-29 DIAGNOSIS — D72829 Elevated white blood cell count, unspecified: Secondary | ICD-10-CM

## 2016-10-29 DIAGNOSIS — I10 Essential (primary) hypertension: Secondary | ICD-10-CM | POA: Diagnosis present

## 2016-10-29 DIAGNOSIS — Z87891 Personal history of nicotine dependence: Secondary | ICD-10-CM | POA: Diagnosis not present

## 2016-10-29 DIAGNOSIS — L900 Lichen sclerosus et atrophicus: Secondary | ICD-10-CM | POA: Diagnosis present

## 2016-10-29 DIAGNOSIS — E119 Type 2 diabetes mellitus without complications: Secondary | ICD-10-CM

## 2016-10-29 DIAGNOSIS — A09 Infectious gastroenteritis and colitis, unspecified: Secondary | ICD-10-CM | POA: Diagnosis present

## 2016-10-29 DIAGNOSIS — Z8249 Family history of ischemic heart disease and other diseases of the circulatory system: Secondary | ICD-10-CM

## 2016-10-29 DIAGNOSIS — E1122 Type 2 diabetes mellitus with diabetic chronic kidney disease: Secondary | ICD-10-CM | POA: Diagnosis present

## 2016-10-29 DIAGNOSIS — Z23 Encounter for immunization: Secondary | ICD-10-CM

## 2016-10-29 DIAGNOSIS — E876 Hypokalemia: Secondary | ICD-10-CM | POA: Diagnosis present

## 2016-10-29 DIAGNOSIS — E869 Volume depletion, unspecified: Secondary | ICD-10-CM | POA: Diagnosis not present

## 2016-10-29 DIAGNOSIS — Z794 Long term (current) use of insulin: Secondary | ICD-10-CM | POA: Diagnosis not present

## 2016-10-29 DIAGNOSIS — K219 Gastro-esophageal reflux disease without esophagitis: Secondary | ICD-10-CM | POA: Diagnosis not present

## 2016-10-29 DIAGNOSIS — B182 Chronic viral hepatitis C: Secondary | ICD-10-CM | POA: Diagnosis not present

## 2016-10-29 DIAGNOSIS — Z86010 Personal history of colonic polyps: Secondary | ICD-10-CM

## 2016-10-29 DIAGNOSIS — A5903 Trichomonal cystitis and urethritis: Secondary | ICD-10-CM | POA: Diagnosis present

## 2016-10-29 DIAGNOSIS — Z833 Family history of diabetes mellitus: Secondary | ICD-10-CM | POA: Diagnosis not present

## 2016-10-29 DIAGNOSIS — J9811 Atelectasis: Secondary | ICD-10-CM | POA: Diagnosis not present

## 2016-10-29 DIAGNOSIS — Z882 Allergy status to sulfonamides status: Secondary | ICD-10-CM

## 2016-10-29 DIAGNOSIS — I129 Hypertensive chronic kidney disease with stage 1 through stage 4 chronic kidney disease, or unspecified chronic kidney disease: Secondary | ICD-10-CM | POA: Diagnosis present

## 2016-10-29 DIAGNOSIS — Z85528 Personal history of other malignant neoplasm of kidney: Secondary | ICD-10-CM | POA: Diagnosis not present

## 2016-10-29 DIAGNOSIS — R1030 Lower abdominal pain, unspecified: Secondary | ICD-10-CM | POA: Diagnosis not present

## 2016-10-29 DIAGNOSIS — E785 Hyperlipidemia, unspecified: Secondary | ICD-10-CM | POA: Diagnosis present

## 2016-10-29 LAB — URINALYSIS, ROUTINE W REFLEX MICROSCOPIC
Glucose, UA: 100 mg/dL — AB
KETONES UR: NEGATIVE mg/dL
LEUKOCYTES UA: NEGATIVE
NITRITE: NEGATIVE
Protein, ur: >300 mg/dL — AB
Specific Gravity, Urine: 1.025 (ref 1.005–1.030)
pH: 6 (ref 5.0–8.0)

## 2016-10-29 LAB — URINALYSIS, MICROSCOPIC (REFLEX)

## 2016-10-29 LAB — COMPREHENSIVE METABOLIC PANEL
ALBUMIN: 3.1 g/dL — AB (ref 3.5–5.0)
ALT: 17 U/L (ref 14–54)
ANION GAP: 11 (ref 5–15)
AST: 22 U/L (ref 15–41)
Alkaline Phosphatase: 44 U/L (ref 38–126)
BUN: 27 mg/dL — ABNORMAL HIGH (ref 6–20)
CHLORIDE: 96 mmol/L — AB (ref 101–111)
CO2: 32 mmol/L (ref 22–32)
Calcium: 9.2 mg/dL (ref 8.9–10.3)
Creatinine, Ser: 1.53 mg/dL — ABNORMAL HIGH (ref 0.44–1.00)
GFR calc non Af Amer: 35 mL/min — ABNORMAL LOW (ref >60)
GFR, EST AFRICAN AMERICAN: 40 mL/min — AB (ref >60)
Glucose, Bld: 154 mg/dL — ABNORMAL HIGH (ref 65–99)
POTASSIUM: 4.7 mmol/L (ref 3.5–5.1)
SODIUM: 139 mmol/L (ref 135–145)
Total Bilirubin: 1.1 mg/dL (ref 0.3–1.2)
Total Protein: 8.1 g/dL (ref 6.5–8.1)

## 2016-10-29 LAB — CBC
HEMATOCRIT: 46.4 % — AB (ref 36.0–46.0)
Hemoglobin: 15.2 g/dL — ABNORMAL HIGH (ref 12.0–15.0)
MCH: 29.7 pg (ref 26.0–34.0)
MCHC: 32.8 g/dL (ref 30.0–36.0)
MCV: 90.6 fL (ref 78.0–100.0)
Platelets: 187 10*3/uL (ref 150–400)
RBC: 5.12 MIL/uL — AB (ref 3.87–5.11)
RDW: 12.7 % (ref 11.5–15.5)
WBC: 18.9 10*3/uL — ABNORMAL HIGH (ref 4.0–10.5)

## 2016-10-29 LAB — CBG MONITORING, ED: GLUCOSE-CAPILLARY: 145 mg/dL — AB (ref 65–99)

## 2016-10-29 LAB — LIPASE, BLOOD: LIPASE: 19 U/L (ref 11–51)

## 2016-10-29 MED ORDER — ONDANSETRON HCL 4 MG PO TABS
4.0000 mg | ORAL_TABLET | Freq: Four times a day (QID) | ORAL | Status: DC | PRN
  Administered 2016-11-02: 4 mg via ORAL
  Filled 2016-10-29: qty 1

## 2016-10-29 MED ORDER — SODIUM CHLORIDE 0.9 % IV BOLUS (SEPSIS)
1000.0000 mL | Freq: Once | INTRAVENOUS | Status: AC
  Administered 2016-10-29: 1000 mL via INTRAVENOUS

## 2016-10-29 MED ORDER — ONDANSETRON HCL 4 MG/2ML IJ SOLN
4.0000 mg | Freq: Once | INTRAMUSCULAR | Status: AC
  Administered 2016-10-29: 4 mg via INTRAVENOUS
  Filled 2016-10-29: qty 2

## 2016-10-29 MED ORDER — ONDANSETRON HCL 4 MG/2ML IJ SOLN: 4.0000 mg | Freq: Four times a day (QID) | INTRAMUSCULAR | Status: DC | PRN

## 2016-10-29 MED ORDER — MORPHINE SULFATE (PF) 2 MG/ML IV SOLN
2.0000 mg | INTRAVENOUS | Status: AC | PRN
Start: 2016-10-29 — End: 2016-10-30
  Administered 2016-10-30 (×4): 2 mg via INTRAVENOUS
  Filled 2016-10-29 (×4): qty 1

## 2016-10-29 MED ORDER — PANTOPRAZOLE SODIUM 40 MG IV SOLR
40.0000 mg | INTRAVENOUS | Status: DC
  Administered 2016-10-30 – 2016-11-01 (×4): 40 mg via INTRAVENOUS
  Filled 2016-10-29 (×4): qty 40

## 2016-10-29 MED ORDER — SODIUM CHLORIDE 0.45 % IV SOLN
INTRAVENOUS | Status: DC
  Administered 2016-10-30 (×3): via INTRAVENOUS

## 2016-10-29 MED ORDER — MORPHINE SULFATE (PF) 4 MG/ML IV SOLN
2.0000 mg | Freq: Once | INTRAVENOUS | Status: AC
  Administered 2016-10-29: 2 mg via INTRAVENOUS
  Filled 2016-10-29: qty 1

## 2016-10-29 NOTE — ED Provider Notes (Signed)
[facility] DEPT Provider Note   CSN: [id number] Arrival date & time: 10/29/16  1402     History   Chief Complaint Chief Complaint  Patient presents with  . Abdominal Pain  . Emesis    HPI Cassandra Jordan is a 66 y.o. female.  HPI  The patient is a 66 year old female with a known history of a right-sided nephrectomy secondary to renal cell carcinoma, she also has a history of a hysterectomy several years ago, she has had 2 weeks of diarrhea which she describes as watery, voluminous, multiple episodes per day and associated with right lower quadrant abdominal pain which presented 3 days ago. She denies blood in the stool, denies any back pain chest pain coughing or shortness of breath and has had no fevers or chills. She does have nausea and vomiting and has had nothing to eat or drink today because of pain and nausea. No swelling in the legs, no headache, blurred vision, sore throat. The symptoms are persistent and seemed to be gradually worsening. No medications given prior to arrival, no evaluation done for this illness in the last 2 weeks.  Past Medical History:  Diagnosis Date  . Arthritis   . Asthma   . Cocaine abuse ([place])   . Essential hypertension   . GERD (gastroesophageal reflux disease)   . Hepatitis C   . Hyperlipidemia   . Lichen sclerosus et atrophicus 10/13/2014  . Neuropathic pain   . PSVT (paroxysmal supraventricular tachycardia) ([place])   . Rash   . Renal carcinoma ([place])   . Type 2 diabetes mellitus ([place])   . Vaginal irritation 10/01/2014  . Vaginal itching 10/01/2014  . Yeast infection of the vagina 10/01/2014    Patient Active Problem List   Diagnosis Date Noted  . History of drug use 07/17/2016  . Constipation 05/30/2016  . Fecal incontinence 01/04/2016  . Hepatic cirrhosis ([place]) 04/08/2015  . History of adenomatous polyp of colon   . History of colonic polyps   . Hx of adenomatous colonic polyps 12/10/2014  . Lichen sclerosus et atrophicus  10/13/2014  . Vaginal itching 10/01/2014  . Vaginal irritation 10/01/2014  . Yeast infection of the vagina 10/01/2014  . Blurred vision, right eye 03/20/2014  . UTI (lower urinary tract infection) 03/20/2014  . Cocaine abuse ([place]) 03/20/2014  . Generalized weakness 03/20/2014  . Chest pain 02/10/2014  . Chest pain at rest 02/10/2014  . Type 2 diabetes mellitus ([place])   . Diabetes mellitus, type II ([place])   . Tobacco abuse   . PSVT (paroxysmal supraventricular tachycardia) ([place]) 11/04/2013  . Essential hypertension 10/16/2013  . Renal mass 10/14/2013  . Ovarian mass, left 08/17/2013  . Hepatitis C, chronic ([place]) 07/01/2013  . Elevated LFTs 07/01/2013  . Other dysphagia 07/01/2013  . Encounter for screening colonoscopy 07/01/2013    Past Surgical History:  Procedure Laterality Date  . ABDOMINAL HYSTERECTOMY    . COLONOSCOPY N/A 07/08/2013   RMR: Multiple colonic polyps treated [name] as described above. Inadequate prepartation comprimised examination there colonic diverticulosis. tubular and tubulovillous adenomas. next TCS 12/2013  . COLONOSCOPY N/A 02/09/2015   RMR: Colonic polyp removed as described above. Tubular adenoma. Next colonoscopy January 2022.  . ESOPHAGOGASTRODUODENOSCOPY N/A 07/08/2013   RMR: Schatzki's ring ; small hiatal hernia-status post passage of a Maloney dilator  . MALONEY DILATION N/A 07/08/2013   Procedure: [name] DILATION;  Surgeon: [name], MD;  Location: [facility] ENDO SUITE;  Service: Endoscopy;  Laterality: N/A;  . ROBOT  ASSISTED LAPAROSCOPIC NEPHRECTOMY Right 10/14/2013   Procedure: ROBOTIC ASSISTED LAPAROSCOPIC RADICAL NEPHRECTOMY;  Surgeon: [name], MD;  Location: [facility] ORS;  Service: Urology;  Laterality: Right;  . SAVORY DILATION N/A 07/08/2013   Procedure: SAVORY DILATION;  Surgeon: [name], MD;  Location: [facility] END[facility] SUITE;  Service: Endoscopy;  Laterality: N/A;  . TONSILLECTOMY    . Wart removal      OB History    Gravida Para Term  Preterm AB Living   1 1       1    SAB TAB Ectopic Multiple Live Births                   Home Medications    Prior to Admission medications   Medication Sig Start Date End Date Taking? Authorizing Provider  amiodarone (PACERONE) 100 MG tablet TAKE ONE TABLET BY MOUTH ONCE DAILY - 11/16/15  Yes Satira Sark, MD  cloNIDine (CATAPRES) 0.1 MG tablet Take 2 tablets (0.2 mg total) by mouth 2 (two) times daily. 03/20/16  Yes Milton Ferguson, MD  gabapentin (NEURONTIN) 300 MG capsule Take 1 capsule (300 mg total) by mouth 2 (two) times daily. Patient taking differently: Take 600 mg by mouth 3 (three) times daily.  11/06/14  Yes Francine Graven, DO  hydrochlorothiazide (HYDRODIURIL) 12.5 MG tablet Take 1 tablet by mouth daily. 03/02/16  Yes [provider]  insulin detemir (LEVEMIR) 100 UNIT/ML injection Inject 3 Units into the skin 3 (three) times daily.    Yes [provider]  lisinopril-hydrochlorothiazide (PRINZIDE,ZESTORETIC) 20-25 MG tablet Take 1 tablet by mouth daily.   Yes [provider]  Melatonin 1 MG CAPS Take 1 capsule (1 mg total) by mouth at bedtime as needed (sleep). Patient taking differently: Take 3 mg by mouth at bedtime as needed (sleep).  01/27/16  Yes Mesner, Corene Cornea, MD  metoprolol succinate (TOPROL-XL) 50 MG 24 hr tablet Take 50 mg by mouth 2 (two) times daily.  03/02/16  Yes [provider]  pantoprazole (PROTONIX) 40 MG tablet Take 1 tablet (40 mg total) by mouth daily. Take 30 minutes before breakfast 03/06/16  Yes Annitta Needs, NP  potassium chloride SA (K-DUR,KLOR-CON) 20 MEQ tablet Take 1 tablet (20 mEq total) by mouth 2 (two) times daily. 08/08/16  Yes Triplett, Tammy, PA-C  cephALEXin (KEFLEX) 500 MG capsule Take 1 capsule (500 mg total) by mouth 4 (four) times daily. Patient not taking: Reported on 10/29/2016 08/08/16   Triplett, Tammy, PA-C  ciprofloxacin (CIPRO) 500 MG tablet Take 1 tablet (500 mg total) by mouth 2 (two) times  daily. Patient not taking: Reported on 10/29/2016 08/22/16   Dorie Rank, MD  LORazepam (ATIVAN) 1 MG tablet Take 1 mg by mouth every 8 (eight) hours.    [provider]  metroNIDAZOLE (FLAGYL) 500 MG tablet Take 1 tablet (500 mg total) by mouth 3 (three) times daily. Patient not taking: Reported on 10/29/2016 08/22/16   Dorie Rank, MD    Family History Family History  Problem Relation Age of Onset  . Colon cancer Sister 66  . Cancer Mother   . Heart attack Father   . Hypertension Daughter   . Cancer Maternal Grandfather   . Diabetes Paternal Grandmother     Social History Social History  Substance Use Topics  . Smoking status: Former Smoker    Packs/day: 0.10    Years: 40.00    Types: Cigarettes  . Smokeless tobacco: Never Used  . Alcohol use No  Comment: rarely     Allergies   Sulfa antibiotics   Review of Systems Review of Systems  All other systems reviewed and are negative.    Physical Exam Updated Vital Signs BP (!) 186/87 (BP Location: Right Arm)   Pulse 94   Temp 99.3 F (37.4 C) (Oral)   Resp 18   Ht 5' 5"  (1.651 m)   Wt 63.5 kg (140 lb)   SpO2 95%   BMI 23.30 kg/m   Physical Exam  Constitutional: She appears well-developed and well-nourished. No distress.  HENT:  Head: Normocephalic and atraumatic.  Mouth/Throat: Oropharynx is clear and moist. No oropharyngeal exudate.  Eyes: Pupils are equal, round, and reactive to light. Conjunctivae and EOM are normal. Right eye exhibits no discharge. Left eye exhibits no discharge. No scleral icterus.  Neck: Normal range of motion. Neck supple. No JVD present. No thyromegaly present.  Cardiovascular: Normal rate, regular rhythm, normal heart sounds and intact distal pulses.  Exam reveals no gallop and no friction rub.   No murmur heard. Pulmonary/Chest: Effort normal and breath sounds normal. No respiratory distress. She has no wheezes. She has no rales.  Abdominal: Soft. Bowel sounds are normal. She  exhibits no distension and no mass. There is tenderness ( focal RLQ ttp, no other ttp and no guarding.).  Musculoskeletal: Normal range of motion. She exhibits no edema or tenderness.  Lymphadenopathy:    She has no cervical adenopathy.  Neurological: She is alert. Coordination normal.  Skin: Skin is warm and dry. No rash noted. No erythema.  Psychiatric: She has a normal mood and affect. Her behavior is normal.  Nursing note and vitals reviewed.    ED Treatments / Results  Labs (all labs ordered are listed, but only abnormal results are displayed) Labs Reviewed  URINALYSIS, ROUTINE W REFLEX MICROSCOPIC - Abnormal; Notable for the following:       Result Value   Glucose, UA 100 (*)    Hgb urine dipstick SMALL (*)    Bilirubin Urine MODERATE (*)    Protein, ur >300 (*)    All other components within normal limits  COMPREHENSIVE METABOLIC PANEL - Abnormal; Notable for the following:    Chloride 96 (*)    Glucose, Bld 154 (*)    BUN 27 (*)    Creatinine, Ser 1.53 (*)    Albumin 3.1 (*)    GFR calc non Af Amer 35 (*)    GFR calc Af Amer 40 (*)    All other components within normal limits  CBC - Abnormal; Notable for the following:    WBC 18.9 (*)    RBC 5.12 (*)    Hemoglobin 15.2 (*)    HCT 46.4 (*)    All other components within normal limits  URINALYSIS, MICROSCOPIC (REFLEX) - Abnormal; Notable for the following:    Bacteria, UA MANY (*)    Squamous Epithelial / LPF 0-5 (*)    All other components within normal limits  CBG MONITORING, ED - Abnormal; Notable for the following:    Glucose-Capillary 145 (*)    All other components within normal limits  C DIFFICILE QUICK SCREEN W PCR REFLEX  LIPASE, BLOOD     Radiology Ct Abdomen Pelvis Wo Contrast  Result Date: 10/29/2016 CLINICAL DATA:  Right-sided abdominal pain, nausea vomiting and diarrhea. Burning with urination for 3 days. History of renal cancer, post nephrectomy. EXAM: CT ABDOMEN AND PELVIS WITHOUT CONTRAST  TECHNIQUE: Multidetector CT imaging of the abdomen and pelvis  was performed following the standard protocol without IV contrast. COMPARISON:  Body CT 11/29/2015 FINDINGS: Lower chest: Calcific atherosclerotic disease of the coronary arteries. Calcifications of the aortic valve annulus. Hepatobiliary: No focal liver abnormality is seen. No gallbladder wall thickening. Again noted of layering gallstones with calcifications of the gallbladder wall. No associated biliary ductal dilation. Pancreas: Unremarkable. No pancreatic ductal dilatation or surrounding inflammatory changes. Spleen: Normal in size without focal abnormality. Adrenals/Urinary Tract: Post right nephrectomy the previously demonstrated probable left renal cyst is not seen likely due to lack of IV contrast. Adrenal glands are normal. Stomach/Bowel: Moderate dilation of small bowel loops in the mid and lower abdomen containing gas fluid levels, with relatively gradual tapering to normal caliber in the distal ileum. Maximum transverse diameter of the dilated small bowel is 3.7 cm. Asymmetric mucosal thickening of the posterior aspect of the cecum, with few scattered wall calcifications, best seen on image 31/82, sequence 2. Mucosal thickening of the distal ileum, best seen on image 48/82, sequence 2. Mesenteric stranding, most prominent in the right mid abdomen. Left colonic diverticulosis. Vascular/Lymphatic: Calcific atherosclerotic disease of the aorta. Numerous shotty mesenteric lymph nodes. Reproductive: Prostate is unremarkable. Other: Right lateral anterior abdominal wall cutaneous/subcutaneous thickening possibly represents a surgical scarring. There is a subcutaneous soft tissue nodularity within the inferior portion of this abnormality, image 69/82 sequence 2 Musculoskeletal: No acute or significant osseous findings. L5-S1 osteoarthritic changes. IMPRESSION: Post right nephrectomy. Small bowel obstruction with relatively gradual tapering to normal  caliber at the level of the distal ileum. Mucosal thickening of the distal ileum and posterior aspect of the cecum, causing stenosis of the distal ileum and likely the cause of the small bowel obstruction. Associated mesenteric stranding and mesenteric lymph node enlargement. These findings may represent infectious colitis, however metastatic deposits or primary malignancy cannot be excluded. Right lateral anterior abdominal wall cutaneous/subcutaneous thickening, possibly representing a surgical scarring with subcutaneous soft tissue nodularity of its inferior portion. Subcutaneous metastatic deposits can't be excluded with this appearance. Cholelithiasis with associated gallbladder wall calcifications. Consider surgical consultation, as gallbladder wall calcifications may be a sign of malignancy. Calcific atherosclerotic disease of the aorta. These results were called by telephone at the time of interpretation on 10/29/2016 at 10:03 pm to Dr. Noemi Chapel , who verbally acknowledged these results. Electronically Signed   By: Fidela Salisbury M.D.   On: 10/29/2016 22:03    Procedures Procedures (including critical care time)  Medications Ordered in ED Medications  ondansetron [facility]) injection 4 mg (4 mg Intravenous Given 10/29/16 2112)  sodium chloride 0.9 % bolus 1,000 mL (1,000 mLs Intravenous New Bag/Given 10/29/16 2112)  morphine 4 MG/ML injection 2 mg (2 mg Intravenous Given 10/29/16 2112)     Initial Impression / Assessment and Plan / ED Course  I have reviewed the triage vital signs and the nursing notes.  Pertinent labs & imaging results that were available during my care of the patient were reviewed by me and considered in my medical decision making (see chart for details).       The patient was informed of the Trichomonas in the urine, no other signs of urinary tract infection, she does have some dysuria which raises the suspicion for STDs given her history of Trichomonas. Labs  show a significant leukocytosis of over 18,000, liver function seems okay, renal function is slightly decreased with a creatinine of 1.5. Will give IV fluids and perform a CT scan of the abdomen and pelvis without contrast given her  solo kidney  CT scan confirms that the patient actually has a small bowel obstruction secondary to inflammatory mucosal thickening in the distal ileum in the cecum. This was discussed with the radiologist.  The patient was given an NG tube, low wall suction, she will be treated for her Trichomonas, discussed with hospitalist, Dr. Olevia Bowens who is kind enough to admit.  Final Clinical Impressions(s) / ED Diagnoses   Final diagnoses:  Small bowel obstruction ([place])  Trichomonal urethritis  AKI (acute kidney injury) ([place])    New Prescriptions New Prescriptions   No medications on file     Noemi Chapel, MD 10/29/16 2227

## 2016-10-29 NOTE — H&P (Signed)
History and Physical    Cassandra Jordan:[id number] DOB: [birth year] DOA: 10/29/2016  PCP: Lemmie Evens, MD   Patient coming from: Home.  I have personally briefly reviewed patient's old medical records in [place]  Chief Complaint: Abdominal pain.  HPI: Cassandra Jordan is a 66 y.o. female with medical history significant of osteoarthritis, asthma, cocaine abuse, essential hypertension, GERD, chronic hep C, hyperlipidemia, diabetic peripheral neuropathy, PSVT, renal carcinoma, type 2 diabetes  who is coming to the emergency department due to epigastric abdominal pain associated with decreased appetite, chills, nausea, several episodes of emesis and diarrhea. She denies sick contacts or travel history. Also denies melena, hematochezia, fever, dyspnea, chest pain, palpitations, PND, orthopnea or pitting edema of the lower extremities. She also complains of dysuria, but denies hematuria or frequency. No polyuria, no polydipsia or blurred vision.  ED Course: Initial vital signs in the emergency department temperature 99.41F, pulse 105, respirations 18, blood pressure 195/150 mmHg and O2 sat 96% on room air. She was given an thousand mL of normal saline bolus, morphine 2 mg and Zofran 4 mg in the emergency department, which she states has provided partial relief. C. difficile toxin and stool was ordered and she is currently on enteric precautions.  Her workup shows an urinalysis with glucosuria, small hemoglobin, moderate bilirubin and protein of more than 300 mg/dL. Microscopic urine exam did not reveal any significant abnormalities. WBC was 18.9, hemoglobin 15.2 g/dL and platelets 187. CMP showed a sodium of 139, potassium 4.7, chloride 96 and bicarbonate 32 mmol/L. BUN was 27, creatinine 1.53 and glucose 154 mg/dL. Her albumin was 3.1 g/dL, but the rest of the LFTs were within normal limits. Lipase was 19.  Imaging: CT scan abdomen and pelvis without contrast revealed right nephrectomy,  SBO, multiple gallstones (present on RUQ [place] on 12/2015) and aortic atherosclerotic disease. Please see images and full radiology report for further detail.  Review of Systems: As per HPI otherwise 10 point review of systems negative.    Past Medical History:  Diagnosis Date  . Arthritis   . Asthma   . Cocaine abuse ([place])   . Essential hypertension   . GERD (gastroesophageal reflux disease)   . Hepatitis C   . Hyperlipidemia   . Lichen sclerosus et atrophicus 10/13/2014  . Neuropathic pain   . PSVT (paroxysmal supraventricular tachycardia) ([place])   . Rash   . Renal carcinoma ([place])   . Type 2 diabetes mellitus ([place])   . Vaginal irritation 10/01/2014  . Vaginal itching 10/01/2014  . Yeast infection of the vagina 10/01/2014    Past Surgical History:  Procedure Laterality Date  . ABDOMINAL HYSTERECTOMY    . COLONOSCOPY N/A 07/08/2013   RMR: Multiple colonic polyps treated [name] as described above. Inadequate prepartation comprimised examination there colonic diverticulosis. tubular and tubulovillous adenomas. next TCS 12/2013  . COLONOSCOPY N/A 02/09/2015   RMR: Colonic polyp removed as described above. Tubular adenoma. Next colonoscopy January 2022.  . ESOPHAGOGASTRODUODENOSCOPY N/A 07/08/2013   RMR: Schatzki's ring ; small hiatal hernia-status post passage of a Maloney dilator  . MALONEY DILATION N/A 07/08/2013   Procedure: [name] DILATION;  Surgeon: [name], MD;  Location: [facility];  Service: Endoscopy;  Laterality: N/A;  . ROBOT ASSISTED LAPAROSCOPIC NEPHRECTOMY Right 10/14/2013   Procedure: ROBOTIC ASSISTED LAPAROSCOPIC RADICAL NEPHRECTOMY;  Surgeon: [name], MD;  Location: [facility] ORS;  Service: Urology;  Laterality: Right;  . SAVORY DILATION N/A 07/08/2013   Procedure: SAVORY DILATION;  Surgeon: [name], MD;  Location: [facility];  Service: Endoscopy;  Laterality: N/A;  . TONSILLECTOMY    . Wart removal       reports that she has quit smoking. Her smoking  use included Cigarettes. She has a 4.00 pack-year smoking history. She has never used smokeless tobacco. She reports that she uses drugs, including Marijuana and Cocaine. She reports that she does not drink alcohol.  Allergies  Allergen Reactions  . Sulfa Antibiotics Itching and Other (See Comments)    burning    Family History  Problem Relation Age of Onset  . Colon cancer Sister 3  . Cancer Mother   . Heart attack Father   . Hypertension Daughter   . Cancer Maternal Grandfather   . Diabetes Paternal Grandmother     Prior to Admission medications   Medication Sig Start Date End Date Taking? Authorizing Provider  amiodarone (PACERONE) 100 MG tablet TAKE ONE TABLET BY MOUTH ONCE DAILY - 11/16/15  Yes Satira Sark, MD  cloNIDine (CATAPRES) 0.1 MG tablet Take 2 tablets (0.2 mg total) by mouth 2 (two) times daily. 03/20/16  Yes Milton Ferguson, MD  gabapentin (NEURONTIN) 300 MG capsule Take 1 capsule (300 mg total) by mouth 2 (two) times daily. Patient taking differently: Take 600 mg by mouth 3 (three) times daily.  11/06/14  Yes Francine Graven, DO  hydrochlorothiazide (HYDRODIURIL) 12.5 MG tablet Take 1 tablet by mouth daily. 03/02/16  Yes [provider]  insulin detemir (LEVEMIR) 100 UNIT/ML injection Inject 3 Units into the skin 3 (three) times daily.    Yes [provider]  lisinopril-hydrochlorothiazide (PRINZIDE,ZESTORETIC) 20-25 MG tablet Take 1 tablet by mouth daily.   Yes [provider]  Melatonin 1 MG CAPS Take 1 capsule (1 mg total) by mouth at bedtime as needed (sleep). Patient taking differently: Take 3 mg by mouth at bedtime as needed (sleep).  01/27/16  Yes Mesner, Corene Cornea, MD  metoprolol succinate (TOPROL-XL) 50 MG 24 hr tablet Take 50 mg by mouth 2 (two) times daily.  03/02/16  Yes [provider]  pantoprazole (PROTONIX) 40 MG tablet Take 1 tablet (40 mg total) by mouth daily. Take 30 minutes before breakfast 03/06/16  Yes Annitta Needs, NP  potassium chloride SA (K-DUR,KLOR-CON) 20 MEQ tablet Take 1 tablet (20 mEq total) by mouth 2 (two) times daily. 08/08/16  Yes Triplett, Tammy, PA-C  cephALEXin (KEFLEX) 500 MG capsule Take 1 capsule (500 mg total) by mouth 4 (four) times daily. Patient not taking: Reported on 10/29/2016 08/08/16   Triplett, Tammy, PA-C  ciprofloxacin (CIPRO) 500 MG tablet Take 1 tablet (500 mg total) by mouth 2 (two) times daily. Patient not taking: Reported on 10/29/2016 08/22/16   Dorie Rank, MD  LORazepam (ATIVAN) 1 MG tablet Take 1 mg by mouth every 8 (eight) hours.    [provider]  metroNIDAZOLE (FLAGYL) 500 MG tablet Take 1 tablet (500 mg total) by mouth 3 (three) times daily. Patient not taking: Reported on 10/29/2016 08/22/16   Dorie Rank, MD    Physical Exam: Vitals:   10/29/16 1429 10/29/16 2059  BP: (!) 195/115 (!) 186/87  Pulse: (!) 105 94  Resp: 18 18  Temp: 99.3 F (37.4 C)   TempSrc: Oral   SpO2: 96% 95%  Weight: 63.5 kg (140 lb)   Height: 5' 5"  (1.651 m)     Constitutional: NAD, calm, comfortable Eyes: PERRL, lids and conjunctivae normal ENMT: Mucous membranes are mildly dry.  Posterior pharynx clear of any exudate or lesions. Neck: normal, supple, no masses, no thyromegaly Respiratory: Decreased breath sounds on bases, but otherwise clear to auscultation bilaterally, no wheezing, no crackles. Normal respiratory effort. No accessory muscle use.  Cardiovascular: Regular rate and rhythm, positive 2/6 systolic murmur, no rubs / gallops. No extremity edema. 2+ pedal pulses. No carotid bruits.  Abdomen: Bowel sounds positive. Soft, positive RUQ and epigastric tenderness, no no guarding/rebound/masses palpated. No hepatosplenomegaly.  Musculoskeletal: no clubbing / cyanosis. Good ROM, no contractures. Normal muscle tone.  Skin: no rashes, lesions, ulcers on limited skin exam. Neurologic: CN 2-12 grossly intact. Sensation intact, DTR normal. Strength 5/5 in all 4.  Psychiatric:  Normal judgment and insight. Alert and oriented x 4. Normal mood.     Labs on Admission: I have personally reviewed following labs and imaging studies  CBC:  Recent Labs Lab 10/29/16 1445  WBC 18.9*  HGB 15.2*  HCT 46.4*  MCV 90.6  PLT 622   Basic Metabolic Panel:  Recent Labs Lab 10/29/16 1445  NA 139  K 4.7  CL 96*  CO2 32  GLUCOSE 154*  BUN 27*  CREATININE 1.53*  CALCIUM 9.2   GFR: Estimated Creatinine Clearance: 33 mL/min (A) (by C-G formula based on SCr of 1.53 mg/dL (H)). Liver Function Tests:  Recent Labs Lab 10/29/16 1445  AST 22  ALT 17  ALKPHOS 44  BILITOT 1.1  PROT 8.1  ALBUMIN 3.1*    Recent Labs Lab 10/29/16 1445  LIPASE 19   No results for input(s): AMMONIA in the last 168 hours. Coagulation Profile: No results for input(s): INR, PROTIME in the last 168 hours. Cardiac Enzymes: No results for input(s): CKTOTAL, CKMB, CKMBINDEX, TROPONINI in the last 168 hours. BNP (last 3 results) No results for input(s): PROBNP in the last 8760 hours. HbA1C: No results for input(s): HGBA1C in the last 72 hours. CBG:  Recent Labs Lab 10/29/16 1434  GLUCAP 145*   Lipid Profile: No results for input(s): CHOL, HDL, LDLCALC, TRIG, CHOLHDL, LDLDIRECT in the last 72 hours. Thyroid Function Tests: No results for input(s): TSH, T4TOTAL, FREET4, T3FREE, THYROIDAB in the last 72 hours. Anemia Panel: No results for input(s): VITAMINB12, FOLATE, FERRITIN, TIBC, IRON, RETICCTPCT in the last 72 hours. Urine analysis:    Component Value Date/Time   COLORURINE YELLOW 10/29/2016 1455   APPEARANCEUR CLEAR 10/29/2016 1455   LABSPEC 1.025 10/29/2016 1455   PHURINE 6.0 10/29/2016 1455   GLUCOSEU 100 (A) 10/29/2016 1455   HGBUR SMALL (A) 10/29/2016 1455   BILIRUBINUR MODERATE (A) 10/29/2016 1455   KETONESUR NEGATIVE 10/29/2016 1455   PROTEINUR >300 (A) 10/29/2016 1455   UROBILINOGEN 0.2 06/30/2014 2000   NITRITE NEGATIVE 10/29/2016 1455   LEUKOCYTESUR  NEGATIVE 10/29/2016 1455    Radiological Exams on Admission: Ct Abdomen Pelvis Wo Contrast  Result Date: 10/29/2016 CLINICAL DATA:  Right-sided abdominal pain, nausea vomiting and diarrhea. Burning with urination for 3 days. History of renal cancer, post nephrectomy. EXAM: CT ABDOMEN AND PELVIS WITHOUT CONTRAST TECHNIQUE: Multidetector CT imaging of the abdomen and pelvis was performed following the standard protocol without IV contrast. COMPARISON:  Body CT 11/29/2015 FINDINGS: Lower chest: Calcific atherosclerotic disease of the coronary arteries. Calcifications of the aortic valve annulus. Hepatobiliary: No focal liver abnormality is seen. No gallbladder wall thickening. Again noted of layering gallstones with calcifications of the gallbladder wall. No associated biliary ductal dilation. Pancreas: Unremarkable. No pancreatic ductal dilatation or surrounding inflammatory changes. Spleen: Normal in size without focal  abnormality. Adrenals/Urinary Tract: Post right nephrectomy the previously demonstrated probable left renal cyst is not seen likely due to lack of IV contrast. Adrenal glands are normal. Stomach/Bowel: Moderate dilation of small bowel loops in the mid and lower abdomen containing gas fluid levels, with relatively gradual tapering to normal caliber in the distal ileum. Maximum transverse diameter of the dilated small bowel is 3.7 cm. Asymmetric mucosal thickening of the posterior aspect of the cecum, with few scattered wall calcifications, best seen on image 31/82, sequence 2. Mucosal thickening of the distal ileum, best seen on image 48/82, sequence 2. Mesenteric stranding, most prominent in the right mid abdomen. Left colonic diverticulosis. Vascular/Lymphatic: Calcific atherosclerotic disease of the aorta. Numerous shotty mesenteric lymph nodes. Reproductive: Prostate is unremarkable. Other: Right lateral anterior abdominal wall cutaneous/subcutaneous thickening possibly represents a surgical  scarring. There is a subcutaneous soft tissue nodularity within the inferior portion of this abnormality, image 69/82 sequence 2 Musculoskeletal: No acute or significant osseous findings. L5-S1 osteoarthritic changes. IMPRESSION: Post right nephrectomy. Small bowel obstruction with relatively gradual tapering to normal caliber at the level of the distal ileum. Mucosal thickening of the distal ileum and posterior aspect of the cecum, causing stenosis of the distal ileum and likely the cause of the small bowel obstruction. Associated mesenteric stranding and mesenteric lymph node enlargement. These findings may represent infectious colitis, however metastatic deposits or primary malignancy cannot be excluded. Right lateral anterior abdominal wall cutaneous/subcutaneous thickening, possibly representing a surgical scarring with subcutaneous soft tissue nodularity of its inferior portion. Subcutaneous metastatic deposits can't be excluded with this appearance. Cholelithiasis with associated gallbladder wall calcifications. Consider surgical consultation, as gallbladder wall calcifications may be a sign of malignancy. Calcific atherosclerotic disease of the aorta. These results were called by telephone at the time of interpretation on 10/29/2016 at 10:03 pm to Dr. Noemi Chapel , who verbally acknowledged these results. Electronically Signed   By: Fidela Salisbury M.D.   On: 10/29/2016 22:03   10/15/2013 2D echocardiogram with contrast.  ------------------------------------------------------------------- LV EF: 65% -  70%  ------------------------------------------------------------------- Indications:   Abnormal EKG 794.31.  ------------------------------------------------------------------- History:  Risk factors: Hypertension. Diabetes mellitus.  ------------------------------------------------------------------- Study Conclusions  - Left ventricle: The cavity size was normal. There was  moderate concentric hypertrophy. Systolic function was vigorous. The estimated ejection fraction was in the range of 65% to 70%. There was dynamic obstruction at restin the mid cavity, with a peak velocity of 3 cm/sec and a peak gradient of 36 mm Hg. Wall motion was normal; there were no regional wall motion abnormalities. Doppler parameters are consistent with abnormal left ventricular relaxation (grade 1 diastolic dysfunction). EKG: Independently reviewed.   Assessment/Plan Principal Problem:   SBO (small bowel obstruction) (HCC) Admit to telemetry/inpatient. Keep nothing by mouth. Continue IV fluids. Continue NGT suctioning. Antiemetics as needed. Low-dose analgesics as needed. Monitor intake and output. General surgery evaluation in a.m.  Active Problems:   AKI (acute kidney injury) ([place]) Secondary to GI losses. Continue IV hydration. Hold diuretic and ACE inhibitor. Monitor intake and output. Follow-up renal function and electrolytes.    Leukocytosis Has been afebrile. Two months ago WBC was 14.5. Monitor temperature, if febrile get blood cultures, repeat UA and consider IV antibiotic therapy. Otherwise follow up WBC in AM.    Hepatitis C, chronic (HCC) Monitor LFTs. Should follow up with hepatologist as an outpatient if desired.    Essential hypertension Metoprolol 5 mg IVP every 6 hours. Hydralazine 10 mg every 4 hours as needed for  SBP over 1 50 mmHg. Monitor blood pressure and heart rate.    PSVT (paroxysmal supraventricular tachycardia) (HCC) Metoprolol 5 mg IVP every 6 hours. Resume oral metoprolol once cleared for oral intake. Continue telemetry while using IV metoprolol.    Diabetes mellitus, type II ([place]) Currently nothing by mouth. CBG monitoring every 6 hours until diet is resumed. Once tolerating diet, CBG monitoring with RI SS before meals.    GERD (gastroesophageal reflux disease) Protonix 40 mg IVP every 24 hours.     DVT  prophylaxis: SCDs. Code Status: Full code. Family Communication:  Disposition Plan: Admit for bowel rest, NGT suction, IV hydration and symptoms treatment. Consults called: General surgery consult in AM Admission status: Inpatient/Telemetry.   Reubin Milan MD [facility] Pager [phone].  If 7PM-7AM, please contact night-coverage [email or web address] Password [email or web address]  10/29/2016, 11:15 PM

## 2016-10-29 NOTE — ED Triage Notes (Signed)
Pt reports r sided abd pain, n/v/d and burning with urination x 3 days.  Reports has history of nephrectomy due to kidney cancer.

## 2016-10-30 ENCOUNTER — Inpatient Hospital Stay (HOSPITAL_COMMUNITY): Payer: Medicare Other

## 2016-10-30 ENCOUNTER — Encounter (HOSPITAL_COMMUNITY): Payer: Self-pay

## 2016-10-30 DIAGNOSIS — N179 Acute kidney failure, unspecified: Secondary | ICD-10-CM | POA: Diagnosis present

## 2016-10-30 DIAGNOSIS — D72829 Elevated white blood cell count, unspecified: Secondary | ICD-10-CM

## 2016-10-30 DIAGNOSIS — K56609 Unspecified intestinal obstruction, unspecified as to partial versus complete obstruction: Principal | ICD-10-CM

## 2016-10-30 LAB — GLUCOSE, CAPILLARY
GLUCOSE-CAPILLARY: 108 mg/dL — AB (ref 65–99)
GLUCOSE-CAPILLARY: 77 mg/dL (ref 65–99)
GLUCOSE-CAPILLARY: 79 mg/dL (ref 65–99)
GLUCOSE-CAPILLARY: 89 mg/dL (ref 65–99)
Glucose-Capillary: 69 mg/dL (ref 65–99)

## 2016-10-30 LAB — CBC WITH DIFFERENTIAL/PLATELET
BASOS PCT: 0 %
Basophils Absolute: 0 10*3/uL (ref 0.0–0.1)
EOS ABS: 0.2 10*3/uL (ref 0.0–0.7)
Eosinophils Relative: 1 %
HEMATOCRIT: 42.8 % (ref 36.0–46.0)
Hemoglobin: 13.6 g/dL (ref 12.0–15.0)
Lymphocytes Relative: 36 %
Lymphs Abs: 5.2 10*3/uL — ABNORMAL HIGH (ref 0.7–4.0)
MCH: 28.9 pg (ref 26.0–34.0)
MCHC: 31.8 g/dL (ref 30.0–36.0)
MCV: 91.1 fL (ref 78.0–100.0)
MONO ABS: 1.2 10*3/uL — AB (ref 0.1–1.0)
MONOS PCT: 9 %
NEUTROS ABS: 7.6 10*3/uL (ref 1.7–7.7)
Neutrophils Relative %: 54 %
Platelets: 190 10*3/uL (ref 150–400)
RBC: 4.7 MIL/uL (ref 3.87–5.11)
RDW: 12.8 % (ref 11.5–15.5)
WBC: 14.2 10*3/uL — ABNORMAL HIGH (ref 4.0–10.5)

## 2016-10-30 LAB — COMPREHENSIVE METABOLIC PANEL
ALBUMIN: 2.7 g/dL — AB (ref 3.5–5.0)
ALT: 14 U/L (ref 14–54)
ANION GAP: 10 (ref 5–15)
AST: 22 U/L (ref 15–41)
Alkaline Phosphatase: 39 U/L (ref 38–126)
BILIRUBIN TOTAL: 0.7 mg/dL (ref 0.3–1.2)
BUN: 25 mg/dL — ABNORMAL HIGH (ref 6–20)
CALCIUM: 8.2 mg/dL — AB (ref 8.9–10.3)
CO2: 29 mmol/L (ref 22–32)
Chloride: 98 mmol/L — ABNORMAL LOW (ref 101–111)
Creatinine, Ser: 1.28 mg/dL — ABNORMAL HIGH (ref 0.44–1.00)
GFR calc non Af Amer: 43 mL/min — ABNORMAL LOW (ref >60)
GFR, EST AFRICAN AMERICAN: 50 mL/min — AB (ref >60)
GLUCOSE: 83 mg/dL (ref 65–99)
POTASSIUM: 3.4 mmol/L — AB (ref 3.5–5.1)
Sodium: 137 mmol/L (ref 135–145)
TOTAL PROTEIN: 6.9 g/dL (ref 6.5–8.1)

## 2016-10-30 LAB — MAGNESIUM: MAGNESIUM: 1.6 mg/dL — AB (ref 1.7–2.4)

## 2016-10-30 MED ORDER — INFLUENZA VAC SPLIT HIGH-DOSE 0.5 ML IM SUSY
0.5000 mL | PREFILLED_SYRINGE | INTRAMUSCULAR | Status: AC
  Administered 2016-10-31: 0.5 mL via INTRAMUSCULAR
  Filled 2016-10-30: qty 0.5

## 2016-10-30 MED ORDER — METRONIDAZOLE IN NACL 5-0.79 MG/ML-% IV SOLN
500.0000 mg | Freq: Three times a day (TID) | INTRAVENOUS | Status: DC
  Administered 2016-10-30 – 2016-11-03 (×11): 500 mg via INTRAVENOUS
  Filled 2016-10-30 (×11): qty 100

## 2016-10-30 MED ORDER — MENTHOL 3 MG MT LOZG: 1.0000 | LOZENGE | OROMUCOSAL | Status: DC | PRN

## 2016-10-30 MED ORDER — METRONIDAZOLE IN NACL 5-0.79 MG/ML-% IV SOLN
500.0000 mg | Freq: Three times a day (TID) | INTRAVENOUS | Status: DC
Start: 2016-10-30 — End: 2016-10-30

## 2016-10-30 MED ORDER — POTASSIUM CHLORIDE 10 MEQ/100ML IV SOLN
10.0000 meq | INTRAVENOUS | Status: AC
  Administered 2016-10-30 (×4): 10 meq via INTRAVENOUS
  Filled 2016-10-30 (×2): qty 100

## 2016-10-30 MED ORDER — POTASSIUM CHLORIDE 10 MEQ/100ML IV SOLN
INTRAVENOUS | Status: AC
  Filled 2016-10-30: qty 100

## 2016-10-30 MED ORDER — HYDRALAZINE HCL 20 MG/ML IJ SOLN
10.0000 mg | INTRAMUSCULAR | Status: DC | PRN
  Administered 2016-10-30 – 2016-11-02 (×3): 10 mg via INTRAVENOUS
  Filled 2016-10-30 (×2): qty 1

## 2016-10-30 MED ORDER — CIPROFLOXACIN IN D5W 400 MG/200ML IV SOLN
400.0000 mg | Freq: Two times a day (BID) | INTRAVENOUS | Status: DC
  Filled 2016-10-30: qty 200

## 2016-10-30 MED ORDER — METOPROLOL TARTRATE 5 MG/5ML IV SOLN
5.0000 mg | Freq: Four times a day (QID) | INTRAVENOUS | Status: DC
  Administered 2016-10-30 – 2016-11-02 (×13): 5 mg via INTRAVENOUS
  Filled 2016-10-30 (×13): qty 5

## 2016-10-30 MED ORDER — CIPROFLOXACIN IN D5W 400 MG/200ML IV SOLN
400.0000 mg | Freq: Two times a day (BID) | INTRAVENOUS | Status: DC
  Administered 2016-10-30 – 2016-11-03 (×7): 400 mg via INTRAVENOUS
  Filled 2016-10-30 (×9): qty 200

## 2016-10-30 NOTE — Plan of Care (Signed)
Problem: Pain Managment: Goal: General experience of comfort will improve Outcome: Not Progressing Pt c/o 9/10 abdominal pain and morphine given x1 upon admit. Pt has nGT inserted d/t N/V/pain upon admission. Pt educated on pain mgmt and medications available. Pt verbalized understanding. Will continue to monitor pt

## 2016-10-30 NOTE — Progress Notes (Signed)
PROGRESS NOTE    Cassandra Jordan  AST:[id number] DOB: 1950-11-10 DOA: 10/29/2016 PCP: Lemmie Evens, MD     Brief Narrative:  66 year old woman admitted from home on 10/8 with complaints of abdominal pain. She has had a history of a hysterectomy many years ago as well as a right nephrectomy for renal cell cancer. She has been having right lower quadrant pain with nausea and vomiting. CT abdomen showed a small bowel obstruction with mucosal thickening of the distal ileum and posterior aspect of the cecum. Admission requested.   Assessment & Plan:   Principal Problem:   SBO (small bowel obstruction) (HCC) Active Problems:   Hepatitis C, chronic (HCC)   Essential hypertension   PSVT (paroxysmal supraventricular tachycardia) (HCC)   Diabetes mellitus, type II (HCC)   GERD (gastroesophageal reflux disease)   AKI (acute kidney injury) (HCC)   Leukocytosis   Small bowel obstruction -Status post NG placement, has had no further abdominal pain, nausea or vomiting since admission. -Seen in consultation by Dr. Constance Haw with surgery, she believes that obstruction may be due to colitis and has recommended initiation of antibiotics. -Will start Cipro and Flagyl IV. -Will remain nothing by mouth today. -No NG output, will remove.  Acute renal failure -Likely due to prerenal azotemia, improving with IV fluids  Hypokalemia -Likely due to NG suctioning. Replace orally, check magnesium level.  Chronic hepatitis C -Continue outpatient follow-up with GI.  Essential hypertension -Not at all, all by mouth meds aren't hold. -Continue scheduled metoprolol and when necessary hydralazine.  PSVT -IV metoprolol for now, resume oral once by mouth route is resumed.  GERD -IV PPI.  Type 2 diabetes mellitus -Well-controlled, patient remains nothing by mouth.   DVT prophylaxis: SCDs Code Status: Full code Family Communication: Husband at bedside updated on plan of care and all questions  answered Disposition Plan: Pending resolution of small bowel obstruction and colitis, anticipate 48-72 hours  Consultants:   Surgery  Procedures:   None  Antimicrobials:  Anti-infectives    Start     Dose/Rate Route Frequency Ordered Stop   10/30/16 1645  ciprofloxacin (CIPRO) IVPB 400 mg     400 mg 200 mL/hr over 60 Minutes Intravenous Every 12 hours 10/30/16 1631     10/30/16 1645  metroNIDAZOLE (FLAGYL) IVPB 500 mg     500 mg 100 mL/hr over 60 Minutes Intravenous Every 8 hours 10/30/16 1631         Subjective: Is hungry, has had no further abdominal pain or emesis.  Objective: Vitals:   10/29/16 2059 10/29/16 2347 10/30/16 0037 10/30/16 0632  BP: (!) 186/87 (!) 212/107 (!) 182/87 (!) 192/92  Pulse: 94 91 86 74  Resp: 18 18 20 14   Temp:   98.7 F (37.1 C) 98.9 F (37.2 C)  TempSrc:   Oral Oral  SpO2: 95% 94% 94% 93%  Weight:   62.1 kg (136 lb 14.5 oz)   Height:   5' (1.524 m)     Intake/Output Summary (Last 24 hours) at 10/30/16 1631 Last data filed at 10/30/16 1500  Gross per 24 hour  Intake           581.25 ml  Output             1500 ml  Net          -918.75 ml   Filed Weights   10/29/16 1429 10/30/16 0037  Weight: 63.5 kg (140 lb) 62.1 kg (136 lb 14.5 oz)    Examination:  General exam: Alert, awake, oriented x 3 Respiratory system: Clear to auscultation. Respiratory effort normal. Cardiovascular system:RRR. No murmurs, rubs, gallops. Gastrointestinal system: Abdomen is nondistended, soft and nontender. No organomegaly or masses felt. Normal bowel sounds heard. Central nervous system: Alert and oriented. No focal neurological deficits. Extremities: No C/C/E, +pedal pulses Skin: No rashes, lesions or ulcers Psychiatry: Judgement and insight appear normal. Mood & affect appropriate.     Data Reviewed: I have personally reviewed following labs and imaging studies  CBC:  Recent Labs Lab 10/29/16 1445 10/30/16 0444  WBC 18.9* 14.2*    NEUTROABS  --  7.6  HGB 15.2* 13.6  HCT 46.4* 42.8  MCV 90.6 91.1  PLT 187 481   Basic Metabolic Panel:  Recent Labs Lab 10/29/16 1445 10/30/16 0444  NA 139 137  K 4.7 3.4*  CL 96* 98*  CO2 32 29  GLUCOSE 154* 83  BUN 27* 25*  CREATININE 1.53* 1.28*  CALCIUM 9.2 8.2*   GFR: Estimated Creatinine Clearance: 36 mL/min (A) (by C-G formula based on SCr of 1.28 mg/dL (H)). Liver Function Tests:  Recent Labs Lab 10/29/16 1445 10/30/16 0444  AST 22 22  ALT 17 14  ALKPHOS 44 39  BILITOT 1.1 0.7  PROT 8.1 6.9  ALBUMIN 3.1* 2.7*    Recent Labs Lab 10/29/16 1445  LIPASE 19   No results for input(s): AMMONIA in the last 168 hours. Coagulation Profile: No results for input(s): INR, PROTIME in the last 168 hours. Cardiac Enzymes: No results for input(s): CKTOTAL, CKMB, CKMBINDEX, TROPONINI in the last 168 hours. BNP (last 3 results) No results for input(s): PROBNP in the last 8760 hours. HbA1C: No results for input(s): HGBA1C in the last 72 hours. CBG:  Recent Labs Lab 10/29/16 1434 10/30/16 0040 10/30/16 0629 10/30/16 1140 10/30/16 1547  GLUCAP 145* 108* 79 89 77   Lipid Profile: No results for input(s): CHOL, HDL, LDLCALC, TRIG, CHOLHDL, LDLDIRECT in the last 72 hours. Thyroid Function Tests: No results for input(s): TSH, T4TOTAL, FREET4, T3FREE, THYROIDAB in the last 72 hours. Anemia Panel: No results for input(s): VITAMINB12, FOLATE, FERRITIN, TIBC, IRON, RETICCTPCT in the last 72 hours. Urine analysis:    Component Value Date/Time   COLORURINE YELLOW 10/29/2016 1455   APPEARANCEUR CLEAR 10/29/2016 1455   LABSPEC 1.025 10/29/2016 1455   PHURINE 6.0 10/29/2016 1455   GLUCOSEU 100 (A) 10/29/2016 1455   HGBUR SMALL (A) 10/29/2016 1455   BILIRUBINUR MODERATE (A) 10/29/2016 1455   KETONESUR NEGATIVE 10/29/2016 1455   PROTEINUR >300 (A) 10/29/2016 1455   UROBILINOGEN 0.2 06/30/2014 2000   NITRITE NEGATIVE 10/29/2016 1455   LEUKOCYTESUR NEGATIVE  10/29/2016 1455   Sepsis Labs: @LABRCNTIP (procalcitonin:4,lacticidven:4)  )No results found for this or any previous visit (from the past 240 hour(s)).       Radiology Studies: Ct Abdomen Pelvis Wo Contrast  Result Date: 10/29/2016 CLINICAL DATA:  Right-sided abdominal pain, nausea vomiting and diarrhea. Burning with urination for 3 days. History of renal cancer, post nephrectomy. EXAM: CT ABDOMEN AND PELVIS WITHOUT CONTRAST TECHNIQUE: Multidetector CT imaging of the abdomen and pelvis was performed following the standard protocol without IV contrast. COMPARISON:  Body CT 11/29/2015 FINDINGS: Lower chest: Calcific atherosclerotic disease of the coronary arteries. Calcifications of the aortic valve annulus. Hepatobiliary: No focal liver abnormality is seen. No gallbladder wall thickening. Again noted of layering gallstones with calcifications of the gallbladder wall. No associated biliary ductal dilation. Pancreas: Unremarkable. No pancreatic ductal dilatation or surrounding inflammatory changes. Spleen:  Normal in size without focal abnormality. Adrenals/Urinary Tract: Post right nephrectomy the previously demonstrated probable left renal cyst is not seen likely due to lack of IV contrast. Adrenal glands are normal. Stomach/Bowel: Moderate dilation of small bowel loops in the mid and lower abdomen containing gas fluid levels, with relatively gradual tapering to normal caliber in the distal ileum. Maximum transverse diameter of the dilated small bowel is 3.7 cm. Asymmetric mucosal thickening of the posterior aspect of the cecum, with few scattered wall calcifications, best seen on image 31/82, sequence 2. Mucosal thickening of the distal ileum, best seen on image 48/82, sequence 2. Mesenteric stranding, most prominent in the right mid abdomen. Left colonic diverticulosis. Vascular/Lymphatic: Calcific atherosclerotic disease of the aorta. Numerous shotty mesenteric lymph nodes. Reproductive: Prostate is  unremarkable. Other: Right lateral anterior abdominal wall cutaneous/subcutaneous thickening possibly represents a surgical scarring. There is a subcutaneous soft tissue nodularity within the inferior portion of this abnormality, image 69/82 sequence 2 Musculoskeletal: No acute or significant osseous findings. L5-S1 osteoarthritic changes. IMPRESSION: Post right nephrectomy. Small bowel obstruction with relatively gradual tapering to normal caliber at the level of the distal ileum. Mucosal thickening of the distal ileum and posterior aspect of the cecum, causing stenosis of the distal ileum and likely the cause of the small bowel obstruction. Associated mesenteric stranding and mesenteric lymph node enlargement. These findings may represent infectious colitis, however metastatic deposits or primary malignancy cannot be excluded. Right lateral anterior abdominal wall cutaneous/subcutaneous thickening, possibly representing a surgical scarring with subcutaneous soft tissue nodularity of its inferior portion. Subcutaneous metastatic deposits can't be excluded with this appearance. Cholelithiasis with associated gallbladder wall calcifications. Consider surgical consultation, as gallbladder wall calcifications may be a sign of malignancy. Calcific atherosclerotic disease of the aorta. These results were called by telephone at the time of interpretation on 10/29/2016 at 10:03 pm to Dr. Noemi Chapel , who verbally acknowledged these results. Electronically Signed   By: Fidela Salisbury M.D.   On: 10/29/2016 22:03   Dg Chest Portable 1 View  Result Date: 10/29/2016 CLINICAL DATA:  Nasogastric tube placement, history hypertension, asthma, diabetes mellitus, former smoker EXAM: PORTABLE CHEST 1 VIEW COMPARISON:  Portable exam 4765 hours compared 01/27/2016 FINDINGS: Nasogastric tube extends into stomach. Upper normal heart size. Mediastinal contours and pulmonary vascularity normal. Mild RIGHT basilar atelectasis.  Lungs otherwise clear. No pleural effusion or pneumothorax. IMPRESSION: Nasogastric tube projects over proximal stomach. RIGHT basilar atelectasis. Electronically Signed   By: Lavonia Dana M.D.   On: 10/29/2016 23:40   Dg Abd 2 Views  Result Date: 10/30/2016 CLINICAL DATA:  Lower abdominal pain. History of renal malignancy, small bowel obstruction demonstrated on CT scan yesterday. EXAM: ABDOMEN - 2 VIEW COMPARISON:  Abdominopelvic CT scan of October 29, 2016 FINDINGS: There are loops of minimally distended gas-filled small bowel in the mid and lower abdomen. There is gas and stool within normal caliber colon. No free extraluminal gas collections are observed. The esophagogastric tube tip and proximal port lie in the gastric body. No free extraluminal gas collections are observed. IMPRESSION: Decreased distention of small-bowel loops but persistent ileus or partial obstruction is present. No evidence of perforation. Electronically Signed   By: David  Martinique M.D.   On: 10/30/2016 08:08        Scheduled Meds: . [START ON 10/31/2016] Influenza vac split quadrivalent PF  0.5 mL Intramuscular Tomorrow-1000  . metoprolol tartrate  5 mg Intravenous Q6H  . pantoprazole (PROTONIX) IV  40 mg Intravenous Q24H  Continuous Infusions: . sodium chloride 125 mL/hr at 10/30/16 0651  . ciprofloxacin    . metronidazole       LOS: 1 day    Time spent: 35 minutes. Greater than 50% of this time was spent in direct contact with the patient coordinating care.     Lelon Frohlich, MD [facility] Pager [phone]  If 7PM-7AM, please contact night-coverage [email or web address] Password [email or web address] 10/30/2016, 4:31 PM

## 2016-10-30 NOTE — Consult Note (Signed)
[facility] Consult  Reason for Consult: SBO Referring Physician: Dr. Jerilee Hoh   Chief Complaint    Abdominal Pain; Emesis      Cassandra Jordan is a 66 y.o. female.  HPI: Cassandra Jordan is a very pleasant 67 yo lady with multiple medical issues and a history of right nephrectomy for renal cell cancer.  She presented to the ED with RLQ pain and nausea and vomiting. She reports that she has constipation at baseline but has had multiple BMs yesterday.  She is unsure of when she has passed flatus. The pain is constant and located in the RLQ and near her prior nephrectomy scar. She had a colonoscopy in 2017 with a tubular adenoma removed with repeat scheduled for 2022. She has never had any reported issues with Crohn's or colitis. Pain medications have helped the pain but nothing has relieved the pain.  She had never had a bowel obstruction prior to this episode.   Past Medical History:  Diagnosis Date  . Arthritis   . Asthma   . Cocaine abuse ([place])   . Essential hypertension   . GERD (gastroesophageal reflux disease)   . Hepatitis C   . Hyperlipidemia   . Lichen sclerosus et atrophicus 10/13/2014  . Neuropathic pain   . PSVT (paroxysmal supraventricular tachycardia) ([place])   . Rash   . Renal carcinoma ([place])   . Type 2 diabetes mellitus ([place])   . Vaginal irritation 10/01/2014  . Vaginal itching 10/01/2014  . Yeast infection of the vagina 10/01/2014    Past Surgical History:  Procedure Laterality Date  . ABDOMINAL HYSTERECTOMY    . COLONOSCOPY N/A 07/08/2013   RMR: Multiple colonic polyps treated [name] as described above. Inadequate prepartation comprimised examination there colonic diverticulosis. tubular and tubulovillous adenomas. next TCS 12/2013  . COLONOSCOPY N/A 02/09/2015   RMR: Colonic polyp removed as described above. Tubular adenoma. Next colonoscopy January 2022.  . ESOPHAGOGASTRODUODENOSCOPY N/A 07/08/2013   RMR: Schatzki's ring ; small hiatal hernia-status  post passage of a Maloney dilator  . MALONEY DILATION N/A 07/08/2013   Procedure: [name] DILATION;  Surgeon: [name], MD;  Location: [facility]ITE;  Service: Endoscopy;  Laterality: N/A;  . ROBOT ASSISTED LAPAROSCOPIC NEPHRECTOMY Right 10/14/2013   Procedure: ROBOTIC ASSISTED LAPAROSCOPIC RADICAL NEPHRECTOMY;  Surgeon: [name], MD;  Location: [facility];  Service: Urology;  Laterality: Right;  . SAVORY DILATION N/A 07/08/2013   Procedure: SAVORY DILATION;  Surgeon: [name], MD;  Location: [facility];  Service: Endoscopy;  Laterality: N/A;  . TONSILLECTOMY    . Wart removal      Family History  Problem Relation Age of Onset  . Colon cancer Sister 14  . Cancer Mother   . Heart attack Father   . Hypertension Daughter   . Cancer Maternal Grandfather   . Diabetes Paternal Grandmother     Social History  Substance Use Topics  . Smoking status: Former Smoker    Packs/day: 0.10    Years: 40.00    Types: Cigarettes  . Smokeless tobacco: Never Used  . Alcohol use No     Comment: rarely    Medications:  I have reviewed the patient's current medications. Prior to Admission:  Prescriptions Prior to Admission  Medication Sig Dispense Refill Last Dose  . amiodarone (PACERONE) 100 MG tablet TAKE ONE TABLET BY MOUTH ONCE DAILY - 90 tablet 2 10/29/2016 at Unknown time  . cloNIDine (CATAPRES) 0.1 MG tablet Take 2 tablets (0.2  mg total) by mouth 2 (two) times daily. 30 tablet 0 10/29/2016 at Unknown time  . gabapentin (NEURONTIN) 300 MG capsule Take 1 capsule (300 mg total) by mouth 2 (two) times daily. (Patient taking differently: Take 600 mg by mouth 3 (three) times daily. ) 20 capsule 0 10/29/2016 at Unknown time  . hydrochlorothiazide (HYDRODIURIL) 12.5 MG tablet Take 1 tablet by mouth daily.   10/29/2016 at Unknown time  . insulin detemir (LEVEMIR) 100 UNIT/ML injection Inject 3 Units into the skin 3 (three) times daily.    10/29/2016 at Unknown time  .  lisinopril-hydrochlorothiazide (PRINZIDE,ZESTORETIC) 20-25 MG tablet Take 1 tablet by mouth 2 (two) times daily.    10/29/2016 at Unknown time  . LORazepam (ATIVAN) 1 MG tablet Take 1 mg by mouth every 8 (eight) hours.   unknown  . Melatonin 1 MG CAPS Take 1 capsule (1 mg total) by mouth at bedtime as needed (sleep). (Patient taking differently: Take 3 mg by mouth at bedtime as needed (sleep). ) 30 capsule 0 10/28/2016 at Unknown time  . metoprolol succinate (TOPROL-XL) 50 MG 24 hr tablet Take 50 mg by mouth 2 (two) times daily.    10/29/2016  . pantoprazole (PROTONIX) 40 MG tablet Take 1 tablet (40 mg total) by mouth daily. Take 30 minutes before breakfast 90 tablet 3 10/29/2016 at Unknown time  . potassium chloride SA (K-DUR,KLOR-CON) 20 MEQ tablet Take 1 tablet (20 mEq total) by mouth 2 (two) times daily. 10 tablet 0 10/29/2016 at Unknown time  . cephALEXin (KEFLEX) 500 MG capsule Take 1 capsule (500 mg total) by mouth 4 (four) times daily. (Patient not taking: Reported on 10/29/2016) 28 capsule 0 Not Taking at Unknown time  . ciprofloxacin (CIPRO) 500 MG tablet Take 1 tablet (500 mg total) by mouth 2 (two) times daily. (Patient not taking: Reported on 10/29/2016) 14 tablet 0 Not Taking at Unknown time  . metroNIDAZOLE (FLAGYL) 500 MG tablet Take 1 tablet (500 mg total) by mouth 3 (three) times daily. (Patient not taking: Reported on 10/29/2016) 21 tablet 0 Not Taking at Unknown time   Scheduled: . [START ON 10/31/2016] Influenza vac split quadrivalent PF  0.5 mL Intramuscular Tomorrow-1000  . metoprolol tartrate  5 mg Intravenous Q6H  . pantoprazole (PROTONIX) IV  40 mg Intravenous Q24H   Continuous: . sodium chloride 125 mL/hr at 10/30/16 0651   HCW:[id number], morphine injection, ondansetron **OR** ondansetron (ZOFRAN) IV   Allergies  Allergen Reactions  . Sulfa Antibiotics Itching and Other (See Comments)    burning     ROS:  A comprehensive review of systems was negative except for:  Gastrointestinal: positive for abdominal pain, change in bowel habits, nausea and vomiting Genitourinary: positive for renal cell cancer  Blood pressure (!) 192/92, pulse 74, temperature 98.9 F (37.2 C), temperature source Oral, resp. rate 14, height 5' (1.524 m), weight 136 lb 14.5 oz (62.1 kg), SpO2 93 %. Physical Exam  Results: Results for orders placed or performed during the hospital encounter of 10/29/16 (from the past 48 hour(s))  CBG monitoring, ED     Status: Abnormal   Collection Time: 10/29/16  2:34 PM  Result Value Ref Range   Glucose-Capillary 145 (H) 65 - 99 mg/dL  Lipase, blood     Status: None   Collection Time: 10/29/16  2:45 PM  Result Value Ref Range   Lipase 19 11 - 51 U/L  Comprehensive metabolic panel     Status: Abnormal   Collection Time: 10/29/16  2:45 PM  Result Value Ref Range   Sodium 139 135 - 145 mmol/L   Potassium 4.7 3.5 - 5.1 mmol/L   Chloride 96 (L) 101 - 111 mmol/L   CO2 32 22 - 32 mmol/L   Glucose, Bld 154 (H) 65 - 99 mg/dL   BUN 27 (H) 6 - 20 mg/dL   Creatinine, Ser 1.53 (H) 0.44 - 1.00 mg/dL   Calcium 9.2 8.9 - 10.3 mg/dL   Total Protein 8.1 6.5 - 8.1 g/dL   Albumin 3.1 (L) 3.5 - 5.0 g/dL   AST 22 15 - 41 U/L   ALT 17 14 - 54 U/L   Alkaline Phosphatase 44 38 - 126 U/L   Total Bilirubin 1.1 0.3 - 1.2 mg/dL   GFR calc non Af Amer 35 (L) >60 mL/min   GFR calc Af Amer 40 (L) >60 mL/min    Comment: (NOTE) The eGFR has been calculated using the CKD EPI equation. This calculation has not been validated in all clinical situations. eGFR's persistently <60 mL/min signify possible Chronic Kidney Disease.    Anion gap 11 5 - 15  CBC     Status: Abnormal   Collection Time: 10/29/16  2:45 PM  Result Value Ref Range   WBC 18.9 (H) 4.0 - 10.5 K/uL   RBC 5.12 (H) 3.87 - 5.11 MIL/uL   Hemoglobin 15.2 (H) 12.0 - 15.0 g/dL   HCT 46.4 (H) 36.0 - 46.0 %   MCV 90.6 78.0 - 100.0 fL   MCH 29.7 26.0 - 34.0 pg   MCHC 32.8 30.0 - 36.0 g/dL   RDW 12.7  11.5 - 15.5 %   Platelets 187 150 - 400 K/uL  Urinalysis, Routine w reflex microscopic     Status: Abnormal   Collection Time: 10/29/16  2:55 PM  Result Value Ref Range   Color, Urine YELLOW YELLOW   APPearance CLEAR CLEAR   Specific Gravity, Urine 1.025 1.005 - 1.030   pH 6.0 5.0 - 8.0   Glucose, UA 100 (A) NEGATIVE mg/dL   Hgb urine dipstick SMALL (A) NEGATIVE   Bilirubin Urine MODERATE (A) NEGATIVE   Ketones, ur NEGATIVE NEGATIVE mg/dL   Protein, ur >300 (A) NEGATIVE mg/dL   Nitrite NEGATIVE NEGATIVE   Leukocytes, UA NEGATIVE NEGATIVE  Urinalysis, Microscopic (reflex)     Status: Abnormal   Collection Time: 10/29/16  2:55 PM  Result Value Ref Range   RBC / HPF 0-5 0 - 5 RBC/hpf   WBC, UA 0-5 0 - 5 WBC/hpf   Bacteria, UA MANY (A) NONE SEEN   Squamous Epithelial / LPF 0-5 (A) NONE SEEN   Trichomonas, UA PRESENT    Hyaline Casts, UA PRESENT   Glucose, capillary     Status: Abnormal   Collection Time: 10/30/16 12:40 AM  Result Value Ref Range   Glucose-Capillary 108 (H) 65 - 99 mg/dL   Comment 1 Notify RN    Comment 2 Document in Chart   CBC WITH DIFFERENTIAL     Status: Abnormal   Collection Time: 10/30/16  4:44 AM  Result Value Ref Range   WBC 14.2 (H) 4.0 - 10.5 K/uL   RBC 4.70 3.87 - 5.11 MIL/uL   Hemoglobin 13.6 12.0 - 15.0 g/dL   HCT 42.8 36.0 - 46.0 %   MCV 91.1 78.0 - 100.0 fL   MCH 28.9 26.0 - 34.0 pg   MCHC 31.8 30.0 - 36.0 g/dL   RDW 12.8 11.5 - 15.5 %  Platelets 190 150 - 400 K/uL   Neutrophils Relative % 54 %   Neutro Abs 7.6 1.7 - 7.7 K/uL   Lymphocytes Relative 36 %   Lymphs Abs 5.2 (H) 0.7 - 4.0 K/uL   Monocytes Relative 9 %   Monocytes Absolute 1.2 (H) 0.1 - 1.0 K/uL   Eosinophils Relative 1 %   Eosinophils Absolute 0.2 0.0 - 0.7 K/uL   Basophils Relative 0 %   Basophils Absolute 0.0 0.0 - 0.1 K/uL  Comprehensive metabolic panel     Status: Abnormal   Collection Time: 10/30/16  4:44 AM  Result Value Ref Range   Sodium 137 135 - 145 mmol/L    Potassium 3.4 (L) 3.5 - 5.1 mmol/L    Comment: DELTA CHECK NOTED   Chloride 98 (L) 101 - 111 mmol/L   CO2 29 22 - 32 mmol/L   Glucose, Bld 83 65 - 99 mg/dL   BUN 25 (H) 6 - 20 mg/dL   Creatinine, Ser 1.28 (H) 0.44 - 1.00 mg/dL   Calcium 8.2 (L) 8.9 - 10.3 mg/dL   Total Protein 6.9 6.5 - 8.1 g/dL   Albumin 2.7 (L) 3.5 - 5.0 g/dL   AST 22 15 - 41 U/L   ALT 14 14 - 54 U/L   Alkaline Phosphatase 39 38 - 126 U/L   Total Bilirubin 0.7 0.3 - 1.2 mg/dL   GFR calc non Af Amer 43 (L) >60 mL/min   GFR calc Af Amer 50 (L) >60 mL/min    Comment: (NOTE) The eGFR has been calculated using the CKD EPI equation. This calculation has not been validated in all clinical situations. eGFR's persistently <60 mL/min signify possible Chronic Kidney Disease.    Anion gap 10 5 - 15  Glucose, capillary     Status: None   Collection Time: 10/30/16  6:29 AM  Result Value Ref Range   Glucose-Capillary 79 65 - 99 mg/dL  Glucose, capillary     Status: None   Collection Time: 10/30/16 11:40 AM  Result Value Ref Range   Glucose-Capillary 89 65 - 99 mg/dL    Personally reviewed CT scan- some dilated bowel to tapered bowel but no discrete transition, thickened ileum and cecum but difficult to assess given non contrasted CT scan   Ct Abdomen Pelvis Wo Contrast  Result Date: 10/29/2016 CLINICAL DATA:  Right-sided abdominal pain, nausea vomiting and diarrhea. Burning with urination for 3 days. History of renal cancer, post nephrectomy. EXAM: CT ABDOMEN AND PELVIS WITHOUT CONTRAST TECHNIQUE: Multidetector CT imaging of the abdomen and pelvis was performed following the standard protocol without IV contrast. COMPARISON:  Body CT 11/29/2015 FINDINGS: Lower chest: Calcific atherosclerotic disease of the coronary arteries. Calcifications of the aortic valve annulus. Hepatobiliary: No focal liver abnormality is seen. No gallbladder wall thickening. Again noted of layering gallstones with calcifications of the gallbladder  wall. No associated biliary ductal dilation. Pancreas: Unremarkable. No pancreatic ductal dilatation or surrounding inflammatory changes. Spleen: Normal in size without focal abnormality. Adrenals/Urinary Tract: Post right nephrectomy the previously demonstrated probable left renal cyst is not seen likely due to lack of IV contrast. Adrenal glands are normal. Stomach/Bowel: Moderate dilation of small bowel loops in the mid and lower abdomen containing gas fluid levels, with relatively gradual tapering to normal caliber in the distal ileum. Maximum transverse diameter of the dilated small bowel is 3.7 cm. Asymmetric mucosal thickening of the posterior aspect of the cecum, with few scattered wall calcifications, best seen on image 31/82,  sequence 2. Mucosal thickening of the distal ileum, best seen on image 48/82, sequence 2. Mesenteric stranding, most prominent in the right mid abdomen. Left colonic diverticulosis. Vascular/Lymphatic: Calcific atherosclerotic disease of the aorta. Numerous shotty mesenteric lymph nodes. Reproductive: Prostate is unremarkable. Other: Right lateral anterior abdominal wall cutaneous/subcutaneous thickening possibly represents a surgical scarring. There is a subcutaneous soft tissue nodularity within the inferior portion of this abnormality, image 69/82 sequence 2 Musculoskeletal: No acute or significant osseous findings. L5-S1 osteoarthritic changes. IMPRESSION: Post right nephrectomy. Small bowel obstruction with relatively gradual tapering to normal caliber at the level of the distal ileum. Mucosal thickening of the distal ileum and posterior aspect of the cecum, causing stenosis of the distal ileum and likely the cause of the small bowel obstruction. Associated mesenteric stranding and mesenteric lymph node enlargement. These findings may represent infectious colitis, however metastatic deposits or primary malignancy cannot be excluded. Right lateral anterior abdominal wall  cutaneous/subcutaneous thickening, possibly representing a surgical scarring with subcutaneous soft tissue nodularity of its inferior portion. Subcutaneous metastatic deposits can't be excluded with this appearance. Cholelithiasis with associated gallbladder wall calcifications. Consider surgical consultation, as gallbladder wall calcifications may be a sign of malignancy. Calcific atherosclerotic disease of the aorta. These results were called by telephone at the time of interpretation on 10/29/2016 at 10:03 pm to Dr. Noemi Chapel , who verbally acknowledged these results. Electronically Signed   By: Fidela Salisbury M.D.   On: 10/29/2016 22:03   Dg Chest Portable 1 View  Result Date: 10/29/2016 CLINICAL DATA:  Nasogastric tube placement, history hypertension, asthma, diabetes mellitus, former smoker EXAM: PORTABLE CHEST 1 VIEW COMPARISON:  Portable exam 6546 hours compared 01/27/2016 FINDINGS: Nasogastric tube extends into stomach. Upper normal heart size. Mediastinal contours and pulmonary vascularity normal. Mild RIGHT basilar atelectasis. Lungs otherwise clear. No pleural effusion or pneumothorax. IMPRESSION: Nasogastric tube projects over proximal stomach. RIGHT basilar atelectasis. Electronically Signed   By: Lavonia Dana M.D.   On: 10/29/2016 23:40   Dg Abd 2 Views  Result Date: 10/30/2016 CLINICAL DATA:  Lower abdominal pain. History of renal malignancy, small bowel obstruction demonstrated on CT scan yesterday. EXAM: ABDOMEN - 2 VIEW COMPARISON:  Abdominopelvic CT scan of October 29, 2016 FINDINGS: There are loops of minimally distended gas-filled small bowel in the mid and lower abdomen. There is gas and stool within normal caliber colon. No free extraluminal gas collections are observed. The esophagogastric tube tip and proximal port lie in the gastric body. No free extraluminal gas collections are observed. IMPRESSION: Decreased distention of small-bowel loops but persistent ileus or partial  obstruction is present. No evidence of perforation. Electronically Signed   By: David  Martinique M.D.   On: 10/30/2016 08:08     Assessment & Plan:  JAMILLE FISHER is a 66 y.o. female with thickening of the terminal ileum and cecum with some degree of partial obstruction with dilated bowel proximally but gas and stool in colon distally. She reports having had some BMs yesterday.  She is not regular with her BMs normally, but cannot tell me how frequently she has a BM.  She has had a colonoscopy in 2017 that removed a polyp.  She also has WBC on admission and no leukocytes or nitrites on her UA.  -Given the location and findings with thickening but ability to delineate is limited because of non contrasted CT, favor a colitis causing this swelling and some degree of obstruction -Patient's stomach is decompressed on the CT and Xray from  this AM with minimal non specific dilation -Would leave NPO, treat colitis with antibiotics, if not vomiting can take out NG tube as the stomach is not distended  -No acute surgical intervention, will continue to follow  -RLQ subcutaneous thickening, nodularity, should get referred back to urology regarding question of scarring versus recurrence   All questions were answered to the satisfaction of the patient.   Virl Cagey 10/30/2016, 11:53 AM

## 2016-10-30 NOTE — Progress Notes (Signed)
Dr. Olevia Bowens made aware of Pts SBP 182, no PRN medications ordered and pt is NPO. RN asked for IV PRN BP medications. Waiting for call back/orders.

## 2016-10-31 DIAGNOSIS — B182 Chronic viral hepatitis C: Secondary | ICD-10-CM

## 2016-10-31 DIAGNOSIS — K219 Gastro-esophageal reflux disease without esophagitis: Secondary | ICD-10-CM

## 2016-10-31 DIAGNOSIS — N179 Acute kidney failure, unspecified: Secondary | ICD-10-CM

## 2016-10-31 DIAGNOSIS — I10 Essential (primary) hypertension: Secondary | ICD-10-CM

## 2016-10-31 LAB — GLUCOSE, CAPILLARY
GLUCOSE-CAPILLARY: 138 mg/dL — AB (ref 65–99)
GLUCOSE-CAPILLARY: 76 mg/dL (ref 65–99)
Glucose-Capillary: 71 mg/dL (ref 65–99)
Glucose-Capillary: 107 mg/dL — ABNORMAL HIGH (ref 65–99)
Glucose-Capillary: 45 mg/dL — ABNORMAL LOW (ref 65–99)
Glucose-Capillary: 127 mg/dL — ABNORMAL HIGH (ref 65–99)
Glucose-Capillary: 83 mg/dL (ref 65–99)

## 2016-10-31 LAB — CBC WITH DIFFERENTIAL/PLATELET
BASOS ABS: 0 10*3/uL (ref 0.0–0.1)
Basophils Relative: 0 %
EOS PCT: 2 %
Eosinophils Absolute: 0.3 10*3/uL (ref 0.0–0.7)
HCT: 41.4 % (ref 36.0–46.0)
Hemoglobin: 13.4 g/dL (ref 12.0–15.0)
LYMPHS PCT: 28 %
Lymphs Abs: 3.1 10*3/uL (ref 0.7–4.0)
MCH: 28.9 pg (ref 26.0–34.0)
MCHC: 32.4 g/dL (ref 30.0–36.0)
MCV: 89.2 fL (ref 78.0–100.0)
MONO ABS: 1.2 10*3/uL — AB (ref 0.1–1.0)
MONOS PCT: 11 %
Neutro Abs: 6.3 10*3/uL (ref 1.7–7.7)
Neutrophils Relative %: 59 %
PLATELETS: 216 10*3/uL (ref 150–400)
RBC: 4.64 MIL/uL (ref 3.87–5.11)
RDW: 12.4 % (ref 11.5–15.5)
WBC: 10.8 10*3/uL — ABNORMAL HIGH (ref 4.0–10.5)

## 2016-10-31 LAB — COMPREHENSIVE METABOLIC PANEL
ALT: 13 U/L — ABNORMAL LOW (ref 14–54)
ANION GAP: 10 (ref 5–15)
AST: 24 U/L (ref 15–41)
Albumin: 2.4 g/dL — ABNORMAL LOW (ref 3.5–5.0)
Alkaline Phosphatase: 33 U/L — ABNORMAL LOW (ref 38–126)
BILIRUBIN TOTAL: 0.9 mg/dL (ref 0.3–1.2)
BUN: 16 mg/dL (ref 6–20)
CHLORIDE: 100 mmol/L — AB (ref 101–111)
CO2: 26 mmol/L (ref 22–32)
Calcium: 8.2 mg/dL — ABNORMAL LOW (ref 8.9–10.3)
Creatinine, Ser: 1.03 mg/dL — ABNORMAL HIGH (ref 0.44–1.00)
GFR calc non Af Amer: 56 mL/min — ABNORMAL LOW (ref >60)
Glucose, Bld: 72 mg/dL (ref 65–99)
POTASSIUM: 3.8 mmol/L (ref 3.5–5.1)
Sodium: 136 mmol/L (ref 135–145)
TOTAL PROTEIN: 6.5 g/dL (ref 6.5–8.1)

## 2016-10-31 LAB — HIV ANTIBODY (ROUTINE TESTING W REFLEX): HIV SCREEN 4TH GENERATION: NONREACTIVE

## 2016-10-31 MED ORDER — DEXTROSE 50 % IV SOLN
INTRAVENOUS | Status: AC
  Administered 2016-10-31: 50 mL
  Filled 2016-10-31: qty 50

## 2016-10-31 MED ORDER — HYDRALAZINE HCL 20 MG/ML IJ SOLN
5.0000 mg | Freq: Three times a day (TID) | INTRAMUSCULAR | Status: DC
  Administered 2016-11-01 – 2016-11-02 (×3): 5 mg via INTRAVENOUS
  Filled 2016-10-31 (×5): qty 1

## 2016-10-31 MED ORDER — MORPHINE SULFATE (PF) 2 MG/ML IV SOLN
2.0000 mg | INTRAVENOUS | Status: DC | PRN
  Administered 2016-10-31 – 2016-11-01 (×6): 2 mg via INTRAVENOUS
  Filled 2016-10-31 (×6): qty 1

## 2016-10-31 NOTE — Progress Notes (Signed)
PROGRESS NOTE  Cassandra Jordan QMV:[id number] DOB: [birth year] DOA: 10/29/2016 PCP: Lemmie Evens, MD  Brief History:  66 year old woman admitted from home on 10/8 with complaints of abdominal pain. She has had a history of a hysterectomy many years ago as well as a right nephrectomy for renal cell cancer. She has been having right lower quadrant pain with nausea and vomiting. CT abdomen showed a small bowel obstruction with mucosal thickening of the distal ileum and posterior aspect of the cecum.  NG was placed and General surgery was consulted to assist.  Assessment/Plan: Small bowel obstruction -Status post NG placement, has had no further abdominal pain, nausea or vomiting since admission. -10/29/2016 CT abdomen and pelvis--mucosal thickening of the distal ileum and cecum causing stenosis in the distal ileum--likely the cause of SBO; cholelithiasis with gallbladder wall calcifications  -Seen in consultation by Dr. Constance Haw with surgery, she believes that obstruction may be due to colitis;   -Continue Cipro and Flagyl IV-->WBC improving -ice chips only -NG removed  Infectious colitis -continue cipro and flagyl  AKI -due to volume depletion -baseline creatinine 0.9-1.1 -serum creatinine peaked 1.53 -improving with IVF  Hypokalemia -replete -check mag  Nodular thickening Abd Wall -follow up with urology  Chronic hepatitis C -Continue outpatient follow-up with GI.  Essential hypertension -continue IV lopressor -add IV hydralazine  PSVT -IV metoprolol for now, resume oral once by mouth route is resumed.  GERD -IV PPI.  Type 2 diabetes mellitus -Well-controlled, patient remains nothing by mouth.   Disposition Plan:   Home in 2-3 days  Family Communication:   Family at bedside updated 10/10  Consultants:  General surgery  Code Status:  FULL  DVT Prophylaxis:  [place] Heparin / [place] Lovenox   Procedures: As Listed in Progress Note  Above  Antibiotics: None    Subjective: Patient denies fevers, chills, headache, chest pain, dyspnea, nausea, vomiting, diarrhea, abdominal pain, dysuria, hematuria, hematochezia, and melena. Feel hunger  Objective: Vitals:   10/30/16 1923 10/30/16 2044 10/31/16 0625 10/31/16 1300  BP:  (!) 232/93 (!) 183/75 (!) 209/71  Pulse:  (!) 59 63 62  Resp:  16 15 16   Temp:  98.3 F (36.8 C) 98.2 F (36.8 C) 98.7 F (37.1 C)  TempSrc:  Oral Oral Oral  SpO2: 92% 92% 97% 98%  Weight:      Height:        Intake/Output Summary (Last 24 hours) at 10/31/16 1755 Last data filed at 10/31/16 1515  Gross per 24 hour  Intake          4052.92 ml  Output              600 ml  Net          3452.92 ml   Weight change:  Exam:   General:  Pt is alert, follows commands appropriately, not in acute distress  HEENT: No icterus, No thrush, No neck mass, [place]/AT  Cardiovascular: RRR, S1/S2, no rubs, no gallops  Respiratory: CTA bilaterally, no wheezing, no crackles, no rhonchi  Abdomen: Soft/+BS, non tender, non distended, no guarding  Extremities: No edema, No lymphangitis, No petechiae, No rashes, no synovitis   Data Reviewed: I have personally reviewed following labs and imaging studies Basic Metabolic Panel:  Recent Labs Lab 10/29/16 1445 10/30/16 0444 10/30/16 1400 10/31/16 0540  NA 139 137  --  136  K 4.7 3.4*  --  3.8  CL 96* 98*  --  100*  CO2 32 29  --  26  GLUCOSE 154* 83  --  72  BUN 27* 25*  --  16  CREATININE 1.53* 1.28*  --  1.03*  CALCIUM 9.2 8.2*  --  8.2*  MG  --   --  1.6*  --    Liver Function Tests:  Recent Labs Lab 10/29/16 1445 10/30/16 0444 10/31/16 0540  AST 22 22 24   ALT 17 14 13*  ALKPHOS 44 39 33*  BILITOT 1.1 0.7 0.9  PROT 8.1 6.9 6.5  ALBUMIN 3.1* 2.7* 2.4*    Recent Labs Lab 10/29/16 1445  LIPASE 19   No results for input(s): AMMONIA in the last 168 hours. Coagulation Profile: No results for input(s): INR, PROTIME in the last 168  hours. CBC:  Recent Labs Lab 10/29/16 1445 10/30/16 0444 10/31/16 0540  WBC 18.9* 14.2* 10.8*  NEUTROABS  --  7.6 6.3  HGB 15.2* 13.6 13.4  HCT 46.4* 42.8 41.4  MCV 90.6 91.1 89.2  PLT 187 190 216   Cardiac Enzymes: No results for input(s): CKTOTAL, CKMB, CKMBINDEX, TROPONINI in the last 168 hours. BNP: Invalid input(s): POCBNP CBG:  Recent Labs Lab 10/31/16 0310 10/31/16 0602 10/31/16 0843 10/31/16 1013 10/31/16 1138  GLUCAP 76 71 107* 45* 127*   HbA1C: No results for input(s): HGBA1C in the last 72 hours. Urine analysis:    Component Value Date/Time   COLORURINE YELLOW 10/29/2016 1455   APPEARANCEUR CLEAR 10/29/2016 1455   LABSPEC 1.025 10/29/2016 1455   PHURINE 6.0 10/29/2016 1455   GLUCOSEU 100 (A) 10/29/2016 1455   HGBUR SMALL (A) 10/29/2016 1455   BILIRUBINUR MODERATE (A) 10/29/2016 1455   KETONESUR NEGATIVE 10/29/2016 1455   PROTEINUR >300 (A) 10/29/2016 1455   UROBILINOGEN 0.2 06/30/2014 2000   NITRITE NEGATIVE 10/29/2016 1455   LEUKOCYTESUR NEGATIVE 10/29/2016 1455   Sepsis Labs: @LABRCNTIP (procalcitonin:4,lacticidven:4) )No results found for this or any previous visit (from the past 240 hour(s)).   Scheduled Meds: . metoprolol tartrate  5 mg Intravenous Q6H  . pantoprazole (PROTONIX) IV  40 mg Intravenous Q24H   Continuous Infusions: . sodium chloride 125 mL/hr at 10/30/16 2044  . ciprofloxacin Stopped (10/31/16 0731)  . metronidazole 500 mg (10/31/16 1205)    Procedures/Studies: Ct Abdomen Pelvis Wo Contrast  Result Date: 10/29/2016 CLINICAL DATA:  Right-sided abdominal pain, nausea vomiting and diarrhea. Burning with urination for 3 days. History of renal cancer, post nephrectomy. EXAM: CT ABDOMEN AND PELVIS WITHOUT CONTRAST TECHNIQUE: Multidetector CT imaging of the abdomen and pelvis was performed following the standard protocol without IV contrast. COMPARISON:  Body CT 11/29/2015 FINDINGS: Lower chest: Calcific atherosclerotic disease of  the coronary arteries. Calcifications of the aortic valve annulus. Hepatobiliary: No focal liver abnormality is seen. No gallbladder wall thickening. Again noted of layering gallstones with calcifications of the gallbladder wall. No associated biliary ductal dilation. Pancreas: Unremarkable. No pancreatic ductal dilatation or surrounding inflammatory changes. Spleen: Normal in size without focal abnormality. Adrenals/Urinary Tract: Post right nephrectomy the previously demonstrated probable left renal cyst is not seen likely due to lack of IV contrast. Adrenal glands are normal. Stomach/Bowel: Moderate dilation of small bowel loops in the mid and lower abdomen containing gas fluid levels, with relatively gradual tapering to normal caliber in the distal ileum. Maximum transverse diameter of the dilated small bowel is 3.7 cm. Asymmetric mucosal thickening of the posterior aspect of the cecum, with few scattered wall calcifications, best seen on image 31/82, sequence 2. Mucosal thickening of  the distal ileum, best seen on image 48/82, sequence 2. Mesenteric stranding, most prominent in the right mid abdomen. Left colonic diverticulosis. Vascular/Lymphatic: Calcific atherosclerotic disease of the aorta. Numerous shotty mesenteric lymph nodes. Reproductive: Prostate is unremarkable. Other: Right lateral anterior abdominal wall cutaneous/subcutaneous thickening possibly represents a surgical scarring. There is a subcutaneous soft tissue nodularity within the inferior portion of this abnormality, image 69/82 sequence 2 Musculoskeletal: No acute or significant osseous findings. L5-S1 osteoarthritic changes. IMPRESSION: Post right nephrectomy. Small bowel obstruction with relatively gradual tapering to normal caliber at the level of the distal ileum. Mucosal thickening of the distal ileum and posterior aspect of the cecum, causing stenosis of the distal ileum and likely the cause of the small bowel obstruction. Associated  mesenteric stranding and mesenteric lymph node enlargement. These findings may represent infectious colitis, however metastatic deposits or primary malignancy cannot be excluded. Right lateral anterior abdominal wall cutaneous/subcutaneous thickening, possibly representing a surgical scarring with subcutaneous soft tissue nodularity of its inferior portion. Subcutaneous metastatic deposits can't be excluded with this appearance. Cholelithiasis with associated gallbladder wall calcifications. Consider surgical consultation, as gallbladder wall calcifications may be a sign of malignancy. Calcific atherosclerotic disease of the aorta. These results were called by telephone at the time of interpretation on 10/29/2016 at 10:03 pm to Dr. Noemi Chapel , who verbally acknowledged these results. Electronically Signed   By: Fidela Salisbury M.D.   On: 10/29/2016 22:03   Dg Chest Portable 1 View  Result Date: 10/29/2016 CLINICAL DATA:  Nasogastric tube placement, history hypertension, asthma, diabetes mellitus, former smoker EXAM: PORTABLE CHEST 1 VIEW COMPARISON:  Portable exam 0349 hours compared 01/27/2016 FINDINGS: Nasogastric tube extends into stomach. Upper normal heart size. Mediastinal contours and pulmonary vascularity normal. Mild RIGHT basilar atelectasis. Lungs otherwise clear. No pleural effusion or pneumothorax. IMPRESSION: Nasogastric tube projects over proximal stomach. RIGHT basilar atelectasis. Electronically Signed   By: Lavonia Dana M.D.   On: 10/29/2016 23:40   Dg Abd 2 Views  Result Date: 10/30/2016 CLINICAL DATA:  Lower abdominal pain. History of renal malignancy, small bowel obstruction demonstrated on CT scan yesterday. EXAM: ABDOMEN - 2 VIEW COMPARISON:  Abdominopelvic CT scan of October 29, 2016 FINDINGS: There are loops of minimally distended gas-filled small bowel in the mid and lower abdomen. There is gas and stool within normal caliber colon. No free extraluminal gas collections are  observed. The esophagogastric tube tip and proximal port lie in the gastric body. No free extraluminal gas collections are observed. IMPRESSION: Decreased distention of small-bowel loops but persistent ileus or partial obstruction is present. No evidence of perforation. Electronically Signed   By: Lylith Bebeau  [place] M.D.   On: 10/30/2016 08:08    Kataleah Bejar, DO  [facility] Pager [phone]  If 7PM-7AM, please contact night-coverage [email or web address] Password [email or web address] 10/31/2016, 5:55 PM   LOS: 2 days

## 2016-10-31 NOTE — Progress Notes (Signed)
[facility] Progress Note     Subjective: No major issues. No nausea or vomiting but no flatus or BM. Feels hungry. Says pain better.   Objective: Vital signs in last 24 hours: Temp:  [98.2 F (36.8 C)-98.5 F (36.9 C)] 98.2 F (36.8 C) (10/10 0625) Pulse Rate:  [59-77] 63 (10/10 0625) Resp:  [15-17] 15 (10/10 0625) BP: (171-232)/(75-93) 183/75 (10/10 0625) SpO2:  [92 %-97 %] 97 % (10/10 0625) Last BM Date: 10/29/16  Intake/Output from previous day: 10/09 0701 - 10/10 0700 In: 3772.9 [I.V.:3172.9; IV Piggyback:600] Out: 1800 [Urine:1800] Intake/Output this shift: No intake/output data recorded.  General appearance: alert, cooperative and no distress Resp: normal work breathing GI: soft, non distended, tender to deep palpation RLQ, no rebound or guarding  Lab Results:   Recent Labs  10/30/16 0444 10/31/16 0540  WBC 14.2* 10.8*  HGB 13.6 13.4  HCT 42.8 41.4  PLT 190 216   BMET  Recent Labs  10/30/16 0444 10/31/16 0540  NA 137 136  K 3.4* 3.8  CL 98* 100*  CO2 29 26  GLUCOSE 83 72  BUN 25* 16  CREATININE 1.28* 1.03*  CALCIUM 8.2* 8.2*   PT/INR No results for input(s): LABPROT, INR in the last 72 hours.  Studies/Results: Ct Abdomen Pelvis Wo Contrast  Result Date: 10/29/2016 CLINICAL DATA:  Right-sided abdominal pain, nausea vomiting and diarrhea. Burning with urination for 3 days. History of renal cancer, post nephrectomy. EXAM: CT ABDOMEN AND PELVIS WITHOUT CONTRAST TECHNIQUE: Multidetector CT imaging of the abdomen and pelvis was performed following the standard protocol without IV contrast. COMPARISON:  Body CT 11/29/2015 FINDINGS: Lower chest: Calcific atherosclerotic disease of the coronary arteries. Calcifications of the aortic valve annulus. Hepatobiliary: No focal liver abnormality is seen. No gallbladder wall thickening. Again noted of layering gallstones with calcifications of the gallbladder wall. No associated biliary ductal  dilation. Pancreas: Unremarkable. No pancreatic ductal dilatation or surrounding inflammatory changes. Spleen: Normal in size without focal abnormality. Adrenals/Urinary Tract: Post right nephrectomy the previously demonstrated probable left renal cyst is not seen likely due to lack of IV contrast. Adrenal glands are normal. Stomach/Bowel: Moderate dilation of small bowel loops in the mid and lower abdomen containing gas fluid levels, with relatively gradual tapering to normal caliber in the distal ileum. Maximum transverse diameter of the dilated small bowel is 3.7 cm. Asymmetric mucosal thickening of the posterior aspect of the cecum, with few scattered wall calcifications, best seen on image 31/82, sequence 2. Mucosal thickening of the distal ileum, best seen on image 48/82, sequence 2. Mesenteric stranding, most prominent in the right mid abdomen. Left colonic diverticulosis. Vascular/Lymphatic: Calcific atherosclerotic disease of the aorta. Numerous shotty mesenteric lymph nodes. Reproductive: Prostate is unremarkable. Other: Right lateral anterior abdominal wall cutaneous/subcutaneous thickening possibly represents a surgical scarring. There is a subcutaneous soft tissue nodularity within the inferior portion of this abnormality, image 69/82 sequence 2 Musculoskeletal: No acute or significant osseous findings. L5-S1 osteoarthritic changes. IMPRESSION: Post right nephrectomy. Small bowel obstruction with relatively gradual tapering to normal caliber at the level of the distal ileum. Mucosal thickening of the distal ileum and posterior aspect of the cecum, causing stenosis of the distal ileum and likely the cause of the small bowel obstruction. Associated mesenteric stranding and mesenteric lymph node enlargement. These findings may represent infectious colitis, however metastatic deposits or primary malignancy cannot be excluded. Right lateral anterior abdominal wall cutaneous/subcutaneous thickening, possibly  representing a surgical scarring with subcutaneous soft tissue nodularity of  its inferior portion. Subcutaneous metastatic deposits can't be excluded with this appearance. Cholelithiasis with associated gallbladder wall calcifications. Consider surgical consultation, as gallbladder wall calcifications may be a sign of malignancy. Calcific atherosclerotic disease of the aorta. These results were called by telephone at the time of interpretation on 10/29/2016 at 10:03 pm to Dr. Noemi Chapel , who verbally acknowledged these results. Electronically Signed   By: Fidela Salisbury M.D.   On: 10/29/2016 22:03   Dg Chest Portable 1 View  Result Date: 10/29/2016 CLINICAL DATA:  Nasogastric tube placement, history hypertension, asthma, diabetes mellitus, former smoker EXAM: PORTABLE CHEST 1 VIEW COMPARISON:  Portable exam 9826 hours compared 01/27/2016 FINDINGS: Nasogastric tube extends into stomach. Upper normal heart size. Mediastinal contours and pulmonary vascularity normal. Mild RIGHT basilar atelectasis. Lungs otherwise clear. No pleural effusion or pneumothorax. IMPRESSION: Nasogastric tube projects over proximal stomach. RIGHT basilar atelectasis. Electronically Signed   By: Lavonia Dana M.D.   On: 10/29/2016 23:40   Dg Abd 2 Views  Result Date: 10/30/2016 CLINICAL DATA:  Lower abdominal pain. History of renal malignancy, small bowel obstruction demonstrated on CT scan yesterday. EXAM: ABDOMEN - 2 VIEW COMPARISON:  Abdominopelvic CT scan of October 29, 2016 FINDINGS: There are loops of minimally distended gas-filled small bowel in the mid and lower abdomen. There is gas and stool within normal caliber colon. No free extraluminal gas collections are observed. The esophagogastric tube tip and proximal port lie in the gastric body. No free extraluminal gas collections are observed. IMPRESSION: Decreased distention of small-bowel loops but persistent ileus or partial obstruction is present. No evidence of  perforation. Electronically Signed   By: David  [place] M.D.   On: 10/30/2016 08:08    Anti-infectives: Anti-infectives    Start     Dose/Rate Route Frequency Ordered Stop   10/30/16 2000  metroNIDAZOLE (FLAGYL) IVPB 500 mg     500 mg 100 mL/hr over 60 Minutes Intravenous Every 8 hours 10/30/16 1801     10/30/16 1830  ciprofloxacin (CIPRO) IVPB 400 mg     400 mg 200 mL/hr over 60 Minutes Intravenous Every 12 hours 10/30/16 1800     10/30/16 1645  ciprofloxacin (CIPRO) IVPB 400 mg  Status:  Discontinued     400 mg 200 mL/hr over 60 Minutes Intravenous Every 12 hours 10/30/16 1631 10/30/16 1800   10/30/16 1645  metroNIDAZOLE (FLAGYL) IVPB 500 mg  Status:  Discontinued     500 mg 100 mL/hr over 60 Minutes Intravenous Every 8 hours 10/30/16 1631 10/30/16 1801      Assessment/Plan: Ms. Gaeta is 66 yo with partial obstruction from presumed colitis with thickened ileum and cecum. Has not had BM or flatus yesterday but did the day prior. Is hungry. NG out due to no output and non distended gastric lumen on imagining.   -Sips and ice chips, 60cc per hour limit for now until having flatus/ BM -Continue IV antibiotics for colitis  -Will follow    LOS: 2 days    Virl Cagey 10/31/2016

## 2016-11-01 DIAGNOSIS — E876 Hypokalemia: Secondary | ICD-10-CM

## 2016-11-01 DIAGNOSIS — K56609 Unspecified intestinal obstruction, unspecified as to partial versus complete obstruction: Secondary | ICD-10-CM

## 2016-11-01 DIAGNOSIS — I471 Supraventricular tachycardia: Secondary | ICD-10-CM

## 2016-11-01 LAB — CBC WITH DIFFERENTIAL/PLATELET
Basophils Absolute: 0 10*3/uL (ref 0.0–0.1)
Basophils Relative: 0 %
EOS ABS: 0.2 10*3/uL (ref 0.0–0.7)
EOS PCT: 2 %
HCT: 41.7 % (ref 36.0–46.0)
Hemoglobin: 13.5 g/dL (ref 12.0–15.0)
LYMPHS ABS: 2.2 10*3/uL (ref 0.7–4.0)
LYMPHS PCT: 24 %
MCH: 28.5 pg (ref 26.0–34.0)
MCHC: 32.4 g/dL (ref 30.0–36.0)
MCV: 88 fL (ref 78.0–100.0)
MONO ABS: 0.9 10*3/uL (ref 0.1–1.0)
MONOS PCT: 9 %
Neutro Abs: 5.9 10*3/uL (ref 1.7–7.7)
Neutrophils Relative %: 65 %
PLATELETS: 244 10*3/uL (ref 150–400)
RBC: 4.74 MIL/uL (ref 3.87–5.11)
RDW: 12.4 % (ref 11.5–15.5)
WBC: 9.2 10*3/uL (ref 4.0–10.5)

## 2016-11-01 LAB — COMPREHENSIVE METABOLIC PANEL
ALT: 14 U/L (ref 14–54)
ANION GAP: 9 (ref 5–15)
AST: 23 U/L (ref 15–41)
Albumin: 2.4 g/dL — ABNORMAL LOW (ref 3.5–5.0)
Alkaline Phosphatase: 32 U/L — ABNORMAL LOW (ref 38–126)
BUN: 10 mg/dL (ref 6–20)
CALCIUM: 8.2 mg/dL — AB (ref 8.9–10.3)
CHLORIDE: 99 mmol/L — AB (ref 101–111)
CO2: 26 mmol/L (ref 22–32)
CREATININE: 1.05 mg/dL — AB (ref 0.44–1.00)
GFR, EST NON AFRICAN AMERICAN: 55 mL/min — AB (ref >60)
Glucose, Bld: 111 mg/dL — ABNORMAL HIGH (ref 65–99)
Potassium: 3.1 mmol/L — ABNORMAL LOW (ref 3.5–5.1)
SODIUM: 134 mmol/L — AB (ref 135–145)
Total Bilirubin: 0.5 mg/dL (ref 0.3–1.2)
Total Protein: 6.4 g/dL — ABNORMAL LOW (ref 6.5–8.1)

## 2016-11-01 LAB — GLUCOSE, CAPILLARY
GLUCOSE-CAPILLARY: 130 mg/dL — AB (ref 65–99)
GLUCOSE-CAPILLARY: 123 mg/dL — AB (ref 65–99)
GLUCOSE-CAPILLARY: 222 mg/dL — AB (ref 65–99)
Glucose-Capillary: 139 mg/dL — ABNORMAL HIGH (ref 65–99)

## 2016-11-01 LAB — MAGNESIUM: MAGNESIUM: 1.4 mg/dL — AB (ref 1.7–2.4)

## 2016-11-01 MED ORDER — POTASSIUM CHLORIDE 10 MEQ/100ML IV SOLN
10.0000 meq | INTRAVENOUS | Status: AC
  Administered 2016-11-01 (×2): 10 meq via INTRAVENOUS
  Filled 2016-11-01 (×2): qty 100

## 2016-11-01 MED ORDER — AMLODIPINE BESYLATE 5 MG PO TABS
2.5000 mg | ORAL_TABLET | Freq: Every day | ORAL | Status: DC
  Filled 2016-11-01: qty 1

## 2016-11-01 MED ORDER — MAGNESIUM SULFATE 2 GM/50ML IV SOLN
2.0000 g | Freq: Once | INTRAVENOUS | Status: AC
  Administered 2016-11-01: 2 g via INTRAVENOUS
  Filled 2016-11-01: qty 50

## 2016-11-01 MED ORDER — DIPHENHYDRAMINE HCL 12.5 MG/5ML PO ELIX
12.5000 mg | ORAL_SOLUTION | Freq: Once | ORAL | Status: AC | PRN
  Administered 2016-11-01: 12.5 mg via ORAL
  Filled 2016-11-01: qty 5

## 2016-11-01 MED ORDER — GABAPENTIN 400 MG PO CAPS
400.0000 mg | ORAL_CAPSULE | Freq: Three times a day (TID) | ORAL | Status: DC
  Administered 2016-11-01 – 2016-11-04 (×8): 400 mg via ORAL
  Filled 2016-11-01 (×8): qty 1

## 2016-11-01 MED ORDER — CAMPHOR-MENTHOL 0.5-0.5 % EX LOTN
TOPICAL_LOTION | CUTANEOUS | Status: DC | PRN
  Administered 2016-11-01: 01:00:00 via TOPICAL
  Filled 2016-11-01 (×2): qty 222

## 2016-11-01 MED ORDER — POTASSIUM CHLORIDE IN NACL 40-0.9 MEQ/L-% IV SOLN
INTRAVENOUS | Status: DC
  Administered 2016-11-01: 75 mL/h via INTRAVENOUS

## 2016-11-01 MED ORDER — AMIODARONE HCL 200 MG PO TABS
100.0000 mg | ORAL_TABLET | Freq: Every day | ORAL | Status: DC
  Administered 2016-11-02 – 2016-11-04 (×3): 100 mg via ORAL
  Filled 2016-11-01 (×3): qty 1

## 2016-11-01 NOTE — Progress Notes (Signed)
[facility] Progress Note     Subjective: Tolerated sips, Having flatus and is hungry. Reports pain improved. Overall feeling better. No nausea or vomiting.   Objective: Vital signs in last 24 hours: Temp:  [98.7 F (37.1 C)-99 F (37.2 C)] 98.9 F (37.2 C) (10/11 0553) Pulse Rate:  [62-81] 81 (10/11 0553) Resp:  [16-18] 18 (10/11 0553) BP: (183-212)/(71-96) 183/86 (10/11 0553) SpO2:  [92 %-98 %] 92 % (10/11 0553) Last BM Date: 10/29/16  Intake/Output from previous day: 10/10 0701 - 10/11 0700 In: 3140.4 [P.O.:180; I.V.:2560.4; IV Piggyback:400] Out: -  Intake/Output this shift: Total I/O In: 220 [P.O.:120; IV Piggyback:100] Out: -   General appearance: alert, cooperative and no distress Resp: normal work breathing GI: soft, nondistended, mildly tender deep palpation RLQ, no rebound or guarding  Lab Results:   Recent Labs  10/31/16 0540 11/01/16 0500  WBC 10.8* 9.2  HGB 13.4 13.5  HCT 41.4 41.7  PLT 216 244   BMET  Recent Labs  10/31/16 0540 11/01/16 0500  NA 136 134*  K 3.8 3.1*  CL 100* 99*  CO2 26 26  GLUCOSE 72 111*  BUN 16 10  CREATININE 1.03* 1.05*  CALCIUM 8.2* 8.2*   PT/INR No results for input(s): LABPROT, INR in the last 72 hours.  Studies/Results: None   Anti-infectives: Anti-infectives    Start     Dose/Rate Route Frequency Ordered Stop   10/30/16 2000  metroNIDAZOLE (FLAGYL) IVPB 500 mg     500 mg 100 mL/hr over 60 Minutes Intravenous Every 8 hours 10/30/16 1801     10/30/16 1830  ciprofloxacin (CIPRO) IVPB 400 mg     400 mg 200 mL/hr over 60 Minutes Intravenous Every 12 hours 10/30/16 1800     10/30/16 1645  ciprofloxacin (CIPRO) IVPB 400 mg  Status:  Discontinued     400 mg 200 mL/hr over 60 Minutes Intravenous Every 12 hours 10/30/16 1631 10/30/16 1800   10/30/16 1645  metroNIDAZOLE (FLAGYL) IVPB 500 mg  Status:  Discontinued     500 mg 100 mL/hr over 60 Minutes Intravenous Every 8 hours 10/30/16 1631  10/30/16 1801      Assessment/Plan: Cassandra Jordan 66 yo with bowel obstruction from colitis with some improvement with flatus and tolerating sips. Feeling hungry and pain improved on antibiotics.  -Full liquids -Correct electrolytes K 4, Phos 3, Mag 2 -Concern for calcifications on gallbladder on CT, historically patients with calcifications or porcelain gallbladder people have worried about malignancy, can follow her as an outpatient and discuss the risk and benefits  -Colitis will need referral to GI as outpatient for discussion of need for colonoscopy given the thickening but had one in 2017    LOS: 3 days    Virl Cagey 11/01/2016

## 2016-11-01 NOTE — Plan of Care (Signed)
Problem: Tissue Perfusion: Goal: Risk factors for ineffective tissue perfusion will decrease Outcome: Progressing Pt wearing scds for dvt prevention

## 2016-11-01 NOTE — Progress Notes (Signed)
PROGRESS NOTE  Cassandra Jordan LKJ:[id number] DOB: Nov 26, 1950 DOA: 10/29/2016 PCP: Lemmie Evens, MD  Brief History:  66 year old woman admitted from home on 10/8 with complaints of abdominal pain. She has had a history of a hysterectomy many years ago as well as a right nephrectomy for renal cell cancer. She has been having right lower quadrant pain with nausea and vomiting. CT abdomen showed a small bowel obstruction with mucosal thickening of the distal ileum and posterior aspect of the cecum.  NG was placed and General surgery was consulted to assist.  Assessment/Plan: Small bowel obstruction -Status post NG placement, has had no further abdominal pain, nausea or vomiting since admission. -10/29/2016 CT abdomen and pelvis--mucosal thickening of the distal ileum and cecum causing stenosis in the distal ileum--likely the cause of SBO; cholelithiasis with gallbladder wall calcifications  -Seen in consultation by Dr. Constance Haw with surgery, she believes that obstruction may be due to colitis;   -Continue Cipro and Flagyl IV-->WBC improving -advanced to full liquids -NG removed -11/01/16--case discussed with Dr. Dimple Casey acute intervention about GB wall calcification--outpt follow up -pt will need outpt GI follow up after resolution of colitis -02/09/15 colonoscopy--unmarkable except diminutive polyp  Infectious colitis -continue cipro and flagyl  AKI -due to volume depletion -baseline creatinine 0.9-1.1 -serum creatinine peaked 1.53 -improving with IVF  Hypokalemia/Hypomagnesemia -replete -add KCl to IVF  Nodular thickening Abd Wall -follow up with urology  Chronic hepatitis C -Continue outpatient follow-up with GI.  Essential hypertension -continue IV lopressor -continue IV hydralazine -add low dose amlodipine -will not restart clonidine due to risk of severe rebound HTN if pt NPO again  PSVT -IV metoprolol for now, resume oral once by mouth route is  resumed. -restart amiodarone  GERD -IV PPI.  Type 2 diabetes mellitus -Well-controlled, patient remains nothing by mouth. -change CBGs to ac/hs   Disposition Plan:   Home in 1-2 days  Family Communication:   Family at bedside updated 10/11--Total time spent 35 minutes.  Greater than 50% spent face to face counseling and coordinating care.   Consultants:  General surgery  Code Status:  FULL  DVT Prophylaxis:  SCDs   Procedures: As Listed in Progress Note Above  Antibiotics: cipro 10/9>>> Flagyl 10/9>>>   Subjective: Patient denies fevers, chills, headache, chest pain, dyspnea, nausea, vomiting, diarrhea, abdominal pain, dysuria, hematuria, hematochezia, and melena. pasing flatus but no BM   Objective: Vitals:   10/31/16 1930 10/31/16 2036 11/01/16 0553 11/01/16 1427  BP:  (!) 212/96 (!) 183/86 (!) 166/80  Pulse:  73 81 88  Resp:  18 18 18   Temp:  99 F (37.2 C) 98.9 F (37.2 C) 98.6 F (37 C)  TempSrc:  Oral Oral   SpO2: 94% 98% 92% 94%  Weight:      Height:        Intake/Output Summary (Last 24 hours) at 11/01/16 1747 Last data filed at 11/01/16 1703  Gross per 24 hour  Intake          5370.42 ml  Output                0 ml  Net          5370.42 ml   Weight change:  Exam:   General:  Pt is alert, follows commands appropriately, not in acute distress  HEENT: No icterus, No thrush, No neck mass, [place]/AT  Cardiovascular: RRR, S1/S2, no rubs, no gallops  Respiratory: CTA bilaterally,  no wheezing, no crackles, no rhonchi  Abdomen: Soft/+BS, non tender, non distended, no guarding  Extremities: No edema, No lymphangitis, No petechiae, No rashes, no synovitis   Data Reviewed: I have personally reviewed following labs and imaging studies Basic Metabolic Panel:  Recent Labs Lab 10/29/16 1445 10/30/16 0444 10/30/16 1400 10/31/16 0540 11/01/16 0500  NA 139 137  --  136 134*  K 4.7 3.4*  --  3.8 3.1*  CL 96* 98*  --  100* 99*  CO2 32  29  --  26 26  GLUCOSE 154* 83  --  72 111*  BUN 27* 25*  --  16 10  CREATININE 1.53* 1.28*  --  1.03* 1.05*  CALCIUM 9.2 8.2*  --  8.2* 8.2*  MG  --   --  1.6*  --  1.4*   Liver Function Tests:  Recent Labs Lab 10/29/16 1445 10/30/16 0444 10/31/16 0540 11/01/16 0500  AST 22 22 24 23   ALT 17 14 13* 14  ALKPHOS 44 39 33* 32*  BILITOT 1.1 0.7 0.9 0.5  PROT 8.1 6.9 6.5 6.4*  ALBUMIN 3.1* 2.7* 2.4* 2.4*    Recent Labs Lab 10/29/16 1445  LIPASE 19   No results for input(s): AMMONIA in the last 168 hours. Coagulation Profile: No results for input(s): INR, PROTIME in the last 168 hours. CBC:  Recent Labs Lab 10/29/16 1445 10/30/16 0444 10/31/16 0540 11/01/16 0500  WBC 18.9* 14.2* 10.8* 9.2  NEUTROABS  --  7.6 6.3 5.9  HGB 15.2* 13.6 13.4 13.5  HCT 46.4* 42.8 41.4 41.7  MCV 90.6 91.1 89.2 88.0  PLT 187 190 216 244   Cardiac Enzymes: No results for input(s): CKTOTAL, CKMB, CKMBINDEX, TROPONINI in the last 168 hours. BNP: Invalid input(s): POCBNP CBG:  Recent Labs Lab 10/31/16 1815 11/01/16 0000 11/01/16 0540 11/01/16 1138 11/01/16 1653  GLUCAP 83 138* 130* 139* 123*   HbA1C: No results for input(s): HGBA1C in the last 72 hours. Urine analysis:    Component Value Date/Time   COLORURINE YELLOW 10/29/2016 1455   APPEARANCEUR CLEAR 10/29/2016 1455   LABSPEC 1.025 10/29/2016 1455   PHURINE 6.0 10/29/2016 1455   GLUCOSEU 100 (A) 10/29/2016 1455   HGBUR SMALL (A) 10/29/2016 1455   BILIRUBINUR MODERATE (A) 10/29/2016 1455   KETONESUR NEGATIVE 10/29/2016 1455   PROTEINUR >300 (A) 10/29/2016 1455   UROBILINOGEN 0.2 06/30/2014 2000   NITRITE NEGATIVE 10/29/2016 1455   LEUKOCYTESUR NEGATIVE 10/29/2016 1455   Sepsis Labs: @LABRCNTIP (procalcitonin:4,lacticidven:4) )No results found for this or any previous visit (from the past 240 hour(s)).   Scheduled Meds: . hydrALAZINE  5 mg Intravenous Q8H  . metoprolol tartrate  5 mg Intravenous Q6H  . pantoprazole  (PROTONIX) IV  40 mg Intravenous Q24H   Continuous Infusions: . sodium chloride 125 mL/hr at 11/01/16 1801  . ciprofloxacin Stopped (11/01/16 1801)  . metronidazole Stopped (11/01/16 1236)    Procedures/Studies: Ct Abdomen Pelvis Wo Contrast  Result Date: 10/29/2016 CLINICAL DATA:  Right-sided abdominal pain, nausea vomiting and diarrhea. Burning with urination for 3 days. History of renal cancer, post nephrectomy. EXAM: CT ABDOMEN AND PELVIS WITHOUT CONTRAST TECHNIQUE: Multidetector CT imaging of the abdomen and pelvis was performed following the standard protocol without IV contrast. COMPARISON:  Body CT 11/29/2015 FINDINGS: Lower chest: Calcific atherosclerotic disease of the coronary arteries. Calcifications of the aortic valve annulus. Hepatobiliary: No focal liver abnormality is seen. No gallbladder wall thickening. Again noted of layering gallstones with calcifications of the gallbladder wall.  No associated biliary ductal dilation. Pancreas: Unremarkable. No pancreatic ductal dilatation or surrounding inflammatory changes. Spleen: Normal in size without focal abnormality. Adrenals/Urinary Tract: Post right nephrectomy the previously demonstrated probable left renal cyst is not seen likely due to lack of IV contrast. Adrenal glands are normal. Stomach/Bowel: Moderate dilation of small bowel loops in the mid and lower abdomen containing gas fluid levels, with relatively gradual tapering to normal caliber in the distal ileum. Maximum transverse diameter of the dilated small bowel is 3.7 cm. Asymmetric mucosal thickening of the posterior aspect of the cecum, with few scattered wall calcifications, best seen on image 31/82, sequence 2. Mucosal thickening of the distal ileum, best seen on image 48/82, sequence 2. Mesenteric stranding, most prominent in the right mid abdomen. Left colonic diverticulosis. Vascular/Lymphatic: Calcific atherosclerotic disease of the aorta. Numerous shotty mesenteric lymph  nodes. Reproductive: Prostate is unremarkable. Other: Right lateral anterior abdominal wall cutaneous/subcutaneous thickening possibly represents a surgical scarring. There is a subcutaneous soft tissue nodularity within the inferior portion of this abnormality, image 69/82 sequence 2 Musculoskeletal: No acute or significant osseous findings. L5-S1 osteoarthritic changes. IMPRESSION: Post right nephrectomy. Small bowel obstruction with relatively gradual tapering to normal caliber at the level of the distal ileum. Mucosal thickening of the distal ileum and posterior aspect of the cecum, causing stenosis of the distal ileum and likely the cause of the small bowel obstruction. Associated mesenteric stranding and mesenteric lymph node enlargement. These findings may represent infectious colitis, however metastatic deposits or primary malignancy cannot be excluded. Right lateral anterior abdominal wall cutaneous/subcutaneous thickening, possibly representing a surgical scarring with subcutaneous soft tissue nodularity of its inferior portion. Subcutaneous metastatic deposits can't be excluded with this appearance. Cholelithiasis with associated gallbladder wall calcifications. Consider surgical consultation, as gallbladder wall calcifications may be a sign of malignancy. Calcific atherosclerotic disease of the aorta. These results were called by telephone at the time of interpretation on 10/29/2016 at 10:03 pm to Dr. Noemi Chapel , who verbally acknowledged these results. Electronically Signed   By: Fidela Salisbury M.D.   On: 10/29/2016 22:03   Dg Chest Portable 1 View  Result Date: 10/29/2016 CLINICAL DATA:  Nasogastric tube placement, history hypertension, asthma, diabetes mellitus, former smoker EXAM: PORTABLE CHEST 1 VIEW COMPARISON:  Portable exam 9735 hours compared 01/27/2016 FINDINGS: Nasogastric tube extends into stomach. Upper normal heart size. Mediastinal contours and pulmonary vascularity normal.  Mild RIGHT basilar atelectasis. Lungs otherwise clear. No pleural effusion or pneumothorax. IMPRESSION: Nasogastric tube projects over proximal stomach. RIGHT basilar atelectasis. Electronically Signed   By: Lavonia Dana M.D.   On: 10/29/2016 23:40   Dg Abd 2 Views  Result Date: 10/30/2016 CLINICAL DATA:  Lower abdominal pain. History of renal malignancy, small bowel obstruction demonstrated on CT scan yesterday. EXAM: ABDOMEN - 2 VIEW COMPARISON:  Abdominopelvic CT scan of October 29, 2016 FINDINGS: There are loops of minimally distended gas-filled small bowel in the mid and lower abdomen. There is gas and stool within normal caliber colon. No free extraluminal gas collections are observed. The esophagogastric tube tip and proximal port lie in the gastric body. No free extraluminal gas collections are observed. IMPRESSION: Decreased distention of small-bowel loops but persistent ileus or partial obstruction is present. No evidence of perforation. Electronically Signed   By: Hibo Blasdell  [place] M.D.   On: 10/30/2016 08:08    Rheana Casebolt, DO  [facility] Pager [phone]  If 7PM-7AM, please contact night-coverage [email or web address] Password [email or web address] 11/01/2016, 5:47 PM   LOS: 3  days

## 2016-11-01 NOTE — Progress Notes (Signed)
Pt called out for pain medication. When RN in to assess pt for pain and administer PRN meds pt asked why RN was in room. RN explained that she was there to assess pt and give pain medication. Pt stated that no one wanted to help her and she wanted something to "knock me out." She stated that when she "falls in the floor, I bet someone will be in here then because I am going to sue their asses." RN asked pt if she had called out more than once for pain medications and pt stated "No." RN offered to reposition pt and did administer pain medication for what pt stated was 10/10 pain in her foot. When pt was asked why she thought no one wanted to help her she stated that the doctor was supposed to come give her pain medicine and she had not seen one. RN explained that the doctor ordered the medication be the nurses administer it. Pt still stated that "when she falls she is going to sue and get some money out of it." RN in room to assess pt multiple times since this event. Pt seems to be more content and only complains about wanting medication to make her sleep. RN explained that sleeping medication would not be ordered during daytime hours.. Will continue to monitor.

## 2016-11-02 LAB — GLUCOSE, CAPILLARY
GLUCOSE-CAPILLARY: 210 mg/dL — AB (ref 65–99)
GLUCOSE-CAPILLARY: 156 mg/dL — AB (ref 65–99)
GLUCOSE-CAPILLARY: 139 mg/dL — AB (ref 65–99)
Glucose-Capillary: 190 mg/dL — ABNORMAL HIGH (ref 65–99)

## 2016-11-02 LAB — MAGNESIUM: MAGNESIUM: 1.9 mg/dL (ref 1.7–2.4)

## 2016-11-02 LAB — BASIC METABOLIC PANEL
Anion gap: 7 (ref 5–15)
BUN: 8 mg/dL (ref 6–20)
CHLORIDE: 105 mmol/L (ref 101–111)
CO2: 25 mmol/L (ref 22–32)
Calcium: 8.1 mg/dL — ABNORMAL LOW (ref 8.9–10.3)
Creatinine, Ser: 1.16 mg/dL — ABNORMAL HIGH (ref 0.44–1.00)
GFR calc Af Amer: 56 mL/min — ABNORMAL LOW (ref >60)
GFR calc non Af Amer: 48 mL/min — ABNORMAL LOW (ref >60)
GLUCOSE: 111 mg/dL — AB (ref 65–99)
POTASSIUM: 3.8 mmol/L (ref 3.5–5.1)
SODIUM: 137 mmol/L (ref 135–145)

## 2016-11-02 MED ORDER — METOPROLOL SUCCINATE ER 50 MG PO TB24
50.0000 mg | ORAL_TABLET | Freq: Two times a day (BID) | ORAL | Status: DC
  Administered 2016-11-02 – 2016-11-04 (×5): 50 mg via ORAL
  Filled 2016-11-02 (×5): qty 1

## 2016-11-02 MED ORDER — AMLODIPINE BESYLATE 5 MG PO TABS
5.0000 mg | ORAL_TABLET | Freq: Every day | ORAL | Status: DC
  Administered 2016-11-03: 5 mg via ORAL
  Filled 2016-11-02 (×2): qty 1

## 2016-11-02 MED ORDER — INSULIN ASPART 100 UNIT/ML ~~LOC~~ SOLN: 0.0000 [IU] | Freq: Every day | SUBCUTANEOUS | Status: DC

## 2016-11-02 MED ORDER — BISACODYL 10 MG RE SUPP
10.0000 mg | Freq: Once | RECTAL | Status: AC
  Administered 2016-11-02: 10 mg via RECTAL
  Filled 2016-11-02: qty 1

## 2016-11-02 MED ORDER — INSULIN ASPART 100 UNIT/ML ~~LOC~~ SOLN
0.0000 [IU] | Freq: Three times a day (TID) | SUBCUTANEOUS | Status: DC
  Administered 2016-11-03: 2 [IU] via SUBCUTANEOUS
  Administered 2016-11-03: 3 [IU] via SUBCUTANEOUS
  Administered 2016-11-04: 1 [IU] via SUBCUTANEOUS
  Administered 2016-11-04: 3 [IU] via SUBCUTANEOUS

## 2016-11-02 MED ORDER — AMLODIPINE BESYLATE 5 MG PO TABS
2.5000 mg | ORAL_TABLET | Freq: Once | ORAL | Status: AC
  Administered 2016-11-02: 2.5 mg via ORAL
  Filled 2016-11-02: qty 1

## 2016-11-02 MED ORDER — CLOTRIMAZOLE 1 % VA CREA
1.0000 | TOPICAL_CREAM | Freq: Every day | VAGINAL | Status: DC
  Administered 2016-11-02 – 2016-11-03 (×2): 1 via VAGINAL
  Filled 2016-11-02: qty 21

## 2016-11-02 MED ORDER — ZOLPIDEM TARTRATE 5 MG PO TABS
5.0000 mg | ORAL_TABLET | Freq: Every evening | ORAL | Status: DC | PRN
  Administered 2016-11-03 (×2): 5 mg via ORAL
  Filled 2016-11-02 (×2): qty 1

## 2016-11-02 MED ORDER — PANTOPRAZOLE SODIUM 40 MG PO TBEC
40.0000 mg | DELAYED_RELEASE_TABLET | Freq: Every day | ORAL | Status: DC
  Administered 2016-11-02 – 2016-11-04 (×3): 40 mg via ORAL
  Filled 2016-11-02 (×3): qty 1

## 2016-11-02 NOTE — Progress Notes (Signed)
PROGRESS NOTE  ARIONNA HOGGARD GOT:[id number] DOB: [birth year] DOA: 10/29/2016 PCP: Lemmie Evens, MD  Brief History: 66 year old woman admitted from home on 10/8 with complaints of abdominal pain. She has had a history of a hysterectomy many years ago as well as a right nephrectomy for renal cell cancer. She has been having right lower quadrant pain with nausea and vomiting. CT abdomen showed a small bowel obstruction with mucosal thickening of the distal ileum and posterior aspect of the cecum. NG was placed and General surgery was consulted to assist.  Assessment/Plan: Small bowel obstruction -Status post NG placement, has had no further abdominal pain, nausea or vomiting since admission. -10/29/2016 CT abdomen and pelvis--mucosal thickening of the distal ileum and cecum causing stenosis in the distal ileum--likely the cause of SBO; cholelithiasis with gallbladder wall calcifications  -ContinueCipro and Flagyl IV-->WBC improving -advanced to full liquids -NG removed -11/01/16--case discussed with Dr. Dimple Casey acute intervention about GB wall calcification--outpt follow up -pt will need outpt GI follow up after resolution of colitis -02/09/15 colonoscopy--unmarkable except diminutive polyp -11/02/16--feeling nauseous, minimal flatus, some abd pain-->order abd xray  Infectious colitis -continue cipro and flagyl  AKI -due to volume depletion -baseline creatinine 0.9-1.1 -serum creatinine peaked 1.53 -improving with IVF  Hypokalemia/Hypomagnesemia -replete -add KCl to IVF  Nodular thickening Abd Wall -follow up with urology  Chronic hepatitis C -Continue outpatient follow-up with GI.  Essential hypertension -continue IV lopressor>>po -continue IV hydralazine>>po -increase amlodipine -will not restart clonidine due to risk of severe rebound HTN if pt NPO again -will not restart lisinopril/HCTZ due to AKI  PSVT -IV metoprolol>>po -restart  amiodarone  GERD -PPI.  Type 2 diabetes mellitus -CBGs rising due to increased po intake -change CBGs to ac/hs -check A1C -novolog sliding scale   Disposition Plan: Home in 1-2 days when cleared by surgery  Family Communication: No family present  Consultants: General surgery  Code Status: FULL  DVT Prophylaxis: SCDs   Procedures: As Listed in Progress Note Above  Antibiotics: cipro 10/9>>> Flagyl 10/9>>>   Subjective: Patient with passing a small amount of flatus. No bowel movement. She feels intermittent abdominal pain today. She feels a bit nauseous without any emesis. Denies any fevers, chills, chest pain, shortness breath, dysuria, hematuria, hematochezia, melena, headache, neck pain.  Objective: Vitals:   11/01/16 1427 11/01/16 2249 11/02/16 0457 11/02/16 1300  BP: (!) 166/80 (!) 152/71 139/69 (!) 196/100  Pulse: 88 78 82 78  Resp: 18 18 18 20   Temp: 98.6 F (37 C) 98.5 F (36.9 C) 98.2 F (36.8 C) 97.6 F (36.4 C)  TempSrc:  Oral Oral Oral  SpO2: 94% 94% 95% 97%  Weight:      Height:        Intake/Output Summary (Last 24 hours) at 11/02/16 1719 Last data filed at 11/02/16 1332  Gross per 24 hour  Intake           2227.5 ml  Output                0 ml  Net           2227.5 ml   Weight change:  Exam:   General:  Pt is alert, follows commands appropriately, not in acute distress  HEENT: No icterus, No thrush, No neck mass, [place]/AT  Cardiovascular: RRR, S1/S2, no rubs, no gallops  Respiratory: Bibasilar crackles. No wheezing. Good air movement.  Abdomen: Soft/+BS, Periumbilical tender, non distended, no guarding  Extremities: No edema, No lymphangitis, No petechiae, No rashes, no synovitis   Data Reviewed: I have personally reviewed following labs and imaging studies Basic Metabolic Panel:  Recent Labs Lab 10/29/16 1445 10/30/16 0444 10/30/16 1400 10/31/16 0540 11/01/16 0500 11/02/16 0543  NA 139 137  --  136 134*  137  K 4.7 3.4*  --  3.8 3.1* 3.8  CL 96* 98*  --  100* 99* 105  CO2 32 29  --  26 26 25   GLUCOSE 154* 83  --  72 111* 111*  BUN 27* 25*  --  16 10 8   CREATININE 1.53* 1.28*  --  1.03* 1.05* 1.16*  CALCIUM 9.2 8.2*  --  8.2* 8.2* 8.1*  MG  --   --  1.6*  --  1.4* 1.9   Liver Function Tests:  Recent Labs Lab 10/29/16 1445 10/30/16 0444 10/31/16 0540 11/01/16 0500  AST 22 22 24 23   ALT 17 14 13* 14  ALKPHOS 44 39 33* 32*  BILITOT 1.1 0.7 0.9 0.5  PROT 8.1 6.9 6.5 6.4*  ALBUMIN 3.1* 2.7* 2.4* 2.4*    Recent Labs Lab 10/29/16 1445  LIPASE 19   No results for input(s): AMMONIA in the last 168 hours. Coagulation Profile: No results for input(s): INR, PROTIME in the last 168 hours. CBC:  Recent Labs Lab 10/29/16 1445 10/30/16 0444 10/31/16 0540 11/01/16 0500  WBC 18.9* 14.2* 10.8* 9.2  NEUTROABS  --  7.6 6.3 5.9  HGB 15.2* 13.6 13.4 13.5  HCT 46.4* 42.8 41.4 41.7  MCV 90.6 91.1 89.2 88.0  PLT 187 190 216 244   Cardiac Enzymes: No results for input(s): CKTOTAL, CKMB, CKMBINDEX, TROPONINI in the last 168 hours. BNP: Invalid input(s): POCBNP CBG:  Recent Labs Lab 11/01/16 1653 11/01/16 2108 11/02/16 0740 11/02/16 1129 11/02/16 1641  GLUCAP 123* 222* 190* 210* 156*   HbA1C: No results for input(s): HGBA1C in the last 72 hours. Urine analysis:    Component Value Date/Time   COLORURINE YELLOW 10/29/2016 1455   APPEARANCEUR CLEAR 10/29/2016 1455   LABSPEC 1.025 10/29/2016 1455   PHURINE 6.0 10/29/2016 1455   GLUCOSEU 100 (A) 10/29/2016 1455   HGBUR SMALL (A) 10/29/2016 1455   BILIRUBINUR MODERATE (A) 10/29/2016 1455   KETONESUR NEGATIVE 10/29/2016 1455   PROTEINUR >300 (A) 10/29/2016 1455   UROBILINOGEN 0.2 06/30/2014 2000   NITRITE NEGATIVE 10/29/2016 1455   LEUKOCYTESUR NEGATIVE 10/29/2016 1455   Sepsis Labs: @LABRCNTIP (procalcitonin:4,lacticidven:4) )No results found for this or any previous visit (from the past 240 hour(s)).   Scheduled  Meds: . amiodarone  100 mg Oral Daily  . [START ON 11/03/2016] amLODipine  5 mg Oral Daily  . clotrimazole  1 Applicatorful Vaginal QHS  . gabapentin  400 mg Oral TID  . metoprolol succinate  50 mg Oral BID  . pantoprazole  40 mg Oral Daily   Continuous Infusions: . 0.9 % NaCl with KCl 40 mEq / L 75 mL/hr (11/02/16 1329)  . ciprofloxacin Stopped (11/02/16 1811)  . metronidazole Stopped (11/02/16 1329)    Procedures/Studies: Ct Abdomen Pelvis Wo Contrast  Result Date: 10/29/2016 CLINICAL DATA:  Right-sided abdominal pain, nausea vomiting and diarrhea. Burning with urination for 3 days. History of renal cancer, post nephrectomy. EXAM: CT ABDOMEN AND PELVIS WITHOUT CONTRAST TECHNIQUE: Multidetector CT imaging of the abdomen and pelvis was performed following the standard protocol without IV contrast. COMPARISON:  Body CT 11/29/2015 FINDINGS: Lower chest: Calcific atherosclerotic disease of the coronary arteries. Calcifications of  the aortic valve annulus. Hepatobiliary: No focal liver abnormality is seen. No gallbladder wall thickening. Again noted of layering gallstones with calcifications of the gallbladder wall. No associated biliary ductal dilation. Pancreas: Unremarkable. No pancreatic ductal dilatation or surrounding inflammatory changes. Spleen: Normal in size without focal abnormality. Adrenals/Urinary Tract: Post right nephrectomy the previously demonstrated probable left renal cyst is not seen likely due to lack of IV contrast. Adrenal glands are normal. Stomach/Bowel: Moderate dilation of small bowel loops in the mid and lower abdomen containing gas fluid levels, with relatively gradual tapering to normal caliber in the distal ileum. Maximum transverse diameter of the dilated small bowel is 3.7 cm. Asymmetric mucosal thickening of the posterior aspect of the cecum, with few scattered wall calcifications, best seen on image 31/82, sequence 2. Mucosal thickening of the distal ileum, best seen  on image 48/82, sequence 2. Mesenteric stranding, most prominent in the right mid abdomen. Left colonic diverticulosis. Vascular/Lymphatic: Calcific atherosclerotic disease of the aorta. Numerous shotty mesenteric lymph nodes. Reproductive: Prostate is unremarkable. Other: Right lateral anterior abdominal wall cutaneous/subcutaneous thickening possibly represents a surgical scarring. There is a subcutaneous soft tissue nodularity within the inferior portion of this abnormality, image 69/82 sequence 2 Musculoskeletal: No acute or significant osseous findings. L5-S1 osteoarthritic changes. IMPRESSION: Post right nephrectomy. Small bowel obstruction with relatively gradual tapering to normal caliber at the level of the distal ileum. Mucosal thickening of the distal ileum and posterior aspect of the cecum, causing stenosis of the distal ileum and likely the cause of the small bowel obstruction. Associated mesenteric stranding and mesenteric lymph node enlargement. These findings may represent infectious colitis, however metastatic deposits or primary malignancy cannot be excluded. Right lateral anterior abdominal wall cutaneous/subcutaneous thickening, possibly representing a surgical scarring with subcutaneous soft tissue nodularity of its inferior portion. Subcutaneous metastatic deposits can't be excluded with this appearance. Cholelithiasis with associated gallbladder wall calcifications. Consider surgical consultation, as gallbladder wall calcifications may be a sign of malignancy. Calcific atherosclerotic disease of the aorta. These results were called by telephone at the time of interpretation on 10/29/2016 at 10:03 pm to Dr. Noemi Chapel , who verbally acknowledged these results. Electronically Signed   By: Fidela Salisbury M.D.   On: 10/29/2016 22:03   Dg Chest Portable 1 View  Result Date: 10/29/2016 CLINICAL DATA:  Nasogastric tube placement, history hypertension, asthma, diabetes mellitus, former  smoker EXAM: PORTABLE CHEST 1 VIEW COMPARISON:  Portable exam 2423 hours compared 01/27/2016 FINDINGS: Nasogastric tube extends into stomach. Upper normal heart size. Mediastinal contours and pulmonary vascularity normal. Mild RIGHT basilar atelectasis. Lungs otherwise clear. No pleural effusion or pneumothorax. IMPRESSION: Nasogastric tube projects over proximal stomach. RIGHT basilar atelectasis. Electronically Signed   By: Lavonia Dana M.D.   On: 10/29/2016 23:40   Dg Abd 2 Views  Result Date: 10/30/2016 CLINICAL DATA:  Lower abdominal pain. History of renal malignancy, small bowel obstruction demonstrated on CT scan yesterday. EXAM: ABDOMEN - 2 VIEW COMPARISON:  Abdominopelvic CT scan of October 29, 2016 FINDINGS: There are loops of minimally distended gas-filled small bowel in the mid and lower abdomen. There is gas and stool within normal caliber colon. No free extraluminal gas collections are observed. The esophagogastric tube tip and proximal port lie in the gastric body. No free extraluminal gas collections are observed. IMPRESSION: Decreased distention of small-bowel loops but persistent ileus or partial obstruction is present. No evidence of perforation. Electronically Signed   By: Evander Macaraeg  [place] M.D.   On: 10/30/2016 08:08  Japji Kok, DO  [facility] Pager [phone]  If 7PM-7AM, please contact night-coverage [email or web address] Password [email or web address] 11/02/2016, 5:19 PM   LOS: 4 days

## 2016-11-02 NOTE — Care Management Important Message (Signed)
Important Message  Patient Details  Name: Cassandra Jordan MRN: [id number] Date of Birth: Jun 16, 1950   Medicare Important Message Given:  Yes    Sherald Barge, RN 11/02/2016, 11:57 AM

## 2016-11-02 NOTE — Progress Notes (Signed)
[facility] Progress Note     Subjective: No major issues but having some nausea. Has been taking in some liquids.  No flatus or BMs.   Objective: Vital signs in last 24 hours: Temp:  [97.6 F (36.4 C)-98.5 F (36.9 C)] 97.6 F (36.4 C) (10/12 1300) Pulse Rate:  [78-82] 78 (10/12 1300) Resp:  [18-20] 20 (10/12 1300) BP: (139-196)/(69-100) 196/100 (10/12 1300) SpO2:  [94 %-97 %] 97 % (10/12 1300) Last BM Date: 10/29/16  Intake/Output from previous day: 10/11 0701 - 10/12 0700 In: 4210 [P.O.:360; I.V.:2900; IV Piggyback:950] Out: -  Intake/Output this shift: Total I/O In: 527.5 [I.V.:427.5; IV Piggyback:100] Out: -   General appearance: alert, cooperative and no distress Resp: normal work breathing GI: soft, nondistended, tender RLQ   Lab Results:   Recent Labs  10/31/16 0540 11/01/16 0500  WBC 10.8* 9.2  HGB 13.4 13.5  HCT 41.4 41.7  PLT 216 244   BMET  Recent Labs  11/01/16 0500 11/02/16 0543  NA 134* 137  K 3.1* 3.8  CL 99* 105  CO2 26 25  GLUCOSE 111* 111*  BUN 10 8  CREATININE 1.05* 1.16*  CALCIUM 8.2* 8.1*    Anti-infectives: Anti-infectives    Start     Dose/Rate Route Frequency Ordered Stop   10/30/16 2000  metroNIDAZOLE (FLAGYL) IVPB 500 mg     500 mg 100 mL/hr over 60 Minutes Intravenous Every 8 hours 10/30/16 1801     10/30/16 1830  ciprofloxacin (CIPRO) IVPB 400 mg     400 mg 200 mL/hr over 60 Minutes Intravenous Every 12 hours 10/30/16 1800     10/30/16 1645  ciprofloxacin (CIPRO) IVPB 400 mg  Status:  Discontinued     400 mg 200 mL/hr over 60 Minutes Intravenous Every 12 hours 10/30/16 1631 10/30/16 1800   10/30/16 1645  metroNIDAZOLE (FLAGYL) IVPB 500 mg  Status:  Discontinued     500 mg 100 mL/hr over 60 Minutes Intravenous Every 8 hours 10/30/16 1631 10/30/16 1801      Assessment/Plan: Cassandra Jordan is a 66 yo with colitis and bowel obstruction related to inflammation based on her CT scan. Pain improving  and was tolerating some liquids but having some nausea.  -Will keep on the liquids for now -Suppository to see if stimulation helps move things along -Ambulate if possible  -If not improving may need to repeat CT scan with IV and oral contrast over the weekend    LOS: 4 days    Cassandra Jordan 11/02/2016

## 2016-11-03 ENCOUNTER — Inpatient Hospital Stay (HOSPITAL_COMMUNITY): Payer: Medicare Other

## 2016-11-03 LAB — GLUCOSE, CAPILLARY
GLUCOSE-CAPILLARY: 232 mg/dL — AB (ref 65–99)
GLUCOSE-CAPILLARY: 118 mg/dL — AB (ref 65–99)
GLUCOSE-CAPILLARY: 171 mg/dL — AB (ref 65–99)
GLUCOSE-CAPILLARY: 140 mg/dL — AB (ref 65–99)

## 2016-11-03 LAB — BASIC METABOLIC PANEL
Anion gap: 8 (ref 5–15)
BUN: 5 mg/dL — AB (ref 6–20)
CHLORIDE: 105 mmol/L (ref 101–111)
CO2: 24 mmol/L (ref 22–32)
CREATININE: 1.22 mg/dL — AB (ref 0.44–1.00)
Calcium: 8.2 mg/dL — ABNORMAL LOW (ref 8.9–10.3)
GFR calc Af Amer: 53 mL/min — ABNORMAL LOW (ref >60)
GFR calc non Af Amer: 45 mL/min — ABNORMAL LOW (ref >60)
GLUCOSE: 140 mg/dL — AB (ref 65–99)
POTASSIUM: 4.3 mmol/L (ref 3.5–5.1)
SODIUM: 137 mmol/L (ref 135–145)

## 2016-11-03 MED ORDER — METRONIDAZOLE 500 MG PO TABS
500.0000 mg | ORAL_TABLET | Freq: Three times a day (TID) | ORAL | Status: DC
  Administered 2016-11-03 – 2016-11-04 (×3): 500 mg via ORAL
  Filled 2016-11-03 (×3): qty 1

## 2016-11-03 MED ORDER — CIPROFLOXACIN HCL 250 MG PO TABS
500.0000 mg | ORAL_TABLET | Freq: Two times a day (BID) | ORAL | Status: DC
  Administered 2016-11-03 – 2016-11-04 (×2): 500 mg via ORAL
  Filled 2016-11-03 (×2): qty 2

## 2016-11-03 NOTE — Progress Notes (Signed)
[facility] Progress Note     Subjective: Having BM when I walked into the Room. Xray this AM with normal bowel gas pattern. Pain improved.   Objective: Vital signs in last 24 hours: Temp:  [97.6 F (36.4 C)-99.1 F (37.3 C)] 98.7 F (37.1 C) (10/13 0427) Pulse Rate:  [73-87] 73 (10/13 0427) Resp:  [20] 20 (10/13 0427) BP: (148-196)/(70-106) 148/70 (10/13 0427) SpO2:  [96 %-99 %] 97 % (10/13 0427) Last BM Date: 10/29/16  Intake/Output from previous day: 10/12 0701 - 10/13 0700 In: 527.5 [I.V.:427.5; IV Piggyback:100] Out: 600 [Urine:600] Intake/Output this shift: Total I/O In: 120 [P.O.:120] Out: -   General appearance: alert, cooperative and no distress Resp: normal work breathing GI: soft, mildly tender RLQ, no distention  Lab Results:   Recent Labs  11/01/16 0500  WBC 9.2  HGB 13.5  HCT 41.7  PLT 244   BMET  Recent Labs  11/02/16 0543 11/03/16 0908  NA 137 137  K 3.8 4.3  CL 105 105  CO2 25 24  GLUCOSE 111* 140*  BUN 8 5*  CREATININE 1.16* 1.22*  CALCIUM 8.1* 8.2*   PT/INR No results for input(s): LABPROT, INR in the last 72 hours.  Studies/Results: Dg Abd 2 Views  Result Date: 11/03/2016 CLINICAL DATA:  Small bowel obstruction EXAM: ABDOMEN - 2 VIEW COMPARISON:  10/30/2016 FINDINGS: No gas dilated small bowel loops are seen. Gas is mainly seen within nondilated colon. No visible pneumoperitoneum. The upright film is degraded by motion and soft tissue attenuation. No concerning mass effect or calcification. Artifact from EKG leads. IMPRESSION: Normal bowel gas pattern. Electronically Signed   By: Monte Fantasia M.D.   On: 11/03/2016 08:14    Anti-infectives: Anti-infectives    Start     Dose/Rate Route Frequency Ordered Stop   10/30/16 2000  metroNIDAZOLE (FLAGYL) IVPB 500 mg     500 mg 100 mL/hr over 60 Minutes Intravenous Every 8 hours 10/30/16 1801     10/30/16 1830  ciprofloxacin (CIPRO) IVPB 400 mg     400 mg 200  mL/hr over 60 Minutes Intravenous Every 12 hours 10/30/16 1800     10/30/16 1645  ciprofloxacin (CIPRO) IVPB 400 mg  Status:  Discontinued     400 mg 200 mL/hr over 60 Minutes Intravenous Every 12 hours 10/30/16 1631 10/30/16 1800   10/30/16 1645  metroNIDAZOLE (FLAGYL) IVPB 500 mg  Status:  Discontinued     500 mg 100 mL/hr over 60 Minutes Intravenous Every 8 hours 10/30/16 1631 10/30/16 1801      Assessment/Plan: Cassandra Jordan 66 yo with colitis causing SBO now resolving with antibiotics. Doing better. Having Bms and tolerating liquids.  - Adv as tolerated - Ambulate - Will need outpatient follow up regarding gallbladder calcifications for discussion of risk /benefits of cholecystectomy - Will need GI follow up outpatient for colitis/ discussion regarding need for repeat colonoscopy sooner than her scheduled repeat    LOS: 5 days    Virl Cagey 11/03/2016

## 2016-11-03 NOTE — Progress Notes (Signed)
PROGRESS NOTE  Cassandra Jordan AES:[id number] DOB: [birth year] DOA: 10/29/2016 PCP: Lemmie Evens, MD  Brief History: 66 year old woman admitted from home on 10/8 with complaints of abdominal pain. She has had a history of a hysterectomy many years ago as well as a right nephrectomy for renal cell cancer. She has been having right lower quadrant pain with nausea and vomiting. CT abdomen showed a small bowel obstruction with mucosal thickening of the distal ileum and posterior aspect of the cecum. NG was placed and General surgery was consulted to assist.  Assessment/Plan: Small bowel obstruction -Status post NG placement-->removed -10/29/2016 CT abdomen and pelvis--mucosal thickening of the distal ileum and cecum causing stenosis in the distal ileum--likely the cause of SBO; cholelithiasis with gallbladder wall calcifications  -ContinueCipro and Flagyl -->WBC improving -11/01/16--case discussed with Dr. Dimple Casey acute intervention about GB wall calcification--outpt follow up -pt will need outpt GI follow up after resolution of colitis -02/09/15 colonoscopy--unmarkable except diminutive polyp -11/03/16-order abd xray--normal bowel gas pattern -advanced to soft diet -increase ambulation  Infectious colitis -continue cipro and flagyl--switch to po  AKI -due to volume depletion -baseline creatinine 0.9-1.1 -serum creatinine peaked 1.53 -improving with IVF  Hypokalemia/Hypomagnesemia -replete -added KCl to IVF  Nodular thickening Abd Wall -follow up with urology  Chronic hepatitis C -Continue outpatient follow-up with GI.  Essential hypertension -continue IV lopressor>>po -continueIV hydralazine>>po -increase amlodipine -will not restart clonidine due to risk of severe rebound HTN if pt NPO again -will not restart lisinopril/HCTZ due to AKI  PSVT -IV metoprolol>>po -restarted amiodarone  GERD -PPI.  Type 2 diabetes mellitus -CBGs rising due  to increased po intake -change CBGs to ac/hs -check A1C -novolog sliding scale   Disposition Plan: Home 10/14 if stable Family Communication: No family present  Consultants: General surgery  Code Status: FULL  DVT Prophylaxis: SCDs   Procedures: As Listed in Progress Note Above  Antibiotics: cipro 10/9>>> Flagyl 10/9>>>     Subjective: Patient passed flatus and had a bowel movement today. She still has some intermittent abdominal pain. It continues to improve. She denies fevers, chest, chest pain, short of breath, vomiting, dysuria, hematuria, headache, neck pain.  Objective: Vitals:   11/02/16 1300 11/02/16 2111 11/03/16 0427 11/03/16 1309  BP: (!) 196/100 (!) 196/106 (!) 148/70 (!) 155/89  Pulse: 78 87 73 78  Resp: 20 20 20 20   Temp: 97.6 F (36.4 C) 99.1 F (37.3 C) 98.7 F (37.1 C) 98.2 F (36.8 C)  TempSrc: Oral Oral Oral Oral  SpO2: 97% 99% 97% 98%  Weight:      Height:        Intake/Output Summary (Last 24 hours) at 11/03/16 1758 Last data filed at 11/03/16 1200  Gross per 24 hour  Intake              360 ml  Output             1200 ml  Net             -840 ml   Weight change:  Exam:   General:  Pt is alert, follows commands appropriately, not in acute distress  HEENT: No icterus, No thrush, No neck mass, [place]/AT  Cardiovascular: RRR, S1/S2, no rubs, no gallops  Respiratory: bibasilar crackles, no wheeze  Abdomen: Soft/+BS, periumbilical tender, non distended, no guarding  Extremities: No edema, No lymphangitis, No petechiae, No rashes, no synovitis   Data Reviewed: I have personally reviewed following  labs and imaging studies Basic Metabolic Panel:  Recent Labs Lab 10/30/16 0444 10/30/16 1400 10/31/16 0540 11/01/16 0500 11/02/16 0543 11/03/16 0908  NA 137  --  136 134* 137 137  K 3.4*  --  3.8 3.1* 3.8 4.3  CL 98*  --  100* 99* 105 105  CO2 29  --  26 26 25 24   GLUCOSE 83  --  72 111* 111* 140*  BUN 25*  --   16 10 8  5*  CREATININE 1.28*  --  1.03* 1.05* 1.16* 1.22*  CALCIUM 8.2*  --  8.2* 8.2* 8.1* 8.2*  MG  --  1.6*  --  1.4* 1.9  --    Liver Function Tests:  Recent Labs Lab 10/29/16 1445 10/30/16 0444 10/31/16 0540 11/01/16 0500  AST 22 22 24 23   ALT 17 14 13* 14  ALKPHOS 44 39 33* 32*  BILITOT 1.1 0.7 0.9 0.5  PROT 8.1 6.9 6.5 6.4*  ALBUMIN 3.1* 2.7* 2.4* 2.4*    Recent Labs Lab 10/29/16 1445  LIPASE 19   No results for input(s): AMMONIA in the last 168 hours. Coagulation Profile: No results for input(s): INR, PROTIME in the last 168 hours. CBC:  Recent Labs Lab 10/29/16 1445 10/30/16 0444 10/31/16 0540 11/01/16 0500  WBC 18.9* 14.2* 10.8* 9.2  NEUTROABS  --  7.6 6.3 5.9  HGB 15.2* 13.6 13.4 13.5  HCT 46.4* 42.8 41.4 41.7  MCV 90.6 91.1 89.2 88.0  PLT 187 190 216 244   Cardiac Enzymes: No results for input(s): CKTOTAL, CKMB, CKMBINDEX, TROPONINI in the last 168 hours. BNP: Invalid input(s): POCBNP CBG:  Recent Labs Lab 11/02/16 1641 11/02/16 2107 11/03/16 0726 11/03/16 1139 11/03/16 1637  GLUCAP 156* 139* 232* 118* 171*   HbA1C: No results for input(s): HGBA1C in the last 72 hours. Urine analysis:    Component Value Date/Time   COLORURINE YELLOW 10/29/2016 1455   APPEARANCEUR CLEAR 10/29/2016 1455   LABSPEC 1.025 10/29/2016 1455   PHURINE 6.0 10/29/2016 1455   GLUCOSEU 100 (A) 10/29/2016 1455   HGBUR SMALL (A) 10/29/2016 1455   BILIRUBINUR MODERATE (A) 10/29/2016 1455   KETONESUR NEGATIVE 10/29/2016 1455   PROTEINUR >300 (A) 10/29/2016 1455   UROBILINOGEN 0.2 06/30/2014 2000   NITRITE NEGATIVE 10/29/2016 1455   LEUKOCYTESUR NEGATIVE 10/29/2016 1455   Sepsis Labs: @LABRCNTIP (procalcitonin:4,lacticidven:4) )No results found for this or any previous visit (from the past 240 hour(s)).   Scheduled Meds: . amiodarone  100 mg Oral Daily  . amLODipine  5 mg Oral Daily  . ciprofloxacin  500 mg Oral BID  . clotrimazole  1 Applicatorful Vaginal  QHS  . gabapentin  400 mg Oral TID  . insulin aspart  0-5 Units Subcutaneous QHS  . insulin aspart  0-9 Units Subcutaneous TID WC  . metoprolol succinate  50 mg Oral BID  . metroNIDAZOLE  500 mg Oral Q8H  . pantoprazole  40 mg Oral Daily   Continuous Infusions: . 0.9 % NaCl with KCl 40 mEq / L Stopped (11/03/16 0434)    Procedures/Studies: Ct Abdomen Pelvis Wo Contrast  Result Date: 10/29/2016 CLINICAL DATA:  Right-sided abdominal pain, nausea vomiting and diarrhea. Burning with urination for 3 days. History of renal cancer, post nephrectomy. EXAM: CT ABDOMEN AND PELVIS WITHOUT CONTRAST TECHNIQUE: Multidetector CT imaging of the abdomen and pelvis was performed following the standard protocol without IV contrast. COMPARISON:  Body CT 11/29/2015 FINDINGS: Lower chest: Calcific atherosclerotic disease of the coronary arteries. Calcifications of the  aortic valve annulus. Hepatobiliary: No focal liver abnormality is seen. No gallbladder wall thickening. Again noted of layering gallstones with calcifications of the gallbladder wall. No associated biliary ductal dilation. Pancreas: Unremarkable. No pancreatic ductal dilatation or surrounding inflammatory changes. Spleen: Normal in size without focal abnormality. Adrenals/Urinary Tract: Post right nephrectomy the previously demonstrated probable left renal cyst is not seen likely due to lack of IV contrast. Adrenal glands are normal. Stomach/Bowel: Moderate dilation of small bowel loops in the mid and lower abdomen containing gas fluid levels, with relatively gradual tapering to normal caliber in the distal ileum. Maximum transverse diameter of the dilated small bowel is 3.7 cm. Asymmetric mucosal thickening of the posterior aspect of the cecum, with few scattered wall calcifications, best seen on image 31/82, sequence 2. Mucosal thickening of the distal ileum, best seen on image 48/82, sequence 2. Mesenteric stranding, most prominent in the right mid  abdomen. Left colonic diverticulosis. Vascular/Lymphatic: Calcific atherosclerotic disease of the aorta. Numerous shotty mesenteric lymph nodes. Reproductive: Prostate is unremarkable. Other: Right lateral anterior abdominal wall cutaneous/subcutaneous thickening possibly represents a surgical scarring. There is a subcutaneous soft tissue nodularity within the inferior portion of this abnormality, image 69/82 sequence 2 Musculoskeletal: No acute or significant osseous findings. L5-S1 osteoarthritic changes. IMPRESSION: Post right nephrectomy. Small bowel obstruction with relatively gradual tapering to normal caliber at the level of the distal ileum. Mucosal thickening of the distal ileum and posterior aspect of the cecum, causing stenosis of the distal ileum and likely the cause of the small bowel obstruction. Associated mesenteric stranding and mesenteric lymph node enlargement. These findings may represent infectious colitis, however metastatic deposits or primary malignancy cannot be excluded. Right lateral anterior abdominal wall cutaneous/subcutaneous thickening, possibly representing a surgical scarring with subcutaneous soft tissue nodularity of its inferior portion. Subcutaneous metastatic deposits can't be excluded with this appearance. Cholelithiasis with associated gallbladder wall calcifications. Consider surgical consultation, as gallbladder wall calcifications may be a sign of malignancy. Calcific atherosclerotic disease of the aorta. These results were called by telephone at the time of interpretation on 10/29/2016 at 10:03 pm to Dr. Noemi Chapel , who verbally acknowledged these results. Electronically Signed   By: Fidela Salisbury M.D.   On: 10/29/2016 22:03   Dg Chest Portable 1 View  Result Date: 10/29/2016 CLINICAL DATA:  Nasogastric tube placement, history hypertension, asthma, diabetes mellitus, former smoker EXAM: PORTABLE CHEST 1 VIEW COMPARISON:  Portable exam 8502 hours compared  01/27/2016 FINDINGS: Nasogastric tube extends into stomach. Upper normal heart size. Mediastinal contours and pulmonary vascularity normal. Mild RIGHT basilar atelectasis. Lungs otherwise clear. No pleural effusion or pneumothorax. IMPRESSION: Nasogastric tube projects over proximal stomach. RIGHT basilar atelectasis. Electronically Signed   By: Lavonia Dana M.D.   On: 10/29/2016 23:40   Dg Abd 2 Views  Result Date: 11/03/2016 CLINICAL DATA:  Small bowel obstruction EXAM: ABDOMEN - 2 VIEW COMPARISON:  10/30/2016 FINDINGS: No gas dilated small bowel loops are seen. Gas is mainly seen within nondilated colon. No visible pneumoperitoneum. The upright film is degraded by motion and soft tissue attenuation. No concerning mass effect or calcification. Artifact from EKG leads. IMPRESSION: Normal bowel gas pattern. Electronically Signed   By: Monte Fantasia M.D.   On: 11/03/2016 08:14   Dg Abd 2 Views  Result Date: 10/30/2016 CLINICAL DATA:  Lower abdominal pain. History of renal malignancy, small bowel obstruction demonstrated on CT scan yesterday. EXAM: ABDOMEN - 2 VIEW COMPARISON:  Abdominopelvic CT scan of October 29, 2016 FINDINGS: There  are loops of minimally distended gas-filled small bowel in the mid and lower abdomen. There is gas and stool within normal caliber colon. No free extraluminal gas collections are observed. The esophagogastric tube tip and proximal port lie in the gastric body. No free extraluminal gas collections are observed. IMPRESSION: Decreased distention of small-bowel loops but persistent ileus or partial obstruction is present. No evidence of perforation. Electronically Signed   By: Deslyn Cavenaugh  [place] M.D.   On: 10/30/2016 08:08    Keland Peyton, DO  [facility] Pager [phone]  If 7PM-7AM, please contact night-coverage [email or web address] Password [email or web address] 11/03/2016, 5:58 PM   LOS: 5 days

## 2016-11-04 DIAGNOSIS — N183 Chronic kidney disease, stage 3 unspecified: Secondary | ICD-10-CM

## 2016-11-04 DIAGNOSIS — N179 Acute kidney failure, unspecified: Secondary | ICD-10-CM

## 2016-11-04 LAB — GLUCOSE, CAPILLARY
GLUCOSE-CAPILLARY: 138 mg/dL — AB (ref 65–99)
GLUCOSE-CAPILLARY: 201 mg/dL — AB (ref 65–99)

## 2016-11-04 LAB — BASIC METABOLIC PANEL
ANION GAP: 9 (ref 5–15)
BUN: 8 mg/dL (ref 6–20)
CO2: 25 mmol/L (ref 22–32)
Calcium: 8.6 mg/dL — ABNORMAL LOW (ref 8.9–10.3)
Chloride: 104 mmol/L (ref 101–111)
Creatinine, Ser: 1.2 mg/dL — ABNORMAL HIGH (ref 0.44–1.00)
GFR, EST AFRICAN AMERICAN: 54 mL/min — AB (ref >60)
GFR, EST NON AFRICAN AMERICAN: 46 mL/min — AB (ref >60)
Glucose, Bld: 181 mg/dL — ABNORMAL HIGH (ref 65–99)
POTASSIUM: 4.1 mmol/L (ref 3.5–5.1)
SODIUM: 138 mmol/L (ref 135–145)

## 2016-11-04 MED ORDER — AMLODIPINE BESYLATE 5 MG PO TABS: 10.0000 mg | ORAL_TABLET | Freq: Every day | ORAL | Status: DC

## 2016-11-04 MED ORDER — METRONIDAZOLE 500 MG PO TABS: 500.0000 mg | ORAL_TABLET | Freq: Three times a day (TID) | ORAL | 0 refills | Status: DC

## 2016-11-04 MED ORDER — CIPROFLOXACIN HCL 500 MG PO TABS: 500.0000 mg | ORAL_TABLET | Freq: Two times a day (BID) | ORAL | 0 refills | Status: DC

## 2016-11-04 MED ORDER — AMLODIPINE BESYLATE 10 MG PO TABS: 10.0000 mg | ORAL_TABLET | Freq: Every day | ORAL | 1 refills | Status: AC

## 2016-11-04 MED ORDER — METOPROLOL SUCCINATE ER 50 MG PO TB24: 50.0000 mg | ORAL_TABLET | Freq: Two times a day (BID) | ORAL | 1 refills | Status: DC

## 2016-11-04 MED ORDER — AMLODIPINE BESYLATE 5 MG PO TABS
10.0000 mg | ORAL_TABLET | Freq: Every day | ORAL | Status: DC
  Administered 2016-11-04: 10 mg via ORAL

## 2016-11-04 NOTE — Discharge Summary (Signed)
Physician Discharge Summary  Cassandra Jordan XYI:[id number] DOB: [birth year] DOA: 10/29/2016  PCP: Lemmie Evens, MD  Admit date: 10/29/2016 Discharge date: 11/04/2016  Admitted From: Home Disposition:  Home   Recommendations for Outpatient Follow-up:  1. Follow up with PCP in 1-2 weeks 2. Please obtain BMP/CBC in one week   Discharge Condition: Stable CODE STATUS:FULL Diet recommendation: Heart Healthy / Carb Modified   Brief/Interim Summary: 66 year old woman admitted from home on 10/8 with complaints of abdominal pain. She has had a history of a hysterectomy many years ago as well as a right nephrectomy for renal cell cancer. She has been having right lower quadrant pain with nausea and vomiting. CT abdomen showed a small bowel obstruction with mucosal thickening of the distal ileum and posterior aspect of the cecum.  She was placed on bowel rest. NG was placed and General surgery was consulted to assist.  Discharge Diagnoses:  Small bowel obstruction -Status post NG placement-->removed -10/29/2016 CT abdomen and pelvis--mucosal thickening of the distal ileum and cecum causing stenosis in the distal ileum--likely the cause of SBO; cholelithiasis with gallbladder wall calcifications  -ContinueCipro and Flagyl -->WBC improving -11/01/16--case discussed with Dr. Dimple Casey acute intervention about GB wall calcification--outpt follow up -pt will need outpt GI follow up after resolution of colitis -02/09/15 colonoscopy--unmarkable except diminutive polyp -11/03/16-order abd xray--normal bowel gas pattern -advanced to soft diet-->tolerated well -increase ambulation  Infectious colitis -continue cipro and flagyl--switch to po -d/c home with 5 more days of abx to complete 10 day course  Acute on chronic renal failure--CKD 3 -due to volume depletion -baseline creatinine 0.9-1.2 -serum creatinine peaked 1.53 -improving with IVF -serum creatinine 1.20 on day of  discharge  Hypokalemia/Hypomagnesemia -repleted -added KCl to IVF  Nodular thickening Abd Wall -follow up with urology  Chronic hepatitis C -Continue outpatient follow-up with GI.  Essential hypertension -continue IV lopressor>>po -continueIV hydralazine during hospitalization -increaseamlodipine to 10 mg daily -will not restart clonidine due to rebound HTN if npo again--she has not refilled since June 2018 -will not restart lisinopril/HCTZ due to AKI  PSVT -IV metoprolol>>po -restarted amiodarone  GERD -PPI.  Type 2 diabetes mellitus -CBGs rising due to increased po intake -change CBGs to ac/hs -check A1C -novolog sliding scale   Discharge Instructions  Discharge Instructions    Diet - low sodium heart healthy    Complete by:  As directed    Increase activity slowly    Complete by:  As directed      Allergies as of 11/04/2016      Reactions   Sulfa Antibiotics Itching, Other (See Comments)   burning      Medication List    STOP taking these medications   cephALEXin 500 MG capsule Commonly known as:  KEFLEX   cloNIDine 0.1 MG tablet Commonly known as:  CATAPRES   hydrochlorothiazide 12.5 MG tablet Commonly known as:  HYDRODIURIL   lisinopril-hydrochlorothiazide 20-25 MG tablet Commonly known as:  PRINZIDE,ZESTORETIC   LORazepam 1 MG tablet Commonly known as:  ATIVAN   potassium chloride SA 20 MEQ tablet Commonly known as:  K-DUR,KLOR-CON     TAKE these medications   amiodarone 100 MG tablet Commonly known as:  PACERONE TAKE ONE TABLET BY MOUTH ONCE DAILY -   amLODipine 10 MG tablet Commonly known as:  NORVASC Take 1 tablet (10 mg total) by mouth daily.   ciprofloxacin 500 MG tablet Commonly known as:  CIPRO Take 1 tablet (500 mg total) by mouth 2 (two) times daily. What  changed:  when to take this   gabapentin 300 MG capsule Commonly known as:  NEURONTIN Take 1 capsule (300 mg total) by mouth 2 (two) times daily. What  changed:  how much to take  when to take this   insulin detemir 100 UNIT/ML injection Commonly known as:  LEVEMIR Inject 3 Units into the skin 3 (three) times daily.   Melatonin 1 MG Caps Take 1 capsule (1 mg total) by mouth at bedtime as needed (sleep). What changed:  how much to take   metoprolol succinate 50 MG 24 hr tablet Commonly known as:  TOPROL-XL Take 1 tablet (50 mg total) by mouth 2 (two) times daily. Take with or immediately following a meal. What changed:  additional instructions   metroNIDAZOLE 500 MG tablet Commonly known as:  FLAGYL Take 1 tablet (500 mg total) by mouth every 8 (eight) hours. What changed:  when to take this   pantoprazole 40 MG tablet Commonly known as:  PROTONIX Take 1 tablet (40 mg total) by mouth daily. Take 30 minutes before breakfast       Allergies  Allergen Reactions  . Sulfa Antibiotics Itching and Other (See Comments)    burning    Consultations:  General surgery   Procedures/Studies: Ct Abdomen Pelvis Wo Contrast  Result Date: 10/29/2016 CLINICAL DATA:  Right-sided abdominal pain, nausea vomiting and diarrhea. Burning with urination for 3 days. History of renal cancer, post nephrectomy. EXAM: CT ABDOMEN AND PELVIS WITHOUT CONTRAST TECHNIQUE: Multidetector CT imaging of the abdomen and pelvis was performed following the standard protocol without IV contrast. COMPARISON:  Body CT 11/29/2015 FINDINGS: Lower chest: Calcific atherosclerotic disease of the coronary arteries. Calcifications of the aortic valve annulus. Hepatobiliary: No focal liver abnormality is seen. No gallbladder wall thickening. Again noted of layering gallstones with calcifications of the gallbladder wall. No associated biliary ductal dilation. Pancreas: Unremarkable. No pancreatic ductal dilatation or surrounding inflammatory changes. Spleen: Normal in size without focal abnormality. Adrenals/Urinary Tract: Post right nephrectomy the previously demonstrated  probable left renal cyst is not seen likely due to lack of IV contrast. Adrenal glands are normal. Stomach/Bowel: Moderate dilation of small bowel loops in the mid and lower abdomen containing gas fluid levels, with relatively gradual tapering to normal caliber in the distal ileum. Maximum transverse diameter of the dilated small bowel is 3.7 cm. Asymmetric mucosal thickening of the posterior aspect of the cecum, with few scattered wall calcifications, best seen on image 31/82, sequence 2. Mucosal thickening of the distal ileum, best seen on image 48/82, sequence 2. Mesenteric stranding, most prominent in the right mid abdomen. Left colonic diverticulosis. Vascular/Lymphatic: Calcific atherosclerotic disease of the aorta. Numerous shotty mesenteric lymph nodes. Reproductive: Prostate is unremarkable. Other: Right lateral anterior abdominal wall cutaneous/subcutaneous thickening possibly represents a surgical scarring. There is a subcutaneous soft tissue nodularity within the inferior portion of this abnormality, image 69/82 sequence 2 Musculoskeletal: No acute or significant osseous findings. L5-S1 osteoarthritic changes. IMPRESSION: Post right nephrectomy. Small bowel obstruction with relatively gradual tapering to normal caliber at the level of the distal ileum. Mucosal thickening of the distal ileum and posterior aspect of the cecum, causing stenosis of the distal ileum and likely the cause of the small bowel obstruction. Associated mesenteric stranding and mesenteric lymph node enlargement. These findings may represent infectious colitis, however metastatic deposits or primary malignancy cannot be excluded. Right lateral anterior abdominal wall cutaneous/subcutaneous thickening, possibly representing a surgical scarring with subcutaneous soft tissue nodularity of its inferior portion. Subcutaneous  metastatic deposits can't be excluded with this appearance. Cholelithiasis with associated gallbladder wall  calcifications. Consider surgical consultation, as gallbladder wall calcifications may be a sign of malignancy. Calcific atherosclerotic disease of the aorta. These results were called by telephone at the time of interpretation on 10/29/2016 at 10:03 pm to Dr. Noemi Chapel , who verbally acknowledged these results. Electronically Signed   By: Fidela Salisbury M.D.   On: 10/29/2016 22:03   Dg Chest Portable 1 View  Result Date: 10/29/2016 CLINICAL DATA:  Nasogastric tube placement, history hypertension, asthma, diabetes mellitus, former smoker EXAM: PORTABLE CHEST 1 VIEW COMPARISON:  Portable exam 7681 hours compared 01/27/2016 FINDINGS: Nasogastric tube extends into stomach. Upper normal heart size. Mediastinal contours and pulmonary vascularity normal. Mild RIGHT basilar atelectasis. Lungs otherwise clear. No pleural effusion or pneumothorax. IMPRESSION: Nasogastric tube projects over proximal stomach. RIGHT basilar atelectasis. Electronically Signed   By: Lavonia Dana M.D.   On: 10/29/2016 23:40   Dg Abd 2 Views  Result Date: 11/03/2016 CLINICAL DATA:  Small bowel obstruction EXAM: ABDOMEN - 2 VIEW COMPARISON:  10/30/2016 FINDINGS: No gas dilated small bowel loops are seen. Gas is mainly seen within nondilated colon. No visible pneumoperitoneum. The upright film is degraded by motion and soft tissue attenuation. No concerning mass effect or calcification. Artifact from EKG leads. IMPRESSION: Normal bowel gas pattern. Electronically Signed   By: Monte Fantasia M.D.   On: 11/03/2016 08:14   Dg Abd 2 Views  Result Date: 10/30/2016 CLINICAL DATA:  Lower abdominal pain. History of renal malignancy, small bowel obstruction demonstrated on CT scan yesterday. EXAM: ABDOMEN - 2 VIEW COMPARISON:  Abdominopelvic CT scan of October 29, 2016 FINDINGS: There are loops of minimally distended gas-filled small bowel in the mid and lower abdomen. There is gas and stool within normal caliber colon. No free extraluminal  gas collections are observed. The esophagogastric tube tip and proximal port lie in the gastric body. No free extraluminal gas collections are observed. IMPRESSION: Decreased distention of small-bowel loops but persistent ileus or partial obstruction is present. No evidence of perforation. Electronically Signed   By: Rifky Lapre  [place] M.D.   On: 10/30/2016 08:08        Discharge Exam: Vitals:   11/03/16 2130 11/04/16 0436  BP: (!) 193/74 (!) 186/88  Pulse: 75 71  Resp: 20 16  Temp: 98.6 F (37 C) 98.5 F (36.9 C)  SpO2: 99% 99%   Vitals:   11/03/16 0427 11/03/16 1309 11/03/16 2130 11/04/16 0436  BP: (!) 148/70 (!) 155/89 (!) 193/74 (!) 186/88  Pulse: 73 78 75 71  Resp: 20 20 20 16   Temp: 98.7 F (37.1 C) 98.2 F (36.8 C) 98.6 F (37 C) 98.5 F (36.9 C)  TempSrc: Oral Oral Oral Oral  SpO2: 97% 98% 99% 99%  Weight:      Height:        General: Pt is alert, awake, not in acute distress Cardiovascular: RRR, S1/S2 +, no rubs, no gallops Respiratory: CTA bilaterally, no wheezing, no rhonchi Abdominal: Soft, NT, ND, bowel sounds + Extremities: no edema, no cyanosis   The results of significant diagnostics from this hospitalization (including imaging, microbiology, ancillary and laboratory) are listed below for reference.    Significant Diagnostic Studies: Ct Abdomen Pelvis Wo Contrast  Result Date: 10/29/2016 CLINICAL DATA:  Right-sided abdominal pain, nausea vomiting and diarrhea. Burning with urination for 3 days. History of renal cancer, post nephrectomy. EXAM: CT ABDOMEN AND PELVIS WITHOUT CONTRAST TECHNIQUE: Multidetector CT  imaging of the abdomen and pelvis was performed following the standard protocol without IV contrast. COMPARISON:  Body CT 11/29/2015 FINDINGS: Lower chest: Calcific atherosclerotic disease of the coronary arteries. Calcifications of the aortic valve annulus. Hepatobiliary: No focal liver abnormality is seen. No gallbladder wall thickening. Again noted of  layering gallstones with calcifications of the gallbladder wall. No associated biliary ductal dilation. Pancreas: Unremarkable. No pancreatic ductal dilatation or surrounding inflammatory changes. Spleen: Normal in size without focal abnormality. Adrenals/Urinary Tract: Post right nephrectomy the previously demonstrated probable left renal cyst is not seen likely due to lack of IV contrast. Adrenal glands are normal. Stomach/Bowel: Moderate dilation of small bowel loops in the mid and lower abdomen containing gas fluid levels, with relatively gradual tapering to normal caliber in the distal ileum. Maximum transverse diameter of the dilated small bowel is 3.7 cm. Asymmetric mucosal thickening of the posterior aspect of the cecum, with few scattered wall calcifications, best seen on image 31/82, sequence 2. Mucosal thickening of the distal ileum, best seen on image 48/82, sequence 2. Mesenteric stranding, most prominent in the right mid abdomen. Left colonic diverticulosis. Vascular/Lymphatic: Calcific atherosclerotic disease of the aorta. Numerous shotty mesenteric lymph nodes. Reproductive: Prostate is unremarkable. Other: Right lateral anterior abdominal wall cutaneous/subcutaneous thickening possibly represents a surgical scarring. There is a subcutaneous soft tissue nodularity within the inferior portion of this abnormality, image 69/82 sequence 2 Musculoskeletal: No acute or significant osseous findings. L5-S1 osteoarthritic changes. IMPRESSION: Post right nephrectomy. Small bowel obstruction with relatively gradual tapering to normal caliber at the level of the distal ileum. Mucosal thickening of the distal ileum and posterior aspect of the cecum, causing stenosis of the distal ileum and likely the cause of the small bowel obstruction. Associated mesenteric stranding and mesenteric lymph node enlargement. These findings may represent infectious colitis, however metastatic deposits or primary malignancy cannot  be excluded. Right lateral anterior abdominal wall cutaneous/subcutaneous thickening, possibly representing a surgical scarring with subcutaneous soft tissue nodularity of its inferior portion. Subcutaneous metastatic deposits can't be excluded with this appearance. Cholelithiasis with associated gallbladder wall calcifications. Consider surgical consultation, as gallbladder wall calcifications may be a sign of malignancy. Calcific atherosclerotic disease of the aorta. These results were called by telephone at the time of interpretation on 10/29/2016 at 10:03 pm to Dr. Noemi Chapel , who verbally acknowledged these results. Electronically Signed   By: Fidela Salisbury M.D.   On: 10/29/2016 22:03   Dg Chest Portable 1 View  Result Date: 10/29/2016 CLINICAL DATA:  Nasogastric tube placement, history hypertension, asthma, diabetes mellitus, former smoker EXAM: PORTABLE CHEST 1 VIEW COMPARISON:  Portable exam 4627 hours compared 01/27/2016 FINDINGS: Nasogastric tube extends into stomach. Upper normal heart size. Mediastinal contours and pulmonary vascularity normal. Mild RIGHT basilar atelectasis. Lungs otherwise clear. No pleural effusion or pneumothorax. IMPRESSION: Nasogastric tube projects over proximal stomach. RIGHT basilar atelectasis. Electronically Signed   By: Lavonia Dana M.D.   On: 10/29/2016 23:40   Dg Abd 2 Views  Result Date: 11/03/2016 CLINICAL DATA:  Small bowel obstruction EXAM: ABDOMEN - 2 VIEW COMPARISON:  10/30/2016 FINDINGS: No gas dilated small bowel loops are seen. Gas is mainly seen within nondilated colon. No visible pneumoperitoneum. The upright film is degraded by motion and soft tissue attenuation. No concerning mass effect or calcification. Artifact from EKG leads. IMPRESSION: Normal bowel gas pattern. Electronically Signed   By: Monte Fantasia M.D.   On: 11/03/2016 08:14   Dg Abd 2 Views  Result Date: 10/30/2016 CLINICAL  DATA:  Lower abdominal pain. History of renal  malignancy, small bowel obstruction demonstrated on CT scan yesterday. EXAM: ABDOMEN - 2 VIEW COMPARISON:  Abdominopelvic CT scan of October 29, 2016 FINDINGS: There are loops of minimally distended gas-filled small bowel in the mid and lower abdomen. There is gas and stool within normal caliber colon. No free extraluminal gas collections are observed. The esophagogastric tube tip and proximal port lie in the gastric body. No free extraluminal gas collections are observed. IMPRESSION: Decreased distention of small-bowel loops but persistent ileus or partial obstruction is present. No evidence of perforation. Electronically Signed   By: Gabriell Casimir  [place] M.D.   On: 10/30/2016 08:08     Microbiology: No results found for this or any previous visit (from the past 240 hour(s)).   Labs: Basic Metabolic Panel:  Recent Labs Lab 10/30/16 1400 10/31/16 0540 11/01/16 0500 11/02/16 0543 11/03/16 0908 11/04/16 0331  NA  --  136 134* 137 137 138  K  --  3.8 3.1* 3.8 4.3 4.1  CL  --  100* 99* 105 105 104  CO2  --  26 26 25 24 25   GLUCOSE  --  72 111* 111* 140* 181*  BUN  --  16 10 8  5* 8  CREATININE  --  1.03* 1.05* 1.16* 1.22* 1.20*  CALCIUM  --  8.2* 8.2* 8.1* 8.2* 8.6*  MG 1.6*  --  1.4* 1.9  --   --    Liver Function Tests:  Recent Labs Lab 10/29/16 1445 10/30/16 0444 10/31/16 0540 11/01/16 0500  AST 22 22 24 23   ALT 17 14 13* 14  ALKPHOS 44 39 33* 32*  BILITOT 1.1 0.7 0.9 0.5  PROT 8.1 6.9 6.5 6.4*  ALBUMIN 3.1* 2.7* 2.4* 2.4*    Recent Labs Lab 10/29/16 1445  LIPASE 19   No results for input(s): AMMONIA in the last 168 hours. CBC:  Recent Labs Lab 10/29/16 1445 10/30/16 0444 10/31/16 0540 11/01/16 0500  WBC 18.9* 14.2* 10.8* 9.2  NEUTROABS  --  7.6 6.3 5.9  HGB 15.2* 13.6 13.4 13.5  HCT 46.4* 42.8 41.4 41.7  MCV 90.6 91.1 89.2 88.0  PLT 187 190 216 244   Cardiac Enzymes: No results for input(s): CKTOTAL, CKMB, CKMBINDEX, TROPONINI in the last 168  hours. BNP: Invalid input(s): POCBNP CBG:  Recent Labs Lab 11/03/16 0726 11/03/16 1139 11/03/16 1637 11/03/16 2024 11/04/16 0805  GLUCAP 232* 118* 171* 140* 138*    Time coordinating discharge:  Greater than 30 minutes  Signed:  Alfredia Desanctis, DO [facility] Pager: [phone] 11/04/2016, 10:46 AM

## 2016-11-04 NOTE — Progress Notes (Signed)
Patient discharged home with personal belongings, prescriptions, and IV removed and site intact.

## 2016-11-15 ENCOUNTER — Ambulatory Visit: Payer: Medicare Other | Admitting: Gastroenterology

## 2016-11-17 ENCOUNTER — Emergency Department (HOSPITAL_COMMUNITY): Payer: Medicare Other

## 2016-11-17 ENCOUNTER — Encounter (HOSPITAL_COMMUNITY): Payer: Self-pay | Admitting: *Deleted

## 2016-11-17 ENCOUNTER — Inpatient Hospital Stay (HOSPITAL_COMMUNITY)
Admission: EM | Admit: 2016-11-17 | Discharge: 2016-11-20 | DRG: 389 | Disposition: A | Discharge status: Home / Self Care | Payer: Medicare Other | Attending: Internal Medicine | Admitting: Internal Medicine

## 2016-11-17 DIAGNOSIS — Z85528 Personal history of other malignant neoplasm of kidney: Secondary | ICD-10-CM

## 2016-11-17 DIAGNOSIS — Z794 Long term (current) use of insulin: Secondary | ICD-10-CM

## 2016-11-17 DIAGNOSIS — E876 Hypokalemia: Secondary | ICD-10-CM | POA: Diagnosis present

## 2016-11-17 DIAGNOSIS — Z87891 Personal history of nicotine dependence: Secondary | ICD-10-CM

## 2016-11-17 DIAGNOSIS — K828 Other specified diseases of gallbladder: Secondary | ICD-10-CM

## 2016-11-17 DIAGNOSIS — K8020 Calculus of gallbladder without cholecystitis without obstruction: Secondary | ICD-10-CM | POA: Diagnosis not present

## 2016-11-17 DIAGNOSIS — K859 Acute pancreatitis without necrosis or infection, unspecified: Secondary | ICD-10-CM

## 2016-11-17 DIAGNOSIS — K5730 Diverticulosis of large intestine without perforation or abscess without bleeding: Secondary | ICD-10-CM | POA: Diagnosis not present

## 2016-11-17 DIAGNOSIS — K7460 Unspecified cirrhosis of liver: Secondary | ICD-10-CM | POA: Diagnosis present

## 2016-11-17 DIAGNOSIS — Z905 Acquired absence of kidney: Secondary | ICD-10-CM

## 2016-11-17 DIAGNOSIS — K567 Ileus, unspecified: Secondary | ICD-10-CM | POA: Diagnosis not present

## 2016-11-17 DIAGNOSIS — J45909 Unspecified asthma, uncomplicated: Secondary | ICD-10-CM | POA: Diagnosis present

## 2016-11-17 DIAGNOSIS — I129 Hypertensive chronic kidney disease with stage 1 through stage 4 chronic kidney disease, or unspecified chronic kidney disease: Secondary | ICD-10-CM | POA: Diagnosis present

## 2016-11-17 DIAGNOSIS — R1012 Left upper quadrant pain: Secondary | ICD-10-CM | POA: Diagnosis not present

## 2016-11-17 DIAGNOSIS — K5900 Constipation, unspecified: Secondary | ICD-10-CM | POA: Diagnosis not present

## 2016-11-17 DIAGNOSIS — E1165 Type 2 diabetes mellitus with hyperglycemia: Secondary | ICD-10-CM

## 2016-11-17 DIAGNOSIS — K219 Gastro-esophageal reflux disease without esophagitis: Secondary | ICD-10-CM | POA: Diagnosis present

## 2016-11-17 DIAGNOSIS — I160 Hypertensive urgency: Secondary | ICD-10-CM | POA: Diagnosis present

## 2016-11-17 DIAGNOSIS — E1122 Type 2 diabetes mellitus with diabetic chronic kidney disease: Secondary | ICD-10-CM | POA: Diagnosis present

## 2016-11-17 DIAGNOSIS — F1410 Cocaine abuse, uncomplicated: Secondary | ICD-10-CM | POA: Diagnosis present

## 2016-11-17 DIAGNOSIS — N183 Chronic kidney disease, stage 3 unspecified: Secondary | ICD-10-CM

## 2016-11-17 DIAGNOSIS — Z9114 Patient's other noncompliance with medication regimen: Secondary | ICD-10-CM

## 2016-11-17 DIAGNOSIS — I471 Supraventricular tachycardia: Secondary | ICD-10-CM | POA: Diagnosis present

## 2016-11-17 DIAGNOSIS — Z882 Allergy status to sulfonamides status: Secondary | ICD-10-CM

## 2016-11-17 DIAGNOSIS — I10 Essential (primary) hypertension: Secondary | ICD-10-CM | POA: Diagnosis present

## 2016-11-17 DIAGNOSIS — Z90710 Acquired absence of both cervix and uterus: Secondary | ICD-10-CM

## 2016-11-17 DIAGNOSIS — B182 Chronic viral hepatitis C: Secondary | ICD-10-CM | POA: Diagnosis present

## 2016-11-17 DIAGNOSIS — R109 Unspecified abdominal pain: Secondary | ICD-10-CM | POA: Diagnosis present

## 2016-11-17 DIAGNOSIS — K2970 Gastritis, unspecified, without bleeding: Secondary | ICD-10-CM | POA: Diagnosis not present

## 2016-11-17 DIAGNOSIS — R079 Chest pain, unspecified: Secondary | ICD-10-CM

## 2016-11-17 DIAGNOSIS — R112 Nausea with vomiting, unspecified: Secondary | ICD-10-CM | POA: Diagnosis not present

## 2016-11-17 DIAGNOSIS — E119 Type 2 diabetes mellitus without complications: Secondary | ICD-10-CM

## 2016-11-17 LAB — CBC WITH DIFFERENTIAL/PLATELET
BASOS ABS: 0 10*3/uL (ref 0.0–0.1)
BASOS PCT: 0 %
EOS ABS: 0.2 10*3/uL (ref 0.0–0.7)
Eosinophils Relative: 2 %
HEMATOCRIT: 40.9 % (ref 36.0–46.0)
HEMOGLOBIN: 13.3 g/dL (ref 12.0–15.0)
Lymphocytes Relative: 31 %
Lymphs Abs: 2.8 10*3/uL (ref 0.7–4.0)
MCH: 29 pg (ref 26.0–34.0)
MCHC: 32.5 g/dL (ref 30.0–36.0)
MCV: 89.3 fL (ref 78.0–100.0)
Monocytes Absolute: 0.9 10*3/uL (ref 0.1–1.0)
Monocytes Relative: 9 %
NEUTROS ABS: 5.2 10*3/uL (ref 1.7–7.7)
NEUTROS PCT: 58 %
Platelets: 260 10*3/uL (ref 150–400)
RBC: 4.58 MIL/uL (ref 3.87–5.11)
RDW: 13.2 % (ref 11.5–15.5)
WBC: 9.1 10*3/uL (ref 4.0–10.5)

## 2016-11-17 LAB — URINALYSIS, ROUTINE W REFLEX MICROSCOPIC
BILIRUBIN URINE: NEGATIVE
Glucose, UA: NEGATIVE mg/dL
HGB URINE DIPSTICK: NEGATIVE
KETONES UR: NEGATIVE mg/dL
LEUKOCYTES UA: NEGATIVE
NITRITE: NEGATIVE
PH: 8 (ref 5.0–8.0)
Protein, ur: >=300 mg/dL — AB
SPECIFIC GRAVITY, URINE: 1.008 (ref 1.005–1.030)

## 2016-11-17 NOTE — ED Triage Notes (Signed)
Pt arrived by EMS from home. Pt states abdominal pain started again today. Was seen here a week ago for the same. Pt states vomiting today, denies diarrhea.

## 2016-11-17 NOTE — ED Provider Notes (Signed)
[facility] EMERGENCY DEPARTMENT Provider Note   CSN: [id number] Arrival date & time: 11/17/16  2213     History   Chief Complaint Chief Complaint  Patient presents with  . Abdominal Pain    HPI Cassandra Jordan is a 66 y.o. female.  This patient is a 66 year old female with past medical history of PSVT, hypertension, asthma.  She has also undergone prior right nephrectomy and hysterectomy.  She presents for evaluation of abdominal pain.  This started earlier today and is worsening.  The pain is located mainly to the left abdomen but also radiates to the right.  She denies any diarrhea.  She does report some nausea but no vomiting.  She was recently admitted here for colitis and small bowel obstruction.  This resolved with antibiotics and a nasogastric tube.  Her pain this evening feels similar to what she experienced with her prior admission.   The history is provided by the patient.  Abdominal Pain   This is a recurrent problem. The current episode started 3 to 5 hours ago. The problem occurs constantly. The problem has been gradually worsening. The pain is associated with an unknown factor. The pain is located in the LLQ and LUQ. The quality of the pain is cramping. The pain is moderate. Associated symptoms include nausea. Pertinent negatives include fever, melena, vomiting, constipation, dysuria and frequency. Nothing aggravates the symptoms. Nothing relieves the symptoms.    Past Medical History:  Diagnosis Date  . Arthritis   . Asthma   . Cocaine abuse ([place])   . Essential hypertension   . GERD (gastroesophageal reflux disease)   . Hepatitis C   . Hyperlipidemia   . Lichen sclerosus et atrophicus 10/13/2014  . Neuropathic pain   . PSVT (paroxysmal supraventricular tachycardia) ([place])   . Rash   . Renal carcinoma ([place])   . Type 2 diabetes mellitus ([place])   . Vaginal irritation 10/01/2014  . Vaginal itching 10/01/2014  . Yeast infection of the vagina 10/01/2014    Patient  Active Problem List   Diagnosis Date Noted  . Acute renal failure superimposed on stage 3 chronic kidney disease ([place]) 11/04/2016  . Hypokalemia 11/01/2016  . Hypomagnesemia 11/01/2016  . SBO (small bowel obstruction) ([place])   . AKI (acute kidney injury) ([place]) 10/30/2016  . Leukocytosis 10/30/2016  . GERD (gastroesophageal reflux disease) 10/29/2016  . Small bowel obstruction ([place]) 10/29/2016  . History of drug use 07/17/2016  . Constipation 05/30/2016  . Fecal incontinence 01/04/2016  . Hepatic cirrhosis ([place]) 04/08/2015  . History of adenomatous polyp of colon   . History of colonic polyps   . Hx of adenomatous colonic polyps 12/10/2014  . Lichen sclerosus et atrophicus 10/13/2014  . Vaginal itching 10/01/2014  . Vaginal irritation 10/01/2014  . Yeast infection of the vagina 10/01/2014  . Blurred vision, right eye 03/20/2014  . UTI (lower urinary tract infection) 03/20/2014  . Cocaine abuse ([place]) 03/20/2014  . Generalized weakness 03/20/2014  . Chest pain 02/10/2014  . Chest pain at rest 02/10/2014  . Type 2 diabetes mellitus ([place])   . Diabetes mellitus, type II ([place])   . Tobacco abuse   . PSVT (paroxysmal supraventricular tachycardia) ([place]) 11/04/2013  . Essential hypertension 10/16/2013  . Renal mass 10/14/2013  . Ovarian mass, left 08/17/2013  . Hepatitis C, chronic ([place]) 07/01/2013  . Elevated LFTs 07/01/2013  . Other dysphagia 07/01/2013  . Encounter for screening colonoscopy 07/01/2013    Past Surgical History:  Procedure Laterality Date  .  ABDOMINAL HYSTERECTOMY    . COLONOSCOPY N/A 07/08/2013   RMR: Multiple colonic polyps treated [name] as described above. Inadequate prepartation comprimised examination there colonic diverticulosis. tubular and tubulovillous adenomas. next TCS 12/2013  . COLONOSCOPY N/A 02/09/2015   RMR: Colonic polyp removed as described above. Tubular adenoma. Next colonoscopy January 2022.  . ESOPHAGOGASTRODUODENOSCOPY N/A 07/08/2013   RMR:  Schatzki's ring ; small hiatal hernia-status post passage of a Maloney dilator  . MALONEY DILATION N/A 07/08/2013   Procedure: [name] DILATION;  Surgeon: [name], MD;  Location[facility];  Service: Endoscopy;  Laterality: N/A;  . ROBOT ASSISTED LAPAROSCOPIC NEPHRECTOMY Right 10/14/2013   Procedure: ROBOTIC ASSISTED LAPAROSCOPIC RADICAL NEPHRECTOMY;  Surgeon: [name], MD;  Location: [facility] OR[facility];  Service: Urology;  Laterality: Right;  . SAVORY DILATION N/A 07/08/2013   Procedure: SAVORY DILATION;  Surgeon: [name], MD;  Location: [facility];  Service: Endoscopy;  Laterality: N/A;  . TONSILLECTOMY    . Wart removal      OB History    Gravida Para Term Preterm AB Living   1 1       1    SAB TAB Ectopic Multiple Live Births                   Home Medications    Prior to Admission medications   Medication Sig Start Date End Date Taking? Authorizing Provider  amiodarone (PACERONE) 100 MG tablet TAKE ONE TABLET BY MOUTH ONCE DAILY - 11/16/15  Yes Satira Sark, MD  amLODipine (NORVASC) 10 MG tablet Take 1 tablet (10 mg total) by mouth daily. 11/04/16  Yes Tat, Shanon Brow, MD  ciprofloxacin (CIPRO) 500 MG tablet Take 1 tablet (500 mg total) by mouth 2 (two) times daily. 11/04/16  Yes Tat, Shanon Brow, MD  gabapentin (NEURONTIN) 300 MG capsule Take 1 capsule (300 mg total) by mouth 2 (two) times daily. Patient taking differently: Take 600 mg by mouth 3 (three) times daily.  11/06/14  Yes Francine Graven, DO  insulin detemir (LEVEMIR) 100 UNIT/ML injection Inject 3 Units into the skin 3 (three) times daily.    Yes [provider]  Melatonin 1 MG CAPS Take 1 capsule (1 mg total) by mouth at bedtime as needed (sleep). Patient taking differently: Take 3 mg by mouth at bedtime as needed (sleep).  01/27/16  Yes Mesner, Corene Cornea, MD  metoprolol succinate (TOPROL-XL) 50 MG 24 hr tablet Take 1 tablet (50 mg total) by mouth 2 (two) times daily. Take with or immediately following a  meal. 11/04/16  Yes Tat, David, MD  pantoprazole (PROTONIX) 40 MG tablet Take 1 tablet (40 mg total) by mouth daily. Take 30 minutes before breakfast 03/06/16  Yes Annitta Needs, NP  metroNIDAZOLE (FLAGYL) 500 MG tablet Take 1 tablet (500 mg total) by mouth every 8 (eight) hours. 11/04/16   Orson Eva, MD    Family History Family History  Problem Relation Age of Onset  . Colon cancer Sister 69  . Cancer Mother   . Heart attack Father   . Hypertension Daughter   . Cancer Maternal Grandfather   . Diabetes Paternal Grandmother     Social History Social History  Substance Use Topics  . Smoking status: Former Smoker    Packs/day: 0.10    Years: 40.00    Types: Cigarettes  . Smokeless tobacco: Never Used  . Alcohol use No     Comment: rarely     Allergies   Sulfa antibiotics  Review of Systems Review of Systems  Constitutional: Negative for fever.  Gastrointestinal: Positive for abdominal pain and nausea. Negative for constipation, melena and vomiting.  Genitourinary: Negative for dysuria and frequency.  All other systems reviewed and are negative.    Physical Exam Updated Vital Signs BP (!) 233/129 (BP Location: Right Arm)   Pulse 96   Temp 98.9 F (37.2 C) (Oral)   Resp 20   Ht 5' (1.524 m)   Wt 63.5 kg (140 lb)   SpO2 95%   BMI 27.34 kg/m   Physical Exam  Constitutional: She is oriented to person, place, and time. She appears well-developed and well-nourished. No distress.  HENT:  Head: Normocephalic and atraumatic.  Neck: Normal range of motion. Neck supple.  Cardiovascular: Normal rate and regular rhythm.  Exam reveals no gallop and no friction rub.   No murmur heard. Pulmonary/Chest: Effort normal and breath sounds normal. No respiratory distress. She has no wheezes.  Abdominal: Soft. Bowel sounds are normal. She exhibits no distension. There is tenderness. There is no rebound and no guarding.  There is mild tenderness to the left lower abdomen.  There  is no rebound or guarding.  Musculoskeletal: Normal range of motion.  Neurological: She is alert and oriented to person, place, and time.  Skin: Skin is warm and dry. She is not diaphoretic.  Nursing note and vitals reviewed.    ED Treatments / Results  Labs (all labs ordered are listed, but only abnormal results are displayed) Labs Reviewed  CBC WITH DIFFERENTIAL/PLATELET  COMPREHENSIVE METABOLIC PANEL  URINALYSIS, ROUTINE W REFLEX MICROSCOPIC    EKG  EKG Interpretation None       Radiology No results found.  Procedures Procedures (including critical care time)  Medications Ordered in ED Medications - No data to display   Initial Impression / Assessment and Plan / ED Course  I have reviewed the triage vital signs and the nursing notes.  Pertinent labs & imaging results that were available during my care of the patient were reviewed by me and considered in my medical decision making (see chart for details).  Patient with history of recent admission for small bowel obstruction.  She returns today with recurrence of her abdominal pain.  Her workup reveals no elevation of white count, however CT scan is suggestive of pancreatitis.  Her lipase is elevated at 102.  CT also shows what appears to be a small bowel ileus and a mildly dilated appendix with appendicitis not completely ruled out.  The majority of her tenderness is on the left and I doubt her appendix is the culprit.  Either way, I feel as though she should be admitted for treatment of her pain and further observation.  She will also undergo surgical consultation as indicated.  Dr. Olevia Bowens agrees to admit.  Final Clinical Impressions(s) / ED Diagnoses   Final diagnoses:  None    New Prescriptions New Prescriptions   No medications on file     [name], MD 11/18/16 [phone]

## 2016-11-18 ENCOUNTER — Observation Stay (HOSPITAL_COMMUNITY): Payer: Medicare Other

## 2016-11-18 ENCOUNTER — Emergency Department (HOSPITAL_COMMUNITY): Payer: Medicare Other

## 2016-11-18 ENCOUNTER — Encounter (HOSPITAL_COMMUNITY): Payer: Self-pay | Admitting: Internal Medicine

## 2016-11-18 DIAGNOSIS — K828 Other specified diseases of gallbladder: Secondary | ICD-10-CM | POA: Diagnosis not present

## 2016-11-18 DIAGNOSIS — K8020 Calculus of gallbladder without cholecystitis without obstruction: Secondary | ICD-10-CM | POA: Diagnosis not present

## 2016-11-18 DIAGNOSIS — E876 Hypokalemia: Secondary | ICD-10-CM

## 2016-11-18 DIAGNOSIS — B182 Chronic viral hepatitis C: Secondary | ICD-10-CM

## 2016-11-18 DIAGNOSIS — R109 Unspecified abdominal pain: Secondary | ICD-10-CM | POA: Diagnosis present

## 2016-11-18 DIAGNOSIS — K560 Paralytic ileus: Secondary | ICD-10-CM

## 2016-11-18 DIAGNOSIS — K5900 Constipation, unspecified: Secondary | ICD-10-CM | POA: Diagnosis not present

## 2016-11-18 DIAGNOSIS — E1165 Type 2 diabetes mellitus with hyperglycemia: Secondary | ICD-10-CM

## 2016-11-18 DIAGNOSIS — K859 Acute pancreatitis without necrosis or infection, unspecified: Secondary | ICD-10-CM

## 2016-11-18 DIAGNOSIS — K5730 Diverticulosis of large intestine without perforation or abscess without bleeding: Secondary | ICD-10-CM | POA: Diagnosis not present

## 2016-11-18 DIAGNOSIS — Z794 Long term (current) use of insulin: Secondary | ICD-10-CM

## 2016-11-18 DIAGNOSIS — I471 Supraventricular tachycardia: Secondary | ICD-10-CM

## 2016-11-18 DIAGNOSIS — N183 Chronic kidney disease, stage 3 unspecified: Secondary | ICD-10-CM

## 2016-11-18 DIAGNOSIS — K56609 Unspecified intestinal obstruction, unspecified as to partial versus complete obstruction: Secondary | ICD-10-CM | POA: Diagnosis not present

## 2016-11-18 DIAGNOSIS — K567 Ileus, unspecified: Principal | ICD-10-CM

## 2016-11-18 LAB — COMPREHENSIVE METABOLIC PANEL
ALK PHOS: 62 U/L (ref 38–126)
ALT: 29 U/L (ref 14–54)
ANION GAP: 10 (ref 5–15)
AST: 45 U/L — ABNORMAL HIGH (ref 15–41)
Albumin: 2.8 g/dL — ABNORMAL LOW (ref 3.5–5.0)
BILIRUBIN TOTAL: 0.3 mg/dL (ref 0.3–1.2)
BUN: 16 mg/dL (ref 6–20)
CALCIUM: 9.3 mg/dL (ref 8.9–10.3)
CO2: 33 mmol/L — AB (ref 22–32)
CREATININE: 1.03 mg/dL — AB (ref 0.44–1.00)
Chloride: 98 mmol/L — ABNORMAL LOW (ref 101–111)
GFR, EST NON AFRICAN AMERICAN: 56 mL/min — AB (ref >60)
Glucose, Bld: 125 mg/dL — ABNORMAL HIGH (ref 65–99)
Potassium: 3.1 mmol/L — ABNORMAL LOW (ref 3.5–5.1)
SODIUM: 141 mmol/L (ref 135–145)
TOTAL PROTEIN: 7.5 g/dL (ref 6.5–8.1)

## 2016-11-18 LAB — GLUCOSE, CAPILLARY
GLUCOSE-CAPILLARY: 118 mg/dL — AB (ref 65–99)
GLUCOSE-CAPILLARY: 116 mg/dL — AB (ref 65–99)
GLUCOSE-CAPILLARY: 94 mg/dL (ref 65–99)
Glucose-Capillary: 172 mg/dL — ABNORMAL HIGH (ref 65–99)

## 2016-11-18 LAB — BASIC METABOLIC PANEL
ANION GAP: 10 (ref 5–15)
BUN: 15 mg/dL (ref 6–20)
CALCIUM: 9.6 mg/dL (ref 8.9–10.3)
CO2: 33 mmol/L — ABNORMAL HIGH (ref 22–32)
Chloride: 94 mmol/L — ABNORMAL LOW (ref 101–111)
Creatinine, Ser: 1.18 mg/dL — ABNORMAL HIGH (ref 0.44–1.00)
GFR calc Af Amer: 55 mL/min — ABNORMAL LOW (ref >60)
GFR, EST NON AFRICAN AMERICAN: 47 mL/min — AB (ref >60)
GLUCOSE: 106 mg/dL — AB (ref 65–99)
Potassium: 3.8 mmol/L (ref 3.5–5.1)
SODIUM: 137 mmol/L (ref 135–145)

## 2016-11-18 LAB — LIPASE, BLOOD
LIPASE: 102 U/L — AB (ref 11–51)
LIPASE: 41 U/L (ref 11–51)

## 2016-11-18 LAB — RAPID URINE DRUG SCREEN, HOSP PERFORMED
Amphetamines: NOT DETECTED
BENZODIAZEPINES: POSITIVE — AB
Barbiturates: NOT DETECTED
Cocaine: POSITIVE — AB
OPIATES: POSITIVE — AB
Tetrahydrocannabinol: NOT DETECTED

## 2016-11-18 LAB — TROPONIN I
Troponin I: <0.03 ng/mL (ref <0.03)
Troponin I: <0.03 ng/mL (ref <0.03)

## 2016-11-18 LAB — MAGNESIUM
MAGNESIUM: 1.6 mg/dL — AB (ref 1.7–2.4)
MAGNESIUM: 2.4 mg/dL (ref 1.7–2.4)

## 2016-11-18 LAB — LACTIC ACID, PLASMA: LACTIC ACID, VENOUS: 1.1 mmol/L (ref 0.5–1.9)

## 2016-11-18 LAB — ETHANOL

## 2016-11-18 MED ORDER — AMIODARONE HCL 200 MG PO TABS
100.0000 mg | ORAL_TABLET | Freq: Every day | ORAL | Status: DC
  Administered 2016-11-18 – 2016-11-20 (×3): 100 mg via ORAL
  Filled 2016-11-18 (×3): qty 1

## 2016-11-18 MED ORDER — INSULIN ASPART 100 UNIT/ML ~~LOC~~ SOLN: 0.0000 [IU] | Freq: Every day | SUBCUTANEOUS | Status: DC

## 2016-11-18 MED ORDER — AMLODIPINE BESYLATE 5 MG PO TABS
10.0000 mg | ORAL_TABLET | Freq: Every day | ORAL | Status: DC
  Administered 2016-11-18 – 2016-11-20 (×3): 10 mg via ORAL
  Filled 2016-11-18 (×4): qty 2

## 2016-11-18 MED ORDER — PANTOPRAZOLE SODIUM 40 MG IV SOLR
40.0000 mg | INTRAVENOUS | Status: DC
  Administered 2016-11-19 – 2016-11-20 (×2): 40 mg via INTRAVENOUS
  Filled 2016-11-18 (×2): qty 40

## 2016-11-18 MED ORDER — MORPHINE SULFATE (PF) 4 MG/ML IV SOLN
4.0000 mg | Freq: Once | INTRAVENOUS | Status: AC
  Administered 2016-11-18: 4 mg via INTRAVENOUS
  Filled 2016-11-18: qty 1

## 2016-11-18 MED ORDER — ONDANSETRON HCL 4 MG/2ML IJ SOLN: 4.0000 mg | Freq: Four times a day (QID) | INTRAMUSCULAR | Status: DC | PRN

## 2016-11-18 MED ORDER — HYDRALAZINE HCL 20 MG/ML IJ SOLN: 10.0000 mg | INTRAMUSCULAR | Status: DC | PRN

## 2016-11-18 MED ORDER — ONDANSETRON HCL 4 MG/2ML IJ SOLN
4.0000 mg | Freq: Once | INTRAMUSCULAR | Status: AC
  Administered 2016-11-18: 4 mg via INTRAVENOUS
  Filled 2016-11-18: qty 2

## 2016-11-18 MED ORDER — PROMETHAZINE HCL 25 MG/ML IJ SOLN: 12.5000 mg | Freq: Four times a day (QID) | INTRAMUSCULAR | Status: DC | PRN

## 2016-11-18 MED ORDER — MAGNESIUM SULFATE 2 GM/50ML IV SOLN
2.0000 g | Freq: Once | INTRAVENOUS | Status: AC
  Administered 2016-11-18: 2 g via INTRAVENOUS
  Filled 2016-11-18: qty 50

## 2016-11-18 MED ORDER — MORPHINE SULFATE (PF) 2 MG/ML IV SOLN
2.0000 mg | INTRAVENOUS | Status: DC | PRN
  Administered 2016-11-18 – 2016-11-20 (×10): 2 mg via INTRAVENOUS
  Filled 2016-11-18 (×10): qty 1

## 2016-11-18 MED ORDER — HYDRALAZINE HCL 20 MG/ML IJ SOLN: 10.0000 mg | Freq: Four times a day (QID) | INTRAMUSCULAR | Status: DC | PRN

## 2016-11-18 MED ORDER — ONDANSETRON HCL 4 MG PO TABS: 4.0000 mg | ORAL_TABLET | Freq: Four times a day (QID) | ORAL | Status: DC | PRN

## 2016-11-18 MED ORDER — POTASSIUM CHLORIDE IN NACL 20-0.9 MEQ/L-% IV SOLN
INTRAVENOUS | Status: DC
  Administered 2016-11-18 – 2016-11-20 (×3): via INTRAVENOUS

## 2016-11-18 MED ORDER — POTASSIUM CHLORIDE IN NACL 20-0.45 MEQ/L-% IV SOLN
INTRAVENOUS | Status: DC
  Administered 2016-11-18: 100 mL via INTRAVENOUS
  Filled 2016-11-18 (×5): qty 1000

## 2016-11-18 MED ORDER — INSULIN ASPART 100 UNIT/ML ~~LOC~~ SOLN
0.0000 [IU] | Freq: Three times a day (TID) | SUBCUTANEOUS | Status: DC
  Administered 2016-11-19: 2 [IU] via SUBCUTANEOUS

## 2016-11-18 MED ORDER — METOPROLOL TARTRATE 5 MG/5ML IV SOLN
5.0000 mg | Freq: Four times a day (QID) | INTRAVENOUS | Status: DC
  Administered 2016-11-18 – 2016-11-19 (×4): 5 mg via INTRAVENOUS
  Filled 2016-11-18 (×4): qty 5

## 2016-11-18 NOTE — Consult Note (Signed)
Reason for Consult: Right-sided abdominal pain Referring Physician: Dr. Zettie Cooley is an 66 y.o. female.  HPI: Patient is a 66 year old black female recently discharged from the hospital with right-sided abdominal pain that had resolved.  Patient states that since she has been home, she has had recurrent episodes of right-sided abdominal pain it has not changed from her previous admission.  She presented to independent hospital with the right-sided abdominal pain which does not seem to be resolving.  Her past medical history is significant for renal cell carcinoma, status post right nephrectomy, hepatitis C, cirrhosis, and history of cocaine use.  Patient states that she did cocaine couple days ago.  She denies alcohol use.  CT scan of the abdomen does reveal both a dilated appendix to 14 mm with possible mucus present as well as a porcelain gallbladder.  Patient states her right side abdominal pain is sometimes in the right lower quadrant.  It does not seem to radiate from that point.  She denies any fever or chills.  She denies any nausea or vomiting.  Was seen 2 weeks ago by Dr. Constance Haw of general surgery.  Past Medical History:  Diagnosis Date  . Arthritis   . Asthma   . Cocaine abuse ([place])   . Essential hypertension   . GERD (gastroesophageal reflux disease)   . Hepatitis C   . Hyperlipidemia   . Lichen sclerosus et atrophicus 10/13/2014  . Neuropathic pain   . PSVT (paroxysmal supraventricular tachycardia) ([place])   . Rash   . Renal carcinoma ([place])   . Type 2 diabetes mellitus ([place])   . Vaginal irritation 10/01/2014  . Vaginal itching 10/01/2014  . Yeast infection of the vagina 10/01/2014    Past Surgical History:  Procedure Laterality Date  . ABDOMINAL HYSTERECTOMY    . COLONOSCOPY N/A 07/08/2013   RMR: Multiple colonic polyps treated [name] as described above. Inadequate prepartation comprimised examination there colonic diverticulosis. tubular and tubulovillous adenomas.  next TCS 12/2013  . COLONOSCOPY N/A 02/09/2015   RMR: Colonic polyp removed as described above. Tubular adenoma. Next colonoscopy January 2022.  . ESOPHAGOGASTRODUODENOSCOPY N/A 07/08/2013   RMR: Schatzki's ring ; small hiatal hernia-status post passage of a Maloney dilator  . MALONEY DILATION N/A 07/08/2013   Procedure: [name] DILATION;  Surgeon: [name], MD;  [facility];  Service: Endoscopy;  Laterality: N/A;  . ROBOT ASSISTED LAPAROSCOPIC NEPHRECTOMY Right 10/14/2013   Procedure: ROBOTIC ASSISTED LAPAROSCOPIC RADICAL NEPHRECTOMY;  Surgeon: [name], MD;  Location[facility];  Service: Urology;  Laterality: Right;  . SAVORY DILATION N/A 07/08/2013   Procedure: SAVORY DILATION;  Surgeon: [name], MD;  [facility];  Service: Endoscopy;  Laterality: N/A;  . TONSILLECTOMY    . Wart removal      Family History  Problem Relation Age of Onset  . Colon cancer Sister 44  . Cancer Mother   . Heart attack Father   . Hypertension Daughter   . Cancer Maternal Grandfather   . Diabetes Paternal Grandmother     Social History:  reports that she has quit smoking. Her smoking use included Cigarettes. She has a 4.00 pack-year smoking history. She has never used smokeless tobacco. She reports that she uses drugs, including Marijuana and Cocaine. She reports that she does not drink alcohol.  Allergies:  Allergies  Allergen Reactions  . Sulfa Antibiotics Itching and Other (See Comments)    burning    Medications:  Scheduled: .  amiodarone  100 mg Oral Daily  . amLODipine  10 mg Oral Daily  . insulin aspart  0-5 Units Subcutaneous QHS  . insulin aspart  0-9 Units Subcutaneous TID WC  . metoprolol tartrate  5 mg Intravenous Q6H  . pantoprazole (PROTONIX) IV  40 mg Intravenous Q24H    Results for orders placed or performed during the hospital encounter of 11/17/16 (from the past 48 hour(s))  CBC with Differential     Status: None   Collection Time: 11/17/16  11:19 PM  Result Value Ref Range   WBC 9.1 4.0 - 10.5 K/uL   RBC 4.58 3.87 - 5.11 MIL/uL   Hemoglobin 13.3 12.0 - 15.0 g/dL   HCT 40.9 36.0 - 46.0 %   MCV 89.3 78.0 - 100.0 fL   MCH 29.0 26.0 - 34.0 pg   MCHC 32.5 30.0 - 36.0 g/dL   RDW 13.2 11.5 - 15.5 %   Platelets 260 150 - 400 K/uL   Neutrophils Relative % 58 %   Neutro Abs 5.2 1.7 - 7.7 K/uL   Lymphocytes Relative 31 %   Lymphs Abs 2.8 0.7 - 4.0 K/uL   Monocytes Relative 9 %   Monocytes Absolute 0.9 0.1 - 1.0 K/uL   Eosinophils Relative 2 %   Eosinophils Absolute 0.2 0.0 - 0.7 K/uL   Basophils Relative 0 %   Basophils Absolute 0.0 0.0 - 0.1 K/uL  Comprehensive metabolic panel     Status: Abnormal   Collection Time: 11/17/16 11:19 PM  Result Value Ref Range   Sodium 141 135 - 145 mmol/L   Potassium 3.1 (L) 3.5 - 5.1 mmol/L   Chloride 98 (L) 101 - 111 mmol/L   CO2 33 (H) 22 - 32 mmol/L   Glucose, Bld 125 (H) 65 - 99 mg/dL   BUN 16 6 - 20 mg/dL   Creatinine, Ser 1.03 (H) 0.44 - 1.00 mg/dL   Calcium 9.3 8.9 - 10.3 mg/dL   Total Protein 7.5 6.5 - 8.1 g/dL   Albumin 2.8 (L) 3.5 - 5.0 g/dL   AST 45 (H) 15 - 41 U/L   ALT 29 14 - 54 U/L   Alkaline Phosphatase 62 38 - 126 U/L   Total Bilirubin 0.3 0.3 - 1.2 mg/dL   GFR calc non Af Amer 56 (L) >60 mL/min   GFR calc Af Amer >60 >60 mL/min    Comment: (NOTE) The eGFR has been calculated using the CKD EPI equation. This calculation has not been validated in all clinical situations. eGFR's persistently <60 mL/min signify possible Chronic Kidney Disease.    Anion gap 10 5 - 15  Magnesium     Status: Abnormal   Collection Time: 11/17/16 11:19 PM  Result Value Ref Range   Magnesium 1.6 (L) 1.7 - 2.4 mg/dL  Lipase, blood     Status: Abnormal   Collection Time: 11/17/16 11:20 PM  Result Value Ref Range   Lipase 102 (H) 11 - 51 U/L  Urinalysis, Routine w reflex microscopic     Status: Abnormal   Collection Time: 11/17/16 11:45 PM  Result Value Ref Range   Color, Urine STRAW  (A) YELLOW   APPearance CLEAR CLEAR   Specific Gravity, Urine 1.008 1.005 - 1.030   pH 8.0 5.0 - 8.0   Glucose, UA NEGATIVE NEGATIVE mg/dL   Hgb urine dipstick NEGATIVE NEGATIVE   Bilirubin Urine NEGATIVE NEGATIVE   Ketones, ur NEGATIVE NEGATIVE mg/dL   Protein, ur >=300 (A) NEGATIVE mg/dL  Nitrite NEGATIVE NEGATIVE   Leukocytes, UA NEGATIVE NEGATIVE   RBC / HPF 0-5 0 - 5 RBC/hpf   WBC, UA 0-5 0 - 5 WBC/hpf   Bacteria, UA RARE (A) NONE SEEN   Squamous Epithelial / LPF 0-5 (A) NONE SEEN  Glucose, capillary     Status: Abnormal   Collection Time: 11/18/16  7:23 AM  Result Value Ref Range   Glucose-Capillary 118 (H) 65 - 99 mg/dL    Ct Abdomen Wo Contrast  Result Date: 11/18/2016 CLINICAL DATA:  66 year old female with abdominal pain. Concern for gastroenteritis or colitis. EXAM: CT ABDOMEN WITHOUT CONTRAST TECHNIQUE: Multidetector CT imaging of the abdomen was performed following the standard protocol without IV contrast. COMPARISON:  Abdominal CT dated 10/29/2016 and radiograph dated 11/18/2016 mm FINDINGS: Evaluation of this exam is limited in the absence of intravenous contrast. Lower chest: There is a 4 mm nodule in the right middle lobe (series 3, image 5) similar to the CT of 04/29/2014. The visualized lung bases are otherwise clear. There is multi vessel coronary vascular calcification. No intra-abdominal free air or free fluid. Hepatobiliary: There is morphologic changes of cirrhosis. No intrahepatic biliary ductal dilatation. Small calcific densities in the liver most consistent with calcified granuloma. There is stone within the gallbladder. There is calcification of the gallbladder wall likely represents changes of porcelain gallbladder. No pericholecystic fluid or evidence of active inflammation. Pancreas: There is inflammatory changes of the head and uncinate process of the pancreas most consistent with acute pancreatitis. Correlation with pancreatic enzymes recommended. There is  no dilatation of the main pancreatic duct. No peripancreatic fluid collection, abscess, or pseudocyst. Spleen: Normal in size without focal abnormality. Adrenals/Urinary Tract: The adrenal glands are unremarkable. Postsurgical changes of right nephrectomy. The left kidney appears unremarkable. Stomach/Bowel: There is moderate amount of stool throughout the colon. There are small scattered colonic diverticula without active inflammatory changes. Top-normal caliber loop of small bowel in the left upper abdomen likely reactive ileus. The appendix is fluid-filled and mildly enlarged measuring 14 mm in diameter. No periappendiceal stranding noted. There has been interval resolution of the previously seen pericecal inflammatory changes. Mild inflammatory changes of the appendix is not entirely excluded. Clinical correlation is recommended. Vascular/Lymphatic: There is moderate atherosclerotic calcification of the abdominal aorta. No portal venous gas. Multiple mildly enlarged peripancreatic lymph nodes, likely reactive. Other: None Musculoskeletal: There is degenerative changes of the spine. L4-L5 and L5-S1 diffuse disc bulge noted. There is moderate narrowing of the central canal at L5-S1. No acute or significant osseous findings. IMPRESSION: 1. Inflammatory changes of the uncinate process of the pancreas most consistent with acute pancreatitis. Correlation with clinical exam and pancreatic enzymes recommended. 2. Mildly dilated and fluid-filled appendix measuring up to 14 mm. Interval resolution of the pericecal inflammatory changes seen on the prior CT. Correlation with clinical exam is recommended to exclude appendicitis. 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 4. Cirrhosis. 5. Colonic diverticulosis without active inflammatory changes. 6. Coronary vascular calcification. 7.  Aortic Atherosclerosis (ICD10-I70.0). Electronically Signed   By: Anner Crete M.D.   On: 11/18/2016 01:40   Dg  Abd Acute W/chest  Result Date: 11/18/2016 CLINICAL DATA:  66 year old female with abdominal pain. History of small-bowel obstruction. EXAM: DG ABDOMEN ACUTE W/ 1V CHEST COMPARISON:  Abdominal radiograph dated 11/03/2016 and CT dated 10/29/2016 FINDINGS: The lungs are clear. There is no pleural effusion or pneumothorax. The cardiac silhouette is within normal limits. There is large amount of colonic stool burden. There  is no bowel dilatation or evidence of obstruction. No free air. Radiopaque densities in the right upper quadrant may correspond to the gallstones seen on the prior CT. The osseous structures and soft tissues appear unremarkable. IMPRESSION: 1. No acute cardiopulmonary process. 2. Constipation.  No bowel obstruction. 3. Probable gallstones. Electronically Signed   By: Anner Crete M.D.   On: 11/18/2016 00:38    ROS:  Pertinent items are noted in HPI.  Blood pressure 136/70, pulse 83, temperature 98.5 F (36.9 C), temperature source Oral, resp. rate 18, height 5' (1.524 m), weight 140 lb 11.2 oz (63.8 kg), SpO2 95 %. Physical Exam: Pleasant black female in no acute distress Head is normocephalic, atraumatic Lungs are clear to auscultation with equal breath sounds bilaterally Heart examination reveals regular rate and rhythm without S3, S4, murmurs Abdomen is soft with mild discomfort along the right side of the abdomen, but no specific point tenderness is noted.  No rigidity is noted.  Bowel sounds are present.  CT scan images personally reviewed.  H&P reviewed.  Previous surgical consultation reviewed.  Assessment/Plan: Impression: Right-sided abdominal pain, probably multifactorial in nature.  Issues include a dilated appendix to 14 mm, porcelain gallbladder, cirrhosis, and history of cocaine use. Plan: No need for acute surgical intervention at this time.  I am concerned about both the porcelain gallbladder as well as the dilated appendix.  Due to the possibility of  gallbladder malignancy, any surgical intervention needs to be performed at a tertiary care center.  May start on clear liquid diet today.  Toxicology  results pending.  Aviva Signs 11/18/2016, 9:59 AM

## 2016-11-18 NOTE — Progress Notes (Signed)
PROGRESS NOTE  Cassandra Jordan ELF:[id number] DOB: 08-13-1950 DOA: 11/17/2016 PCP: Lemmie Evens, MD  Brief History:  66 year old female with a history of chronic hepatitis C, essential hypertension, renal cell carcinoma status post right nephrectomy, hyperlipidemia, diabetes mellitus type 2, and PSVT presented with 4-5-day history of worsening abdominal pain.  The patient was recently discharged from the hospital after a stay from October 29, 2016 through November 04, 2016 during which she was treated for a small bowel obstruction resulting from enteritis.  She states that she never picked up the antibiotics after discharge.  She initially endorses compliance with her medications, but as further history is obtained, it is evident that the patient has been poorly compliant with her medications.  She states that she has not taken any insulin or any of her antihypertensive medications for 2-3 days prior to this admission.  Nevertheless, the patient initially complained of LUQ and LLQ abdominal pain.  However, further history on the morning of November 18, 2016 reveals that the patient has actually had rather diffuse abdominal pain also involving her right upper quadrant and right lower quadrant.  She complains of some nausea without emesis.  She does not remember her last bowel movement, but denies any hematochezia or melena.  She denies any shortness of breath, but complains of sharp chest pain over the past 2-3 days prior to admission.  CT of the abdomen and pelvis revealed gallbladder wall calcification/porcelain gallbladder without pericholecystic fluid, inflammation at the head of the pancreas as well as the uncinate process, upper limit of normal small bowel loops in the left upper quadrant, and a fluid-filled and mildly enlarged appendix without periappendiceal stranding.  Assessment/Plan: Diffuse abdominal pain/Ileus -Exact etiology is unclear, but likely multifactorial -Consulted general  surgery--question appendicitis -Clear liquid diet -Check lactic acid -Continue IV fluids -Urine drug screen -Check alcohol level  Elevated lipase -Suspect a degree of mild pancreatitis from drug use even though lipase <3X ULN -?passed gallstone -UDS -repeat lipase, LFTs  Gallbladder wall calcification -Discussed with general surgery--Dr. Jenkins-->consulted general surgery at [facility] -discussed with [facility], Dr. Justin Mend, can be followed up as outpt  Fluid Filled Appendix -case discussed with general surgery, Drs. Arnoldo Morale and Ramirez-->do not feel patient has acute appendicitis due to normal WBC and predominant Left sided abd pain -monitor clinically  Essential hypertension/hypertensive urgency -Poor compliance with medications -Restart amlodipine -Holding metoprolol until urine drug screen returns -Hydralazine as needed SBP >180  History of cocaine abuse -Urine drug screen  Hypokalemia/hypomagnesemia -Replete  Atypical chest pain -Chest x-ray -EKG -Cycle troponins  Chronic hepatitis C with presumptive cirrhosis -Needs outpatient follow-up -10/30/16 HIV--neg -hep B surface antigen -CT abdomen and pelvis-suggests cirrhotic liver architecture  CKD stage III -Baseline creatinine 0.9-1.2 -A.m. BMP  PSVT -Restart amiodarone  Diabetes mellitus type 2 -Hemoglobin A1c -NovoLog sliding scale     Disposition Plan:   Home in 2-3 days  Family Communication:   No Family at bedside--Total time spent 40 minutes.  Greater than 50% spent face to face counseling and coordinating care. 0920 to 1000   Consultants:  General surgery--Jenkins  Code Status:  FULL   DVT Prophylaxis:  [place] Heparin   Procedures: As Listed in Progress Note Above  Antibiotics: None    Subjective: Patient is sleeping soundly as I entered the room.  Required numerous voice repetitions to wake the patient.  Once awake, the patient is alert and oriented and  conversive.  She denies any fevers, chills, shortness of breath.  She complains of abdominal pain that is generalized.  No vomiting, diarrhea, dysuria, headache, neck pain.  She complains of intermittent chest pain  Objective: Vitals:   11/18/16 0200 11/18/16 0230 11/18/16 0437 11/18/16 0600  BP: (!) 174/88 (!) 159/86 (!) 204/102 136/70  Pulse: 88 88 83   Resp:   18   Temp:   98.5 F (36.9 C)   TempSrc:   Oral   SpO2: 91% 93% 95%   Weight:   63.8 kg (140 lb 11.2 oz)   Height:        Intake/Output Summary (Last 24 hours) at 11/18/16 0916 Last data filed at 11/18/16 0600  Gross per 24 hour  Intake           171.67 ml  Output                0 ml  Net           171.67 ml   Weight change:  Exam:   General:  Pt is alert, follows commands appropriately, not in acute distress  HEENT: No icterus, No thrush, No neck mass, [place]/AT  Cardiovascular: RRR, S1/S2, no rubs, no gallops  Respiratory: Fine bibasilar crackles.  No wheezing.  Good air movement.  Abdomen: Soft/+BS, non tender, non distended, no guarding  Extremities: No edema, No lymphangitis, No petechiae, No rashes, no synovitis   Data Reviewed: I have personally reviewed following labs and imaging studies Basic Metabolic Panel:  Recent Labs Lab 11/17/16 2319  NA 141  K 3.1*  CL 98*  CO2 33*  GLUCOSE 125*  BUN 16  CREATININE 1.03*  CALCIUM 9.3  MG 1.6*   Liver Function Tests:  Recent Labs Lab 11/17/16 2319  AST 45*  ALT 29  ALKPHOS 62  BILITOT 0.3  PROT 7.5  ALBUMIN 2.8*    Recent Labs Lab 11/17/16 2320  LIPASE 102*   No results for input(s): AMMONIA in the last 168 hours. Coagulation Profile: No results for input(s): INR, PROTIME in the last 168 hours. CBC:  Recent Labs Lab 11/17/16 2319  WBC 9.1  NEUTROABS 5.2  HGB 13.3  HCT 40.9  MCV 89.3  PLT 260   Cardiac Enzymes: No results for input(s): CKTOTAL, CKMB, CKMBINDEX, TROPONINI in the last 168 hours. BNP: Invalid input(s):  POCBNP CBG:  Recent Labs Lab 11/18/16 0723  GLUCAP 118*   HbA1C: No results for input(s): HGBA1C in the last 72 hours. Urine analysis:    Component Value Date/Time   COLORURINE STRAW (A) 11/17/2016 2345   APPEARANCEUR CLEAR 11/17/2016 2345   LABSPEC 1.008 11/17/2016 2345   PHURINE 8.0 11/17/2016 2345   GLUCOSEU NEGATIVE 11/17/2016 2345   HGBUR NEGATIVE 11/17/2016 2345   BILIRUBINUR NEGATIVE 11/17/2016 2345   KETONESUR NEGATIVE 11/17/2016 2345   PROTEINUR >=300 (A) 11/17/2016 2345   UROBILINOGEN 0.2 06/30/2014 2000   NITRITE NEGATIVE 11/17/2016 2345   LEUKOCYTESUR NEGATIVE 11/17/2016 2345   Sepsis Labs: @LABRCNTIP (procalcitonin:4,lacticidven:4) )No results found for this or any previous visit (from the past 240 hour(s)).   Scheduled Meds: . metoprolol tartrate  5 mg Intravenous Q6H  . pantoprazole (PROTONIX) IV  40 mg Intravenous Q24H   Continuous Infusions: . 0.45 % NaCl with KCl 20 mEq / L 100 mL (11/18/16 0447)    Procedures/Studies: Ct Abdomen Pelvis Wo Contrast  Result Date: 10/29/2016 CLINICAL DATA:  Right-sided abdominal pain, nausea vomiting and diarrhea. Burning with urination for 3 days. History of  renal cancer, post nephrectomy. EXAM: CT ABDOMEN AND PELVIS WITHOUT CONTRAST TECHNIQUE: Multidetector CT imaging of the abdomen and pelvis was performed following the standard protocol without IV contrast. COMPARISON:  Body CT 11/29/2015 FINDINGS: Lower chest: Calcific atherosclerotic disease of the coronary arteries. Calcifications of the aortic valve annulus. Hepatobiliary: No focal liver abnormality is seen. No gallbladder wall thickening. Again noted of layering gallstones with calcifications of the gallbladder wall. No associated biliary ductal dilation. Pancreas: Unremarkable. No pancreatic ductal dilatation or surrounding inflammatory changes. Spleen: Normal in size without focal abnormality. Adrenals/Urinary Tract: Post right nephrectomy the previously demonstrated  probable left renal cyst is not seen likely due to lack of IV contrast. Adrenal glands are normal. Stomach/Bowel: Moderate dilation of small bowel loops in the mid and lower abdomen containing gas fluid levels, with relatively gradual tapering to normal caliber in the distal ileum. Maximum transverse diameter of the dilated small bowel is 3.7 cm. Asymmetric mucosal thickening of the posterior aspect of the cecum, with few scattered wall calcifications, best seen on image 31/82, sequence 2. Mucosal thickening of the distal ileum, best seen on image 48/82, sequence 2. Mesenteric stranding, most prominent in the right mid abdomen. Left colonic diverticulosis. Vascular/Lymphatic: Calcific atherosclerotic disease of the aorta. Numerous shotty mesenteric lymph nodes. Reproductive: Prostate is unremarkable. Other: Right lateral anterior abdominal wall cutaneous/subcutaneous thickening possibly represents a surgical scarring. There is a subcutaneous soft tissue nodularity within the inferior portion of this abnormality, image 69/82 sequence 2 Musculoskeletal: No acute or significant osseous findings. L5-S1 osteoarthritic changes. IMPRESSION: Post right nephrectomy. Small bowel obstruction with relatively gradual tapering to normal caliber at the level of the distal ileum. Mucosal thickening of the distal ileum and posterior aspect of the cecum, causing stenosis of the distal ileum and likely the cause of the small bowel obstruction. Associated mesenteric stranding and mesenteric lymph node enlargement. These findings may represent infectious colitis, however metastatic deposits or primary malignancy cannot be excluded. Right lateral anterior abdominal wall cutaneous/subcutaneous thickening, possibly representing a surgical scarring with subcutaneous soft tissue nodularity of its inferior portion. Subcutaneous metastatic deposits can't be excluded with this appearance. Cholelithiasis with associated gallbladder wall  calcifications. Consider surgical consultation, as gallbladder wall calcifications may be a sign of malignancy. Calcific atherosclerotic disease of the aorta. These results were called by telephone at the time of interpretation on 10/29/2016 at 10:03 pm to Dr. Noemi Chapel , who verbally acknowledged these results. Electronically Signed   By: Fidela Salisbury M.D.   On: 10/29/2016 22:03   Ct Abdomen Wo Contrast  Result Date: 11/18/2016 CLINICAL DATA:  66 year old female with abdominal pain. Concern for gastroenteritis or colitis. EXAM: CT ABDOMEN WITHOUT CONTRAST TECHNIQUE: Multidetector CT imaging of the abdomen was performed following the standard protocol without IV contrast. COMPARISON:  Abdominal CT dated 10/29/2016 and radiograph dated 11/18/2016 mm FINDINGS: Evaluation of this exam is limited in the absence of intravenous contrast. Lower chest: There is a 4 mm nodule in the right middle lobe (series 3, image 5) similar to the CT of 04/29/2014. The visualized lung bases are otherwise clear. There is multi vessel coronary vascular calcification. No intra-abdominal free air or free fluid. Hepatobiliary: There is morphologic changes of cirrhosis. No intrahepatic biliary ductal dilatation. Small calcific densities in the liver most consistent with calcified granuloma. There is stone within the gallbladder. There is calcification of the gallbladder wall likely represents changes of porcelain gallbladder. No pericholecystic fluid or evidence of active inflammation. Pancreas: There is inflammatory changes of the  head and uncinate process of the pancreas most consistent with acute pancreatitis. Correlation with pancreatic enzymes recommended. There is no dilatation of the main pancreatic duct. No peripancreatic fluid collection, abscess, or pseudocyst. Spleen: Normal in size without focal abnormality. Adrenals/Urinary Tract: The adrenal glands are unremarkable. Postsurgical changes of right nephrectomy. The  left kidney appears unremarkable. Stomach/Bowel: There is moderate amount of stool throughout the colon. There are small scattered colonic diverticula without active inflammatory changes. Top-normal caliber loop of small bowel in the left upper abdomen likely reactive ileus. The appendix is fluid-filled and mildly enlarged measuring 14 mm in diameter. No periappendiceal stranding noted. There has been interval resolution of the previously seen pericecal inflammatory changes. Mild inflammatory changes of the appendix is not entirely excluded. Clinical correlation is recommended. Vascular/Lymphatic: There is moderate atherosclerotic calcification of the abdominal aorta. No portal venous gas. Multiple mildly enlarged peripancreatic lymph nodes, likely reactive. Other: None Musculoskeletal: There is degenerative changes of the spine. L4-L5 and L5-S1 diffuse disc bulge noted. There is moderate narrowing of the central canal at L5-S1. No acute or significant osseous findings. IMPRESSION: 1. Inflammatory changes of the uncinate process of the pancreas most consistent with acute pancreatitis. Correlation with clinical exam and pancreatic enzymes recommended. 2. Mildly dilated and fluid-filled appendix measuring up to 14 mm. Interval resolution of the pericecal inflammatory changes seen on the prior CT. Correlation with clinical exam is recommended to exclude appendicitis. 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 4. Cirrhosis. 5. Colonic diverticulosis without active inflammatory changes. 6. Coronary vascular calcification. 7.  Aortic Atherosclerosis (ICD10-I70.0). Electronically Signed   By: Anner Crete M.D.   On: 11/18/2016 01:40   Dg Chest Portable 1 View  Result Date: 10/29/2016 CLINICAL DATA:  Nasogastric tube placement, history hypertension, asthma, diabetes mellitus, former smoker EXAM: PORTABLE CHEST 1 VIEW COMPARISON:  Portable exam 2440 hours compared 01/27/2016 FINDINGS: Nasogastric  tube extends into stomach. Upper normal heart size. Mediastinal contours and pulmonary vascularity normal. Mild RIGHT basilar atelectasis. Lungs otherwise clear. No pleural effusion or pneumothorax. IMPRESSION: Nasogastric tube projects over proximal stomach. RIGHT basilar atelectasis. Electronically Signed   By: Lavonia Dana M.D.   On: 10/29/2016 23:40   Dg Abd 2 Views  Result Date: 11/03/2016 CLINICAL DATA:  Small bowel obstruction EXAM: ABDOMEN - 2 VIEW COMPARISON:  10/30/2016 FINDINGS: No gas dilated small bowel loops are seen. Gas is mainly seen within nondilated colon. No visible pneumoperitoneum. The upright film is degraded by motion and soft tissue attenuation. No concerning mass effect or calcification. Artifact from EKG leads. IMPRESSION: Normal bowel gas pattern. Electronically Signed   By: Monte Fantasia M.D.   On: 11/03/2016 08:14   Dg Abd 2 Views  Result Date: 10/30/2016 CLINICAL DATA:  Lower abdominal pain. History of renal malignancy, small bowel obstruction demonstrated on CT scan yesterday. EXAM: ABDOMEN - 2 VIEW COMPARISON:  Abdominopelvic CT scan of October 29, 2016 FINDINGS: There are loops of minimally distended gas-filled small bowel in the mid and lower abdomen. There is gas and stool within normal caliber colon. No free extraluminal gas collections are observed. The esophagogastric tube tip and proximal port lie in the gastric body. No free extraluminal gas collections are observed. IMPRESSION: Decreased distention of small-bowel loops but persistent ileus or partial obstruction is present. No evidence of perforation. Electronically Signed   By: Ricka Westra  [place] M.D.   On: 10/30/2016 08:08   Dg Abd Acute W/chest  Result Date: 11/18/2016 CLINICAL DATA:  66 year old female with abdominal  pain. History of small-bowel obstruction. EXAM: DG ABDOMEN ACUTE W/ 1V CHEST COMPARISON:  Abdominal radiograph dated 11/03/2016 and CT dated 10/29/2016 FINDINGS: The lungs are clear. There is no  pleural effusion or pneumothorax. The cardiac silhouette is within normal limits. There is large amount of colonic stool burden. There is no bowel dilatation or evidence of obstruction. No free air. Radiopaque densities in the right upper quadrant may correspond to the gallstones seen on the prior CT. The osseous structures and soft tissues appear unremarkable. IMPRESSION: 1. No acute cardiopulmonary process. 2. Constipation.  No bowel obstruction. 3. Probable gallstones. Electronically Signed   By: Anner Crete M.D.   On: 11/18/2016 00:38    Rylin Saez, DO  [facility] Pager [phone]  If 7PM-7AM, please contact night-coverage [email or web address] Password [email or web address] 11/18/2016, 9:16 AM   LOS: 0 days

## 2016-11-18 NOTE — H&P (Addendum)
History and Physical    Cassandra Jordan VQX:[id number] DOB: 02-Jul-1950 DOA: 11/17/2016  PCP: Lemmie Evens, MD   Patient coming from: Home.  I have personally briefly reviewed patient's old medical records in [place]  Chief Complaint: Abdominal pain.  HPI: Cassandra Jordan is a 66 y.o. female with medical history significant of asthma, cocaine abuse, essential hypertension, GERD, chronic hepatitis C, hyperlipidemia, neuropathy, PSVT, rash, history of renal cell carcinoma, type 2 diabetes, osteoarthritis who is returning to the hospital due to recurrence of abdominal pain since yesterday.  Per patient, the pain is in her epigastrium and left upper quadrant but now radiates to RLQ associated with occasionally intense nausea, but no emesis. She denies fever, chills, diarrhea, melena or hematochezia. She denies dyspnea, chest pain, palpitations, PND, orthopnea or pitting edema of the lower extremities. She also complains of dysuria, but denies hematuria or frequency. No polyuria, no polydipsia or blurred vision.  ED Course: Initial vital signs temperature 98.39F, pulse 96, blood pressure 233/129 mmHg, respirations 20 and O2 sat 95% on room air. She received morphine 4 mg IVP and Zofran 4 mg IVP in the ED.  Her workup shows a normal CBC, urinalysis showing proteinuria and rare bacteria, but otherwise no other significant abnormality. Lipase level was 102 and AST 45 units, potassium 3.1 and bicarbonate 33 mmol/L. BUN 16, creatinine 1.03, magnesium 1.6 and glucose 125 mg/dL. Her albumin was 2.8 g/dL. All other chemistry values were unremarkable.  Imaging: Acute abdomen with chest radiograph did not show any acute pathology. CT abdomen without contrast showed inflammatory changes of the uncinate process of the pancreas. Mildly dilated and fluid-filled appendix measuring up to 14 mm. There is interval resolution of the pericecal inflammatory changes seen on previous CT. Cholelithiasis with  porcine appearance of the gallbladder. Cirrhosis. Diverticulosis of the colon, aortic atherosclerosis and coronary vascular calcification. Please see images and full radiology report for further detail.  Review of Systems: As per HPI otherwise 10 point review of systems negative.    Past Medical History:  Diagnosis Date  . Arthritis   . Asthma   . Cocaine abuse ([place])   . Essential hypertension   . GERD (gastroesophageal reflux disease)   . Hepatitis C   . Hyperlipidemia   . Lichen sclerosus et atrophicus 10/13/2014  . Neuropathic pain   . PSVT (paroxysmal supraventricular tachycardia) ([place])   . Rash   . Renal carcinoma ([place])   . Type 2 diabetes mellitus ([place])   . Vaginal irritation 10/01/2014  . Vaginal itching 10/01/2014  . Yeast infection of the vagina 10/01/2014    Past Surgical History:  Procedure Laterality Date  . ABDOMINAL HYSTERECTOMY    . COLONOSCOPY N/A 07/08/2013   RMR: Multiple colonic polyps treated [name] as described above. Inadequate prepartation comprimised examination there colonic diverticulosis. tubular and tubulovillous adenomas. next TCS 12/2013  . COLONOSCOPY N/A 02/09/2015   RMR: Colonic polyp removed as described above. Tubular adenoma. Next colonoscopy January 2022.  . ESOPHAGOGASTRODUODENOSCOPY N/A 07/08/2013   RMR: Schatzki's ring ; small hiatal hernia-status post passage of a Maloney dilator  . MALONEY DILATION N/A 07/08/2013   Procedure: [name] DILATION;  Surgeon: [name], MD;  Location: [facility];  Service: Endoscopy;  Laterality: N/A;  . ROBOT ASSISTED LAPAROSCOPIC NEPHRECTOMY Right 10/14/2013   Procedure: ROBOTIC ASSISTED LAPAROSCOPIC RADICAL NEPHRECTOMY;  Surgeon: [name], MD;  Location: [facility];  Service: Urology;  Laterality: Right;  . SAVORY DILATION N/A 07/08/2013   Procedure: SAVORY  DILATION;  Surgeon: [name], MD;  Location: [facility];  Service: Endoscopy;  Laterality: N/A;  . TONSILLECTOMY    . Wart removal        reports that she has quit smoking. Her smoking use included Cigarettes. She has a 4.00 pack-year smoking history. She has never used smokeless tobacco. She reports that she uses drugs, including Marijuana and Cocaine. She reports that she does not drink alcohol.  Allergies  Allergen Reactions  . Sulfa Antibiotics Itching and Other (See Comments)    burning    Family History  Problem Relation Age of Onset  . Colon cancer Sister 64  . Cancer Mother   . Heart attack Father   . Hypertension Daughter   . Cancer Maternal Grandfather   . Diabetes Paternal Grandmother     Prior to Admission medications   Medication Sig Start Date End Date Taking? Authorizing Provider  amiodarone (PACERONE) 100 MG tablet TAKE ONE TABLET BY MOUTH ONCE DAILY - 11/16/15  Yes Satira Sark, MD  amLODipine (NORVASC) 10 MG tablet Take 1 tablet (10 mg total) by mouth daily. 11/04/16  Yes Tat, Shanon Brow, MD  ciprofloxacin (CIPRO) 500 MG tablet Take 1 tablet (500 mg total) by mouth 2 (two) times daily. 11/04/16  Yes Tat, Shanon Brow, MD  gabapentin (NEURONTIN) 300 MG capsule Take 1 capsule (300 mg total) by mouth 2 (two) times daily. Patient taking differently: Take 600 mg by mouth 3 (three) times daily.  11/06/14  Yes Francine Graven, DO  insulin detemir (LEVEMIR) 100 UNIT/ML injection Inject 3 Units into the skin 3 (three) times daily.    Yes [provider]  Melatonin 1 MG CAPS Take 1 capsule (1 mg total) by mouth at bedtime as needed (sleep). Patient taking differently: Take 3 mg by mouth at bedtime as needed (sleep).  01/27/16  Yes Mesner, Corene Cornea, MD  metoprolol succinate (TOPROL-XL) 50 MG 24 hr tablet Take 1 tablet (50 mg total) by mouth 2 (two) times daily. Take with or immediately following a meal. 11/04/16  Yes Tat, David, MD  pantoprazole (PROTONIX) 40 MG tablet Take 1 tablet (40 mg total) by mouth daily. Take 30 minutes before breakfast 03/06/16  Yes Annitta Needs, NP  metroNIDAZOLE (FLAGYL) 500 MG tablet  Take 1 tablet (500 mg total) by mouth every 8 (eight) hours. 11/04/16   Orson Eva, MD    Physical Exam: Vitals:   11/18/16 0130 11/18/16 0200 11/18/16 0230 11/18/16 0437  BP: (!) 175/90 (!) 174/88 (!) 159/86 (!) 204/102  Pulse: 88 88 88 83  Resp:    18  Temp:    98.5 F (36.9 C)  TempSrc:    Oral  SpO2: 92% 91% 93% 95%  Weight:    63.8 kg (140 lb 11.2 oz)  Height:        Constitutional: Looks chronically ill, but currently in NAD. Eyes: PERRL, lids and conjunctivae normal ENMT: Mucous membranes are moist. Posterior pharynx clear of any exudate or lesions. Neck: normal, supple, no masses, no thyromegaly Respiratory: Decreased breath sounds on bases, otherwise clear to auscultation bilaterally, no wheezing, no crackles. Normal respiratory effort. No accessory muscle use.  Cardiovascular: Regular rate and rhythm, positive 2/6 systolic murmur, no rubs / gallops. No extremity edema. 2+ pedal pulses. No carotid bruits.  Abdomen: Soft, positive epigastric and RLQ tenderness, no guarding/rebound/masses palpated. No hepatosplenomegaly. Bowel sounds positive.  Musculoskeletal: no clubbing / cyanosis. No joint deformity upper and lower extremities. Good ROM, no contractures. Normal  muscle tone.  Skin: no rashes, lesions, ulcers. No induration Neurologic: CN 2-12 grossly intact. Sensation intact, DTR normal. Strength 5/5 in all 4.  Psychiatric: Normal judgment and insight. Alert and oriented x 3. Normal mood.    Labs on Admission: I have personally reviewed following labs and imaging studies  CBC:  Recent Labs Lab 11/17/16 2319  WBC 9.1  NEUTROABS 5.2  HGB 13.3  HCT 40.9  MCV 89.3  PLT 579   Basic Metabolic Panel:  Recent Labs Lab 11/17/16 2319  NA 141  K 3.1*  CL 98*  CO2 33*  GLUCOSE 125*  BUN 16  CREATININE 1.03*  CALCIUM 9.3  MG 1.6*   GFR: Estimated Creatinine Clearance: 45.4 mL/min (A) (by C-G formula based on SCr of 1.03 mg/dL (H)). Liver Function  Tests:  Recent Labs Lab 11/17/16 2319  AST 45*  ALT 29  ALKPHOS 62  BILITOT 0.3  PROT 7.5  ALBUMIN 2.8*    Recent Labs Lab 11/17/16 2320  LIPASE 102*   No results for input(s): AMMONIA in the last 168 hours. Coagulation Profile: No results for input(s): INR, PROTIME in the last 168 hours. Cardiac Enzymes: No results for input(s): CKTOTAL, CKMB, CKMBINDEX, TROPONINI in the last 168 hours. BNP (last 3 results) No results for input(s): PROBNP in the last 8760 hours. HbA1C: No results for input(s): HGBA1C in the last 72 hours. CBG: No results for input(s): GLUCAP in the last 168 hours. Lipid Profile: No results for input(s): CHOL, HDL, LDLCALC, TRIG, CHOLHDL, LDLDIRECT in the last 72 hours. Thyroid Function Tests: No results for input(s): TSH, T4TOTAL, FREET4, T3FREE, THYROIDAB in the last 72 hours. Anemia Panel: No results for input(s): VITAMINB12, FOLATE, FERRITIN, TIBC, IRON, RETICCTPCT in the last 72 hours. Urine analysis:    Component Value Date/Time   COLORURINE STRAW (A) 11/17/2016 2345   APPEARANCEUR CLEAR 11/17/2016 2345   LABSPEC 1.008 11/17/2016 2345   PHURINE 8.0 11/17/2016 2345   GLUCOSEU NEGATIVE 11/17/2016 2345   HGBUR NEGATIVE 11/17/2016 2345   BILIRUBINUR NEGATIVE 11/17/2016 2345   KETONESUR NEGATIVE 11/17/2016 2345   PROTEINUR >=300 (A) 11/17/2016 2345   UROBILINOGEN 0.2 06/30/2014 2000   NITRITE NEGATIVE 11/17/2016 2345   LEUKOCYTESUR NEGATIVE 11/17/2016 2345    Radiological Exams on Admission: Ct Abdomen Wo Contrast  Result Date: 11/18/2016 CLINICAL DATA:  66 year old female with abdominal pain. Concern for gastroenteritis or colitis. EXAM: CT ABDOMEN WITHOUT CONTRAST TECHNIQUE: Multidetector CT imaging of the abdomen was performed following the standard protocol without IV contrast. COMPARISON:  Abdominal CT dated 10/29/2016 and radiograph dated 11/18/2016 mm FINDINGS: Evaluation of this exam is limited in the absence of intravenous contrast.  Lower chest: There is a 4 mm nodule in the right middle lobe (series 3, image 5) similar to the CT of 04/29/2014. The visualized lung bases are otherwise clear. There is multi vessel coronary vascular calcification. No intra-abdominal free air or free fluid. Hepatobiliary: There is morphologic changes of cirrhosis. No intrahepatic biliary ductal dilatation. Small calcific densities in the liver most consistent with calcified granuloma. There is stone within the gallbladder. There is calcification of the gallbladder wall likely represents changes of porcelain gallbladder. No pericholecystic fluid or evidence of active inflammation. Pancreas: There is inflammatory changes of the head and uncinate process of the pancreas most consistent with acute pancreatitis. Correlation with pancreatic enzymes recommended. There is no dilatation of the main pancreatic duct. No peripancreatic fluid collection, abscess, or pseudocyst. Spleen: Normal in size without focal abnormality. Adrenals/Urinary Tract:  The adrenal glands are unremarkable. Postsurgical changes of right nephrectomy. The left kidney appears unremarkable. Stomach/Bowel: There is moderate amount of stool throughout the colon. There are small scattered colonic diverticula without active inflammatory changes. Top-normal caliber loop of small bowel in the left upper abdomen likely reactive ileus. The appendix is fluid-filled and mildly enlarged measuring 14 mm in diameter. No periappendiceal stranding noted. There has been interval resolution of the previously seen pericecal inflammatory changes. Mild inflammatory changes of the appendix is not entirely excluded. Clinical correlation is recommended. Vascular/Lymphatic: There is moderate atherosclerotic calcification of the abdominal aorta. No portal venous gas. Multiple mildly enlarged peripancreatic lymph nodes, likely reactive. Other: None Musculoskeletal: There is degenerative changes of the spine. L4-L5 and L5-S1  diffuse disc bulge noted. There is moderate narrowing of the central canal at L5-S1. No acute or significant osseous findings. IMPRESSION: 1. Inflammatory changes of the uncinate process of the pancreas most consistent with acute pancreatitis. Correlation with clinical exam and pancreatic enzymes recommended. 2. Mildly dilated and fluid-filled appendix measuring up to 14 mm. Interval resolution of the pericecal inflammatory changes seen on the prior CT. Correlation with clinical exam is recommended to exclude appendicitis. 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 4. Cirrhosis. 5. Colonic diverticulosis without active inflammatory changes. 6. Coronary vascular calcification. 7.  Aortic Atherosclerosis (ICD10-I70.0). Electronically Signed   By: Anner Crete M.D.   On: 11/18/2016 01:40   Dg Abd Acute W/chest  Result Date: 11/18/2016 CLINICAL DATA:  66 year old female with abdominal pain. History of small-bowel obstruction. EXAM: DG ABDOMEN ACUTE W/ 1V CHEST COMPARISON:  Abdominal radiograph dated 11/03/2016 and CT dated 10/29/2016 FINDINGS: The lungs are clear. There is no pleural effusion or pneumothorax. The cardiac silhouette is within normal limits. There is large amount of colonic stool burden. There is no bowel dilatation or evidence of obstruction. No free air. Radiopaque densities in the right upper quadrant may correspond to the gallstones seen on the prior CT. The osseous structures and soft tissues appear unremarkable. IMPRESSION: 1. No acute cardiopulmonary process. 2. Constipation.  No bowel obstruction. 3. Probable gallstones. Electronically Signed   By: Anner Crete M.D.   On: 11/18/2016 00:38   10/15/2013 2D echocardiogram with contrast.  ------------------------------------------------------------------- LV EF: 65% -  70%  ------------------------------------------------------------------- Indications:   Abnormal EKG  794.31.  ------------------------------------------------------------------- History:  Risk factors: Hypertension. Diabetes mellitus.  ------------------------------------------------------------------- Study Conclusions  - Left ventricle: The cavity size was normal. There was moderate concentric hypertrophy. Systolic function was vigorous. The estimated ejection fraction was in the range of 65% to 70%. There was dynamic obstruction at restin the mid cavity, with a peak velocity of 3 cm/sec and a peak gradient of 36 mm Hg. Wall motion was normal; there were no regional wall motion abnormalities. Doppler parameters are consistent with abnormal left ventricular relaxation (grade 1 diastolic dysfunction).  EKG: Independently reviewed.   Assessment/Plan Principal Problem:   Abdominal pain Observation/telemetry. Keep nothing by mouth. Continue IV fluids. Continue analgesics as needed. Continue antiemetics as needed. Protonix 40 mg IV PB every 24 hours. Follow-up CBC, CMP and lipase. Consult general surgery later today.  Active Problems:   Cholelithiasis Discussed again with the patient. Advised that this most likely will need surgical intervention at some point.    Hypokalemia Replacing. Follow-up potassium level.    Hypomagnesemia Replacing with magnesium sulfate 2 g IVPB    Hepatitis C, chronic (HCC) Monitor LFTs. Should follow up with hepatologist as an outpatient if desired, as previously  suggested.    Essential hypertension Metoprolol 5 mg IVP every 6 hours. Hydralazine 10 mg every 4 hours as needed for SBP over 160 mmHg. Monitor blood pressure and heart rate.    PSVT (paroxysmal supraventricular tachycardia) (HCC) Metoprolol 5 mg IVP every 6 hours. Resume oral metoprolol once cleared for oral intake. Continue telemetry while using IV metoprolol. Optimize electrolytes    Diabetes mellitus, type II ([place]) Currently nothing by mouth. CBG  monitoring every 6 hours until diet is resumed. Once tolerating diet, CBG monitoring with RI SS before meals.    GERD (gastroesophageal reflux disease) Protonix 40 mg IVP every 24 hours.     DVT prophylaxis: SCDs. Code Status: Full code. Family Communication:  Disposition Plan: Admit for symptoms treatment, bowel rest and further evaluation. Consults called: Routine general surgery consult. Admission status: Observation/telemetry.   Reubin Milan MD [facility] Pager [phone].  If 7PM-7AM, please contact night-coverage [email or web address] Password [email or web address]  11/18/2016, 6:21 AM

## 2016-11-19 ENCOUNTER — Observation Stay (HOSPITAL_COMMUNITY): Payer: Medicare Other

## 2016-11-19 DIAGNOSIS — Z794 Long term (current) use of insulin: Secondary | ICD-10-CM | POA: Diagnosis not present

## 2016-11-19 DIAGNOSIS — K567 Ileus, unspecified: Secondary | ICD-10-CM | POA: Diagnosis not present

## 2016-11-19 DIAGNOSIS — I10 Essential (primary) hypertension: Secondary | ICD-10-CM

## 2016-11-19 DIAGNOSIS — I471 Supraventricular tachycardia: Secondary | ICD-10-CM | POA: Diagnosis not present

## 2016-11-19 DIAGNOSIS — F1410 Cocaine abuse, uncomplicated: Secondary | ICD-10-CM

## 2016-11-19 DIAGNOSIS — K828 Other specified diseases of gallbladder: Secondary | ICD-10-CM | POA: Diagnosis not present

## 2016-11-19 DIAGNOSIS — N183 Chronic kidney disease, stage 3 (moderate): Secondary | ICD-10-CM | POA: Diagnosis not present

## 2016-11-19 DIAGNOSIS — R072 Precordial pain: Secondary | ICD-10-CM

## 2016-11-19 DIAGNOSIS — R7989 Other specified abnormal findings of blood chemistry: Secondary | ICD-10-CM | POA: Diagnosis not present

## 2016-11-19 DIAGNOSIS — B182 Chronic viral hepatitis C: Secondary | ICD-10-CM | POA: Diagnosis not present

## 2016-11-19 DIAGNOSIS — R109 Unspecified abdominal pain: Secondary | ICD-10-CM | POA: Diagnosis not present

## 2016-11-19 DIAGNOSIS — E1165 Type 2 diabetes mellitus with hyperglycemia: Secondary | ICD-10-CM | POA: Diagnosis not present

## 2016-11-19 DIAGNOSIS — K859 Acute pancreatitis without necrosis or infection, unspecified: Secondary | ICD-10-CM | POA: Diagnosis not present

## 2016-11-19 DIAGNOSIS — E876 Hypokalemia: Secondary | ICD-10-CM | POA: Diagnosis not present

## 2016-11-19 DIAGNOSIS — K8020 Calculus of gallbladder without cholecystitis without obstruction: Secondary | ICD-10-CM | POA: Diagnosis not present

## 2016-11-19 LAB — CBC
HCT: 41.8 % (ref 36.0–46.0)
Hemoglobin: 12.9 g/dL (ref 12.0–15.0)
MCH: 28.1 pg (ref 26.0–34.0)
MCHC: 30.9 g/dL (ref 30.0–36.0)
MCV: 91.1 fL (ref 78.0–100.0)
PLATELETS: 213 10*3/uL (ref 150–400)
RBC: 4.59 MIL/uL (ref 3.87–5.11)
RDW: 13.4 % (ref 11.5–15.5)
WBC: 8.4 10*3/uL (ref 4.0–10.5)

## 2016-11-19 LAB — HEMOGLOBIN A1C
Hgb A1c MFr Bld: 7 % — ABNORMAL HIGH (ref 4.8–5.6)
Mean Plasma Glucose: 154 mg/dL

## 2016-11-19 LAB — COMPREHENSIVE METABOLIC PANEL
ALBUMIN: 2.5 g/dL — AB (ref 3.5–5.0)
ALT: 24 U/L (ref 14–54)
ANION GAP: 7 (ref 5–15)
AST: 37 U/L (ref 15–41)
Alkaline Phosphatase: 45 U/L (ref 38–126)
BILIRUBIN TOTAL: 0.6 mg/dL (ref 0.3–1.2)
BUN: 12 mg/dL (ref 6–20)
CALCIUM: 8.6 mg/dL — AB (ref 8.9–10.3)
CO2: 30 mmol/L (ref 22–32)
Chloride: 99 mmol/L — ABNORMAL LOW (ref 101–111)
Creatinine, Ser: 1.03 mg/dL — ABNORMAL HIGH (ref 0.44–1.00)
GFR calc Af Amer: >60 mL/min (ref >60)
GFR calc non Af Amer: 56 mL/min — ABNORMAL LOW (ref >60)
GLUCOSE: 97 mg/dL (ref 65–99)
Potassium: 4.1 mmol/L (ref 3.5–5.1)
Sodium: 136 mmol/L (ref 135–145)
TOTAL PROTEIN: 6.6 g/dL (ref 6.5–8.1)

## 2016-11-19 LAB — GLUCOSE, CAPILLARY
GLUCOSE-CAPILLARY: 110 mg/dL — AB (ref 65–99)
GLUCOSE-CAPILLARY: 171 mg/dL — AB (ref 65–99)
GLUCOSE-CAPILLARY: 73 mg/dL (ref 65–99)
Glucose-Capillary: 106 mg/dL — ABNORMAL HIGH (ref 65–99)
Glucose-Capillary: 143 mg/dL — ABNORMAL HIGH (ref 65–99)

## 2016-11-19 LAB — D-DIMER, QUANTITATIVE: D-Dimer, Quant: 0.73 ug/mL-FEU — ABNORMAL HIGH (ref 0.00–0.50)

## 2016-11-19 LAB — HEPATITIS B SURFACE ANTIGEN: HEP B S AG: NEGATIVE

## 2016-11-19 MED ORDER — ZOLPIDEM TARTRATE 5 MG PO TABS
5.0000 mg | ORAL_TABLET | Freq: Once | ORAL | Status: AC
  Administered 2016-11-19: 5 mg via ORAL
  Filled 2016-11-19: qty 1

## 2016-11-19 MED ORDER — IOPAMIDOL (ISOVUE-370) INJECTION 76%
100.0000 mL | Freq: Once | INTRAVENOUS | Status: AC | PRN
  Administered 2016-11-19: 100 mL via INTRAVENOUS

## 2016-11-19 MED ORDER — CARVEDILOL 3.125 MG PO TABS
3.1250 mg | ORAL_TABLET | Freq: Two times a day (BID) | ORAL | Status: DC
  Administered 2016-11-19 – 2016-11-20 (×2): 3.125 mg via ORAL
  Filled 2016-11-19 (×2): qty 1

## 2016-11-19 MED ORDER — CLOTRIMAZOLE 1 % VA CREA
1.0000 | TOPICAL_CREAM | Freq: Every day | VAGINAL | Status: DC
  Administered 2016-11-19: 1 via VAGINAL
  Filled 2016-11-19 (×2): qty 45

## 2016-11-19 NOTE — Care Management Obs Status (Signed)
[place] NOTIFICATION   Patient Details  Name: Cassandra Jordan MRN: [id number] Date of Birth: 05/07/1950   Medicare Observation Status Notification Given:  Yes    Jacquelyn Shadrick, Chauncey [place], RN 11/19/2016, 10:42 AM

## 2016-11-19 NOTE — Discharge Summary (Signed)
Physician Discharge Summary  Cassandra Jordan:[id number] DOB: 02/11/1950 DOA: 11/17/2016  PCP: Lemmie Evens, MD  Admit date: 11/17/2016 Discharge date: 11/20/2016  Admitted From: Home Disposition:  Home   Recommendations for Outpatient Follow-up:  1. Follow up with PCP in 1-2 weeks 2. Please obtain BMP/CBC in one week     Discharge Condition: Stable CODE STATUS: FULL Diet recommendation: Heart Healthy / Carb Modified   Brief/Interim Summary: 66 year old female with a history of chronic hepatitis C, essential hypertension, renal cell carcinoma status post right nephrectomy, hyperlipidemia, diabetes mellitus type 2, and PSVT presented with 4-5-day history of worsening abdominal pain. The patient was recently discharged from the hospital after a stay from October 29, 2016 through November 04, 2016 during which she was treated for a small bowel obstruction resulting from enteritis. She states that she never picked up the antibiotics after discharge. She initially endorses compliance with her medications, but as further history is obtained, it is evident that the patient has been poorly compliant with her medications. She states that she has not taken any insulin or any of her antihypertensive medications for 2-3 days prior to this admission. Nevertheless, the patient initially complained of LUQ and LLQabdominal pain. However, further history on the morning of November 18, 2016 reveals that the patient has actually had rather diffuse abdominal pain also involving her right upper quadrant and right lower quadrant. She complains of some nausea without emesis. She does not remember her last bowel movement, but denies any hematochezia or melena. She denies any shortness of breath, but complains of sharp chest pain over the past 2-3 days prior to admission. CT of the abdomen and pelvis revealed gallbladder wall calcification/porcelain gallbladder without pericholecystic fluid,  inflammation at the head of the pancreas as well as the uncinate process, upper limit of normal small bowel loops in the left upper quadrant, and a fluid-filled and mildly enlarged appendix without periappendiceal stranding.  The patient was observed clinically off antibiotics.  She improved, and her diet was advanced gradually which she tolerated.  Regarding her gallbladder wall calcifications, Dr. Arnoldo Morale spoke with [name] surgery, Dr. Barry Dienes, who agreed to see the patient in consultation in the outpatient setting.   Discharge Diagnoses:  Diffuse abdominal pain/Ileus -Exact etiology is unclear, but likely multifactorial -Consultedgeneral surgery--doubts acute appendicitis -Abdominal pain improved -Clearliquid diet>>>advance to full liquids>>>soft diet which pt tolerated -Check lactic acid--1.1 -Continued IV fluids -Urine drug screen--positive cocaine -Check alcohol level--neg  Elevated lipase -Suspect a degree of mild pancreatitis from drug use even though lipase <3X ULN -UDS--positive cocaine -repeat lipase--41 -diet advance and pt tolerated  Cocaine abuse -likely primary etiology of abd pain-->vasoconstriction-->transient ischemia -cessation discussed -pt states she uses once per week   Gallbladder wall calcification -Discussed with general surgery--Dr. Jenkins-->consulted general surgery at [facility] -discussed with [facility], Dr. Justin Mend, can be followed up as outpt -Dr. Arnoldo Morale has discussed with Dr. [name] who will see patient in consult as outpt  Fluid Filled Appendix -case discussed with general surgery, Drs. Arnoldo Morale and Ramirez-->do not feel patient has acute appendicitis due to normal WBC and predominant Left sided abd pain -monitor clinically--improved clinically without abx  Essential hypertension/hypertensive urgency -Poor compliance with medications -Restart amlodipine -d/c metoprolol, start coreg as it also has  alpha blockade -Hydralazine as needed SBP >180  Hypokalemia/hypomagnesemia -Repleted  Atypical chest pain -Personally reviewed chest x-ray--no infiltrate or effusions -Cycle troponins--negative -D-dimer--0.73 -CTA chest negative PE, bibasilar atelectasis, right middle lobe nodule  Chronic hepatitis  C with presumptive cirrhosis-without hepatic coma -Needs outpatient follow-up -10/30/16 HIV--neg -hep B surface antigen--pending -CT abdomen and pelvis-suggests cirrhotic liver architecture  CKD stage III -Baseline creatinine 0.9-1.2 -A.m. BMP  PSVT -Restart amiodarone -Currently in sinus rhythm  Right Lung nodule -incidental finding -pt made aware -out pt surveillance CT  Diabetes mellitus type 2 -Hemoglobin A1c--7.0 -NovoLog sliding scale    Discharge Instructions  Discharge Instructions    Diet - low sodium heart healthy    Complete by:  As directed    Increase activity slowly    Complete by:  As directed      Allergies as of 11/20/2016      Reactions   Sulfa Antibiotics Itching, Other (See Comments)   burning      Medication List    STOP taking these medications   metoprolol succinate 50 MG 24 hr tablet Commonly known as:  TOPROL-XL   metroNIDAZOLE 500 MG tablet Commonly known as:  FLAGYL     TAKE these medications   amiodarone 100 MG tablet Commonly known as:  PACERONE TAKE ONE TABLET BY MOUTH ONCE DAILY -   amLODipine 10 MG tablet Commonly known as:  NORVASC Take 1 tablet (10 mg total) by mouth daily.   carvedilol 3.125 MG tablet Commonly known as:  COREG Take 1 tablet (3.125 mg total) by mouth 2 (two) times daily with a meal.   gabapentin 300 MG capsule Commonly known as:  NEURONTIN Take 1 capsule (300 mg total) by mouth 2 (two) times daily. What changed:  how much to take  when to take this   insulin detemir 100 UNIT/ML injection Commonly known as:  LEVEMIR Inject 3 Units into the skin 3 (three) times daily.     pantoprazole 40 MG tablet Commonly known as:  PROTONIX Take 1 tablet (40 mg total) by mouth daily. Take 30 minutes before breakfast      Follow-up Information    [name], MD Follow up in 2 week(s).   Specialty:  General Surgery Contact information: [address] [place] [address] [phone]          Allergies  Allergen Reactions  . Sulfa Antibiotics Itching and Other (See Comments)    burning    Consultations:  General surgery   Procedures/Studies: Ct Abdomen Pelvis Wo Contrast  Result Date: 10/29/2016 CLINICAL DATA:  Right-sided abdominal pain, nausea vomiting and diarrhea. Burning with urination for 3 days. History of renal cancer, post nephrectomy. EXAM: CT ABDOMEN AND PELVIS WITHOUT CONTRAST TECHNIQUE: Multidetector CT imaging of the abdomen and pelvis was performed following the standard protocol without IV contrast. COMPARISON:  Body CT 11/29/2015 FINDINGS: Lower chest: Calcific atherosclerotic disease of the coronary arteries. Calcifications of the aortic valve annulus. Hepatobiliary: No focal liver abnormality is seen. No gallbladder wall thickening. Again noted of layering gallstones with calcifications of the gallbladder wall. No associated biliary ductal dilation. Pancreas: Unremarkable. No pancreatic ductal dilatation or surrounding inflammatory changes. Spleen: Normal in size without focal abnormality. Adrenals/Urinary Tract: Post right nephrectomy the previously demonstrated probable left renal cyst is not seen likely due to lack of IV contrast. Adrenal glands are normal. Stomach/Bowel: Moderate dilation of small bowel loops in the mid and lower abdomen containing gas fluid levels, with relatively gradual tapering to normal caliber in the distal ileum. Maximum transverse diameter of the dilated small bowel is 3.7 cm. Asymmetric mucosal thickening of the posterior aspect of the cecum, with few scattered wall calcifications, best seen on image  31/82, sequence 2. Mucosal thickening of the distal ileum, best seen on image 48/82, sequence 2. Mesenteric stranding, most prominent in the right mid abdomen. Left colonic diverticulosis. Vascular/Lymphatic: Calcific atherosclerotic disease of the aorta. Numerous shotty mesenteric lymph nodes. Reproductive: Prostate is unremarkable. Other: Right lateral anterior abdominal wall cutaneous/subcutaneous thickening possibly represents a surgical scarring. There is a subcutaneous soft tissue nodularity within the inferior portion of this abnormality, image 69/82 sequence 2 Musculoskeletal: No acute or significant osseous findings. L5-S1 osteoarthritic changes. IMPRESSION: Post right nephrectomy. Small bowel obstruction with relatively gradual tapering to normal caliber at the level of the distal ileum. Mucosal thickening of the distal ileum and posterior aspect of the cecum, causing stenosis of the distal ileum and likely the cause of the small bowel obstruction. Associated mesenteric stranding and mesenteric lymph node enlargement. These findings may represent infectious colitis, however metastatic deposits or primary malignancy cannot be excluded. Right lateral anterior abdominal wall cutaneous/subcutaneous thickening, possibly representing a surgical scarring with subcutaneous soft tissue nodularity of its inferior portion. Subcutaneous metastatic deposits can't be excluded with this appearance. Cholelithiasis with associated gallbladder wall calcifications. Consider surgical consultation, as gallbladder wall calcifications may be a sign of malignancy. Calcific atherosclerotic disease of the aorta. These results were called by telephone at the time of interpretation on 10/29/2016 at 10:03 pm to Dr. Noemi Chapel , who verbally acknowledged these results. Electronically Signed   By: Fidela Salisbury M.D.   On: 10/29/2016 22:03   Ct Abdomen Wo Contrast  Result Date: 11/18/2016 CLINICAL DATA:  66 year old female  with abdominal pain. Concern for gastroenteritis or colitis. EXAM: CT ABDOMEN WITHOUT CONTRAST TECHNIQUE: Multidetector CT imaging of the abdomen was performed following the standard protocol without IV contrast. COMPARISON:  Abdominal CT dated 10/29/2016 and radiograph dated 11/18/2016 mm FINDINGS: Evaluation of this exam is limited in the absence of intravenous contrast. Lower chest: There is a 4 mm nodule in the right middle lobe (series 3, image 5) similar to the CT of 04/29/2014. The visualized lung bases are otherwise clear. There is multi vessel coronary vascular calcification. No intra-abdominal free air or free fluid. Hepatobiliary: There is morphologic changes of cirrhosis. No intrahepatic biliary ductal dilatation. Small calcific densities in the liver most consistent with calcified granuloma. There is stone within the gallbladder. There is calcification of the gallbladder wall likely represents changes of porcelain gallbladder. No pericholecystic fluid or evidence of active inflammation. Pancreas: There is inflammatory changes of the head and uncinate process of the pancreas most consistent with acute pancreatitis. Correlation with pancreatic enzymes recommended. There is no dilatation of the main pancreatic duct. No peripancreatic fluid collection, abscess, or pseudocyst. Spleen: Normal in size without focal abnormality. Adrenals/Urinary Tract: The adrenal glands are unremarkable. Postsurgical changes of right nephrectomy. The left kidney appears unremarkable. Stomach/Bowel: There is moderate amount of stool throughout the colon. There are small scattered colonic diverticula without active inflammatory changes. Top-normal caliber loop of small bowel in the left upper abdomen likely reactive ileus. The appendix is fluid-filled and mildly enlarged measuring 14 mm in diameter. No periappendiceal stranding noted. There has been interval resolution of the previously seen pericecal inflammatory changes. Mild  inflammatory changes of the appendix is not entirely excluded. Clinical correlation is recommended. Vascular/Lymphatic: There is moderate atherosclerotic calcification of the abdominal aorta. No portal venous gas. Multiple mildly enlarged peripancreatic lymph nodes, likely reactive. Other: None Musculoskeletal: There is degenerative changes of the spine. L4-L5 and L5-S1 diffuse disc bulge noted. There is moderate narrowing of the  central canal at L5-S1. No acute or significant osseous findings. IMPRESSION: 1. Inflammatory changes of the uncinate process of the pancreas most consistent with acute pancreatitis. Correlation with clinical exam and pancreatic enzymes recommended. 2. Mildly dilated and fluid-filled appendix measuring up to 14 mm. Interval resolution of the pericecal inflammatory changes seen on the prior CT. Correlation with clinical exam is recommended to exclude appendicitis. 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 4. Cirrhosis. 5. Colonic diverticulosis without active inflammatory changes. 6. Coronary vascular calcification. 7.  Aortic Atherosclerosis (ICD10-I70.0). Electronically Signed   By: Anner Crete M.D.   On: 11/18/2016 01:40   Ct Angio Chest Pe W Or Wo Contrast  Result Date: 11/19/2016 CLINICAL DATA:  Elevated D-dimer.  Chest pain short of breath. EXAM: CT ANGIOGRAPHY CHEST WITH CONTRAST TECHNIQUE: Multidetector CT imaging of the chest was performed using the standard protocol during bolus administration of intravenous contrast. Multiplanar CT image reconstructions and MIPs were obtained to evaluate the vascular anatomy. CONTRAST:  100 mL Isovue COMPARISON:  None. FINDINGS: Cardiovascular: No filling defects within the pulmonary arteries to suggest acute pulmonary embolism. No acute findings of the aorta or great vessels. No pericardial fluid. Mediastinum/Nodes: No axillary supraclavicular adenopathy. No mediastinal hilar adenopathy. No pericardial fluid.  Esophagus normal. Lungs/Pleura: Bibasilar atelectasis small bowel. Focal infiltrate. No pulmonary edema. No pulmonary infarction Small nodule in the RIGHT middle lobe measures 6 mm (image 77, series 7). Upper Abdomen: Elevation RIGHT hemidiaphragm. Musculoskeletal: No aggressive osseous lesion. Review of the MIP images confirms the above findings. IMPRESSION: 1. No acute pulmonary embolism. 2. Mild basilar atelectasis. 3. Small RIGHT middle lobe nodule. No follow-up needed if patient is low-risk. Non-contrast chest CT can be considered in 12 months if patient is high-risk. This recommendation follows the consensus statement: Guidelines for Management of Incidental Pulmonary Nodules Detected on CT Images: From the Fleischner Society 2017; Radiology 2017; 284:228-243. Electronically Signed   By: Suzy Bouchard M.D.   On: 11/19/2016 20:28   Dg Chest Port 1 View  Result Date: 11/18/2016 CLINICAL DATA:  T4-5 day history of worsening abdominal pain. Recent hospitalization for small bowel obstruction. EXAM: PORTABLE CHEST 1 VIEW COMPARISON:  Radiographs 10/29/2016 and 11/18/2016. FINDINGS: 1548 hours. The heart size and mediastinal contours are normal. The lungs are clear. There is no pleural effusion or pneumothorax. No acute osseous findings are identified. An apparent button overlaps the tracheal air column. Telemetry leads overlie the chest. IMPRESSION: No active cardiopulmonary process. Electronically Signed   By: Richardean Sale M.D.   On: 11/18/2016 17:42   Dg Chest Portable 1 View  Result Date: 10/29/2016 CLINICAL DATA:  Nasogastric tube placement, history hypertension, asthma, diabetes mellitus, former smoker EXAM: PORTABLE CHEST 1 VIEW COMPARISON:  Portable exam 1031 hours compared 01/27/2016 FINDINGS: Nasogastric tube extends into stomach. Upper normal heart size. Mediastinal contours and pulmonary vascularity normal. Mild RIGHT basilar atelectasis. Lungs otherwise clear. No pleural effusion or  pneumothorax. IMPRESSION: Nasogastric tube projects over proximal stomach. RIGHT basilar atelectasis. Electronically Signed   By: Lavonia Dana M.D.   On: 10/29/2016 23:40   Dg Abd 2 Views  Result Date: 11/03/2016 CLINICAL DATA:  Small bowel obstruction EXAM: ABDOMEN - 2 VIEW COMPARISON:  10/30/2016 FINDINGS: No gas dilated small bowel loops are seen. Gas is mainly seen within nondilated colon. No visible pneumoperitoneum. The upright film is degraded by motion and soft tissue attenuation. No concerning mass effect or calcification. Artifact from EKG leads. IMPRESSION: Normal bowel gas pattern. Electronically Signed  By: Monte Fantasia M.D.   On: 11/03/2016 08:14   Dg Abd 2 Views  Result Date: 10/30/2016 CLINICAL DATA:  Lower abdominal pain. History of renal malignancy, small bowel obstruction demonstrated on CT scan yesterday. EXAM: ABDOMEN - 2 VIEW COMPARISON:  Abdominopelvic CT scan of October 29, 2016 FINDINGS: There are loops of minimally distended gas-filled small bowel in the mid and lower abdomen. There is gas and stool within normal caliber colon. No free extraluminal gas collections are observed. The esophagogastric tube tip and proximal port lie in the gastric body. No free extraluminal gas collections are observed. IMPRESSION: Decreased distention of small-bowel loops but persistent ileus or partial obstruction is present. No evidence of perforation. Electronically Signed   By: Erinn Mendosa  [place] M.D.   On: 10/30/2016 08:08   Dg Abd Acute W/chest  Result Date: 11/18/2016 CLINICAL DATA:  66 year old female with abdominal pain. History of small-bowel obstruction. EXAM: DG ABDOMEN ACUTE W/ 1V CHEST COMPARISON:  Abdominal radiograph dated 11/03/2016 and CT dated 10/29/2016 FINDINGS: The lungs are clear. There is no pleural effusion or pneumothorax. The cardiac silhouette is within normal limits. There is large amount of colonic stool burden. There is no bowel dilatation or evidence of obstruction. No  free air. Radiopaque densities in the right upper quadrant may correspond to the gallstones seen on the prior CT. The osseous structures and soft tissues appear unremarkable. IMPRESSION: 1. No acute cardiopulmonary process. 2. Constipation.  No bowel obstruction. 3. Probable gallstones. Electronically Signed   By: Anner Crete M.D.   On: 11/18/2016 00:38        Discharge Exam: Vitals:   11/19/16 2212 11/20/16 0617  BP: (!) 143/70 (!) 147/71  Pulse: 65 66  Resp: 15 18  Temp: 97.8 F (36.6 C) 98.3 F (36.8 C)  SpO2: 96% 95%   Vitals:   11/18/16 2051 11/19/16 0515 11/19/16 2212 11/20/16 0617  BP: (!) 171/89 (!) 147/69 (!) 143/70 (!) 147/71  Pulse: 71 62 65 66  Resp: 16 16 15 18   Temp: 98.3 F (36.8 C) 97.8 F (36.6 C) 97.8 F (36.6 C) 98.3 F (36.8 C)  TempSrc: Oral Oral Oral Oral  SpO2: 90% 91% 96% 95%  Weight:      Height:        General: Pt is alert, awake, not in acute distress Cardiovascular: RRR, S1/S2 +, no rubs, no gallops Respiratory: CTA bilaterally, no wheezing, no rhonchi Abdominal: Soft, NT, ND, bowel sounds + Extremities: no edema, no cyanosis   The results of significant diagnostics from this hospitalization (including imaging, microbiology, ancillary and laboratory) are listed below for reference.    Significant Diagnostic Studies: Ct Abdomen Pelvis Wo Contrast  Result Date: 10/29/2016 CLINICAL DATA:  Right-sided abdominal pain, nausea vomiting and diarrhea. Burning with urination for 3 days. History of renal cancer, post nephrectomy. EXAM: CT ABDOMEN AND PELVIS WITHOUT CONTRAST TECHNIQUE: Multidetector CT imaging of the abdomen and pelvis was performed following the standard protocol without IV contrast. COMPARISON:  Body CT 11/29/2015 FINDINGS: Lower chest: Calcific atherosclerotic disease of the coronary arteries. Calcifications of the aortic valve annulus. Hepatobiliary: No focal liver abnormality is seen. No gallbladder wall thickening. Again noted  of layering gallstones with calcifications of the gallbladder wall. No associated biliary ductal dilation. Pancreas: Unremarkable. No pancreatic ductal dilatation or surrounding inflammatory changes. Spleen: Normal in size without focal abnormality. Adrenals/Urinary Tract: Post right nephrectomy the previously demonstrated probable left renal cyst is not seen likely due to lack of IV contrast. Adrenal  glands are normal. Stomach/Bowel: Moderate dilation of small bowel loops in the mid and lower abdomen containing gas fluid levels, with relatively gradual tapering to normal caliber in the distal ileum. Maximum transverse diameter of the dilated small bowel is 3.7 cm. Asymmetric mucosal thickening of the posterior aspect of the cecum, with few scattered wall calcifications, best seen on image 31/82, sequence 2. Mucosal thickening of the distal ileum, best seen on image 48/82, sequence 2. Mesenteric stranding, most prominent in the right mid abdomen. Left colonic diverticulosis. Vascular/Lymphatic: Calcific atherosclerotic disease of the aorta. Numerous shotty mesenteric lymph nodes. Reproductive: Prostate is unremarkable. Other: Right lateral anterior abdominal wall cutaneous/subcutaneous thickening possibly represents a surgical scarring. There is a subcutaneous soft tissue nodularity within the inferior portion of this abnormality, image 69/82 sequence 2 Musculoskeletal: No acute or significant osseous findings. L5-S1 osteoarthritic changes. IMPRESSION: Post right nephrectomy. Small bowel obstruction with relatively gradual tapering to normal caliber at the level of the distal ileum. Mucosal thickening of the distal ileum and posterior aspect of the cecum, causing stenosis of the distal ileum and likely the cause of the small bowel obstruction. Associated mesenteric stranding and mesenteric lymph node enlargement. These findings may represent infectious colitis, however metastatic deposits or primary malignancy  cannot be excluded. Right lateral anterior abdominal wall cutaneous/subcutaneous thickening, possibly representing a surgical scarring with subcutaneous soft tissue nodularity of its inferior portion. Subcutaneous metastatic deposits can't be excluded with this appearance. Cholelithiasis with associated gallbladder wall calcifications. Consider surgical consultation, as gallbladder wall calcifications may be a sign of malignancy. Calcific atherosclerotic disease of the aorta. These results were called by telephone at the time of interpretation on 10/29/2016 at 10:03 pm to Dr. Noemi Chapel , who verbally acknowledged these results. Electronically Signed   By: Fidela Salisbury M.D.   On: 10/29/2016 22:03   Ct Abdomen Wo Contrast  Result Date: 11/18/2016 CLINICAL DATA:  66 year old female with abdominal pain. Concern for gastroenteritis or colitis. EXAM: CT ABDOMEN WITHOUT CONTRAST TECHNIQUE: Multidetector CT imaging of the abdomen was performed following the standard protocol without IV contrast. COMPARISON:  Abdominal CT dated 10/29/2016 and radiograph dated 11/18/2016 mm FINDINGS: Evaluation of this exam is limited in the absence of intravenous contrast. Lower chest: There is a 4 mm nodule in the right middle lobe (series 3, image 5) similar to the CT of 04/29/2014. The visualized lung bases are otherwise clear. There is multi vessel coronary vascular calcification. No intra-abdominal free air or free fluid. Hepatobiliary: There is morphologic changes of cirrhosis. No intrahepatic biliary ductal dilatation. Small calcific densities in the liver most consistent with calcified granuloma. There is stone within the gallbladder. There is calcification of the gallbladder wall likely represents changes of porcelain gallbladder. No pericholecystic fluid or evidence of active inflammation. Pancreas: There is inflammatory changes of the head and uncinate process of the pancreas most consistent with acute pancreatitis.  Correlation with pancreatic enzymes recommended. There is no dilatation of the main pancreatic duct. No peripancreatic fluid collection, abscess, or pseudocyst. Spleen: Normal in size without focal abnormality. Adrenals/Urinary Tract: The adrenal glands are unremarkable. Postsurgical changes of right nephrectomy. The left kidney appears unremarkable. Stomach/Bowel: There is moderate amount of stool throughout the colon. There are small scattered colonic diverticula without active inflammatory changes. Top-normal caliber loop of small bowel in the left upper abdomen likely reactive ileus. The appendix is fluid-filled and mildly enlarged measuring 14 mm in diameter. No periappendiceal stranding noted. There has been interval resolution of the previously seen pericecal  inflammatory changes. Mild inflammatory changes of the appendix is not entirely excluded. Clinical correlation is recommended. Vascular/Lymphatic: There is moderate atherosclerotic calcification of the abdominal aorta. No portal venous gas. Multiple mildly enlarged peripancreatic lymph nodes, likely reactive. Other: None Musculoskeletal: There is degenerative changes of the spine. L4-L5 and L5-S1 diffuse disc bulge noted. There is moderate narrowing of the central canal at L5-S1. No acute or significant osseous findings. IMPRESSION: 1. Inflammatory changes of the uncinate process of the pancreas most consistent with acute pancreatitis. Correlation with clinical exam and pancreatic enzymes recommended. 2. Mildly dilated and fluid-filled appendix measuring up to 14 mm. Interval resolution of the pericecal inflammatory changes seen on the prior CT. Correlation with clinical exam is recommended to exclude appendicitis. 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 4. Cirrhosis. 5. Colonic diverticulosis without active inflammatory changes. 6. Coronary vascular calcification. 7.  Aortic Atherosclerosis (ICD10-I70.0). Electronically Signed    By: Anner Crete M.D.   On: 11/18/2016 01:40   Ct Angio Chest Pe W Or Wo Contrast  Result Date: 11/19/2016 CLINICAL DATA:  Elevated D-dimer.  Chest pain short of breath. EXAM: CT ANGIOGRAPHY CHEST WITH CONTRAST TECHNIQUE: Multidetector CT imaging of the chest was performed using the standard protocol during bolus administration of intravenous contrast. Multiplanar CT image reconstructions and MIPs were obtained to evaluate the vascular anatomy. CONTRAST:  100 mL Isovue COMPARISON:  None. FINDINGS: Cardiovascular: No filling defects within the pulmonary arteries to suggest acute pulmonary embolism. No acute findings of the aorta or great vessels. No pericardial fluid. Mediastinum/Nodes: No axillary supraclavicular adenopathy. No mediastinal hilar adenopathy. No pericardial fluid. Esophagus normal. Lungs/Pleura: Bibasilar atelectasis small bowel. Focal infiltrate. No pulmonary edema. No pulmonary infarction Small nodule in the RIGHT middle lobe measures 6 mm (image 77, series 7). Upper Abdomen: Elevation RIGHT hemidiaphragm. Musculoskeletal: No aggressive osseous lesion. Review of the MIP images confirms the above findings. IMPRESSION: 1. No acute pulmonary embolism. 2. Mild basilar atelectasis. 3. Small RIGHT middle lobe nodule. No follow-up needed if patient is low-risk. Non-contrast chest CT can be considered in 12 months if patient is high-risk. This recommendation follows the consensus statement: Guidelines for Management of Incidental Pulmonary Nodules Detected on CT Images: From the Fleischner Society 2017; Radiology 2017; 284:228-243. Electronically Signed   By: Suzy Bouchard M.D.   On: 11/19/2016 20:28   Dg Chest Port 1 View  Result Date: 11/18/2016 CLINICAL DATA:  T4-5 day history of worsening abdominal pain. Recent hospitalization for small bowel obstruction. EXAM: PORTABLE CHEST 1 VIEW COMPARISON:  Radiographs 10/29/2016 and 11/18/2016. FINDINGS: 1548 hours. The heart size and mediastinal  contours are normal. The lungs are clear. There is no pleural effusion or pneumothorax. No acute osseous findings are identified. An apparent button overlaps the tracheal air column. Telemetry leads overlie the chest. IMPRESSION: No active cardiopulmonary process. Electronically Signed   By: Richardean Sale M.D.   On: 11/18/2016 17:42   Dg Chest Portable 1 View  Result Date: 10/29/2016 CLINICAL DATA:  Nasogastric tube placement, history hypertension, asthma, diabetes mellitus, former smoker EXAM: PORTABLE CHEST 1 VIEW COMPARISON:  Portable exam 0347 hours compared 01/27/2016 FINDINGS: Nasogastric tube extends into stomach. Upper normal heart size. Mediastinal contours and pulmonary vascularity normal. Mild RIGHT basilar atelectasis. Lungs otherwise clear. No pleural effusion or pneumothorax. IMPRESSION: Nasogastric tube projects over proximal stomach. RIGHT basilar atelectasis. Electronically Signed   By: Lavonia Dana M.D.   On: 10/29/2016 23:40   Dg Abd 2 Views  Result Date: 11/03/2016 CLINICAL DATA:  Small bowel obstruction EXAM: ABDOMEN - 2 VIEW COMPARISON:  10/30/2016 FINDINGS: No gas dilated small bowel loops are seen. Gas is mainly seen within nondilated colon. No visible pneumoperitoneum. The upright film is degraded by motion and soft tissue attenuation. No concerning mass effect or calcification. Artifact from EKG leads. IMPRESSION: Normal bowel gas pattern. Electronically Signed   By: Monte Fantasia M.D.   On: 11/03/2016 08:14   Dg Abd 2 Views  Result Date: 10/30/2016 CLINICAL DATA:  Lower abdominal pain. History of renal malignancy, small bowel obstruction demonstrated on CT scan yesterday. EXAM: ABDOMEN - 2 VIEW COMPARISON:  Abdominopelvic CT scan of October 29, 2016 FINDINGS: There are loops of minimally distended gas-filled small bowel in the mid and lower abdomen. There is gas and stool within normal caliber colon. No free extraluminal gas collections are observed. The esophagogastric tube  tip and proximal port lie in the gastric body. No free extraluminal gas collections are observed. IMPRESSION: Decreased distention of small-bowel loops but persistent ileus or partial obstruction is present. No evidence of perforation. Electronically Signed   By: Abeni Finchum  [place] M.D.   On: 10/30/2016 08:08   Dg Abd Acute W/chest  Result Date: 11/18/2016 CLINICAL DATA:  66 year old female with abdominal pain. History of small-bowel obstruction. EXAM: DG ABDOMEN ACUTE W/ 1V CHEST COMPARISON:  Abdominal radiograph dated 11/03/2016 and CT dated 10/29/2016 FINDINGS: The lungs are clear. There is no pleural effusion or pneumothorax. The cardiac silhouette is within normal limits. There is large amount of colonic stool burden. There is no bowel dilatation or evidence of obstruction. No free air. Radiopaque densities in the right upper quadrant may correspond to the gallstones seen on the prior CT. The osseous structures and soft tissues appear unremarkable. IMPRESSION: 1. No acute cardiopulmonary process. 2. Constipation.  No bowel obstruction. 3. Probable gallstones. Electronically Signed   By: Anner Crete M.D.   On: 11/18/2016 00:38     Microbiology: No results found for this or any previous visit (from the past 240 hour(s)).   Labs: Basic Metabolic Panel:  Recent Labs Lab 11/17/16 2319 11/18/16 1112 11/19/16 0435 11/20/16 0436  NA 141 137 136 139  K 3.1* 3.8 4.1 4.2  CL 98* 94* 99* 102  CO2 33* 33* 30 31  GLUCOSE 125* 106* 97 89  BUN 16 15 12 11   CREATININE 1.03* 1.18* 1.03* 1.05*  CALCIUM 9.3 9.6 8.6* 8.7*  MG 1.6* 2.4  --   --    Liver Function Tests:  Recent Labs Lab 11/17/16 2319 11/19/16 0435  AST 45* 37  ALT 29 24  ALKPHOS 62 45  BILITOT 0.3 0.6  PROT 7.5 6.6  ALBUMIN 2.8* 2.5*    Recent Labs Lab 11/17/16 2320 11/18/16 1112  LIPASE 102* 41   No results for input(s): AMMONIA in the last 168 hours. CBC:  Recent Labs Lab 11/17/16 2319 11/19/16 0435  WBC 9.1  8.4  NEUTROABS 5.2  --   HGB 13.3 12.9  HCT 40.9 41.8  MCV 89.3 91.1  PLT 260 213   Cardiac Enzymes:  Recent Labs Lab 11/18/16 1112 11/18/16 1531  TROPONINI <0.03 <0.03   BNP: Invalid input(s): POCBNP CBG:  Recent Labs Lab 11/19/16 0753 11/19/16 1102 11/19/16 1635 11/19/16 2159 11/20/16 0754  GLUCAP 110* 171* 73 143* 89    Time coordinating discharge:  Greater than 30 minutes  Signed:  Erminie Foulks, DO [facility] Pager: [phone] 11/20/2016, 10:10 AM

## 2016-11-19 NOTE — Progress Notes (Signed)
Subjective: Patient states her abdominal pain is less.  She was sleeping in the bed when I entered the room.  No nausea or vomiting have been noted.  Objective: Vital signs in last 24 hours: Temp:  [97.8 F (36.6 C)-98.5 F (36.9 C)] 97.8 F (36.6 C) (10/29 0515) Pulse Rate:  [62-76] 62 (10/29 0515) Resp:  [16] 16 (10/29 0515) BP: (138-171)/(69-89) 147/69 (10/29 0515) SpO2:  [90 %-92 %] 91 % (10/29 0515) Last BM Date: 11/17/16  Intake/Output from previous day: 10/28 0701 - 10/29 0700 In: 2393.8 [P.O.:240; I.V.:2153.8] Out: 450 [Urine:450] Intake/Output this shift: No intake/output data recorded.  General appearance: alert, cooperative and no distress GI: Soft with minimal discomfort to palpation in the right upper quadrant.  No distention is noted.  No rigidity is noted.  Lab Results:   Recent Labs  11/17/16 2319 11/19/16 0435  WBC 9.1 8.4  HGB 13.3 12.9  HCT 40.9 41.8  PLT 260 213   BMET  Recent Labs  11/18/16 1112 11/19/16 0435  NA 137 136  K 3.8 4.1  CL 94* 99*  CO2 33* 30  GLUCOSE 106* 97  BUN 15 12  CREATININE 1.18* 1.03*  CALCIUM 9.6 8.6*   PT/INR No results for input(s): LABPROT, INR in the last 72 hours.  Studies/Results: Ct Abdomen Wo Contrast  Result Date: 11/18/2016 CLINICAL DATA:  66 year old female with abdominal pain. Concern for gastroenteritis or colitis. EXAM: CT ABDOMEN WITHOUT CONTRAST TECHNIQUE: Multidetector CT imaging of the abdomen was performed following the standard protocol without IV contrast. COMPARISON:  Abdominal CT dated 10/29/2016 and radiograph dated 11/18/2016 mm FINDINGS: Evaluation of this exam is limited in the absence of intravenous contrast. Lower chest: There is a 4 mm nodule in the right middle lobe (series 3, image 5) similar to the CT of 04/29/2014. The visualized lung bases are otherwise clear. There is multi vessel coronary vascular calcification. No intra-abdominal free air or free fluid. Hepatobiliary: There  is morphologic changes of cirrhosis. No intrahepatic biliary ductal dilatation. Small calcific densities in the liver most consistent with calcified granuloma. There is stone within the gallbladder. There is calcification of the gallbladder wall likely represents changes of porcelain gallbladder. No pericholecystic fluid or evidence of active inflammation. Pancreas: There is inflammatory changes of the head and uncinate process of the pancreas most consistent with acute pancreatitis. Correlation with pancreatic enzymes recommended. There is no dilatation of the main pancreatic duct. No peripancreatic fluid collection, abscess, or pseudocyst. Spleen: Normal in size without focal abnormality. Adrenals/Urinary Tract: The adrenal glands are unremarkable. Postsurgical changes of right nephrectomy. The left kidney appears unremarkable. Stomach/Bowel: There is moderate amount of stool throughout the colon. There are small scattered colonic diverticula without active inflammatory changes. Top-normal caliber loop of small bowel in the left upper abdomen likely reactive ileus. The appendix is fluid-filled and mildly enlarged measuring 14 mm in diameter. No periappendiceal stranding noted. There has been interval resolution of the previously seen pericecal inflammatory changes. Mild inflammatory changes of the appendix is not entirely excluded. Clinical correlation is recommended. Vascular/Lymphatic: There is moderate atherosclerotic calcification of the abdominal aorta. No portal venous gas. Multiple mildly enlarged peripancreatic lymph nodes, likely reactive. Other: None Musculoskeletal: There is degenerative changes of the spine. L4-L5 and L5-S1 diffuse disc bulge noted. There is moderate narrowing of the central canal at L5-S1. No acute or significant osseous findings. IMPRESSION: 1. Inflammatory changes of the uncinate process of the pancreas most consistent with acute pancreatitis. Correlation with clinical exam and  pancreatic enzymes recommended. 2. Mildly dilated and fluid-filled appendix measuring up to 14 mm. Interval resolution of the pericecal inflammatory changes seen on the prior CT. Correlation with clinical exam is recommended to exclude appendicitis. 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 4. Cirrhosis. 5. Colonic diverticulosis without active inflammatory changes. 6. Coronary vascular calcification. 7.  Aortic Atherosclerosis (ICD10-I70.0). Electronically Signed   By: Anner Crete M.D.   On: 11/18/2016 01:40   Dg Chest Port 1 View  Result Date: 11/18/2016 CLINICAL DATA:  T4-5 day history of worsening abdominal pain. Recent hospitalization for small bowel obstruction. EXAM: PORTABLE CHEST 1 VIEW COMPARISON:  Radiographs 10/29/2016 and 11/18/2016. FINDINGS: 1548 hours. The heart size and mediastinal contours are normal. The lungs are clear. There is no pleural effusion or pneumothorax. No acute osseous findings are identified. An apparent button overlaps the tracheal air column. Telemetry leads overlie the chest. IMPRESSION: No active cardiopulmonary process. Electronically Signed   By: Richardean Sale M.D.   On: 11/18/2016 17:42   Dg Abd Acute W/chest  Result Date: 11/18/2016 CLINICAL DATA:  66 year old female with abdominal pain. History of small-bowel obstruction. EXAM: DG ABDOMEN ACUTE W/ 1V CHEST COMPARISON:  Abdominal radiograph dated 11/03/2016 and CT dated 10/29/2016 FINDINGS: The lungs are clear. There is no pleural effusion or pneumothorax. The cardiac silhouette is within normal limits. There is large amount of colonic stool burden. There is no bowel dilatation or evidence of obstruction. No free air. Radiopaque densities in the right upper quadrant may correspond to the gallstones seen on the prior CT. The osseous structures and soft tissues appear unremarkable. IMPRESSION: 1. No acute cardiopulmonary process. 2. Constipation.  No bowel obstruction. 3. Probable  gallstones. Electronically Signed   By: Anner Crete M.D.   On: 11/18/2016 00:38    Anti-infectives: Anti-infectives    None      Assessment/Plan: Impression: Abdominal pain, resolving.  Porcelain gallbladder with dilated appendix.  No need for acute surgical intervention at this time. Plan: Advance diet as tolerated.  LOS: 0 days    [facility] Signs 11/19/2016

## 2016-11-19 NOTE — Progress Notes (Signed)
Pt down to radiology for CT. Pt left via wheelchair, stable and no s/s of distress. IV saline locked. Telemetry in place.

## 2016-11-19 NOTE — Progress Notes (Signed)
PROGRESS NOTE  Cassandra Jordan LKT:[id number] DOB: 1950/12/11 DOA: 11/17/2016 PCP: Lemmie Evens, MD  Brief History:  66 year old female with a history of chronic hepatitis C, essential hypertension, renal cell carcinoma status post right nephrectomy, hyperlipidemia, diabetes mellitus type 2, and PSVT presented with 4-5-day history of worsening abdominal pain.  The patient was recently discharged from the hospital after a stay from October 29, 2016 through November 04, 2016 during which she was treated for a small bowel obstruction resulting from enteritis.  She states that she never picked up the antibiotics after discharge.  She initially endorses compliance with her medications, but as further history is obtained, it is evident that the patient has been poorly compliant with her medications.  She states that she has not taken any insulin or any of her antihypertensive medications for 2-3 days prior to this admission.  Nevertheless, the patient initially complained of LUQ and LLQ abdominal pain.  However, further history on the morning of November 18, 2016 reveals that the patient has actually had rather diffuse abdominal pain also involving her right upper quadrant and right lower quadrant.  She complains of some nausea without emesis.  She does not remember her last bowel movement, but denies any hematochezia or melena.  She denies any shortness of breath, but complains of sharp chest pain over the past 2-3 days prior to admission.  CT of the abdomen and pelvis revealed gallbladder wall calcification/porcelain gallbladder without pericholecystic fluid, inflammation at the head of the pancreas as well as the uncinate process, upper limit of normal small bowel loops in the left upper quadrant, and a fluid-filled and mildly enlarged appendix without periappendiceal stranding.  Assessment/Plan: Diffuse abdominal pain/Ileus -Exact etiology is unclear, but likely multifactorial -Consulted  general surgery--doubts acute appendicitis -Abdominal pain improving -Clear liquid diet>>>advance to full liquids -Check lactic acid--1.1 -Continue IV fluids -Urine drug screen--positive cocaine -Check alcohol level--neg  Elevated lipase -Suspect a degree of mild pancreatitis from drug use even though lipase <3X ULN -?passed gallstone -UDS--positive cocaine -repeat lipase--41  Cocaine abuse -likely primary etiology of abd pain-->vasoconstriction-->transient ischemia -cessation discussed -pt states she uses once per week   Gallbladder wall calcification -Discussed with general surgery--Dr. Jenkins-->consulted general surgery at [facility] -discussed with [facility], Dr. Justin Mend, can be followed up as outpt  Fluid Filled Appendix -case discussed with general surgery, Drs. Arnoldo Morale and Ramirez-->do not feel patient has acute appendicitis due to normal WBC and predominant Left sided abd pain -monitor clinically  Essential hypertension/hypertensive urgency -Poor compliance with medications -Restart amlodipine -d/c metoprolol, start coreg as it also has alpha blockade -Hydralazine as needed SBP >180  Hypokalemia/hypomagnesemia -Replete  Atypical chest pain -Personally reviewed chest x-ray--no infiltrate or effusions -Cycle troponins--negative -D-dimer  Chronic hepatitis C with presumptive cirrhosis-without hepatic coma -Needs outpatient follow-up -10/30/16 HIV--neg -hep B surface antigen--pending -CT abdomen and pelvis-suggests cirrhotic liver architecture  CKD stage III -Baseline creatinine 0.9-1.2 -A.m. BMP  PSVT -Restart amiodarone -Currently in sinus rhythm  Diabetes mellitus type 2 -Hemoglobin A1c--7.0 -NovoLog sliding scale     Disposition Plan:   Home in 1- 2 days  Family Communication:   No Family at bedside   Consultants:  General surgery--Jenkins  Code Status:  FULL   DVT Prophylaxis:  [place]  Heparin   Procedures: As Listed in Progress Note Above  Antibiotics:  None    Subjective: Patient states that abdominal pain is improving.  She wants to eat.  Denies any fevers, chills,  shortness breath,.  Objective: Vitals:   11/18/16 1200 11/18/16 1500 11/18/16 2051 11/19/16 0515  BP: (!) 171/86 138/73 (!) 171/89 (!) 147/69  Pulse: 76 70 71 62  Resp:  16 16 16   Temp:  98.5 F (36.9 C) 98.3 F (36.8 C) 97.8 F (36.6 C)  TempSrc:  Oral Oral Oral  SpO2:  92% 90% 91%  Weight:      Height:        Intake/Output Summary (Last 24 hours) at 11/19/16 1157 Last data filed at 11/19/16 1009  Gross per 24 hour  Intake           3007.5 ml  Output              450 ml  Net           2557.5 ml   Weight change:  Exam:   General:  Pt is alert, follows commands appropriately, not in acute distress  HEENT: No icterus, No thrush, No neck mass, [place]/AT  Cardiovascular: RRR, S1/S2, no rubs, no gallops  Respiratory: Bibasilar crackles.  No wheezing.  Abdomen: Soft/+BS, non tender, non distended, no guarding  Extremities: No edema, No lymphangitis, No petechiae, No rashes, no synovitis   Data Reviewed: I have personally reviewed following labs and imaging studies Basic Metabolic Panel:  Recent Labs Lab 11/17/16 2319 11/18/16 1112 11/19/16 0435  NA 141 137 136  K 3.1* 3.8 4.1  CL 98* 94* 99*  CO2 33* 33* 30  GLUCOSE 125* 106* 97  BUN 16 15 12   CREATININE 1.03* 1.18* 1.03*  CALCIUM 9.3 9.6 8.6*  MG 1.6* 2.4  --    Liver Function Tests:  Recent Labs Lab 11/17/16 2319 11/19/16 0435  AST 45* 37  ALT 29 24  ALKPHOS 62 45  BILITOT 0.3 0.6  PROT 7.5 6.6  ALBUMIN 2.8* 2.5*    Recent Labs Lab 11/17/16 2320 11/18/16 1112  LIPASE 102* 41   No results for input(s): AMMONIA in the last 168 hours. Coagulation Profile: No results for input(s): INR, PROTIME in the last 168 hours. CBC:  Recent Labs Lab 11/17/16 2319 11/19/16 0435  WBC 9.1 8.4  NEUTROABS  5.2  --   HGB 13.3 12.9  HCT 40.9 41.8  MCV 89.3 91.1  PLT 260 213   Cardiac Enzymes:  Recent Labs Lab 11/18/16 1112 11/18/16 1531  TROPONINI <0.03 <0.03   BNP: Invalid input(s): POCBNP CBG:  Recent Labs Lab 11/18/16 1630 11/18/16 2236 11/19/16 0513 11/19/16 0753 11/19/16 1102  GLUCAP 94 172* 106* 110* 171*   HbA1C:  Recent Labs  11/18/16 1112  HGBA1C 7.0*   Urine analysis:    Component Value Date/Time   COLORURINE STRAW (A) 11/17/2016 2345   APPEARANCEUR CLEAR 11/17/2016 2345   LABSPEC 1.008 11/17/2016 2345   PHURINE 8.0 11/17/2016 2345   GLUCOSEU NEGATIVE 11/17/2016 2345   HGBUR NEGATIVE 11/17/2016 2345   BILIRUBINUR NEGATIVE 11/17/2016 2345   KETONESUR NEGATIVE 11/17/2016 2345   PROTEINUR >=300 (A) 11/17/2016 2345   UROBILINOGEN 0.2 06/30/2014 2000   NITRITE NEGATIVE 11/17/2016 2345   LEUKOCYTESUR NEGATIVE 11/17/2016 2345   Sepsis Labs: @LABRCNTIP (procalcitonin:4,lacticidven:4) )No results found for this or any previous visit (from the past 240 hour(s)).   Scheduled Meds: . amiodarone  100 mg Oral Daily  . amLODipine  10 mg Oral Daily  . insulin aspart  0-5 Units Subcutaneous QHS  . insulin aspart  0-9 Units Subcutaneous TID WC  . metoprolol tartrate  5 mg Intravenous Q6H  . pantoprazole (PROTONIX) IV  40 mg Intravenous Q24H   Continuous Infusions: . 0.9 % NaCl with KCl 20 mEq / L Stopped (11/19/16 1101)    Procedures/Studies: Ct Abdomen Pelvis Wo Contrast  Result Date: 10/29/2016 CLINICAL DATA:  Right-sided abdominal pain, nausea vomiting and diarrhea. Burning with urination for 3 days. History of renal cancer, post nephrectomy. EXAM: CT ABDOMEN AND PELVIS WITHOUT CONTRAST TECHNIQUE: Multidetector CT imaging of the abdomen and pelvis was performed following the standard protocol without IV contrast. COMPARISON:  Body CT 11/29/2015 FINDINGS: Lower chest: Calcific atherosclerotic disease of the coronary arteries. Calcifications of the aortic  valve annulus. Hepatobiliary: No focal liver abnormality is seen. No gallbladder wall thickening. Again noted of layering gallstones with calcifications of the gallbladder wall. No associated biliary ductal dilation. Pancreas: Unremarkable. No pancreatic ductal dilatation or surrounding inflammatory changes. Spleen: Normal in size without focal abnormality. Adrenals/Urinary Tract: Post right nephrectomy the previously demonstrated probable left renal cyst is not seen likely due to lack of IV contrast. Adrenal glands are normal. Stomach/Bowel: Moderate dilation of small bowel loops in the mid and lower abdomen containing gas fluid levels, with relatively gradual tapering to normal caliber in the distal ileum. Maximum transverse diameter of the dilated small bowel is 3.7 cm. Asymmetric mucosal thickening of the posterior aspect of the cecum, with few scattered wall calcifications, best seen on image 31/82, sequence 2. Mucosal thickening of the distal ileum, best seen on image 48/82, sequence 2. Mesenteric stranding, most prominent in the right mid abdomen. Left colonic diverticulosis. Vascular/Lymphatic: Calcific atherosclerotic disease of the aorta. Numerous shotty mesenteric lymph nodes. Reproductive: Prostate is unremarkable. Other: Right lateral anterior abdominal wall cutaneous/subcutaneous thickening possibly represents a surgical scarring. There is a subcutaneous soft tissue nodularity within the inferior portion of this abnormality, image 69/82 sequence 2 Musculoskeletal: No acute or significant osseous findings. L5-S1 osteoarthritic changes. IMPRESSION: Post right nephrectomy. Small bowel obstruction with relatively gradual tapering to normal caliber at the level of the distal ileum. Mucosal thickening of the distal ileum and posterior aspect of the cecum, causing stenosis of the distal ileum and likely the cause of the small bowel obstruction. Associated mesenteric stranding and mesenteric lymph node  enlargement. These findings may represent infectious colitis, however metastatic deposits or primary malignancy cannot be excluded. Right lateral anterior abdominal wall cutaneous/subcutaneous thickening, possibly representing a surgical scarring with subcutaneous soft tissue nodularity of its inferior portion. Subcutaneous metastatic deposits can't be excluded with this appearance. Cholelithiasis with associated gallbladder wall calcifications. Consider surgical consultation, as gallbladder wall calcifications may be a sign of malignancy. Calcific atherosclerotic disease of the aorta. These results were called by telephone at the time of interpretation on 10/29/2016 at 10:03 pm to Dr. Noemi Chapel , who verbally acknowledged these results. Electronically Signed   By: Fidela Salisbury M.D.   On: 10/29/2016 22:03   Ct Abdomen Wo Contrast  Result Date: 11/18/2016 CLINICAL DATA:  66 year old female with abdominal pain. Concern for gastroenteritis or colitis. EXAM: CT ABDOMEN WITHOUT CONTRAST TECHNIQUE: Multidetector CT imaging of the abdomen was performed following the standard protocol without IV contrast. COMPARISON:  Abdominal CT dated 10/29/2016 and radiograph dated 11/18/2016 mm FINDINGS: Evaluation of this exam is limited in the absence of intravenous contrast. Lower chest: There is a 4 mm nodule in the right middle lobe (series 3, image 5) similar to the CT of 04/29/2014. The visualized lung bases are otherwise clear. There is multi vessel coronary vascular calcification. No intra-abdominal  free air or free fluid. Hepatobiliary: There is morphologic changes of cirrhosis. No intrahepatic biliary ductal dilatation. Small calcific densities in the liver most consistent with calcified granuloma. There is stone within the gallbladder. There is calcification of the gallbladder wall likely represents changes of porcelain gallbladder. No pericholecystic fluid or evidence of active inflammation. Pancreas: There is  inflammatory changes of the head and uncinate process of the pancreas most consistent with acute pancreatitis. Correlation with pancreatic enzymes recommended. There is no dilatation of the main pancreatic duct. No peripancreatic fluid collection, abscess, or pseudocyst. Spleen: Normal in size without focal abnormality. Adrenals/Urinary Tract: The adrenal glands are unremarkable. Postsurgical changes of right nephrectomy. The left kidney appears unremarkable. Stomach/Bowel: There is moderate amount of stool throughout the colon. There are small scattered colonic diverticula without active inflammatory changes. Top-normal caliber loop of small bowel in the left upper abdomen likely reactive ileus. The appendix is fluid-filled and mildly enlarged measuring 14 mm in diameter. No periappendiceal stranding noted. There has been interval resolution of the previously seen pericecal inflammatory changes. Mild inflammatory changes of the appendix is not entirely excluded. Clinical correlation is recommended. Vascular/Lymphatic: There is moderate atherosclerotic calcification of the abdominal aorta. No portal venous gas. Multiple mildly enlarged peripancreatic lymph nodes, likely reactive. Other: None Musculoskeletal: There is degenerative changes of the spine. L4-L5 and L5-S1 diffuse disc bulge noted. There is moderate narrowing of the central canal at L5-S1. No acute or significant osseous findings. IMPRESSION: 1. Inflammatory changes of the uncinate process of the pancreas most consistent with acute pancreatitis. Correlation with clinical exam and pancreatic enzymes recommended. 2. Mildly dilated and fluid-filled appendix measuring up to 14 mm. Interval resolution of the pericecal inflammatory changes seen on the prior CT. Correlation with clinical exam is recommended to exclude appendicitis. 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 4. Cirrhosis. 5. Colonic diverticulosis without active inflammatory  changes. 6. Coronary vascular calcification. 7.  Aortic Atherosclerosis (ICD10-I70.0). Electronically Signed   By: Anner Crete M.D.   On: 11/18/2016 01:40   Dg Chest Port 1 View  Result Date: 11/18/2016 CLINICAL DATA:  T4-5 day history of worsening abdominal pain. Recent hospitalization for small bowel obstruction. EXAM: PORTABLE CHEST 1 VIEW COMPARISON:  Radiographs 10/29/2016 and 11/18/2016. FINDINGS: 1548 hours. The heart size and mediastinal contours are normal. The lungs are clear. There is no pleural effusion or pneumothorax. No acute osseous findings are identified. An apparent button overlaps the tracheal air column. Telemetry leads overlie the chest. IMPRESSION: No active cardiopulmonary process. Electronically Signed   By: Richardean Sale M.D.   On: 11/18/2016 17:42   Dg Chest Portable 1 View  Result Date: 10/29/2016 CLINICAL DATA:  Nasogastric tube placement, history hypertension, asthma, diabetes mellitus, former smoker EXAM: PORTABLE CHEST 1 VIEW COMPARISON:  Portable exam 2423 hours compared 01/27/2016 FINDINGS: Nasogastric tube extends into stomach. Upper normal heart size. Mediastinal contours and pulmonary vascularity normal. Mild RIGHT basilar atelectasis. Lungs otherwise clear. No pleural effusion or pneumothorax. IMPRESSION: Nasogastric tube projects over proximal stomach. RIGHT basilar atelectasis. Electronically Signed   By: Lavonia Dana M.D.   On: 10/29/2016 23:40   Dg Abd 2 Views  Result Date: 11/03/2016 CLINICAL DATA:  Small bowel obstruction EXAM: ABDOMEN - 2 VIEW COMPARISON:  10/30/2016 FINDINGS: No gas dilated small bowel loops are seen. Gas is mainly seen within nondilated colon. No visible pneumoperitoneum. The upright film is degraded by motion and soft tissue attenuation. No concerning mass effect or calcification. Artifact from EKG leads. IMPRESSION:  Normal bowel gas pattern. Electronically Signed   By: Monte Fantasia M.D.   On: 11/03/2016 08:14   Dg Abd 2  Views  Result Date: 10/30/2016 CLINICAL DATA:  Lower abdominal pain. History of renal malignancy, small bowel obstruction demonstrated on CT scan yesterday. EXAM: ABDOMEN - 2 VIEW COMPARISON:  Abdominopelvic CT scan of October 29, 2016 FINDINGS: There are loops of minimally distended gas-filled small bowel in the mid and lower abdomen. There is gas and stool within normal caliber colon. No free extraluminal gas collections are observed. The esophagogastric tube tip and proximal port lie in the gastric body. No free extraluminal gas collections are observed. IMPRESSION: Decreased distention of small-bowel loops but persistent ileus or partial obstruction is present. No evidence of perforation. Electronically Signed   By: Dajuana Palen  [place] M.D.   On: 10/30/2016 08:08   Dg Abd Acute W/chest  Result Date: 11/18/2016 CLINICAL DATA:  66 year old female with abdominal pain. History of small-bowel obstruction. EXAM: DG ABDOMEN ACUTE W/ 1V CHEST COMPARISON:  Abdominal radiograph dated 11/03/2016 and CT dated 10/29/2016 FINDINGS: The lungs are clear. There is no pleural effusion or pneumothorax. The cardiac silhouette is within normal limits. There is large amount of colonic stool burden. There is no bowel dilatation or evidence of obstruction. No free air. Radiopaque densities in the right upper quadrant may correspond to the gallstones seen on the prior CT. The osseous structures and soft tissues appear unremarkable. IMPRESSION: 1. No acute cardiopulmonary process. 2. Constipation.  No bowel obstruction. 3. Probable gallstones. Electronically Signed   By: Anner Crete M.D.   On: 11/18/2016 00:38    Tyreque Finken, DO  [facility] Pager [phone]  If 7PM-7AM, please contact night-coverage [email or web address] Password [email or web address] 11/19/2016, 11:57 AM   LOS: 0 days

## 2016-11-19 NOTE — Progress Notes (Signed)
PT back from CT. Pt stable. Pt agitated and in pain. Pain med to be administered. Fluids restarted

## 2016-11-20 DIAGNOSIS — Z85528 Personal history of other malignant neoplasm of kidney: Secondary | ICD-10-CM | POA: Diagnosis not present

## 2016-11-20 DIAGNOSIS — Z87891 Personal history of nicotine dependence: Secondary | ICD-10-CM | POA: Diagnosis not present

## 2016-11-20 DIAGNOSIS — I10 Essential (primary) hypertension: Secondary | ICD-10-CM | POA: Diagnosis not present

## 2016-11-20 DIAGNOSIS — E1122 Type 2 diabetes mellitus with diabetic chronic kidney disease: Secondary | ICD-10-CM | POA: Diagnosis present

## 2016-11-20 DIAGNOSIS — I160 Hypertensive urgency: Secondary | ICD-10-CM | POA: Diagnosis present

## 2016-11-20 DIAGNOSIS — F1410 Cocaine abuse, uncomplicated: Secondary | ICD-10-CM | POA: Diagnosis present

## 2016-11-20 DIAGNOSIS — K828 Other specified diseases of gallbladder: Secondary | ICD-10-CM | POA: Diagnosis not present

## 2016-11-20 DIAGNOSIS — I129 Hypertensive chronic kidney disease with stage 1 through stage 4 chronic kidney disease, or unspecified chronic kidney disease: Secondary | ICD-10-CM | POA: Diagnosis present

## 2016-11-20 DIAGNOSIS — Z905 Acquired absence of kidney: Secondary | ICD-10-CM | POA: Diagnosis not present

## 2016-11-20 DIAGNOSIS — Z9114 Patient's other noncompliance with medication regimen: Secondary | ICD-10-CM | POA: Diagnosis not present

## 2016-11-20 DIAGNOSIS — R109 Unspecified abdominal pain: Secondary | ICD-10-CM | POA: Diagnosis not present

## 2016-11-20 DIAGNOSIS — K8500 Idiopathic acute pancreatitis without necrosis or infection: Secondary | ICD-10-CM

## 2016-11-20 DIAGNOSIS — I471 Supraventricular tachycardia: Secondary | ICD-10-CM | POA: Diagnosis present

## 2016-11-20 DIAGNOSIS — N183 Chronic kidney disease, stage 3 (moderate): Secondary | ICD-10-CM | POA: Diagnosis present

## 2016-11-20 DIAGNOSIS — K7460 Unspecified cirrhosis of liver: Secondary | ICD-10-CM | POA: Diagnosis present

## 2016-11-20 DIAGNOSIS — K8020 Calculus of gallbladder without cholecystitis without obstruction: Secondary | ICD-10-CM | POA: Diagnosis present

## 2016-11-20 DIAGNOSIS — K219 Gastro-esophageal reflux disease without esophagitis: Secondary | ICD-10-CM | POA: Diagnosis present

## 2016-11-20 DIAGNOSIS — Z882 Allergy status to sulfonamides status: Secondary | ICD-10-CM | POA: Diagnosis not present

## 2016-11-20 DIAGNOSIS — Z90710 Acquired absence of both cervix and uterus: Secondary | ICD-10-CM | POA: Diagnosis not present

## 2016-11-20 DIAGNOSIS — E876 Hypokalemia: Secondary | ICD-10-CM | POA: Diagnosis present

## 2016-11-20 DIAGNOSIS — B182 Chronic viral hepatitis C: Secondary | ICD-10-CM | POA: Diagnosis present

## 2016-11-20 DIAGNOSIS — K567 Ileus, unspecified: Secondary | ICD-10-CM | POA: Diagnosis present

## 2016-11-20 DIAGNOSIS — J45909 Unspecified asthma, uncomplicated: Secondary | ICD-10-CM | POA: Diagnosis present

## 2016-11-20 LAB — BASIC METABOLIC PANEL
ANION GAP: 6 (ref 5–15)
BUN: 11 mg/dL (ref 6–20)
CHLORIDE: 102 mmol/L (ref 101–111)
CO2: 31 mmol/L (ref 22–32)
CREATININE: 1.05 mg/dL — AB (ref 0.44–1.00)
Calcium: 8.7 mg/dL — ABNORMAL LOW (ref 8.9–10.3)
GFR calc non Af Amer: 55 mL/min — ABNORMAL LOW (ref >60)
GLUCOSE: 89 mg/dL (ref 65–99)
Potassium: 4.2 mmol/L (ref 3.5–5.1)
Sodium: 139 mmol/L (ref 135–145)

## 2016-11-20 LAB — GLUCOSE, CAPILLARY
GLUCOSE-CAPILLARY: 89 mg/dL (ref 65–99)
GLUCOSE-CAPILLARY: 115 mg/dL — AB (ref 65–99)

## 2016-11-20 MED ORDER — CARVEDILOL 3.125 MG PO TABS: 3.1250 mg | ORAL_TABLET | Freq: Two times a day (BID) | ORAL | 1 refills | Status: DC

## 2016-11-20 NOTE — Care Management Important Message (Signed)
Important Message  Patient Details  Name: Cassandra Jordan MRN: [id number] Date of Birth: Mar 25, 1950   Medicare Important Message Given:  Yes    Sherald Barge, RN 11/20/2016, 12:30 PM

## 2016-11-20 NOTE — Progress Notes (Signed)
  Subjective: Patient states her abdominal pain has resolved.  Objective: Vital signs in last 24 hours: Temp:  [97.8 F (36.6 C)-98.3 F (36.8 C)] 98.3 F (36.8 C) (10/30 0617) Pulse Rate:  [65-66] 66 (10/30 0617) Resp:  [15-18] 18 (10/30 0617) BP: (143-147)/(70-71) 147/71 (10/30 0617) SpO2:  [95 %-96 %] 95 % (10/30 0617) Last BM Date: 11/17/16  Intake/Output from previous day: 10/29 0701 - 10/30 0700 In: 2697.5 [P.O.:1080; I.V.:1617.5] Out: -  Intake/Output this shift: No intake/output data recorded.  General appearance: alert, cooperative and no distress GI: soft, non-tender; bowel sounds normal; no masses,  no organomegaly  Lab Results:   Recent Labs  11/17/16 2319 11/19/16 0435  WBC 9.1 8.4  HGB 13.3 12.9  HCT 40.9 41.8  PLT 260 213   BMET  Recent Labs  11/19/16 0435 11/20/16 0436  NA 136 139  K 4.1 4.2  CL 99* 102  CO2 30 31  GLUCOSE 97 89  BUN 12 11  CREATININE 1.03* 1.05*  CALCIUM 8.6* 8.7*   PT/INR No results for input(s): LABPROT, INR in the last 72 hours.  Studies/Results: Ct Angio Chest Pe W Or Wo Contrast  Result Date: 11/19/2016 CLINICAL DATA:  Elevated D-dimer.  Chest pain short of breath. EXAM: CT ANGIOGRAPHY CHEST WITH CONTRAST TECHNIQUE: Multidetector CT imaging of the chest was performed using the standard protocol during bolus administration of intravenous contrast. Multiplanar CT image reconstructions and MIPs were obtained to evaluate the vascular anatomy. CONTRAST:  100 mL Isovue COMPARISON:  None. FINDINGS: Cardiovascular: No filling defects within the pulmonary arteries to suggest acute pulmonary embolism. No acute findings of the aorta or great vessels. No pericardial fluid. Mediastinum/Nodes: No axillary supraclavicular adenopathy. No mediastinal hilar adenopathy. No pericardial fluid. Esophagus normal. Lungs/Pleura: Bibasilar atelectasis small bowel. Focal infiltrate. No pulmonary edema. No pulmonary infarction Small nodule in the  RIGHT middle lobe measures 6 mm (image 77, series 7). Upper Abdomen: Elevation RIGHT hemidiaphragm. Musculoskeletal: No aggressive osseous lesion. Review of the MIP images confirms the above findings. IMPRESSION: 1. No acute pulmonary embolism. 2. Mild basilar atelectasis. 3. Small RIGHT middle lobe nodule. No follow-up needed if patient is low-risk. Non-contrast chest CT can be considered in 12 months if patient is high-risk. This recommendation follows the consensus statement: Guidelines for Management of Incidental Pulmonary Nodules Detected on CT Images: From the Fleischner Society 2017; Radiology 2017; 284:228-243. Electronically Signed   By: Suzy Bouchard M.D.   On: 11/19/2016 20:28   Dg Chest Port 1 View  Result Date: 11/18/2016 CLINICAL DATA:  T4-5 day history of worsening abdominal pain. Recent hospitalization for small bowel obstruction. EXAM: PORTABLE CHEST 1 VIEW COMPARISON:  Radiographs 10/29/2016 and 11/18/2016. FINDINGS: 1548 hours. The heart size and mediastinal contours are normal. The lungs are clear. There is no pleural effusion or pneumothorax. No acute osseous findings are identified. An apparent button overlaps the tracheal air column. Telemetry leads overlie the chest. IMPRESSION: No active cardiopulmonary process. Electronically Signed   By: Richardean Sale M.D.   On: 11/18/2016 17:42    Anti-infectives: Anti-infectives    None      Assessment/Plan: Impression: Abdominal pain has resolved.  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 Plan: Dr. Barry Dienes of [facility] has agreed to see the patient as an outpatient.  Will call to schedule outpatient visit.  This was explained to the patient, who understands and agrees.  LOS: 0 days    Aviva Signs 11/20/2016

## 2016-11-20 NOTE — Progress Notes (Signed)
NURSING PROGRESS NOTE  DIMA MINI [id number] Discharge Data: 11/20/2016 1:47 PM Attending Provider: Orson Eva, MD XAJ:OINOMVEH, Richardson Landry, MD     [name] to be D/C'd Home per MD order.  Discussed with the patient the After Visit Summary and all questions fully answered. All IV's discontinued with no bleeding noted. All belongings returned to patient for patient to take home.   Last Vital Signs:  Blood pressure (!) 147/71, pulse 66, temperature 98.3 F (36.8 C), temperature source Oral, resp. rate 18, height 5' (1.524 m), weight 63.8 kg (140 lb 11.2 oz), SpO2 95 %.  Discharge Medication List Allergies as of 11/20/2016      Reactions   Sulfa Antibiotics Itching, Other (See Comments)   burning      Medication List    STOP taking these medications   metoprolol succinate 50 MG 24 hr tablet Commonly known as:  TOPROL-XL   metroNIDAZOLE 500 MG tablet Commonly known as:  FLAGYL     TAKE these medications   amiodarone 100 MG tablet Commonly known as:  PACERONE TAKE ONE TABLET BY MOUTH ONCE DAILY -   amLODipine 10 MG tablet Commonly known as:  NORVASC Take 1 tablet (10 mg total) by mouth daily.   carvedilol 3.125 MG tablet Commonly known as:  COREG Take 1 tablet (3.125 mg total) by mouth 2 (two) times daily with a meal.   gabapentin 300 MG capsule Commonly known as:  NEURONTIN Take 1 capsule (300 mg total) by mouth 2 (two) times daily. What changed:  how much to take  when to take this   insulin detemir 100 UNIT/ML injection Commonly known as:  LEVEMIR Inject 3 Units into the skin 3 (three) times daily.   pantoprazole 40 MG tablet Commonly known as:  PROTONIX Take 1 tablet (40 mg total) by mouth daily. Take 30 minutes before breakfast

## 2016-11-20 NOTE — Care Management Note (Addendum)
Case Management Note  Patient Details  Name: Cassandra Jordan MRN: [id number] Date of Birth: [birth year]  Subjective/Objective:      Admitted with abdominal pain. Pt seen for transition of care planning. Pt from home, lives alone. She has dtr who lives in [place] but does not help, "does not need to help". She is ind with ADL's, drives herself to appointments and shopping. Pt has no DME needs pta. She has no HH needs pta. Pt has PCP - knowlton, pt admits to not seeing him regularly, she does not know why. Per MD note pt was not taking medications 2-3 days prior to admission, when asked why, she said she did not have a reason. Pt says she has her medications at home, she has refills for the medications and she can afford to get refills filled. Pt communicates no needs or concerns about transition home. Pt trying to call dtr on bedside phone.                Action/Plan: Going home today. No CM needs noted. Pt referred for emmi transition calls, flyer placed with DC information.   Expected Discharge Date:  11/20/16               Expected Discharge Plan:  Home/Self Care  In-House Referral:  NA  Discharge planning Services  CM Consult  Post Acute Care Choice:  NA Choice offered to:  NA  Status of Service:  Completed, signed off   Sherald Barge, RN 11/20/2016, 12:25 PM

## 2017-01-03 ENCOUNTER — Telehealth: Payer: Self-pay | Admitting: Internal Medicine

## 2017-01-03 ENCOUNTER — Ambulatory Visit: Payer: Medicare Other | Admitting: Gastroenterology

## 2017-01-03 NOTE — Telephone Encounter (Signed)
PATIENT WAS A NO SHOW AND LETTER SENT  °

## 2017-01-07 ENCOUNTER — Other Ambulatory Visit: Payer: Self-pay | Admitting: General Surgery

## 2017-01-07 DIAGNOSIS — K8010 Calculus of gallbladder with chronic cholecystitis without obstruction: Secondary | ICD-10-CM | POA: Diagnosis not present

## 2017-01-07 DIAGNOSIS — K828 Other specified diseases of gallbladder: Secondary | ICD-10-CM | POA: Diagnosis not present

## 2017-01-07 DIAGNOSIS — K851 Biliary acute pancreatitis without necrosis or infection: Secondary | ICD-10-CM | POA: Diagnosis not present

## 2017-01-07 DIAGNOSIS — K7460 Unspecified cirrhosis of liver: Secondary | ICD-10-CM | POA: Diagnosis not present

## 2017-01-07 DIAGNOSIS — I471 Supraventricular tachycardia: Secondary | ICD-10-CM | POA: Diagnosis not present

## 2017-01-14 ENCOUNTER — Other Ambulatory Visit: Payer: Self-pay | Admitting: Cardiology

## 2017-01-30 NOTE — Pre-Procedure Instructions (Signed)
BULA CAVALIERI  01/30/2017      [facility] - [place], [place] - [id number] [place] [address] Phone: [phone] Fax: [place], [place] - [place] [address] [place] [address] Phone: [phone] Fax: [phone]    Your procedure is scheduled on Tuesday January 15.  Report to [facility] Admitting at 5:30 A.M.  Call this number if you have problems the morning of surgery:  [phone]   Remember:  Do not eat food or drink liquids after midnight.  Take these medicines the morning of surgery with A SIP OF WATER:   Amlodipine (norvasc) Amiodarone (pacerone) Carvedilol (coreg) Gabapentin (neurontin) Pantoprazole (protonix)  Take Half Dose of Levemir insulin the night before surgery. (1 unit) Take Half Dose of Levemir insulin the morning of surgery. (1 unit)     How to Manage Your Diabetes Before and After Surgery  Why is it important to control my blood sugar before and after surgery? . Improving blood sugar levels before and after surgery helps healing and can limit problems. . A way of improving blood sugar control is eating a healthy diet by: o  Eating less sugar and carbohydrates o  Increasing activity/exercise o  Talking with your doctor about reaching your blood sugar goals . High blood sugars (greater than 180 mg/dL) can raise your risk of infections and slow your recovery, so you will need to focus on controlling your diabetes during the weeks before surgery. . Make sure that the doctor who takes care of your diabetes knows about your planned surgery including the date and location.  How do I manage my blood sugar before surgery? . Check your blood sugar at least 4 times a day, starting 2 days before surgery, to make sure that the level is not too high or low. o Check your blood sugar the morning of your surgery when you wake up and every 2 hours until  you get to the Short Stay unit. . If your blood sugar is less than 70 mg/dL, you will need to treat for low blood sugar: o Do not take insulin. o Treat a low blood sugar (less than 70 mg/dL) with  cup of clear juice (cranberry or apple), 4 glucose tablets, OR glucose gel. Recheck blood sugar in 15 minutes after treatment (to make sure it is greater than 70 mg/dL). If your blood sugar is not greater than 70 mg/dL on recheck, call [phone] o  for further instructions. . Report your blood sugar to the short stay nurse when you get to Short Stay.  . If you are admitted to the hospital after surgery: o Your blood sugar will be checked by the staff and you will probably be given insulin after surgery (instead of oral diabetes medicines) to make sure you have good blood sugar levels. o The goal for blood sugar control after surgery is 80-180 mg/dL.               Do not wear jewelry, make-up or nail polish.  Do not wear lotions, powders, or perfumes, or deodorant.  Do not shave 48 hours prior to surgery.  Men may shave face and neck.  Do not bring valuables to the hospital.  [facility] is not responsible for any belongings or valuables.  Contacts, dentures or bridgework may not be worn into surgery.  Leave your suitcase in the car.  After surgery it may  be brought to your room.  For patients admitted to the hospital, discharge time will be determined by your treatment team.  Patients discharged the day of surgery will not be allowed to drive home.    Special instructions:    [place]- Preparing For Surgery  Before surgery, you can play an important role. Because skin is not sterile, your skin needs to be as free of germs as possible. You can reduce the number of germs on your skin by washing with CHG (chlorahexidine gluconate) Soap before surgery.  CHG is an antiseptic cleaner which kills germs and bonds with the skin to continue killing germs even after washing.  Please do  not use if you have an allergy to CHG or antibacterial soaps. If your skin becomes reddened/irritated stop using the CHG.  Do not shave (including legs and underarms) for at least 48 hours prior to first CHG shower. It is OK to shave your face.  Please follow these instructions carefully.   1. Shower the NIGHT BEFORE SURGERY and the MORNING OF SURGERY with CHG.   2. If you chose to wash your hair, wash your hair first as usual with your normal shampoo.  3. After you shampoo, rinse your hair and body thoroughly to remove the shampoo.  4. Use CHG as you would any other liquid soap. You can apply CHG directly to the skin and wash gently with a scrungie or a clean washcloth.   5. Apply the CHG Soap to your body ONLY FROM THE NECK DOWN.  Do not use on open wounds or open sores. Avoid contact with your eyes, ears, mouth and genitals (private parts). Wash Face and genitals (private parts)  with your normal soap.  6. Wash thoroughly, paying special attention to the area where your surgery will be performed.  7. Thoroughly rinse your body with warm water from the neck down.  8. DO NOT shower/wash with your normal soap after using and rinsing off the CHG Soap.  9. Pat yourself dry with a CLEAN TOWEL.  10. Wear CLEAN PAJAMAS to bed the night before surgery, wear comfortable clothes the morning of surgery  11. Place CLEAN SHEETS on your bed the night of your first shower and DO NOT SLEEP WITH PETS.    Day of Surgery: Do not apply any deodorants/lotions. Please wear clean clothes to the hospital/surgery center.      Please read over the following fact sheets that you were given. Coughing and Deep Breathing and Surgical Site Infection Prevention

## 2017-01-31 ENCOUNTER — Encounter (HOSPITAL_COMMUNITY): Payer: Self-pay

## 2017-01-31 ENCOUNTER — Other Ambulatory Visit: Payer: Self-pay

## 2017-01-31 ENCOUNTER — Encounter (HOSPITAL_COMMUNITY)
Admission: RE | Admit: 2017-01-31 | Discharge: 2017-01-31 | Disposition: A | Discharge status: Home / Self Care | Payer: Medicare Other | Source: Ambulatory Visit | Attending: General Surgery | Admitting: General Surgery

## 2017-01-31 DIAGNOSIS — F1490 Cocaine use, unspecified, uncomplicated: Secondary | ICD-10-CM | POA: Insufficient documentation

## 2017-01-31 DIAGNOSIS — K859 Acute pancreatitis without necrosis or infection, unspecified: Secondary | ICD-10-CM | POA: Diagnosis not present

## 2017-01-31 DIAGNOSIS — B1920 Unspecified viral hepatitis C without hepatic coma: Secondary | ICD-10-CM | POA: Insufficient documentation

## 2017-01-31 DIAGNOSIS — E119 Type 2 diabetes mellitus without complications: Secondary | ICD-10-CM | POA: Diagnosis not present

## 2017-01-31 DIAGNOSIS — Z905 Acquired absence of kidney: Secondary | ICD-10-CM | POA: Diagnosis not present

## 2017-01-31 DIAGNOSIS — I10 Essential (primary) hypertension: Secondary | ICD-10-CM | POA: Insufficient documentation

## 2017-01-31 DIAGNOSIS — K219 Gastro-esophageal reflux disease without esophagitis: Secondary | ICD-10-CM | POA: Insufficient documentation

## 2017-01-31 DIAGNOSIS — Z8553 Personal history of malignant neoplasm of renal pelvis: Secondary | ICD-10-CM | POA: Insufficient documentation

## 2017-01-31 DIAGNOSIS — K8010 Calculus of gallbladder with chronic cholecystitis without obstruction: Secondary | ICD-10-CM | POA: Diagnosis not present

## 2017-01-31 DIAGNOSIS — Z01812 Encounter for preprocedural laboratory examination: Secondary | ICD-10-CM | POA: Diagnosis not present

## 2017-01-31 DIAGNOSIS — F17200 Nicotine dependence, unspecified, uncomplicated: Secondary | ICD-10-CM | POA: Diagnosis not present

## 2017-01-31 LAB — CBC WITH DIFFERENTIAL/PLATELET
BASOS PCT: 1 %
Basophils Absolute: 0 10*3/uL (ref 0.0–0.1)
EOS ABS: 0.2 10*3/uL (ref 0.0–0.7)
Eosinophils Relative: 3 %
HCT: 42.2 % (ref 36.0–46.0)
HEMOGLOBIN: 14 g/dL (ref 12.0–15.0)
Lymphocytes Relative: 49 %
Lymphs Abs: 3.1 10*3/uL (ref 0.7–4.0)
MCH: 29.5 pg (ref 26.0–34.0)
MCHC: 33.2 g/dL (ref 30.0–36.0)
MCV: 88.8 fL (ref 78.0–100.0)
MONOS PCT: 7 %
Monocytes Absolute: 0.4 10*3/uL (ref 0.1–1.0)
NEUTROS PCT: 40 %
Neutro Abs: 2.5 10*3/uL (ref 1.7–7.7)
Platelets: 217 10*3/uL (ref 150–400)
RBC: 4.75 MIL/uL (ref 3.87–5.11)
RDW: 13.1 % (ref 11.5–15.5)
WBC: 6.3 10*3/uL (ref 4.0–10.5)

## 2017-01-31 LAB — COMPREHENSIVE METABOLIC PANEL
ALBUMIN: 2.8 g/dL — AB (ref 3.5–5.0)
ALT: 31 U/L (ref 14–54)
ANION GAP: 8 (ref 5–15)
AST: 47 U/L — ABNORMAL HIGH (ref 15–41)
Alkaline Phosphatase: 51 U/L (ref 38–126)
BUN: 24 mg/dL — ABNORMAL HIGH (ref 6–20)
CHLORIDE: 98 mmol/L — AB (ref 101–111)
CO2: 29 mmol/L (ref 22–32)
Calcium: 8.9 mg/dL (ref 8.9–10.3)
Creatinine, Ser: 1.36 mg/dL — ABNORMAL HIGH (ref 0.44–1.00)
GFR calc Af Amer: 46 mL/min — ABNORMAL LOW (ref >60)
GFR calc non Af Amer: 40 mL/min — ABNORMAL LOW (ref >60)
GLUCOSE: 218 mg/dL — AB (ref 65–99)
Potassium: 3.7 mmol/L (ref 3.5–5.1)
SODIUM: 135 mmol/L (ref 135–145)
Total Bilirubin: 0.6 mg/dL (ref 0.3–1.2)
Total Protein: 6.7 g/dL (ref 6.5–8.1)

## 2017-01-31 LAB — RAPID URINE DRUG SCREEN, HOSP PERFORMED
AMPHETAMINES: NOT DETECTED
Barbiturates: NOT DETECTED
Benzodiazepines: NOT DETECTED
Cocaine: POSITIVE — AB
OPIATES: NOT DETECTED
TETRAHYDROCANNABINOL: NOT DETECTED

## 2017-01-31 LAB — PROTIME-INR
INR: 0.96
Prothrombin Time: 12.7 seconds (ref 11.4–15.2)

## 2017-01-31 LAB — TYPE AND SCREEN
ABO/RH(D): A POS
Antibody Screen: NEGATIVE

## 2017-01-31 LAB — GLUCOSE, CAPILLARY: Glucose-Capillary: 164 mg/dL — ABNORMAL HIGH (ref 65–99)

## 2017-01-31 LAB — HEMOGLOBIN A1C
HEMOGLOBIN A1C: 6.7 % — AB (ref 4.8–5.6)
Mean Plasma Glucose: 145.59 mg/dL

## 2017-01-31 MED ORDER — CHLORHEXIDINE GLUCONATE CLOTH 2 % EX PADS: 6.0000 | MEDICATED_PAD | Freq: Once | CUTANEOUS | Status: DC

## 2017-01-31 NOTE — Progress Notes (Signed)
Pt is current drug user,I informed her not to use as of now.

## 2017-02-01 ENCOUNTER — Ambulatory Visit (HOSPITAL_COMMUNITY): Payer: Medicare Other | Admitting: Emergency Medicine

## 2017-02-01 LAB — HCV RNA QUANT: HCV QUANT: UNDETERMINED [IU]/mL (ref >50)

## 2017-02-01 LAB — ABO/RH: ABO/RH(D): A POS

## 2017-02-01 NOTE — Progress Notes (Signed)
Anesthesia Chart Review:  Pt is a 67 year old female scheduled for laparoscopic cholecystectomy with intraoperative cholangiogram on 02/05/2017 with [name], MD  PMH includes:  PSVT, HTN, DM, hyperlipidemia, hepatitis C, asthma, renal cell carcinoma (s/p R nephrectomy), GERD. Current smoker. Reports current cocaine use (last 01/30/17). BMI 27  - Hospitalized 10/27-30/18 for abdominal pain  - Hospitalized 10/8-14/18 for small bowel obstruction  Medications include: Amiodarone, amlodipine, carvedilol, clonidine, Levemir, lisinopril-HCTZ, Protonix   BP 111/68   Pulse 68   Temp 36.6 C   Resp 18   Ht 5' (1.524 m)   Wt 137 lb 8 oz (62.4 kg)   SpO2 99%   BMI 26.85 kg/m   Preoperative labs reviewed.   - HbA1c 6.7, glucose 218 - UDS positive for cocaine - HCV RNA quant pending - Dr. Barry Dienes has reviewed lab results.   EKG 01/31/17: Sinus rhythm with 1st degree A-V block. Prolonged QT  CT angio chest 11/19/16:  1. No acute pulmonary embolism. 2. Mild basilar atelectasis. 3. Small RIGHT middle lobe nodule. No follow-up needed if patient is low-risk. Non-contrast chest CT can be considered in 12 months if patient is high-risk.   Nuclear stress test 02/25/14:  1. No reversible ischemia or infarction. 2. Normal left ventricular wall motion. 3. Left ventricular ejection fraction 64% 4. Low-risk stress test findings  Echo 10/15/13:  - Left ventricle: The cavity size was normal. There was moderate concentric hypertrophy. Systolic function was vigorous. Theestimated ejection fraction was in the range of 65% to 70%. Therewas dynamic obstruction at restin the mid cavity, with a peakvelocity of 3 cm/sec and a peak gradient of 36 mm Hg. Wall motionwas normal; there were no regional wall motion abnormalities. Doppler parameters are consistent with abnormal left ventricular relaxation (grade 1 diastolic dysfunction).  Reviewed case with Dr. Jenita Seashore.  Pre-admission testing RN instructed pt to  abstain from cocaine use from PAT until surgery. Will repeat UDS day of surgery.   If UDS negative for cocaine, I anticipate pt can proceed as scheduled.   Willeen Cass, FNP-BC [facility] Short Stay Surgical Center/Anesthesiology Phone: [phone] 02/01/2017 3:46 PM

## 2017-02-04 NOTE — H&P (Signed)
Cassandra Jordan Documented: 01/07/2017 8:52 AM Location: Cassandra Jordan Patient #: [id number] DOB: 1950-11-09 Divorced / Language: [name] / Race: Black or African American Female   History of Present Illness [name] MD; 01/07/2017 9:33 AM) The patient is a 67 year old female who presents for evaluation of gall stones. Patient is a 67 year old female referred by Dr. Arnoldo Morale for gallstone pancreatitis. The patient has a history of hepatitis C and cirrhosis. She also has PSVT. It is felt that she would benefit from cholecystectomy at a larger hospital is reasonable. She presented to any pen was 4-5 day history of abdominal pain. She was found to have pancreatitis. CT performed at that time showed gallbladder calcifications as well as inflammation around the head of the pancreas and uncinate process. At that point her urine drug screen was positive for cocaine. The patient denies abdominal pain since her discharge. She has been able to eat and is doing well. She denies nausea, vomiting, or diarrhea. She is not drinking.  CT abd/pelvis 11/18/16 IMPRESSION: 1. Inflammatory changes of the uncinate process of the pancreas most consistent with acute pancreatitis. Correlation with clinical exam and pancreatic enzymes recommended. 2. Mildly dilated and fluid-filled appendix measuring up to 14 mm. Interval resolution of the pericecal inflammatory changes seen on the prior CT. Correlation with clinical exam is recommended to exclude appendicitis. 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 4. Cirrhosis. 5. Colonic diverticulosis without active inflammatory changes. 6. Coronary vascular calcification. 7. Aortic Atherosclerosis (ICD10-I70.0).  11/19/2016 labs CMET near normal except Albumin 2.5 CBC with normal plt count   Past Surgical History ([name], CMA; 01/07/2017 9:21 AM) Hysterectomy (not due to cancer) - Complete  Nephrectomy   Right.  Diagnostic Studies History ([name], [place]; 01/07/2017 9:21 AM) Colonoscopy  1-5 years ago  Allergies [name], [place]; 01/07/2017 8:53 AM) Sulfa Drugs  Allergies Reconciled   Medication History [name], RMA; 01/07/2017 8:57 AM) LORazepam (1MG Tablet, Oral) Active. Carvedilol (3.125MG Tablet, Oral) Active. Gabapentin (600MG Tablet, Oral) Active. Lisinopril-Hydrochlorothiazide (20-25MG Tablet, Oral) Active. Protonix (40MG Packet, Oral) Active. Norvasc (10MG Tablet, Oral) Active. Medications Reconciled Levemir FlexTouch (100UNIT/ML Soln Pen-inj, Subcutaneous) Active.  Social History ([name], [place]; 01/07/2017 9:21 AM) Alcohol use  Recently quit alcohol use. Illicit drug use  Prefer to discuss with provider. Tobacco use  Current some day smoker.  Family History ([name], [place]; 01/07/2017 9:21 AM) Family history unknown  First Degree Relatives   Other Problems ([name], [place]; 01/07/2017 9:21 AM) High blood pressure     Review of Systems ([name] CMA; 01/07/2017 9:21 AM) General Present- Weight Loss. Not Present- Appetite Loss, Chills, Fatigue, Fever, Night Sweats and Weight Gain. Skin Not Present- Change in Wart/Mole, Dryness, Hives, Jaundice, New Lesions, Non-Healing Wounds, Rash and Ulcer. HEENT Present- Seasonal Allergies. Not Present- Earache, Hearing Loss, Hoarseness, Nose Bleed, Oral Ulcers, Ringing in the Ears, Sinus Pain, Sore Throat, Visual Disturbances, Wears glasses/contact lenses and Yellow Eyes. Respiratory Not Present- Bloody sputum, Chronic Cough, Difficulty Breathing, Snoring and Wheezing. Breast Not Present- Breast Mass, Breast Pain, Nipple Discharge and Skin Changes. Cardiovascular Not Present- Chest Pain, Difficulty Breathing Lying Down, Leg Cramps, Palpitations, Rapid Heart Rate, Shortness of Breath and Swelling of Extremities. Gastrointestinal Not Present- Abdominal Pain, Bloating, Bloody Stool, Change in  Bowel Habits, Chronic diarrhea, Constipation, Difficulty Swallowing, Excessive gas, Gets full quickly at meals, Hemorrhoids, Indigestion, Nausea, Rectal Pain and Vomiting. Female Genitourinary Not Present- Frequency, Nocturia, Painful Urination, Pelvic Pain and Urgency. Musculoskeletal Not  Present- Back Pain, Joint Pain, Joint Stiffness, Muscle Pain, Muscle Weakness and Swelling of Extremities. Neurological Present- Decreased Memory. Not Present- Fainting, Headaches, Numbness, Seizures, Tingling, Tremor, Trouble walking and Weakness. Endocrine Not Present- Cold Intolerance, Excessive Hunger, Hair Changes, Heat Intolerance, Hot flashes and New Diabetes. Hematology Not Present- Blood Thinners, Easy Bruising, Excessive bleeding, Gland problems, HIV and Persistent Infections.  Vitals [name] RMA; 01/07/2017 8:53 AM) 01/07/2017 8:53 AM Weight: 139.5 lb Height: 60in Body Surface Area: 1.6 m Body Mass Index: 27.24 kg/m  Temp.: 97.62F  Pulse: 88 (Regular)  BP: 160/85 (Sitting, Left Arm, Standard)       Physical Exam [name] MD; 01/07/2017 9:36 AM) General Mental Status-Alert. General Appearance-Consistent with stated age. Hydration-Well hydrated. Voice-Normal.  Head and Neck Head-normocephalic, atraumatic with no lesions or palpable masses. Trachea-midline. Thyroid Gland Characteristics - normal size and consistency.  Eye Eyeball - Bilateral-Extraocular movements intact. Sclera/Conjunctiva - Bilateral-No scleral icterus.  Chest and Lung Exam Chest and lung exam reveals -quiet, even and easy respiratory effort with no use of accessory muscles and on auscultation, normal breath sounds, no adventitious sounds and normal vocal resonance. Inspection Chest Wall - Normal. Back - normal.  Cardiovascular Cardiovascular examination reveals -normal heart sounds, regular rate and rhythm with no murmurs and normal pedal pulses  bilaterally.  Abdomen Inspection Inspection of the abdomen reveals - No Hernias. Palpation/Percussion Palpation and Percussion of the abdomen reveal - Soft, No Rebound tenderness, No Rigidity (guarding) and No hepatosplenomegaly. Note: mild RUQ tenderness. Auscultation Auscultation of the abdomen reveals - Bowel sounds normal. Note: midline extraction site from nephrectomy. pfannensteil from hysterectomy   Neurologic Neurologic evaluation reveals -alert and oriented x 3 with no impairment of recent or remote memory. Mental Status-Normal.  Musculoskeletal Global Assessment -Note: no gross deformities.  Normal Exam - Left-Upper Extremity Strength Normal and Lower Extremity Strength Normal. Normal Exam - Right-Upper Extremity Strength Normal and Lower Extremity Strength Normal.  Lymphatic Head & Neck  General Head & Neck Lymphatics: Bilateral - Description - Normal. Axillary  General Axillary Region: Bilateral - Description - Normal. Tenderness - Non Tender. Femoral & Inguinal  Generalized Femoral & Inguinal Lymphatics: Bilateral - Description - No Generalized lymphadenopathy.    Assessment & Plan [name] MD; 01/07/2017 9:36 AM) CHRONIC CHOLECYSTITIS WITH CALCULUS (K80.10) Impression: See below GALLSTONE PANCREATITIS (K85.10) Impression: The patient has multiple reasons for gallbladder removal. The most pressing is that she had gallstone pancreatitis most likely. There is a small chance that the pancreatitis was caused from drug use, but more likely from gallstones. She also has chronic cholecystitis based on the faint right upper quadrant tenderness and the porcelain gallbladder. This will be a slightly more risky because of her cirrhosis seen on imaging. Her liver function tests, coagulations, and platelet count are good for the cirrhosis is likely not severe. I did discuss that she is at higher risk for bleeding. I reviewed the risks of Jordan with her and  her daughter, [name]. We discussed risks of bleeding, infection, damage to adjacent structures, possible need for additional procedures or surgeries, possible heart or lung complications, possible blood clot, other. I discussed that I typically would send her home unless she is having issues postoperatively. She certainly could have irregular heart rhythms which could cause her to stay in the hospital, or other pain or bleeding. We will schedule this at the first gradually available opportunity. I did state that she will need to have this done and this is not truly elective. 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 She has had a renal cancer and numerous colon polyps. She does not have any family history of gallbladder cancer. CIRRHOSIS (K74.60) Impression: See above. This is likely secondary to hepatitis C. I will get hepatitis C viral RNA quant. PSVT (PAROXYSMAL SUPRAVENTRICULAR TACHYCARDIA) (I47.1) Impression: Stay on amiodarone. Current Plans Pt Education - Laparoscopic Cholecystectomy: gallbladder   Signed by [name], MD (01/07/2017 9:37 AM)

## 2017-02-04 NOTE — Anesthesia Preprocedure Evaluation (Addendum)
Anesthesia Evaluation  Patient identified by MRN, date of birth, ID band Patient awake    Reviewed: Allergy & Precautions, NPO status , Patient's Chart, lab work & pertinent test results  Airway Mallampati: II  TM Distance: >3 FB Neck ROM: Full    Dental  (+) Dental Advisory Given, Poor Dentition, Chipped, Missing,    Pulmonary neg pulmonary ROS, Current Smoker,    Pulmonary exam normal        Cardiovascular hypertension, negative cardio ROS Normal cardiovascular exam Rhythm:Regular Rate:Normal  Nl cardiolyte 04/2014   Neuro/Psych negative neurological ROS  negative psych ROS   GI/Hepatic negative GI ROS, Neg liver ROS, (+) Hepatitis -, C  Endo/Other  negative endocrine ROSdiabetes  Renal/GU CRFRenal diseasenegative Renal ROS  negative genitourinary   Musculoskeletal negative musculoskeletal ROS (+)   Abdominal   Peds  Hematology negative hematology ROS (+)   Anesthesia Other Findings  Ref Range & Units 5d ago 37moago 266yrgo Opiates NONE DETECTED NONE DETECTED  POSITIVE Abnormal   NONE DETECTED  Cocaine NONE DETECTED POSITIVE Abnormal   POSITIVE Abnormal   POSITIVE Abnormal   Benzodiazepines NONE DETECTED NONE DETECTED  POSITIVE Abnormal   NONE DETECTED  Repeating tox screen this AM  Reproductive/Obstetrics                            Lab Results  Component Value Date   WBC 6.3 01/31/2017   HGB 14.0 01/31/2017   HCT 42.2 01/31/2017   MCV 88.8 01/31/2017   PLT 217 01/31/2017   Lab Results  Component Value Date   CREATININE 1.36 (H) 01/31/2017   BUN 24 (H) 01/31/2017   NA 135 01/31/2017   K 3.7 01/31/2017   CL 98 (L) 01/31/2017   CO2 29 01/31/2017    Anesthesia Physical Anesthesia Plan  ASA: III  Anesthesia Plan: General   Post-op Pain Management:    Induction:   PONV Risk Score and Plan: 2 and Treatment may vary due to age or medical condition, Ondansetron and  Dexamethasone  Airway Management Planned:   Additional Equipment:   Intra-op Plan:   Post-operative Plan: Extubation in OR  Informed Consent: I have reviewed the patients History and Physical, chart, labs and discussed the procedure including the risks, benefits and alternatives for the proposed anesthesia with the patient or authorized representative who has indicated his/her understanding and acceptance.   Dental advisory given  Plan Discussed with: CRNA and Anesthesiologist  Anesthesia Plan Comments:        Anesthesia Quick Evaluation

## 2017-02-05 ENCOUNTER — Encounter (HOSPITAL_COMMUNITY): Payer: Self-pay | Admitting: Critical Care Medicine

## 2017-02-05 ENCOUNTER — Encounter (HOSPITAL_COMMUNITY)
Admission: RE | Disposition: A | Discharge status: Home / Self Care | Payer: Self-pay | Source: Ambulatory Visit | Attending: General Surgery

## 2017-02-05 ENCOUNTER — Ambulatory Visit (HOSPITAL_COMMUNITY)
Admission: RE | Admit: 2017-02-05 | Discharge: 2017-02-05 | Disposition: A | Discharge status: Home / Self Care | Payer: Medicare Other | Source: Ambulatory Visit | Attending: General Surgery | Admitting: General Surgery

## 2017-02-05 ENCOUNTER — Other Ambulatory Visit: Payer: Self-pay

## 2017-02-05 DIAGNOSIS — K8010 Calculus of gallbladder with chronic cholecystitis without obstruction: Secondary | ICD-10-CM | POA: Insufficient documentation

## 2017-02-05 DIAGNOSIS — K7460 Unspecified cirrhosis of liver: Secondary | ICD-10-CM | POA: Insufficient documentation

## 2017-02-05 DIAGNOSIS — Z905 Acquired absence of kidney: Secondary | ICD-10-CM | POA: Diagnosis not present

## 2017-02-05 DIAGNOSIS — I10 Essential (primary) hypertension: Secondary | ICD-10-CM | POA: Insufficient documentation

## 2017-02-05 DIAGNOSIS — Z5309 Procedure and treatment not carried out because of other contraindication: Secondary | ICD-10-CM | POA: Diagnosis not present

## 2017-02-05 DIAGNOSIS — K828 Other specified diseases of gallbladder: Secondary | ICD-10-CM | POA: Insufficient documentation

## 2017-02-05 DIAGNOSIS — F17200 Nicotine dependence, unspecified, uncomplicated: Secondary | ICD-10-CM | POA: Insufficient documentation

## 2017-02-05 DIAGNOSIS — I471 Supraventricular tachycardia: Secondary | ICD-10-CM | POA: Diagnosis not present

## 2017-02-05 DIAGNOSIS — Z85528 Personal history of other malignant neoplasm of kidney: Secondary | ICD-10-CM | POA: Insufficient documentation

## 2017-02-05 DIAGNOSIS — R825 Elevated urine levels of drugs, medicaments and biological substances: Secondary | ICD-10-CM | POA: Diagnosis not present

## 2017-02-05 DIAGNOSIS — Z794 Long term (current) use of insulin: Secondary | ICD-10-CM | POA: Diagnosis not present

## 2017-02-05 DIAGNOSIS — Z79899 Other long term (current) drug therapy: Secondary | ICD-10-CM | POA: Diagnosis not present

## 2017-02-05 LAB — RAPID URINE DRUG SCREEN, HOSP PERFORMED
AMPHETAMINES: NOT DETECTED
BARBITURATES: NOT DETECTED
BENZODIAZEPINES: NOT DETECTED
COCAINE: POSITIVE — AB
Opiates: NOT DETECTED
Tetrahydrocannabinol: NOT DETECTED

## 2017-02-05 LAB — GLUCOSE, CAPILLARY: Glucose-Capillary: 144 mg/dL — ABNORMAL HIGH (ref 65–99)

## 2017-02-05 SURGERY — LAPAROSCOPIC CHOLECYSTECTOMY WITH INTRAOPERATIVE CHOLANGIOGRAM
Anesthesia: General

## 2017-02-05 MED ORDER — CEFAZOLIN SODIUM-DEXTROSE 2-4 GM/100ML-% IV SOLN: 2.0000 g | INTRAVENOUS | Status: DC

## 2017-02-05 MED ORDER — CEFAZOLIN SODIUM-DEXTROSE 2-4 GM/100ML-% IV SOLN
INTRAVENOUS | Status: AC
  Filled 2017-02-05: qty 100

## 2017-02-05 MED ORDER — FENTANYL CITRATE (PF) 250 MCG/5ML IJ SOLN
INTRAMUSCULAR | Status: AC
  Filled 2017-02-05: qty 5

## 2017-02-05 MED ORDER — ACETAMINOPHEN 500 MG PO TABS
ORAL_TABLET | ORAL | Status: AC
  Administered 2017-02-05: 1000 mg
  Filled 2017-02-05: qty 2

## 2017-02-05 MED ORDER — GABAPENTIN 300 MG PO CAPS
ORAL_CAPSULE | ORAL | Status: AC
  Filled 2017-02-05: qty 1

## 2017-02-05 MED ORDER — MIDAZOLAM HCL 2 MG/2ML IJ SOLN
INTRAMUSCULAR | Status: AC
  Filled 2017-02-05: qty 2

## 2017-02-05 MED ORDER — PROPOFOL 10 MG/ML IV BOLUS
INTRAVENOUS | Status: AC
  Filled 2017-02-05: qty 20

## 2017-02-05 MED ORDER — ACETAMINOPHEN 500 MG PO TABS: 1000.0000 mg | ORAL_TABLET | ORAL | Status: DC

## 2017-02-05 MED ORDER — GABAPENTIN 300 MG PO CAPS: 300.0000 mg | ORAL_CAPSULE | ORAL | Status: DC

## 2017-02-08 ENCOUNTER — Emergency Department (HOSPITAL_COMMUNITY): Payer: Medicare Other

## 2017-02-08 ENCOUNTER — Emergency Department (HOSPITAL_COMMUNITY)
Admission: EM | Admit: 2017-02-08 | Discharge: 2017-02-08 | Discharge status: Against Medical Advice | Payer: Medicare Other | Attending: Emergency Medicine | Admitting: Emergency Medicine

## 2017-02-08 ENCOUNTER — Other Ambulatory Visit: Payer: Self-pay

## 2017-02-08 ENCOUNTER — Encounter (HOSPITAL_COMMUNITY): Payer: Self-pay | Admitting: Emergency Medicine

## 2017-02-08 DIAGNOSIS — N183 Chronic kidney disease, stage 3 (moderate): Secondary | ICD-10-CM | POA: Insufficient documentation

## 2017-02-08 DIAGNOSIS — J45909 Unspecified asthma, uncomplicated: Secondary | ICD-10-CM | POA: Insufficient documentation

## 2017-02-08 DIAGNOSIS — F1410 Cocaine abuse, uncomplicated: Secondary | ICD-10-CM | POA: Insufficient documentation

## 2017-02-08 DIAGNOSIS — E1122 Type 2 diabetes mellitus with diabetic chronic kidney disease: Secondary | ICD-10-CM | POA: Diagnosis not present

## 2017-02-08 DIAGNOSIS — R531 Weakness: Secondary | ICD-10-CM | POA: Insufficient documentation

## 2017-02-08 DIAGNOSIS — I129 Hypertensive chronic kidney disease with stage 1 through stage 4 chronic kidney disease, or unspecified chronic kidney disease: Secondary | ICD-10-CM | POA: Insufficient documentation

## 2017-02-08 DIAGNOSIS — F17210 Nicotine dependence, cigarettes, uncomplicated: Secondary | ICD-10-CM | POA: Diagnosis not present

## 2017-02-08 DIAGNOSIS — Z794 Long term (current) use of insulin: Secondary | ICD-10-CM | POA: Diagnosis not present

## 2017-02-08 DIAGNOSIS — Z79899 Other long term (current) drug therapy: Secondary | ICD-10-CM | POA: Insufficient documentation

## 2017-02-08 HISTORY — DX: Patient's noncompliance with other medical treatment and regimen: Z91.19

## 2017-02-08 HISTORY — DX: Alcohol abuse, uncomplicated: F10.10

## 2017-02-08 HISTORY — DX: Patient's noncompliance with other medical treatment and regimen due to unspecified reason: Z91.199

## 2017-02-08 LAB — COMPREHENSIVE METABOLIC PANEL
ALT: 48 U/L (ref 14–54)
AST: 72 U/L — AB (ref 15–41)
Albumin: 2.9 g/dL — ABNORMAL LOW (ref 3.5–5.0)
Alkaline Phosphatase: 56 U/L (ref 38–126)
Anion gap: 10 (ref 5–15)
BUN: 34 mg/dL — ABNORMAL HIGH (ref 6–20)
CHLORIDE: 99 mmol/L — AB (ref 101–111)
CO2: 28 mmol/L (ref 22–32)
CREATININE: 1.38 mg/dL — AB (ref 0.44–1.00)
Calcium: 9 mg/dL (ref 8.9–10.3)
GFR calc non Af Amer: 39 mL/min — ABNORMAL LOW (ref >60)
GFR, EST AFRICAN AMERICAN: 45 mL/min — AB (ref >60)
Glucose, Bld: 172 mg/dL — ABNORMAL HIGH (ref 65–99)
Potassium: 3.9 mmol/L (ref 3.5–5.1)
SODIUM: 137 mmol/L (ref 135–145)
Total Bilirubin: 0.4 mg/dL (ref 0.3–1.2)
Total Protein: 7.1 g/dL (ref 6.5–8.1)

## 2017-02-08 LAB — CBC WITH DIFFERENTIAL/PLATELET
BASOS ABS: 0 10*3/uL (ref 0.0–0.1)
Basophils Relative: 0 %
Eosinophils Absolute: 0.3 10*3/uL (ref 0.0–0.7)
Eosinophils Relative: 4 %
HEMATOCRIT: 40.9 % (ref 36.0–46.0)
Hemoglobin: 12.9 g/dL (ref 12.0–15.0)
LYMPHS PCT: 56 %
Lymphs Abs: 3.9 10*3/uL (ref 0.7–4.0)
MCH: 28.2 pg (ref 26.0–34.0)
MCHC: 31.5 g/dL (ref 30.0–36.0)
MCV: 89.3 fL (ref 78.0–100.0)
MONO ABS: 0.5 10*3/uL (ref 0.1–1.0)
Monocytes Relative: 7 %
NEUTROS ABS: 2.3 10*3/uL (ref 1.7–7.7)
NEUTROS PCT: 33 %
Platelets: 237 10*3/uL (ref 150–400)
RBC: 4.58 MIL/uL (ref 3.87–5.11)
RDW: 12.9 % (ref 11.5–15.5)
WBC: 6.9 10*3/uL (ref 4.0–10.5)

## 2017-02-08 LAB — URINALYSIS, ROUTINE W REFLEX MICROSCOPIC
Bilirubin Urine: NEGATIVE
GLUCOSE, UA: 50 mg/dL — AB
Hgb urine dipstick: NEGATIVE
Ketones, ur: NEGATIVE mg/dL
Nitrite: NEGATIVE
PROTEIN: 100 mg/dL — AB
Specific Gravity, Urine: 1.01 (ref 1.005–1.030)
pH: 6 (ref 5.0–8.0)

## 2017-02-08 LAB — CBG MONITORING, ED: GLUCOSE-CAPILLARY: 152 mg/dL — AB (ref 65–99)

## 2017-02-08 LAB — RAPID URINE DRUG SCREEN, HOSP PERFORMED
AMPHETAMINES: NOT DETECTED
BARBITURATES: NOT DETECTED
BENZODIAZEPINES: NOT DETECTED
Cocaine: POSITIVE — AB
Opiates: NOT DETECTED
Tetrahydrocannabinol: NOT DETECTED

## 2017-02-08 LAB — ETHANOL: Alcohol, Ethyl (B): <10 mg/dL (ref <10)

## 2017-02-08 LAB — I-STAT CG4 LACTIC ACID, ED
LACTIC ACID, VENOUS: 1.2 mmol/L (ref 0.5–1.9)
Lactic Acid, Venous: 1.4 mmol/L (ref 0.5–1.9)

## 2017-02-08 LAB — TROPONIN I

## 2017-02-08 MED ORDER — ASPIRIN 81 MG PO CHEW: 81.0000 mg | CHEWABLE_TABLET | Freq: Once | ORAL | Status: DC

## 2017-02-08 NOTE — ED Notes (Signed)
PT requested to leave.  Left without written instructions.  Refused aspirin or repeat vitals.

## 2017-02-08 NOTE — ED Notes (Signed)
Patient transported to MRI 

## 2017-02-08 NOTE — ED Notes (Signed)
Pt wanting to sign out AMA. Dr Thurnell Garbe notified

## 2017-02-08 NOTE — ED Notes (Signed)
Lactic acid done on Istat  Resulted 1.2

## 2017-02-08 NOTE — ED Provider Notes (Signed)
[facility] EMERGENCY DEPARTMENT Provider Note   CSN: [id number] Arrival date & time: 02/08/17  1145     History   Chief Complaint Chief Complaint  Patient presents with  . Weakness    HPI Cassandra Jordan is a 67 y.o. female.   Weakness     Pt was seen at 1315. Per pt, c/o gradual onset and persistence of constant generalized weakness that began PTA. Has been associated with generalized weakness. Symptoms began while she was cooking in her kitchen. LD cocaine 2 days ago. Denies any other symptoms. Denies CP/palpitations, no SOB/cough, no abd pain, no N/V/D, no fevers, no visual changes, no focal motor weakness, no tingling/numbness in extremities, no ataxia, no slurred speech, no facial droop.    Past Medical History:  Diagnosis Date  . Alcohol abuse   . Arthritis   . Asthma   . Cocaine abuse ([place])   . Essential hypertension   . GERD (gastroesophageal reflux disease)   . Hepatitis C   . Hyperlipidemia   . Lichen sclerosus et atrophicus 10/13/2014  . Neuropathic pain   . Noncompliance   . PSVT (paroxysmal supraventricular tachycardia) ([place])   . Rash   . Renal carcinoma ([place])   . Type 2 diabetes mellitus ([place])   . Vaginal irritation 10/01/2014  . Vaginal itching 10/01/2014  . Yeast infection of the vagina 10/01/2014    Patient Active Problem List   Diagnosis Date Noted  . Abdominal pain 11/18/2016  . Cholelithiasis 11/18/2016  . Uncontrolled type 2 diabetes mellitus with hyperglycemia, with long-term current use of insulin ([place]) 11/18/2016  . CKD (chronic kidney disease), stage III ([place]) 11/18/2016  . Acute pancreatitis   . Ileus ([place])   . Porcelain gallbladder   . Acute renal failure superimposed on stage 3 chronic kidney disease ([place]) 11/04/2016  . Hypokalemia 11/01/2016  . Hypomagnesemia 11/01/2016  . SBO (small bowel obstruction) ([place])   . AKI (acute kidney injury) ([place]) 10/30/2016  . Leukocytosis 10/30/2016  . GERD (gastroesophageal reflux disease)  10/29/2016  . Small bowel obstruction ([place]) 10/29/2016  . History of drug use 07/17/2016  . Constipation 05/30/2016  . Fecal incontinence 01/04/2016  . Hepatic cirrhosis ([place]) 04/08/2015  . History of adenomatous polyp of colon   . History of colonic polyps   . Hx of adenomatous colonic polyps 12/10/2014  . Lichen sclerosus et atrophicus 10/13/2014  . Vaginal itching 10/01/2014  . Vaginal irritation 10/01/2014  . Yeast infection of the vagina 10/01/2014  . Blurred vision, right eye 03/20/2014  . UTI (lower urinary tract infection) 03/20/2014  . Cocaine abuse ([place]) 03/20/2014  . Generalized weakness 03/20/2014  . Chest pain 02/10/2014  . Chest pain at rest 02/10/2014  . Type 2 diabetes mellitus ([place])   . Diabetes mellitus, type II ([place])   . Tobacco abuse   . PSVT (paroxysmal supraventricular tachycardia) ([place]) 11/04/2013  . Essential hypertension 10/16/2013  . Renal mass 10/14/2013  . Ovarian mass, left 08/17/2013  . Hepatitis C, chronic ([place]) 07/01/2013  . Elevated LFTs 07/01/2013  . Other dysphagia 07/01/2013  . Encounter for screening colonoscopy 07/01/2013    Past Surgical History:  Procedure Laterality Date  . ABDOMINAL HYSTERECTOMY    . COLONOSCOPY N/A 07/08/2013   RMR: Multiple colonic polyps treated [name] as described above. Inadequate prepartation comprimised examination there colonic diverticulosis. tubular and tubulovillous adenomas. next TCS 12/2013  . COLONOSCOPY N/A 02/09/2015   RMR: Colonic polyp removed as described above. Tubular adenoma. Next colonoscopy January 2022.  Marland Kitchen  ESOPHAGOGASTRODUODENOSCOPY N/A 07/08/2013   RMR: Schatzki's ring ; small hiatal hernia-status post passage of a Maloney dilator  . MALONEY DILATION N/A 07/08/2013   Procedure: [name] DILATION;  Surgeon: [name], MD;  Location: [facility] ENDO SUITE;  Service: Endoscopy;  Laterality: N/A;  . ROBOT ASSISTED LAPAROSCOPIC NEPHRECTOMY Right 10/14/2013   Procedure: ROBOTIC ASSISTED LAPAROSCOPIC  RADICAL NEPHRECTOMY;  Surgeon: [name], MD;  Location: [facility] ORS;  Service: Urology;  Laterality: Right;  . SAVORY DILATION N/A 07/08/2013   Procedure: SAVORY DILATION;  Surgeon: [name], MD;  Location: [facility] ENDO SUITE;  Service: Endoscopy;  Laterality: N/A;  . TONSILLECTOMY    . Wart removal      OB History    Gravida Para Term Preterm AB Living   1 1       1    SAB TAB Ectopic Multiple Live Births                   Home Medications    Prior to Admission medications   Medication Sig Start Date End Date Taking? Authorizing Provider  amiodarone (PACERONE) 100 MG tablet TAKE ONE TABLET BY MOUTH ONCE DAILY Patient taking differently: Take 100 mg by mouth daily. TAKE ONE TABLET BY MOUTH ONCE DAILY 01/14/17  Yes Satira Sark, MD  amLODipine (NORVASC) 10 MG tablet Take 1 tablet (10 mg total) by mouth daily. 11/04/16  Yes Tat, Shanon Brow, MD  carvedilol (COREG) 3.125 MG tablet Take 1 tablet (3.125 mg total) by mouth 2 (two) times daily with a meal. 11/20/16  Yes Tat, David, MD  cloNIDine (CATAPRES) 0.2 MG tablet Take 0.2 mg by mouth 2 (two) times daily.   Yes [provider]  gabapentin (NEURONTIN) 300 MG capsule Take 1 capsule (300 mg total) by mouth 2 (two) times daily. Patient taking differently: Take 600 mg by mouth 3 (three) times daily.  11/06/14  Yes Francine Graven, DO  HYDROCHLOROTHIAZIDE PO Take 12.5 mg by mouth daily. tablet   Yes [provider]  insulin detemir (LEVEMIR) 100 UNIT/ML injection Inject 3 Units into the skin 3 (three) times daily.    Yes [provider]  lisinopril-hydrochlorothiazide (PRINZIDE,ZESTORETIC) 20-25 MG tablet Take 1 tablet by mouth 2 (two) times daily.   Yes [provider]  pantoprazole (PROTONIX) 40 MG tablet Take 1 tablet (40 mg total) by mouth daily. Take 30 minutes before breakfast 03/06/16  Yes Annitta Needs, NP    Family History Family History  Problem Relation Age of Onset  . Colon cancer Sister  87  . Cancer Mother   . Heart attack Father   . Hypertension Daughter   . Cancer Maternal Grandfather   . Diabetes Paternal Grandmother     Social History Social History   Tobacco Use  . Smoking status: Current Some Day Smoker    Packs/day: 0.10    Years: 40.00    Pack years: 4.00    Types: Cigarettes  . Smokeless tobacco: Never Used  Substance Use Topics  . Alcohol use: No    Alcohol/week: 0.0 oz    Comment: rarely  . Drug use: Yes    Types: Marijuana, Cocaine    Comment: 3 days ago     Allergies   Sulfa antibiotics   Review of Systems Review of Systems  Neurological: Positive for weakness.  ROS: Statement: All systems negative except as marked or noted in the HPI; Constitutional: Negative for fever and chills. ; ; Eyes: Negative for eye pain, redness  and discharge. ; ; ENMT: Negative for ear pain, hoarseness, nasal congestion, sinus pressure and sore throat. ; ; Cardiovascular: Negative for chest pain, palpitations, diaphoresis, dyspnea and peripheral edema. ; ; Respiratory: Negative for cough, wheezing and stridor. ; ; Gastrointestinal: Negative for nausea, vomiting, diarrhea, abdominal pain, blood in stool, hematemesis, jaundice and rectal bleeding. . ; ; Genitourinary: Negative for dysuria, flank pain and hematuria. ; ; Musculoskeletal: Negative for back pain and neck pain. Negative for swelling and trauma.; ; Skin: Negative for pruritus, rash, abrasions, blisters, bruising and skin lesion.; ; Neuro: +lightheadedness, generalized weakness. Negative for headache and neck stiffness. Negative for altered level of consciousness, altered mental status, extremity weakness, paresthesias, involuntary movement, seizure and syncope.      Physical Exam Updated Vital Signs BP 128/73 (BP Location: Left Arm)   Pulse 68   Temp 98.4 F (36.9 C)   Resp 16   Ht 5' (1.524 m)   Wt 62.6 kg (138 lb)   SpO2 100%   BMI 26.95 kg/m     13:58:15 Orthostatic Vital Signs BD  Orthostatic  Lying   BP- Lying: 154/86  Pulse- Lying: 56      Orthostatic Sitting  BP- Sitting: 140/86  Pulse- Sitting: 60      Orthostatic Standing at 0 minutes  BP- Standing at 0 minutes: 134/83  Pulse- Standing at 0 minutes: 62     Physical Exam 1320: Physical examination:  Nursing notes reviewed; Vital signs and O2 SAT reviewed;  Constitutional: Well developed, Well nourished, Well hydrated, In no acute distress; Head:  Normocephalic, atraumatic; Eyes: EOMI, PERRL, No scleral icterus; ENMT: Mouth and pharynx normal, Mucous membranes moist; Neck: Supple, Full range of motion, No lymphadenopathy; Cardiovascular: Regular rate and rhythm, No gallop; Respiratory: Breath sounds clear & equal bilaterally, No wheezes.  Speaking full sentences with ease, Normal respiratory effort/excursion; Chest: Nontender, Movement normal; Abdomen: Soft, Nontender, Nondistended, Normal bowel sounds; Genitourinary: No CVA tenderness; Extremities: Pulses normal, No tenderness, No edema, No calf edema or asymmetry.; Neuro: AA&Ox3, Major CN grossly intact. Speech clear.  No facial droop.  No nystagmus. Grips equal. Strength 5/5 equal bilat UE's and LE's.  DTR 2/4 equal bilat UE's and LE's.  No gross sensory deficits.  Normal cerebellar testing bilat UE's (finger-nose) and LE's (heel-shin)..; Skin: Color normal, Warm, Dry.   ED Treatments / Results  Labs (all labs ordered are listed, but only abnormal results are displayed)   EKG  EKG Interpretation  Date/Time:  Friday February 08 2017 12:04:03 EST Ventricular Rate:  63 PR Interval:  204 QRS Duration: 82 QT Interval:  474 QTC Calculation: 485 R Axis:   14 Text Interpretation:  Normal sinus rhythm Normal ECG When compared with ECG of 01/31/2017 QT has shortened Otherwise no significant change Confirmed by Francine Graven [phone]) on 02/08/2017 1:28:47 PM       Radiology   Procedures Procedures (including critical care time)  Medications Ordered in  ED Medications - No data to display   Initial Impression / Assessment and Plan / ED Course  I have reviewed the triage vital signs and the nursing notes.  Pertinent labs & imaging results that were available during my care of the patient were reviewed by me and considered in my medical decision making (see chart for details).  MDM Reviewed: previous chart, nursing note and vitals Reviewed previous: labs and ECG Interpretation: labs, ECG, x-ray and CT scan    Results for orders placed or performed during the hospital encounter of  02/08/17  Urinalysis, Routine w reflex microscopic  Result Value Ref Range   Color, Urine YELLOW YELLOW   APPearance HAZY (A) CLEAR   Specific Gravity, Urine 1.010 1.005 - 1.030   pH 6.0 5.0 - 8.0   Glucose, UA 50 (A) NEGATIVE mg/dL   Hgb urine dipstick NEGATIVE NEGATIVE   Bilirubin Urine NEGATIVE NEGATIVE   Ketones, ur NEGATIVE NEGATIVE mg/dL   Protein, ur 100 (A) NEGATIVE mg/dL   Nitrite NEGATIVE NEGATIVE   Leukocytes, UA MODERATE (A) NEGATIVE   RBC / HPF 6-30 0 - 5 RBC/hpf   WBC, UA 6-30 0 - 5 WBC/hpf   Bacteria, UA RARE (A) NONE SEEN   Squamous Epithelial / LPF 6-30 (A) NONE SEEN   Mucus PRESENT   Comprehensive metabolic panel  Result Value Ref Range   Sodium 137 135 - 145 mmol/L   Potassium 3.9 3.5 - 5.1 mmol/L   Chloride 99 (L) 101 - 111 mmol/L   CO2 28 22 - 32 mmol/L   Glucose, Bld 172 (H) 65 - 99 mg/dL   BUN 34 (H) 6 - 20 mg/dL   Creatinine, Ser 1.38 (H) 0.44 - 1.00 mg/dL   Calcium 9.0 8.9 - 10.3 mg/dL   Total Protein 7.1 6.5 - 8.1 g/dL   Albumin 2.9 (L) 3.5 - 5.0 g/dL   AST 72 (H) 15 - 41 U/L   ALT 48 14 - 54 U/L   Alkaline Phosphatase 56 38 - 126 U/L   Total Bilirubin 0.4 0.3 - 1.2 mg/dL   GFR calc non Af Amer 39 (L) >60 mL/min   GFR calc Af Amer 45 (L) >60 mL/min   Anion gap 10 5 - 15  Ethanol  Result Value Ref Range   Alcohol, Ethyl (B) <10 <10 mg/dL  Troponin I  Result Value Ref Range   Troponin I <0.03 <0.03 ng/mL  CBC  with Differential  Result Value Ref Range   WBC 6.9 4.0 - 10.5 K/uL   RBC 4.58 3.87 - 5.11 MIL/uL   Hemoglobin 12.9 12.0 - 15.0 g/dL   HCT 40.9 36.0 - 46.0 %   MCV 89.3 78.0 - 100.0 fL   MCH 28.2 26.0 - 34.0 pg   MCHC 31.5 30.0 - 36.0 g/dL   RDW 12.9 11.5 - 15.5 %   Platelets 237 150 - 400 K/uL   Neutrophils Relative % 33 %   Neutro Abs 2.3 1.7 - 7.7 K/uL   Lymphocytes Relative 56 %   Lymphs Abs 3.9 0.7 - 4.0 K/uL   Monocytes Relative 7 %   Monocytes Absolute 0.5 0.1 - 1.0 K/uL   Eosinophils Relative 4 %   Eosinophils Absolute 0.3 0.0 - 0.7 K/uL   Basophils Relative 0 %   Basophils Absolute 0.0 0.0 - 0.1 K/uL  Urine rapid drug screen (hosp performed)  Result Value Ref Range   Opiates NONE DETECTED NONE DETECTED   Cocaine POSITIVE (A) NONE DETECTED   Benzodiazepines NONE DETECTED NONE DETECTED   Amphetamines NONE DETECTED NONE DETECTED   Tetrahydrocannabinol NONE DETECTED NONE DETECTED   Barbiturates NONE DETECTED NONE DETECTED  CBG monitoring, ED  Result Value Ref Range   Glucose-Capillary 152 (H) 65 - 99 mg/dL  I-Stat CG4 Lactic Acid, ED  Result Value Ref Range   Lactic Acid, Venous 1.20 0.5 - 1.9 mmol/L  I-Stat CG4 Lactic Acid, ED  Result Value Ref Range   Lactic Acid, Venous 1.40 0.5 - 1.9 mmol/L   Dg Chest 2 View Result  Date: 02/08/2017 CLINICAL DATA:  Onset of weakness this morning. EXAM: CHEST  2 VIEW COMPARISON:  CT chest 11/19/2016. Single-view of the chest 11/18/2016. FINDINGS: The lungs are clear. Heart size is normal. No pneumothorax or pleural effusion. No acute bony abnormality. IMPRESSION: Negative chest. Electronically Signed   By: Inge Rise M.D.   On: 02/08/2017 13:47   Ct Head Wo Contrast Result Date: 02/08/2017 CLINICAL DATA:  67 year old female with generalized weakness today. EXAM: CT HEAD WITHOUT CONTRAST TECHNIQUE: Contiguous axial images were obtained from the base of the skull through the vertex without intravenous contrast. COMPARISON:   01/27/2016 CT FINDINGS: Brain: A new left caudate infarct is age indeterminate but may be acute-subacute. No evidence of hemorrhage, hydrocephalus, extra-axial collection or mass lesion/mass effect. Moderate chronic small-vessel white matter ischemic changes are again noted. Vascular: Atherosclerotic calcifications noted. Skull: Normal. Negative for fracture or focal lesion. Sinuses/Orbits: No acute finding. Other: None. IMPRESSION: 1. New left caudate infarct-age indeterminate but may be acute- subacute. No hemorrhage. 2. Chronic small-vessel white matter ischemic changes. Electronically Signed   By: Margarette Canada M.D.   On: 02/08/2017 13:58   Mr Brain Wo Contrast (neuro Protocol) Result Date: 02/08/2017 CLINICAL DATA:  Generalized weakness.  Abnormal CT head EXAM: MRI HEAD WITHOUT CONTRAST MRA HEAD WITHOUT CONTRAST TECHNIQUE: Multiplanar, multiecho pulse sequences of the brain and surrounding structures were obtained without intravenous contrast. Angiographic images of the head were obtained using MRA technique without contrast. COMPARISON:  CT head 02/08/2017 FINDINGS: MRI HEAD FINDINGS Brain: Negative for acute infarct. Extensive chronic microvascular ischemic change throughout the cerebral white matter. Chronic lacunar infarction head of the caudate on the left accounting for the CT finding. Chronic microvascular ischemic change in the pons. Negative for hemorrhage, mass, or fluid collection. Mild to moderate atrophy. Negative for hydrocephalus. Vascular: Negative normal arterial flow voids Skull and upper cervical spine: Negative Sinuses/Orbits: Paranasal sinuses clear.  Bilateral cataract removal Other: None MRA HEAD FINDINGS Left vertebral artery dominant and patent to the basilar. PICA patent on the left. Non dominant right vertebral artery with moderate stenosis proximal to right PICA. Right PICA is patent. Severe stenosis distal right vertebral artery. Basilar patent. Fetal origin right posterior  cerebral artery with moderate stenosis distally. Left posterior cerebral artery occluded proximally. Superior cerebellar arteries patent bilaterally. Cavernous carotid patent bilaterally. Mild stenosis right M1 segment. Mild stenosis right M2 segments. Hypoplastic right A1 segment. Right anterior cerebral artery supplied from the left. Atherosclerotic irregularity and mild stenosis left M1 segment. Moderate stenosis in the posterior division of the left middle cerebral artery. Moderate stenosis left pericallosal artery. Negative for cerebral aneurysm. IMPRESSION: Atrophy and extensive chronic ischemic changes as above. 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 Electronically Signed   By: Franchot Gallo M.D.   On: 02/08/2017 16:07   Mr Jodene Nam Head (cerebral Arteries) Result Date: 02/08/2017 CLINICAL DATA:  Generalized weakness.  Abnormal CT head EXAM: MRI HEAD WITHOUT CONTRAST MRA HEAD WITHOUT CONTRAST TECHNIQUE: Multiplanar, multiecho pulse sequences of the brain and surrounding structures were obtained without intravenous contrast. Angiographic images of the head were obtained using MRA technique without contrast. COMPARISON:  CT head 02/08/2017 FINDINGS: MRI HEAD FINDINGS Brain: Negative for acute infarct. Extensive chronic microvascular ischemic change throughout the cerebral white matter. Chronic lacunar infarction head of the caudate on the left accounting for the CT finding. Chronic microvascular ischemic change in the pons. Negative for hemorrhage, mass, or fluid collection. Mild to  moderate atrophy. Negative for hydrocephalus. Vascular: Negative normal arterial flow voids Skull and upper cervical spine: Negative Sinuses/Orbits: Paranasal sinuses clear.  Bilateral cataract removal Other: None MRA HEAD FINDINGS Left vertebral artery dominant and patent to the basilar. PICA patent on the left. Non dominant right vertebral artery with  moderate stenosis proximal to right PICA. Right PICA is patent. Severe stenosis distal right vertebral artery. Basilar patent. Fetal origin right posterior cerebral artery with moderate stenosis distally. Left posterior cerebral artery occluded proximally. Superior cerebellar arteries patent bilaterally. Cavernous carotid patent bilaterally. Mild stenosis right M1 segment. Mild stenosis right M2 segments. Hypoplastic right A1 segment. Right anterior cerebral artery supplied from the left. Atherosclerotic irregularity and mild stenosis left M1 segment. Moderate stenosis in the posterior division of the left middle cerebral artery. Moderate stenosis left pericallosal artery. Negative for cerebral aneurysm. IMPRESSION: Atrophy and extensive chronic ischemic changes as above. 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 Electronically Signed   By: Franchot Gallo M.D.   On: 02/08/2017 16:07    1645:  CT-H questioned new stroke, but MRI does not bear this out. Pt is not orthostatic on VS, has ambulated with steady gait. No clear indication for admission at this time. T/C to TeleNeuro MD, case discussed, including:  HPI, pertinent PM/SHx, VS/PE, dx testing, ED course and treatment:  Agrees with ED workup and no clear indication for admission, stroke is old and without surrounding concerning findings, pt will need to start ASA 84m daily and f/u with Neuro MD and PMD as outpatient.   1700:  Pt eloped before being given d/c instructions.     Final Clinical Impressions(s) / ED Diagnoses   Final diagnoses:  None    ED Discharge Orders    None        MFrancine Graven DO 02/10/17 1356

## 2017-02-08 NOTE — Discharge Instructions (Signed)
Take your usual prescriptions as previously directly. Start taking an aspirin 62m by mouth daily. Try to stop using cocaine.  Call your regular medical doctor on Monday to schedule a follow up appointment within the next 3 days. Call the Neurologist on Monday to schedule a follow up appointment within the next week.  Return to the Emergency Department immediately sooner if worsening.

## 2017-02-08 NOTE — ED Triage Notes (Signed)
Pt reports weakness that started approx 30 minutes ago. Patient states she feels weak all over. Ambulatory to Triage.

## 2017-02-10 LAB — URINE CULTURE

## 2017-05-20 NOTE — Progress Notes (Deleted)
Cardiology Office Note  Date: 05/20/2017   ID: Cassandra Jordan, DOB 06/24/1950, MRN [id number]  PCP: [facility]  Primary Cardiologist: Rozann Lesches, MD   No chief complaint on file.   History of Present Illness: Cassandra Jordan is a 67 y.o. female last seen in October 2017.  We discussed obtaining follow-up LFTs and TSH.  Past Medical History:  Diagnosis Date  . Alcohol abuse   . Arthritis   . Asthma   . Cocaine abuse ([place])   . Essential hypertension   . GERD (gastroesophageal reflux disease)   . Hepatitis C   . Hyperlipidemia   . Lichen sclerosus et atrophicus 10/13/2014  . Neuropathic pain   . Noncompliance   . PSVT (paroxysmal supraventricular tachycardia) ([place])   . Rash   . Renal carcinoma ([place])   . Type 2 diabetes mellitus ([place])   . Vaginal irritation 10/01/2014  . Vaginal itching 10/01/2014  . Yeast infection of the vagina 10/01/2014    Past Surgical History:  Procedure Laterality Date  . ABDOMINAL HYSTERECTOMY    . COLONOSCOPY N/A 07/08/2013   RMR: Multiple colonic polyps treated [name] as described above. Inadequate prepartation comprimised examination there colonic diverticulosis. tubular and tubulovillous adenomas. next TCS 12/2013  . COLONOSCOPY N/A 02/09/2015   RMR: Colonic polyp removed as described above. Tubular adenoma. Next colonoscopy January 2022.  . ESOPHAGOGASTRODUODENOSCOPY N/A 07/08/2013   RMR: Schatzki's ring ; small hiatal hernia-status post passage of a Maloney dilator  . MALONEY DILATION N/A 07/08/2013   Procedure: [name] DILATION;  Surgeon: [name], MD;  Location: [facility];  Service: Endoscopy;  Laterality: N/A;  . ROBOT ASSISTED LAPAROSCOPIC NEPHRECTOMY Right 10/14/2013   Procedure: ROBOTIC ASSISTED LAPAROSCOPIC RADICAL NEPHRECTOMY;  Surgeon: [name], MD;  Location: [facility];  Service: Urology;  Laterality: Right;  . SAVORY DILATION N/A 07/08/2013   Procedure: SAVORY DILATION;  Surgeon: [name], MD;  Location: [facility];  Service: Endoscopy;  Laterality: N/A;  . TONSILLECTOMY    . Wart removal      Current Outpatient Medications  Medication Sig Dispense Refill  . amiodarone (PACERONE) 100 MG tablet TAKE ONE TABLET BY MOUTH ONCE DAILY (Patient taking differently: Take 100 mg by mouth daily. TAKE ONE TABLET BY MOUTH ONCE DAILY) 90 tablet 0  . amLODipine (NORVASC) 10 MG tablet Take 1 tablet (10 mg total) by mouth daily. 30 tablet 1  . carvedilol (COREG) 3.125 MG tablet Take 1 tablet (3.125 mg total) by mouth 2 (two) times daily with a meal. 60 tablet 1  . cloNIDine (CATAPRES) 0.2 MG tablet Take 0.2 mg by mouth 2 (two) times daily.    Marland Kitchen gabapentin (NEURONTIN) 300 MG capsule Take 1 capsule (300 mg total) by mouth 2 (two) times daily. (Patient taking differently: Take 600 mg by mouth 3 (three) times daily. ) 20 capsule 0  . HYDROCHLOROTHIAZIDE PO Take 12.5 mg by mouth daily. tablet    . insulin detemir (LEVEMIR) 100 UNIT/ML injection Inject 3 Units into the skin 3 (three) times daily.     Marland Kitchen lisinopril-hydrochlorothiazide (PRINZIDE,ZESTORETIC) 20-25 MG tablet Take 1 tablet by mouth 2 (two) times daily.    . pantoprazole (PROTONIX) 40 MG tablet Take 1 tablet (40 mg total) by mouth daily. Take 30 minutes before breakfast 90 tablet 3   No current facility-administered medications for this visit.    Allergies:  Sulfa antibiotics   Social History: The patient  reports that  she has been smoking cigarettes.  She has a 4.00 pack-year smoking history. She has never used smokeless tobacco. She reports that she has current or past drug history. Drugs: Marijuana and Cocaine. She reports that she does not drink alcohol.   Family History: The patient's family history includes Cancer in her maternal grandfather and mother; Colon cancer (age of onset: 86) in her sister; Diabetes in her paternal grandmother; Heart attack in her father; Hypertension in her daughter.   ROS:  Please see the  history of present illness. Otherwise, complete review of systems is positive for {NONE DEFAULTED:18576::"none"}.  All other systems are reviewed and negative.   Physical Exam: VS:  There were no vitals taken for this visit., BMI There is no height or weight on file to calculate BMI.  Wt Readings from Last 3 Encounters:  02/08/17 138 lb (62.6 kg)  01/31/17 137 lb 8 oz (62.4 kg)  11/18/16 140 lb 11.2 oz (63.8 kg)    General: Patient appears comfortable at rest. HEENT: Conjunctiva and lids normal, oropharynx clear with moist mucosa. Neck: Supple, no elevated JVP or carotid bruits, no thyromegaly. Lungs: Clear to auscultation, nonlabored breathing at rest. Cardiac: Regular rate and rhythm, no S3 or significant systolic murmur, no pericardial rub. Abdomen: Soft, nontender, no hepatomegaly, bowel sounds present, no guarding or rebound. Extremities: No pitting edema, distal pulses 2+. Skin: Warm and dry. Musculoskeletal: No kyphosis. Neuropsychiatric: Alert and oriented x3, affect grossly appropriate.  ECG: I personally reviewed the tracing from 02/11/2017 which showed normal sinus rhythm.  Recent Labwork: 11/18/2016: Magnesium 2.4 02/08/2017: ALT 48; AST 72; BUN 34; Creatinine, Ser 1.38; Hemoglobin 12.9; Platelets 237; Potassium 3.9; Sodium 137   Other Studies Reviewed Today:  Echocardiogram 10/15/2013: Study Conclusions  - Left ventricle: The cavity size was normal. There was moderate concentric hypertrophy. Systolic function was vigorous. The estimated ejection fraction was in the range of 65% to 70%. There was dynamic obstruction at restin the mid cavity, with a peak velocity of 3 cm/sec and a peak gradient of 36 mm Hg. Wall motion was normal; there were no regional wall motion abnormalities. Doppler parameters are consistent with abnormal left ventricular relaxation (grade 1 diastolic dysfunction).  Chest x-ray 02/08/2017: FINDINGS: The lungs are clear. Heart size  is normal. No pneumothorax or pleural effusion. No acute bony abnormality.  IMPRESSION: Negative chest.  Assessment and Plan:   Current medicines were reviewed with the patient today.  No orders of the defined types were placed in this encounter.   Disposition:  Signed, Satira Sark, MD, [facility] 05/20/2017 1:38 PM    [facility] [address], [place], [place] [address] Phone: [phone]; Fax: [phone]

## 2017-05-21 ENCOUNTER — Emergency Department (HOSPITAL_COMMUNITY): Payer: Medicare HMO

## 2017-05-21 ENCOUNTER — Encounter (HOSPITAL_COMMUNITY): Payer: Self-pay | Admitting: *Deleted

## 2017-05-21 ENCOUNTER — Ambulatory Visit: Payer: Medicare Other | Admitting: Cardiology

## 2017-05-21 ENCOUNTER — Inpatient Hospital Stay (HOSPITAL_COMMUNITY)
Admission: EM | Admit: 2017-05-21 | Discharge: 2017-05-23 | DRG: 065 | Disposition: A | Discharge status: Home Health | Payer: Medicare HMO | Attending: Internal Medicine | Admitting: Internal Medicine

## 2017-05-21 ENCOUNTER — Other Ambulatory Visit: Payer: Self-pay

## 2017-05-21 DIAGNOSIS — Z800 Family history of malignant neoplasm of digestive organs: Secondary | ICD-10-CM

## 2017-05-21 DIAGNOSIS — K7581 Nonalcoholic steatohepatitis (NASH): Secondary | ICD-10-CM | POA: Diagnosis present

## 2017-05-21 DIAGNOSIS — Z86010 Personal history of colonic polyps: Secondary | ICD-10-CM | POA: Diagnosis not present

## 2017-05-21 DIAGNOSIS — E876 Hypokalemia: Secondary | ICD-10-CM | POA: Diagnosis present

## 2017-05-21 DIAGNOSIS — E119 Type 2 diabetes mellitus without complications: Secondary | ICD-10-CM

## 2017-05-21 DIAGNOSIS — Z9089 Acquired absence of other organs: Secondary | ICD-10-CM

## 2017-05-21 DIAGNOSIS — N179 Acute kidney failure, unspecified: Secondary | ICD-10-CM | POA: Diagnosis present

## 2017-05-21 DIAGNOSIS — N183 Chronic kidney disease, stage 3 (moderate): Secondary | ICD-10-CM | POA: Diagnosis present

## 2017-05-21 DIAGNOSIS — J45909 Unspecified asthma, uncomplicated: Secondary | ICD-10-CM | POA: Diagnosis present

## 2017-05-21 DIAGNOSIS — Z794 Long term (current) use of insulin: Secondary | ICD-10-CM

## 2017-05-21 DIAGNOSIS — I471 Supraventricular tachycardia: Secondary | ICD-10-CM | POA: Diagnosis present

## 2017-05-21 DIAGNOSIS — Z833 Family history of diabetes mellitus: Secondary | ICD-10-CM

## 2017-05-21 DIAGNOSIS — I129 Hypertensive chronic kidney disease with stage 1 through stage 4 chronic kidney disease, or unspecified chronic kidney disease: Secondary | ICD-10-CM | POA: Diagnosis present

## 2017-05-21 DIAGNOSIS — Z79899 Other long term (current) drug therapy: Secondary | ICD-10-CM | POA: Diagnosis not present

## 2017-05-21 DIAGNOSIS — I10 Essential (primary) hypertension: Secondary | ICD-10-CM | POA: Diagnosis present

## 2017-05-21 DIAGNOSIS — Z882 Allergy status to sulfonamides status: Secondary | ICD-10-CM

## 2017-05-21 DIAGNOSIS — Z8249 Family history of ischemic heart disease and other diseases of the circulatory system: Secondary | ICD-10-CM

## 2017-05-21 DIAGNOSIS — M1990 Unspecified osteoarthritis, unspecified site: Secondary | ICD-10-CM | POA: Diagnosis present

## 2017-05-21 DIAGNOSIS — E785 Hyperlipidemia, unspecified: Secondary | ICD-10-CM | POA: Diagnosis present

## 2017-05-21 DIAGNOSIS — I639 Cerebral infarction, unspecified: Secondary | ICD-10-CM | POA: Diagnosis present

## 2017-05-21 DIAGNOSIS — R29707 NIHSS score 7: Secondary | ICD-10-CM | POA: Diagnosis present

## 2017-05-21 DIAGNOSIS — F1490 Cocaine use, unspecified, uncomplicated: Secondary | ICD-10-CM | POA: Diagnosis present

## 2017-05-21 DIAGNOSIS — I340 Nonrheumatic mitral (valve) insufficiency: Secondary | ICD-10-CM | POA: Diagnosis not present

## 2017-05-21 DIAGNOSIS — R270 Ataxia, unspecified: Secondary | ICD-10-CM | POA: Diagnosis not present

## 2017-05-21 DIAGNOSIS — Z90710 Acquired absence of both cervix and uterus: Secondary | ICD-10-CM

## 2017-05-21 DIAGNOSIS — Z85528 Personal history of other malignant neoplasm of kidney: Secondary | ICD-10-CM

## 2017-05-21 DIAGNOSIS — Z905 Acquired absence of kidney: Secondary | ICD-10-CM

## 2017-05-21 DIAGNOSIS — B182 Chronic viral hepatitis C: Secondary | ICD-10-CM | POA: Diagnosis present

## 2017-05-21 DIAGNOSIS — Z720 Tobacco use: Secondary | ICD-10-CM | POA: Diagnosis present

## 2017-05-21 DIAGNOSIS — E1122 Type 2 diabetes mellitus with diabetic chronic kidney disease: Secondary | ICD-10-CM | POA: Diagnosis present

## 2017-05-21 DIAGNOSIS — F17210 Nicotine dependence, cigarettes, uncomplicated: Secondary | ICD-10-CM | POA: Diagnosis present

## 2017-05-21 DIAGNOSIS — K219 Gastro-esophageal reflux disease without esophagitis: Secondary | ICD-10-CM | POA: Diagnosis present

## 2017-05-21 LAB — CBC
HEMATOCRIT: 37.8 % (ref 36.0–46.0)
HEMOGLOBIN: 12 g/dL (ref 12.0–15.0)
MCH: 27.9 pg (ref 26.0–34.0)
MCHC: 31.7 g/dL (ref 30.0–36.0)
MCV: 87.9 fL (ref 78.0–100.0)
Platelets: 277 10*3/uL (ref 150–400)
RBC: 4.3 MIL/uL (ref 3.87–5.11)
RDW: 12.8 % (ref 11.5–15.5)
WBC: 8.3 10*3/uL (ref 4.0–10.5)

## 2017-05-21 LAB — I-STAT TROPONIN, ED: Troponin i, poc: 0 ng/mL (ref 0.00–0.08)

## 2017-05-21 LAB — BASIC METABOLIC PANEL
Anion gap: 18 — ABNORMAL HIGH (ref 5–15)
BUN: 33 mg/dL — AB (ref 6–20)
CHLORIDE: 97 mmol/L — AB (ref 101–111)
CO2: 24 mmol/L (ref 22–32)
Calcium: 9.1 mg/dL (ref 8.9–10.3)
Creatinine, Ser: 2 mg/dL — ABNORMAL HIGH (ref 0.44–1.00)
GFR calc Af Amer: 29 mL/min — ABNORMAL LOW (ref >60)
GFR calc non Af Amer: 25 mL/min — ABNORMAL LOW (ref >60)
GLUCOSE: 115 mg/dL — AB (ref 65–99)
Potassium: 3.3 mmol/L — ABNORMAL LOW (ref 3.5–5.1)
Sodium: 139 mmol/L (ref 135–145)

## 2017-05-21 LAB — I-STAT CHEM 8, ED
BUN: 29 mg/dL — AB (ref 6–20)
CALCIUM ION: 1.1 mmol/L — AB (ref 1.15–1.40)
CHLORIDE: 98 mmol/L — AB (ref 101–111)
Creatinine, Ser: 1.9 mg/dL — ABNORMAL HIGH (ref 0.44–1.00)
Glucose, Bld: 113 mg/dL — ABNORMAL HIGH (ref 65–99)
HEMATOCRIT: 39 % (ref 36.0–46.0)
Hemoglobin: 13.3 g/dL (ref 12.0–15.0)
Potassium: 3.2 mmol/L — ABNORMAL LOW (ref 3.5–5.1)
SODIUM: 138 mmol/L (ref 135–145)
TCO2: 28 mmol/L (ref 22–32)

## 2017-05-21 LAB — DIFFERENTIAL
BASOS ABS: 0 10*3/uL (ref 0.0–0.1)
BASOS PCT: 0 %
EOS ABS: 0.2 10*3/uL (ref 0.0–0.7)
EOS PCT: 2 %
LYMPHS ABS: 4.5 10*3/uL — AB (ref 0.7–4.0)
Lymphocytes Relative: 54 %
MONOS PCT: 9 %
Monocytes Absolute: 0.8 10*3/uL (ref 0.1–1.0)
NEUTROS PCT: 35 %
Neutro Abs: 2.9 10*3/uL (ref 1.7–7.7)

## 2017-05-21 LAB — APTT: aPTT: 31 seconds (ref 24–36)

## 2017-05-21 LAB — ETHANOL

## 2017-05-21 LAB — CBG MONITORING, ED: GLUCOSE-CAPILLARY: 118 mg/dL — AB (ref 65–99)

## 2017-05-21 LAB — PROTIME-INR
INR: 0.97
PROTHROMBIN TIME: 12.8 s (ref 11.4–15.2)

## 2017-05-21 MED ORDER — IOPAMIDOL (ISOVUE-370) INJECTION 76%
150.0000 mL | Freq: Once | INTRAVENOUS | Status: AC | PRN
  Administered 2017-05-21: 120 mL via INTRAVENOUS
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
## 2017-05-21 NOTE — ED Triage Notes (Signed)
Pt brought in by [facility] for c/o generalized weakness that started about an hour ago; pt states she was trying to fix her something to eat and she could not focus on what she was doing; pt states she felt dizzy; pt states both her ears have a fullness feeling;  pt also c/o  cbg 125

## 2017-05-21 NOTE — H&P (Signed)
History and Physical    Cassandra Jordan:[id number] DOB: 1950-11-16 DOA: 05/21/2017  PCP: [facility]   Patient coming from: Home.  I have personally briefly reviewed patient's old medical records in [place]  Chief Complaint: Generalized weakness.  HPI: Cassandra Jordan is a 67 y.o. female with medical history significant of alcohol abuse, arthritis, asthma, history of cocaine abuse, essential hypertension, GERD, hepatitis C, hyperlipidemia, neuropathic pain history, history of noncompliance, paroxysmal SVT, renal cell carcinoma, type 2 diabetes who is coming due to generalized weakness for about an hour prior to arrival to the ED.  Per patient, she was trying to fix herself something to eat around 1700, when she noticed that she was unable to focus.  Her vision got very blurred, she was unable to see and she got very dizzy.  She accidentally mildly burned her hand on the stove.  She called EMS and states that the symptoms lasted several minutes, but by the time the EMS crew arrived her symptoms had subsided significantly.  She denies fever, chills, sore throat, dyspnea, chest pain, palpitations, diaphoresis, PND, orthopnea or pitting edema of the lower extremities.  No abdominal pain, nausea, emesis, diarrhea or constipation.  No melena or hematochezia.  Denies dysuria, frequency or hematuria.  Denies heat or cold intolerance.  No polyuria, polyphagia or polydipsia.  ED Course: Initial vital signs temperature 37 C (98.6 F), pulse 60, respiration 18, blood pressure 102/62 mmHg and O2 sat 98% on room air.  Dr. Sabra Heck discussed the case with tele-neurology and radiology.  No TPA given.  Please see his notes.  White count is 8.3 with a normal differential.  Hemoglobin 12.0 g/dL and platelets 277.  PT, INR and PTT within normal limits.  I-STAT troponin was normal.  Sodium 139, potassium 3.3, chloride 97 and CO2 24 mmol/L.  Glucose 115, BUN 33, creatinine 2.0,  calcium 9.1, magnesium 1.9 and phosphorus 4.9 mg/dL.  Imaging: CT angiogram of head and neck shows an area of acute ischemia within the left posterior cerebral artery territory without core infarct by perfusion criteria.  There was no hemorrhage.  There is severe diffuse narrowing of the left posterior cerebral artery with possible source of mental occlusion in the P2 segment.  Also severe short segment stenosis of the proximal left MCA M1 segment, which is otherwise normal.  There aspirate symmetry 80% stenosis of the right internal carotid artery secondary to calcific atherosclerosis.  There is moderate to severe stenosis of both internal carotid arteries at the skull base.  Please see images and full radiology report for further detail.  Review of Systems: As per HPI otherwise 10 point review of systems negative.    Past Medical History:  Diagnosis Date  . Alcohol abuse   . Arthritis   . Asthma   . Cocaine abuse ([place])   . Essential hypertension   . GERD (gastroesophageal reflux disease)   . Hepatitis C   . Hyperlipidemia   . Lichen sclerosus et atrophicus 10/13/2014  . Neuropathic pain   . Noncompliance   . PSVT (paroxysmal supraventricular tachycardia) ([place])   . Rash   . Renal carcinoma ([place])   . Type 2 diabetes mellitus ([place])   . Vaginal irritation 10/01/2014  . Vaginal itching 10/01/2014  . Yeast infection of the vagina 10/01/2014    Past Surgical History:  Procedure Laterality Date  . ABDOMINAL HYSTERECTOMY    . COLONOSCOPY N/A 07/08/2013   RMR: Multiple colonic polyps treated /  removed as described above. Inadequate prepartation comprimised examination there colonic diverticulosis. tubular and tubulovillous adenomas. next TCS 12/2013  . COLONOSCOPY N/A 02/09/2015   RMR: Colonic polyp removed as described above. Tubular adenoma. Next colonoscopy January 2022.  . ESOPHAGOGASTRODUODENOSCOPY N/A 07/08/2013   RMR: Schatzki's ring ; small hiatal hernia-status post passage of a Maloney  dilator  . MALONEY DILATION N/A 07/08/2013   Procedure: [name] DILATION;  Surgeon: [name], MD;  Location: [facility];  Service: Endoscopy;  Laterality: N/A;  . ROBOT ASSISTED LAPAROSCOPIC NEPHRECTOMY Right 10/14/2013   Procedure: ROBOTIC ASSISTED LAPAROSCOPIC RADICAL NEPHRECTOMY;  Surgeon: [name], MD;  Location: [facility] ORS;  Service: Urology;  Laterality: Right;  . SAVORY DILATION N/A 07/08/2013   Procedure: SAVORY DILATION;  Surgeon: [name], MD;  Location: [facility];  Service: Endoscopy;  Laterality: N/A;  . TONSILLECTOMY    . Wart removal       reports that she has been smoking cigarettes.  She has a 4.00 pack-year smoking history. She has never used smokeless tobacco. She reports that she has current or past drug history. Drugs: Marijuana and Cocaine. She reports that she does not drink alcohol.  Allergies  Allergen Reactions  . Sulfa Antibiotics Itching and Other (See Comments)    burning    Family History  Problem Relation Age of Onset  . Colon cancer Sister 51  . Cancer Mother   . Heart attack Father   . Hypertension Daughter   . Cancer Maternal Grandfather   . Diabetes Paternal Grandmother     Prior to Admission medications   Medication Sig Start Date End Date Taking? Authorizing Provider  amiodarone (PACERONE) 100 MG tablet TAKE ONE TABLET BY MOUTH ONCE DAILY Patient taking differently: Take 100 mg by mouth daily. TAKE ONE TABLET BY MOUTH ONCE DAILY 01/14/17   Satira Sark, MD  amLODipine (NORVASC) 10 MG tablet Take 1 tablet (10 mg total) by mouth daily. 11/04/16   Orson Eva, MD  carvedilol (COREG) 3.125 MG tablet Take 1 tablet (3.125 mg total) by mouth 2 (two) times daily with a meal. 11/20/16   Tat, Shanon Brow, MD  cloNIDine (CATAPRES) 0.2 MG tablet Take 0.2 mg by mouth 2 (two) times daily.    [provider]  gabapentin (NEURONTIN) 300 MG capsule Take 1 capsule (300 mg total) by mouth 2 (two) times daily. Patient taking differently:  Take 600 mg by mouth 3 (three) times daily.  11/06/14   Francine Graven, DO  HYDROCHLOROTHIAZIDE PO Take 12.5 mg by mouth daily. tablet    [provider]  insulin detemir (LEVEMIR) 100 UNIT/ML injection Inject 3 Units into the skin 3 (three) times daily.     [provider]  lisinopril-hydrochlorothiazide (PRINZIDE,ZESTORETIC) 20-25 MG tablet Take 1 tablet by mouth 2 (two) times daily.    [provider]  pantoprazole (PROTONIX) 40 MG tablet Take 1 tablet (40 mg total) by mouth daily. Take 30 minutes before breakfast 03/06/16   Annitta Needs, NP    Physical Exam: Vitals:   05/21/17 2230 05/21/17 2300 05/21/17 2307 05/21/17 2330  BP: 112/62 121/65  (!) 115/57  Pulse:  (!) 58  60  Resp: 13 15  11   Temp:      TempSrc:      SpO2:  91%  97%  Weight:   63.5 kg (140 lb)     Constitutional: NAD, calm, comfortable Eyes: PERRL, lids and conjunctivae normal ENMT: Mucous membranes are moist. Posterior pharynx clear  of any exudate or lesions. Neck: normal, supple, no masses, no thyromegaly Respiratory: Decreased breath sounds on bases, otherwise clear to auscultation bilaterally, no wheezing, no crackles. Normal respiratory effort. No accessory muscle use.  Cardiovascular: Regular rate and rhythm, positive 2/6 SEM, no rubs / gallops. No extremity edema. 2+ pedal pulses. No carotid bruits.  Abdomen: Bowel sounds positive. Soft, no tenderness, no masses palpated. No hepatosplenomegaly.  Musculoskeletal: no clubbing / cyanosis. Good ROM, no contractures. Normal muscle tone.  Skin: no rashes, lesions, ulcers  Neurologic: CN 2-12 grossly intact. Sensation intact, DTR normal. Strength 5/5 in all 4.  Nose to finger performed with some difficulty.  Gait was briefly examined, the patient does not seem to be as ataxic as earlier. Psychiatric: Normal judgment and insight. Alert and oriented x 3. Normal mood.    Labs on Admission: I have personally reviewed following labs and  imaging studies  CBC: Recent Labs  Lab 05/21/17 2217 05/21/17 2239  WBC 8.3  --   NEUTROABS 2.9  --   HGB 12.0 13.3  HCT 37.8 39.0  MCV 87.9  --   PLT 277  --    Basic Metabolic Panel: Recent Labs  Lab 05/21/17 2239  NA 138  K 3.2*  CL 98*  GLUCOSE 113*  BUN 29*  CREATININE 1.90*   GFR: Estimated Creatinine Clearance: 24.2 mL/min (A) (by C-G formula based on SCr of 1.9 mg/dL (H)). Liver Function Tests: No results for input(s): AST, ALT, ALKPHOS, BILITOT, PROT, ALBUMIN in the last 168 hours. No results for input(s): LIPASE, AMYLASE in the last 168 hours. No results for input(s): AMMONIA in the last 168 hours. Coagulation Profile: Recent Labs  Lab 05/21/17 2217  INR 0.97   Cardiac Enzymes: No results for input(s): CKTOTAL, CKMB, CKMBINDEX, TROPONINI in the last 168 hours. BNP (last 3 results) No results for input(s): PROBNP in the last 8760 hours. HbA1C: No results for input(s): HGBA1C in the last 72 hours. CBG: Recent Labs  Lab 05/21/17 2249  GLUCAP 118*   Lipid Profile: No results for input(s): CHOL, HDL, LDLCALC, TRIG, CHOLHDL, LDLDIRECT in the last 72 hours. Thyroid Function Tests: No results for input(s): TSH, T4TOTAL, FREET4, T3FREE, THYROIDAB in the last 72 hours. Anemia Panel: No results for input(s): VITAMINB12, FOLATE, FERRITIN, TIBC, IRON, RETICCTPCT in the last 72 hours. Urine analysis:    Component Value Date/Time   COLORURINE YELLOW 02/08/2017 1600   APPEARANCEUR HAZY (A) 02/08/2017 1600   LABSPEC 1.010 02/08/2017 1600   PHURINE 6.0 02/08/2017 1600   GLUCOSEU 50 (A) 02/08/2017 1600   HGBUR NEGATIVE 02/08/2017 1600   BILIRUBINUR NEGATIVE 02/08/2017 1600   KETONESUR NEGATIVE 02/08/2017 1600   PROTEINUR 100 (A) 02/08/2017 1600   UROBILINOGEN 0.2 06/30/2014 2000   NITRITE NEGATIVE 02/08/2017 1600   LEUKOCYTESUR MODERATE (A) 02/08/2017 1600    Radiological Exams on Admission: Ct Angio Head W Or Wo Contrast  Result Date:  05/21/2017 CLINICAL DATA:  Left-sided weakness.  Dizziness. EXAM: CT ANGIOGRAPHY HEAD AND NECK CT PERFUSION BRAIN TECHNIQUE: Multidetector CT imaging of the head and neck was performed using the standard protocol during bolus administration of intravenous contrast. Multiplanar CT image reconstructions and MIPs were obtained to evaluate the vascular anatomy. Carotid stenosis measurements (when applicable) are obtained utilizing NASCET criteria, using the distal internal carotid diameter as the denominator. Multiphase CT imaging of the brain was performed following IV bolus contrast injection. Subsequent parametric perfusion maps were calculated using RAPID software. CONTRAST:  178m ISOVUE-370 IOPAMIDOL (ISOVUE-370)  INJECTION 76% COMPARISON:  Head CT 02/08/2017 FINDINGS: CT HEAD FINDINGS Brain: No mass lesion or acute hemorrhage. No focal hypoattenuation of the basal ganglia or cortex to indicate infarcted tissue. There is periventricular hypoattenuation compatible with chronic microvascular disease. There is generalized atrophy without lobar predilection. Unchanged appearance of old left caudate head lacunar infarct. Vascular: No hyperdense vessel. Atherosclerotic calcification of the internal carotid arteries at the skull base. Skull: Normal visualized skull base, calvarium and extracranial soft tissues. Sinuses/Orbits: No sinus fluid levels or advanced mucosal thickening. No mastoid effusion. Normal orbits. ASPECTS ([place] Stroke Program Early CT Score) - Ganglionic level infarction (caudate, lentiform nuclei, internal capsule, insula, M1-M3 cortex): 7 - Supraganglionic infarction (M4-M6 cortex): 3 Total score (0-10 with 10 being normal): 10 CTA NECK FINDINGS AORTIC ARCH: There is no calcific atherosclerosis of the aortic arch. There is no aneurysm, dissection or hemodynamically significant stenosis of the visualized ascending aorta and aortic arch. Conventional 3 vessel aortic branching pattern. The visualized  proximal subclavian arteries are widely patent. RIGHT CAROTID SYSTEM: --Common carotid artery: Widely patent origin without common carotid artery dissection or aneurysm. There is bulky calcification of the carotid bifurcation. --Internal carotid artery: There is approximately 80% stenosis of the proximal internal carotid artery secondary to predominantly calcified plaque. Normal distal ICA. --External carotid artery: No acute abnormality. LEFT CAROTID SYSTEM: --Common carotid artery: Widely patent origin without common carotid artery dissection or aneurysm. --Internal carotid artery:No dissection, occlusion or aneurysm. No hemodynamically significant stenosis. --External carotid artery: No acute abnormality. VERTEBRAL ARTERIES: Left dominant configuration. Both origins are normal. No dissection, occlusion or flow-limiting stenosis to the vertebrobasilar confluence. SKELETON: There is no bony spinal canal stenosis. No lytic or blastic lesion. Extensive dental disease. OTHER NECK: Normal pharynx, larynx and major salivary glands. No cervical lymphadenopathy. Unremarkable thyroid gland. UPPER CHEST: No pneumothorax or pleural effusion. No nodules or masses. CTA HEAD FINDINGS ANTERIOR CIRCULATION: --Intracranial internal carotid arteries: Moderate-to-severe stenosis of the lacerum segments of both internal carotid arteries. Multifocal calcification throughout the cavernous segments. --Anterior cerebral arteries: Normal. Absent right A1 segment, normal variant --Middle cerebral arteries: There is a short segment severe stenosis of the proximal M1 segment of the left MCA, which is otherwise widely patent. Normal right MCA. --Posterior communicating arteries: Present bilaterally. POSTERIOR CIRCULATION: --Basilar artery: Normal. --Posterior cerebral arteries: The right posterior cerebral artery is normal. There is severe narrowing of the entire left posterior cerebral artery with suspected short segment occlusion of the P2  segment. --Superior cerebellar arteries: Normal. --Inferior cerebellar arteries: Normal anterior and posterior inferior cerebellar arteries. VENOUS SINUSES: As permitted by contrast timing, patent. ANATOMIC VARIANTS: None DELAYED PHASE: Not performed. Review of the MIP images confirms the above findings. CT Brain Perfusion Findings: CBF (<30%) Volume: 0 ML Perfusion (Tmax>6.0s) volume: 25m Mismatch Volume: 335mInfarction Location:Left posterior cerebral artery territory IMPRESSION: 1. Area of acute ischemia within the left posterior cerebral artery territory without core infarct by perfusion criteria. No acute hemorrhage. ASPECTS is 10. 2. Severe diffuse narrowing of the left posterior cerebral artery with possible short segment occlusion in the P2 segment. 3. Severe short segment stenosis of the proximal left MCA M1 segment, which is otherwise normal. 4. Approximately 80% stenosis of the proximal right internal carotid artery secondary to calcific atherosclerosis. 5. Moderate-to-severe stenosis of both internal carotid arteries at the skull base. These results were called by telephone at the time of interpretation on 05/21/2017 at 11:41 pm to Dr. BRNoemi Chapel who verbally acknowledged these results. Electronically Signed  By: Ulyses Jarred M.D.   On: 05/21/2017 23:55   Ct Angio Neck W Or Wo Contrast  Result Date: 05/21/2017 CLINICAL DATA:  Left-sided weakness.  Dizziness. EXAM: CT ANGIOGRAPHY HEAD AND NECK CT PERFUSION BRAIN TECHNIQUE: Multidetector CT imaging of the head and neck was performed using the standard protocol during bolus administration of intravenous contrast. Multiplanar CT image reconstructions and MIPs were obtained to evaluate the vascular anatomy. Carotid stenosis measurements (when applicable) are obtained utilizing NASCET criteria, using the distal internal carotid diameter as the denominator. Multiphase CT imaging of the brain was performed following IV bolus contrast injection.  Subsequent parametric perfusion maps were calculated using RAPID software. CONTRAST:  122m ISOVUE-370 IOPAMIDOL (ISOVUE-370) INJECTION 76% COMPARISON:  Head CT 02/08/2017 FINDINGS: CT HEAD FINDINGS Brain: No mass lesion or acute hemorrhage. No focal hypoattenuation of the basal ganglia or cortex to indicate infarcted tissue. There is periventricular hypoattenuation compatible with chronic microvascular disease. There is generalized atrophy without lobar predilection. Unchanged appearance of old left caudate head lacunar infarct. Vascular: No hyperdense vessel. Atherosclerotic calcification of the internal carotid arteries at the skull base. Skull: Normal visualized skull base, calvarium and extracranial soft tissues. Sinuses/Orbits: No sinus fluid levels or advanced mucosal thickening. No mastoid effusion. Normal orbits. ASPECTS ([place] Early CT Score) - Ganglionic level infarction (caudate, lentiform nuclei, internal capsule, insula, M1-M3 cortex): 7 - Supraganglionic infarction (M4-M6 cortex): 3 Total score (0-10 with 10 being normal): 10 CTA NECK FINDINGS AORTIC ARCH: There is no calcific atherosclerosis of the aortic arch. There is no aneurysm, dissection or hemodynamically significant stenosis of the visualized ascending aorta and aortic arch. Conventional 3 vessel aortic branching pattern. The visualized proximal subclavian arteries are widely patent. RIGHT CAROTID SYSTEM: --Common carotid artery: Widely patent origin without common carotid artery dissection or aneurysm. There is bulky calcification of the carotid bifurcation. --Internal carotid artery: There is approximately 80% stenosis of the proximal internal carotid artery secondary to predominantly calcified plaque. Normal distal ICA. --External carotid artery: No acute abnormality. LEFT CAROTID SYSTEM: --Common carotid artery: Widely patent origin without common carotid artery dissection or aneurysm. --Internal carotid artery:No  dissection, occlusion or aneurysm. No hemodynamically significant stenosis. --External carotid artery: No acute abnormality. VERTEBRAL ARTERIES: Left dominant configuration. Both origins are normal. No dissection, occlusion or flow-limiting stenosis to the vertebrobasilar confluence. SKELETON: There is no bony spinal canal stenosis. No lytic or blastic lesion. Extensive dental disease. OTHER NECK: Normal pharynx, larynx and major salivary glands. No cervical lymphadenopathy. Unremarkable thyroid gland. UPPER CHEST: No pneumothorax or pleural effusion. No nodules or masses. CTA HEAD FINDINGS ANTERIOR CIRCULATION: --Intracranial internal carotid arteries: Moderate-to-severe stenosis of the lacerum segments of both internal carotid arteries. Multifocal calcification throughout the cavernous segments. --Anterior cerebral arteries: Normal. Absent right A1 segment, normal variant --Middle cerebral arteries: There is a short segment severe stenosis of the proximal M1 segment of the left MCA, which is otherwise widely patent. Normal right MCA. --Posterior communicating arteries: Present bilaterally. POSTERIOR CIRCULATION: --Basilar artery: Normal. --Posterior cerebral arteries: The right posterior cerebral artery is normal. There is severe narrowing of the entire left posterior cerebral artery with suspected short segment occlusion of the P2 segment. --Superior cerebellar arteries: Normal. --Inferior cerebellar arteries: Normal anterior and posterior inferior cerebellar arteries. VENOUS SINUSES: As permitted by contrast timing, patent. ANATOMIC VARIANTS: None DELAYED PHASE: Not performed. Review of the MIP images confirms the above findings. CT Brain Perfusion Findings: CBF (<30%) Volume: 0 ML Perfusion (Tmax>6.0s) volume: 329mMismatch Volume: 3281mnfarction  Location:Left posterior cerebral artery territory IMPRESSION: 1. Area of acute ischemia within the left posterior cerebral artery territory without core infarct by  perfusion criteria. No acute hemorrhage. ASPECTS is 10. 2. Severe diffuse narrowing of the left posterior cerebral artery with possible short segment occlusion in the P2 segment. 3. Severe short segment stenosis of the proximal left MCA M1 segment, which is otherwise normal. 4. Approximately 80% stenosis of the proximal right internal carotid artery secondary to calcific atherosclerosis. 5. Moderate-to-severe stenosis of both internal carotid arteries at the skull base. These results were called by telephone at the time of interpretation on 05/21/2017 at 11:41 pm to Dr. Noemi Chapel , who verbally acknowledged these results. Electronically Signed   By: Ulyses Jarred M.D.   On: 05/21/2017 23:55   Ct Cerebral Perfusion W Contrast  Result Date: 05/21/2017 CLINICAL DATA:  Left-sided weakness.  Dizziness. EXAM: CT ANGIOGRAPHY HEAD AND NECK CT PERFUSION BRAIN TECHNIQUE: Multidetector CT imaging of the head and neck was performed using the standard protocol during bolus administration of intravenous contrast. Multiplanar CT image reconstructions and MIPs were obtained to evaluate the vascular anatomy. Carotid stenosis measurements (when applicable) are obtained utilizing NASCET criteria, using the distal internal carotid diameter as the denominator. Multiphase CT imaging of the brain was performed following IV bolus contrast injection. Subsequent parametric perfusion maps were calculated using RAPID software. CONTRAST:  141m ISOVUE-370 IOPAMIDOL (ISOVUE-370) INJECTION 76% COMPARISON:  Head CT 02/08/2017 FINDINGS: CT HEAD FINDINGS Brain: No mass lesion or acute hemorrhage. No focal hypoattenuation of the basal ganglia or cortex to indicate infarcted tissue. There is periventricular hypoattenuation compatible with chronic microvascular disease. There is generalized atrophy without lobar predilection. Unchanged appearance of old left caudate head lacunar infarct. Vascular: No hyperdense vessel. Atherosclerotic  calcification of the internal carotid arteries at the skull base. Skull: Normal visualized skull base, calvarium and extracranial soft tissues. Sinuses/Orbits: No sinus fluid levels or advanced mucosal thickening. No mastoid effusion. Normal orbits. ASPECTS ([place] Early CT Score) - Ganglionic level infarction (caudate, lentiform nuclei, internal capsule, insula, M1-M3 cortex): 7 - Supraganglionic infarction (M4-M6 cortex): 3 Total score (0-10 with 10 being normal): 10 CTA NECK FINDINGS AORTIC ARCH: There is no calcific atherosclerosis of the aortic arch. There is no aneurysm, dissection or hemodynamically significant stenosis of the visualized ascending aorta and aortic arch. Conventional 3 vessel aortic branching pattern. The visualized proximal subclavian arteries are widely patent. RIGHT CAROTID SYSTEM: --Common carotid artery: Widely patent origin without common carotid artery dissection or aneurysm. There is bulky calcification of the carotid bifurcation. --Internal carotid artery: There is approximately 80% stenosis of the proximal internal carotid artery secondary to predominantly calcified plaque. Normal distal ICA. --External carotid artery: No acute abnormality. LEFT CAROTID SYSTEM: --Common carotid artery: Widely patent origin without common carotid artery dissection or aneurysm. --Internal carotid artery:No dissection, occlusion or aneurysm. No hemodynamically significant stenosis. --External carotid artery: No acute abnormality. VERTEBRAL ARTERIES: Left dominant configuration. Both origins are normal. No dissection, occlusion or flow-limiting stenosis to the vertebrobasilar confluence. SKELETON: There is no bony spinal canal stenosis. No lytic or blastic lesion. Extensive dental disease. OTHER NECK: Normal pharynx, larynx and major salivary glands. No cervical lymphadenopathy. Unremarkable thyroid gland. UPPER CHEST: No pneumothorax or pleural effusion. No nodules or masses. CTA HEAD  FINDINGS ANTERIOR CIRCULATION: --Intracranial internal carotid arteries: Moderate-to-severe stenosis of the lacerum segments of both internal carotid arteries. Multifocal calcification throughout the cavernous segments. --Anterior cerebral arteries: Normal. Absent right A1 segment, normal variant --Middle  cerebral arteries: There is a short segment severe stenosis of the proximal M1 segment of the left MCA, which is otherwise widely patent. Normal right MCA. --Posterior communicating arteries: Present bilaterally. POSTERIOR CIRCULATION: --Basilar artery: Normal. --Posterior cerebral arteries: The right posterior cerebral artery is normal. There is severe narrowing of the entire left posterior cerebral artery with suspected short segment occlusion of the P2 segment. --Superior cerebellar arteries: Normal. --Inferior cerebellar arteries: Normal anterior and posterior inferior cerebellar arteries. VENOUS SINUSES: As permitted by contrast timing, patent. ANATOMIC VARIANTS: None DELAYED PHASE: Not performed. Review of the MIP images confirms the above findings. CT Brain Perfusion Findings: CBF (<30%) Volume: 0 ML Perfusion (Tmax>6.0s) volume: 41m Mismatch Volume: 357mInfarction Location:Left posterior cerebral artery territory IMPRESSION: 1. Area of acute ischemia within the left posterior cerebral artery territory without core infarct by perfusion criteria. No acute hemorrhage. ASPECTS is 10. 2. Severe diffuse narrowing of the left posterior cerebral artery with possible short segment occlusion in the P2 segment. 3. Severe short segment stenosis of the proximal left MCA M1 segment, which is otherwise normal. 4. Approximately 80% stenosis of the proximal right internal carotid artery secondary to calcific atherosclerosis. 5. Moderate-to-severe stenosis of both internal carotid arteries at the skull base. These results were called by telephone at the time of interpretation on 05/21/2017 at 11:41 pm to Dr. BRNoemi Chapel  who verbally acknowledged these results. Electronically Signed   By: KeUlyses Jarred.D.   On: 05/21/2017 23:55    EKG: Independently reviewed.  Vent. rate 61 BPM PR interval * ms QRS duration 100 ms QT/QTc 487/491 ms P-R-T axes 33 15 63 Sinus rhythm Probable left ventricular hypertrophy Borderline prolonged QT interval  Assessment/Plan Principal Problem:   Acute CVA (cerebrovascular accident) (H[place]allAdmit to telemetry/inpatient. Frequent neuro checks. Swallow screen checked earlier. PT/OT evaluation. Check hemoglobin A1c. Check fasting lipid panel. Check echocardiogram and carotid Doppler. Check MR/MRA of brain/neck. Routine neurology counseling a.m.  Active Problems:   AKI (acute kidney injury) (HCGibslandContinue IV fluids. Monitor intake and output. Follow-up BUN, creatinine and electrolytes in the morning.    Hypokalemia Replacing. Check magnesium level. Follow-up potassium level.    Hyperphosphatemia Continue IV fluids. Follow-up phosphorus level.    Essential hypertension Allow permissive hypertension. Hold antihypertensives for now. Monitor blood pressure, heart rate and renal function.    PSVT (paroxysmal supraventricular tachycardia) (HCC) Continue amiodarone 100 mg p.o. daily. Correct potassium and optimize magnesium. Resume carvedilol and clonidine if heart rate increases.    Type 2 diabetes mellitus (HCC) Last hemoglobin A1c 6.7% on 01/31/2017 Carbohydrate modified diet. Check hemoglobin A1c. Continue Levemir 3 units SQ 3 times daily. CBG monitoring with regular insulin sliding scale while in the hospital.    Tobacco abuse Currently smoking only 1 to 2 cigarettes a day. Advised to cease smoking, particularly due to a planned cholecystectomy in the near future As stated that she does not need nicotine replacement therapy at this time. Nurse to provide tobacco cessation information.    GERD (gastroesophageal reflux disease)  Continue Protonix 40 mg  p.o. daily.    Hepatitis C, chronic (HCC) Check LFTs in a.m.    DVT prophylaxis: Lovenox SQ. Code Status: Full code. Family Communication:  Disposition Plan: Admit for CVA work-up and neurology evaluation. Consults called: Routine neurology consult. Admission status: Inpatient/telemetry.   DaReubin MilanD [facility] Pager [phone] If 7PM-7AM, please contact night-coverage [email or web address] Password [email or web address]  05/21/2017, 11:59 PM

## 2017-05-21 NOTE — ED Provider Notes (Signed)
[facility] EMERGENCY DEPARTMENT Provider Note   CSN: [id number] Arrival date & time: 05/21/17  2145     History   Chief Complaint Chief Complaint  Patient presents with  . Weakness    HPI Cassandra Jordan is a 67 y.o. female.  HPI  The patient is a 67 year old female, she has a known history of alcohol abuse, she has a history of hyperlipidemia, essential hypertension, cocaine abuse, diabetes and a history of renal cell carcinoma.  The patient presents with a chief complaint of ataxia.  According to the paramedics and the patient she came home from her sister's house somewhere around 8:00 in the morning where she had been walking and driving okay, went to bed and woke up sometime around noon to the best of her knowledge.  Since that time she has had difficulty ambulating having to hold onto things in the kitchen, she has also had difficulty doing simple things such as turning on the stove stating that she could not figure out how to do it.  The patient states that she still is having trouble but she was eventually able to get the stove on, she was trying to fry some food and actually burned her fingers because of her coordination problems.  She denies any focal difficulties with vision though she states it sometimes blurry and is not more specific.  She was able to dress herself and denies any focal weakness or numbness of her arms or her legs.  No difficulty with speech.  It is unclear exactly what time this happened but the last time the patient knew that she was normal was when she went to bed at around 8:00 in the morning.  That leaves her 14 hours prior to evaluation with regards to the timing.   Past Medical History:  Diagnosis Date  . Alcohol abuse   . Arthritis   . Asthma   . Cocaine abuse ([place])   . Essential hypertension   . GERD (gastroesophageal reflux disease)   . Hepatitis C   . Hyperlipidemia   . Lichen sclerosus et atrophicus 10/13/2014  . Neuropathic pain   .  Noncompliance   . PSVT (paroxysmal supraventricular tachycardia) ([place])   . Rash   . Renal carcinoma ([place])   . Type 2 diabetes mellitus ([place])   . Vaginal irritation 10/01/2014  . Vaginal itching 10/01/2014  . Yeast infection of the vagina 10/01/2014    Patient Active Problem List   Diagnosis Date Noted  . Abdominal pain 11/18/2016  . Cholelithiasis 11/18/2016  . Uncontrolled type 2 diabetes mellitus with hyperglycemia, with long-term current use of insulin ([place]) 11/18/2016  . CKD (chronic kidney disease), stage III ([place]) 11/18/2016  . Acute pancreatitis   . Ileus ([place])   . Porcelain gallbladder   . Acute renal failure superimposed on stage 3 chronic kidney disease ([place]) 11/04/2016  . Hypokalemia 11/01/2016  . Hypomagnesemia 11/01/2016  . SBO (small bowel obstruction) ([place])   . AKI (acute kidney injury) ([place]) 10/30/2016  . Leukocytosis 10/30/2016  . GERD (gastroesophageal reflux disease) 10/29/2016  . Small bowel obstruction ([place]) 10/29/2016  . History of drug use 07/17/2016  . Constipation 05/30/2016  . Fecal incontinence 01/04/2016  . Hepatic cirrhosis ([place]) 04/08/2015  . History of adenomatous polyp of colon   . History of colonic polyps   . Hx of adenomatous colonic polyps 12/10/2014  . Lichen sclerosus et atrophicus 10/13/2014  . Vaginal itching 10/01/2014  . Vaginal irritation 10/01/2014  . Yeast infection  of the vagina 10/01/2014  . Blurred vision, right eye 03/20/2014  . UTI (lower urinary tract infection) 03/20/2014  . Cocaine abuse ([place]) 03/20/2014  . Generalized weakness 03/20/2014  . Chest pain 02/10/2014  . Chest pain at rest 02/10/2014  . Type 2 diabetes mellitus ([place])   . Diabetes mellitus, type II ([place])   . Tobacco abuse   . PSVT (paroxysmal supraventricular tachycardia) ([place]) 11/04/2013  . Essential hypertension 10/16/2013  . Renal mass 10/14/2013  . Ovarian mass, left 08/17/2013  . Hepatitis C, chronic ([place]) 07/01/2013  . Elevated LFTs 07/01/2013  .  Other dysphagia 07/01/2013  . Encounter for screening colonoscopy 07/01/2013    Past Surgical History:  Procedure Laterality Date  . ABDOMINAL HYSTERECTOMY    . COLONOSCOPY N/A 07/08/2013   RMR: Multiple colonic polyps treated [name] as described above. Inadequate prepartation comprimised examination there colonic diverticulosis. tubular and tubulovillous adenomas. next TCS 12/2013  . COLONOSCOPY N/A 02/09/2015   RMR: Colonic polyp removed as described above. Tubular adenoma. Next colonoscopy January 2022.  . ESOPHAGOGASTRODUODENOSCOPY N/A 07/08/2013   RMR: Schatzki's ring ; small hiatal hernia-status post passage of a Maloney dilator  . MALONEY DILATION N/A 07/08/2013   Procedure: [name] DILATION;  Surgeon: [name], MD;  Location: [facility] ENDO SUITE;  Service: Endoscopy;  Laterality: N/A;  . ROBOT ASSISTED LAPAROSCOPIC NEPHRECTOMY Right 10/14/2013   Procedure: ROBOTIC ASSISTED LAPAROSCOPIC RADICAL NEPHRECTOMY;  Surgeon: [name], MD;  Location: [facility] ORS;  Service: Urology;  Laterality: Right;  . SAVORY DILATION N/A 07/08/2013   Procedure: SAVORY DILATION;  Surgeon: [name], MD;  Location: [facility] ENDO SUITE;  Service: Endoscopy;  Laterality: N/A;  . TONSILLECTOMY    . Wart removal       OB History    Gravida  1   Para  1   Term      Preterm      AB      Living  1     SAB      TAB      Ectopic      Multiple      Live Births               Home Medications    Prior to Admission medications   Medication Sig Start Date End Date Taking? Authorizing Provider  amiodarone (PACERONE) 100 MG tablet TAKE ONE TABLET BY MOUTH ONCE DAILY Patient taking differently: Take 100 mg by mouth daily. TAKE ONE TABLET BY MOUTH ONCE DAILY 01/14/17   Satira Sark, MD  amLODipine (NORVASC) 10 MG tablet Take 1 tablet (10 mg total) by mouth daily. 11/04/16   Orson Eva, MD  carvedilol (COREG) 3.125 MG tablet Take 1 tablet (3.125 mg total) by mouth 2 (two) times daily with a  meal. 11/20/16   Tat, Shanon Brow, MD  cloNIDine (CATAPRES) 0.2 MG tablet Take 0.2 mg by mouth 2 (two) times daily.    [provider]  gabapentin (NEURONTIN) 300 MG capsule Take 1 capsule (300 mg total) by mouth 2 (two) times daily. Patient taking differently: Take 600 mg by mouth 3 (three) times daily.  11/06/14   Francine Graven, DO  HYDROCHLOROTHIAZIDE PO Take 12.5 mg by mouth daily. tablet    [provider]  insulin detemir (LEVEMIR) 100 UNIT/ML injection Inject 3 Units into the skin 3 (three) times daily.     [provider]  lisinopril-hydrochlorothiazide (PRINZIDE,ZESTORETIC) 20-25 MG tablet Take 1 tablet by mouth 2 (two) times daily.  [provider]  pantoprazole (PROTONIX) 40 MG tablet Take 1 tablet (40 mg total) by mouth daily. Take 30 minutes before breakfast 03/06/16   Annitta Needs, NP    Family History Family History  Problem Relation Age of Onset  . Colon cancer Sister 80  . Cancer Mother   . Heart attack Father   . Hypertension Daughter   . Cancer Maternal Grandfather   . Diabetes Paternal Grandmother     Social History Social History   Tobacco Use  . Smoking status: Current Some Day Smoker    Packs/day: 0.10    Years: 40.00    Pack years: 4.00    Types: Cigarettes  . Smokeless tobacco: Never Used  Substance Use Topics  . Alcohol use: No    Alcohol/week: 0.0 oz    Comment: rarely  . Drug use: Yes    Types: Marijuana, Cocaine    Comment: 3 days ago     Allergies   Sulfa antibiotics   Review of Systems Review of Systems  All other systems reviewed and are negative.    Physical Exam Updated Vital Signs BP 121/65   Pulse (!) 58   Temp 98.6 F (37 C) (Oral)   Resp 15   Wt 63.5 kg (140 lb)   SpO2 91%   BMI 27.34 kg/m   Physical Exam  Constitutional: She appears well-developed and well-nourished. No distress.  HENT:  Head: Normocephalic and atraumatic.  Mouth/Throat: Oropharynx is clear and moist. No  oropharyngeal exudate.  Eyes: Pupils are equal, round, and reactive to light. Conjunctivae and EOM are normal. Right eye exhibits no discharge. Left eye exhibits no discharge. No scleral icterus.  Neck: Normal range of motion. Neck supple. No JVD present. No thyromegaly present.  Cardiovascular: Normal rate, regular rhythm and intact distal pulses. Exam reveals no gallop and no friction rub.  Murmur ( systolic murmur) heard. Pulmonary/Chest: Effort normal and breath sounds normal. No respiratory distress. She has no wheezes. She has no rales.  Abdominal: Soft. Bowel sounds are normal. She exhibits no distension and no mass. There is no tenderness.  Musculoskeletal: Normal range of motion. She exhibits no edema or tenderness.  Lymphadenopathy:    She has no cervical adenopathy.  Neurological: She is alert. Coordination normal.  The patient is pleasant, interactive, she has a normal symmetrical smile and normal sensation of her face arms and legs.  She is able to lift each of her arms and legs into the air for 5 seconds though she does have mild myoclonic jerking with this.  She has no seizure activity, normal strength in her grips, her ability to finger-nose-finger is impaired and the patient is having difficulty with touching the examiner's fingers.  When we walk the patient has ataxia and requires a stabilizing hand otherwise she is falling right and left as she tries to ambulate.  Skin: Skin is warm and dry. No rash noted. No erythema.  Psychiatric: She has a normal mood and affect. Her behavior is normal.  Nursing note and vitals reviewed.    ED Treatments / Results  Labs (all labs ordered are listed, but only abnormal results are displayed) Labs Reviewed  DIFFERENTIAL - Abnormal; Notable for the following components:      Result Value   Lymphs Abs 4.5 (*)    All other components within normal limits  CBG MONITORING, ED - Abnormal; Notable for the following components:    Glucose-Capillary 118 (*)    All other components within  normal limits  I-STAT CHEM 8, ED - Abnormal; Notable for the following components:   Potassium 3.2 (*)    Chloride 98 (*)    BUN 29 (*)    Creatinine, Ser 1.90 (*)    Glucose, Bld 113 (*)    Calcium, Ion 1.10 (*)    All other components within normal limits  CBC  ETHANOL  PROTIME-INR  APTT  BASIC METABOLIC PANEL  URINALYSIS, ROUTINE W REFLEX MICROSCOPIC  RAPID URINE DRUG SCREEN, HOSP PERFORMED  I-STAT TROPONIN, ED    EKG EKG Interpretation  Date/Time:  Tuesday May 21 2017 21:50:33 EDT Ventricular Rate:  61 PR Interval:    QRS Duration: 100 QT Interval:  487 QTC Calculation: 491 R Axis:   15 Text Interpretation:  Sinus rhythm Probable left ventricular hypertrophy Borderline prolonged QT interval Since last tracing LVH now seen Confirmed by Noemi Chapel [phone]) on 05/21/2017 10:28:45 PM   Radiology No results found.  Procedures .Critical Care Performed by: Noemi Chapel, MD Authorized by: Noemi Chapel, MD   Critical care provider statement:    Critical care time (minutes):  35   Critical care time was exclusive of:  Separately billable procedures and treating other patients and teaching time   Critical care was necessary to treat or prevent imminent or life-threatening deterioration of the following conditions:  CNS failure or compromise   Critical care was time spent personally by me on the following activities:  Blood draw for specimens, development of treatment plan with patient or surrogate, discussions with consultants, evaluation of patient's response to treatment, examination of patient, obtaining history from patient or surrogate, ordering and performing treatments and interventions, ordering and review of laboratory studies, ordering and review of radiographic studies, pulse oximetry, re-evaluation of patient's condition and review of old charts   (including critical care time)  Medications Ordered in  ED Medications  iopamidol (ISOVUE-370) 76 % injection 150 mL (120 mLs Intravenous Contrast Given 05/21/17 2311)     Initial Impression / Assessment and Plan / ED Course  I have reviewed the triage vital signs and the nursing notes.  Pertinent labs & imaging results that were available during my care of the patient were reviewed by me and considered in my medical decision making (see chart for details).     The patient is acutely ill with what could be an ischemic stroke, could be hemorrhagic stroke, this could be Warnicke's, this could be related to nutrient deficiencies, alcohol abuse, cocaine abuse, she is not hypertensive and in fact is relatively hypotensive but is not tachycardic febrile or having any other systemic symptoms.  She will need a stat CT scan of the brain though at this time she is out of the window for thrombolytic therapy.  I do not see that she has a large vessel occlusion by criteria and this seems to be more posterior circulation related.  I have requested a tele-neurology consultation at 10:07 PM.  Discussed the results with the radiologist as well as with the tele-neurologist, the patient has had an acute posterior circulation infarct on the left which is likely causing her ataxia.  I have paged the hospitalist for admission.  The patient will need to be admitted to the hospital.  She is critically ill with an acute ischemic stroke, she does not meet any requirements for intervention, clot removal or thrombolytic therapy.  I reevaluated the patient multiple times, spoken with multiple consultants, provided supportive care with vital signs, the patient is not hypertensive, she  has not been in an arrhythmia and does not have any signs of atrial fibrillation.  She does have an acute kidney injury with a creatinine of 1.9 which is up from prior  Final Clinical Impressions(s) / ED Diagnoses   Final diagnoses:  Acute ischemic stroke [facility])    ED Discharge Orders    None         Noemi Chapel, MD 05/21/17 2347

## 2017-05-22 ENCOUNTER — Inpatient Hospital Stay (HOSPITAL_COMMUNITY): Payer: Medicare HMO

## 2017-05-22 ENCOUNTER — Encounter: Payer: Self-pay | Admitting: Cardiology

## 2017-05-22 ENCOUNTER — Encounter (HOSPITAL_COMMUNITY): Payer: Self-pay | Admitting: *Deleted

## 2017-05-22 ENCOUNTER — Other Ambulatory Visit: Payer: Self-pay

## 2017-05-22 LAB — GLUCOSE, CAPILLARY
GLUCOSE-CAPILLARY: 102 mg/dL — AB (ref 65–99)
Glucose-Capillary: 131 mg/dL — ABNORMAL HIGH (ref 65–99)
Glucose-Capillary: 184 mg/dL — ABNORMAL HIGH (ref 65–99)
Glucose-Capillary: 106 mg/dL — ABNORMAL HIGH (ref 65–99)
Glucose-Capillary: 174 mg/dL — ABNORMAL HIGH (ref 65–99)

## 2017-05-22 LAB — PHOSPHORUS: Phosphorus: 4.9 mg/dL — ABNORMAL HIGH (ref 2.5–4.6)

## 2017-05-22 LAB — HEPATIC FUNCTION PANEL
ALT: 39 U/L (ref 14–54)
AST: 65 U/L — AB (ref 15–41)
Albumin: 3.3 g/dL — ABNORMAL LOW (ref 3.5–5.0)
Alkaline Phosphatase: 50 U/L (ref 38–126)
BILIRUBIN DIRECT: 0.1 mg/dL (ref 0.1–0.5)
BILIRUBIN INDIRECT: 0.7 mg/dL (ref 0.3–0.9)
Total Bilirubin: 0.8 mg/dL (ref 0.3–1.2)
Total Protein: 7.5 g/dL (ref 6.5–8.1)

## 2017-05-22 LAB — BASIC METABOLIC PANEL
ANION GAP: 13 (ref 5–15)
BUN: 30 mg/dL — AB (ref 6–20)
CALCIUM: 8.8 mg/dL — AB (ref 8.9–10.3)
CO2: 26 mmol/L (ref 22–32)
Chloride: 97 mmol/L — ABNORMAL LOW (ref 101–111)
Creatinine, Ser: 1.78 mg/dL — ABNORMAL HIGH (ref 0.44–1.00)
GFR calc Af Amer: 33 mL/min — ABNORMAL LOW (ref >60)
GFR, EST NON AFRICAN AMERICAN: 29 mL/min — AB (ref >60)
Glucose, Bld: 121 mg/dL — ABNORMAL HIGH (ref 65–99)
POTASSIUM: 3.6 mmol/L (ref 3.5–5.1)
SODIUM: 136 mmol/L (ref 135–145)

## 2017-05-22 LAB — HEMOGLOBIN A1C
Hgb A1c MFr Bld: 6.9 % — ABNORMAL HIGH (ref 4.8–5.6)
Mean Plasma Glucose: 151.33 mg/dL

## 2017-05-22 LAB — LIPID PANEL
Cholesterol: 292 mg/dL — ABNORMAL HIGH (ref 0–200)
HDL: 64 mg/dL (ref >40)
LDL CALC: 196 mg/dL — AB (ref 0–99)
Total CHOL/HDL Ratio: 4.6 RATIO
Triglycerides: 159 mg/dL — ABNORMAL HIGH (ref <150)
VLDL: 32 mg/dL (ref 0–40)

## 2017-05-22 LAB — MAGNESIUM: MAGNESIUM: 1.9 mg/dL (ref 1.7–2.4)

## 2017-05-22 MED ORDER — POTASSIUM CHLORIDE IN NACL 20-0.9 MEQ/L-% IV SOLN
INTRAVENOUS | Status: DC
  Administered 2017-05-22 – 2017-05-23 (×2): via INTRAVENOUS

## 2017-05-22 MED ORDER — GABAPENTIN 300 MG PO CAPS
600.0000 mg | ORAL_CAPSULE | Freq: Three times a day (TID) | ORAL | Status: DC
  Administered 2017-05-22 – 2017-05-23 (×4): 600 mg via ORAL
  Filled 2017-05-22 (×4): qty 2

## 2017-05-22 MED ORDER — INSULIN DETEMIR 100 UNIT/ML ~~LOC~~ SOLN
3.0000 [IU] | Freq: Three times a day (TID) | SUBCUTANEOUS | Status: DC
  Filled 2017-05-22 (×4): qty 0.03

## 2017-05-22 MED ORDER — PANTOPRAZOLE SODIUM 40 MG PO TBEC
40.0000 mg | DELAYED_RELEASE_TABLET | Freq: Every day | ORAL | Status: DC
  Administered 2017-05-22 – 2017-05-23 (×2): 40 mg via ORAL
  Filled 2017-05-22 (×2): qty 1

## 2017-05-22 MED ORDER — ENOXAPARIN SODIUM 40 MG/0.4ML ~~LOC~~ SOLN: 40.0000 mg | SUBCUTANEOUS | Status: DC

## 2017-05-22 MED ORDER — ACETAMINOPHEN 325 MG PO TABS
650.0000 mg | ORAL_TABLET | Freq: Four times a day (QID) | ORAL | Status: DC | PRN
  Administered 2017-05-23: 650 mg via ORAL
  Filled 2017-05-22: qty 2

## 2017-05-22 MED ORDER — STROKE: EARLY STAGES OF RECOVERY BOOK
Freq: Once | Status: AC
  Administered 2017-05-22: 12:00:00
  Filled 2017-05-22 (×2): qty 1

## 2017-05-22 MED ORDER — INSULIN ASPART 100 UNIT/ML ~~LOC~~ SOLN
0.0000 [IU] | Freq: Three times a day (TID) | SUBCUTANEOUS | Status: DC
  Administered 2017-05-22 – 2017-05-23 (×2): 2 [IU] via SUBCUTANEOUS

## 2017-05-22 MED ORDER — ONDANSETRON HCL 4 MG/2ML IJ SOLN: 4.0000 mg | Freq: Four times a day (QID) | INTRAMUSCULAR | Status: DC | PRN

## 2017-05-22 MED ORDER — MAGNESIUM SULFATE 2 GM/50ML IV SOLN
2.0000 g | Freq: Once | INTRAVENOUS | Status: AC
  Administered 2017-05-22: 2 g via INTRAVENOUS
  Filled 2017-05-22: qty 50

## 2017-05-22 MED ORDER — ASPIRIN 81 MG PO CHEW
324.0000 mg | CHEWABLE_TABLET | Freq: Once | ORAL | Status: AC
  Administered 2017-05-22: 324 mg via ORAL
  Filled 2017-05-22: qty 4

## 2017-05-22 MED ORDER — ENOXAPARIN SODIUM 30 MG/0.3ML ~~LOC~~ SOLN
30.0000 mg | SUBCUTANEOUS | Status: DC
  Administered 2017-05-22 – 2017-05-23 (×2): 30 mg via SUBCUTANEOUS
  Filled 2017-05-22: qty 0.3

## 2017-05-22 MED ORDER — ACETAMINOPHEN 650 MG RE SUPP: 650.0000 mg | Freq: Four times a day (QID) | RECTAL | Status: DC | PRN

## 2017-05-22 MED ORDER — ONDANSETRON HCL 4 MG PO TABS: 4.0000 mg | ORAL_TABLET | Freq: Four times a day (QID) | ORAL | Status: DC | PRN

## 2017-05-22 MED ORDER — AMIODARONE HCL 200 MG PO TABS
100.0000 mg | ORAL_TABLET | Freq: Every day | ORAL | Status: DC
  Administered 2017-05-22 – 2017-05-23 (×2): 100 mg via ORAL
  Filled 2017-05-22 (×2): qty 1

## 2017-05-22 MED ORDER — ENOXAPARIN SODIUM 40 MG/0.4ML ~~LOC~~ SOLN
40.0000 mg | SUBCUTANEOUS | Status: DC
  Filled 2017-05-22: qty 0.4

## 2017-05-22 MED ORDER — INSULIN DETEMIR 100 UNIT/ML ~~LOC~~ SOLN
10.0000 [IU] | Freq: Every day | SUBCUTANEOUS | Status: DC
  Administered 2017-05-22 – 2017-05-23 (×2): 10 [IU] via SUBCUTANEOUS
  Filled 2017-05-22 (×3): qty 0.1

## 2017-05-22 MED ORDER — ASPIRIN EC 81 MG PO TBEC
81.0000 mg | DELAYED_RELEASE_TABLET | Freq: Every day | ORAL | Status: DC
  Administered 2017-05-23: 81 mg via ORAL
  Filled 2017-05-22: qty 1

## 2017-05-22 MED ORDER — ATORVASTATIN CALCIUM 40 MG PO TABS
40.0000 mg | ORAL_TABLET | Freq: Every day | ORAL | Status: DC
  Administered 2017-05-22: 40 mg via ORAL
  Filled 2017-05-22: qty 1

## 2017-05-22 NOTE — Progress Notes (Signed)
OT Cancellation Note  Patient Details Name: Cassandra Jordan MRN: [id number] DOB: [birth year]   Cancelled Treatment:    Reason Eval/Treat Not Completed: Patient at procedure or test/ unavailable. Pt off floor for testing on OT arrival, will check back at a later time.   Guadelupe Sabin, OTR/L  [phone] 05/22/2017, 8:50 AM

## 2017-05-22 NOTE — Evaluation (Signed)
Physical Therapy Evaluation Patient Details Name: Cassandra Jordan MRN: [id number] DOB: 05-Sep-1950 Today's Date: 05/22/2017   History of Present Illness  Cassandra Jordan is a 67 y.o. female with medical history significant of alcohol abuse, arthritis, asthma, history of cocaine abuse, essential hypertension, GERD, hepatitis C, hyperlipidemia, neuropathic pain history, history of noncompliance, paroxysmal SVT, renal cell carcinoma, type 2 diabetes who is coming due to generalized weakness for about an hour prior to arrival to the ED.    Clinical Impression  Patient functioning near baseline for functional mobility and gait, demonstrates slow labored ataxic like gait with decreased arm swing secondary to guarding, after VC's to let arms swing, demonstrates less ataxia when taking steps, demonstrates mostly 3 point gait pattern using quad cane, limited for sitting up in chair secondary to c/o increased low back pain and requested to go back to bed after therapy.  Patient will benefit from continued physical therapy in hospital and recommended venue below to increase strength, balance, endurance for safe ADLs and gait.    Follow Up Recommendations Home health PT    Equipment Recommendations  Cane    Recommendations for Other Services       Precautions / Restrictions Precautions Precautions: Fall Restrictions Weight Bearing Restrictions: No      Mobility  Bed Mobility Overal bed mobility: Needs Assistance Bed Mobility: Supine to Sit;Sit to Supine     Supine to sit: Supervision Sit to supine: Supervision   General bed mobility comments: slow labored movement  Transfers Overall transfer level: Needs assistance Equipment used: None;Quad cane Transfers: Sit to/from [facility] Sit to Stand: Supervision Stand pivot transfers: Supervision       General transfer comment: slow labored movement  Ambulation/Gait Ambulation/Gait assistance: Supervision Ambulation  Distance (Feet): 100 Feet Assistive device: Quad cane;None Gait Pattern/deviations: Decreased step length - right;Decreased step length - left;Step-through pattern;Wide base of support;Ataxic Gait velocity: decreased   General Gait Details: slow labored cadence with decreased arm swing, slightly ataxic like with wider base of support no loss of balance, slightly improved balance with quad cane with mostly 3 point pattern   Stairs            Wheelchair Mobility    Modified Rankin (Stroke Patients Only)       Balance Overall balance assessment: Mild deficits observed, not formally tested                                           Pertinent Vitals/Pain Pain Assessment: 0-10 Pain Score: 5  Pain Location: low back, posterior neck Pain Descriptors / Indicators: Aching Pain Intervention(s): Limited activity within patient's tolerance;Monitored during session    Home Living Family/patient expects to be discharged to:: Private residence Living Arrangements: Alone Available Help at Discharge: Family(daughter may be able to help? per patient) Type of Home: Apartment Home Access: Level entry     Home Layout: One level Home Equipment: Environmental consultant - standard      Prior Function Level of Independence: Independent         Comments: community ambulator, drives     Hand Dominance   Dominant Hand: Right    Extremity/Trunk Assessment   Upper Extremity Assessment Upper Extremity Assessment: Defer to OT evaluation    Lower Extremity Assessment Lower Extremity Assessment: Generalized weakness;RLE deficits/detail;LLE deficits/detail RLE Deficits / Details: grossly -4/5 LLE Deficits / Details: grossly 4+/5  Cervical / Trunk Assessment Cervical / Trunk Assessment: Normal  Communication   Communication: No difficulties  Cognition Arousal/Alertness: Awake/alert Behavior During Therapy: WFL for tasks assessed/performed Overall Cognitive Status: Within  Functional Limits for tasks assessed                                        General Comments      Exercises     Assessment/Plan    PT Assessment Patient needs continued PT services  PT Problem List Decreased strength;Decreased activity tolerance;Decreased balance;Decreased mobility       PT Treatment Interventions Gait training;Functional mobility training;Stair training;Therapeutic activities;Therapeutic exercise;Patient/family education    PT Goals (Current goals can be found in the Care Plan section)  Acute Rehab PT Goals Patient Stated Goal: return home PT Goal Formulation: With patient Time For Goal Achievement: 05/25/17 Potential to Achieve Goals: Good    Frequency 7X/week   Barriers to discharge        Co-evaluation               AM-PAC PT "6 Clicks" Daily Activity  Outcome Measure Difficulty turning over in bed (including adjusting bedclothes, sheets and blankets)?: None Difficulty moving from lying on back to sitting on the side of the bed? : None Difficulty sitting down on and standing up from a chair with arms (e.g., wheelchair, bedside commode, etc,.)?: A Little Help needed moving to and from a bed to chair (including a wheelchair)?: A Little Help needed walking in hospital room?: A Little Help needed climbing 3-5 steps with a railing? : A Little 6 Click Score: 20    End of Session Equipment Utilized During Treatment: Gait belt Activity Tolerance: Patient tolerated treatment well;Patient limited by pain Patient left: in bed;with call bell/phone within reach;with bed alarm set Nurse Communication: Mobility status PT Visit Diagnosis: Unsteadiness on feet (R26.81);Other abnormalities of gait and mobility (R26.89);Muscle weakness (generalized) (M62.81)    Time: 4818-5631 PT Time Calculation (min) (ACUTE ONLY): 29 min   Charges:   PT Evaluation $PT Eval Moderate Complexity: 1 Mod PT Treatments $Gait Training: 8-22  mins $Therapeutic Activity: 8-22 mins   PT G Codes:        1:39 PM, 14-Jun-2017 Lonell Grandchild, MPT Physical Therapist with [facility] [phone] office [phone] mobile phone

## 2017-05-22 NOTE — Care Management Note (Signed)
Case Management Note  Patient Details  Name: Cassandra Jordan MRN: [id number] Date of Birth: 1950-04-09  Subjective/Objective:          Admitted with CVA. Pt from home, lives alone. Has PCP, insurance and no difficulty affordin gmedicaitons. She drives to appointments at baseline.           Action/Plan: DC home with HH PT and cane. Pt has no preference of HH/DME provider. Okay with using [facility]. Juliann Pulse, [facility] rep, aware of referral and will pull pt info from chart and deliver cane to pt room prior to DC.   Expected Discharge Date:  05/23/17               Expected Discharge Plan:  [place]  In-House Referral:  NA  Discharge planning Services  CM Consult  Post Acute Care Choice:  Home Health, Durable Medical Equipment Choice offered to:  Patient  DME Arranged:  [name] DME Agency:  [place]:  PT [facility] Agency:  [place]  Status of Service:  Completed, signed off  Sherald Barge, RN 05/22/2017, 2:46 PM

## 2017-05-22 NOTE — ED Notes (Signed)
Pt states she cannot urinate at this time.

## 2017-05-22 NOTE — Evaluation (Signed)
Speech Language Pathology Evaluation Patient Details Name: LETISHIA ELLIOTT MRN: [id number] DOB: 12-Feb-1950 Today's Date: 05/22/2017 Time:  -     Problem List:  Patient Active Problem List   Diagnosis Date Noted  . Hyperphosphatemia 05/22/2017  . Acute CVA (cerebrovascular accident) ([place]) 05/21/2017  . Abdominal pain 11/18/2016  . Cholelithiasis 11/18/2016  . Uncontrolled type 2 diabetes mellitus with hyperglycemia, with long-term current use of insulin ([place]) 11/18/2016  . CKD (chronic kidney disease), stage III ([place]) 11/18/2016  . Acute pancreatitis   . Ileus ([place])   . Porcelain gallbladder   . Acute renal failure superimposed on stage 3 chronic kidney disease ([place]) 11/04/2016  . Hypokalemia 11/01/2016  . Hypomagnesemia 11/01/2016  . SBO (small bowel obstruction) ([place])   . AKI (acute kidney injury) ([place]) 10/30/2016  . Leukocytosis 10/30/2016  . GERD (gastroesophageal reflux disease) 10/29/2016  . Small bowel obstruction ([place]) 10/29/2016  . History of drug use 07/17/2016  . Constipation 05/30/2016  . Fecal incontinence 01/04/2016  . Hepatic cirrhosis ([place]) 04/08/2015  . History of adenomatous polyp of colon   . History of colonic polyps   . Hx of adenomatous colonic polyps 12/10/2014  . Lichen sclerosus et atrophicus 10/13/2014  . Vaginal itching 10/01/2014  . Vaginal irritation 10/01/2014  . Yeast infection of the vagina 10/01/2014  . Blurred vision, right eye 03/20/2014  . UTI (lower urinary tract infection) 03/20/2014  . Cocaine abuse ([place]) 03/20/2014  . Generalized weakness 03/20/2014  . Chest pain 02/10/2014  . Chest pain at rest 02/10/2014  . Type 2 diabetes mellitus ([place])   . Diabetes mellitus, type II ([place])   . Tobacco abuse   . PSVT (paroxysmal supraventricular tachycardia) ([place]) 11/04/2013  . Essential hypertension 10/16/2013  . Renal mass 10/14/2013  . Ovarian mass, left 08/17/2013  . Hepatitis C, chronic ([place]) 07/01/2013  . Elevated LFTs 07/01/2013  .  Other dysphagia 07/01/2013  . Encounter for screening colonoscopy 07/01/2013   Past Medical History:  Past Medical History:  Diagnosis Date  . Alcohol abuse   . Arthritis   . Asthma   . Cocaine abuse ([place])   . Essential hypertension   . GERD (gastroesophageal reflux disease)   . Hepatitis C   . Hyperlipidemia   . Lichen sclerosus et atrophicus 10/13/2014  . Neuropathic pain   . Noncompliance   . PSVT (paroxysmal supraventricular tachycardia) ([place])   . Rash   . Renal carcinoma ([place])   . Type 2 diabetes mellitus ([place])   . Vaginal irritation 10/01/2014  . Vaginal itching 10/01/2014  . Yeast infection of the vagina 10/01/2014   Past Surgical History:  Past Surgical History:  Procedure Laterality Date  . ABDOMINAL HYSTERECTOMY    . COLONOSCOPY N/A 07/08/2013   RMR: Multiple colonic polyps treated [name] as described above. Inadequate prepartation comprimised examination there colonic diverticulosis. tubular and tubulovillous adenomas. next TCS 12/2013  . COLONOSCOPY N/A 02/09/2015   RMR: Colonic polyp removed as described above. Tubular adenoma. Next colonoscopy January 2022.  . ESOPHAGOGASTRODUODENOSCOPY N/A 07/08/2013   RMR: Schatzki's ring ; small hiatal hernia-status post passage of a Maloney dilator  . MALONEY DILATION N/A 07/08/2013   Procedure: [name] DILATION;  Surgeon: [name], MD;  Location: [facility] END[facility] SUITE;  Service: Endoscopy;  Laterality: N/A;  . ROBOT ASSISTED LAPAROSCOPIC NEPHRECTOMY Right 10/14/2013   Procedure: ROBOTIC ASSISTED LAPAROSCOPIC RADICAL NEPHRECTOMY;  Surgeon: [name], MD;  Location: [facility] ORS;  Service: Urology;  Laterality: Right;  . SAVORY DILATION N/A 07/08/2013  Procedure: SAVORY DILATION;  Surgeon: [name], MD;  Location: [facility] ENDO SUITE;  Service: Endoscopy;  Laterality: N/A;  . TONSILLECTOMY    . Wart removal     HPI:  GEORGINA KRIST is a 67 y.o. female with medical history significant of alcohol abuse, arthritis, asthma, history of  cocaine abuse, essential hypertension, GERD, hepatitis C, hyperlipidemia, neuropathic pain history, history of noncompliance, paroxysmal SVT, renal cell carcinoma, type 2 diabetes who is coming due to generalized weakness for about an hour prior to arrival to the ED.  Imaging: CT angiogram of head and neck shows an area of acute ischemia within the left posterior cerebral artery territory without core infarct by perfusion criteria.  There was no hemorrhage.  There is severe diffuse narrowing of the left posterior cerebral artery with possible source of mental occlusion in the P2 segment.  Also severe short segment stenosis of the proximal left MCA M1 segment, which is otherwise normal.  There aspirate symmetry 80% stenosis of the right internal carotid artery secondary to calcific atherosclerosis.  There is moderate to severe stenosis of both internal carotid arteries at the skull base.    Assessment / Plan / Recommendation Clinical Impression  Pt was alert and cooperative during assessment and significant deficits in memory, executive function skills and attention.  The deficits noted above appear to have a chronic onset, as the patient reported difficulty with memory prior to hospitalization.  Based on her score of 12/30 on the [facility] Cognitive Assessment (MoCA), follow up with a speech-language pathologist is recommended upon re-entry to her next venue.       SLP Assessment  SLP Recommendation/Assessment: Patient needs continued Speech Lanaguage Pathology Services SLP Visit Diagnosis: Cognitive communication deficit (R41.841)    Follow Up Recommendations  Other (comment)(Rec SNF due to cognitive deficits)    Frequency and Duration Other (Comment)  (1x per week upon arrival to next venue)      SLP Evaluation Cognition  Overall Cognitive Status: No family/caregiver present to determine baseline cognitive functioning (Pt reported some issues with memory at baseline) Arousal/Alertness:  Awake/alert Orientation Level: Oriented to person;Oriented to place Attention: (Impaired (2/6 correct)) Memory: (Impaired (1/5 recalled after 5 minutes)) Awareness: Appears intact Problem Solving: Impaired Problem Solving Impairment: Verbal complex Executive Function: Organizing;Decision Making;Self Correcting Organizing: Impaired Organizing Impairment: Verbal complex Decision Making: Impaired Decision Making Impairment: Verbal complex Self Correcting: Appears intact Behaviors: Other (comment)(Polite and cooperative) Safety/Judgment: Appears intact       Comprehension  Auditory Comprehension Overall Auditory Comprehension: Appears within functional limits for tasks assessed Yes/No Questions: Within Functional Limits Commands: Within Functional Limits Conversation: Other (comment)(Pt asked for repetition of instructions x2 during evaluation) Interfering Components: Working memory EffectiveTechniques: Extra processing time;Repetition Reading Comprehension Reading Status: Unable to assess (comment)(Pt wears glasses, which were not in room)    Expression Verbal Expression Overall Verbal Expression: Appears within functional limits for tasks assessed Initiation: Impaired Automatic Speech: Name;Day of week Level of Generative/Spontaneous Verbalization: Conversation Repetition: Impaired Level of Impairment: Sentence level Naming: No impairment Pragmatics: No impairment Interfering Components: Attention Written Expression Dominant Hand: Right Written Expression: Not tested   Oral / Motor  Motor Speech Overall Motor Speech: Appears within functional limits for tasks assessed Respiration: Within functional limits Phonation: Normal Resonance: Within functional limits Articulation: Within functional limits Intelligibility: Intelligible Motor Planning: Witnin functional limits Motor Speech Errors: Not applicable   GO  Jen Mow, M.A., CF-SLP 05/22/2017,  4:19 PM

## 2017-05-22 NOTE — Progress Notes (Addendum)
PROGRESS NOTE  Cassandra Jordan WCB:[id number] DOB: [birth year] DOA: 05/21/2017 PCP: [facility]  Brief Narrative: 32 known EtOH, PSVT, cocaine, chronic noncompliance, diabetes mellitus TY 2, GERD, hep C, HLD etc. Admit with blurred vision-work-up emergency room CT head ischemic CVA left posterior cerebral artery--admitted for further work-up  Assessment/Plan  Acute CVA-CT angiogram head and neck showed acute ischemia left posterior cerebral artery without infarct no hemorrhage--he has evaluated recommending home health PT SLP sees the patient however and based on [place] recommending skilled care placement secondary to cognitive deficits We will defer final decision regarding anticoagulation etc. as to neurology Carotid ultrasound >70% stenosis right ICA less than 61% LICA unclear if patient would benefit from endarterectomy eventually  Acute superimposed on chronic CKD stage II-III BUN/creatinine on admission 33/2.0 currently 30/1.7 Cutting back saline 100-50 cc/hr with K  History hep C, EtOH-AST/ALT show steatotic pattern consistent with [place]-known chronic EtOH as well -Not a candidate for curative treatment given continued EtOH use per Dr. Roseanne Kaufman office  Hypertension-allow permissive hypertension-do not give beta-blocker secondary to reported cocaine use in the past should get drug screen if not already done  Type 2 diabetes-continue Levemir 10 units and sensitive sliding scale  NSVT/underlying A. fib?  History of being on amiodarone 100 daily-last seen by cardiology 2017 and may need further input in outpatient-given underlying history of NSVT would not be surprised given her multiple illnesses that she has undiagnosed A. fib and this may affect anticoagulation choice/antiplatelet choice as per neurologist  Recent cocaine use 4 days ago-unlikely will quit but we will give her the options and the benefit of the doubt and reevaluate at rehab   DVT prophylaxis:  Lovenox at this time Code Status: Full Family Communication: None Disposition Plan: Will need skilled level care given cognitive deficits which I feel are secondary to stroke and we will ask social work to see I do not think the patient is a good candidate for going home as she lives alone and baseline drives to appointments and with the stroke it is likely she will need some amount of neurocognitive rehab --she has mentioned to me that she is willing to go to rehab for "about a week or so"-social work to start looking for skilled placement Verneita Griffes, MD [facility] ([phone]  05/22/2017, 10:29 AM  LOS: 1 day   Consultants:  Neuro  Procedures:  Mul;tiple  Antimicrobials:  none  Interval history/Subjective: Awake alert Tells me she is used cocaine 4 days ago Habitual user Not confused able to tell me date time year and that the president is Trump  Objective: Vitals:  Vitals:   05/22/17 0739 05/22/17 0940  BP: 121/69 130/64  Pulse: (!) 55 (!) 58  Resp: 18 16  Temp: 97.9 F (36.6 C) 97.9 F (36.6 C)  SpO2: 96% 96%    Exam: Edentulous EOMI NCAT uvula midline No bruit Slightly weaker right upper extremity but I do not know if this is secondary to the IV in the antecubital fossa grip strength is bilaterally equal Reflexes 2/3 brachioradialis biceps and knees Chest is clinically clear without added sound No JVD Power 5/5 Babinski downgoing Finger-nose-finger normal Other coordination seems intact    Scheduled Meds: .  stroke: mapping our early stages of recovery book   Does not apply Once  . amiodarone  100 mg Oral Daily  . [START ON 05/23/2017] aspirin EC  81 mg Oral Daily  . enoxaparin (LOVENOX) injection  30 mg Subcutaneous  Q24H  . gabapentin  600 mg Oral TID  . insulin aspart  0-9 Units Subcutaneous TID WC  . insulin detemir  10 Units Subcutaneous Daily  . pantoprazole  40 mg Oral Daily   Continuous Infusions: . 0.9 % NaCl with KCl 20 mEq / L 100  mL/hr at 05/22/17 0214    Principal Problem:   Acute CVA (cerebrovascular accident) [facility]) Active Problems:   Hepatitis C, chronic ([place])   Essential hypertension   PSVT (paroxysmal supraventricular tachycardia) ([facility])   Type 2 diabetes mellitus ([facility])   Tobacco abuse   GERD (gastroesophageal reflux disease)   AKI (acute kidney injury) ([place])   Hypokalemia   Hyperphosphatemia   LOS: 1 day

## 2017-05-22 NOTE — Plan of Care (Signed)
  Problem: Acute Rehab PT Goals(only PT should resolve) Goal: Pt Will Go Supine/Side To Sit Outcome: Progressing Flowsheets (Taken 05/22/2017 1341) Pt will go Supine/Side to Sit: with modified independence Goal: Patient Will Transfer Sit To/From Stand Outcome: Progressing Flowsheets (Taken 05/22/2017 1341) Patient will transfer sit to/from stand: with modified independence Goal: Pt Will Transfer Bed To Chair/Chair To Bed Outcome: Progressing Flowsheets (Taken 05/22/2017 1341) Pt will Transfer Bed to Chair/Chair to Bed: with modified independence Goal: Pt Will Ambulate Outcome: Progressing Flowsheets (Taken 05/22/2017 1341) Pt will Ambulate: > 125 feet;with modified independence;with cane  1:42 PM, 05/22/17 Lonell Grandchild, MPT Physical Therapist with [facility] [phone] office [phone] mobile phone

## 2017-05-22 NOTE — Progress Notes (Signed)
Patient removed the hat from the toilet before voiding.  Explained again to the patient that we need a urine sample and please do not remove the hat when voiding.  Patient expressed understanding.

## 2017-05-23 ENCOUNTER — Inpatient Hospital Stay (HOSPITAL_COMMUNITY): Payer: Medicare HMO

## 2017-05-23 DIAGNOSIS — N179 Acute kidney failure, unspecified: Secondary | ICD-10-CM

## 2017-05-23 DIAGNOSIS — I340 Nonrheumatic mitral (valve) insufficiency: Secondary | ICD-10-CM

## 2017-05-23 DIAGNOSIS — I639 Cerebral infarction, unspecified: Principal | ICD-10-CM

## 2017-05-23 LAB — GLUCOSE, CAPILLARY
Glucose-Capillary: 180 mg/dL — ABNORMAL HIGH (ref 65–99)
Glucose-Capillary: 97 mg/dL (ref 65–99)

## 2017-05-23 LAB — CBC WITH DIFFERENTIAL/PLATELET
BASOS ABS: 0 10*3/uL (ref 0.0–0.1)
BASOS PCT: 0 %
Eosinophils Absolute: 0.2 10*3/uL (ref 0.0–0.7)
Eosinophils Relative: 2 %
HEMATOCRIT: 43.1 % (ref 36.0–46.0)
HEMOGLOBIN: 13.7 g/dL (ref 12.0–15.0)
Lymphocytes Relative: 55 %
Lymphs Abs: 5.2 10*3/uL — ABNORMAL HIGH (ref 0.7–4.0)
MCH: 28.3 pg (ref 26.0–34.0)
MCHC: 31.8 g/dL (ref 30.0–36.0)
MCV: 89 fL (ref 78.0–100.0)
Monocytes Absolute: 0.8 10*3/uL (ref 0.1–1.0)
Monocytes Relative: 8 %
NEUTROS ABS: 3.3 10*3/uL (ref 1.7–7.7)
NEUTROS PCT: 35 %
Platelets: 318 10*3/uL (ref 150–400)
RBC: 4.84 MIL/uL (ref 3.87–5.11)
RDW: 12.8 % (ref 11.5–15.5)
WBC: 9.6 10*3/uL (ref 4.0–10.5)

## 2017-05-23 LAB — BASIC METABOLIC PANEL
ANION GAP: 12 (ref 5–15)
BUN: 31 mg/dL — ABNORMAL HIGH (ref 6–20)
CALCIUM: 9 mg/dL (ref 8.9–10.3)
CO2: 26 mmol/L (ref 22–32)
Chloride: 99 mmol/L — ABNORMAL LOW (ref 101–111)
Creatinine, Ser: 1.96 mg/dL — ABNORMAL HIGH (ref 0.44–1.00)
GFR, EST AFRICAN AMERICAN: 29 mL/min — AB (ref >60)
GFR, EST NON AFRICAN AMERICAN: 25 mL/min — AB (ref >60)
Glucose, Bld: 140 mg/dL — ABNORMAL HIGH (ref 65–99)
Potassium: 3.5 mmol/L (ref 3.5–5.1)
Sodium: 137 mmol/L (ref 135–145)

## 2017-05-23 LAB — ECHOCARDIOGRAM COMPLETE
HEIGHTINCHES: 60 in
Weight: 2218.71 oz

## 2017-05-23 MED ORDER — ASPIRIN 81 MG PO TBEC: 81.0000 mg | DELAYED_RELEASE_TABLET | Freq: Every day | ORAL | Status: DC

## 2017-05-23 MED ORDER — ATORVASTATIN CALCIUM 40 MG PO TABS: 40.0000 mg | ORAL_TABLET | Freq: Every day | ORAL | 2 refills | Status: DC

## 2017-05-23 MED ORDER — INSULIN DETEMIR 100 UNIT/ML ~~LOC~~ SOLN: 10.0000 [IU] | Freq: Every day | SUBCUTANEOUS | 11 refills | Status: DC

## 2017-05-23 NOTE — Clinical Social Work Note (Signed)
Patient is going home with HHPT services.    LCSW signing off.     Leyla Soliz, Clydene Pugh, LCSW

## 2017-05-23 NOTE — Evaluation (Signed)
Occupational Therapy Evaluation Patient Details Name: Cassandra Jordan MRN: [id number] DOB: 1950-07-29 Today's Date: 05/23/2017    History of Present Illness Cassandra Jordan is a 67 y.o. female with medical history significant of alcohol abuse, arthritis, asthma, history of cocaine abuse, essential hypertension, GERD, hepatitis C, hyperlipidemia, neuropathic pain history, history of noncompliance, paroxysmal SVT, renal cell carcinoma, type 2 diabetes who is coming due to generalized weakness for about an hour prior to arrival to the ED.   Clinical Impression   Pt received supine, agreeable to OT evaluation. Pt demonstrates baseline functioning with ADL completion, supervision for standing tasks due to occasional unsteadiness. BUE strength is WFL at grossly 4+/5, sensation and coordination are intact. No further OT services required at this time.     Follow Up Recommendations  No OT follow up;Supervision/Assistance - 24 hour    Equipment Recommendations  Tub/shower seat       Precautions / Restrictions Precautions Precautions: Fall Restrictions Weight Bearing Restrictions: No      Mobility Bed Mobility Overal bed mobility: Modified Independent                Transfers Overall transfer level: Needs assistance Equipment used: Straight cane Transfers: Sit to/from Stand Sit to Stand: Supervision                  ADL either performed or assessed with clinical judgement   ADL Overall ADL's : Needs assistance/impaired     Grooming: Wash/dry hands;Wash/dry face;Supervision/safety;Standing               Lower Body Dressing: Modified independent;Sitting/lateral leans   Toilet Transfer: Supervision/safety;Regular Toilet   Toileting- Clothing Manipulation and Hygiene: Supervision/safety;Sitting/lateral lean;Sit to/from stand       Functional mobility during ADLs: Supervision/safety;Cane       Vision Baseline Vision/History: Wears glasses Wears Glasses:  At all times(needs new glasses) Patient Visual Report: No change from baseline Vision Assessment?: Yes Eye Alignment: Within Functional Limits Ocular Range of Motion: Within Functional Limits Alignment/Gaze Preference: Within Defined Limits Tracking/Visual Pursuits: Able to track stimulus in all quads without difficulty Saccades: Within functional limits Convergence: Within functional limits Visual Fields: No apparent deficits            Pertinent Vitals/Pain Pain Assessment: No/denies pain Pain Score: 6  Pain Location: low to mid back Pain Descriptors / Indicators: Aching Pain Intervention(s): Limited activity within patient's tolerance;Monitored during session;Repositioned     Hand Dominance Right   Extremity/Trunk Assessment Upper Extremity Assessment Upper Extremity Assessment: Overall WFL for tasks assessed   Lower Extremity Assessment Lower Extremity Assessment: Defer to PT evaluation   Cervical / Trunk Assessment Cervical / Trunk Assessment: Normal   Communication Communication Communication: No difficulties   Cognition Arousal/Alertness: Awake/alert Behavior During Therapy: WFL for tasks assessed/performed Overall Cognitive Status: Within Functional Limits for tasks assessed(pt reports some STM issues sometimes)                                                Home Living Family/patient expects to be discharged to:: Private residence Living Arrangements: Alone Available Help at Discharge: Family;Friend(s);Neighbor;Available PRN/intermittently Type of Home: Apartment Home Access: Level entry     Home Layout: One level     Bathroom Shower/Tub: Teacher, early years/pre: Standard     Home Equipment: Walker - standard;Cane - single point;Grab  bars - tub/shower          Prior Functioning/Environment Level of Independence: Independent        Comments: community ambulator, drives        OT Problem List: Decreased  activity tolerance;Impaired balance (sitting and/or standing)       End of Session Equipment Utilized During Treatment: Gait belt(SPC)  Activity Tolerance: Patient tolerated treatment well Patient left: in bed;with call bell/phone within reach;with bed alarm set  OT Visit Diagnosis: Muscle weakness (generalized) (M62.81)                Time: 8889-1694 OT Time Calculation (min): 21 min Charges:  OT General Charges $OT Visit: 1 Visit OT Evaluation $OT Eval Low Complexity: [place], OTR/L  [phone] 05/23/2017, 8:02 AM

## 2017-05-23 NOTE — Discharge Summary (Signed)
Physician Discharge Summary  Cassandra Jordan RCV:[id number] DOB: [birth year] DOA: 05/21/2017  PCP: [facility] date: 05/21/2017 Discharge date: 05/23/2017  Time spent: 45 minutes  Recommendations for Outpatient Follow-up:  -Will be discharged home today. -Advised to follow up with PCP in 2 weeks.   Discharge Diagnoses:  Principal Problem:   Acute CVA (cerebrovascular accident) [facility]) Active Problems:   Hepatitis C, chronic ([place])   Essential hypertension   PSVT (paroxysmal supraventricular tachycardia) (HCC)   Type 2 diabetes mellitus (HCC)   Tobacco abuse   GERD (gastroesophageal reflux disease)   AKI (acute kidney injury) ([place])   Hypokalemia   Hyperphosphatemia   Discharge Condition: Stable and improved  Filed Weights   05/21/17 2307 05/22/17 0143  Weight: 63.5 kg (140 lb) 62.9 kg (138 lb 10.7 oz)    History of present illness:  As per Dr. Olevia Bowens on 4/30: Cassandra Jordan is a 67 y.o. female with medical history significant of alcohol abuse, arthritis, asthma, history of cocaine abuse, essential hypertension, GERD, hepatitis C, hyperlipidemia, neuropathic pain history, history of noncompliance, paroxysmal SVT, renal cell carcinoma, type 2 diabetes who is coming due to generalized weakness for about an hour prior to arrival to the ED.  Per patient, she was trying to fix herself something to eat around 1700, when she noticed that she was unable to focus.  Her vision got very blurred, she was unable to see and she got very dizzy.  She accidentally mildly burned her hand on the stove.  She called EMS and states that the symptoms lasted several minutes, but by the time the EMS crew arrived her symptoms had subsided significantly.  She denies fever, chills, sore throat, dyspnea, chest pain, palpitations, diaphoresis, PND, orthopnea or pitting edema of the lower extremities.  No abdominal pain, nausea, emesis, diarrhea or constipation.  No melena or  hematochezia.  Denies dysuria, frequency or hematuria.  Denies heat or cold intolerance.  No polyuria, polyphagia or polydipsia.  ED Course: Initial vital signs temperature 37 C (98.6 F), pulse 60, respiration 18, blood pressure 102/62 mmHg and O2 sat 98% on room air.  Dr. Sabra Heck discussed the case with tele-neurology and radiology.  No TPA given.  Please see his notes.    Hospital Course:   Acute CVA -MRI with Several subcentimeter areas of acute infarct left occipital lobe. Subcentimeter acute infarct right medial parietal lobe. -ECHO: Ejection fraction of 60 to 65% with normal wall motion, diastolic dysfunction, indeterminate grade. -Carotid Dopplers: Greater than 70% stenosis of the right ICA, less than 50% stenosis of the left ICA. -Has been started on aspirin for secondary stroke prevention, Lipitor with an LDL of 196. -30-day event monitor will be arranged through the [facility] cardiology office given the apparent embolic nature of the stroke. -Also discussed left ICA lesion with vascular, Dr. Donzetta Matters, who recommends outpatient follow-up. -Seen by physical therapy with recommendations for home health.  Acute on chronic kidney disease stage III -Baseline creatinine is currently at baseline of 1.7-1.9 on discharge.  Cocaine use -Counseled on cessation.  It appears to me that she is unlikely to quit at this time.  Procedures:  As above  Consultations:  Curbside consultation with vascular surgery  Discharge Instructions  Discharge Instructions    Diet - low sodium heart healthy   Complete by:  As directed    Increase activity slowly   Complete by:  As directed      Allergies as of  05/23/2017      Reactions   Sulfa Antibiotics Itching, Other (See Comments)   burning      Medication List    STOP taking these medications   cloNIDine 0.2 MG tablet Commonly known as:  CATAPRES   HYDROCHLOROTHIAZIDE PO     TAKE these medications   amiodarone 100 MG tablet Commonly  known as:  PACERONE TAKE ONE TABLET BY MOUTH ONCE DAILY What changed:    how much to take  how to take this  when to take this  additional instructions   amLODipine 10 MG tablet Commonly known as:  NORVASC Take 1 tablet (10 mg total) by mouth daily.   aspirin 81 MG EC tablet Take 1 tablet (81 mg total) by mouth daily. Start taking on:  05/24/2017   atorvastatin 40 MG tablet Commonly known as:  LIPITOR Take 1 tablet (40 mg total) by mouth daily at 6 PM.   carvedilol 3.125 MG tablet Commonly known as:  COREG Take 1 tablet (3.125 mg total) by mouth 2 (two) times daily with a meal.   gabapentin 600 MG tablet Commonly known as:  NEURONTIN Take 1 tablet by mouth 3 (three) times daily.   insulin detemir 100 UNIT/ML injection Commonly known as:  LEVEMIR Inject 0.1 mLs (10 Units total) into the skin daily. Start taking on:  05/24/2017 What changed:    how much to take  when to take this   lisinopril-hydrochlorothiazide 20-25 MG tablet Commonly known as:  PRINZIDE,ZESTORETIC Take 1 tablet by mouth 2 (two) times daily.   pantoprazole 40 MG tablet Commonly known as:  PROTONIX Take 1 tablet (40 mg total) by mouth daily. Take 30 minutes before breakfast            Durable Medical Equipment  (From admission, onward)        Start     Ordered   05/22/17 1451  For home use only DME Cane  Once     05/22/17 1450     Allergies  Allergen Reactions  . Sulfa Antibiotics Itching and Other (See Comments)    burning   Follow-up Information    [facility] Schedule an appointment as soon as possible for a visit in 2 week(s).   Contact information: [address] [place] [address] [phone]        Satira Sark, MD .   Specialty:  Cardiology Contact information: [place] [address] [phone]            The results of significant diagnostics from this hospitalization (including imaging, microbiology,  ancillary and laboratory) are listed below for reference.    Significant Diagnostic Studies: Ct Angio Head W Or Wo Contrast  Result Date: 05/21/2017 CLINICAL DATA:  Left-sided weakness.  Dizziness. EXAM: CT ANGIOGRAPHY HEAD AND NECK CT PERFUSION BRAIN TECHNIQUE: Multidetector CT imaging of the head and neck was performed using the standard protocol during bolus administration of intravenous contrast. Multiplanar CT image reconstructions and MIPs were obtained to evaluate the vascular anatomy. Carotid stenosis measurements (when applicable) are obtained utilizing NASCET criteria, using the distal internal carotid diameter as the denominator. Multiphase CT imaging of the brain was performed following IV bolus contrast injection. Subsequent parametric perfusion maps were calculated using RAPID software. CONTRAST:  168m ISOVUE-370 IOPAMIDOL (ISOVUE-370) INJECTION 76% COMPARISON:  Head CT 02/08/2017 FINDINGS: CT HEAD FINDINGS Brain: No mass lesion or acute hemorrhage. No focal hypoattenuation of the basal ganglia or cortex to indicate  infarcted tissue. There is periventricular hypoattenuation compatible with chronic microvascular disease. There is generalized atrophy without lobar predilection. Unchanged appearance of old left caudate head lacunar infarct. Vascular: No hyperdense vessel. Atherosclerotic calcification of the internal carotid arteries at the skull base. Skull: Normal visualized skull base, calvarium and extracranial soft tissues. Sinuses/Orbits: No sinus fluid levels or advanced mucosal thickening. No mastoid effusion. Normal orbits. ASPECTS ([place] Stroke Program Early CT Score) - Ganglionic level infarction (caudate, lentiform nuclei, internal capsule, insula, M1-M3 cortex): 7 - Supraganglionic infarction (M4-M6 cortex): 3 Total score (0-10 with 10 being normal): 10 CTA NECK FINDINGS AORTIC ARCH: There is no calcific atherosclerosis of the aortic arch. There is no aneurysm, dissection or  hemodynamically significant stenosis of the visualized ascending aorta and aortic arch. Conventional 3 vessel aortic branching pattern. The visualized proximal subclavian arteries are widely patent. RIGHT CAROTID SYSTEM: --Common carotid artery: Widely patent origin without common carotid artery dissection or aneurysm. There is bulky calcification of the carotid bifurcation. --Internal carotid artery: There is approximately 80% stenosis of the proximal internal carotid artery secondary to predominantly calcified plaque. Normal distal ICA. --External carotid artery: No acute abnormality. LEFT CAROTID SYSTEM: --Common carotid artery: Widely patent origin without common carotid artery dissection or aneurysm. --Internal carotid artery:No dissection, occlusion or aneurysm. No hemodynamically significant stenosis. --External carotid artery: No acute abnormality. VERTEBRAL ARTERIES: Left dominant configuration. Both origins are normal. No dissection, occlusion or flow-limiting stenosis to the vertebrobasilar confluence. SKELETON: There is no bony spinal canal stenosis. No lytic or blastic lesion. Extensive dental disease. OTHER NECK: Normal pharynx, larynx and major salivary glands. No cervical lymphadenopathy. Unremarkable thyroid gland. UPPER CHEST: No pneumothorax or pleural effusion. No nodules or masses. CTA HEAD FINDINGS ANTERIOR CIRCULATION: --Intracranial internal carotid arteries: Moderate-to-severe stenosis of the lacerum segments of both internal carotid arteries. Multifocal calcification throughout the cavernous segments. --Anterior cerebral arteries: Normal. Absent right A1 segment, normal variant --Middle cerebral arteries: There is a short segment severe stenosis of the proximal M1 segment of the left MCA, which is otherwise widely patent. Normal right MCA. --Posterior communicating arteries: Present bilaterally. POSTERIOR CIRCULATION: --Basilar artery: Normal. --Posterior cerebral arteries: The right  posterior cerebral artery is normal. There is severe narrowing of the entire left posterior cerebral artery with suspected short segment occlusion of the P2 segment. --Superior cerebellar arteries: Normal. --Inferior cerebellar arteries: Normal anterior and posterior inferior cerebellar arteries. VENOUS SINUSES: As permitted by contrast timing, patent. ANATOMIC VARIANTS: None DELAYED PHASE: Not performed. Review of the MIP images confirms the above findings. CT Brain Perfusion Findings: CBF (<30%) Volume: 0 ML Perfusion (Tmax>6.0s) volume: 79m Mismatch Volume: 358mInfarction Location:Left posterior cerebral artery territory IMPRESSION: 1. Area of acute ischemia within the left posterior cerebral artery territory without core infarct by perfusion criteria. No acute hemorrhage. ASPECTS is 10. 2. Severe diffuse narrowing of the left posterior cerebral artery with possible short segment occlusion in the P2 segment. 3. Severe short segment stenosis of the proximal left MCA M1 segment, which is otherwise normal. 4. Approximately 80% stenosis of the proximal right internal carotid artery secondary to calcific atherosclerosis. 5. Moderate-to-severe stenosis of both internal carotid arteries at the skull base. These results were called by telephone at the time of interpretation on 05/21/2017 at 11:41 pm to Dr. BRNoemi Chapel who verbally acknowledged these results. Electronically Signed   By: KeUlyses Jarred.D.   On: 05/21/2017 23:55   Dg Chest 2 View  Result Date: 05/22/2017 CLINICAL DATA:  Cough for 1 week  EXAM: CHEST - 2 VIEW COMPARISON:  02/08/2017 FINDINGS: No focal airspace disease or pleural effusion. Stable cardiomediastinal silhouette with aortic atherosclerosis. No pneumothorax. IMPRESSION: No active cardiopulmonary disease. Electronically Signed   By: Donavan Foil M.D.   On: 05/22/2017 01:15   Ct Angio Neck W Or Wo Contrast  Result Date: 05/21/2017 CLINICAL DATA:  Left-sided weakness.  Dizziness. EXAM: CT  ANGIOGRAPHY HEAD AND NECK CT PERFUSION BRAIN TECHNIQUE: Multidetector CT imaging of the head and neck was performed using the standard protocol during bolus administration of intravenous contrast. Multiplanar CT image reconstructions and MIPs were obtained to evaluate the vascular anatomy. Carotid stenosis measurements (when applicable) are obtained utilizing NASCET criteria, using the distal internal carotid diameter as the denominator. Multiphase CT imaging of the brain was performed following IV bolus contrast injection. Subsequent parametric perfusion maps were calculated using RAPID software. CONTRAST:  122m ISOVUE-370 IOPAMIDOL (ISOVUE-370) INJECTION 76% COMPARISON:  Head CT 02/08/2017 FINDINGS: CT HEAD FINDINGS Brain: No mass lesion or acute hemorrhage. No focal hypoattenuation of the basal ganglia or cortex to indicate infarcted tissue. There is periventricular hypoattenuation compatible with chronic microvascular disease. There is generalized atrophy without lobar predilection. Unchanged appearance of old left caudate head lacunar infarct. Vascular: No hyperdense vessel. Atherosclerotic calcification of the internal carotid arteries at the skull base. Skull: Normal visualized skull base, calvarium and extracranial soft tissues. Sinuses/Orbits: No sinus fluid levels or advanced mucosal thickening. No mastoid effusion. Normal orbits. ASPECTS ([place] Early CT Score) - Ganglionic level infarction (caudate, lentiform nuclei, internal capsule, insula, M1-M3 cortex): 7 - Supraganglionic infarction (M4-M6 cortex): 3 Total score (0-10 with 10 being normal): 10 CTA NECK FINDINGS AORTIC ARCH: There is no calcific atherosclerosis of the aortic arch. There is no aneurysm, dissection or hemodynamically significant stenosis of the visualized ascending aorta and aortic arch. Conventional 3 vessel aortic branching pattern. The visualized proximal subclavian arteries are widely patent. RIGHT CAROTID SYSTEM:  --Common carotid artery: Widely patent origin without common carotid artery dissection or aneurysm. There is bulky calcification of the carotid bifurcation. --Internal carotid artery: There is approximately 80% stenosis of the proximal internal carotid artery secondary to predominantly calcified plaque. Normal distal ICA. --External carotid artery: No acute abnormality. LEFT CAROTID SYSTEM: --Common carotid artery: Widely patent origin without common carotid artery dissection or aneurysm. --Internal carotid artery:No dissection, occlusion or aneurysm. No hemodynamically significant stenosis. --External carotid artery: No acute abnormality. VERTEBRAL ARTERIES: Left dominant configuration. Both origins are normal. No dissection, occlusion or flow-limiting stenosis to the vertebrobasilar confluence. SKELETON: There is no bony spinal canal stenosis. No lytic or blastic lesion. Extensive dental disease. OTHER NECK: Normal pharynx, larynx and major salivary glands. No cervical lymphadenopathy. Unremarkable thyroid gland. UPPER CHEST: No pneumothorax or pleural effusion. No nodules or masses. CTA HEAD FINDINGS ANTERIOR CIRCULATION: --Intracranial internal carotid arteries: Moderate-to-severe stenosis of the lacerum segments of both internal carotid arteries. Multifocal calcification throughout the cavernous segments. --Anterior cerebral arteries: Normal. Absent right A1 segment, normal variant --Middle cerebral arteries: There is a short segment severe stenosis of the proximal M1 segment of the left MCA, which is otherwise widely patent. Normal right MCA. --Posterior communicating arteries: Present bilaterally. POSTERIOR CIRCULATION: --Basilar artery: Normal. --Posterior cerebral arteries: The right posterior cerebral artery is normal. There is severe narrowing of the entire left posterior cerebral artery with suspected short segment occlusion of the P2 segment. --Superior cerebellar arteries: Normal. --Inferior cerebellar  arteries: Normal anterior and posterior inferior cerebellar arteries. VENOUS SINUSES: As permitted by contrast timing,  patent. ANATOMIC VARIANTS: None DELAYED PHASE: Not performed. Review of the MIP images confirms the above findings. CT Brain Perfusion Findings: CBF (<30%) Volume: 0 ML Perfusion (Tmax>6.0s) volume: 91m Mismatch Volume: 353mInfarction Location:Left posterior cerebral artery territory IMPRESSION: 1. Area of acute ischemia within the left posterior cerebral artery territory without core infarct by perfusion criteria. No acute hemorrhage. ASPECTS is 10. 2. Severe diffuse narrowing of the left posterior cerebral artery with possible short segment occlusion in the P2 segment. 3. Severe short segment stenosis of the proximal left MCA M1 segment, which is otherwise normal. 4. Approximately 80% stenosis of the proximal right internal carotid artery secondary to calcific atherosclerosis. 5. Moderate-to-severe stenosis of both internal carotid arteries at the skull base. These results were called by telephone at the time of interpretation on 05/21/2017 at 11:41 pm to Dr. BRNoemi Chapel who verbally acknowledged these results. Electronically Signed   By: KeUlyses Jarred.D.   On: 05/21/2017 23:55   Mr Brain Wo Contrast  Result Date: 05/22/2017 CLINICAL DATA:  Stroke.  Weakness and dizziness EXAM: MRI HEAD WITHOUT CONTRAST MRA HEAD WITHOUT CONTRAST TECHNIQUE: Multiplanar, multiecho pulse sequences of the brain and surrounding structures were obtained without intravenous contrast. Angiographic images of the head were obtained using MRA technique without contrast. COMPARISON:  CTA and CT perfusion 05/21/2017 FINDINGS: MRI HEAD FINDINGS Brain: Acute infarct left occipital lobe. Multiple small subcentimeter areas of acute infarct in the left occipital white matter and cortex. Subcentimeter acute infarct in the high right medial parietal lobe. Mild atrophy. Moderate chronic microvascular ischemic change in the  white matter. Chronic ischemia in the pons. Negative for hemorrhage or mass. No midline shift. Vascular: Normal arterial flow voids. Skull and upper cervical spine: Negative Sinuses/Orbits: Paranasal sinuses clear.  Bilateral cataract surgery Other: None MRA HEAD FINDINGS Left vertebral artery dominant and supplies the basilar. Right vertebral artery ends in PICA. Left PICA patent. Basilar widely patent. Fetal origin of the right posterior cerebral artery with moderate stenosis proximally and distally. Markedly decreased signal left posterior cerebral artery. This vessel appears to be severely stenotic with decreased flow on CTA however could be occluded. Mild stenosis right cavernous carotid. Right middle cerebral artery patent with mild atherosclerotic disease. Right A1 segment hypoplastic Both anterior cerebral arteries are patent and supplied from the left. Severe stenosis right A2 segment Mild stenosis proximal left M1 segment. Moderate stenosis posterior division of left MCA. IMPRESSION: Several subcentimeter areas of acute infarct left occipital lobe. Subcentimeter acute infarct right medial parietal lobe Atrophy and moderate chronic microvascular ischemia throughout the white matter and pons Diffuse intracranial atherosclerotic disease. High-grade stenosis with slow flow versus occlusion of the left posterior cerebral artery. Electronically Signed   By: ChFranchot Gallo.D.   On: 05/22/2017 08:41   [place]arotid Bilateral (at [facility] And [facility] Only)  Result Date: 05/22/2017 CLINICAL DATA:  Acute CVA.  Vertigo.  Blurry vision. EXAM: BILATERAL CAROTID DUPLEX ULTRASOUND TECHNIQUE: GrPearline Cablescale imaging, color Doppler and duplex ultrasound were performed of bilateral carotid and vertebral arteries in the neck. COMPARISON:  None. FINDINGS: Criteria: Quantification of carotid stenosis is based on velocity parameters that correlate the residual internal carotid diameter with NASCET-based stenosis levels, using the diameter  of the distal internal carotid lumen as the denominator for stenosis measurement. The following velocity measurements were obtained: RIGHT ICA:  347 cm/sec CCA:  75 cm/sec SYSTOLIC ICA/CCA RATIO:  4.6 DIASTOLIC ICA/CCA RATIO:  5.2 ECA:  188 cm/sec LEFT ICA:  88 cm/sec  CCA:  67 cm/sec SYSTOLIC ICA/CCA RATIO:  1.3 DIASTOLIC ICA/CCA RATIO:  1.3 ECA:  47 cm/sec RIGHT CAROTID ARTERY: Advanced calcified plaque in the bulb. Low resistance internal carotid Doppler pattern is preserved. RIGHT VERTEBRAL ARTERY:  Antegrade. LEFT CAROTID ARTERY: Moderate irregular mixed calcified and soft plaque in the bulb. Low resistance Doppler pattern in the internal carotid is preserved. LEFT VERTEBRAL ARTERY:  Antegrade. IMPRESSION: Greater than 70% stenosis in the right internal carotid artery. Less than 50% stenosis in the left internal carotid artery. Electronically Signed   By: Marybelle Killings M.D.   On: 05/22/2017 09:04   Ct Cerebral Perfusion W Contrast  Result Date: 05/21/2017 CLINICAL DATA:  Left-sided weakness.  Dizziness. EXAM: CT ANGIOGRAPHY HEAD AND NECK CT PERFUSION BRAIN TECHNIQUE: Multidetector CT imaging of the head and neck was performed using the standard protocol during bolus administration of intravenous contrast. Multiplanar CT image reconstructions and MIPs were obtained to evaluate the vascular anatomy. Carotid stenosis measurements (when applicable) are obtained utilizing NASCET criteria, using the distal internal carotid diameter as the denominator. Multiphase CT imaging of the brain was performed following IV bolus contrast injection. Subsequent parametric perfusion maps were calculated using RAPID software. CONTRAST:  129m ISOVUE-370 IOPAMIDOL (ISOVUE-370) INJECTION 76% COMPARISON:  Head CT 02/08/2017 FINDINGS: CT HEAD FINDINGS Brain: No mass lesion or acute hemorrhage. No focal hypoattenuation of the basal ganglia or cortex to indicate infarcted tissue. There is periventricular hypoattenuation compatible with  chronic microvascular disease. There is generalized atrophy without lobar predilection. Unchanged appearance of old left caudate head lacunar infarct. Vascular: No hyperdense vessel. Atherosclerotic calcification of the internal carotid arteries at the skull base. Skull: Normal visualized skull base, calvarium and extracranial soft tissues. Sinuses/Orbits: No sinus fluid levels or advanced mucosal thickening. No mastoid effusion. Normal orbits. ASPECTS ([place] Score) - Ganglionic level infarction (caudate, lentiform nuclei, internal capsule, insula, M1-M3 cortex): 7 - Supraganglionic infarction (M4-M6 cortex): 3 Total score (0-10 with 10 being normal): 10 CTA NECK FINDINGS AORTIC ARCH: There is no calcific atherosclerosis of the aortic arch. There is no aneurysm, dissection or hemodynamically significant stenosis of the visualized ascending aorta and aortic arch. Conventional 3 vessel aortic branching pattern. The visualized proximal subclavian arteries are widely patent. RIGHT CAROTID SYSTEM: --Common carotid artery: Widely patent origin without common carotid artery dissection or aneurysm. There is bulky calcification of the carotid bifurcation. --Internal carotid artery: There is approximately 80% stenosis of the proximal internal carotid artery secondary to predominantly calcified plaque. Normal distal ICA. --External carotid artery: No acute abnormality. LEFT CAROTID SYSTEM: --Common carotid artery: Widely patent origin without common carotid artery dissection or aneurysm. --Internal carotid artery:No dissection, occlusion or aneurysm. No hemodynamically significant stenosis. --External carotid artery: No acute abnormality. VERTEBRAL ARTERIES: Left dominant configuration. Both origins are normal. No dissection, occlusion or flow-limiting stenosis to the vertebrobasilar confluence. SKELETON: There is no bony spinal canal stenosis. No lytic or blastic lesion. Extensive dental disease.  OTHER NECK: Normal pharynx, larynx and major salivary glands. No cervical lymphadenopathy. Unremarkable thyroid gland. UPPER CHEST: No pneumothorax or pleural effusion. No nodules or masses. CTA HEAD FINDINGS ANTERIOR CIRCULATION: --Intracranial internal carotid arteries: Moderate-to-severe stenosis of the lacerum segments of both internal carotid arteries. Multifocal calcification throughout the cavernous segments. --Anterior cerebral arteries: Normal. Absent right A1 segment, normal variant --Middle cerebral arteries: There is a short segment severe stenosis of the proximal M1 segment of the left MCA, which is otherwise widely patent. Normal right MCA. --Posterior communicating arteries: Present  bilaterally. POSTERIOR CIRCULATION: --Basilar artery: Normal. --Posterior cerebral arteries: The right posterior cerebral artery is normal. There is severe narrowing of the entire left posterior cerebral artery with suspected short segment occlusion of the P2 segment. --Superior cerebellar arteries: Normal. --Inferior cerebellar arteries: Normal anterior and posterior inferior cerebellar arteries. VENOUS SINUSES: As permitted by contrast timing, patent. ANATOMIC VARIANTS: None DELAYED PHASE: Not performed. Review of the MIP images confirms the above findings. CT Brain Perfusion Findings: CBF (<30%) Volume: 0 ML Perfusion (Tmax>6.0s) volume: 35m Mismatch Volume: 375mInfarction Location:Left posterior cerebral artery territory IMPRESSION: 1. Area of acute ischemia within the left posterior cerebral artery territory without core infarct by perfusion criteria. No acute hemorrhage. ASPECTS is 10. 2. Severe diffuse narrowing of the left posterior cerebral artery with possible short segment occlusion in the P2 segment. 3. Severe short segment stenosis of the proximal left MCA M1 segment, which is otherwise normal. 4. Approximately 80% stenosis of the proximal right internal carotid artery secondary to calcific atherosclerosis.  5. Moderate-to-severe stenosis of both internal carotid arteries at the skull base. These results were called by telephone at the time of interpretation on 05/21/2017 at 11:41 pm to Dr. BRNoemi Chapel who verbally acknowledged these results. Electronically Signed   By: KeUlyses Jarred.D.   On: 05/21/2017 23:55   Mr MrJodene Namead/brain WoEVm  Result Date: 05/22/2017 CLINICAL DATA:  Stroke.  Weakness and dizziness EXAM: MRI HEAD WITHOUT CONTRAST MRA HEAD WITHOUT CONTRAST TECHNIQUE: Multiplanar, multiecho pulse sequences of the brain and surrounding structures were obtained without intravenous contrast. Angiographic images of the head were obtained using MRA technique without contrast. COMPARISON:  CTA and CT perfusion 05/21/2017 FINDINGS: MRI HEAD FINDINGS Brain: Acute infarct left occipital lobe. Multiple small subcentimeter areas of acute infarct in the left occipital white matter and cortex. Subcentimeter acute infarct in the high right medial parietal lobe. Mild atrophy. Moderate chronic microvascular ischemic change in the white matter. Chronic ischemia in the pons. Negative for hemorrhage or mass. No midline shift. Vascular: Normal arterial flow voids. Skull and upper cervical spine: Negative Sinuses/Orbits: Paranasal sinuses clear.  Bilateral cataract surgery Other: None MRA HEAD FINDINGS Left vertebral artery dominant and supplies the basilar. Right vertebral artery ends in PICA. Left PICA patent. Basilar widely patent. Fetal origin of the right posterior cerebral artery with moderate stenosis proximally and distally. Markedly decreased signal left posterior cerebral artery. This vessel appears to be severely stenotic with decreased flow on CTA however could be occluded. Mild stenosis right cavernous carotid. Right middle cerebral artery patent with mild atherosclerotic disease. Right A1 segment hypoplastic Both anterior cerebral arteries are patent and supplied from the left. Severe stenosis right A2 segment  Mild stenosis proximal left M1 segment. Moderate stenosis posterior division of left MCA. IMPRESSION: Several subcentimeter areas of acute infarct left occipital lobe. Subcentimeter acute infarct right medial parietal lobe Atrophy and moderate chronic microvascular ischemia throughout the white matter and pons Diffuse intracranial atherosclerotic disease. High-grade stenosis with slow flow versus occlusion of the left posterior cerebral artery. Electronically Signed   By: ChFranchot Gallo.D.   On: 05/22/2017 08:41    Microbiology: No results found for this or any previous visit (from the past 240 hour(s)).   Labs: Basic Metabolic Panel: Recent Labs  Lab 05/21/17 2217 05/21/17 2239 05/22/17 0515 05/23/17 0557  NA 139 138 136 137  K 3.3* 3.2* 3.6 3.5  CL 97* 98* 97* 99*  CO2 24  --  26 26  GLUCOSE 115*  113* 121* 140*  BUN 33* 29* 30* 31*  CREATININE 2.00* 1.90* 1.78* 1.96*  CALCIUM 9.1  --  8.8* 9.0  MG 1.9  --   --   --   PHOS 4.9*  --   --   --    Liver Function Tests: Recent Labs  Lab 05/22/17 0515  AST 65*  ALT 39  ALKPHOS 50  BILITOT 0.8  PROT 7.5  ALBUMIN 3.3*   No results for input(s): LIPASE, AMYLASE in the last 168 hours. No results for input(s): AMMONIA in the last 168 hours. CBC: Recent Labs  Lab 05/21/17 2217 05/21/17 2239 05/23/17 0557  WBC 8.3  --  9.6  NEUTROABS 2.9  --  3.3  HGB 12.0 13.3 13.7  HCT 37.8 39.0 43.1  MCV 87.9  --  89.0  PLT 277  --  318   Cardiac Enzymes: No results for input(s): CKTOTAL, CKMB, CKMBINDEX, TROPONINI in the last 168 hours. BNP: BNP (last 3 results) No results for input(s): BNP in the last 8760 hours.  ProBNP (last 3 results) No results for input(s): PROBNP in the last 8760 hours.  CBG: Recent Labs  Lab 05/22/17 1115 05/22/17 1558 05/22/17 2128 05/23/17 0737 05/23/17 1139  GLUCAP 184* 106* 174* 180* 97       Signed:  Lelon Frohlich  [facility] Pager: [phone] 05/23/2017, 2:13  PM

## 2017-05-23 NOTE — Progress Notes (Signed)
Patient is to be discharged home and in stable condition. Patient's IV and telemetry removed, WNL. Patient given discharge instruction and verbalized understanding. Patient will be escorted out by staff via wheelchair.  Celestia Khat, RN

## 2017-05-23 NOTE — Progress Notes (Signed)
Physical Therapy Treatment Patient Details Name: Cassandra Jordan MRN: [id number] DOB: 01-17-1951 Today's Date: 05/23/2017    History of Present Illness Cassandra Jordan is a 67 y.o. female with medical history significant of alcohol abuse, arthritis, asthma, history of cocaine abuse, essential hypertension, GERD, hepatitis C, hyperlipidemia, neuropathic pain history, history of noncompliance, paroxysmal SVT, renal cell carcinoma, type 2 diabetes who is coming due to generalized weakness for about an hour prior to arrival to the ED.    PT Comments    Patient demonstrates good return for use of straight cane demonstrating mostly 2 point gait pattern without loss of balance, c/o minor back discomfort during bed mobility, tolerated sitting up at bedside to complete BLE exercises and requested to go back to bed after therapy due to sleepy and states she is mostly a day sleeper and has difficulty sleeping at night.  Patient will benefit from continued physical therapy in hospital and recommended venue below to increase strength, balance, endurance for safe ADLs and gait.    Follow Up Recommendations  Home health PT     Equipment Recommendations  Cane    Recommendations for Other Services       Precautions / Restrictions Precautions Precautions: Fall Restrictions Weight Bearing Restrictions: No    Mobility  Bed Mobility Overal bed mobility: Modified Independent                Transfers Overall transfer level: Modified independent Equipment used: Straight cane Transfers: Sit to/from Stand;Stand Pivot Transfers Sit to Stand: Supervision Stand pivot transfers: Supervision          Ambulation/Gait Ambulation/Gait assistance: Supervision Ambulation Distance (Feet): 120 Feet Assistive device: Straight cane Gait Pattern/deviations: Step-through pattern Gait velocity: decreased   General Gait Details: slightly labored slow cadence demonstrating mostly 2 point gait pattern  using straight cane without loss of balance   Stairs             Wheelchair Mobility    Modified Rankin (Stroke Patients Only)       Balance Overall balance assessment: Mild deficits observed, not formally tested                                          Cognition Arousal/Alertness: Awake/alert Behavior During Therapy: WFL for tasks assessed/performed Overall Cognitive Status: Within Functional Limits for tasks assessed                                        Exercises General Exercises - Lower Extremity Long Arc Quad: Seated;AROM;Strengthening;Both;10 reps Hip Flexion/Marching: Seated;AROM;Strengthening;Both;10 reps Toe Raises: Seated;AROM;Strengthening;Both;10 reps Heel Raises: Seated;AROM;Strengthening;Both;10 reps    General Comments        Pertinent Vitals/Pain Pain Assessment: 0-10 Pain Score: 6  Pain Location: low to mid back Pain Descriptors / Indicators: Aching    Home Living                      Prior Function            PT Goals (current goals can now be found in the care plan section) Acute Rehab PT Goals Patient Stated Goal: return home PT Goal Formulation: With patient Time For Goal Achievement: 05/25/17 Potential to Achieve Goals: Good Progress towards PT goals: Progressing toward goals  Frequency    7X/week      PT Plan Current plan remains appropriate    Co-evaluation              AM-PAC PT "6 Clicks" Daily Activity  Outcome Measure  Difficulty turning over in bed (including adjusting bedclothes, sheets and blankets)?: None Difficulty moving from lying on back to sitting on the side of the bed? : None Difficulty sitting down on and standing up from a chair with arms (e.g., wheelchair, bedside commode, etc,.)?: None Help needed moving to and from a bed to chair (including a wheelchair)?: None Help needed walking in hospital room?: A Little Help needed climbing 3-5 steps  with a railing? : A Little 6 Click Score: 22    End of Session   Activity Tolerance: Patient tolerated treatment well;Patient limited by fatigue Patient left: in bed;with call bell/phone within reach;with bed alarm set Nurse Communication: Mobility status PT Visit Diagnosis: Unsteadiness on feet (R26.81);Other abnormalities of gait and mobility (R26.89);Muscle weakness (generalized) (M62.81)     Time: 5830-7460 PT Time Calculation (min) (ACUTE ONLY): 27 min  Charges:  $Gait Training: 8-22 mins $Therapeutic Exercise: 8-22 mins                    G Codes:       12:00 PM, Jun 21, 2017 Lonell Grandchild, MPT Physical Therapist with [facility] [phone] office [phone] mobile phone

## 2017-05-23 NOTE — Care Management Note (Signed)
Case Management Note  Patient Details  Name: Cassandra Jordan MRN: [id number] Date of Birth: [birth year]   Expected Discharge Date:  05/23/17               Expected Discharge Plan:  [place]  In-House Referral:  NA  Discharge planning Services  CM Consult  Post Acute Care Choice:  Home Health, Durable Medical Equipment Choice offered to:  Patient  DME Arranged:  [name] DME Agency:  [place] Arranged:  PT, RN, Speech Therapy, Social Work [facility] Agency:  [place]  Status of Service:  Completed, signed off  Additional Comments: DC home today. [facility] rep, aware of DC and will pull orders from chart. [name] has been delivered to room.   Sherald Barge, RN 05/23/2017, 2:17 PM

## 2017-05-23 NOTE — Progress Notes (Signed)
*  PRELIMINARY RESULTS* Echocardiogram 2D Echocardiogram has been performed.  Leavy Cella 05/23/2017, 2:00 PM

## 2017-05-28 ENCOUNTER — Ambulatory Visit (INDEPENDENT_AMBULATORY_CARE_PROVIDER_SITE_OTHER): Payer: Medicare HMO

## 2017-05-28 ENCOUNTER — Telehealth: Payer: Self-pay | Admitting: Cardiology

## 2017-05-28 DIAGNOSIS — I499 Cardiac arrhythmia, unspecified: Secondary | ICD-10-CM

## 2017-05-28 NOTE — Telephone Encounter (Signed)
Needs RX or Pacerone sent to [facility]  tg

## 2017-05-28 NOTE — Progress Notes (Signed)
Cardiology Office Note  Date: 05/31/2017   ID: Cassandra Jordan, DOB October 19, 1950, MRN [id number]  PCP: [facility]  Primary Cardiologist: Rozann Lesches, MD   Chief Complaint  Patient presents with  . Hospitalization Follow-up    History of Present Illness: Cassandra Jordan is a 67 y.o. female not seen since October 2017.  She was just recently evaluated on the hospitalist team at [facility] with an acute stroke, brain MRI showing several small areas of infarct involving the left occipital lobe and subcentimeter infarct in the right medial parietal lobe.  She had no documented atrial arrhythmias during observation.  Carotid Doppler showed greater than 70% RICA stenosis and less than 14% LICA stenosis.  She was treated with aspirin and Lipitor, set up to have an outpatient 30-day event recorder through our office.  Case was also discussed with [facility] (Dr. Donzetta Matters) who recommended outpatient follow-up of her carotids.  She presents for follow-up.  States that she has had no chest pain or palpitations.  She is wearing a heart monitor at this time.  Reports no motor weakness or speech problems.  We have followed her for history of recurrent PSVT requiring low-dose amiodarone for suppression.  Unfortunately, her follow-up has not been consistent.  I reviewed her recent ECG and echocardiogram as outlined below.  She has history of polysubstance abuse as outlined below.  I talked with her about this today.  She states that she is trying her best to quit completely and may consider entering a rehabilitation program.  Past Medical History:  Diagnosis Date  . Alcohol abuse   . Arthritis   . Asthma   . Cocaine abuse ([place])   . Essential hypertension   . GERD (gastroesophageal reflux disease)   . Hepatitis C   . Hyperlipidemia   . Lichen sclerosus et atrophicus 10/13/2014  . Neuropathic pain   . Noncompliance   . PSVT (paroxysmal supraventricular tachycardia) ([place])   .  Rash   . Renal carcinoma ([place])   . Type 2 diabetes mellitus ([place])   . Vaginal irritation 10/01/2014  . Vaginal itching 10/01/2014  . Yeast infection of the vagina 10/01/2014    Past Surgical History:  Procedure Laterality Date  . ABDOMINAL HYSTERECTOMY    . COLONOSCOPY N/A 07/08/2013   RMR: Multiple colonic polyps treated [name] as described above. Inadequate prepartation comprimised examination there colonic diverticulosis. tubular and tubulovillous adenomas. next TCS 12/2013  . COLONOSCOPY N/A 02/09/2015   RMR: Colonic polyp removed as described above. Tubular adenoma. Next colonoscopy January 2022.  . ESOPHAGOGASTRODUODENOSCOPY N/A 07/08/2013   RMR: Schatzki's ring ; small hiatal hernia-status post passage of a Maloney dilator  . MALONEY DILATION N/A 07/08/2013   Procedure: [name] DILATION;  Surgeon: [name], MD;  Location: [facility] SU[facility];  Service: Endoscopy;  Laterality: N/A;  . ROBOT ASSISTED LAPAROSCOPIC NEPHRECTOMY Right 10/14/2013   Procedure: ROBOTIC ASSISTED LAPAROSCOPIC RADICAL NEPHRECTOMY;  Surgeon: [name], MD;  Location: [facility] ORS;  Service: Urology;  Laterality: Right;  . SAVORY DILATION N/A 07/08/2013   Procedure: SAVORY DILATION;  Surgeon: [name], MD;  Location: [facility] END[facility] SUITE;  Service: Endoscopy;  Laterality: N/A;  . TONSILLECTOMY    . Wart removal      Current Outpatient Medications  Medication Sig Dispense Refill  . amiodarone (PACERONE) 100 MG tablet Take 1 tablet (100 mg total) by mouth daily. TAKE ONE TABLET BY MOUTH ONCE DAILY 30 tablet 0  . amLODipine (  NORVASC) 10 MG tablet Take 1 tablet (10 mg total) by mouth daily. 30 tablet 1  . aspirin EC 81 MG EC tablet Take 1 tablet (81 mg total) by mouth daily.    Marland Kitchen atorvastatin (LIPITOR) 40 MG tablet Take 1 tablet (40 mg total) by mouth daily at 6 PM. 30 tablet 2  . carvedilol (COREG) 3.125 MG tablet Take 1 tablet (3.125 mg total) by mouth 2 (two) times daily with a meal. 60 tablet 1  . gabapentin  (NEURONTIN) 600 MG tablet Take 1 tablet by mouth 3 (three) times daily.  0  . insulin detemir (LEVEMIR) 100 UNIT/ML injection Inject 0.1 mLs (10 Units total) into the skin daily. 10 mL 11  . lisinopril-hydrochlorothiazide (PRINZIDE,ZESTORETIC) 20-25 MG tablet Take 1 tablet by mouth 2 (two) times daily.    . pantoprazole (PROTONIX) 40 MG tablet Take 1 tablet (40 mg total) by mouth daily. Take 30 minutes before breakfast 90 tablet 3   No current facility-administered medications for this visit.    Allergies:  Sulfa antibiotics   Social History: The patient  reports that she has been smoking cigarettes.  She has a 4.00 pack-year smoking history. She has never used smokeless tobacco. She reports that she has current or past drug history. Drugs: Marijuana and Cocaine. She reports that she does not drink alcohol.   Family History: The patient's family history includes Cancer in her maternal grandfather and mother; Colon cancer (age of onset: 91) in her sister; Diabetes in her paternal grandmother; Heart attack in her father; Hypertension in her daughter.   ROS:  Please see the history of present illness. Otherwise, complete review of systems is positive for none.  All other systems are reviewed and negative.   Physical Exam: VS:  BP (!) 160/82 (BP Location: Right Arm)   Pulse 83   Ht 5' (1.524 m)   Wt 145 lb (65.8 kg)   SpO2 95%   BMI 28.32 kg/m , BMI Body mass index is 28.32 kg/m.  Wt Readings from Last 3 Encounters:  05/31/17 145 lb (65.8 kg)  05/22/17 138 lb 10.7 oz (62.9 kg)  02/08/17 138 lb (62.6 kg)    General: Patient appears comfortable at rest. HEENT: Conjunctiva and lids normal, oropharynx clear. Neck: Supple, no elevated JVP, right carotid bruit, no thyromegaly. Lungs: Clear to auscultation, nonlabored breathing at rest. Cardiac: Regular rate and rhythm, no S3, 2/6 systolic murmur, no pericardial rub. Abdomen: Soft, nontender, bowel sounds present, no guarding or  rebound. Extremities: No pitting edema, distal pulses 2+. Skin: Warm and dry. Musculoskeletal: No kyphosis. Neuropsychiatric: Alert and oriented x3, affect grossly appropriate.  ECG: I personally reviewed the tracing from 05/22/2017 which shows sinus rhythm with increased voltage and borderline prolonged QT interval.  Recent Labwork: 05/21/2017: Magnesium 1.9 05/22/2017: ALT 39; AST 65 05/23/2017: BUN 31; Creatinine, Ser 1.96; Hemoglobin 13.7; Platelets 318; Potassium 3.5; Sodium 137     Component Value Date/Time   CHOL 292 (H) 05/22/2017 0515   TRIG 159 (H) 05/22/2017 0515   HDL 64 05/22/2017 0515   CHOLHDL 4.6 05/22/2017 0515   VLDL 32 05/22/2017 0515   LDLCALC 196 (H) 05/22/2017 0515    Other Studies Reviewed Today:  Echocardiogram 05/23/2017: Study Conclusions  - Left ventricle: The cavity size was normal. Systolic function was   normal. The estimated ejection fraction was in the range of 60%   to 65%. Wall motion was normal; there were no regional wall   motion abnormalities. Findings consistent with  left ventricular   diastolic dysfunction, grade indeterminate. Doppler parameters   are consistent with high ventricular filling pressure. Mild   concentric and moderate focal basal septal hypertrophy. - Aortic valve: Severely calcified annulus. Trileaflet. - Mitral valve: There was mild regurgitation. - Atrial septum: No defect or patent foramen ovale was identified.  Assessment and Plan:  1.  Patient status post recent stroke, brain MRI showing several small areas of infarct involving the left occipital lobe and subcentimeter infarct in the right medial parietal lobe.  She reports no obvious residua at this time.  Currently on aspirin and Lipitor.  30-day event monitor is in place to assess for atrial arrhythmia.  LVEF normal range without obvious PFO.  2.  Carotid artery disease, greater than 70% RICA stenosis and less than 83% LICA stenosis. Based on chart review, case was  discussed with Dr. Donzetta Matters with [facility] who recommended outpatient follow-up.  This will be arranged.  Continue aspirin and statin.  3.  History of recurrent PSVT, fairly well suppressed on low-dose amiodarone.  I reviewed her recent ECG and lab work.  Plan to check TSH and LFTs for her next visit.  Recent chest x-ray unremarkable.  4.  Polysubstance abuse.  PCP is in [place].  I talked with her today about considering a rehabilitation program.  5.  Essential hypertension.  Patient states that she is taking her medications as directed.  No changes made today.  Current medicines were reviewed with the patient today.   Orders Placed This Encounter  Procedures  . TSH  . Hepatic function panel  . Ambulatory referral to Vascular Surgery    Disposition: Follow up in 1 month.  Signed, Satira Sark, MD, [facility] 05/31/2017 9:39 AM    [place] [facility] [place]. [address] [place], [place] [address] Phone: [phone]; Fax: [phone]

## 2017-05-29 ENCOUNTER — Other Ambulatory Visit: Payer: Self-pay

## 2017-05-29 MED ORDER — AMIODARONE HCL 100 MG PO TABS: 100.0000 mg | ORAL_TABLET | Freq: Every day | ORAL | 0 refills | Status: DC

## 2017-05-29 NOTE — Telephone Encounter (Signed)
Sent 30 day supply to [facility] in [place].

## 2017-05-30 ENCOUNTER — Other Ambulatory Visit: Payer: Self-pay

## 2017-05-30 DIAGNOSIS — I499 Cardiac arrhythmia, unspecified: Secondary | ICD-10-CM

## 2017-05-31 ENCOUNTER — Ambulatory Visit (INDEPENDENT_AMBULATORY_CARE_PROVIDER_SITE_OTHER): Payer: Medicare HMO | Admitting: Cardiology

## 2017-05-31 ENCOUNTER — Encounter: Payer: Self-pay | Admitting: Cardiology

## 2017-05-31 VITALS — BP 160/82 | HR 83 | Ht 60.0 in | Wt 145.0 lb

## 2017-05-31 DIAGNOSIS — Z8673 Personal history of transient ischemic attack (TIA), and cerebral infarction without residual deficits: Secondary | ICD-10-CM

## 2017-05-31 DIAGNOSIS — Z9229 Personal history of other drug therapy: Secondary | ICD-10-CM | POA: Diagnosis not present

## 2017-05-31 DIAGNOSIS — I471 Supraventricular tachycardia: Secondary | ICD-10-CM

## 2017-05-31 DIAGNOSIS — I6523 Occlusion and stenosis of bilateral carotid arteries: Secondary | ICD-10-CM | POA: Diagnosis not present

## 2017-05-31 DIAGNOSIS — F1910 Other psychoactive substance abuse, uncomplicated: Secondary | ICD-10-CM | POA: Diagnosis not present

## 2017-05-31 DIAGNOSIS — I10 Essential (primary) hypertension: Secondary | ICD-10-CM

## 2017-05-31 MED ORDER — AMIODARONE HCL 100 MG PO TABS: 100.0000 mg | ORAL_TABLET | Freq: Every day | ORAL | 1 refills | Status: DC

## 2017-05-31 NOTE — Patient Instructions (Addendum)
Your physician wants you to follow-up in: 1 month with Dr.McDowell      Your physician recommends that you continue on your current medications as directed. Please refer to the Current Medication list given to you today.   If you need a refill on your cardiac medications before your next appointment, please call your pharmacy.  BEFORE NEXT VISIT: get lab work, TSH, LFT's   You have been referred to Dr Donzetta Matters at [facility],  [phone]     Thank you for choosing [facility] !

## 2017-05-31 NOTE — Addendum Note (Signed)
Addended by: Barbarann Ehlers A on: 05/31/2017 09:44 AM   Modules accepted: Orders

## 2017-07-05 ENCOUNTER — Encounter: Payer: Medicare HMO | Admitting: Vascular Surgery

## 2017-07-16 ENCOUNTER — Ambulatory Visit: Payer: Medicare HMO | Admitting: Cardiology

## 2017-07-24 ENCOUNTER — Encounter: Payer: Medicare HMO | Admitting: Vascular Surgery

## 2017-09-04 NOTE — Progress Notes (Signed)
Cardiology Office Note  Date: 09/05/2017   ID: Cassandra Jordan, DOB [birth year], MRN [id number]  PCP: [facility]  Primary Cardiologist: Rozann Lesches, MD   Chief Complaint  Patient presents with  . Cardiac follow-up    History of Present Illness: Cassandra Jordan is a 67 y.o. female last seen in May.  She presents today for a routine scheduled visit, although was seen in the ER this morning by Dr. Sabra Heck, presented with epigastric discomfort and weakness.  No specific mention of chest pain or palpitations.  Basic lab work and ECG obtained, I reviewed the tracing which showed no acute ST segment changes.  She was found to be hypokalemic and given repletion.  She also unfortunately continues to use cocaine, admits this freely to me today.  UDS was positive.  We have discussed complete cessation, I have asked her to talk with her PCP about considering a rehabilitation program.  Event monitor from May did not demonstrate any obvious atrial arrhythmias.  I reviewed her lab work, outlined below.  Past Medical History:  Diagnosis Date  . Alcohol abuse   . Arthritis   . Asthma   . Cocaine abuse ([place])   . Essential hypertension   . GERD (gastroesophageal reflux disease)   . Hepatitis C   . Hyperlipidemia   . Lichen sclerosus et atrophicus 10/13/2014  . Neuropathic pain   . Noncompliance   . PSVT (paroxysmal supraventricular tachycardia) ([place])   . Rash   . Renal carcinoma ([place])   . Type 2 diabetes mellitus ([place])   . Vaginal irritation 10/01/2014  . Vaginal itching 10/01/2014  . Yeast infection of the vagina 10/01/2014    Past Surgical History:  Procedure Laterality Date  . ABDOMINAL HYSTERECTOMY    . COLONOSCOPY N/A 07/08/2013   RMR: Multiple colonic polyps treated Cassandra Jordan as described above. Inadequate prepartation comprimised examination there colonic diverticulosis. tubular and tubulovillous adenomas. next TCS 12/2013  . COLONOSCOPY N/A 02/09/2015   RMR: Colonic polyp removed as described above. Tubular adenoma. Next colonoscopy January 2022.  . ESOPHAGOGASTRODUODENOSCOPY N/A 07/08/2013   RMR: Schatzki's ring ; small hiatal hernia-status post passage of a Maloney dilator  . MALONEY DILATION N/A 07/08/2013   Procedure: [name] DILATION;  Surgeon: [name], MD;  [facility];  Service: Endoscopy;  Laterality: N/A;  . ROBOT ASSISTED LAPAROSCOPIC NEPHRECTOMY Right 10/14/2013   Procedure: ROBOTIC ASSISTED LAPAROSCOPIC RADICAL NEPHRECTOMY;  Surgeon: [name], MD;  Location: [facility];  Service: Urology;  Laterality: Right;  . SAVORY DILATION N/A 07/08/2013   Procedure: SAVORY DILATION;  Surgeon: [name], MD;  [facility];  Service: Endoscopy;  Laterality: N/A;  . TONSILLECTOMY    . Wart removal      Current Outpatient Medications  Medication Sig Dispense Refill  . amiodarone (PACERONE) 100 MG tablet Take 1 tablet (100 mg total) by mouth daily. TAKE ONE TABLET BY MOUTH ONCE DAILY 90 tablet 1  . amLODipine (NORVASC) 10 MG tablet Take 1 tablet (10 mg total) by mouth daily. 30 tablet 1  . aspirin EC 81 MG EC tablet Take 1 tablet (81 mg total) by mouth daily.    Marland Kitchen atorvastatin (LIPITOR) 40 MG tablet Take 1 tablet (40 mg total) by mouth daily at 6 PM. 30 tablet 2  . carvedilol (COREG) 6.25 MG tablet Take 6.25 mg by mouth 2 (two) times daily.  0  . cloNIDine (CATAPRES) 0.2 MG tablet Take 0.2 mg  by mouth 2 (two) times daily.  0  . gabapentin (NEURONTIN) 600 MG tablet Take 1 tablet by mouth 3 (three) times daily.  0  . insulin detemir (LEVEMIR) 100 UNIT/ML injection Inject 0.1 mLs (10 Units total) into the skin daily. 10 mL 11  . LANTUS 100 UNIT/ML injection INJECT 9 UNITS UNDER THE SKIN ONCE DAILY  6  . lisinopril-hydrochlorothiazide (PRINZIDE,ZESTORETIC) 20-25 MG tablet Take 1 tablet by mouth 2 (two) times daily.    . pantoprazole (PROTONIX) 40 MG tablet Take 1 tablet (40 mg total) by mouth daily. Take 30 minutes  before breakfast 90 tablet 3  . potassium chloride (K-DUR) 10 MEQ tablet Take 1 tablet (10 mEq total) by mouth daily. 7 tablet 0   No current facility-administered medications for this visit.    Allergies:  Sulfa antibiotics   Social History: The patient  reports that she has been smoking cigarettes. She has a 4.00 pack-year smoking history. She has never used smokeless tobacco. She reports that she has current or past drug history. Drugs: Marijuana and Cocaine. She reports that she does not drink alcohol.   ROS:  Please see the history of present illness. Otherwise, complete review of systems is positive for weakness.  All other systems are reviewed and negative.   Physical Exam: VS:  BP 132/76 (BP Location: Right Arm)   Pulse 67   Ht 5' 2"  (1.575 m)   Wt 140 lb (63.5 kg)   SpO2 93%   BMI 25.61 kg/m , BMI Body mass index is 25.61 kg/m.  Wt Readings from Last 3 Encounters:  09/05/17 140 lb (63.5 kg)  09/05/17 140 lb (63.5 kg)  05/31/17 145 lb (65.8 kg)    General: Patient appears comfortable at rest. HEENT: Conjunctiva and lids normal, oropharynx clear. Neck: Supple, no elevated JVP or carotid bruits, no thyromegaly. Lungs: Clear to auscultation, nonlabored breathing at rest. Cardiac: Regular rate and rhythm, no S3, 2/6 systolic murmur. Abdomen: Soft, nontender, bowel sounds present. Extremities: No pitting edema, distal pulses 2+. Skin: Warm and dry. Musculoskeletal: No kyphosis. Neuropsychiatric: Alert and oriented x3, affect grossly appropriate.  ECG: I personally reviewed the tracing from 05/22/2017 which shows sinus rhythm with increased voltage and borderline prolonged QT interval.  Recent Labwork: 05/21/2017: Magnesium 1.9 09/05/2017: ALT 36; AST 49; BUN 27; Creatinine, Ser 1.23; Hemoglobin 12.7; Platelets 258; Potassium 2.9; Sodium 137     Component Value Date/Time   CHOL 292 (H) 05/22/2017 0515   TRIG 159 (H) 05/22/2017 0515   HDL 64 05/22/2017 0515   CHOLHDL 4.6  05/22/2017 0515   VLDL 32 05/22/2017 0515   LDLCALC 196 (H) 05/22/2017 0515    Other Studies Reviewed Today:  Echocardiogram 05/23/2017: Study Conclusions  - Left ventricle: The cavity size was normal. Systolic function was normal. The estimated ejection fraction was in the range of 60% to 65%. Wall motion was normal; there were no regional wall motion abnormalities. Findings consistent with left ventricular diastolic dysfunction, grade indeterminate. Doppler parameters are consistent with high ventricular filling pressure. Mild concentric and moderate focal basal septal hypertrophy. - Aortic valve: Severely calcified annulus. Trileaflet. - Mitral valve: There was mild regurgitation. - Atrial septum: No defect or patent foramen ovale was identified.  30-day event monitor 05/30/2017: Representative strips from 30-day event monitor reviewed.  Sinus rhythm was present, there were only 2 reported events.  Heart rate ranged from 58 bpm up to 112 bpm with average heart rate 76 bpm.  No obvious arrhythmias or pauses.  Assessment and Plan:  1.  History of PSVT.  No reported recent palpitations or chest pain.  She continues on low-dose amiodarone for rhythm suppression, also Coreg.  I reviewed her recent LFTs, plan to check TSH for her next visit.  2.  Moderate internal carotid artery disease based on carotid Dopplers in May.  Follow-up was scheduled with [facility] to see Dr. Donzetta Matters.  Patient has not presented as yet.  3.  Polysubstance abuse, continues to use cocaine.  I talked with her about the critical importance of complete cessation of illicit substances.  She is to talk with her PCP about rehabilitation referral if possible.  4.  History of stroke by brain MRI, subsequent cardiac monitor did not demonstrate any atrial arrhythmias to require anticoagulation.  Current medicines were reviewed with the patient today.   Orders Placed This Encounter  Procedures  . TSH     Disposition: Follow-up in 3 months.  Signed, Satira Sark, MD, [facility] 09/05/2017 11:50 AM    [facility]. [address], [place], [place] [address] Phone: [phone]; Fax: [phone]

## 2017-09-05 ENCOUNTER — Ambulatory Visit (INDEPENDENT_AMBULATORY_CARE_PROVIDER_SITE_OTHER): Payer: Medicare HMO | Admitting: Cardiology

## 2017-09-05 ENCOUNTER — Emergency Department (HOSPITAL_COMMUNITY)
Admission: EM | Admit: 2017-09-05 | Discharge: 2017-09-05 | Disposition: A | Discharge status: Home / Self Care | Payer: Medicare HMO | Attending: Emergency Medicine | Admitting: Emergency Medicine

## 2017-09-05 ENCOUNTER — Encounter: Payer: Self-pay | Admitting: Cardiology

## 2017-09-05 ENCOUNTER — Other Ambulatory Visit: Payer: Self-pay

## 2017-09-05 ENCOUNTER — Encounter (HOSPITAL_COMMUNITY): Payer: Self-pay | Admitting: Emergency Medicine

## 2017-09-05 VITALS — BP 132/76 | HR 67 | Ht 62.0 in | Wt 140.0 lb

## 2017-09-05 DIAGNOSIS — I129 Hypertensive chronic kidney disease with stage 1 through stage 4 chronic kidney disease, or unspecified chronic kidney disease: Secondary | ICD-10-CM | POA: Diagnosis not present

## 2017-09-05 DIAGNOSIS — J45909 Unspecified asthma, uncomplicated: Secondary | ICD-10-CM | POA: Diagnosis not present

## 2017-09-05 DIAGNOSIS — I471 Supraventricular tachycardia, unspecified: Secondary | ICD-10-CM

## 2017-09-05 DIAGNOSIS — Z7982 Long term (current) use of aspirin: Secondary | ICD-10-CM | POA: Insufficient documentation

## 2017-09-05 DIAGNOSIS — E876 Hypokalemia: Secondary | ICD-10-CM | POA: Diagnosis not present

## 2017-09-05 DIAGNOSIS — I6523 Occlusion and stenosis of bilateral carotid arteries: Secondary | ICD-10-CM

## 2017-09-05 DIAGNOSIS — Z794 Long term (current) use of insulin: Secondary | ICD-10-CM | POA: Diagnosis not present

## 2017-09-05 DIAGNOSIS — Z79899 Other long term (current) drug therapy: Secondary | ICD-10-CM | POA: Insufficient documentation

## 2017-09-05 DIAGNOSIS — F17210 Nicotine dependence, cigarettes, uncomplicated: Secondary | ICD-10-CM | POA: Insufficient documentation

## 2017-09-05 DIAGNOSIS — N183 Chronic kidney disease, stage 3 (moderate): Secondary | ICD-10-CM | POA: Insufficient documentation

## 2017-09-05 DIAGNOSIS — E1122 Type 2 diabetes mellitus with diabetic chronic kidney disease: Secondary | ICD-10-CM | POA: Diagnosis not present

## 2017-09-05 DIAGNOSIS — F1910 Other psychoactive substance abuse, uncomplicated: Secondary | ICD-10-CM | POA: Diagnosis not present

## 2017-09-05 DIAGNOSIS — R531 Weakness: Secondary | ICD-10-CM | POA: Diagnosis present

## 2017-09-05 DIAGNOSIS — Z8673 Personal history of transient ischemic attack (TIA), and cerebral infarction without residual deficits: Secondary | ICD-10-CM | POA: Diagnosis not present

## 2017-09-05 DIAGNOSIS — R1013 Epigastric pain: Secondary | ICD-10-CM | POA: Diagnosis not present

## 2017-09-05 LAB — CBC WITH DIFFERENTIAL/PLATELET
Basophils Absolute: 0 10*3/uL (ref 0.0–0.1)
Basophils Relative: 0 %
EOS ABS: 0.4 10*3/uL (ref 0.0–0.7)
EOS PCT: 6 %
HCT: 39.6 % (ref 36.0–46.0)
Hemoglobin: 12.7 g/dL (ref 12.0–15.0)
LYMPHS ABS: 3.7 10*3/uL (ref 0.7–4.0)
Lymphocytes Relative: 46 %
MCH: 28.6 pg (ref 26.0–34.0)
MCHC: 32.1 g/dL (ref 30.0–36.0)
MCV: 89.2 fL (ref 78.0–100.0)
MONOS PCT: 7 %
Monocytes Absolute: 0.6 10*3/uL (ref 0.1–1.0)
Neutro Abs: 3.3 10*3/uL (ref 1.7–7.7)
Neutrophils Relative %: 41 %
PLATELETS: 258 10*3/uL (ref 150–400)
RBC: 4.44 MIL/uL (ref 3.87–5.11)
RDW: 13.1 % (ref 11.5–15.5)
WBC: 8 10*3/uL (ref 4.0–10.5)

## 2017-09-05 LAB — RAPID URINE DRUG SCREEN, HOSP PERFORMED
Amphetamines: NOT DETECTED
Barbiturates: NOT DETECTED
Benzodiazepines: NOT DETECTED
Cocaine: POSITIVE — AB
OPIATES: NOT DETECTED
TETRAHYDROCANNABINOL: NOT DETECTED

## 2017-09-05 LAB — URINALYSIS, ROUTINE W REFLEX MICROSCOPIC
BILIRUBIN URINE: NEGATIVE
GLUCOSE, UA: NEGATIVE mg/dL
Ketones, ur: NEGATIVE mg/dL
NITRITE: NEGATIVE
Protein, ur: 100 mg/dL — AB
SPECIFIC GRAVITY, URINE: 1.01 (ref 1.005–1.030)
pH: 7 (ref 5.0–8.0)

## 2017-09-05 LAB — COMPREHENSIVE METABOLIC PANEL
ALT: 36 U/L (ref 0–44)
ANION GAP: 9 (ref 5–15)
AST: 49 U/L — ABNORMAL HIGH (ref 15–41)
Albumin: 3.1 g/dL — ABNORMAL LOW (ref 3.5–5.0)
Alkaline Phosphatase: 48 U/L (ref 38–126)
BUN: 27 mg/dL — ABNORMAL HIGH (ref 8–23)
CALCIUM: 8.9 mg/dL (ref 8.9–10.3)
CHLORIDE: 100 mmol/L (ref 98–111)
CO2: 28 mmol/L (ref 22–32)
Creatinine, Ser: 1.23 mg/dL — ABNORMAL HIGH (ref 0.44–1.00)
GFR calc non Af Amer: 45 mL/min — ABNORMAL LOW (ref >60)
GFR, EST AFRICAN AMERICAN: 52 mL/min — AB (ref >60)
Glucose, Bld: 106 mg/dL — ABNORMAL HIGH (ref 70–99)
Potassium: 2.9 mmol/L — ABNORMAL LOW (ref 3.5–5.1)
SODIUM: 137 mmol/L (ref 135–145)
Total Bilirubin: 0.6 mg/dL (ref 0.3–1.2)
Total Protein: 7.2 g/dL (ref 6.5–8.1)

## 2017-09-05 LAB — ETHANOL

## 2017-09-05 LAB — LIPASE, BLOOD: Lipase: 26 U/L (ref 11–51)

## 2017-09-05 LAB — TROPONIN I

## 2017-09-05 MED ORDER — POTASSIUM CHLORIDE ER 10 MEQ PO TBCR: 10.0000 meq | EXTENDED_RELEASE_TABLET | Freq: Every day | ORAL | 0 refills | Status: DC

## 2017-09-05 MED ORDER — POTASSIUM CHLORIDE CRYS ER 20 MEQ PO TBCR
40.0000 meq | EXTENDED_RELEASE_TABLET | Freq: Once | ORAL | Status: AC
  Administered 2017-09-05: 40 meq via ORAL
  Filled 2017-09-05: qty 2

## 2017-09-05 NOTE — ED Provider Notes (Signed)
[facility] EMERGENCY DEPARTMENT Provider Note   CSN: [id number] Arrival date & time: 09/05/17  7517     History   Chief Complaint Chief Complaint  Patient presents with  . Weakness    HPI Cassandra Jordan is a 67 y.o. female.  HPI  The patient is a 67 year old female, she has a known history of alcohol abuse, cocaine abuse, hepatitis C but also has a history of paroxysmal SVT and a heart murmur.  She actually has an appointment scheduled with her heart doctor today for a reevaluation.  She has not been having any specific complaints but states that at 6:00 this morning when she woke up she was having epigastric pain, the seem to come on after eating to scoop subpoena butter off of the spoon.  No vomiting, no diarrhea, no blood in the stools, no dysuria.  Symptoms are persistent, nothing makes this better or worse, not associated with any fevers.  She states that she has had prior abdominal surgery including a partial hysterectomy as well as a nephrectomy for renal cell carcinoma.  As far she knows she states that her kidneys work otherwise well.  Review of the medical record shows that her last lab work was performed in May when her creatinine was 1.96.  Past Medical History:  Diagnosis Date  . Alcohol abuse   . Arthritis   . Asthma   . Cocaine abuse ([place])   . Essential hypertension   . GERD (gastroesophageal reflux disease)   . Hepatitis C   . Hyperlipidemia   . Lichen sclerosus et atrophicus 10/13/2014  . Neuropathic pain   . Noncompliance   . PSVT (paroxysmal supraventricular tachycardia) ([place])   . Rash   . Renal carcinoma ([place])   . Type 2 diabetes mellitus ([place])   . Vaginal irritation 10/01/2014  . Vaginal itching 10/01/2014  . Yeast infection of the vagina 10/01/2014    Patient Active Problem List   Diagnosis Date Noted  . Hyperphosphatemia 05/22/2017  . Acute CVA (cerebrovascular accident) ([place]) 05/21/2017  . Abdominal pain 11/18/2016  . Cholelithiasis 11/18/2016    . Uncontrolled type 2 diabetes mellitus with hyperglycemia, with long-term current use of insulin ([place]) 11/18/2016  . CKD (chronic kidney disease), stage III ([place]) 11/18/2016  . Acute pancreatitis   . Ileus ([place])   . Porcelain gallbladder   . Acute renal failure superimposed on stage 3 chronic kidney disease ([place]) 11/04/2016  . Hypokalemia 11/01/2016  . Hypomagnesemia 11/01/2016  . SBO (small bowel obstruction) ([place])   . AKI (acute kidney injury) ([place]) 10/30/2016  . Leukocytosis 10/30/2016  . GERD (gastroesophageal reflux disease) 10/29/2016  . Small bowel obstruction ([place]) 10/29/2016  . History of drug use 07/17/2016  . Constipation 05/30/2016  . Fecal incontinence 01/04/2016  . Hepatic cirrhosis ([place]) 04/08/2015  . History of adenomatous polyp of colon   . History of colonic polyps   . Hx of adenomatous colonic polyps 12/10/2014  . Lichen sclerosus et atrophicus 10/13/2014  . Vaginal itching 10/01/2014  . Vaginal irritation 10/01/2014  . Yeast infection of the vagina 10/01/2014  . Blurred vision, right eye 03/20/2014  . UTI (lower urinary tract infection) 03/20/2014  . Cocaine abuse ([place]) 03/20/2014  . Generalized weakness 03/20/2014  . Chest pain 02/10/2014  . Chest pain at rest 02/10/2014  . Type 2 diabetes mellitus ([place])   . Diabetes mellitus, type II ([place])   . Tobacco abuse   . PSVT (paroxysmal supraventricular tachycardia) ([place]) 11/04/2013  . Essential  hypertension 10/16/2013  . Renal mass 10/14/2013  . Ovarian mass, left 08/17/2013  . Hepatitis C, chronic ([place]) 07/01/2013  . Elevated LFTs 07/01/2013  . Other dysphagia 07/01/2013  . Encounter for screening colonoscopy 07/01/2013    Past Surgical History:  Procedure Laterality Date  . ABDOMINAL HYSTERECTOMY    . COLONOSCOPY N/A 07/08/2013   RMR: Multiple colonic polyps treated [name] as described above. Inadequate prepartation comprimised examination there colonic diverticulosis. tubular and tubulovillous  adenomas. next TCS 12/2013  . COLONOSCOPY N/A 02/09/2015   RMR: Colonic polyp removed as described above. Tubular adenoma. Next colonoscopy January 2022.  . ESOPHAGOGASTRODUODENOSCOPY N/A 07/08/2013   RMR: Schatzki's ring ; small hiatal hernia-status post passage of a Maloney dilator  . MALONEY DILATION N/A 07/08/2013   Procedure: [name] DILATION;  Surgeon: [name], MD;  [facility];  Service: Endoscopy;  Laterality: N/A;  . ROBOT ASSISTED LAPAROSCOPIC NEPHRECTOMY Right 10/14/2013   Procedure: ROBOTIC ASSISTED LAPAROSCOPIC RADICAL NEPHRECTOMY;  Surgeon: [name], MD;  Location: [facility];  Service: Urology;  Laterality: Right;  . SAVORY DILATION N/A 07/08/2013   Procedure: SAVORY DILATION;  Surgeon: [name], MD;  [facility];  Service: Endoscopy;  Laterality: N/A;  . TONSILLECTOMY    . Wart removal       OB History    Gravida  1   Para  1   Term      Preterm      AB      Living  1     SAB      TAB      Ectopic      Multiple      Live Births               Home Medications    Prior to Admission medications   Medication Sig Start Date End Date Taking? Authorizing Provider  amiodarone (PACERONE) 100 MG tablet Take 1 tablet (100 mg total) by mouth daily. TAKE ONE TABLET BY MOUTH ONCE DAILY 05/31/17  Yes Satira Sark, MD  amLODipine (NORVASC) 10 MG tablet Take 1 tablet (10 mg total) by mouth daily. 11/04/16  Yes TatShanon Brow, MD  aspirin EC 81 MG EC tablet Take 1 tablet (81 mg total) by mouth daily. 05/24/17  Yes Isaac Bliss, Rayford Halsted, MD  atorvastatin (LIPITOR) 40 MG tablet Take 1 tablet (40 mg total) by mouth daily at 6 PM. 05/23/17  Yes Isaac Bliss, Rayford Halsted, MD  carvedilol (COREG) 6.25 MG tablet Take 6.25 mg by mouth 2 (two) times daily. 08/21/17  Yes [provider]  cloNIDine (CATAPRES) 0.2 MG tablet Take 0.2 mg by mouth 2 (two) times daily. 08/21/17  Yes [provider]  gabapentin (NEURONTIN) 600 MG  tablet Take 1 tablet by mouth 3 (three) times daily. 04/17/17  Yes [provider]  LANTUS 100 UNIT/ML injection INJECT 9 UNITS UNDER THE SKIN ONCE DAILY 08/21/17  Yes [provider]  lisinopril-hydrochlorothiazide (PRINZIDE,ZESTORETIC) 20-25 MG tablet Take 1 tablet by mouth 2 (two) times daily.   Yes [provider]  pantoprazole (PROTONIX) 40 MG tablet Take 1 tablet (40 mg total) by mouth daily. Take 30 minutes before breakfast 03/06/16  Yes Annitta Needs, NP  insulin detemir (LEVEMIR) 100 UNIT/ML injection Inject 0.1 mLs (10 Units total) into the skin daily. 05/24/17   Isaac Bliss, Rayford Halsted, MD  potassium chloride (K-DUR) 10 MEQ tablet Take 1 tablet (10 mEq total) by mouth daily. 09/05/17  Noemi Chapel, MD    Family History Family History  Problem Relation Age of Onset  . Colon cancer Sister 55  . Cancer Mother   . Heart attack Father   . Hypertension Daughter   . Cancer Maternal Grandfather   . Diabetes Paternal Grandmother     Social History Social History   Tobacco Use  . Smoking status: Current Some Day Smoker    Packs/day: 0.10    Years: 40.00    Pack years: 4.00    Types: Cigarettes  . Smokeless tobacco: Never Used  Substance Use Topics  . Alcohol use: No    Alcohol/week: 0.0 standard drinks    Comment: rarely  . Drug use: Yes    Types: Marijuana, Cocaine    Comment: 4-5 days      Allergies   Sulfa antibiotics   Review of Systems Review of Systems  All other systems reviewed and are negative.    Physical Exam Updated Vital Signs BP 130/77 (BP Location: Right Arm)   Pulse (!) 56   Temp 98.5 F (36.9 C) (Oral)   Resp 18   Ht 5' (1.524 m)   Wt 63.5 kg   SpO2 98%   BMI 27.34 kg/m   Physical Exam  Constitutional: She appears well-developed and well-nourished. No distress.  HENT:  Head: Normocephalic and atraumatic.  Mouth/Throat: Oropharynx is clear and moist. No oropharyngeal exudate.  Eyes: Pupils are equal, round,  and reactive to light. Conjunctivae and EOM are normal. Right eye exhibits no discharge. Left eye exhibits no discharge. No scleral icterus.  Neck: Normal range of motion. Neck supple. No JVD present. No thyromegaly present.  Cardiovascular: Normal rate, regular rhythm and intact distal pulses. Exam reveals no gallop and no friction rub.  Murmur heard. Pulmonary/Chest: Effort normal and breath sounds normal. No respiratory distress. She has no wheezes. She has no rales.  Abdominal: Soft. Bowel sounds are normal. She exhibits no distension and no mass. There is no tenderness.  No pulsating mass, no focal abdominal tenderness, no guarding  Musculoskeletal: Normal range of motion. She exhibits no edema or tenderness.  Lymphadenopathy:    She has no cervical adenopathy.  Neurological: She is alert. Coordination normal.  Skin: Skin is warm and dry. No rash noted. No erythema.  Psychiatric: She has a normal mood and affect. Her behavior is normal.  Nursing note and vitals reviewed.    ED Treatments / Results  Labs (all labs ordered are listed, but only abnormal results are displayed) Labs Reviewed  COMPREHENSIVE METABOLIC PANEL - Abnormal; Notable for the following components:      Result Value   Potassium 2.9 (*)    Glucose, Bld 106 (*)    BUN 27 (*)    Creatinine, Ser 1.23 (*)    Albumin 3.1 (*)    AST 49 (*)    GFR calc non Af Amer 45 (*)    GFR calc Af Amer 52 (*)    All other components within normal limits  URINALYSIS, ROUTINE W REFLEX MICROSCOPIC - Abnormal; Notable for the following components:   APPearance CLOUDY (*)    Hgb urine dipstick SMALL (*)    Protein, ur 100 (*)    Leukocytes, UA TRACE (*)    Bacteria, UA FEW (*)    All other components within normal limits  RAPID URINE DRUG SCREEN, HOSP PERFORMED - Abnormal; Notable for the following components:   Cocaine POSITIVE (*)    All other components within normal limits  CBC WITH DIFFERENTIAL/PLATELET  LIPASE, BLOOD    ETHANOL  TROPONIN I    EKG EKG Interpretation  Date/Time:  Thursday September 05 2017 07:14:56 EDT Ventricular Rate:  56 PR Interval:    QRS Duration: 104 QT Interval:  488 QTC Calculation: 471 R Axis:   28 Text Interpretation:  Sinus rhythm Prolonged PR interval since 05/21/17,. no sig changes seen Confirmed by Noemi Chapel [phone]) on 09/05/2017 7:19:32 AM   Radiology No results found.  Procedures Procedures (including critical care time)  Medications Ordered in ED Medications  potassium chloride SA (K-DUR,KLOR-CON) CR tablet 40 mEq (has no administration in time range)     Initial Impression / Assessment and Plan / ED Course  I have reviewed the triage vital signs and the nursing notes.  Pertinent labs & imaging results that were available during my care of the patient were reviewed by me and considered in my medical decision making (see chart for details).  Clinical Course as of Sep 05 1099  Thu Sep 05, 2017  1059 UA neg - K low - replaced meds given   [BM]    Clinical Course User Index [BM] Noemi Chapel, MD    Murmur auscultated, no other cardiac findings, normal pulses, no edema.  Abdomen exam is rather benign, she does have a CT scan from October of last year which shows that she possibly had pancreatitis, this is not surprising given her alcohol use.  Will check some labs, the patient is otherwise in no distress at this time, I do not see any indication for imaging unless there are significant abnormal labs.  This is not related to her heart, she has no exertional symptoms, she has no symptoms in her chest her arms or shoulder or her neck.  Patient improved, potassium given, labs show cocaine positive but no other acute findings including CBC and metabolic panel and lipase, troponin negative, alcohol negative patient informed, stable for discharge with 7 days of potassium and she expressed her understanding to the need for a recheck within 1 week  Final Clinical  Impressions(s) / ED Diagnoses   Final diagnoses:  Epigastric pain  Hypokalemia    ED Discharge Orders         Ordered    potassium chloride (K-DUR) 10 MEQ tablet  Daily     09/05/17 1100           Noemi Chapel, MD 09/05/17 1101

## 2017-09-05 NOTE — ED Notes (Signed)
Advised patient we needed urine specimen.  Patient stated unable to provide at this time.

## 2017-09-05 NOTE — Patient Instructions (Signed)
Your physician recommends that you schedule a follow-up appointment in: 3 months with [facility]      Your physician recommends that you continue on your current medications as directed. Please refer to the Current Medication list given to you today.     If you need a refill on your cardiac medications before your next appointment, please call your pharmacy.     Please get lab work : TSH JUST BEFORE NEXT VISIT

## 2017-09-05 NOTE — ED Triage Notes (Signed)
Onset yesterday weakness and shaking all over, some chest discomfort. Pt has cardiologist appointment today at [facility].

## 2017-09-05 NOTE — Discharge Instructions (Signed)
Your testing shows low potassium Your testing is positive for cocaine - this will cause severe problems -you must stop using it, it can cause heart attacks and death  Take potassium once a day for the next 7 days, go to the cardiologist office right now to make your appointment.

## 2017-10-29 ENCOUNTER — Emergency Department (HOSPITAL_COMMUNITY): Payer: Medicare HMO

## 2017-10-29 ENCOUNTER — Other Ambulatory Visit: Payer: Self-pay

## 2017-10-29 ENCOUNTER — Emergency Department (HOSPITAL_COMMUNITY)
Admission: EM | Admit: 2017-10-29 | Discharge: 2017-10-29 | Disposition: A | Discharge status: Home / Self Care | Payer: Medicare HMO | Attending: Emergency Medicine | Admitting: Emergency Medicine

## 2017-10-29 ENCOUNTER — Encounter (HOSPITAL_COMMUNITY): Payer: Self-pay

## 2017-10-29 DIAGNOSIS — N183 Chronic kidney disease, stage 3 (moderate): Secondary | ICD-10-CM | POA: Insufficient documentation

## 2017-10-29 DIAGNOSIS — Z79899 Other long term (current) drug therapy: Secondary | ICD-10-CM | POA: Insufficient documentation

## 2017-10-29 DIAGNOSIS — R2243 Localized swelling, mass and lump, lower limb, bilateral: Secondary | ICD-10-CM | POA: Diagnosis present

## 2017-10-29 DIAGNOSIS — R600 Localized edema: Secondary | ICD-10-CM | POA: Diagnosis not present

## 2017-10-29 DIAGNOSIS — Z794 Long term (current) use of insulin: Secondary | ICD-10-CM | POA: Diagnosis not present

## 2017-10-29 DIAGNOSIS — E1122 Type 2 diabetes mellitus with diabetic chronic kidney disease: Secondary | ICD-10-CM | POA: Diagnosis not present

## 2017-10-29 DIAGNOSIS — J45909 Unspecified asthma, uncomplicated: Secondary | ICD-10-CM | POA: Diagnosis not present

## 2017-10-29 DIAGNOSIS — I129 Hypertensive chronic kidney disease with stage 1 through stage 4 chronic kidney disease, or unspecified chronic kidney disease: Secondary | ICD-10-CM | POA: Diagnosis not present

## 2017-10-29 DIAGNOSIS — Z7982 Long term (current) use of aspirin: Secondary | ICD-10-CM | POA: Insufficient documentation

## 2017-10-29 DIAGNOSIS — F17210 Nicotine dependence, cigarettes, uncomplicated: Secondary | ICD-10-CM | POA: Insufficient documentation

## 2017-10-29 LAB — COMPREHENSIVE METABOLIC PANEL
ALBUMIN: 3.6 g/dL (ref 3.5–5.0)
ALK PHOS: 61 U/L (ref 38–126)
ALT: 27 U/L (ref 0–44)
ANION GAP: 9 (ref 5–15)
AST: 35 U/L (ref 15–41)
BILIRUBIN TOTAL: 0.5 mg/dL (ref 0.3–1.2)
BUN: 22 mg/dL (ref 8–23)
CALCIUM: 9 mg/dL (ref 8.9–10.3)
CO2: 27 mmol/L (ref 22–32)
Chloride: 103 mmol/L (ref 98–111)
Creatinine, Ser: 1.31 mg/dL — ABNORMAL HIGH (ref 0.44–1.00)
GFR calc Af Amer: 48 mL/min — ABNORMAL LOW (ref >60)
GFR calc non Af Amer: 41 mL/min — ABNORMAL LOW (ref >60)
GLUCOSE: 100 mg/dL — AB (ref 70–99)
Potassium: 3.2 mmol/L — ABNORMAL LOW (ref 3.5–5.1)
Sodium: 139 mmol/L (ref 135–145)
TOTAL PROTEIN: 8 g/dL (ref 6.5–8.1)

## 2017-10-29 LAB — CBC WITH DIFFERENTIAL/PLATELET
BASOS PCT: 0 %
Basophils Absolute: 0 10*3/uL (ref 0.0–0.1)
Eosinophils Absolute: 0.4 10*3/uL (ref 0.0–0.5)
Eosinophils Relative: 3 %
HEMATOCRIT: 39.6 % (ref 36.0–46.0)
HEMOGLOBIN: 12.8 g/dL (ref 12.0–15.0)
LYMPHS ABS: 4.9 10*3/uL — AB (ref 0.7–4.0)
LYMPHS PCT: 44 %
MCH: 28.5 pg (ref 26.0–34.0)
MCHC: 32.3 g/dL (ref 30.0–36.0)
MCV: 88.2 fL (ref 80.0–100.0)
MONOS PCT: 7 %
Monocytes Absolute: 0.8 10*3/uL (ref 0.1–1.0)
NEUTROS ABS: 5.2 10*3/uL (ref 1.7–7.7)
Neutrophils Relative %: 46 %
Platelets: 321 10*3/uL (ref 150–400)
RBC: 4.49 MIL/uL (ref 3.87–5.11)
RDW: 13.1 % (ref 11.5–15.5)
WBC: 11.3 10*3/uL — ABNORMAL HIGH (ref 4.0–10.5)
nRBC: 0 % (ref 0.0–0.2)

## 2017-10-29 LAB — TROPONIN I: Troponin I: <0.03 ng/mL (ref <0.03)

## 2017-10-29 MED ORDER — FUROSEMIDE 20 MG PO TABS: 20.0000 mg | ORAL_TABLET | Freq: Every day | ORAL | 0 refills | Status: DC

## 2017-10-29 NOTE — Discharge Instructions (Addendum)
Tests showed no life-threatening condition.  Add 1 more dose of fluid medication for swelling.  Prescription given for 10 days.  Follow-up with your primary care doctor.

## 2017-10-29 NOTE — ED Triage Notes (Signed)
Pt reports nonproductive cough x 2 weeks and leg swelling x 1 week.  Denies any pain or SOB.

## 2017-10-29 NOTE — ED Provider Notes (Signed)
[facility] EMERGENCY DEPARTMENT Provider Note   CSN: [id number] Arrival date & time: 10/29/17  0754     History   Chief Complaint Chief Complaint  Patient presents with  . Leg Swelling  . Cough    HPI Cassandra Jordan is a 67 y.o. female.  Patient presents with a concern of bilateral lower extremity edema for 2 weeks.  She reports a nonproductive cough, but no chest pain, dyspnea, fever, sweats, chills.  She has an extensive past medical history including hypertension, hyperlipidemia, hepatitis C, paroxysmal SVT, diabetes, polysubstance abuse.  Severity of symptoms is minimal.  Nothing makes symptoms better or worse.     Past Medical History:  Diagnosis Date  . Alcohol abuse   . Arthritis   . Asthma   . Cocaine abuse ([place])   . Essential hypertension   . GERD (gastroesophageal reflux disease)   . Hepatitis C   . Hyperlipidemia   . Lichen sclerosus et atrophicus 10/13/2014  . Neuropathic pain   . Noncompliance   . PSVT (paroxysmal supraventricular tachycardia) ([place])   . Rash   . Renal carcinoma ([place])   . Type 2 diabetes mellitus ([place])   . Vaginal irritation 10/01/2014  . Vaginal itching 10/01/2014  . Yeast infection of the vagina 10/01/2014    Patient Active Problem List   Diagnosis Date Noted  . Hyperphosphatemia 05/22/2017  . Acute CVA (cerebrovascular accident) ([place]) 05/21/2017  . Abdominal pain 11/18/2016  . Cholelithiasis 11/18/2016  . Uncontrolled type 2 diabetes mellitus with hyperglycemia, with long-term current use of insulin ([place]) 11/18/2016  . CKD (chronic kidney disease), stage III ([place]) 11/18/2016  . Acute pancreatitis   . Ileus ([place])   . Porcelain gallbladder   . Acute renal failure superimposed on stage 3 chronic kidney disease ([place]) 11/04/2016  . Hypokalemia 11/01/2016  . Hypomagnesemia 11/01/2016  . SBO (small bowel obstruction) ([place])   . AKI (acute kidney injury) ([place]) 10/30/2016  . Leukocytosis 10/30/2016  . GERD (gastroesophageal reflux  disease) 10/29/2016  . Small bowel obstruction ([place]) 10/29/2016  . History of drug use 07/17/2016  . Constipation 05/30/2016  . Fecal incontinence 01/04/2016  . Hepatic cirrhosis ([place]) 04/08/2015  . History of adenomatous polyp of colon   . History of colonic polyps   . Hx of adenomatous colonic polyps 12/10/2014  . Lichen sclerosus et atrophicus 10/13/2014  . Vaginal itching 10/01/2014  . Vaginal irritation 10/01/2014  . Yeast infection of the vagina 10/01/2014  . Blurred vision, right eye 03/20/2014  . UTI (lower urinary tract infection) 03/20/2014  . Cocaine abuse ([place]) 03/20/2014  . Generalized weakness 03/20/2014  . Chest pain 02/10/2014  . Chest pain at rest 02/10/2014  . Type 2 diabetes mellitus ([place])   . Diabetes mellitus, type II ([place])   . Tobacco abuse   . PSVT (paroxysmal supraventricular tachycardia) ([place]) 11/04/2013  . Essential hypertension 10/16/2013  . Renal mass 10/14/2013  . Ovarian mass, left 08/17/2013  . Hepatitis C, chronic ([place]) 07/01/2013  . Elevated LFTs 07/01/2013  . Other dysphagia 07/01/2013  . Encounter for screening colonoscopy 07/01/2013    Past Surgical History:  Procedure Laterality Date  . ABDOMINAL HYSTERECTOMY    . COLONOSCOPY N/A 07/08/2013   RMR: Multiple colonic polyps treated [name] as described above. Inadequate prepartation comprimised examination there colonic diverticulosis. tubular and tubulovillous adenomas. next TCS 12/2013  . COLONOSCOPY N/A 02/09/2015   RMR: Colonic polyp removed as described above. Tubular adenoma. Next colonoscopy January 2022.  . ESOPHAGOGASTRODUODENOSCOPY N/A  07/08/2013   RMR: Schatzki's ring ; small hiatal hernia-status post passage of a Maloney dilator  . MALONEY DILATION N/A 07/08/2013   Procedure: [name] DILATION;  Surgeon: [name], MD;  Location: [facility];  Service: Endoscopy;  Laterality: N/A;  . ROBOT ASSISTED LAPAROSCOPIC NEPHRECTOMY Right 10/14/2013   Procedure: ROBOTIC ASSISTED  LAPAROSCOPIC RADICAL NEPHRECTOMY;  Surgeon: [name], MD;  Location: [facility] ORS;  Service: Urology;  Laterality: Right;  . SAVORY DILATION N/A 07/08/2013   Procedure: SAVORY DILATION;  Surgeon: [name], MD;  Location: [facility] SU[facility];  Service: Endoscopy;  Laterality: N/A;  . TONSILLECTOMY    . Wart removal       OB History    Gravida  1   Para  1   Term      Preterm      AB      Living  1     SAB      TAB      Ectopic      Multiple      Live Births               Home Medications    Prior to Admission medications   Medication Sig Start Date End Date Taking? Authorizing Provider  amiodarone (PACERONE) 100 MG tablet Take 1 tablet (100 mg total) by mouth daily. TAKE ONE TABLET BY MOUTH ONCE DAILY 05/31/17  Yes Satira Sark, MD  amLODipine (NORVASC) 10 MG tablet Take 1 tablet (10 mg total) by mouth daily. 11/04/16  Yes TatShanon Brow, MD  aspirin EC 81 MG EC tablet Take 1 tablet (81 mg total) by mouth daily. 05/24/17  Yes Isaac Bliss, Rayford Halsted, MD  carvedilol (COREG) 6.25 MG tablet Take 6.25 mg by mouth 2 (two) times daily. 08/21/17  Yes [provider]  cloNIDine (CATAPRES) 0.2 MG tablet Take 0.2 mg by mouth 2 (two) times daily. 08/21/17  Yes [provider]  furosemide (LASIX) 20 MG tablet Take 20 mg by mouth daily.   Yes [provider]  gabapentin (NEURONTIN) 600 MG tablet Take 1 tablet by mouth 3 (three) times daily. 04/17/17  Yes [provider]  LANTUS 100 UNIT/ML injection INJECT 9 UNITS UNDER THE SKIN ONCE DAILY 08/21/17  Yes [provider]  lisinopril-hydrochlorothiazide (PRINZIDE,ZESTORETIC) 20-25 MG tablet Take 1 tablet by mouth 2 (two) times daily.   Yes [provider]  pantoprazole (PROTONIX) 40 MG tablet Take 1 tablet (40 mg total) by mouth daily. Take 30 minutes before breakfast 03/06/16  Yes Annitta Needs, NP  phenylephrine (SUDAFED PE) 10 MG TABS tablet Take 10 mg by mouth every 4 (four)  hours as needed.   Yes [provider]  atorvastatin (LIPITOR) 40 MG tablet Take 1 tablet (40 mg total) by mouth daily at 6 PM. 05/23/17   Isaac Bliss, Rayford Halsted, MD  furosemide (LASIX) 20 MG tablet Take 1 tablet (20 mg total) by mouth daily. 10/29/17   Nat Christen, MD  insulin detemir (LEVEMIR) 100 UNIT/ML injection Inject 0.1 mLs (10 Units total) into the skin daily. 05/24/17   Isaac Bliss, Rayford Halsted, MD  potassium chloride (K-DUR) 10 MEQ tablet Take 1 tablet (10 mEq total) by mouth daily. 09/05/17   Noemi Chapel, MD    Family History Family History  Problem Relation Age of Onset  . Colon cancer Sister 24  . Cancer Mother   . Heart attack Father   . Hypertension Daughter   . Cancer Maternal  Grandfather   . Diabetes Paternal Grandmother     Social History Social History   Tobacco Use  . Smoking status: Current Some Day Smoker    Packs/day: 0.10    Years: 40.00    Pack years: 4.00    Types: Cigarettes  . Smokeless tobacco: Never Used  Substance Use Topics  . Alcohol use: Not Currently    Alcohol/week: 0.0 standard drinks    Comment: rarely  . Drug use: Yes    Types: Marijuana, Cocaine    Comment: last used yesterday     Allergies   Sulfa antibiotics   Review of Systems Review of Systems  All other systems reviewed and are negative.    Physical Exam Updated Vital Signs BP (!) 169/95   Pulse 73   Temp 97.8 F (36.6 C) (Oral)   Resp 15   Ht 5' (1.524 m)   Wt 63 kg   SpO2 95%   BMI 27.15 kg/m   Physical Exam  Constitutional: She is oriented to person, place, and time. She appears well-developed and well-nourished.  nad  HENT:  Head: Normocephalic and atraumatic.  Eyes: Conjunctivae are normal.  Neck: Neck supple.  Cardiovascular: Normal rate and regular rhythm.  Pulmonary/Chest: Effort normal and breath sounds normal.  Abdominal: Soft. Bowel sounds are normal.  Musculoskeletal: Normal range of motion.  Neurological: She is alert and  oriented to person, place, and time.  Skin: Skin is warm and dry.  1-2 plus peripheral edema  Psychiatric: She has a normal mood and affect. Her behavior is normal.  Nursing note and vitals reviewed.    ED Treatments / Results  Labs (all labs ordered are listed, but only abnormal results are displayed) Labs Reviewed  CBC WITH DIFFERENTIAL/PLATELET - Abnormal; Notable for the following components:      Result Value   WBC 11.3 (*)    Lymphs Abs 4.9 (*)    All other components within normal limits  COMPREHENSIVE METABOLIC PANEL - Abnormal; Notable for the following components:   Potassium 3.2 (*)    Glucose, Bld 100 (*)    Creatinine, Ser 1.31 (*)    GFR calc non Af Amer 41 (*)    GFR calc Af Amer 48 (*)    All other components within normal limits  TROPONIN I    EKG EKG Interpretation  Date/Time:  Tuesday October 29 2017 08:12:56 EDT Ventricular Rate:  74 PR Interval:    QRS Duration: 97 QT Interval:  445 QTC Calculation: 494 R Axis:   45 Text Interpretation:  Sinus rhythm Borderline prolonged PR interval Borderline prolonged QT interval Confirmed by Nat Christen [phone]) on 10/29/2017 8:34:10 AM   Radiology Dg Chest 2 View  Result Date: 10/29/2017 CLINICAL DATA:  Cough EXAM: CHEST - 2 VIEW COMPARISON:  05/22/2017 FINDINGS: Heart is normal size. Mild peribronchial thickening and interstitial prominence, likely bronchitic changes. No confluent opacities or effusions. No acute bony abnormality. IMPRESSION: Mild bronchitic changes. Electronically Signed   By: Rolm Baptise M.D.   On: 10/29/2017 09:04    Procedures Procedures (including critical care time)  Medications Ordered in ED Medications - No data to display   Initial Impression / Assessment and Plan / ED Course  I have reviewed the triage vital signs and the nursing notes.  Pertinent labs & imaging results that were available during my care of the patient were reviewed by me and considered in my medical decision  making (see chart for details).  Patient presents with cough and peripheral edema.  I do not hear her cough in the ED.  She has minimal peripheral edema.  Screening tests were reasonable.  Will give her Lasix 20 mg prn to be added to her medication regimen for swelling.  Final Clinical Impressions(s) / ED Diagnoses   Final diagnoses:  Leg edema    ED Discharge Orders         Ordered    furosemide (LASIX) 20 MG tablet  Daily     10/29/17 1221           Nat Christen, MD 10/29/17 1234

## 2017-11-07 ENCOUNTER — Other Ambulatory Visit (HOSPITAL_BASED_OUTPATIENT_CLINIC_OR_DEPARTMENT_OTHER): Payer: Self-pay

## 2017-11-07 DIAGNOSIS — G4730 Sleep apnea, unspecified: Secondary | ICD-10-CM

## 2017-11-07 DIAGNOSIS — R0683 Snoring: Secondary | ICD-10-CM

## 2017-11-27 ENCOUNTER — Ambulatory Visit: Payer: Medicare Other | Attending: Internal Medicine | Admitting: Neurology

## 2017-11-27 DIAGNOSIS — Z79899 Other long term (current) drug therapy: Secondary | ICD-10-CM | POA: Diagnosis not present

## 2017-11-27 DIAGNOSIS — Z7982 Long term (current) use of aspirin: Secondary | ICD-10-CM | POA: Diagnosis not present

## 2017-11-27 DIAGNOSIS — G4730 Sleep apnea, unspecified: Secondary | ICD-10-CM

## 2017-11-27 DIAGNOSIS — R0683 Snoring: Secondary | ICD-10-CM | POA: Diagnosis present

## 2017-11-27 DIAGNOSIS — G4733 Obstructive sleep apnea (adult) (pediatric): Secondary | ICD-10-CM | POA: Insufficient documentation

## 2017-11-27 DIAGNOSIS — Z794 Long term (current) use of insulin: Secondary | ICD-10-CM | POA: Diagnosis not present

## 2017-12-01 NOTE — Procedures (Signed)
[place] A. Merlene Laughter, MD     [email or web address]             NOCTURNAL POLYSOMNOGRAPHY   LOCATION: [facility]   Patient Name: Cassandra, Jordan Date: 11/27/2017 Gender: Female D.O.B: Jul 04, 1950 Age (years): 52 Referring Provider: Abran Richard MD Height (inches): 60 Interpreting Physician: Phillips Odor MD, ABSM Weight (lbs): 143 RPSGT: Peak, Robert BMI: 28 MRN: [id number] Neck Size: 13.00 CLINICAL INFORMATION Sleep Study Type: Split Night CPAP  Indication for sleep study: N/A  Epworth Sleepiness Score: NA  SLEEP STUDY TECHNIQUE As per the AASM Manual for the Scoring of Sleep and Associated Events v2.3 (April 2016) with a hypopnea requiring 4% desaturations.  The channels recorded and monitored were frontal, central and occipital EEG, electrooculogram (EOG), submentalis EMG (chin), nasal and oral airflow, thoracic and abdominal wall motion, anterior tibialis EMG, snore microphone, electrocardiogram, and pulse oximetry. Continuous positive airway pressure (CPAP) was initiated when the patient met split night criteria and was titrated according to treat sleep-disordered breathing.  MEDICATIONS Medications self-administered by patient taken the night of the study : N/A  Current Outpatient Medications:  .  amiodarone (PACERONE) 100 MG tablet, Take 1 tablet (100 mg total) by mouth daily. TAKE ONE TABLET BY MOUTH ONCE DAILY, Disp: 90 tablet, Rfl: 1 .  amLODipine (NORVASC) 10 MG tablet, Take 1 tablet (10 mg total) by mouth daily., Disp: 30 tablet, Rfl: 1 .  aspirin EC 81 MG EC tablet, Take 1 tablet (81 mg total) by mouth daily., Disp: , Rfl:  .  atorvastatin (LIPITOR) 40 MG tablet, Take 1 tablet (40 mg total) by mouth daily at 6 PM., Disp: 30 tablet, Rfl: 2 .  carvedilol (COREG) 6.25 MG tablet, Take 6.25 mg by mouth 2 (two) times daily., Disp: , Rfl: 0 .  cloNIDine (CATAPRES) 0.2 MG tablet, Take 0.2 mg by mouth 2 (two) times daily., Disp: , Rfl: 0 .   furosemide (LASIX) 20 MG tablet, Take 20 mg by mouth daily., Disp: , Rfl:  .  furosemide (LASIX) 20 MG tablet, Take 1 tablet (20 mg total) by mouth daily., Disp: 10 tablet, Rfl: 0 .  gabapentin (NEURONTIN) 600 MG tablet, Take 1 tablet by mouth 3 (three) times daily., Disp: , Rfl: 0 .  insulin detemir (LEVEMIR) 100 UNIT/ML injection, Inject 0.1 mLs (10 Units total) into the skin daily., Disp: 10 mL, Rfl: 11 .  LANTUS 100 UNIT/ML injection, INJECT 9 UNITS UNDER THE SKIN ONCE DAILY, Disp: , Rfl: 6 .  lisinopril-hydrochlorothiazide (PRINZIDE,ZESTORETIC) 20-25 MG tablet, Take 1 tablet by mouth 2 (two) times daily., Disp: , Rfl:  .  pantoprazole (PROTONIX) 40 MG tablet, Take 1 tablet (40 mg total) by mouth daily. Take 30 minutes before breakfast, Disp: 90 tablet, Rfl: 3 .  phenylephrine (SUDAFED PE) 10 MG TABS tablet, Take 10 mg by mouth every 4 (four) hours as needed., Disp: , Rfl:  .  potassium chloride (K-DUR) 10 MEQ tablet, Take 1 tablet (10 mEq total) by mouth daily., Disp: 7 tablet, Rfl: 0   RESPIRATORY PARAMETERS Diagnostic  Total AHI (/hr): 20.3 RDI (/hr): 22.0 OA Index (/hr): 2.9 CA Index (/hr): 2.6 REM AHI (/hr): N/A NREM AHI (/hr): 20.3 Supine AHI (/hr): 90.0 Non-supine AHI (/hr): 13 Min O2 Sat (%): 78.0 Mean O2 (%): 92.0 Time below 88% (min): 4.1   Titration  Optimal Pressure (cm): 9 AHI at Optimal Pressure (/hr): 13.2 Min O2 at Optimal Pressure (%): 91.0 Supine % at Optimal (%): 100 Sleep % at  Optimal (%): 98   SLEEP ARCHITECTURE The recording time for the entire night was 439.5 minutes.  During a baseline period of 220.2 minutes, the patient slept for 210.0 minutes in REM and nonREM, yielding a sleep efficiency of 95.3%%. Sleep onset after lights out was 1.6 minutes with a REM latency of N/A minutes. The patient spent 3.3%% of the night in stage N1 sleep, 74.8%% in stage N2 sleep, 21.9%% in stage N3 and 0% in REM.  During the titration period of 218.3 minutes, the patient slept for  214.9 minutes in REM and nonREM, yielding a sleep efficiency of 98.4%%. Sleep onset after CPAP initiation was 1.0 minutes with a REM latency of N/A minutes. The patient spent 0.5%% of the night in stage N1 sleep, 99.5%% in stage N2 sleep, 0.0%% in stage N3 and 0% in REM.  CARDIAC DATA The 2 lead EKG demonstrated sinus rhythm. The mean heart rate was 100.0 beats per minute. Other EKG findings include: None. LEG MOVEMENT DATA The total Periodic Limb Movements of Sleep (PLMS) were 6. The PLMS index was 0.8.  IMPRESSIONS Moderate obstructive sleep apnea occurred during the diagnostic portion of the study(AHI = 20.3/hour). The optimal CPAP selected for this patient is ( 9 cm of water)  Delano Metz, MD Diplomate, American Board of Sleep Medicine.  ELECTRONICALLY SIGNED ON:  12/01/2017, 7:29 PM [place] PH: [phone]   FX: [phone] [place]

## 2017-12-06 NOTE — Progress Notes (Deleted)
Cardiology Office Note  Date: 12/06/2017   ID: Cassandra Jordan, DOB 19-Feb-1950, MRN [id number]  PCP: [facility]  Primary Cardiologist: Rozann Lesches, MD   No chief complaint on file.   History of Present Illness: Cassandra Jordan is a 67 y.o. female last seen in August.  Past Medical History:  Diagnosis Date  . Alcohol abuse   . Arthritis   . Asthma   . Cocaine abuse ([place])   . Essential hypertension   . GERD (gastroesophageal reflux disease)   . Hepatitis C   . Hyperlipidemia   . Lichen sclerosus et atrophicus 10/13/2014  . Neuropathic pain   . Noncompliance   . PSVT (paroxysmal supraventricular tachycardia) ([place])   . Rash   . Renal carcinoma ([place])   . Type 2 diabetes mellitus ([place])   . Vaginal irritation 10/01/2014  . Vaginal itching 10/01/2014  . Yeast infection of the vagina 10/01/2014    Past Surgical History:  Procedure Laterality Date  . ABDOMINAL HYSTERECTOMY    . COLONOSCOPY N/A 07/08/2013   RMR: Multiple colonic polyps treated [name] as described above. Inadequate prepartation comprimised examination there colonic diverticulosis. tubular and tubulovillous adenomas. next TCS 12/2013  . COLONOSCOPY N/A 02/09/2015   RMR: Colonic polyp removed as described above. Tubular adenoma. Next colonoscopy January 2022.  . ESOPHAGOGASTRODUODENOSCOPY N/A 07/08/2013   RMR: Schatzki's ring ; small hiatal hernia-status post passage of a Maloney dilator  . MALONEY DILATION N/A 07/08/2013   Procedure: [name] DILATION;  Surgeon: [name], MD;  [facility];  Service: Endoscopy;  Laterality: N/A;  . ROBOT ASSISTED LAPAROSCOPIC NEPHRECTOMY Right 10/14/2013   Procedure: ROBOTIC ASSISTED LAPAROSCOPIC RADICAL NEPHRECTOMY;  Surgeon: [name], MD;  [facility];  Service: Urology;  Laterality: Right;  . SAVORY DILATION N/A 07/08/2013   Procedure: SAVORY DILATION;  Surgeon: [name], MD;  [facility];  Service:  Endoscopy;  Laterality: N/A;  . TONSILLECTOMY    . Wart removal      Current Outpatient Medications  Medication Sig Dispense Refill  . amiodarone (PACERONE) 100 MG tablet Take 1 tablet (100 mg total) by mouth daily. TAKE ONE TABLET BY MOUTH ONCE DAILY 90 tablet 1  . amLODipine (NORVASC) 10 MG tablet Take 1 tablet (10 mg total) by mouth daily. 30 tablet 1  . aspirin EC 81 MG EC tablet Take 1 tablet (81 mg total) by mouth daily.    Marland Kitchen atorvastatin (LIPITOR) 40 MG tablet Take 1 tablet (40 mg total) by mouth daily at 6 PM. 30 tablet 2  . carvedilol (COREG) 6.25 MG tablet Take 6.25 mg by mouth 2 (two) times daily.  0  . cloNIDine (CATAPRES) 0.2 MG tablet Take 0.2 mg by mouth 2 (two) times daily.  0  . furosemide (LASIX) 20 MG tablet Take 20 mg by mouth daily.    . furosemide (LASIX) 20 MG tablet Take 1 tablet (20 mg total) by mouth daily. 10 tablet 0  . gabapentin (NEURONTIN) 600 MG tablet Take 1 tablet by mouth 3 (three) times daily.  0  . insulin detemir (LEVEMIR) 100 UNIT/ML injection Inject 0.1 mLs (10 Units total) into the skin daily. 10 mL 11  . LANTUS 100 UNIT/ML injection INJECT 9 UNITS UNDER THE SKIN ONCE DAILY  6  . lisinopril-hydrochlorothiazide (PRINZIDE,ZESTORETIC) 20-25 MG tablet Take 1 tablet by mouth 2 (two) times daily.    . pantoprazole (PROTONIX) 40 MG tablet Take 1 tablet (40  mg total) by mouth daily. Take 30 minutes before breakfast 90 tablet 3  . phenylephrine (SUDAFED PE) 10 MG TABS tablet Take 10 mg by mouth every 4 (four) hours as needed.    . potassium chloride (K-DUR) 10 MEQ tablet Take 1 tablet (10 mEq total) by mouth daily. 7 tablet 0   No current facility-administered medications for this visit.    Allergies:  Sulfa antibiotics   Social History: The patient  reports that she has been smoking cigarettes. She has a 4.00 pack-year smoking history. She has never used smokeless tobacco. She reports that she drank alcohol. She reports that she has current or past drug  history. Drugs: Marijuana and Cocaine.   Family History: The patient's family history includes Cancer in her maternal grandfather and mother; Colon cancer (age of onset: 36) in her sister; Diabetes in her paternal grandmother; Heart attack in her father; Hypertension in her daughter.   ROS:  Please see the history of present illness. Otherwise, complete review of systems is positive for {NONE DEFAULTED:18576::"none"}.  All other systems are reviewed and negative.   Physical Exam: VS:  There were no vitals taken for this visit., BMI There is no height or weight on file to calculate BMI.  Wt Readings from Last 3 Encounters:  10/29/17 139 lb (63 kg)  09/05/17 140 lb (63.5 kg)  09/05/17 140 lb (63.5 kg)    General: Patient appears comfortable at rest. HEENT: Conjunctiva and lids normal, oropharynx clear with moist mucosa. Neck: Supple, no elevated JVP or carotid bruits, no thyromegaly. Lungs: Clear to auscultation, nonlabored breathing at rest. Cardiac: Regular rate and rhythm, no S3 or significant systolic murmur, no pericardial rub. Abdomen: Soft, nontender, no hepatomegaly, bowel sounds present, no guarding or rebound. Extremities: No pitting edema, distal pulses 2+. Skin: Warm and dry. Musculoskeletal: No kyphosis. Neuropsychiatric: Alert and oriented x3, affect grossly appropriate.  ECG: I personally reviewed the tracing from 10/29/2017 which showed sinus rhythm with prolonged PR interval and prolonged QT interval.  Recent Labwork: 05/21/2017: Magnesium 1.9 10/29/2017: ALT 27; AST 35; BUN 22; Creatinine, Ser 1.31; Hemoglobin 12.8; Platelets 321; Potassium 3.2; Sodium 139     Component Value Date/Time   CHOL 292 (H) 05/22/2017 0515   TRIG 159 (H) 05/22/2017 0515   HDL 64 05/22/2017 0515   CHOLHDL 4.6 05/22/2017 0515   VLDL 32 05/22/2017 0515   LDLCALC 196 (H) 05/22/2017 0515    Other Studies Reviewed Today:  Echocardiogram 05/23/2017: Study Conclusions  - Left ventricle: The  cavity size was normal. Systolic function was normal. The estimated ejection fraction was in the range of 60% to 65%. Wall motion was normal; there were no regional wall motion abnormalities. Findings consistent with left ventricular diastolic dysfunction, grade indeterminate. Doppler parameters are consistent with high ventricular filling pressure. Mild concentric and moderate focal basal septal hypertrophy. - Aortic valve: Severely calcified annulus. Trileaflet. - Mitral valve: There was mild regurgitation. - Atrial septum: No defect or patent foramen ovale was identified.  30-day event monitor 05/30/2017: Representative strips from 30-day event monitor reviewed. Sinus rhythm was present, there were only 2 reported events. Heart rate ranged from 58 bpm up to 112 bpm with average heart rate 76 bpm. No obvious arrhythmias or pauses.  Assessment and Plan:   Current medicines were reviewed with the patient today.  No orders of the defined types were placed in this encounter.   Disposition:  Signed, Satira Sark, MD, [facility] 12/06/2017 12:38 PM    [facility] [address] [place], [place] [address] Phone: [phone]; Fax: [phone]

## 2017-12-09 ENCOUNTER — Ambulatory Visit: Payer: Medicare HMO | Admitting: Cardiology

## 2017-12-11 ENCOUNTER — Ambulatory Visit (INDEPENDENT_AMBULATORY_CARE_PROVIDER_SITE_OTHER): Payer: Medicare Other | Admitting: Cardiology

## 2017-12-11 ENCOUNTER — Encounter: Payer: Self-pay | Admitting: Cardiology

## 2017-12-11 ENCOUNTER — Other Ambulatory Visit (HOSPITAL_COMMUNITY)
Admission: RE | Admit: 2017-12-11 | Discharge: 2017-12-11 | Disposition: A | Discharge status: Home / Self Care | Payer: Medicare Other | Source: Ambulatory Visit | Attending: Cardiology | Admitting: Cardiology

## 2017-12-11 VITALS — BP 126/78 | HR 69 | Ht 60.0 in | Wt 146.0 lb

## 2017-12-11 DIAGNOSIS — F1910 Other psychoactive substance abuse, uncomplicated: Secondary | ICD-10-CM | POA: Diagnosis not present

## 2017-12-11 DIAGNOSIS — I10 Essential (primary) hypertension: Secondary | ICD-10-CM

## 2017-12-11 DIAGNOSIS — I471 Supraventricular tachycardia: Secondary | ICD-10-CM | POA: Insufficient documentation

## 2017-12-11 DIAGNOSIS — I6523 Occlusion and stenosis of bilateral carotid arteries: Secondary | ICD-10-CM

## 2017-12-11 LAB — HEPATIC FUNCTION PANEL
ALBUMIN: 3 g/dL — AB (ref 3.5–5.0)
ALK PHOS: 61 U/L (ref 38–126)
ALT: 20 U/L (ref 0–44)
AST: 29 U/L (ref 15–41)
BILIRUBIN TOTAL: 0.4 mg/dL (ref 0.3–1.2)
Total Protein: 7.3 g/dL (ref 6.5–8.1)

## 2017-12-11 LAB — TSH: TSH: 1.593 u[IU]/mL (ref 0.350–4.500)

## 2017-12-11 NOTE — Patient Instructions (Signed)
Medication Instructions:  Your physician recommends that you continue on your current medications as directed. Please refer to the Current Medication list given to you today.   Labwork: lft's  tsh   Testing/Procedures: none  Follow-Up: Your physician wants you to follow-up in: 6 months.  You will receive a reminder letter in the mail two months in advance. If you don't receive a letter, please call our office to schedule the follow-up appointment.   Any Other Special Instructions Will Be Listed Below (If Applicable).     If you need a refill on your cardiac medications before your next appointment, please call your pharmacy.

## 2017-12-11 NOTE — Progress Notes (Signed)
Cardiology Office Note  Date: 12/11/2017   ID: Cassandra Jordan, DOB 1951-01-07, MRN [id number]  PCP: [facility]  Primary Cardiologist: Rozann Lesches, MD   Chief Complaint  Patient presents with  . PSVT    History of Present Illness: Cassandra Jordan is a 67 y.o. female last seen in August.  She is here for a routine follow-up visit.  She does not report any significant chest pain or palpitations, no syncope.  I asked her about cocaine use and she states that she has been staying away from this.  Reviewed her medications which are outlined below and stable from a cardiac perspective.  We did discuss obtaining follow-up lab work on low-dose amiodarone.  I reviewed her most recent ECG from October.  Past Medical History:  Diagnosis Date  . Alcohol abuse   . Arthritis   . Asthma   . Cocaine abuse ([place])   . Essential hypertension   . GERD (gastroesophageal reflux disease)   . Hepatitis C   . Hyperlipidemia   . Lichen sclerosus et atrophicus 10/13/2014  . Neuropathic pain   . Noncompliance   . PSVT (paroxysmal supraventricular tachycardia) ([place])   . Rash   . Renal carcinoma ([place])   . Sleep apnea   . Type 2 diabetes mellitus ([place])   . Vaginal irritation 10/01/2014  . Vaginal itching 10/01/2014  . Yeast infection of the vagina 10/01/2014    Past Surgical History:  Procedure Laterality Date  . ABDOMINAL HYSTERECTOMY    . COLONOSCOPY N/A 07/08/2013   RMR: Multiple colonic polyps treated [name] as described above. Inadequate prepartation comprimised examination there colonic diverticulosis. tubular and tubulovillous adenomas. next TCS 12/2013  . COLONOSCOPY N/A 02/09/2015   RMR: Colonic polyp removed as described above. Tubular adenoma. Next colonoscopy January 2022.  . ESOPHAGOGASTRODUODENOSCOPY N/A 07/08/2013   RMR: Schatzki's ring ; small hiatal hernia-status post passage of a Maloney dilator  . MALONEY DILATION N/A 07/08/2013   Procedure: [name]  DILATION;  Surgeon: [name], MD;  [facility];  Service: Endoscopy;  Laterality: N/A;  . ROBOT ASSISTED LAPAROSCOPIC NEPHRECTOMY Right 10/14/2013   Procedure: ROBOTIC ASSISTED LAPAROSCOPIC RADICAL NEPHRECTOMY;  Surgeon: [name], MD;  Location: [facility];  Service: Urology;  Laterality: Right;  . SAVORY DILATION N/A 07/08/2013   Procedure: SAVORY DILATION;  Surgeon: [name], MD;  [facility];  Service: Endoscopy;  Laterality: N/A;  . TONSILLECTOMY    . Wart removal      Current Outpatient Medications  Medication Sig Dispense Refill  . amiodarone (PACERONE) 100 MG tablet Take 1 tablet (100 mg total) by mouth daily. TAKE ONE TABLET BY MOUTH ONCE DAILY 90 tablet 1  . amLODipine (NORVASC) 10 MG tablet Take 1 tablet (10 mg total) by mouth daily. 30 tablet 1  . aspirin EC 81 MG EC tablet Take 1 tablet (81 mg total) by mouth daily.    Marland Kitchen atorvastatin (LIPITOR) 40 MG tablet Take 1 tablet (40 mg total) by mouth daily at 6 PM. 30 tablet 2  . carvedilol (COREG) 6.25 MG tablet Take 6.25 mg by mouth 2 (two) times daily.  0  . cloNIDine (CATAPRES) 0.2 MG tablet Take 0.2 mg by mouth 2 (two) times daily.  0  . furosemide (LASIX) 20 MG tablet Take 20 mg by mouth daily.    . furosemide (LASIX) 20 MG tablet Take 1 tablet (20 mg total) by mouth daily. 10 tablet 0  .  gabapentin (NEURONTIN) 600 MG tablet Take 1 tablet by mouth 3 (three) times daily.  0  . insulin detemir (LEVEMIR) 100 UNIT/ML injection Inject 0.1 mLs (10 Units total) into the skin daily. 10 mL 11  . LANTUS 100 UNIT/ML injection INJECT 9 UNITS UNDER THE SKIN ONCE DAILY  6  . lisinopril-hydrochlorothiazide (PRINZIDE,ZESTORETIC) 20-25 MG tablet Take 1 tablet by mouth 2 (two) times daily.    . pantoprazole (PROTONIX) 40 MG tablet Take 1 tablet (40 mg total) by mouth daily. Take 30 minutes before breakfast 90 tablet 3  . phenylephrine (SUDAFED PE) 10 MG TABS tablet Take 10 mg by mouth every 4 (four) hours as  needed.    . potassium chloride (K-DUR) 10 MEQ tablet Take 1 tablet (10 mEq total) by mouth daily. 7 tablet 0   No current facility-administered medications for this visit.    Allergies:  Sulfa antibiotics   Social History: The patient  reports that she has been smoking cigarettes. She has a 4.00 pack-year smoking history. She has never used smokeless tobacco. She reports that she drank alcohol. She reports that she has current or past drug history. Drugs: Marijuana and Cocaine.   ROS:  Please see the history of present illness. Otherwise, complete review of systems is positive for none.  All other systems are reviewed and negative.   Physical Exam: VS:  BP 126/78   Pulse 69   Ht 5' (1.524 m)   Wt 146 lb (66.2 kg)   SpO2 96%   BMI 28.51 kg/m , BMI Body mass index is 28.51 kg/m.  Wt Readings from Last 3 Encounters:  12/11/17 146 lb (66.2 kg)  10/29/17 139 lb (63 kg)  09/05/17 140 lb (63.5 kg)    General: Patient appears comfortable at rest. HEENT: Conjunctiva and lids normal, oropharynx clear. Neck: Supple, no elevated JVP or carotid bruits, no thyromegaly. Lungs: Clear to auscultation, nonlabored breathing at rest. Cardiac: Regular rate and rhythm, no S3, 2/6 systolic murmur. Abdomen: Soft, nontender, bowel sounds present. Extremities: No pitting edema, distal pulses 2+. Skin: Warm and dry. Musculoskeletal: No kyphosis. Neuropsychiatric: Alert and oriented x3, affect grossly appropriate.  ECG: I personally reviewed the tracing from 10/30/2017 which showed sinus rhythm with poor wide prolonged PR and QT intervals.  Recent Labwork: 05/21/2017: Magnesium 1.9 10/29/2017: ALT 27; AST 35; BUN 22; Creatinine, Ser 1.31; Hemoglobin 12.8; Platelets 321; Potassium 3.2; Sodium 139     Component Value Date/Time   CHOL 292 (H) 05/22/2017 0515   TRIG 159 (H) 05/22/2017 0515   HDL 64 05/22/2017 0515   CHOLHDL 4.6 05/22/2017 0515   VLDL 32 05/22/2017 0515   LDLCALC 196 (H) 05/22/2017  0515    Other Studies Reviewed Today:  Echocardiogram 05/23/2017: Study Conclusions  - Left ventricle: The cavity size was normal. Systolic function was normal. The estimated ejection fraction was in the range of 60% to 65%. Wall motion was normal; there were no regional wall motion abnormalities. Findings consistent with left ventricular diastolic dysfunction, grade indeterminate. Doppler parameters are consistent with high ventricular filling pressure. Mild concentric and moderate focal basal septal hypertrophy. - Aortic valve: Severely calcified annulus. Trileaflet. - Mitral valve: There was mild regurgitation. - Atrial septum: No defect or patent foramen ovale was identified.  30-day event monitor 05/30/2017: Representative strips from 30-day event monitor reviewed. Sinus rhythm was present, there were only 2 reported events. Heart rate ranged from 58 bpm up to 112 bpm with average heart rate 76 bpm. No obvious arrhythmias  or pauses.  Assessment and Plan:  1.  Recurrent PSVT, relatively quiescent recently.  She continues on low-dose amiodarone for rhythm suppression as well as Coreg.  Plan to obtain LFTs and TSH.  2.  Moderate internal carotid artery disease by Dopplers in May.  We referred her for vascular evaluation however she has not followed through on this.  Continue aspirin and statin therapy.  3.  History of polysubstance abuse including cocaine.  She denies any recent use.  4.  Essential hypertension, blood pressure is well controlled today on current regimen.  Keep follow-up with PCP.  Current medicines were reviewed with the patient today.   Orders Placed This Encounter  Procedures  . Hepatic function panel  . TSH    Disposition: Follow-up in 6 months.  Signed, Satira Sark, MD, [facility] 12/11/2017 2:49 PM    [place] [facility] [address], [place], [place] [address] Phone: [phone]; Fax: [phone]

## 2017-12-23 ENCOUNTER — Emergency Department (HOSPITAL_COMMUNITY): Payer: Medicare Other

## 2017-12-23 ENCOUNTER — Inpatient Hospital Stay (HOSPITAL_COMMUNITY)
Admission: EM | Admit: 2017-12-23 | Discharge: 2018-01-10 | DRG: 064 | Disposition: A | Discharge status: Skilled Nursing Facility | Payer: Medicare Other | Attending: Internal Medicine | Admitting: Internal Medicine

## 2017-12-23 ENCOUNTER — Inpatient Hospital Stay (HOSPITAL_COMMUNITY): Payer: Medicare Other

## 2017-12-23 ENCOUNTER — Other Ambulatory Visit: Payer: Self-pay

## 2017-12-23 ENCOUNTER — Encounter (HOSPITAL_COMMUNITY): Payer: Self-pay | Admitting: *Deleted

## 2017-12-23 DIAGNOSIS — K7460 Unspecified cirrhosis of liver: Secondary | ICD-10-CM | POA: Diagnosis present

## 2017-12-23 DIAGNOSIS — K219 Gastro-esophageal reflux disease without esophagitis: Secondary | ICD-10-CM | POA: Diagnosis not present

## 2017-12-23 DIAGNOSIS — Z8249 Family history of ischemic heart disease and other diseases of the circulatory system: Secondary | ICD-10-CM

## 2017-12-23 DIAGNOSIS — J9600 Acute respiratory failure, unspecified whether with hypoxia or hypercapnia: Secondary | ICD-10-CM | POA: Diagnosis not present

## 2017-12-23 DIAGNOSIS — I471 Supraventricular tachycardia, unspecified: Secondary | ICD-10-CM | POA: Diagnosis present

## 2017-12-23 DIAGNOSIS — E870 Hyperosmolality and hypernatremia: Secondary | ICD-10-CM | POA: Diagnosis not present

## 2017-12-23 DIAGNOSIS — I6521 Occlusion and stenosis of right carotid artery: Secondary | ICD-10-CM | POA: Diagnosis present

## 2017-12-23 DIAGNOSIS — I10 Essential (primary) hypertension: Secondary | ICD-10-CM | POA: Diagnosis present

## 2017-12-23 DIAGNOSIS — Z720 Tobacco use: Secondary | ICD-10-CM | POA: Diagnosis present

## 2017-12-23 DIAGNOSIS — R29706 NIHSS score 6: Secondary | ICD-10-CM | POA: Diagnosis present

## 2017-12-23 DIAGNOSIS — I429 Cardiomyopathy, unspecified: Secondary | ICD-10-CM | POA: Diagnosis present

## 2017-12-23 DIAGNOSIS — Z800 Family history of malignant neoplasm of digestive organs: Secondary | ICD-10-CM

## 2017-12-23 DIAGNOSIS — G8194 Hemiplegia, unspecified affecting left nondominant side: Secondary | ICD-10-CM | POA: Diagnosis present

## 2017-12-23 DIAGNOSIS — R4587 Impulsiveness: Secondary | ICD-10-CM | POA: Diagnosis not present

## 2017-12-23 DIAGNOSIS — Z8679 Personal history of other diseases of the circulatory system: Secondary | ICD-10-CM | POA: Diagnosis not present

## 2017-12-23 DIAGNOSIS — J690 Pneumonitis due to inhalation of food and vomit: Secondary | ICD-10-CM | POA: Diagnosis not present

## 2017-12-23 DIAGNOSIS — L900 Lichen sclerosus et atrophicus: Secondary | ICD-10-CM | POA: Diagnosis present

## 2017-12-23 DIAGNOSIS — Z882 Allergy status to sulfonamides status: Secondary | ICD-10-CM

## 2017-12-23 DIAGNOSIS — I340 Nonrheumatic mitral (valve) insufficiency: Secondary | ICD-10-CM | POA: Diagnosis not present

## 2017-12-23 DIAGNOSIS — G4733 Obstructive sleep apnea (adult) (pediatric): Secondary | ICD-10-CM | POA: Diagnosis present

## 2017-12-23 DIAGNOSIS — I129 Hypertensive chronic kidney disease with stage 1 through stage 4 chronic kidney disease, or unspecified chronic kidney disease: Secondary | ICD-10-CM | POA: Diagnosis present

## 2017-12-23 DIAGNOSIS — F10239 Alcohol dependence with withdrawal, unspecified: Secondary | ICD-10-CM | POA: Diagnosis not present

## 2017-12-23 DIAGNOSIS — F1410 Cocaine abuse, uncomplicated: Secondary | ICD-10-CM | POA: Diagnosis not present

## 2017-12-23 DIAGNOSIS — I639 Cerebral infarction, unspecified: Principal | ICD-10-CM | POA: Diagnosis present

## 2017-12-23 DIAGNOSIS — Z7982 Long term (current) use of aspirin: Secondary | ICD-10-CM

## 2017-12-23 DIAGNOSIS — E1169 Type 2 diabetes mellitus with other specified complication: Secondary | ICD-10-CM | POA: Diagnosis not present

## 2017-12-23 DIAGNOSIS — Z23 Encounter for immunization: Secondary | ICD-10-CM

## 2017-12-23 DIAGNOSIS — B182 Chronic viral hepatitis C: Secondary | ICD-10-CM | POA: Diagnosis present

## 2017-12-23 DIAGNOSIS — R4189 Other symptoms and signs involving cognitive functions and awareness: Secondary | ICD-10-CM | POA: Diagnosis present

## 2017-12-23 DIAGNOSIS — Z87440 Personal history of urinary (tract) infections: Secondary | ICD-10-CM

## 2017-12-23 DIAGNOSIS — J9690 Respiratory failure, unspecified, unspecified whether with hypoxia or hypercapnia: Secondary | ICD-10-CM

## 2017-12-23 DIAGNOSIS — Z8673 Personal history of transient ischemic attack (TIA), and cerebral infarction without residual deficits: Secondary | ICD-10-CM

## 2017-12-23 DIAGNOSIS — M1990 Unspecified osteoarthritis, unspecified site: Secondary | ICD-10-CM | POA: Diagnosis present

## 2017-12-23 DIAGNOSIS — D72829 Elevated white blood cell count, unspecified: Secondary | ICD-10-CM | POA: Diagnosis not present

## 2017-12-23 DIAGNOSIS — K7030 Alcoholic cirrhosis of liver without ascites: Secondary | ICD-10-CM | POA: Diagnosis present

## 2017-12-23 DIAGNOSIS — Z85048 Personal history of other malignant neoplasm of rectum, rectosigmoid junction, and anus: Secondary | ICD-10-CM

## 2017-12-23 DIAGNOSIS — I6601 Occlusion and stenosis of right middle cerebral artery: Secondary | ICD-10-CM | POA: Diagnosis present

## 2017-12-23 DIAGNOSIS — R072 Precordial pain: Secondary | ICD-10-CM

## 2017-12-23 DIAGNOSIS — N183 Chronic kidney disease, stage 3 unspecified: Secondary | ICD-10-CM | POA: Diagnosis present

## 2017-12-23 DIAGNOSIS — E782 Mixed hyperlipidemia: Secondary | ICD-10-CM | POA: Diagnosis present

## 2017-12-23 DIAGNOSIS — Z515 Encounter for palliative care: Secondary | ICD-10-CM

## 2017-12-23 DIAGNOSIS — G9340 Encephalopathy, unspecified: Secondary | ICD-10-CM | POA: Diagnosis present

## 2017-12-23 DIAGNOSIS — Z7189 Other specified counseling: Secondary | ICD-10-CM | POA: Diagnosis not present

## 2017-12-23 DIAGNOSIS — F17213 Nicotine dependence, cigarettes, with withdrawal: Secondary | ICD-10-CM | POA: Diagnosis not present

## 2017-12-23 DIAGNOSIS — R471 Dysarthria and anarthria: Secondary | ICD-10-CM | POA: Diagnosis present

## 2017-12-23 DIAGNOSIS — F1423 Cocaine dependence with withdrawal: Secondary | ICD-10-CM | POA: Diagnosis not present

## 2017-12-23 DIAGNOSIS — R51 Headache: Secondary | ICD-10-CM

## 2017-12-23 DIAGNOSIS — R519 Headache, unspecified: Secondary | ICD-10-CM

## 2017-12-23 DIAGNOSIS — G4730 Sleep apnea, unspecified: Secondary | ICD-10-CM | POA: Diagnosis present

## 2017-12-23 DIAGNOSIS — Z833 Family history of diabetes mellitus: Secondary | ICD-10-CM

## 2017-12-23 DIAGNOSIS — E1122 Type 2 diabetes mellitus with diabetic chronic kidney disease: Secondary | ICD-10-CM | POA: Diagnosis present

## 2017-12-23 DIAGNOSIS — R531 Weakness: Secondary | ICD-10-CM

## 2017-12-23 DIAGNOSIS — E876 Hypokalemia: Secondary | ICD-10-CM | POA: Diagnosis present

## 2017-12-23 DIAGNOSIS — R079 Chest pain, unspecified: Secondary | ICD-10-CM

## 2017-12-23 DIAGNOSIS — J449 Chronic obstructive pulmonary disease, unspecified: Secondary | ICD-10-CM | POA: Diagnosis present

## 2017-12-23 DIAGNOSIS — Z905 Acquired absence of kidney: Secondary | ICD-10-CM

## 2017-12-23 DIAGNOSIS — Z79899 Other long term (current) drug therapy: Secondary | ICD-10-CM

## 2017-12-23 DIAGNOSIS — R7989 Other specified abnormal findings of blood chemistry: Secondary | ICD-10-CM | POA: Diagnosis not present

## 2017-12-23 DIAGNOSIS — R9431 Abnormal electrocardiogram [ECG] [EKG]: Secondary | ICD-10-CM | POA: Diagnosis not present

## 2017-12-23 DIAGNOSIS — T17908D Unspecified foreign body in respiratory tract, part unspecified causing other injury, subsequent encounter: Secondary | ICD-10-CM | POA: Diagnosis not present

## 2017-12-23 DIAGNOSIS — E119 Type 2 diabetes mellitus without complications: Secondary | ICD-10-CM

## 2017-12-23 DIAGNOSIS — Z85528 Personal history of other malignant neoplasm of kidney: Secondary | ICD-10-CM

## 2017-12-23 DIAGNOSIS — Z794 Long term (current) use of insulin: Secondary | ICD-10-CM

## 2017-12-23 DIAGNOSIS — E1165 Type 2 diabetes mellitus with hyperglycemia: Secondary | ICD-10-CM | POA: Diagnosis present

## 2017-12-23 DIAGNOSIS — B1920 Unspecified viral hepatitis C without hepatic coma: Secondary | ICD-10-CM | POA: Diagnosis present

## 2017-12-23 DIAGNOSIS — Z90710 Acquired absence of both cervix and uterus: Secondary | ICD-10-CM

## 2017-12-23 DIAGNOSIS — T17908A Unspecified foreign body in respiratory tract, part unspecified causing other injury, initial encounter: Secondary | ICD-10-CM

## 2017-12-23 LAB — BASIC METABOLIC PANEL
ANION GAP: 10 (ref 5–15)
BUN: 22 mg/dL (ref 8–23)
CO2: 31 mmol/L (ref 22–32)
CREATININE: 1.48 mg/dL — AB (ref 0.44–1.00)
Calcium: 8.8 mg/dL — ABNORMAL LOW (ref 8.9–10.3)
Chloride: 99 mmol/L (ref 98–111)
GFR calc Af Amer: 42 mL/min — ABNORMAL LOW (ref >60)
GFR calc non Af Amer: 37 mL/min — ABNORMAL LOW (ref >60)
Glucose, Bld: 128 mg/dL — ABNORMAL HIGH (ref 70–99)
POTASSIUM: 2.8 mmol/L — AB (ref 3.5–5.1)
SODIUM: 140 mmol/L (ref 135–145)

## 2017-12-23 LAB — I-STAT CHEM 8, ED
BUN: 23 mg/dL (ref 8–23)
CHLORIDE: 99 mmol/L (ref 98–111)
Calcium, Ion: 1.12 mmol/L — ABNORMAL LOW (ref 1.15–1.40)
Creatinine, Ser: 1.4 mg/dL — ABNORMAL HIGH (ref 0.44–1.00)
Glucose, Bld: 126 mg/dL — ABNORMAL HIGH (ref 70–99)
HEMATOCRIT: 41 % (ref 36.0–46.0)
Hemoglobin: 13.9 g/dL (ref 12.0–15.0)
POTASSIUM: 3 mmol/L — AB (ref 3.5–5.1)
SODIUM: 140 mmol/L (ref 135–145)
TCO2: 32 mmol/L (ref 22–32)

## 2017-12-23 LAB — CBC WITH DIFFERENTIAL/PLATELET
Abs Immature Granulocytes: 0.04 10*3/uL (ref 0.00–0.07)
BASOS ABS: 0.1 10*3/uL (ref 0.0–0.1)
Basophils Relative: 1 %
Eosinophils Absolute: 0.1 10*3/uL (ref 0.0–0.5)
Eosinophils Relative: 1 %
HEMATOCRIT: 41.3 % (ref 36.0–46.0)
HEMOGLOBIN: 12.7 g/dL (ref 12.0–15.0)
IMMATURE GRANULOCYTES: 0 %
LYMPHS ABS: 3.1 10*3/uL (ref 0.7–4.0)
LYMPHS PCT: 28 %
MCH: 27.4 pg (ref 26.0–34.0)
MCHC: 30.8 g/dL (ref 30.0–36.0)
MCV: 89 fL (ref 80.0–100.0)
MONOS PCT: 9 %
Monocytes Absolute: 1 10*3/uL (ref 0.1–1.0)
NEUTROS PCT: 61 %
NRBC: 0 % (ref 0.0–0.2)
Neutro Abs: 6.8 10*3/uL (ref 1.7–7.7)
Platelets: 375 10*3/uL (ref 150–400)
RBC: 4.64 MIL/uL (ref 3.87–5.11)
RDW: 12.8 % (ref 11.5–15.5)
WBC: 11.2 10*3/uL — ABNORMAL HIGH (ref 4.0–10.5)

## 2017-12-23 LAB — RAPID URINE DRUG SCREEN, HOSP PERFORMED
Amphetamines: NOT DETECTED
Barbiturates: NOT DETECTED
Benzodiazepines: NOT DETECTED
Cocaine: POSITIVE — AB
Opiates: NOT DETECTED
Tetrahydrocannabinol: NOT DETECTED

## 2017-12-23 LAB — MAGNESIUM: Magnesium: 1.9 mg/dL (ref 1.7–2.4)

## 2017-12-23 LAB — I-STAT TROPONIN, ED: Troponin i, poc: 0.08 ng/mL (ref 0.00–0.08)

## 2017-12-23 LAB — HEMOGLOBIN A1C
Hgb A1c MFr Bld: 6.7 % — ABNORMAL HIGH (ref 4.8–5.6)
Mean Plasma Glucose: 145.59 mg/dL

## 2017-12-23 LAB — GLUCOSE, CAPILLARY
Glucose-Capillary: 141 mg/dL — ABNORMAL HIGH (ref 70–99)
Glucose-Capillary: 110 mg/dL — ABNORMAL HIGH (ref 70–99)

## 2017-12-23 LAB — TROPONIN I: Troponin I: 0.07 ng/mL (ref <0.03)

## 2017-12-23 MED ORDER — ACETAMINOPHEN 160 MG/5ML PO SOLN: 650.0000 mg | ORAL | Status: DC | PRN

## 2017-12-23 MED ORDER — LORAZEPAM 1 MG PO TABS
1.0000 mg | ORAL_TABLET | Freq: Four times a day (QID) | ORAL | Status: DC | PRN
  Administered 2017-12-24 (×2): 1 mg via ORAL
  Filled 2017-12-23 (×2): qty 1

## 2017-12-23 MED ORDER — ENOXAPARIN SODIUM 40 MG/0.4ML ~~LOC~~ SOLN
40.0000 mg | SUBCUTANEOUS | Status: DC
  Administered 2017-12-23: 40 mg via SUBCUTANEOUS
  Filled 2017-12-23: qty 0.4

## 2017-12-23 MED ORDER — INSULIN ASPART 100 UNIT/ML ~~LOC~~ SOLN
3.0000 [IU] | Freq: Three times a day (TID) | SUBCUTANEOUS | Status: DC
  Administered 2017-12-23 – 2017-12-24 (×2): 3 [IU] via SUBCUTANEOUS

## 2017-12-23 MED ORDER — LORAZEPAM 2 MG/ML IJ SOLN
1.0000 mg | Freq: Four times a day (QID) | INTRAMUSCULAR | Status: DC | PRN
  Administered 2017-12-24 – 2017-12-25 (×2): 1 mg via INTRAVENOUS
  Filled 2017-12-23 (×2): qty 1

## 2017-12-23 MED ORDER — ASPIRIN 81 MG PO CHEW
324.0000 mg | CHEWABLE_TABLET | Freq: Once | ORAL | Status: AC
  Administered 2017-12-23: 324 mg via ORAL
  Filled 2017-12-23: qty 4

## 2017-12-23 MED ORDER — SODIUM CHLORIDE 0.9 % IV BOLUS
1000.0000 mL | Freq: Once | INTRAVENOUS | Status: AC
  Administered 2017-12-23: 1000 mL via INTRAVENOUS

## 2017-12-23 MED ORDER — INSULIN ASPART 100 UNIT/ML ~~LOC~~ SOLN: 0.0000 [IU] | Freq: Every day | SUBCUTANEOUS | Status: DC

## 2017-12-23 MED ORDER — INSULIN ASPART 100 UNIT/ML ~~LOC~~ SOLN
0.0000 [IU] | Freq: Three times a day (TID) | SUBCUTANEOUS | Status: DC
  Administered 2017-12-23 – 2017-12-25 (×4): 1 [IU] via SUBCUTANEOUS

## 2017-12-23 MED ORDER — ACETAMINOPHEN 325 MG PO TABS
650.0000 mg | ORAL_TABLET | ORAL | Status: DC | PRN
  Administered 2017-12-24: 650 mg via ORAL
  Filled 2017-12-23: qty 2

## 2017-12-23 MED ORDER — SENNOSIDES-DOCUSATE SODIUM 8.6-50 MG PO TABS: 1.0000 | ORAL_TABLET | Freq: Every evening | ORAL | Status: DC | PRN

## 2017-12-23 MED ORDER — PNEUMOCOCCAL VAC POLYVALENT 25 MCG/0.5ML IJ INJ
0.5000 mL | INJECTION | INTRAMUSCULAR | Status: AC
  Administered 2017-12-24: 0.5 mL via INTRAMUSCULAR
  Filled 2017-12-23: qty 0.5

## 2017-12-23 MED ORDER — VITAMIN B-1 100 MG PO TABS
100.0000 mg | ORAL_TABLET | Freq: Every day | ORAL | Status: DC
  Administered 2017-12-23 – 2018-01-10 (×8): 100 mg via ORAL
  Filled 2017-12-23 (×9): qty 1

## 2017-12-23 MED ORDER — METOCLOPRAMIDE HCL 5 MG/ML IJ SOLN
10.0000 mg | Freq: Once | INTRAMUSCULAR | Status: AC
  Administered 2017-12-23: 10 mg via INTRAVENOUS
  Filled 2017-12-23: qty 2

## 2017-12-23 MED ORDER — ADULT MULTIVITAMIN W/MINERALS CH
1.0000 | ORAL_TABLET | Freq: Every day | ORAL | Status: DC
  Administered 2017-12-23 – 2018-01-10 (×11): 1 via ORAL
  Filled 2017-12-23 (×12): qty 1

## 2017-12-23 MED ORDER — NITROGLYCERIN 0.4 MG SL SUBL
0.4000 mg | SUBLINGUAL_TABLET | SUBLINGUAL | Status: DC | PRN
  Administered 2017-12-23: 0.4 mg via SUBLINGUAL
  Filled 2017-12-23: qty 1

## 2017-12-23 MED ORDER — POTASSIUM CHLORIDE CRYS ER 20 MEQ PO TBCR
60.0000 meq | EXTENDED_RELEASE_TABLET | Freq: Once | ORAL | Status: AC
  Administered 2017-12-23: 60 meq via ORAL
  Filled 2017-12-23: qty 3

## 2017-12-23 MED ORDER — STROKE: EARLY STAGES OF RECOVERY BOOK
Freq: Once | Status: AC
  Administered 2017-12-23: 14:00:00
  Filled 2017-12-23: qty 1

## 2017-12-23 MED ORDER — NITROGLYCERIN 0.4 MG SL SUBL: 0.4000 mg | SUBLINGUAL_TABLET | SUBLINGUAL | Status: DC | PRN

## 2017-12-23 MED ORDER — ATORVASTATIN CALCIUM 40 MG PO TABS
40.0000 mg | ORAL_TABLET | Freq: Every day | ORAL | Status: DC
  Administered 2017-12-23: 40 mg via ORAL
  Filled 2017-12-23: qty 1

## 2017-12-23 MED ORDER — INFLUENZA VAC SPLIT HIGH-DOSE 0.5 ML IM SUSY
0.5000 mL | PREFILLED_SYRINGE | INTRAMUSCULAR | Status: DC
  Filled 2017-12-23: qty 0.5

## 2017-12-23 MED ORDER — POTASSIUM CHLORIDE 10 MEQ/100ML IV SOLN
10.0000 meq | Freq: Once | INTRAVENOUS | Status: AC
  Administered 2017-12-23: 10 meq via INTRAVENOUS
  Filled 2017-12-23: qty 100

## 2017-12-23 MED ORDER — ASPIRIN 300 MG RE SUPP
300.0000 mg | Freq: Every day | RECTAL | Status: DC
  Administered 2017-12-26 – 2018-01-02 (×7): 300 mg via RECTAL
  Filled 2017-12-23 (×10): qty 1

## 2017-12-23 MED ORDER — ASPIRIN 325 MG PO TABS
325.0000 mg | ORAL_TABLET | Freq: Every day | ORAL | Status: DC
  Administered 2017-12-24 – 2018-01-10 (×10): 325 mg via ORAL
  Filled 2017-12-23 (×11): qty 1

## 2017-12-23 MED ORDER — DIPHENHYDRAMINE HCL 50 MG/ML IJ SOLN
25.0000 mg | Freq: Once | INTRAMUSCULAR | Status: AC
  Administered 2017-12-23: 25 mg via INTRAVENOUS
  Filled 2017-12-23: qty 1

## 2017-12-23 MED ORDER — POTASSIUM CHLORIDE IN NACL 40-0.9 MEQ/L-% IV SOLN
INTRAVENOUS | Status: DC
  Administered 2017-12-23: 50 mL/h via INTRAVENOUS

## 2017-12-23 MED ORDER — FOLIC ACID 1 MG PO TABS
1.0000 mg | ORAL_TABLET | Freq: Every day | ORAL | Status: DC
  Administered 2017-12-23 – 2017-12-25 (×3): 1 mg via ORAL
  Filled 2017-12-23 (×3): qty 1

## 2017-12-23 MED ORDER — THIAMINE HCL 100 MG/ML IJ SOLN
100.0000 mg | Freq: Every day | INTRAMUSCULAR | Status: DC
  Administered 2017-12-26 – 2018-01-04 (×10): 100 mg via INTRAVENOUS
  Filled 2017-12-23 (×11): qty 2

## 2017-12-23 MED ORDER — ACETAMINOPHEN 650 MG RE SUPP
650.0000 mg | RECTAL | Status: DC | PRN
  Administered 2017-12-28: 650 mg via RECTAL
  Filled 2017-12-23: qty 1

## 2017-12-23 MED ORDER — GADOBUTROL 1 MMOL/ML IV SOLN
6.0000 mL | Freq: Once | INTRAVENOUS | Status: AC | PRN
  Administered 2017-12-23: 6 mL via INTRAVENOUS

## 2017-12-23 MED ORDER — CLONIDINE HCL 0.1 MG PO TABS
0.1000 mg | ORAL_TABLET | Freq: Two times a day (BID) | ORAL | Status: DC
  Administered 2017-12-23 – 2017-12-25 (×4): 0.1 mg via ORAL
  Filled 2017-12-23 (×6): qty 1

## 2017-12-23 MED ORDER — HYDRALAZINE HCL 20 MG/ML IJ SOLN
10.0000 mg | INTRAMUSCULAR | Status: DC | PRN
  Administered 2017-12-24 – 2017-12-25 (×4): 10 mg via INTRAVENOUS
  Filled 2017-12-23 (×4): qty 1

## 2017-12-23 NOTE — H&P (Addendum)
History and Physical  Cassandra Jordan KDT:[id number] DOB: [birth year] DOA: 12/23/2017  Referring physician: Reather Converse MD PCP: [facility]   Chief Complaint: headache Pt is poor historian  HPI: Cassandra Jordan is a 66 y.o. female chronic cocaine abuser, smoker, diabetic, with known cerebrovascular and heart disease presented to ED with complaints of a frontal headache that started this morning.  The patient has since resolved but she initially describe pain as a dull headache 9/10 associtated with mild nausea and photophobia.  She also reported dull 8/10 chest pain and pressure described as achy dull pain in upper sternal area.  She was given nitroglycerin in ED and now she says she does not have chest pain.  She says she last used cocaine yesterday evening around 7pm.  She denies shortness of breath.  She has a strong family history of heart disease and premature cardiac death in her father that does in 29s of heart disease.   ED Course: The patient was noted to have mild troponin elevation.  Pt was noted to have an abnormal CT brain suggesting acute/subacute CVA.  The patient was seen by the inpatient cardiology service due to acutely abnormal EKG with new large t-wave inversion in anterolateral leads.  The patient is being admitted for further management.   Review of Systems: UTO due to patient's lethargy.  Past Medical History:  Diagnosis Date  . Alcohol abuse   . Arthritis   . Asthma   . Cocaine abuse ([place])   . Essential hypertension   . GERD (gastroesophageal reflux disease)   . Hepatitis C   . Hyperlipidemia   . Lichen sclerosus et atrophicus 10/13/2014  . Neuropathic pain   . Noncompliance   . PSVT (paroxysmal supraventricular tachycardia) ([place])   . Rash   . Renal carcinoma ([place])   . Sleep apnea   . Type 2 diabetes mellitus ([place])   . Vaginal irritation 10/01/2014  . Vaginal itching 10/01/2014  . Yeast infection of the vagina 10/01/2014   Past Surgical  History:  Procedure Laterality Date  . ABDOMINAL HYSTERECTOMY    . COLONOSCOPY N/A 07/08/2013   RMR: Multiple colonic polyps treated [name] as described above. Inadequate prepartation comprimised examination there colonic diverticulosis. tubular and tubulovillous adenomas. next TCS 12/2013  . COLONOSCOPY N/A 02/09/2015   RMR: Colonic polyp removed as described above. Tubular adenoma. Next colonoscopy January 2022.  . ESOPHAGOGASTRODUODENOSCOPY N/A 07/08/2013   RMR: Schatzki's ring ; small hiatal hernia-status post passage of a Maloney dilator  . MALONEY DILATION N/A 07/08/2013   Procedure: [name] DILATION;  Surgeon: [name], MD;  [facility];  Service: Endoscopy;  Laterality: N/A;  . ROBOT ASSISTED LAPAROSCOPIC NEPHRECTOMY Right 10/14/2013   Procedure: ROBOTIC ASSISTED LAPAROSCOPIC RADICAL NEPHRECTOMY;  Surgeon: [name], MD;  [facility];  Service: Urology;  Laterality: Right;  . SAVORY DILATION N/A 07/08/2013   Procedure: SAVORY DILATION;  Surgeon: [name], MD;  [facility];  Service: Endoscopy;  Laterality: N/A;  . TONSILLECTOMY    . Wart removal     Social History:  reports that she has been smoking cigarettes. She has a 4.00 pack-year smoking history. She has never used smokeless tobacco. She reports that she drank alcohol. She reports that she has current or past drug history. Drugs: Marijuana and Cocaine.  Allergies  Allergen Reactions  . Sulfa Antibiotics Itching and Other (See Comments)    burning    Family History  Problem Relation Age of Onset  . Colon cancer Sister 7  . Cancer Mother   . Heart attack Father   . Hypertension Daughter   . Cancer Maternal Grandfather   . Diabetes Paternal Grandmother     Prior to Admission medications   Medication Sig Start Date End Date Taking? Authorizing Provider  amiodarone (PACERONE) 100 MG tablet Take 1 tablet (100 mg total) by mouth daily. TAKE ONE TABLET BY MOUTH ONCE DAILY 05/31/17    Satira Sark, MD  amLODipine (NORVASC) 10 MG tablet Take 1 tablet (10 mg total) by mouth daily. 11/04/16   Orson Eva, MD  aspirin EC 81 MG EC tablet Take 1 tablet (81 mg total) by mouth daily. 05/24/17   Isaac Bliss, Rayford Halsted, MD  atorvastatin (LIPITOR) 40 MG tablet Take 1 tablet (40 mg total) by mouth daily at 6 PM. 05/23/17   Isaac Bliss, Rayford Halsted, MD  carvedilol (COREG) 6.25 MG tablet Take 6.25 mg by mouth 2 (two) times daily. 08/21/17   [provider]  cloNIDine (CATAPRES) 0.2 MG tablet Take 0.2 mg by mouth 2 (two) times daily. 08/21/17   [provider]  furosemide (LASIX) 20 MG tablet Take 20 mg by mouth daily.    [provider]  furosemide (LASIX) 20 MG tablet Take 1 tablet (20 mg total) by mouth daily. 10/29/17   Nat Christen, MD  gabapentin (NEURONTIN) 600 MG tablet Take 1 tablet by mouth 3 (three) times daily. 04/17/17   [provider]  insulin detemir (LEVEMIR) 100 UNIT/ML injection Inject 0.1 mLs (10 Units total) into the skin daily. 05/24/17   Isaac Bliss, Rayford Halsted, MD  LANTUS 100 UNIT/ML injection INJECT 9 UNITS UNDER THE SKIN ONCE DAILY 08/21/17   [provider]  lisinopril-hydrochlorothiazide (PRINZIDE,ZESTORETIC) 20-25 MG tablet Take 1 tablet by mouth 2 (two) times daily.    [provider]  pantoprazole (PROTONIX) 40 MG tablet Take 1 tablet (40 mg total) by mouth daily. Take 30 minutes before breakfast 03/06/16   Annitta Needs, NP  phenylephrine (SUDAFED PE) 10 MG TABS tablet Take 10 mg by mouth every 4 (four) hours as needed.    [provider]  potassium chloride (K-DUR) 10 MEQ tablet Take 1 tablet (10 mEq total) by mouth daily. 09/05/17   Noemi Chapel, MD   Physical Exam: Vitals:   12/23/17 0641 12/23/17 0643 12/23/17 0700  BP:  (!) 189/97 (!) 193/98  Pulse:  79 74  Resp:  20 14  Temp:  98.6 F (37 C)   TempSrc:  Oral   SpO2:  96% 93%  Weight: 66.2 kg    Height: 5' (1.524 m)       General  exam: Pt lethargic but arousable.  Moderately built and nourished patient, lying comfortably supine on the gurney in no obvious distress.  Head, eyes and ENT: Nontraumatic and normocephalic. Pupils equally reacting to light and accommodation. Oral mucosa dry.  Neck: Supple. No JVD, carotid bruit or thyromegaly.  Lymphatics: No lymphadenopathy.  Respiratory system: Clear to auscultation. No increased work of breathing.  Cardiovascular system: S1 and S2 heard, RRR. No JVD, murmurs, gallops, clicks or pedal edema.  Gastrointestinal system: Abdomen is nondistended, soft and nontender. Normal bowel sounds heard. No organomegaly or masses appreciated.  Central nervous system: Alert and oriented. No focal neurological deficits.  Extremities: Symmetric 5 x 5 power. Peripheral pulses symmetrically felt.   Skin: No rashes or acute findings.  Musculoskeletal system: Negative exam.  Psychiatry:  Flat affect.  Labs on Admission:  Basic Metabolic Panel: Recent Labs  Lab 12/23/17 0755 12/23/17 0757  NA 140 140  K 2.8* 3.0*  CL 99 99  CO2 31  --   GLUCOSE 128* 126*  BUN 22 23  CREATININE 1.48* 1.40*  CALCIUM 8.8*  --   MG 1.9  --    Liver Function Tests: No results for input(s): AST, ALT, ALKPHOS, BILITOT, PROT, ALBUMIN in the last 168 hours. No results for input(s): LIPASE, AMYLASE in the last 168 hours. No results for input(s): AMMONIA in the last 168 hours. CBC: Recent Labs  Lab 12/23/17 0755 12/23/17 0757  WBC 11.2*  --   NEUTROABS 6.8  --   HGB 12.7 13.9  HCT 41.3 41.0  MCV 89.0  --   PLT 375  --    Cardiac Enzymes: No results for input(s): CKTOTAL, CKMB, CKMBINDEX, TROPONINI in the last 168 hours.  BNP (last 3 results) No results for input(s): PROBNP in the last 8760 hours. CBG: No results for input(s): GLUCAP in the last 168 hours.  Radiological Exams on Admission: Dg Chest 2 View  Result Date: 12/23/2017 CLINICAL DATA:  Chest pain this morning. EXAM: CHEST -  2 VIEW COMPARISON:  10/29/2017 FINDINGS: Lungs are adequately inflated without consolidation or effusion. Possible minimal vascular congestion. Cardiomediastinal silhouette and remainder of the exam is unchanged. IMPRESSION: Possible minimal vascular congestion. Electronically Signed   By: Marin Olp M.D.   On: 12/23/2017 08:31   Ct Head Wo Contrast  Result Date: 12/23/2017 CLINICAL DATA:  66 year old female with facial pain across forehead and nasal region. Chest pain. Rectal cancer. Diabetes and hypertension. Paroxysmal ventricular tachycardia. Initial encounter. EXAM: CT HEAD WITHOUT CONTRAST TECHNIQUE: Contiguous axial images were obtained from the base of the skull through the vertex without intravenous contrast. COMPARISON:  05/22/2017 brain MR. 05/21/2017 head CT. FINDINGS: Brain: Moderate size acute/subacute nonhemorrhagic left mid to inferior cerebellar infarct suspected. Three new rounded hypodensities within the right corona radiata. Findings may reflect result of interval infarcts given patient's history of multiple prior infarcts however, with history of rectal cancer, intracranial metastatic disease cannot be excluded. Moderate-to-marked chronic microvascular changes. Global atrophy. No intracranial hemorrhage. Vascular: Vascular calcifications.  No acute hyperdense vessel. Skull: No acute abnormality. Sinuses/Orbits: Visualized orbital structures reveal prior lens replacement without acute abnormality detected. Minimal mucosal thickening right sphenoid sinuses and ethmoid sinus air cells. Other: Visualized mastoid air cells and middle ear cavities are clear. IMPRESSION: 1. Moderate size acute/subacute nonhemorrhagic left mid to inferior cerebellar infarct suspected. 2. Three new rounded hypodensities within the right corona radiata. Findings may reflect result of interval infarcts given patient's history of multiple prior infarcts however, with history of rectal cancer, intracranial metastatic  disease cannot be excluded. Contrast-enhanced MR would prove helpful for further delineation if clinically desired. 3. Moderate-to-marked chronic microvascular changes. Global atrophy. These results were called by telephone at the time of interpretation on 12/23/2017 at 8:57 am to Dr. [name] , who verbally acknowledged these results. Electronically Signed   By: Genia Del M.D.   On: 12/23/2017 08:58    EKG: Independently reviewed. Marked t-wave inversions in anterolateral leads  Assessment/Plan Principal Problem:   Acute CVA (cerebrovascular accident) [facility]) Active Problems:   Essential hypertension   PSVT (paroxysmal supraventricular tachycardia) (HCC)   Chest pain   Diabetes mellitus, type II (HCC)   Tobacco abuse   Cocaine abuse ([place])   Generalized weakness   Hepatic cirrhosis ([place])   GERD (  gastroesophageal reflux disease)   Leukocytosis   Hypomagnesemia   Uncontrolled type 2 diabetes mellitus with hyperglycemia, with long-term current use of insulin (HCC)   CKD (chronic kidney disease), stage III (HCC)   Abnormal EKG   1. Acute CVA - MRI with and without contrast has been ordered and results are pending.  Admit to SDU for closer monitoring.  Allow permissive hypertension, avoid beta blockers with chronic cocaine abuse, continue aspirin for antiplatelet treatment, check lipid panel, A1c, 2D echocardiogram, neuro checks, PT/OT/SLP evaluation.   2. Chest pain - appreciate consult from cardiology team.  Will cycle troponins, check 2D echocardiogram, avoid beta blockers due to recent cocaine use.  3. PSVT - Pt has been on amiodarone and carvedilol for her symptoms.  Holding carvedilol due to recent cocaine.  4. Type 2 diabetes mellitus - carb modified diet, sliding scale coverage, follow CBGs. 5. Uncontrolled hypertension - will allow permissive hypertension in setting of acute CVA.  6. Leukocytosis - follow WBC / diff.  7. Stage 3 CKD - monitor renal function panel, avoid  nephrotoxins.   DVT Prophylaxis: lovenox Code Status: Full   Family Communication: none present   Disposition Plan: inpatient management    Critical care Time spent: 52 mins  Irwin Brakeman, MD [facility] Pager [phone]  If 7PM-7AM, please contact night-coverage [email or web address] Password [email or web address] 12/23/2017, 10:07 AM

## 2017-12-23 NOTE — ED Provider Notes (Signed)
[facility] EMERGENCY DEPARTMENT Provider Note   CSN: [id number] Arrival date & time: 12/23/17  1194     History   Chief Complaint Chief Complaint  Patient presents with  . Facial Pain    HPI Cassandra Jordan is a 67 y.o. female.   The history is provided by the patient.  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  She describes a dull pain which she rates at 9/10.  There is mild photophobia and mild nausea but no vomiting.  Nothing makes the headache any better.  Shortly before arriving at the hospital, she started having a dull, achy pain in her upper sternal area.  She rates this pain at 8/10.  There is no associated dyspnea or diaphoresis.  Nothing makes it better and nothing makes it worse.  She has not had pain like this before.  She is a smoker and also has positive family history of premature coronary atherosclerosis. Past Medical History:  Diagnosis Date  . Alcohol abuse   . Arthritis   . Asthma   . Cocaine abuse ([place])   . Essential hypertension   . GERD (gastroesophageal reflux disease)   . Hepatitis C   . Hyperlipidemia   . Lichen sclerosus et atrophicus 10/13/2014  . Neuropathic pain   . Noncompliance   . PSVT (paroxysmal supraventricular tachycardia) ([place])   . Rash   . Renal carcinoma ([place])   . Sleep apnea   . Type 2 diabetes mellitus ([place])   . Vaginal irritation 10/01/2014  . Vaginal itching 10/01/2014  . Yeast infection of the vagina 10/01/2014    Patient Active Problem List   Diagnosis Date Noted  . Hyperphosphatemia 05/22/2017  . Acute CVA (cerebrovascular accident) ([place]) 05/21/2017  . Abdominal pain 11/18/2016  . Cholelithiasis 11/18/2016  . Uncontrolled type 2 diabetes mellitus with hyperglycemia, with long-term current use of insulin ([place]) 11/18/2016  . CKD (chronic kidney disease), stage III ([place])  11/18/2016  . Acute pancreatitis   . Ileus ([place])   . Porcelain gallbladder   . Acute renal failure superimposed on stage 3 chronic kidney disease ([place]) 11/04/2016  . Hypokalemia 11/01/2016  . Hypomagnesemia 11/01/2016  . SBO (small bowel obstruction) ([place])   . AKI (acute kidney injury) ([place]) 10/30/2016  . Leukocytosis 10/30/2016  . GERD (gastroesophageal reflux disease) 10/29/2016  . Small bowel obstruction ([place]) 10/29/2016  . History of drug use 07/17/2016  . Constipation 05/30/2016  . Fecal incontinence 01/04/2016  . Hepatic cirrhosis ([place]) 04/08/2015  . History of adenomatous polyp of colon   . History of colonic polyps   . Hx of adenomatous colonic polyps 12/10/2014  . Lichen sclerosus et atrophicus 10/13/2014  . Vaginal itching 10/01/2014  . Vaginal irritation 10/01/2014  . Yeast infection of the vagina 10/01/2014  . Blurred vision, right eye 03/20/2014  . UTI (lower urinary tract infection) 03/20/2014  . Cocaine abuse ([place]) 03/20/2014  . Generalized weakness 03/20/2014  . Chest pain 02/10/2014  . Chest pain at rest 02/10/2014  . Type 2 diabetes mellitus ([place])   . Diabetes mellitus, type II ([place])   . Tobacco abuse   . PSVT (paroxysmal supraventricular tachycardia) ([place]) 11/04/2013  . Essential hypertension 10/16/2013  . Renal mass 10/14/2013  . Ovarian mass, left 08/17/2013  . Hepatitis C, chronic ([place]) 07/01/2013  . Elevated LFTs 07/01/2013  . Other dysphagia 07/01/2013  . Encounter for  screening colonoscopy 07/01/2013    Past Surgical History:  Procedure Laterality Date  . ABDOMINAL HYSTERECTOMY    . COLONOSCOPY N/A 07/08/2013   RMR: Multiple colonic polyps treated [name] as described above. Inadequate prepartation comprimised examination there colonic diverticulosis. tubular and tubulovillous adenomas. next TCS 12/2013  . COLONOSCOPY N/A 02/09/2015   RMR: Colonic polyp removed as described above. Tubular adenoma. Next colonoscopy January 2022.  .  ESOPHAGOGASTRODUODENOSCOPY N/A 07/08/2013   RMR: Schatzki's ring ; small hiatal hernia-status post passage of a Maloney dilator  . MALONEY DILATION N/A 07/08/2013   Procedure: [name] DILATION;  Surgeon: [name], MD;  Location: [facility] ENDO SUITE;  Service: Endoscopy;  Laterality: N/A;  . ROBOT ASSISTED LAPAROSCOPIC NEPHRECTOMY Right 10/14/2013   Procedure: ROBOTIC ASSISTED LAPAROSCOPIC RADICAL NEPHRECTOMY;  Surgeon: [name], MD;  Location: [facility] ORS;  Service: Urology;  Laterality: Right;  . SAVORY DILATION N/A 07/08/2013   Procedure: SAVORY DILATION;  Surgeon: [name], MD;  Location: [facility] ENDO SUITE;  Service: Endoscopy;  Laterality: N/A;  . TONSILLECTOMY    . Wart removal       OB History    Gravida  1   Para  1   Term      Preterm      AB      Living  1     SAB      TAB      Ectopic      Multiple      Live Births               Home Medications    Prior to Admission medications   Medication Sig Start Date End Date Taking? Authorizing Provider  amiodarone (PACERONE) 100 MG tablet Take 1 tablet (100 mg total) by mouth daily. TAKE ONE TABLET BY MOUTH ONCE DAILY 05/31/17   Satira Sark, MD  amLODipine (NORVASC) 10 MG tablet Take 1 tablet (10 mg total) by mouth daily. 11/04/16   Orson Eva, MD  aspirin EC 81 MG EC tablet Take 1 tablet (81 mg total) by mouth daily. 05/24/17   Isaac Bliss, Rayford Halsted, MD  atorvastatin (LIPITOR) 40 MG tablet Take 1 tablet (40 mg total) by mouth daily at 6 PM. 05/23/17   Isaac Bliss, Rayford Halsted, MD  carvedilol (COREG) 6.25 MG tablet Take 6.25 mg by mouth 2 (two) times daily. 08/21/17   [provider]  cloNIDine (CATAPRES) 0.2 MG tablet Take 0.2 mg by mouth 2 (two) times daily. 08/21/17   [provider]  furosemide (LASIX) 20 MG tablet Take 20 mg by mouth daily.    [provider]  furosemide (LASIX) 20 MG tablet Take 1 tablet (20 mg total) by mouth daily. 10/29/17   Nat Christen, MD  gabapentin  (NEURONTIN) 600 MG tablet Take 1 tablet by mouth 3 (three) times daily. 04/17/17   [provider]  insulin detemir (LEVEMIR) 100 UNIT/ML injection Inject 0.1 mLs (10 Units total) into the skin daily. 05/24/17   Isaac Bliss, Rayford Halsted, MD  LANTUS 100 UNIT/ML injection INJECT 9 UNITS UNDER THE SKIN ONCE DAILY 08/21/17   [provider]  lisinopril-hydrochlorothiazide (PRINZIDE,ZESTORETIC) 20-25 MG tablet Take 1 tablet by mouth 2 (two) times daily.    [provider]  pantoprazole (PROTONIX) 40 MG tablet Take 1 tablet (40 mg total) by mouth daily. Take 30 minutes before breakfast 03/06/16   Annitta Needs, NP  phenylephrine (SUDAFED PE) 10 MG TABS tablet Take 10 mg by mouth  every 4 (four) hours as needed.    [provider]  potassium chloride (K-DUR) 10 MEQ tablet Take 1 tablet (10 mEq total) by mouth daily. 09/05/17   Noemi Chapel, MD    Family History Family History  Problem Relation Age of Onset  . Colon cancer Sister 46  . Cancer Mother   . Heart attack Father   . Hypertension Daughter   . Cancer Maternal Grandfather   . Diabetes Paternal Grandmother     Social History Social History   Tobacco Use  . Smoking status: Current Some Day Smoker    Packs/day: 0.10    Years: 40.00    Pack years: 4.00    Types: Cigarettes  . Smokeless tobacco: Never Used  Substance Use Topics  . Alcohol use: Not Currently    Alcohol/week: 0.0 standard drinks    Comment: rarely  . Drug use: Yes    Types: Marijuana, Cocaine    Comment: last used yesterday     Allergies   Sulfa antibiotics   Review of Systems Review of Systems  All other systems reviewed and are negative.    Physical Exam Updated Vital Signs BP (!) 189/97   Pulse 79   Temp 98.6 F (37 C) (Oral)   Resp 20   Ht 5' (1.524 m)   Wt 66.2 kg   SpO2 96%   BMI 28.50 kg/m    Physical Exam  Nursing note and vitals reviewed.  67 year old female, resting comfortably and in no acute  distress. Vital signs are significant for elevated blood pressure. Oxygen saturation is 96%, which is normal. Head is normocephalic and atraumatic. PERRLA, EOMI. Oropharynx is clear. Neck is nontender and supple without adenopathy or JVD. Back is nontender and there is no CVA tenderness. Lungs are clear without rales, wheezes, or rhonchi. Chest has mild bilateral parasternal tenderness. Heart has regular rate and rhythm with 2/6 systolic ejection murmur heard along the left sternal border. Abdomen is soft, flat, nontender without masses or hepatosplenomegaly and peristalsis is normoactive. Extremities have no cyanosis or edema, full range of motion is present. Skin is warm and dry without rash. Neurologic: Mental status is normal, cranial nerves are intact, there are no motor or sensory deficits.  ED Treatments / Results  Labs (all labs ordered are listed, but only abnormal results are displayed) Labs Reviewed - No data to display  EKG EKG Interpretation  Date/Time:  Monday December 23 2017 06:59:50 EST Ventricular Rate:  75 PR Interval:    QRS Duration: 93 QT Interval:  538 QTC Calculation: 602 R Axis:   55 Text Interpretation:  Sinus rhythm Abnormal T, probable ischemia, widespread Prolonged QT interval Baseline wander in lead(s) V5 V6 When compared with ECG of 10/29/2017, QT has lengthened T wave abnormality is now present Confirmed by Delora Fuel ([id number]) on 12/23/2017 7:05:05 AM   Radiology No results found.  Procedures Procedures   Medications Ordered in ED Medications  sodium chloride 0.9 % bolus 1,000 mL (has no administration in time range)  metoCLOPramide (REGLAN) injection 10 mg (has no administration in time range)  diphenhydrAMINE (BENADRYL) injection 25 mg (has no administration in time range)  aspirin chewable tablet 324 mg (has no administration in time range)     Initial Impression / Assessment and Plan / ED Course  I have reviewed the triage vital signs  and the nursing notes.  Pertinent labs & imaging results that were available during my care of the patient were reviewed by  me and considered in my medical decision making (see chart for details).  Headache which seems benign.  Chest pain of uncertain cause.  She does have significant risk factors for coronary artery disease.  Old records are reviewed, and she has no relevant past visits.  She does have ED visits for paroxysmal supraventricular tachycardia.  She will be given a headache cocktail of normal saline, metoclopramide, diphenhydramine and will be given oral aspirin.  She will need to be held in the ED for a delta troponin.  Screening labs have been ordered including initial troponin.    ECG shows rather dramatic T wave inversion with some ST depression in diffuse leads.  This is a new ECG finding.  In addition aspirin, she will be given nitroglycerin.  Case is signed out to Dr. Reather Converse.  Final Clinical Impressions(s) / ED Diagnoses   Final diagnoses:  Headache, unspecified headache type  Nonspecific chest pain    ED Discharge Orders    None      Delora Fuel, MD 93/96/88 [phone]

## 2017-12-23 NOTE — ED Triage Notes (Signed)
Pt states that she started having facial pain across the forehead and nasal area this am, with runny nose,

## 2017-12-23 NOTE — Progress Notes (Signed)
CRITICAL VALUE ALERT  Critical Value:  Trop 0.07  Date & Time Notied:  12/23/17 @ 1341  Provider Notified: Dr. Wynetta Emery  Orders Received/Actions taken: cardiology already consulted. No new order at this time.

## 2017-12-23 NOTE — ED Notes (Signed)
Patient transported to X-ray 

## 2017-12-23 NOTE — Progress Notes (Signed)
Patient's daughter called. Stated that patient would not understand what we told her as far as what is going on with her stay in the hospital. Patient's daughter said to call her, the daughter, to update what is going on. I told her the doctor will round at about 0700. So that is when we will know more. The daughter's number is in the chart.

## 2017-12-23 NOTE — Progress Notes (Signed)
SLP Cancellation Note  Patient Details Name: Cassandra Jordan MRN: [id number] DOB: 1950/07/07   Cancelled treatment:       Reason Eval/Treat Not Completed: Patient at procedure or test/unavailable. ST will re-attempt as schedule allows later today.  Amelia H. Roddie Mc, CCC-SLP Speech Language Pathologist    Wende Bushy 12/23/2017, 2:21 PM

## 2017-12-23 NOTE — ED Notes (Signed)
Back from MRI.  Hospitalist in to see the Pt.

## 2017-12-23 NOTE — ED Provider Notes (Signed)
Patient presented with frontal sinus-like headache gradual onset with no neurologic symptoms.  Patient developed chest pain on arrival nonradiating.  Patient denies any coronary artery disease.  Patient has seen local cardiologist in the past.  Currently chest pain-free, no respiratory difficulty.  With chest pain EKG ordered and reviewed, very concerning T wave inversions anteriorly.  Patient does admit to cocaine abuse and last use yesterday.  Plan for aspirin, observation.  Mild troponin elevation likely due to cocaine however with age and history of stroke discussed the case with Dr. Domenic Polite cardiologist who agrees with continuing cardiac work-up and admission to the hospitalist.  CRITICAL CARE Performed by: Mariea Clonts  Total critical care time: 35 minutes  Critical care time was exclusive of separately billable procedures and treating other patients.  Critical care was necessary to treat or prevent imminent or life-threatening deterioration.  Critical care was time spent personally by me on the following activities: development of treatment plan with patient and/or surrogate as well as nursing, discussions with consultants, evaluation of patient's response to treatment, examination of patient, obtaining history from patient or surrogate, ordering and performing treatments and interventions, ordering and review of laboratory studies, ordering and review of radiographic studies, pulse oximetry and re-evaluation of patient's condition.  The patients results and plan were reviewed and discussed.   Any x-rays performed were independently reviewed by myself.   Differential diagnosis were considered with the presenting HPI.  Medications  sodium chloride 0.9 % bolus 1,000 mL (1,000 mLs Intravenous New Bag/Given 12/23/17 0756)  nitroGLYCERIN (NITROSTAT) SL tablet 0.4 mg (0.4 mg Sublingual Given 12/23/17 0715)  potassium chloride 10 mEq in 100 mL IVPB (has no administration in time range)   metoCLOPramide (REGLAN) injection 10 mg (10 mg Intravenous Given 12/23/17 0756)  diphenhydrAMINE (BENADRYL) injection 25 mg (25 mg Intravenous Given 12/23/17 0756)  aspirin chewable tablet 324 mg (324 mg Oral Given 12/23/17 0714)    Vitals:   12/23/17 0641 12/23/17 0643  BP:  (!) 189/97  Pulse:  79  Resp:  20  Temp:  98.6 F (37 C)  TempSrc:  Oral  SpO2:  96%  Weight: 66.2 kg   Height: 5' (1.524 m)     Final diagnoses:  Headache, unspecified headache type  Nonspecific chest pain  Cocaine abuse (HCC)  Abnormal EKG    Admission/ observation were discussed with the admitting physician, patient and/or family and they are comfortable with the plan.     [name], MD 12/26/17 [phone]

## 2017-12-23 NOTE — ED Notes (Signed)
CRITICAL VALUE ALERT  Critical Value:  Troponin - 0.08  Date & Time Notied:  12/23/17   0815  Provider Notified: Dr Reather Converse  Orders Received/Actions taken:

## 2017-12-23 NOTE — Progress Notes (Signed)
Patient in ultrasound having carotids done, unable to complete echo at this time.

## 2017-12-23 NOTE — ED Notes (Signed)
ED Provider at bedside. 

## 2017-12-23 NOTE — ED Notes (Signed)
Pt c/o chest pain that started a few minutes ago,

## 2017-12-23 NOTE — Progress Notes (Signed)
Patient is awake and alert. Patient given water to drink, no difficulty swallowing and no cough noted. Will continue to monitor.

## 2017-12-23 NOTE — Consult Note (Addendum)
Cardiology Consultation:   Patient ID: Cassandra Jordan MRN: [id number]; DOB: [birth year]  Admit date: 12/23/2017 Date of Consult: 12/23/2017  Primary Care Provider: [facility] Primary Cardiologist: Rozann Lesches, MD  Primary Electrophysiologist:  None    Patient Profile:   Cassandra Jordan is a 67 y.o. 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 Reather Converse.  History of Present Illness:   Cassandra Jordan is a 67 yo female patient of Dr. Domenic Polite with history of PSVT treated with amiodarone and carvedilol.  Also has moderate internal carotid artery stenosis but has not followed through with vascular evaluation.  Has essential hypertension.  2D echo 05/2017 normal LVEF 60 to 93%, diastolic dysfunction moderate septal hypertrophy and severely calcified aortic valve.  30-day monitor 05/2017 sinus rhythm.  Last saw Dr. Domenic Polite 12/11/2017 and doing well.  Patient comes to the emergency room with headache and upon arrival said she had chest pain-described as an ache across the top of her chest.  Eased spontaneously.  No radiation, dyspnea, palpitations, dizziness or presyncope.  She used cocaine last night at 7 PM.  She says she smokes cocaine twice a week and has been doing it for many years.  Smokes a pack of cigarettes in a week.  Father died of an MI suddenly in his 65s.  Mother died of cancer one sister alive.  She has hypertension and diabetes.  Low risk nuclear stress test 2016.  Past Medical History:  Diagnosis Date  . Alcohol abuse   . Arthritis   . Asthma   . Cocaine abuse ([place])   . Essential hypertension   . GERD (gastroesophageal reflux disease)   . Hepatitis C   . Hyperlipidemia   . Lichen sclerosus et atrophicus 10/13/2014  . Neuropathic pain   . Noncompliance   . PSVT (paroxysmal supraventricular tachycardia) ([place])   . Rash   . Renal carcinoma ([place])     . Sleep apnea   . Type 2 diabetes mellitus ([place])   . Vaginal irritation 10/01/2014  . Vaginal itching 10/01/2014  . Yeast infection of the vagina 10/01/2014    Past Surgical History:  Procedure Laterality Date  . ABDOMINAL HYSTERECTOMY    . COLONOSCOPY N/A 07/08/2013   RMR: Multiple colonic polyps treated [name] as described above. Inadequate prepartation comprimised examination there colonic diverticulosis. tubular and tubulovillous adenomas. next TCS 12/2013  . COLONOSCOPY N/A 02/09/2015   RMR: Colonic polyp removed as described above. Tubular adenoma. Next colonoscopy January 2022.  . ESOPHAGOGASTRODUODENOSCOPY N/A 07/08/2013   RMR: Schatzki's ring ; small hiatal hernia-status post passage of a Maloney dilator  . MALONEY DILATION N/A 07/08/2013   Procedure: [name] DILATION;  Surgeon: [name], MD;  Location: [facility];  Service: Endoscopy;  Laterality: N/A;  . ROBOT ASSISTED LAPAROSCOPIC NEPHRECTOMY Right 10/14/2013   Procedure: ROBOTIC ASSISTED LAPAROSCOPIC RADICAL NEPHRECTOMY;  Surgeon: [name], MD;  Location: [facility];  Service: Urology;  Laterality: Right;  . SAVORY DILATION N/A 07/08/2013   Procedure: SAVORY DILATION;  Surgeon: [name], MD;  Location: [facility];  Service: Endoscopy;  Laterality: N/A;  . TONSILLECTOMY    . Wart removal       Home Medications:  Prior to Admission medications   Medication Sig Start Date End Date Taking? Authorizing Provider  amiodarone (PACERONE) 100 MG tablet Take 1 tablet (100 mg  total) by mouth daily. TAKE ONE TABLET BY MOUTH ONCE DAILY 05/31/17   Satira Sark, MD  amLODipine (NORVASC) 10 MG tablet Take 1 tablet (10 mg total) by mouth daily. 11/04/16   Orson Eva, MD  aspirin EC 81 MG EC tablet Take 1 tablet (81 mg total) by mouth daily. 05/24/17   Isaac Bliss, Rayford Halsted, MD  atorvastatin (LIPITOR) 40 MG tablet Take 1 tablet (40 mg total) by mouth daily at 6 PM. 05/23/17   Isaac Bliss, Rayford Halsted, MD  carvedilol  (COREG) 6.25 MG tablet Take 6.25 mg by mouth 2 (two) times daily. 08/21/17   [provider]  cloNIDine (CATAPRES) 0.2 MG tablet Take 0.2 mg by mouth 2 (two) times daily. 08/21/17   [provider]  furosemide (LASIX) 20 MG tablet Take 20 mg by mouth daily.    [provider]  furosemide (LASIX) 20 MG tablet Take 1 tablet (20 mg total) by mouth daily. 10/29/17   Nat Christen, MD  gabapentin (NEURONTIN) 600 MG tablet Take 1 tablet by mouth 3 (three) times daily. 04/17/17   [provider]  insulin detemir (LEVEMIR) 100 UNIT/ML injection Inject 0.1 mLs (10 Units total) into the skin daily. 05/24/17   Isaac Bliss, Rayford Halsted, MD  LANTUS 100 UNIT/ML injection INJECT 9 UNITS UNDER THE SKIN ONCE DAILY 08/21/17   [provider]  lisinopril-hydrochlorothiazide (PRINZIDE,ZESTORETIC) 20-25 MG tablet Take 1 tablet by mouth 2 (two) times daily.    [provider]  pantoprazole (PROTONIX) 40 MG tablet Take 1 tablet (40 mg total) by mouth daily. Take 30 minutes before breakfast 03/06/16   Annitta Needs, NP  phenylephrine (SUDAFED PE) 10 MG TABS tablet Take 10 mg by mouth every 4 (four) hours as needed.    [provider]  potassium chloride (K-DUR) 10 MEQ tablet Take 1 tablet (10 mEq total) by mouth daily. 09/05/17   Noemi Chapel, MD    Inpatient Medications: Scheduled Meds:  Continuous Infusions: . potassium chloride    . sodium chloride 1,000 mL (12/23/17 0756)   PRN Meds: nitroGLYCERIN  Allergies:    Allergies  Allergen Reactions  . Sulfa Antibiotics Itching and Other (See Comments)    burning    Social History:   Social History   Socioeconomic History  . Marital status: Single    Spouse name: Not on file  . Number of children: Not on file  . Years of education: Not on file  . Highest education level: Not on file  Occupational History  . Not on file  Social Needs  . Financial resource strain: Not on file  . Food insecurity:     Worry: Not on file    Inability: Not on file  . Transportation needs:    Medical: Not on file    Non-medical: Not on file  Tobacco Use  . Smoking status: Current Some Day Smoker    Packs/day: 0.10    Years: 40.00    Pack years: 4.00    Types: Cigarettes  . Smokeless tobacco: Never Used  Substance and Sexual Activity  . Alcohol use: Not Currently    Alcohol/week: 0.0 standard drinks    Comment: rarely  . Drug use: Yes    Types: Marijuana, Cocaine    Comment: last used yesterday  . Sexual activity: Not Currently    Birth control/protection: Surgical    Comment: hyst  Lifestyle  . Physical activity:    Days per week: Not on file  Minutes per session: Not on file  . Stress: Not on file  Relationships  . Social connections:    Talks on phone: Not on file    Gets together: Not on file    Attends religious service: Not on file    Active member of club or organization: Not on file    Attends meetings of clubs or organizations: Not on file    Relationship status: Not on file  . Intimate partner violence:    Fear of current or ex partner: Not on file    Emotionally abused: Not on file    Physically abused: Not on file    Forced sexual activity: Not on file  Other Topics Concern  . Not on file  Social History Narrative  . Not on file    Family History:    Family History  Problem Relation Age of Onset  . Colon cancer Sister 75  . Cancer Mother   . Heart attack Father   . Hypertension Daughter   . Cancer Maternal Grandfather   . Diabetes Paternal Grandmother      ROS:  Please see the history of present illness.  Review of Systems  Constitution: Negative.  HENT: Negative.   Eyes: Negative.   Cardiovascular: Positive for chest pain.  Respiratory: Negative.   Hematologic/Lymphatic: Negative.   Musculoskeletal: Negative.  Negative for joint pain.  Gastrointestinal: Negative.   Genitourinary: Negative.   Neurological: Positive for headaches.    Psychiatric/Behavioral: Positive for substance abuse.    All other ROS reviewed and negative.     Physical Exam/Data:   Vitals:   12/23/17 0641 12/23/17 0643  BP:  (!) 189/97  Pulse:  79  Resp:  20  Temp:  98.6 F (37 C)  TempSrc:  Oral  SpO2:  96%  Weight: 66.2 kg   Height: 5' (1.524 m)    No intake or output data in the 24 hours ending 12/23/17 0852 Filed Weights   12/23/17 0641  Weight: 66.2 kg   Body mass index is 28.5 kg/m.  General:  Well nourished, well developed, in no acute distress  HEENT: Poor dentition with multiple caries and missing teeth Lymph: no adenopathy Neck: no JVD Endocrine:  No thryomegaly Vascular: Bilateral carotid bruits left greater than right; FA pulses 2+ bilaterally without bruits  Cardiac:  normal S1, S2; RRR; 2/6 systolic murmur at the left sternal border Lungs: Decreased breath sounds but clear to auscultation bilaterally, no wheezing, rhonchi or rales  Abd: soft, nontender, no hepatomegaly  Ext: no edema Musculoskeletal:  No deformities, BUE and BLE strength normal and equal Skin: warm and dry  Neuro:  CNs 2-12 intact, no focal abnormalities noted Psych:  Normal affect   EKG:  The EKG was personally reviewed and demonstrates: Normal sinus rhythm with deep T wave inversion new from prior EKG Telemetry:  Telemetry was personally reviewed and demonstrates: Normal sinus rhythm  Relevant CV Studies:  Echocardiogram 05/23/2017: Study Conclusions   - Left ventricle: The cavity size was normal. Systolic function was   normal. The estimated ejection fraction was in the range of 60%   to 65%. Wall motion was normal; there were no regional wall   motion abnormalities. Findings consistent with left ventricular   diastolic dysfunction, grade indeterminate. Doppler parameters   are consistent with high ventricular filling pressure. Mild   concentric and moderate focal basal septal hypertrophy. - Aortic valve: Severely calcified annulus.  Trileaflet. - Mitral valve: There was mild regurgitation. - Atrial  septum: No defect or patent foramen ovale was identified.   30-day event monitor 05/30/2017: Representative strips from 30-day event monitor reviewed.  Sinus rhythm was present, there were only 2 reported events.  Heart rate ranged from 58 bpm up to 112 bpm with average heart rate 76 bpm.  No obvious arrhythmias or pauses.  Carotid Dopplers 5/2019IMPRESSION: Greater than 70% stenosis in the right internal carotid artery.   Less than 50% stenosis in the left internal carotid artery.     Electronically Signed   By: Marybelle Killings M.D.   On: 05/22/2017 09:04   Laboratory Data:  Chemistry Recent Labs  Lab 12/23/17 0755 12/23/17 0757  NA 140 140  K 2.8* 3.0*  CL 99 99  CO2 31  --   GLUCOSE 128* 126*  BUN 22 23  CREATININE 1.48* 1.40*  CALCIUM 8.8*  --   GFRNONAA 37*  --   GFRAA 42*  --   ANIONGAP 10  --     No results for input(s): PROT, ALBUMIN, AST, ALT, ALKPHOS, BILITOT in the last 168 hours. Hematology Recent Labs  Lab 12/23/17 0755 12/23/17 0757  WBC 11.2*  --   RBC 4.64  --   HGB 12.7 13.9  HCT 41.3 41.0  MCV 89.0  --   MCH 27.4  --   MCHC 30.8  --   RDW 12.8  --   PLT 375  --    Cardiac EnzymesNo results for input(s): TROPONINI in the last 168 hours.  Recent Labs  Lab 12/23/17 0755  TROPIPOC 0.08    BNPNo results for input(s): BNP, PROBNP in the last 168 hours.  DDimer No results for input(s): DDIMER in the last 168 hours.  Radiology/Studies:  Dg Chest 2 View  Result Date: 12/23/2017 CLINICAL DATA:  Chest pain this morning. EXAM: CHEST - 2 VIEW COMPARISON:  10/29/2017 FINDINGS: Lungs are adequately inflated without consolidation or effusion. Possible minimal vascular congestion. Cardiomediastinal silhouette and remainder of the exam is unchanged. IMPRESSION: Possible minimal vascular congestion. Electronically Signed   By: Marin Olp M.D.   On: 12/23/2017 08:31    Assessment and  Plan:   1. 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  Regular cocaine use last used 7 PM last night as well as 70% R ICA on Dopplers 05/2017 did not follow-up with vascular consult. 2. Abnormal EKG with deep T wave inversion could be secondary to CNS event.  She did have some chest pain on arrival that was somewhat atypical and has resolved spontaneously.  No cardiac work-up at this time with acute CVA. 3. Hypokalemia 4. PSVT on amiodarone and carvedilol 5. Essential hypertension on clonidine 6. Hyperlipidemia on Lipitor 7. Diabetes mellitus type 2 8. Polysubstance abuse uses cocaine at least twice weekly for many years and says she cannot stop. 9. Carotid stenosis   For questions or updates, please contact [place] Please consult [email or web address] for contact info under    Signed, Ermalinda Barrios, PA-C  12/23/2017 8:52 AM   Attending note:  Patient seen and examined.  Discussed case with Ms. Bonnell Public PA-C.  Cassandra Jordan is a patient of mine, recently seen in the office in late November for follow-up of PSVT.  She has been on a combination of Coreg and amiodarone due to prior frequent symptoms, overall providing good control.  LFTs and TSH have been normal.   Unfortunately, she also has a history of recurrent cocaine abuse although at her recent  visit stated that she was not using.  She has apparently been using regularly, including last night, came to the ER complaining of frontal headache and sinus fullness, had a brief episode of chest pain after being checked in but this resolved spontaneously.  Pressure elevated with systolics in the 503U to 882C but heart rate normal in the 70s in sinus rhythm.  ECG was obtained with significant anterolateral T wave inversions which are new.  On examination she is in no distress, complains of frontal headache, no visual changes.  No active chest pain on my evaluation.  Lungs exhibit decreased  breath sounds but no wheezing, cardiac exam with RRR and soft systolic murmur but no gallop.  Lab work shows potassium 3.0, BUN 23, creatinine 1.4, troponin I 0.08, hemoglobin 13.9, platelets 375.  Chest x-ray reports minimal vascular congestion.  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  Brain MRI is pending.  Of note, she does have bilateral internal carotid artery disease, greater than 70% RICA, but has not followed through on vascular evaluation.  Patient is undergoing further work-up with brain MRI per ER evaluation, anticipated admission to the hospitalist service.  Her ECG is clearly abnormal with new, deep anterolateral T wave inversions.  With recent cocaine use, cardiac ischemia or cardiomyopathy is certainly to be considered, although significant T wave inversions can also be associated with a significant CNS events.  From a cardiac perspective would recommend a transthoracic echocardiogram, cycle cardiac markers.  Would not start heparin given concerns about acute stroke.  Would not anticipate pursuing invasive cardiac evaluation at this time.  She will need neurology consultation per primary team.  Satira Sark, M.D., F.A.C.C.

## 2017-12-23 NOTE — ED Notes (Signed)
Paged [facility] for EDP.

## 2017-12-24 ENCOUNTER — Inpatient Hospital Stay (HOSPITAL_COMMUNITY): Payer: Medicare Other

## 2017-12-24 DIAGNOSIS — I340 Nonrheumatic mitral (valve) insufficiency: Secondary | ICD-10-CM

## 2017-12-24 DIAGNOSIS — N183 Chronic kidney disease, stage 3 (moderate): Secondary | ICD-10-CM

## 2017-12-24 DIAGNOSIS — I10 Essential (primary) hypertension: Secondary | ICD-10-CM

## 2017-12-24 DIAGNOSIS — E876 Hypokalemia: Secondary | ICD-10-CM

## 2017-12-24 DIAGNOSIS — I639 Cerebral infarction, unspecified: Secondary | ICD-10-CM

## 2017-12-24 DIAGNOSIS — I471 Supraventricular tachycardia: Secondary | ICD-10-CM

## 2017-12-24 DIAGNOSIS — R7989 Other specified abnormal findings of blood chemistry: Secondary | ICD-10-CM

## 2017-12-24 DIAGNOSIS — R531 Weakness: Secondary | ICD-10-CM

## 2017-12-24 LAB — GLUCOSE, CAPILLARY
GLUCOSE-CAPILLARY: 69 mg/dL — AB (ref 70–99)
Glucose-Capillary: 119 mg/dL — ABNORMAL HIGH (ref 70–99)
Glucose-Capillary: 140 mg/dL — ABNORMAL HIGH (ref 70–99)
Glucose-Capillary: 110 mg/dL — ABNORMAL HIGH (ref 70–99)

## 2017-12-24 LAB — COMPREHENSIVE METABOLIC PANEL
ALT: 11 U/L (ref 0–44)
AST: 24 U/L (ref 15–41)
Albumin: 2.4 g/dL — ABNORMAL LOW (ref 3.5–5.0)
Alkaline Phosphatase: 41 U/L (ref 38–126)
Anion gap: 7 (ref 5–15)
BUN: 23 mg/dL (ref 8–23)
CO2: 22 mmol/L (ref 22–32)
Calcium: 8 mg/dL — ABNORMAL LOW (ref 8.9–10.3)
Chloride: 110 mmol/L (ref 98–111)
Creatinine, Ser: 1.62 mg/dL — ABNORMAL HIGH (ref 0.44–1.00)
GFR calc Af Amer: 38 mL/min — ABNORMAL LOW (ref >60)
GFR calc non Af Amer: 33 mL/min — ABNORMAL LOW (ref >60)
Glucose, Bld: 127 mg/dL — ABNORMAL HIGH (ref 70–99)
POTASSIUM: 4.6 mmol/L (ref 3.5–5.1)
Sodium: 139 mmol/L (ref 135–145)
Total Bilirubin: 0.5 mg/dL (ref 0.3–1.2)
Total Protein: 6.6 g/dL (ref 6.5–8.1)

## 2017-12-24 LAB — ECHOCARDIOGRAM COMPLETE
Height: 60 in
Weight: 2338.64 oz

## 2017-12-24 LAB — LIPID PANEL
Cholesterol: 280 mg/dL — ABNORMAL HIGH (ref 0–200)
HDL: 52 mg/dL (ref >40)
LDL Cholesterol: 195 mg/dL — ABNORMAL HIGH (ref 0–99)
Total CHOL/HDL Ratio: 5.4 RATIO
Triglycerides: 163 mg/dL — ABNORMAL HIGH (ref <150)
VLDL: 33 mg/dL (ref 0–40)

## 2017-12-24 LAB — MAGNESIUM: Magnesium: 1.8 mg/dL (ref 1.7–2.4)

## 2017-12-24 LAB — MRSA PCR SCREENING: MRSA by PCR: NEGATIVE

## 2017-12-24 LAB — HIV ANTIBODY (ROUTINE TESTING W REFLEX): HIV Screen 4th Generation wRfx: NONREACTIVE

## 2017-12-24 MED ORDER — CLOPIDOGREL BISULFATE 75 MG PO TABS
75.0000 mg | ORAL_TABLET | Freq: Every day | ORAL | Status: DC
  Administered 2017-12-25 – 2018-01-10 (×9): 75 mg via ORAL
  Filled 2017-12-24 (×10): qty 1

## 2017-12-24 MED ORDER — ORAL CARE MOUTH RINSE
15.0000 mL | Freq: Two times a day (BID) | OROMUCOSAL | Status: DC
  Administered 2017-12-24 – 2018-01-10 (×26): 15 mL via OROMUCOSAL

## 2017-12-24 MED ORDER — ATORVASTATIN CALCIUM 40 MG PO TABS
80.0000 mg | ORAL_TABLET | Freq: Every day | ORAL | Status: DC
  Administered 2017-12-24 – 2018-01-08 (×6): 80 mg via ORAL
  Filled 2017-12-24 (×8): qty 2

## 2017-12-24 MED ORDER — SODIUM CHLORIDE 0.9 % IV SOLN
INTRAVENOUS | Status: AC
  Administered 2017-12-24 – 2017-12-25 (×2): via INTRAVENOUS

## 2017-12-24 MED ORDER — ENOXAPARIN SODIUM 30 MG/0.3ML ~~LOC~~ SOLN
30.0000 mg | SUBCUTANEOUS | Status: DC
  Administered 2017-12-24: 30 mg via SUBCUTANEOUS
  Filled 2017-12-24: qty 0.3

## 2017-12-24 MED ORDER — HALOPERIDOL LACTATE 5 MG/ML IJ SOLN
2.5000 mg | Freq: Once | INTRAMUSCULAR | Status: AC
  Administered 2017-12-24: 2.5 mg via INTRAVENOUS
  Filled 2017-12-24: qty 1

## 2017-12-24 MED ORDER — CLOTRIMAZOLE 1 % VA CREA
1.0000 | TOPICAL_CREAM | Freq: Every day | VAGINAL | Status: AC
  Administered 2017-12-24 – 2017-12-30 (×6): 1 via VAGINAL
  Filled 2017-12-24: qty 45

## 2017-12-24 MED ORDER — AMIODARONE HCL 200 MG PO TABS
100.0000 mg | ORAL_TABLET | Freq: Every day | ORAL | Status: DC
  Administered 2017-12-24 – 2018-01-10 (×9): 100 mg via ORAL
  Filled 2017-12-24 (×11): qty 1

## 2017-12-24 NOTE — Progress Notes (Signed)
Progress Note  Patient Name: Cassandra Jordan Date of Encounter: 12/24/2017  Primary Cardiologist: Dr. Satira Sark  Subjective   Denies headache or chest pain.  No shortness of breath at rest.  No palpitations.  Answers questions appropriately.  No obvious swallowing difficulties per nursing.   Inpatient Medications    Scheduled Meds: . aspirin  325 mg Oral Daily   Or  . aspirin  300 mg Rectal Daily  . atorvastatin  40 mg Oral q1800  . cloNIDine  0.1 mg Oral BID  . clotrimazole  1 Applicatorful Vaginal QHS  . enoxaparin (LOVENOX) injection  30 mg Subcutaneous Q24H  . folic acid  1 mg Oral Daily  . Influenza vac split quadrivalent PF  0.5 mL Intramuscular Tomorrow-1000  . insulin aspart  0-5 Units Subcutaneous QHS  . insulin aspart  0-9 Units Subcutaneous TID WC  . insulin aspart  3 Units Subcutaneous TID WC  . mouth rinse  15 mL Mouth Rinse BID  . multivitamin with minerals  1 tablet Oral Daily  . pneumococcal 23 valent vaccine  0.5 mL Intramuscular Tomorrow-1000  . thiamine  100 mg Oral Daily   Or  . thiamine  100 mg Intravenous Daily   Continuous Infusions: . sodium chloride 50 mL/hr at 12/24/17 0820   PRN Meds: acetaminophen **OR** acetaminophen (TYLENOL) oral liquid 160 mg/5 mL **OR** acetaminophen, hydrALAZINE, LORazepam **OR** LORazepam, nitroGLYCERIN, senna-docusate   Vital Signs    Vitals:   12/24/17 0455 12/24/17 0600 12/24/17 0720 12/24/17 0800  BP:  (!) 180/106  (!) 169/93  Pulse:  79 79 81  Resp:  20 (!) 25 (!) 27  Temp:   98.3 F (36.8 C)   TempSrc:   Oral   SpO2:  92% 100% 98%  Weight: 66.3 kg     Height:        Intake/Output Summary (Last 24 hours) at 12/24/2017 0824 Last data filed at 12/24/2017 0820 Gross per 24 hour  Intake 2363.17 ml  Output -  Net 2363.17 ml   Filed Weights   12/23/17 0641 12/23/17 1656 12/24/17 0455  Weight: 66.2 kg 65 kg 66.3 kg    Telemetry    Sinus rhythm.  Personally reviewed.  Physical Exam    GEN: No acute distress.   Neck: No JVD. Cardiac: RRR, soft systolic murmur, no gallop. Respiratory: Nonlabored. Clear to auscultation bilaterally. GI: Soft, nontender, bowel sounds present. MS: No edema; No deformity. Neuro:   Left arm seems weak relative to the right.  No dysarthria. Psych: Alert and oriented x 3.  Flat affect.  Labs    Chemistry Recent Labs  Lab 12/23/17 0755 12/23/17 0757 12/24/17 0401  NA 140 140 139  K 2.8* 3.0* 4.6  CL 99 99 110  CO2 31  --  22  GLUCOSE 128* 126* 127*  BUN 22 23 23   CREATININE 1.48* 1.40* 1.62*  CALCIUM 8.8*  --  8.0*  PROT  --   --  6.6  ALBUMIN  --   --  2.4*  AST  --   --  24  ALT  --   --  11  ALKPHOS  --   --  41  BILITOT  --   --  0.5  GFRNONAA 37*  --  33*  GFRAA 42*  --  38*  ANIONGAP 10  --  7     Hematology Recent Labs  Lab 12/23/17 0755 12/23/17 0757  WBC 11.2*  --   RBC  4.64  --   HGB 12.7 13.9  HCT 41.3 41.0  MCV 89.0  --   MCH 27.4  --   MCHC 30.8  --   RDW 12.8  --   PLT 375  --     Cardiac Enzymes Recent Labs  Lab 12/23/17 1211  TROPONINI 0.07*    Recent Labs  Lab 12/23/17 0755  TROPIPOC 0.08     Radiology    Dg Chest 2 View  Result Date: 12/23/2017 CLINICAL DATA:  Chest pain this morning. EXAM: CHEST - 2 VIEW COMPARISON:  10/29/2017 FINDINGS: Lungs are adequately inflated without consolidation or effusion. Possible minimal vascular congestion. Cardiomediastinal silhouette and remainder of the exam is unchanged. IMPRESSION: Possible minimal vascular congestion. Electronically Signed   By: Marin Olp M.D.   On: 12/23/2017 08:31   Ct Head Wo Contrast  Result Date: 12/23/2017 CLINICAL DATA:  67 year old female with facial pain across forehead and nasal region. Chest pain. Rectal cancer. Diabetes and hypertension. Paroxysmal ventricular tachycardia. Initial encounter. EXAM: CT HEAD WITHOUT CONTRAST TECHNIQUE: Contiguous axial images were obtained from the base of the skull through the  vertex without intravenous contrast. COMPARISON:  05/22/2017 brain MR. 05/21/2017 head CT. FINDINGS: Brain: Moderate size acute/subacute nonhemorrhagic left mid to inferior cerebellar infarct suspected. Three new rounded hypodensities within the right corona radiata. Findings may reflect result of interval infarcts given patient's history of multiple prior infarcts however, with history of rectal cancer, intracranial metastatic disease cannot be excluded. Moderate-to-marked chronic microvascular changes. Global atrophy. No intracranial hemorrhage. Vascular: Vascular calcifications.  No acute hyperdense vessel. Skull: No acute abnormality. Sinuses/Orbits: Visualized orbital structures reveal prior lens replacement without acute abnormality detected. Minimal mucosal thickening right sphenoid sinuses and ethmoid sinus air cells. Other: Visualized mastoid air cells and middle ear cavities are clear. IMPRESSION: 1. Moderate size acute/subacute nonhemorrhagic left mid to inferior cerebellar infarct suspected. 2. Three new rounded hypodensities within the right corona radiata. Findings may reflect result of interval infarcts given patient's history of multiple prior infarcts however, with history of rectal cancer, intracranial metastatic disease cannot be excluded. Contrast-enhanced MR would prove helpful for further delineation if clinically desired. 3. Moderate-to-marked chronic microvascular changes. Global atrophy. These results were called by telephone at the time of interpretation on 12/23/2017 at 8:57 am to Dr. [name] , who verbally acknowledged these results. Electronically Signed   By: Genia Del M.D.   On: 12/23/2017 08:58   Mr Jodene Nam Head Wo Contrast  Result Date: 12/23/2017 CLINICAL DATA:  facial pain. Diabetes and hypertension. Abnormal head CT EXAM: MRI HEAD WITHOUT AND WITH CONTRAST MRA HEAD WITHOUT CONTRAST TECHNIQUE: Multiplanar, multiecho pulse sequences of the brain and surrounding structures  were obtained without and with intravenous contrast. Angiographic images of the head were obtained using MRA technique without contrast. CONTRAST:  6 cc Gadavist COMPARISON:  CT same day.  MRI 05/22/2017 FINDINGS: MRI HEAD FINDINGS Brain: Diffusion imaging shows a 4 cm region of acute infarction at the inferior cerebellum on the left consistent with left posterior inferior cerebellar artery branch vessel occlusion. Mild swelling but no hemorrhage or mass effect. Additionally, there are multiple clustered foci of acute infarction within the deep white matter on the right within the radiating white matter tracts most consistent with hypoperfusion or watershed infarctions. Likewise, there is mild swelling but no hemorrhage or mass effect. There are no acute cortically based infarctions affecting the supratentorial brain. Background pattern of chronic small vessel ischemic change affects the pons, basal ganglia and  the cerebral hemispheric white matter. No hydrocephalus. No extra-axial collection. No sign of mass lesion. After contrast administration, no abnormal enhancement occurs. Vascular: Major vessels at the base of the brain show flow. See below. Skull and upper cervical spine: Negative Sinuses/Orbits: Ordinary mucosal inflammatory changes of the maxillary sinuses. Orbits negative. Other: None MRA HEAD FINDINGS Both internal carotid arteries are patent through the skull base. On the left, the anterior and middle cerebral vessels show flow, similar to the previous study. On the right, there is a new severe stenosis/subtotal occlusion of the supraclinoid ICA/proximal MCA. Left vertebral artery remains patent to the basilar. Flow is visible within the left posterior inferior cerebellar artery. There is motion degradation on today's exam which precludes more distal evaluation. The right vertebral artery is a disease vessel which does not show flow to the basilar on today's study. This could be in part technical.  Atherosclerotic irregularity of the basilar artery. Distal posterior circulation branch vessels are poorly seen. IMPRESSION: Acute infarction within the left posterior inferior cerebellar artery territory. Mild swelling but no hemorrhage or mass effect. Flow persists within the left vertebral artery and at least to some extent within the left posterior inferior cerebellar artery. Multiple acute infarctions within the deep white matter/radiating white matter tracts of the right cerebral hemisphere, most consistent with watershed or hypoperfusion infarctions. Mild swelling but no hemorrhage or mass effect. 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 The intracranial MR angiogram is motion degraded compared to previous studies. Both anterior cerebral arteries receive there supply from the left carotid circulation. No change seen on the left in the anterior circulation. The right supraclinoid ICA/proximal MCA stenosis is new. These results were called by telephone at the time of interpretation on 12/23/2017 at 10:15 am to Dr. [name] , who verbally acknowledged these results. Electronically Signed   By: Nelson Chimes M.D.   On: 12/23/2017 10:15   Mr Brain W And Wo Contrast  Result Date: 12/23/2017 CLINICAL DATA:  facial pain. Diabetes and hypertension. Abnormal head CT EXAM: MRI HEAD WITHOUT AND WITH CONTRAST MRA HEAD WITHOUT CONTRAST TECHNIQUE: Multiplanar, multiecho pulse sequences of the brain and surrounding structures were obtained without and with intravenous contrast. Angiographic images of the head were obtained using MRA technique without contrast. CONTRAST:  6 cc Gadavist COMPARISON:  CT same day.  MRI 05/22/2017 FINDINGS: MRI HEAD FINDINGS Brain: Diffusion imaging shows a 4 cm region of acute infarction at the inferior cerebellum on the left consistent with left posterior inferior cerebellar artery branch vessel occlusion. Mild swelling but no  hemorrhage or mass effect. Additionally, there are multiple clustered foci of acute infarction within the deep white matter on the right within the radiating white matter tracts most consistent with hypoperfusion or watershed infarctions. Likewise, there is mild swelling but no hemorrhage or mass effect. There are no acute cortically based infarctions affecting the supratentorial brain. Background pattern of chronic small vessel ischemic change affects the pons, basal ganglia and the cerebral hemispheric white matter. No hydrocephalus. No extra-axial collection. No sign of mass lesion. After contrast administration, no abnormal enhancement occurs. Vascular: Major vessels at the base of the brain show flow. See below. Skull and upper cervical spine: Negative Sinuses/Orbits: Ordinary mucosal inflammatory changes of the maxillary sinuses. Orbits negative. Other: None MRA HEAD FINDINGS Both internal carotid arteries are patent through the skull base. On the left, the anterior and middle cerebral vessels show flow, similar to the previous study.  On the right, there is a new severe stenosis/subtotal occlusion of the supraclinoid ICA/proximal MCA. Left vertebral artery remains patent to the basilar. Flow is visible within the left posterior inferior cerebellar artery. There is motion degradation on today's exam which precludes more distal evaluation. The right vertebral artery is a disease vessel which does not show flow to the basilar on today's study. This could be in part technical. Atherosclerotic irregularity of the basilar artery. Distal posterior circulation branch vessels are poorly seen. IMPRESSION: Acute infarction within the left posterior inferior cerebellar artery territory. Mild swelling but no hemorrhage or mass effect. Flow persists within the left vertebral artery and at least to some extent within the left posterior inferior cerebellar artery. Multiple acute infarctions within the deep white  matter/radiating white matter tracts of the right cerebral hemisphere, most consistent with watershed or hypoperfusion infarctions. Mild swelling but no hemorrhage or mass effect. 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 The intracranial MR angiogram is motion degraded compared to previous studies. Both anterior cerebral arteries receive there supply from the left carotid circulation. No change seen on the left in the anterior circulation. The right supraclinoid ICA/proximal MCA stenosis is new. These results were called by telephone at the time of interpretation on 12/23/2017 at 10:15 am to Dr. [name] , who verbally acknowledged these results. Electronically Signed   By: Nelson Chimes M.D.   On: 12/23/2017 10:15   US Carotid Bilateral (at [facility] And [facility] Only)  Result Date: 12/23/2017 CLINICAL DATA:  Stroke.  History of heart murmur. EXAM: BILATERAL CAROTID DUPLEX ULTRASOUND TECHNIQUE: Pearline Cables scale imaging, color Doppler and duplex ultrasound were performed of bilateral carotid and vertebral arteries in the neck. COMPARISON:  None. FINDINGS: Criteria: Quantification of carotid stenosis is based on velocity parameters that correlate the residual internal carotid diameter with NASCET-based stenosis levels, using the diameter of the distal internal carotid lumen as the denominator for stenosis measurement. The following velocity measurements were obtained: RIGHT ICA:  203/66 cm/sec CCA:  40/34 cm/sec SYSTOLIC ICA/CCA RATIO:  3.8 ECA:  200 cm/sec LEFT ICA:  94/30 cm/sec CCA:  74/25 cm/sec SYSTOLIC ICA/CCA RATIO:  1.2 ECA:  65 cm/sec RIGHT CAROTID ARTERY: There is a large amount of eccentric echogenic plaque within the right carotid bulb (image 24 and 27), which results in elevated peak systolic velocities within the proximal aspect of the right internal carotid artery. Greatest acquired peak systolic velocity within the proximal right ICA measures 203  centimeters/second (image 47). RIGHT VERTEBRAL ARTERY:  Antegrade Flow LEFT CAROTID ARTERY: There is a moderate amount of eccentric mixed echogenic plaque within the left carotid bulb (image 71 and 74), not resulting in elevated peak systolic velocities within the interrogated course of the left internal carotid artery to suggest a hemodynamically significant stenosis. LEFT VERTEBRAL ARTERY:  Antegrade Flow IMPRESSION: 1. Large amount of right-sided atherosclerotic plaque results in elevated peak systolic velocities within the right internal carotid artery compatible with the higher end of the 50-69% luminal narrowing range. Further evaluation with CTA could performed as clinically indicated. 2. Moderate amount of left-sided atherosclerotic plaque, not resulting in a hemodynamically significant stenosis. Electronically Signed   By: Sandi Mariscal M.D.   On: 12/23/2017 15:06    Cardiac Studies   Echocardiogram pending.  Patient Profile     67 y.o. female with a history of PSVT, hypertension, type 2 diabetes mellitus, hepatitis C, hyperlipidemia, carotid artery disease and recurring cocaine abuse, now presenting  with abnormal ECG and troponin I levels in the setting of recent cocaine use, also newly documented stroke.  Assessment & Plan    1.  Abnormal ECG with deep anterolateral T wave inversions, troponin I levels minimally increased at 0.08 and 0.07.  Ischemic changes and/or cardiomyopathy are certainly possible with recent recurring cocaine abuse, she did have an episode of chest pain when she first presented. Picture is complicated however by acute CNS event as well which can also lead to T wave abnormalities on ECG.  2.  History of PSVT, quiescent at this time.  3.  Recurrent cocaine abuse.  4.  Essential hypertension.  5.  Mixed hyperlipidemia, recent LDL 195.  Uncertain about compliance with Lipitor as an outpatient.  6. CKD stage 3.  Creatinine ranging 1.3 to 1.9 over the last 6 months  based on chart review.  7.  Carotid artery disease, 50 to 69% RICA stenosis and nonobstructive LICA disease.  Patient referred to Dr. Donzetta Matters with [facility] previously, did not present for evaluation.   8.  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  Patient currently on aspirin at 325 mg daily as well as Lipitor 40 mg daily and clonidine.  Beta-blocker discontinued in the setting of recent cocaine abuse.  Would resume amiodarone at previous outpatient dose.  Increase Lipitor to 80 mg daily.  Follow-up echocardiogram is pending to assess LVEF and potential wall motion abnormalities.  I do not anticipate invasive cardiac testing at this time.  Patient needs a neurology consultation, if this cannot be accomplished at [facility] would suggest transferring her to the hospitalist service at [facility].  She could also get input from vascular surgery in that case in light of RICA stenosis and her acute infarct in distribution of right MCA.  This is a difficult management situation however with her recurring cocaine abuse, and it may be that the situation is handled conservatively.  Signed, Rozann Lesches, MD  12/24/2017, 8:24 AM

## 2017-12-24 NOTE — Progress Notes (Addendum)
PROGRESS NOTE    Cassandra Jordan  NLZ:[id number]  DOB: 11-17-1950  DOA: 12/23/2017 PCP: [facility]   Brief Admission Hx: 67 y.o. female chronic cocaine abuser, smoker, diabetic, with known cerebrovascular and heart disease presented to ED with complaints of a frontal headache and acute CVA.    MDM/Assessment & Plan:   1. Acute CVA - MRI reveals multiple acute infarcts in deep white matter and along the left PICA territory.  Pt is on CVA treatment protocol.  Aspirin for antiplatelet therapy.  CVA workup ongoing. Neurology will see her today.  I confirmed with Dr. Merlene Laughter.  2. Right ICA stenosis - likely will need vascular consult at some point.  Will await neurology recs.  3. Dyslipidemia - LDL of 181 - continue atorvastatin 80 mg daily. 4. Prolonged QTc - monitoring on telemetry.  5. Essential hypertension - allowing some permissive hypertension in the setting of acute CVA.  Plan to gradually lower BP over next several days.   6. PSVT - cardiology has restarted patient on her home amiodarone.  7. Type 2 DM - continue carb modified diet, follow CBGs. A1c is 6.7% which is at ADA goal.  8. Cocaine abuse - Social worker consulted to discuss treatment options with patient.    DVT prophylaxis: lovenox Code Status: Full  Family Communication: none  Disposition Plan: inpatient management  Consultants:  Neurology  cardiology  Antimicrobials:    Subjective: Pt without complaints.  Pt denies chest pain and SOB this morning.  She has been eating and drinking without difficulty.    Objective: Vitals:   12/24/17 0720 12/24/17 0800 12/24/17 0900 12/24/17 1000  BP:  (!) 169/93  (!) 182/104  Pulse: 79 81 77 83  Resp: (!) 25 (!) 27 (!) 24 (!) 36  Temp: 98.3 F (36.8 C)     TempSrc: Oral     SpO2: 100% 98% 99% 98%  Weight:      Height:        Intake/Output Summary (Last 24 hours) at 12/24/2017 1055 Last data filed at 12/24/2017 1000 Gross per 24 hour    Intake 1446.44 ml  Output -  Net 1446.44 ml   Filed Weights   12/23/17 0641 12/23/17 1656 12/24/17 0455  Weight: 66.2 kg 65 kg 66.3 kg   REVIEW OF SYSTEMS  As per history otherwise all reviewed and reported negative  Exam:  General exam: awake, alert, NAD, cooperative.   Respiratory system:  No increased work of breathing. Cardiovascular system: S1 & S2 heard. No JVD, murmurs, gallops, clicks or pedal edema. Gastrointestinal system: Abdomen is nondistended, soft and nontender. Normal bowel sounds heard. Central nervous system: Alert and oriented. Moving all extremities.  Extremities: no CCE.  Data Reviewed: Basic Metabolic Panel: Recent Labs  Lab 12/23/17 0755 12/23/17 0757 12/24/17 0401  NA 140 140 139  K 2.8* 3.0* 4.6  CL 99 99 110  CO2 31  --  22  GLUCOSE 128* 126* 127*  BUN 22 23 23   CREATININE 1.48* 1.40* 1.62*  CALCIUM 8.8*  --  8.0*  MG 1.9  --  1.8   Liver Function Tests: Recent Labs  Lab 12/24/17 0401  AST 24  ALT 11  ALKPHOS 41  BILITOT 0.5  PROT 6.6  ALBUMIN 2.4*   No results for input(s): LIPASE, AMYLASE in the last 168 hours. No results for input(s): AMMONIA in the last 168 hours. CBC: Recent Labs  Lab 12/23/17 0755 12/23/17 0757  WBC 11.2*  --  NEUTROABS 6.8  --   HGB 12.7 13.9  HCT 41.3 41.0  MCV 89.0  --   PLT 375  --    Cardiac Enzymes: Recent Labs  Lab 12/23/17 1211  TROPONINI 0.07*   CBG (last 3)  Recent Labs    12/23/17 1643 12/23/17 2052 12/24/17 0722  GLUCAP 141* 110* 119*   Recent Results (from the past 240 hour(s))  MRSA PCR Screening     Status: None   Collection Time: 12/23/17 12:20 PM  Result Value Ref Range Status   MRSA by PCR NEGATIVE NEGATIVE Final    Comment:        The GeneXpert MRSA Assay (FDA approved for NASAL specimens only), is one component of a comprehensive MRSA colonization surveillance program. It is not intended to diagnose MRSA infection nor to guide or monitor treatment for MRSA  infections. Performed at [facility], [address] [place], [place] [address]      Studies: Dg Chest 2 View  Result Date: 12/23/2017 CLINICAL DATA:  Chest pain this morning. EXAM: CHEST - 2 VIEW COMPARISON:  10/29/2017 FINDINGS: Lungs are adequately inflated without consolidation or effusion. Possible minimal vascular congestion. Cardiomediastinal silhouette and remainder of the exam is unchanged. IMPRESSION: Possible minimal vascular congestion. Electronically Signed   By: Marin Olp M.D.   On: 12/23/2017 08:31   Ct Head Wo Contrast  Result Date: 12/23/2017 CLINICAL DATA:  67 year old female with facial pain across forehead and nasal region. Chest pain. Rectal cancer. Diabetes and hypertension. Paroxysmal ventricular tachycardia. Initial encounter. EXAM: CT HEAD WITHOUT CONTRAST TECHNIQUE: Contiguous axial images were obtained from the base of the skull through the vertex without intravenous contrast. COMPARISON:  05/22/2017 brain MR. 05/21/2017 head CT. FINDINGS: Brain: Moderate size acute/subacute nonhemorrhagic left mid to inferior cerebellar infarct suspected. Three new rounded hypodensities within the right corona radiata. Findings may reflect result of interval infarcts given patient's history of multiple prior infarcts however, with history of rectal cancer, intracranial metastatic disease cannot be excluded. Moderate-to-marked chronic microvascular changes. Global atrophy. No intracranial hemorrhage. Vascular: Vascular calcifications.  No acute hyperdense vessel. Skull: No acute abnormality. Sinuses/Orbits: Visualized orbital structures reveal prior lens replacement without acute abnormality detected. Minimal mucosal thickening right sphenoid sinuses and ethmoid sinus air cells. Other: Visualized mastoid air cells and middle ear cavities are clear. IMPRESSION: 1. Moderate size acute/subacute nonhemorrhagic left mid to inferior cerebellar infarct suspected. 2. Three new rounded  hypodensities within the right corona radiata. Findings may reflect result of interval infarcts given patient's history of multiple prior infarcts however, with history of rectal cancer, intracranial metastatic disease cannot be excluded. Contrast-enhanced MR would prove helpful for further delineation if clinically desired. 3. Moderate-to-marked chronic microvascular changes. Global atrophy. These results were called by telephone at the time of interpretation on 12/23/2017 at 8:57 am to Dr. [name] , who verbally acknowledged these results. Electronically Signed   By: Genia Del M.D.   On: 12/23/2017 08:58   Mr Jodene Nam Head Wo Contrast  Result Date: 12/23/2017 CLINICAL DATA:  facial pain. Diabetes and hypertension. Abnormal head CT EXAM: MRI HEAD WITHOUT AND WITH CONTRAST MRA HEAD WITHOUT CONTRAST TECHNIQUE: Multiplanar, multiecho pulse sequences of the brain and surrounding structures were obtained without and with intravenous contrast. Angiographic images of the head were obtained using MRA technique without contrast. CONTRAST:  6 cc Gadavist COMPARISON:  CT same day.  MRI 05/22/2017 FINDINGS: MRI HEAD FINDINGS Brain: Diffusion imaging shows a 4 cm region of acute infarction at the  inferior cerebellum on the left consistent with left posterior inferior cerebellar artery branch vessel occlusion. Mild swelling but no hemorrhage or mass effect. Additionally, there are multiple clustered foci of acute infarction within the deep white matter on the right within the radiating white matter tracts most consistent with hypoperfusion or watershed infarctions. Likewise, there is mild swelling but no hemorrhage or mass effect. There are no acute cortically based infarctions affecting the supratentorial brain. Background pattern of chronic small vessel ischemic change affects the pons, basal ganglia and the cerebral hemispheric white matter. No hydrocephalus. No extra-axial collection. No sign of mass lesion. After  contrast administration, no abnormal enhancement occurs. Vascular: Major vessels at the base of the brain show flow. See below. Skull and upper cervical spine: Negative Sinuses/Orbits: Ordinary mucosal inflammatory changes of the maxillary sinuses. Orbits negative. Other: None MRA HEAD FINDINGS Both internal carotid arteries are patent through the skull base. On the left, the anterior and middle cerebral vessels show flow, similar to the previous study. On the right, there is a new severe stenosis/subtotal occlusion of the supraclinoid ICA/proximal MCA. Left vertebral artery remains patent to the basilar. Flow is visible within the left posterior inferior cerebellar artery. There is motion degradation on today's exam which precludes more distal evaluation. The right vertebral artery is a disease vessel which does not show flow to the basilar on today's study. This could be in part technical. Atherosclerotic irregularity of the basilar artery. Distal posterior circulation branch vessels are poorly seen. IMPRESSION: Acute infarction within the left posterior inferior cerebellar artery territory. Mild swelling but no hemorrhage or mass effect. Flow persists within the left vertebral artery and at least to some extent within the left posterior inferior cerebellar artery. Multiple acute infarctions within the deep white matter/radiating white matter tracts of the right cerebral hemisphere, most consistent with watershed or hypoperfusion infarctions. Mild swelling but no hemorrhage or mass effect. 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 The intracranial MR angiogram is motion degraded compared to previous studies. Both anterior cerebral arteries receive there supply from the left carotid circulation. No change seen on the left in the anterior circulation. The right supraclinoid ICA/proximal MCA stenosis is new. These results were called by telephone at the time of  interpretation on 12/23/2017 at 10:15 am to Dr. [name] , who verbally acknowledged these results. Electronically Signed   By: Nelson Chimes M.D.   On: 12/23/2017 10:15   Mr Brain W And Wo Contrast  Result Date: 12/23/2017 CLINICAL DATA:  facial pain. Diabetes and hypertension. Abnormal head CT EXAM: MRI HEAD WITHOUT AND WITH CONTRAST MRA HEAD WITHOUT CONTRAST TECHNIQUE: Multiplanar, multiecho pulse sequences of the brain and surrounding structures were obtained without and with intravenous contrast. Angiographic images of the head were obtained using MRA technique without contrast. CONTRAST:  6 cc Gadavist COMPARISON:  CT same day.  MRI 05/22/2017 FINDINGS: MRI HEAD FINDINGS Brain: Diffusion imaging shows a 4 cm region of acute infarction at the inferior cerebellum on the left consistent with left posterior inferior cerebellar artery branch vessel occlusion. Mild swelling but no hemorrhage or mass effect. Additionally, there are multiple clustered foci of acute infarction within the deep white matter on the right within the radiating white matter tracts most consistent with hypoperfusion or watershed infarctions. Likewise, there is mild swelling but no hemorrhage or mass effect. There are no acute cortically based infarctions affecting the supratentorial brain. Background pattern of chronic small vessel ischemic change  affects the pons, basal ganglia and the cerebral hemispheric white matter. No hydrocephalus. No extra-axial collection. No sign of mass lesion. After contrast administration, no abnormal enhancement occurs. Vascular: Major vessels at the base of the brain show flow. See below. Skull and upper cervical spine: Negative Sinuses/Orbits: Ordinary mucosal inflammatory changes of the maxillary sinuses. Orbits negative. Other: None MRA HEAD FINDINGS Both internal carotid arteries are patent through the skull base. On the left, the anterior and middle cerebral vessels show flow, similar to the  previous study. On the right, there is a new severe stenosis/subtotal occlusion of the supraclinoid ICA/proximal MCA. Left vertebral artery remains patent to the basilar. Flow is visible within the left posterior inferior cerebellar artery. There is motion degradation on today's exam which precludes more distal evaluation. The right vertebral artery is a disease vessel which does not show flow to the basilar on today's study. This could be in part technical. Atherosclerotic irregularity of the basilar artery. Distal posterior circulation branch vessels are poorly seen. IMPRESSION: Acute infarction within the left posterior inferior cerebellar artery territory. Mild swelling but no hemorrhage or mass effect. Flow persists within the left vertebral artery and at least to some extent within the left posterior inferior cerebellar artery. Multiple acute infarctions within the deep white matter/radiating white matter tracts of the right cerebral hemisphere, most consistent with watershed or hypoperfusion infarctions. Mild swelling but no hemorrhage or mass effect. 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 The intracranial MR angiogram is motion degraded compared to previous studies. Both anterior cerebral arteries receive there supply from the left carotid circulation. No change seen on the left in the anterior circulation. The right supraclinoid ICA/proximal MCA stenosis is new. These results were called by telephone at the time of interpretation on 12/23/2017 at 10:15 am to Dr. [name] , who verbally acknowledged these results. Electronically Signed   By: Nelson Chimes M.D.   On: 12/23/2017 10:15   US Carotid Bilateral (at [facility] And [facility] Only)  Result Date: 12/23/2017 CLINICAL DATA:  Stroke.  History of heart murmur. EXAM: BILATERAL CAROTID DUPLEX ULTRASOUND TECHNIQUE: Pearline Cables scale imaging, color Doppler and duplex ultrasound were performed of bilateral carotid  and vertebral arteries in the neck. COMPARISON:  None. FINDINGS: Criteria: Quantification of carotid stenosis is based on velocity parameters that correlate the residual internal carotid diameter with NASCET-based stenosis levels, using the diameter of the distal internal carotid lumen as the denominator for stenosis measurement. The following velocity measurements were obtained: RIGHT ICA:  203/66 cm/sec CCA:  70/01 cm/sec SYSTOLIC ICA/CCA RATIO:  3.8 ECA:  200 cm/sec LEFT ICA:  94/30 cm/sec CCA:  74/94 cm/sec SYSTOLIC ICA/CCA RATIO:  1.2 ECA:  65 cm/sec RIGHT CAROTID ARTERY: There is a large amount of eccentric echogenic plaque within the right carotid bulb (image 24 and 27), which results in elevated peak systolic velocities within the proximal aspect of the right internal carotid artery. Greatest acquired peak systolic velocity within the proximal right ICA measures 203 centimeters/second (image 47). RIGHT VERTEBRAL ARTERY:  Antegrade Flow LEFT CAROTID ARTERY: There is a moderate amount of eccentric mixed echogenic plaque within the left carotid bulb (image 71 and 74), not resulting in elevated peak systolic velocities within the interrogated course of the left internal carotid artery to suggest a hemodynamically significant stenosis. LEFT VERTEBRAL ARTERY:  Antegrade Flow IMPRESSION: 1. Large amount of right-sided atherosclerotic plaque results in elevated peak systolic velocities within the right internal carotid artery  compatible with the higher end of the 50-69% luminal narrowing range. Further evaluation with CTA could performed as clinically indicated. 2. Moderate amount of left-sided atherosclerotic plaque, not resulting in a hemodynamically significant stenosis. Electronically Signed   By: Sandi Mariscal M.D.   On: 12/23/2017 15:06   Scheduled Meds: . amiodarone  100 mg Oral Daily  . aspirin  325 mg Oral Daily   Or  . aspirin  300 mg Rectal Daily  . atorvastatin  80 mg Oral q1800  . cloNIDine  0.1  mg Oral BID  . clotrimazole  1 Applicatorful Vaginal QHS  . enoxaparin (LOVENOX) injection  30 mg Subcutaneous Q24H  . folic acid  1 mg Oral Daily  . Influenza vac split quadrivalent PF  0.5 mL Intramuscular Tomorrow-1000  . insulin aspart  0-5 Units Subcutaneous QHS  . insulin aspart  0-9 Units Subcutaneous TID WC  . insulin aspart  3 Units Subcutaneous TID WC  . mouth rinse  15 mL Mouth Rinse BID  . multivitamin with minerals  1 tablet Oral Daily  . thiamine  100 mg Oral Daily   Or  . thiamine  100 mg Intravenous Daily   Continuous Infusions: . sodium chloride 50 mL/hr at 12/24/17 1000    Principal Problem:   Acute CVA (cerebrovascular accident) [facility]) Active Problems:   Essential hypertension   PSVT (paroxysmal supraventricular tachycardia) (HCC)   Chest pain   Diabetes mellitus, type II (HCC)   Tobacco abuse   Cocaine abuse ([place])   Generalized weakness   Hepatic cirrhosis (HCC)   GERD (gastroesophageal reflux disease)   Leukocytosis   Hypomagnesemia   Uncontrolled type 2 diabetes mellitus with hyperglycemia, with long-term current use of insulin (HCC)   CKD (chronic kidney disease), stage III (HCC)   Abnormal EKG   Critical Care Time spent: 32 minutes  Irwin Brakeman, MD, FAAFP [facility] Pager [phone]  If 7PM-7AM, please contact night-coverage [email or web address] Password [email or web address] 12/24/2017, 10:55 AM    LOS: 1 day

## 2017-12-24 NOTE — Consult Note (Addendum)
[place] A. Merlene Laughter, MD     [email or web address]          Cassandra Jordan is an 67 y.o. female.   ASSESSMENT/PLAN: 1.  Bihemispheric acute ischemic strokes: This raises the likelihood of cardioembolic phenomena either from arrhythmia such as atrial fibrillation or hypovolemia / hypoperfusion .  A 30-day event monitor is suggested.  2.  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  Vascular surgery consultation is recommended for the external carotid stenosis.  This is urgent but not emergent.  Dual antiplatelet agents of aspirin and Plavix is recommended.  Statin medications also recommended.  Avoid abrupt and rapid drop in blood pressure.  Treatment for the intracranial stenosis is maximal medical therapies.  Cessation of illicit drug use and nicotine particularly cocaine is discussed with the patient.  Because of the lack of definitive time onset, the patient is not a candidate for thrombolysis or thrombectomy.     The patient is a 67 year old black female who presents with the acute onset of frontal headaches.  She also had some chest pain.  The patient denies any dizziness, dysarthria, dysphasia focal numbness or weakness.  She denies any visual symptoms.  There may have been some gait instability but fleeting.  She apparently has a history of right carotid stenosis and was to see a vascular surgeon but did not go to this appointment.  She is on aspirin and reports being compliant with this.  She has a longstanding history of chronic cocaine use and admits to using cocaine on the day of the acute headache.  She recently has been diagnosed with obstructive sleep apnea syndrome.  She intends on using her CPAP machine.  She patient was drowsy which limits the review of systems although the review of system is otherwise unrevealing.   GENERAL: This is a pleasant female she is in no acute distress.  HEENT: This is supple.  No trauma  appreciated.  ABDOMEN: soft  EXTREMITIES: No edema   BACK: Normal  SKIN: Normal by inspection.    MENTAL STATUS: Alert and generally oriented although she states the month as January.  She states her age appropriately. Speech, language and cognition are generally intact. Judgment and insight normal. She follows commands briskly.   CRANIAL NERVES: Pupils are equal, round and reactive to light and accomodation; extra ocular movements are full, there is no significant nystagmus; visual fields a dense left homonymous hemianopia; upper and lower facial muscles are normal in strength and symmetric, there is no flattening of the nasolabial folds; tongue is midline; uvula is midline; shoulder elevation is normal.  MOTOR: She does not have a downward drift of the upper extremities but does have a mild pronator drift of the left upper extremity.  She has good strength in the upper extremities both proximally and distally.  She has a mild drift of the left leg and a more profound drift on the right.  Strength in the lower extremities are 4/5 on the right hip flexion and 4+/5 on the left.    COORDINATION: No tremors or dysmetria are appreciated.  No parkinsonism.  REFLEXES: Deep tendon reflexes are symmetrical and normal.   SENSATION: Normal to light touch, temperature, and pain.  No clear evidence of neglect on the left but she appears to have some right left disorientation.    NIH stroke scale 1, 2, 1, 2 total 6.   Blood pressure (!) 153/92, pulse 75, temperature 98.4 F (36.9 C), temperature  source Oral, resp. rate (!) 28, height 5' (1.524 m), weight 66.3 kg, SpO2 100 %.  Past Medical History:  Diagnosis Date  . Alcohol abuse   . Arthritis   . Asthma   . Cocaine abuse ([place])   . Essential hypertension   . GERD (gastroesophageal reflux disease)   . Hepatitis C   . Hyperlipidemia   . Lichen sclerosus et atrophicus 10/13/2014  . Neuropathic pain   . Noncompliance   . PSVT (paroxysmal  supraventricular tachycardia) ([place])   . Rash   . Renal carcinoma ([name])   . Sleep apnea   . Type 2 diabetes mellitus ([place])   . Vaginal irritation 10/01/2014  . Vaginal itching 10/01/2014  . Yeast infection of the vagina 10/01/2014    Past Surgical History:  Procedure Laterality Date  . ABDOMINAL HYSTERECTOMY    . COLONOSCOPY N/A 07/08/2013   RMR: Multiple colonic polyps treated [name] as described above. Inadequate prepartation comprimised examination there colonic diverticulosis. tubular and tubulovillous adenomas. next TCS 12/2013  . COLONOSCOPY N/A 02/09/2015   RMR: Colonic polyp removed as described above. Tubular adenoma. Next colonoscopy January 2022.  . ESOPHAGOGASTRODUODENOSCOPY N/A 07/08/2013   RMR: Schatzki's ring ; small hiatal hernia-status post passage of a Maloney dilator  . MALONEY DILATION N/A 07/08/2013   Procedure: [name] DILATION;  Surgeon: [name], MD;  Location: [facility];  Service: Endoscopy;  Laterality: N/A;  . ROBOT ASSISTED LAPAROSCOPIC NEPHRECTOMY Right 10/14/2013   Procedure: ROBOTIC ASSISTED LAPAROSCOPIC RADICAL NEPHRECTOMY;  Surgeon: [name], MD;  Location: [facility];  Service: Urology;  Laterality: Right;  . SAVORY DILATION N/A 07/08/2013   Procedure: SAVORY DILATION;  Surgeon: [name], MD;  Location: [facility];  Service: Endoscopy;  Laterality: N/A;  . TONSILLECTOMY    . Wart removal      Family History  Problem Relation Age of Onset  . Colon cancer Sister 56  . Cancer Mother   . Heart attack Father   . Hypertension Daughter   . Cancer Maternal Grandfather   . Diabetes Paternal Grandmother     Social History:  reports that she has been smoking cigarettes. She has a 4.00 pack-year smoking history. She has never used smokeless tobacco. She reports that she drank alcohol. She reports that she has current or past drug history. Drugs: Marijuana and Cocaine.  Allergies:  Allergies  Allergen Reactions  . Sulfa Antibiotics Itching  and Other (See Comments)    burning    Medications: Prior to Admission medications   Medication Sig Start Date End Date Taking? Authorizing Provider  amiodarone (PACERONE) 100 MG tablet Take 1 tablet (100 mg total) by mouth daily. TAKE ONE TABLET BY MOUTH ONCE DAILY 05/31/17  Yes Satira Sark, MD  amLODipine (NORVASC) 10 MG tablet Take 1 tablet (10 mg total) by mouth daily. 11/04/16  Yes TatShanon Brow, MD  aspirin EC 81 MG EC tablet Take 1 tablet (81 mg total) by mouth daily. 05/24/17  Yes Isaac Bliss, Rayford Halsted, MD  atorvastatin (LIPITOR) 40 MG tablet Take 1 tablet (40 mg total) by mouth daily at 6 PM. 05/23/17  Yes Isaac Bliss, Rayford Halsted, MD  carvedilol (COREG) 6.25 MG tablet Take 6.25 mg by mouth 2 (two) times daily. 08/21/17  Yes [provider]  cloNIDine (CATAPRES) 0.2 MG tablet Take 0.2 mg by mouth 2 (two) times daily. 08/21/17  Yes [provider]  furosemide (LASIX) 20 MG tablet Take 20 mg by mouth daily.  Yes [provider]  gabapentin (NEURONTIN) 600 MG tablet Take 1 tablet by mouth 3 (three) times daily. 04/17/17  Yes [provider]  LANTUS 100 UNIT/ML injection Inject 9 Units into the skin daily.  08/21/17  Yes [provider]  lisinopril-hydrochlorothiazide (PRINZIDE,ZESTORETIC) 20-25 MG tablet Take 1 tablet by mouth 2 (two) times daily.   Yes [provider]  pantoprazole (PROTONIX) 40 MG tablet Take 1 tablet (40 mg total) by mouth daily. Take 30 minutes before breakfast 03/06/16  Yes Annitta Needs, NP  potassium chloride (K-DUR) 10 MEQ tablet Take 1 tablet (10 mEq total) by mouth daily. 09/05/17   Noemi Chapel, MD    Scheduled Meds: . amiodarone  100 mg Oral Daily  . aspirin  325 mg Oral Daily   Or  . aspirin  300 mg Rectal Daily  . atorvastatin  80 mg Oral q1800  . cloNIDine  0.1 mg Oral BID  . clotrimazole  1 Applicatorful Vaginal QHS  . enoxaparin (LOVENOX) injection  30 mg Subcutaneous Q24H  . folic acid  1 mg  Oral Daily  . Influenza vac split quadrivalent PF  0.5 mL Intramuscular Tomorrow-1000  . insulin aspart  0-9 Units Subcutaneous TID WC  . mouth rinse  15 mL Mouth Rinse BID  . multivitamin with minerals  1 tablet Oral Daily  . thiamine  100 mg Oral Daily   Or  . thiamine  100 mg Intravenous Daily   Continuous Infusions: . sodium chloride 50 mL/hr at 12/24/17 1700   PRN Meds:.acetaminophen **OR** acetaminophen (TYLENOL) oral liquid 160 mg/5 mL **OR** acetaminophen, hydrALAZINE, LORazepam **OR** LORazepam, nitroGLYCERIN, senna-docusate     Results for orders placed or performed during the hospital encounter of 12/23/17 (from the past 48 hour(s))  Basic metabolic panel     Status: Abnormal   Collection Time: 12/23/17  7:55 AM  Result Value Ref Range   Sodium 140 135 - 145 mmol/L   Potassium 2.8 (L) 3.5 - 5.1 mmol/L   Chloride 99 98 - 111 mmol/L   CO2 31 22 - 32 mmol/L   Glucose, Bld 128 (H) 70 - 99 mg/dL   BUN 22 8 - 23 mg/dL   Creatinine, Ser 1.48 (H) 0.44 - 1.00 mg/dL   Calcium 8.8 (L) 8.9 - 10.3 mg/dL   GFR calc non Af Amer 37 (L) >60 mL/min   GFR calc Af Amer 42 (L) >60 mL/min   Anion gap 10 5 - 15    Comment: Performed at [facility], [address], [place], [place] [address]  I-stat troponin, ED     Status: None   Collection Time: 12/23/17  7:55 AM  Result Value Ref Range   Troponin i, poc 0.08 0.00 - 0.08 ng/mL   Comment 3            Comment: Due to the release kinetics of cTnI, a negative result within the first hours of the onset of symptoms does not rule out myocardial infarction with certainty. If myocardial infarction is still suspected, repeat the test at appropriate intervals.   CBC with Differential     Status: Abnormal   Collection Time: 12/23/17  7:55 AM  Result Value Ref Range   WBC 11.2 (H) 4.0 - 10.5 K/uL   RBC 4.64 3.87 - 5.11 MIL/uL   Hemoglobin 12.7 12.0 - 15.0 g/dL   HCT 41.3 36.0 - 46.0 %   MCV 89.0 80.0 - 100.0 fL   MCH 27.4 26.0 - 34.0  pg     MCHC 30.8 30.0 - 36.0 g/dL   RDW 12.8 11.5 - 15.5 %   Platelets 375 150 - 400 K/uL   nRBC 0.0 0.0 - 0.2 %   Neutrophils Relative % 61 %   Neutro Abs 6.8 1.7 - 7.7 K/uL   Lymphocytes Relative 28 %   Lymphs Abs 3.1 0.7 - 4.0 K/uL   Monocytes Relative 9 %   Monocytes Absolute 1.0 0.1 - 1.0 K/uL   Eosinophils Relative 1 %   Eosinophils Absolute 0.1 0.0 - 0.5 K/uL   Basophils Relative 1 %   Basophils Absolute 0.1 0.0 - 0.1 K/uL   Immature Granulocytes 0 %   Abs Immature Granulocytes 0.04 0.00 - 0.07 K/uL    Comment: Performed at [facility], [address], [place], [place] [address]  Magnesium     Status: None   Collection Time: 12/23/17  7:55 AM  Result Value Ref Range   Magnesium 1.9 1.7 - 2.4 mg/dL    Comment: Performed at [facility], [address] [place], [place] [address]  Hemoglobin A1c     Status: Abnormal   Collection Time: 12/23/17  7:55 AM  Result Value Ref Range   Hgb A1c MFr Bld 6.7 (H) 4.8 - 5.6 %    Comment: (NOTE) Pre diabetes:          5.7%-6.4% Diabetes:              >6.4% Glycemic control for   <7.0% adults with diabetes    Mean Plasma Glucose 145.59 mg/dL    Comment: Performed at [facility], [place] [address] [place], [place] [address]  I-stat chem 8, ed     Status: Abnormal   Collection Time: 12/23/17  7:57 AM  Result Value Ref Range   Sodium 140 135 - 145 mmol/L   Potassium 3.0 (L) 3.5 - 5.1 mmol/L   Chloride 99 98 - 111 mmol/L   BUN 23 8 - 23 mg/dL   Creatinine, Ser 1.40 (H) 0.44 - 1.00 mg/dL   Glucose, Bld 126 (H) 70 - 99 mg/dL   Calcium, Ion 1.12 (L) 1.15 - 1.40 mmol/L   TCO2 32 22 - 32 mmol/L   Hemoglobin 13.9 12.0 - 15.0 g/dL   HCT 41.0 36.0 - 46.0 %  Rapid urine drug screen (hospital performed)     Status: Abnormal   Collection Time: 12/23/17  7:57 AM  Result Value Ref Range   Opiates NONE DETECTED NONE DETECTED   Cocaine POSITIVE (A) NONE DETECTED   Benzodiazepines NONE DETECTED NONE DETECTED   Amphetamines NONE DETECTED NONE  DETECTED   Tetrahydrocannabinol NONE DETECTED NONE DETECTED   Barbiturates NONE DETECTED NONE DETECTED    Comment: (NOTE) DRUG SCREEN FOR MEDICAL PURPOSES ONLY.  IF CONFIRMATION IS NEEDED FOR ANY PURPOSE, NOTIFY LAB WITHIN 5 DAYS. LOWEST DETECTABLE LIMITS FOR URINE DRUG SCREEN Drug Class                     Cutoff (ng/mL) Amphetamine and metabolites    1000 Barbiturate and metabolites    200 Benzodiazepine                 665 Tricyclics and metabolites     300 Opiates and metabolites        300 Cocaine and metabolites        300 THC  44 Performed at [facility], [address], [place], [place] [address]   HIV antibody (Routine Testing)     Status: None   Collection Time: 12/23/17 12:11 PM  Result Value Ref Range   HIV Screen 4th Generation wRfx Non Reactive Non Reactive    Comment: (NOTE) Performed At: [facility] [place], [place] [id number] Rush Farmer MD ID:[id number]   Troponin I - Now Then Q6H     Status: Abnormal   Collection Time: 12/23/17 12:11 PM  Result Value Ref Range   Troponin I 0.07 (HH) <0.03 ng/mL    Comment: CRITICAL RESULT CALLED TO, READ BACK BY AND VERIFIED WITH: MURRHY,RN AT 1339 ON 12.2.2019 BY ISLEY,B Performed at [facility], [address] [place], [place] [address]   MRSA PCR Screening     Status: None   Collection Time: 12/23/17 12:20 PM  Result Value Ref Range   MRSA by PCR NEGATIVE NEGATIVE    Comment:        The GeneXpert MRSA Assay (FDA approved for NASAL specimens only), is one component of a comprehensive MRSA colonization surveillance program. It is not intended to diagnose MRSA infection nor to guide or monitor treatment for MRSA infections. Performed at [facility], [address] [place], [place] [address]   Glucose, capillary     Status: Abnormal   Collection Time: 12/23/17  4:43 PM  Result Value Ref Range   Glucose-Capillary 141 (H) 70 - 99 mg/dL  Glucose, capillary      Status: Abnormal   Collection Time: 12/23/17  8:52 PM  Result Value Ref Range   Glucose-Capillary 110 (H) 70 - 99 mg/dL  Lipid panel     Status: Abnormal   Collection Time: 12/24/17  4:01 AM  Result Value Ref Range   Cholesterol 280 (H) 0 - 200 mg/dL   Triglycerides 163 (H) <150 mg/dL   HDL 52 >40 mg/dL   Total CHOL/HDL Ratio 5.4 RATIO   VLDL 33 0 - 40 mg/dL   LDL Cholesterol 195 (H) 0 - 99 mg/dL    Comment:        Total Cholesterol/HDL:CHD Risk Coronary Heart Disease Risk Table                     Men   Women  1/2 Average Risk   3.4   3.3  Average Risk       5.0   4.4  2 X Average Risk   9.6   7.1  3 X Average Risk  23.4   11.0        Use the calculated Patient Ratio above and the CHD Risk Table to determine the patient's CHD Risk.        ATP III CLASSIFICATION (LDL):  <100     mg/dL   Optimal  100-129  mg/dL   Near or Above                    Optimal  130-159  mg/dL   Borderline  160-189  mg/dL   High  >190     mg/dL   Very High Performed at [facility] [place], [place] [address]   Comprehensive metabolic panel     Status: Abnormal   Collection Time: 12/24/17  4:01 AM  Result Value Ref Range   Sodium 139 135 - 145 mmol/L   Potassium 4.6 3.5 - 5.1 mmol/L    Comment: DELTA CHECK NOTED  Chloride 110 98 - 111 mmol/L   CO2 22 22 - 32 mmol/L   Glucose, Bld 127 (H) 70 - 99 mg/dL   BUN 23 8 - 23 mg/dL   Creatinine, Ser 1.62 (H) 0.44 - 1.00 mg/dL   Calcium 8.0 (L) 8.9 - 10.3 mg/dL   Total Protein 6.6 6.5 - 8.1 g/dL   Albumin 2.4 (L) 3.5 - 5.0 g/dL   AST 24 15 - 41 U/L   ALT 11 0 - 44 U/L   Alkaline Phosphatase 41 38 - 126 U/L   Total Bilirubin 0.5 0.3 - 1.2 mg/dL   GFR calc non Af Amer 33 (L) >60 mL/min   GFR calc Af Amer 38 (L) >60 mL/min   Anion gap 7 5 - 15    Comment: Performed at [facility], [address] [place], [place] [address]  Magnesium     Status: None   Collection Time: 12/24/17  4:01 AM  Result Value Ref Range   Magnesium 1.8  1.7 - 2.4 mg/dL    Comment: Performed at [facility], [address] [place], [place] [address]  Glucose, capillary     Status: Abnormal   Collection Time: 12/24/17  7:22 AM  Result Value Ref Range   Glucose-Capillary 119 (H) 70 - 99 mg/dL  Glucose, capillary     Status: Abnormal   Collection Time: 12/24/17 11:14 AM  Result Value Ref Range   Glucose-Capillary 140 (H) 70 - 99 mg/dL  Glucose, capillary     Status: Abnormal   Collection Time: 12/24/17  4:22 PM  Result Value Ref Range   Glucose-Capillary 69 (L) 70 - 99 mg/dL   Comment 1 Notify RN   Glucose, capillary     Status: Abnormal   Collection Time: 12/24/17  5:42 PM  Result Value Ref Range   Glucose-Capillary 110 (H) 70 - 99 mg/dL    Studies/Results:  ECHO - Left ventricle: The cavity size was normal. Wall thickness was   increased in a pattern of mild LVH. Systolic function was mildly   reduced. The estimated ejection fraction was in the range of 45%   to 50%. There is hypokinesis of the mid-apicalanteroseptal and   apical myocardium. Features are consistent with a pseudonormal   left ventricular filling pattern, with concomitant abnormal   relaxation and increased filling pressure (grade 2 diastolic   dysfunction). - Aortic valve: Mildly to moderately calcified annulus. Probably   trileaflet; mildly calcified leaflets. - Mitral valve: Mildly calcified annulus. There was mild   regurgitation. - Right atrium: Central venous pressure (est): 3 mm Hg. - Atrial septum: No defect or patent foramen ovale was identified. - Tricuspid valve: There was trivial regurgitation. - Pulmonary arteries: PA peak pressure: 43 mm Hg (S). - Pericardium, extracardiac: There was no pericardial effusion.      CAROTID DOPPLERS IMPRESSION: 1. Large amount of right-sided atherosclerotic plaque results in elevated peak systolic velocities within the right internal carotid artery compatible with the higher end of the 50-69%  luminal narrowing range. Further evaluation with CTA could performed as clinically indicated. 2. Moderate amount of left-sided atherosclerotic plaque, not resulting in a hemodynamically significant stenosis.  (RIGHT  ICA:  203/66 cm/sec  CCA:  33/82 cm/sec  SYSTOLIC ICA/CCA RATIO:  3.8)     BRAIN MRI MRA FINDINGS: MRI HEAD FINDINGS  Brain: Diffusion imaging shows a 4 cm region of acute infarction at the inferior cerebellum on the left consistent with left posterior inferior cerebellar artery branch vessel  occlusion. Mild swelling but no hemorrhage or mass effect. Additionally, there are multiple clustered foci of acute infarction within the deep white matter on the right within the radiating white matter tracts most consistent with hypoperfusion or watershed infarctions. Likewise, there is mild swelling but no hemorrhage or mass effect. There are no acute cortically based infarctions affecting the supratentorial brain.  Background pattern of chronic small vessel ischemic change affects the pons, basal ganglia and the cerebral hemispheric white matter. No hydrocephalus. No extra-axial collection. No sign of mass lesion. After contrast administration, no abnormal enhancement occurs.  Vascular: Major vessels at the base of the brain show flow. See below.  Skull and upper cervical spine: Negative  Sinuses/Orbits: Ordinary mucosal inflammatory changes of the maxillary sinuses. Orbits negative.  Other: None  MRA HEAD FINDINGS  Both internal carotid arteries are patent through the skull base. On the left, the anterior and middle cerebral vessels show flow, similar to the previous study. On the right, there is a new severe stenosis/subtotal occlusion of the supraclinoid ICA/proximal MCA.  Left vertebral artery remains patent to the basilar. Flow is visible within the left posterior inferior cerebellar artery. There is motion degradation on today's exam which  precludes more distal evaluation. The right vertebral artery is a disease vessel which does not show flow to the basilar on today's study. This could be in part technical. Atherosclerotic irregularity of the basilar artery. Distal posterior circulation branch vessels are poorly seen.  IMPRESSION: Acute infarction within the left posterior inferior cerebellar artery territory. Mild swelling but no hemorrhage or mass effect. Flow persists within the left vertebral artery and at least to some extent within the left posterior inferior cerebellar artery.  Multiple acute infarctions within the deep white matter/radiating white matter tracts of the right cerebral hemisphere, most consistent with watershed or hypoperfusion infarctions. Mild swelling but no hemorrhage or mass effect. 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  The intracranial MR angiogram is motion degraded compared to previous studies. Both anterior cerebral arteries receive there supply from the left carotid circulation. No change seen on the left in the anterior circulation. The right supraclinoid ICA/proximal MCA stenosis is new.        The brain MRI and MRA are both reviewed in person.   There is bright signal involving the inferior year posterior aspect of the cerebellum on the left side.  It is rather extensive and likely represents distribution of the PICA.  There is also similar findings involving deep white matter on the right side in a paramedian distribution involving the centrum semiovale both anterior and posteriorly and also involving the atrium on the same side.  This is suggestive of watershed infarct.    There is marked confluent and the plaque-like deep white matter findings involving both hemispheres.  There is also reduced signal /evidence of black holes involving the deep white matter/centrum semiovale anteriorly and posteriorly on the right side  suggestive of remote ischemia. MRA shows evidence of cut off of the right ICA were the MCA branches.  There is also absent of A1 segment on the same side.  The imaging are degraded by motion artifact.       Tylan Kinn A. Merlene Laughter, M.D.  Diplomate, Tax adviser of Psychiatry and Neurology ( Neurology). 12/24/2017, 6:31 PM

## 2017-12-24 NOTE — Progress Notes (Signed)
*  PRELIMINARY RESULTS* Echocardiogram 2D Echocardiogram has been performed.  Leavy Cella 12/24/2017, 2:32 PM

## 2017-12-24 NOTE — Evaluation (Signed)
Physical Therapy Evaluation Patient Details Name: Cassandra Jordan MRN: [id number] DOB: September 11, 1950 Today's Date: 12/24/2017   History of Present Illness  Cassandra Jordan is a 67 y.o. female chronic cocaine abuser, smoker, diabetic, with known cerebrovascular and heart disease presented to ED with complaints of a frontal headache that started this morning.  The patient has since resolved but she initially describe pain as a dull headache 9/10 associtated with mild nausea and photophobia.  She also reported dull 8/10 chest pain and pressure described as achy dull pain in upper sternal area.  She was given nitroglycerin in ED and now she says she does not have chest pain.  She says she last used cocaine yesterday evening around 7pm.  She denies shortness of breath.  She has a strong family history of heart disease and premature cardiac death in her father that does in 79s of heart disease.     Clinical Impression  Patient presents alert and cooperative, requires occasional repeated verbal cues to follow instructions, demonstrates slow labored movement for sitting up at bedside, no significant difference in strength left vs right, ambulated in hallway without loss of balance, but required the use of RW for safety.  Patient states her family can assist her 24 hours/day if needed.  Patient put back to bed after therapy secondary to fatigue and mildly lethargic.  Patient will benefit from continued physical therapy in hospital and recommended venue below to increase strength, balance, endurance for safe ADLs and gait.    Follow Up Recommendations Home health PT;Supervision for mobility/OOB;Supervision - Intermittent    Equipment Recommendations  None recommended by PT    Recommendations for Other Services       Precautions / Restrictions Precautions Precautions: Fall Restrictions Weight Bearing Restrictions: No      Mobility  Bed Mobility Overal bed mobility: Needs Assistance Bed Mobility:  Supine to Sit;Sit to Supine     Supine to sit: Min guard Sit to supine: Min guard   General bed mobility comments: slow labored movement  Transfers Overall transfer level: Needs assistance Equipment used: Rolling walker (2 wheeled) Transfers: Sit to/from [facility] Sit to Stand: Min guard Stand pivot transfers: Min guard       General transfer comment: slightly unsteady on feet  Ambulation/Gait Ambulation/Gait assistance: Min guard;Min assist Gait Distance (Feet): 45 Feet Assistive device: Rolling walker (2 wheeled) Gait Pattern/deviations: Decreased step length - right;Decreased step length - left;Decreased stride length Gait velocity: decreased   General Gait Details: slow slightly labored cadence without loss of balance, limited secondary to c/o fatigue  Stairs            Wheelchair Mobility    Modified Rankin (Stroke Patients Only)       Balance Overall balance assessment: Needs assistance Sitting-balance support: Feet supported;No upper extremity supported Sitting balance-Leahy Scale: Good     Standing balance support: No upper extremity supported;During functional activity Standing balance-Leahy Scale: Poor Standing balance comment: fair/poor without AD, fair using RW                             Pertinent Vitals/Pain Pain Assessment: No/denies pain    Home Living Family/patient expects to be discharged to:: Private residence Living Arrangements: Alone Available Help at Discharge: Family;Friend(s);Neighbor;Available PRN/intermittently Type of Home: Apartment Home Access: Level entry     Home Layout: One level Home Equipment: Cane - single point;Walker - 2 wheels;Shower seat;Bedside commode  Prior Function Level of Independence: Independent with assistive device(s)         Comments: community ambulator with SPC, drives     Hand Dominance   Dominant Hand: Right    Extremity/Trunk Assessment   Upper  Extremity Assessment Upper Extremity Assessment: Defer to OT evaluation    Lower Extremity Assessment Lower Extremity Assessment: Generalized weakness    Cervical / Trunk Assessment Cervical / Trunk Assessment: Normal  Communication   Communication: No difficulties  Cognition Arousal/Alertness: Awake/alert Behavior During Therapy: WFL for tasks assessed/performed Overall Cognitive Status: Within Functional Limits for tasks assessed                                        General Comments      Exercises     Assessment/Plan    PT Assessment Patient needs continued PT services  PT Problem List Decreased activity tolerance;Decreased strength;Decreased balance;Decreased mobility       PT Treatment Interventions Gait training;Stair training;Functional mobility training;Therapeutic activities;Therapeutic exercise;Patient/family education    PT Goals (Current goals can be found in the Care Plan section)  Acute Rehab PT Goals Patient Stated Goal: return home with family to assist PT Goal Formulation: With patient Time For Goal Achievement: 12/31/17 Potential to Achieve Goals: Good    Frequency 7X/week   Barriers to discharge        Co-evaluation               AM-PAC PT "6 Clicks" Mobility  Outcome Measure Help needed turning from your back to your side while in a flat bed without using bedrails?: None Help needed moving from lying on your back to sitting on the side of a flat bed without using bedrails?: Total Help needed moving to and from a bed to a chair (including a wheelchair)?: Total Help needed standing up from a chair using your arms (e.g., wheelchair or bedside chair)?: A Little Help needed to walk in hospital room?: A Little Help needed climbing 3-5 steps with a railing? : A Little 6 Click Score: 15    End of Session   Activity Tolerance: Patient tolerated treatment well;Patient limited by fatigue Patient left: in bed;with call  bell/phone within reach Nurse Communication: Mobility status PT Visit Diagnosis: Unsteadiness on feet (R26.81);Other abnormalities of gait and mobility (R26.89);Muscle weakness (generalized) (M62.81)    Time: 8563-1497 PT Time Calculation (min) (ACUTE ONLY): 24 min   Charges:   PT Evaluation $PT Eval Moderate Complexity: 1 Mod PT Treatments $Therapeutic Activity: 23-37 mins        1:51 PM, 12/24/17 Lonell Grandchild, MPT Physical Therapist with [facility] 336 [phone] office [phone] mobile phone

## 2017-12-24 NOTE — Progress Notes (Signed)
Patient experiencing vaginal itching and burning. Patient stated she takes Monistat at home. Will pass on to day shift RN

## 2017-12-24 NOTE — Progress Notes (Signed)
OT Cancellation Note  Patient Details Name: ESTHA FEW MRN: [id number] DOB: 24-Apr-1950   Cancelled Treatment:    Reason Eval/Treat Not Completed: Fatigue/lethargy limiting ability to participate. Attempted to see pt this am, pt lethargic and unable to remain awake, often falling asleep when gathering PLOF information. Pt unable remain awake to participate in bed level neuro testing, therefore unable to gain accurate information, did not attempt mobility due to lethargy. Will attempt evaluation at a later time.   Guadelupe Sabin, OTR/L  [phone] 12/24/2017, 8:17 AM

## 2017-12-24 NOTE — Plan of Care (Signed)
  Problem: Acute Rehab PT Goals(only PT should resolve) Goal: Pt Will Go Supine/Side To Sit Outcome: Progressing Flowsheets (Taken 12/24/2017 1356) Pt will go Supine/Side to Sit: with supervision Goal: Patient Will Transfer Sit To/From Stand Outcome: Progressing Flowsheets (Taken 12/24/2017 1356) Patient will transfer sit to/from stand: with supervision Goal: Pt Will Transfer Bed To Chair/Chair To Bed Outcome: Progressing Flowsheets (Taken 12/24/2017 1356) Pt will Transfer Bed to Chair/Chair to Bed: with supervision Goal: Pt Will Ambulate Outcome: Progressing Flowsheets (Taken 12/24/2017 1356) Pt will Ambulate: 100 feet; with cane; with rolling walker; with supervision   1:57 PM, 12/24/17 Lonell Grandchild, MPT Physical Therapist with [facility] 336 [phone] office [phone] mobile phone

## 2017-12-24 NOTE — Progress Notes (Signed)
Patient currently off CPAP and back on 2 lpm nasal cannula. RN reports that patient wouldn't keep mask on after requesting to wear it since the start of shift. Unit still at bedside if needed.

## 2017-12-24 NOTE — Progress Notes (Addendum)
Pt last blood pressure was 201/123. Pt is not complaining of any headache, blurred vsion, etc. Administered PRN hydralazine and will continue to monitor.  Pt also requested a CPAP stating she was supposed to start wearing one at home. Respiratory was called.

## 2017-12-25 ENCOUNTER — Telehealth: Payer: Self-pay

## 2017-12-25 ENCOUNTER — Other Ambulatory Visit: Payer: Self-pay

## 2017-12-25 DIAGNOSIS — I429 Cardiomyopathy, unspecified: Secondary | ICD-10-CM

## 2017-12-25 DIAGNOSIS — I639 Cerebral infarction, unspecified: Secondary | ICD-10-CM

## 2017-12-25 LAB — COMPREHENSIVE METABOLIC PANEL
ALT: 14 U/L (ref 0–44)
AST: 36 U/L (ref 15–41)
Albumin: 2.8 g/dL — ABNORMAL LOW (ref 3.5–5.0)
Alkaline Phosphatase: 47 U/L (ref 38–126)
Anion gap: 9 (ref 5–15)
BUN: 20 mg/dL (ref 8–23)
CO2: 21 mmol/L — ABNORMAL LOW (ref 22–32)
Calcium: 8.7 mg/dL — ABNORMAL LOW (ref 8.9–10.3)
Chloride: 108 mmol/L (ref 98–111)
Creatinine, Ser: 1.31 mg/dL — ABNORMAL HIGH (ref 0.44–1.00)
GFR calc Af Amer: 49 mL/min — ABNORMAL LOW (ref >60)
GFR calc non Af Amer: 42 mL/min — ABNORMAL LOW (ref >60)
Glucose, Bld: 120 mg/dL — ABNORMAL HIGH (ref 70–99)
Potassium: 4.1 mmol/L (ref 3.5–5.1)
Sodium: 138 mmol/L (ref 135–145)
Total Bilirubin: 0.6 mg/dL (ref 0.3–1.2)
Total Protein: 7.5 g/dL (ref 6.5–8.1)

## 2017-12-25 LAB — CBC WITH DIFFERENTIAL/PLATELET
Abs Immature Granulocytes: 0.22 10*3/uL — ABNORMAL HIGH (ref 0.00–0.07)
BASOS ABS: 0.1 10*3/uL (ref 0.0–0.1)
Basophils Relative: 0 %
Eosinophils Absolute: 0 10*3/uL (ref 0.0–0.5)
Eosinophils Relative: 0 %
HCT: 36.4 % (ref 36.0–46.0)
HEMOGLOBIN: 11.4 g/dL — AB (ref 12.0–15.0)
Immature Granulocytes: 2 %
Lymphocytes Relative: 20 %
Lymphs Abs: 2.9 10*3/uL (ref 0.7–4.0)
MCH: 28.3 pg (ref 26.0–34.0)
MCHC: 31.3 g/dL (ref 30.0–36.0)
MCV: 90.3 fL (ref 80.0–100.0)
Monocytes Absolute: 1.1 10*3/uL — ABNORMAL HIGH (ref 0.1–1.0)
Monocytes Relative: 8 %
Neutro Abs: 10.3 10*3/uL — ABNORMAL HIGH (ref 1.7–7.7)
Neutrophils Relative %: 70 %
Platelets: 375 10*3/uL (ref 150–400)
RBC: 4.03 MIL/uL (ref 3.87–5.11)
RDW: 13.2 % (ref 11.5–15.5)
WBC: 14.6 10*3/uL — ABNORMAL HIGH (ref 4.0–10.5)
nRBC: 0 % (ref 0.0–0.2)

## 2017-12-25 LAB — GLUCOSE, CAPILLARY
Glucose-Capillary: 141 mg/dL — ABNORMAL HIGH (ref 70–99)
Glucose-Capillary: 143 mg/dL — ABNORMAL HIGH (ref 70–99)
Glucose-Capillary: 123 mg/dL — ABNORMAL HIGH (ref 70–99)
Glucose-Capillary: 90 mg/dL (ref 70–99)
Glucose-Capillary: 98 mg/dL (ref 70–99)

## 2017-12-25 LAB — MAGNESIUM: Magnesium: 2.3 mg/dL (ref 1.7–2.4)

## 2017-12-25 MED ORDER — LORAZEPAM 2 MG/ML IJ SOLN
4.0000 mg | INTRAMUSCULAR | Status: DC | PRN
  Administered 2017-12-25 (×8): 4 mg via INTRAVENOUS
  Filled 2017-12-25 (×8): qty 2

## 2017-12-25 MED ORDER — CARVEDILOL 3.125 MG PO TABS
3.1250 mg | ORAL_TABLET | Freq: Two times a day (BID) | ORAL | Status: DC
  Administered 2017-12-25: 3.125 mg via ORAL
  Filled 2017-12-25: qty 1

## 2017-12-25 MED ORDER — LORAZEPAM 2 MG/ML IJ SOLN
2.0000 mg | Freq: Once | INTRAMUSCULAR | Status: AC
  Administered 2017-12-25: 2 mg via INTRAVENOUS
  Filled 2017-12-25 (×2): qty 1

## 2017-12-25 MED ORDER — MAGNESIUM SULFATE 2 GM/50ML IV SOLN
2.0000 g | Freq: Once | INTRAVENOUS | Status: AC
  Administered 2017-12-25: 2 g via INTRAVENOUS
  Filled 2017-12-25: qty 50

## 2017-12-25 MED ORDER — METOPROLOL TARTRATE 5 MG/5ML IV SOLN
5.0000 mg | Freq: Once | INTRAVENOUS | Status: AC
  Administered 2017-12-25: 5 mg via INTRAVENOUS
  Filled 2017-12-25: qty 5

## 2017-12-25 MED ORDER — MORPHINE SULFATE (PF) 2 MG/ML IV SOLN
2.0000 mg | Freq: Once | INTRAVENOUS | Status: AC
  Administered 2017-12-25: 2 mg via INTRAVENOUS
  Filled 2017-12-25: qty 1

## 2017-12-25 MED ORDER — LORAZEPAM 2 MG/ML IJ SOLN
2.0000 mg | Freq: Once | INTRAMUSCULAR | Status: AC
  Administered 2017-12-25: 2 mg via INTRAVENOUS
  Filled 2017-12-25: qty 1

## 2017-12-25 MED ORDER — MIDAZOLAM HCL 2 MG/2ML IJ SOLN
1.0000 mg | Freq: Once | INTRAMUSCULAR | Status: AC
  Administered 2017-12-25: 1 mg via INTRAVENOUS
  Filled 2017-12-25: qty 2

## 2017-12-25 MED ORDER — ENOXAPARIN SODIUM 40 MG/0.4ML ~~LOC~~ SOLN
40.0000 mg | SUBCUTANEOUS | Status: DC
  Administered 2017-12-25: 40 mg via SUBCUTANEOUS
  Filled 2017-12-25: qty 0.4

## 2017-12-25 MED ORDER — SODIUM CHLORIDE 0.9 % IV SOLN
INTRAVENOUS | Status: AC
  Administered 2017-12-25 – 2017-12-26 (×2): via INTRAVENOUS

## 2017-12-25 NOTE — Telephone Encounter (Signed)
-----   Message from Desma Paganini sent at 12/25/2017 11:01 AM EST ----- Dr. Wynetta Emery wants to order a 30 day monitor on this pt for Acute CVA, Acute Stroke   She's currently in ICU

## 2017-12-25 NOTE — Care Management Note (Addendum)
Case Management Note  Patient Details  Name: Cassandra Jordan MRN: [id number] Date of Birth: 1950/06/03  Subjective/Objective:                  CVA. From home alone. Usually independent.  Recommended for [facility] PT if patient can have family assistance.  Per nurse, family can not assist patient.  Patient is UDS + and will not be eligible for SNF placement.  Patient currently in withdrawals. After patient is finished detoxing patient will hopefully be able to go back home with Home health.      Action/Plan: CM will follow and arrange home health when patient discharges. Patient currently confused.   Expected Discharge Date:   unk               Expected Discharge Plan:  [place]  In-House Referral:  Clinical Social Work  Discharge planning Services  CM Consult  Post Acute Care Choice:    Choice offered to:     DME Arranged:    DME Agency:     HH Arranged:    [place] Agency:     Status of Service:  In process, will continue to follow  If discussed at Long Length of Stay Meetings, dates discussed:    Additional Comments:  Garv Kuechle, Chauncey Reading, RN 12/25/2017, 11:29 AM

## 2017-12-25 NOTE — Progress Notes (Signed)
Pt continues to attempt to get out of bed, throwing legs over the side rails of the bed. Continues to pull oxygen sensor off and EKG leads. Sitting with patient until she will rest.

## 2017-12-25 NOTE — Progress Notes (Signed)
Progress Note  Patient Name: Cassandra Jordan Date of Encounter: 12/25/2017  Primary Cardiologist: Dr. Satira Sark  Subjective   Patient has had increasing agitation since yesterday, chart reviewed.  Inpatient Medications    Scheduled Meds: . amiodarone  100 mg Oral Daily  . aspirin  325 mg Oral Daily   Or  . aspirin  300 mg Rectal Daily  . atorvastatin  80 mg Oral q1800  . cloNIDine  0.1 mg Oral BID  . clopidogrel  75 mg Oral Q breakfast  . clotrimazole  1 Applicatorful Vaginal QHS  . enoxaparin (LOVENOX) injection  30 mg Subcutaneous Q24H  . folic acid  1 mg Oral Daily  . Influenza vac split quadrivalent PF  0.5 mL Intramuscular Tomorrow-1000  . insulin aspart  0-9 Units Subcutaneous TID WC  . mouth rinse  15 mL Mouth Rinse BID  . multivitamin with minerals  1 tablet Oral Daily  . thiamine  100 mg Oral Daily   Or  . thiamine  100 mg Intravenous Daily    PRN Meds: acetaminophen **OR** acetaminophen (TYLENOL) oral liquid 160 mg/5 mL **OR** acetaminophen, hydrALAZINE, LORazepam, nitroGLYCERIN, senna-docusate   Vital Signs    Vitals:   12/25/17 0500 12/25/17 0600 12/25/17 0640 12/25/17 0700  BP: (!) 193/109 (!) 142/126 (!) 144/130   Pulse: 96 93    Resp: 19 (!) 31    Temp:    98.1 F (36.7 C)  TempSrc:    Axillary  SpO2: 100% 100%    Weight:      Height:        Intake/Output Summary (Last 24 hours) at 12/25/2017 0902 Last data filed at 12/24/2017 2000 Gross per 24 hour  Intake 531.58 ml  Output 2 ml  Net 529.58 ml   Filed Weights   12/23/17 0641 12/23/17 1656 12/24/17 0455  Weight: 66.2 kg 65 kg 66.3 kg    Telemetry    Currently off telemetry.  Personally reviewed.  ECG    Tracing from 12/24/2017 shows sinus rhythm with prominent anterolateral T wave inversions and prolonged QT interval as before.  Personally reviewed.  Physical Exam   GEN: No acute distress.   Neck: No JVD. Cardiac: RRR, no gallop.  Respiratory: Nonlabored. Clear to  auscultation bilaterally. GI: Soft, nontender, bowel sounds present. MS: No edema; No deformity. Neuro:  Moving all extremities. Psych: Intermittently agitated, try to get out of bed, requiring consistent observation.  Labs    Chemistry Recent Labs  Lab 12/23/17 0755 12/23/17 0757 12/24/17 0401  NA 140 140 139  K 2.8* 3.0* 4.6  CL 99 99 110  CO2 31  --  22  GLUCOSE 128* 126* 127*  BUN 22 23 23   CREATININE 1.48* 1.40* 1.62*  CALCIUM 8.8*  --  8.0*  PROT  --   --  6.6  ALBUMIN  --   --  2.4*  AST  --   --  24  ALT  --   --  11  ALKPHOS  --   --  41  BILITOT  --   --  0.5  GFRNONAA 37*  --  33*  GFRAA 42*  --  38*  ANIONGAP 10  --  7     Hematology Recent Labs  Lab 12/23/17 0755 12/23/17 0757  WBC 11.2*  --   RBC 4.64  --   HGB 12.7 13.9  HCT 41.3 41.0  MCV 89.0  --   MCH 27.4  --  MCHC 30.8  --   RDW 12.8  --   PLT 375  --     Cardiac Enzymes Recent Labs  Lab 12/23/17 1211  TROPONINI 0.07*    Recent Labs  Lab 12/23/17 0755  TROPIPOC 0.08     Radiology    Mr Jodene Nam Head Wo Contrast  Result Date: 12/23/2017 CLINICAL DATA:  facial pain. Diabetes and hypertension. Abnormal head CT EXAM: MRI HEAD WITHOUT AND WITH CONTRAST MRA HEAD WITHOUT CONTRAST TECHNIQUE: Multiplanar, multiecho pulse sequences of the brain and surrounding structures were obtained without and with intravenous contrast. Angiographic images of the head were obtained using MRA technique without contrast. CONTRAST:  6 cc Gadavist COMPARISON:  CT same day.  MRI 05/22/2017 FINDINGS: MRI HEAD FINDINGS Brain: Diffusion imaging shows a 4 cm region of acute infarction at the inferior cerebellum on the left consistent with left posterior inferior cerebellar artery branch vessel occlusion. Mild swelling but no hemorrhage or mass effect. Additionally, there are multiple clustered foci of acute infarction within the deep white matter on the right within the radiating white matter tracts most consistent  with hypoperfusion or watershed infarctions. Likewise, there is mild swelling but no hemorrhage or mass effect. There are no acute cortically based infarctions affecting the supratentorial brain. Background pattern of chronic small vessel ischemic change affects the pons, basal ganglia and the cerebral hemispheric white matter. No hydrocephalus. No extra-axial collection. No sign of mass lesion. After contrast administration, no abnormal enhancement occurs. Vascular: Major vessels at the base of the brain show flow. See below. Skull and upper cervical spine: Negative Sinuses/Orbits: Ordinary mucosal inflammatory changes of the maxillary sinuses. Orbits negative. Other: None MRA HEAD FINDINGS Both internal carotid arteries are patent through the skull base. On the left, the anterior and middle cerebral vessels show flow, similar to the previous study. On the right, there is a new severe stenosis/subtotal occlusion of the supraclinoid ICA/proximal MCA. Left vertebral artery remains patent to the basilar. Flow is visible within the left posterior inferior cerebellar artery. There is motion degradation on today's exam which precludes more distal evaluation. The right vertebral artery is a disease vessel which does not show flow to the basilar on today's study. This could be in part technical. Atherosclerotic irregularity of the basilar artery. Distal posterior circulation branch vessels are poorly seen. IMPRESSION: Acute infarction within the left posterior inferior cerebellar artery territory. Mild swelling but no hemorrhage or mass effect. Flow persists within the left vertebral artery and at least to some extent within the left posterior inferior cerebellar artery. Multiple acute infarctions within the deep white matter/radiating white matter tracts of the right cerebral hemisphere, most consistent with watershed or hypoperfusion infarctions. Mild swelling but no hemorrhage or mass effect. Newly seen severe stenosis  or subtotal occlusion of the right supraclinoid ICA/proximal right MCA. The intracranial MR angiogram is motion degraded compared to previous studies. Both anterior cerebral arteries receive there supply from the left carotid circulation. No change seen on the left in the anterior circulation. The right supraclinoid ICA/proximal MCA stenosis is new. These results were called by telephone at the time of interpretation on 12/23/2017 at 10:15 am to Dr. [name] , who verbally acknowledged these results. Electronically Signed   By: Nelson Chimes M.D.   On: 12/23/2017 10:15   Mr Brain W And Wo Contrast  Result Date: 12/23/2017 CLINICAL DATA:  facial pain. Diabetes and hypertension. Abnormal head CT EXAM: MRI HEAD WITHOUT AND WITH CONTRAST MRA HEAD WITHOUT CONTRAST TECHNIQUE:  Multiplanar, multiecho pulse sequences of the brain and surrounding structures were obtained without and with intravenous contrast. Angiographic images of the head were obtained using MRA technique without contrast. CONTRAST:  6 cc Gadavist COMPARISON:  CT same day.  MRI 05/22/2017 FINDINGS: MRI HEAD FINDINGS Brain: Diffusion imaging shows a 4 cm region of acute infarction at the inferior cerebellum on the left consistent with left posterior inferior cerebellar artery branch vessel occlusion. Mild swelling but no hemorrhage or mass effect. Additionally, there are multiple clustered foci of acute infarction within the deep white matter on the right within the radiating white matter tracts most consistent with hypoperfusion or watershed infarctions. Likewise, there is mild swelling but no hemorrhage or mass effect. There are no acute cortically based infarctions affecting the supratentorial brain. Background pattern of chronic small vessel ischemic change affects the pons, basal ganglia and the cerebral hemispheric white matter. No hydrocephalus. No extra-axial collection. No sign of mass lesion. After contrast administration, no abnormal  enhancement occurs. Vascular: Major vessels at the base of the brain show flow. See below. Skull and upper cervical spine: Negative Sinuses/Orbits: Ordinary mucosal inflammatory changes of the maxillary sinuses. Orbits negative. Other: None MRA HEAD FINDINGS Both internal carotid arteries are patent through the skull base. On the left, the anterior and middle cerebral vessels show flow, similar to the previous study. On the right, there is a new severe stenosis/subtotal occlusion of the supraclinoid ICA/proximal MCA. Left vertebral artery remains patent to the basilar. Flow is visible within the left posterior inferior cerebellar artery. There is motion degradation on today's exam which precludes more distal evaluation. The right vertebral artery is a disease vessel which does not show flow to the basilar on today's study. This could be in part technical. Atherosclerotic irregularity of the basilar artery. Distal posterior circulation branch vessels are poorly seen. IMPRESSION: Acute infarction within the left posterior inferior cerebellar artery territory. Mild swelling but no hemorrhage or mass effect. Flow persists within the left vertebral artery and at least to some extent within the left posterior inferior cerebellar artery. Multiple acute infarctions within the deep white matter/radiating white matter tracts of the right cerebral hemisphere, most consistent with watershed or hypoperfusion infarctions. Mild swelling but no hemorrhage or mass effect. 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 The intracranial MR angiogram is motion degraded compared to previous studies. Both anterior cerebral arteries receive there supply from the left carotid circulation. No change seen on the left in the anterior circulation. The right supraclinoid ICA/proximal MCA stenosis is new. These results were called by telephone at the time of interpretation on 12/23/2017 at 10:15  am to Dr. [name] , who verbally acknowledged these results. Electronically Signed   By: Nelson Chimes M.D.   On: 12/23/2017 10:15   US Carotid Bilateral (at [facility] And [facility] Only)  Result Date: 12/23/2017 CLINICAL DATA:  Stroke.  History of heart murmur. EXAM: BILATERAL CAROTID DUPLEX ULTRASOUND TECHNIQUE: Pearline Cables scale imaging, color Doppler and duplex ultrasound were performed of bilateral carotid and vertebral arteries in the neck. COMPARISON:  None. FINDINGS: Criteria: Quantification of carotid stenosis is based on velocity parameters that correlate the residual internal carotid diameter with NASCET-based stenosis levels, using the diameter of the distal internal carotid lumen as the denominator for stenosis measurement. The following velocity measurements were obtained: RIGHT ICA:  203/66 cm/sec CCA:  41/66 cm/sec SYSTOLIC ICA/CCA RATIO:  3.8 ECA:  200 cm/sec LEFT ICA:  94/30 cm/sec CCA:  69/45 cm/sec SYSTOLIC ICA/CCA RATIO:  1.2 ECA:  65 cm/sec RIGHT CAROTID ARTERY: There is a large amount of eccentric echogenic plaque within the right carotid bulb (image 24 and 27), which results in elevated peak systolic velocities within the proximal aspect of the right internal carotid artery. Greatest acquired peak systolic velocity within the proximal right ICA measures 203 centimeters/second (image 47). RIGHT VERTEBRAL ARTERY:  Antegrade Flow LEFT CAROTID ARTERY: There is a moderate amount of eccentric mixed echogenic plaque within the left carotid bulb (image 71 and 74), not resulting in elevated peak systolic velocities within the interrogated course of the left internal carotid artery to suggest a hemodynamically significant stenosis. LEFT VERTEBRAL ARTERY:  Antegrade Flow IMPRESSION: 1. Large amount of right-sided atherosclerotic plaque results in elevated peak systolic velocities within the right internal carotid artery compatible with the higher end of the 50-69% luminal narrowing range. Further evaluation with  CTA could performed as clinically indicated. 2. Moderate amount of left-sided atherosclerotic plaque, not resulting in a hemodynamically significant stenosis. Electronically Signed   By: Sandi Mariscal M.D.   On: 12/23/2017 15:06    Cardiac Studies   Echocardiogram 12/24/2017: Study Conclusions  - Left ventricle: The cavity size was normal. Wall thickness was   increased in a pattern of mild LVH. Systolic function was mildly   reduced. The estimated ejection fraction was in the range of 45%   to 50%. There is hypokinesis of the mid-apicalanteroseptal and   apical myocardium. Features are consistent with a pseudonormal   left ventricular filling pattern, with concomitant abnormal   relaxation and increased filling pressure (grade 2 diastolic   dysfunction). - Aortic valve: Mildly to moderately calcified annulus. Probably   trileaflet; mildly calcified leaflets. - Mitral valve: Mildly calcified annulus. There was mild   regurgitation. - Right atrium: Central venous pressure (est): 3 mm Hg. - Atrial septum: No defect or patent foramen ovale was identified. - Tricuspid valve: There was trivial regurgitation. - Pulmonary arteries: PA peak pressure: 43 mm Hg (S). - Pericardium, extracardiac: There was no pericardial effusion.  Patient Profile     67 y.o. female with a history of PSVT, hypertension, type 2 diabetes mellitus, hepatitis C, hyperlipidemia, carotid artery disease and recurring cocaine abuse, now presenting with abnormal ECG and troponin I levels in the setting of recent cocaine use, also newly documented stroke.  Assessment & Plan    1.  Cardiomyopathy with LVEF 45 to 50% and anteroseptal/apical wall motion abnormalities in the setting of recent abnormal ECG with deep anterolateral T wave inversions following cocaine use.  Troponin I levels are only minimally increased at 0.08 and 0.07.  2. 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  3.  Carotid artery disease, 50 to 69% RICA stenosis and nonobstructive LICA disease.  4.  Recurring cocaine abuse.  5.  HIstory of PSVT, no recent recurrences.  6.  Essential hypertension.  Continue medical therapy from a cardiac perspective.  She has been placed back on amiodarone, also on aspirin and Plavix.  Plan to resume Coreg at lower dose (since alpha and beta-blocker less of a worry after cocaine use), avoid aggressive lowering of blood pressure with recent stroke.  I do not anticipate invasive cardiac evaluation.  Signed, Rozann Lesches, MD  12/25/2017, 9:02 AM

## 2017-12-25 NOTE — Progress Notes (Signed)
Pt again attempting to get out of bed, all leads off and refusing to stay in bed. Contacted e-link doctor for advice. Dr. Emmit Alexanders suggested 1 mg versed. Will administer and monitor patient.

## 2017-12-25 NOTE — Telephone Encounter (Signed)
Order placed. Will mail monitor to pt's home.

## 2017-12-25 NOTE — Progress Notes (Signed)
Patient too agitated to use CPAP at this time and is in restraints.

## 2017-12-25 NOTE — Progress Notes (Signed)
eLink Physician-Brief Progress Note Patient Name: MIYO AINA DOB: Dec 18, 1950 MRN: [id number]   Date of Service  12/25/2017  HPI/Events of Note  Delirium - Blood glucose = 141. QTc interval = 0.49. Nurses noted QTc prolongation after Haldol earlier. Therefore, reluctant to use a Precedex IV infusion which would have been my first choice.   eICU Interventions  Will order: 1. Versed 1 mg IV now (extra dose)/     Intervention Category Major Interventions: Delirium, psychosis, severe agitation - evaluation and management  Alissa Pharr Eugene 12/25/2017, 3:35 AM

## 2017-12-25 NOTE — Progress Notes (Signed)
Pt continuing to pull leads off, oxygen off, trying to get out of the bed and yelling. Dr Olevia Bowens on the floor again for this episode. Ordered 65m Ativan

## 2017-12-25 NOTE — Progress Notes (Signed)
In the process of pulling ativan, patient managed to pull all of her leads off, her gown off and was up out of bed walking towards the couch stating she "had to use the restroom." Got patient onto bedside commode, back in bed and administered the prescribed ativan.

## 2017-12-25 NOTE — Progress Notes (Signed)
PROGRESS NOTE  Cassandra Jordan  EXN:[id number]  DOB: 04-Jul-1950  DOA: 12/23/2017 PCP: [facility]   Brief Admission Hx: 67 y.o. female chronic cocaine abuser, smoker, diabetic, with known cerebrovascular and heart disease presented to ED with complaints of a frontal headache and acute CVA.    MDM/Assessment & Plan:   1. Acute CVA - MRI reveals multiple acute infarcts in deep white matter and along the left PICA territory.  Pt is on CVA treatment protocol.  Aspirin for antiplatelet therapy.  CVA workup ongoing. Neurology will see her today.  I confirmed with Dr. Merlene Laughter.  2. 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  Will try to arrange for outpatient vascular surgery consultation when medically stabilized.  Neurology has recommended 30 day event monitor and I have called [place] office to arrange.   3. Dyslipidemia - LDL of 181 - continue atorvastatin 80 mg daily. 4. Prolonged QTc - monitoring on telemetry.  Magnesium IV given this morning.   5. Acute alcohol withdrawal - Pt on CIWA protocol, safety restraints and sitter ordered for safety.  Avoiding precedex due to markedly prolonged QTc.   6. Essential hypertension - allowing some permissive hypertension in the setting of acute CVA.  Plan to gradually lower BP over next several days.  Per neurology, avoid abrupt and rapid drop in blood pressure.   7. PSVT - cardiology has restarted patient on her home amiodarone and carvedilol.   8. Type 2 DM - continue carb modified diet, follow CBGs. A1c is 6.7% which is at ADA goal.  9. Cocaine abuse - Social worker consulted to discuss treatment options with patient.    DVT prophylaxis: lovenox Code Status: Full  Family Communication: none  Disposition Plan: SNF recommended  Consultants:  Neurology  cardiology  Antimicrobials:    Subjective: Pt having acute  withdrawal from alcohol and having severe delirium and agitation.      Objective: Vitals:   12/25/17 0600 12/25/17 0640 12/25/17 0700 12/25/17 0941  BP: (!) 142/126 (!) 144/130 (!) 183/115 (!) 210/140  Pulse: 93  (!) 106   Resp: (!) 31  (!) 21   Temp:   98.1 F (36.7 C)   TempSrc:   Axillary   SpO2: 100%  100%   Weight:      Height:        Intake/Output Summary (Last 24 hours) at 12/25/2017 1101 Last data filed at 12/24/2017 2000 Gross per 24 hour  Intake 448.31 ml  Output 2 ml  Net 446.31 ml   Filed Weights   12/23/17 0641 12/23/17 1656 12/24/17 0455  Weight: 66.2 kg 65 kg 66.3 kg   REVIEW OF SYSTEMS  As per history otherwise all reviewed and reported negative  Exam:  General exam: Pt is confused and disoriented, writhing in bed trying to get out of bed.   Respiratory system:  Shallow breathing bilateral, No increased work of breathing. Cardiovascular system: S1 & S2 heard. No JVD, murmurs, gallops, clicks or pedal edema. Gastrointestinal system: Abdomen is nondistended, soft and nontender. Normal bowel sounds heard. Central nervous system:  Moving all extremities.  Extremities: no CCE.  Data Reviewed: Basic Metabolic Panel: Recent Labs  Lab 12/23/17 0755 12/23/17 0757 12/24/17 0401  NA 140 140 139  K 2.8* 3.0* 4.6  CL 99 99 110  CO2 31  --  22  GLUCOSE 128* 126* 127*  BUN 22 23 23  CREATININE 1.48* 1.40* 1.62*  CALCIUM 8.8*  --  8.0*  MG 1.9  --  1.8   Liver Function Tests: Recent Labs  Lab 12/24/17 0401  AST 24  ALT 11  ALKPHOS 41  BILITOT 0.5  PROT 6.6  ALBUMIN 2.4*   No results for input(s): LIPASE, AMYLASE in the last 168 hours. No results for input(s): AMMONIA in the last 168 hours. CBC: Recent Labs  Lab 12/23/17 0755 12/23/17 0757  WBC 11.2*  --   NEUTROABS 6.8  --   HGB 12.7 13.9  HCT 41.3 41.0  MCV 89.0  --   PLT 375  --    Cardiac Enzymes: Recent Labs  Lab 12/23/17 1211  TROPONINI 0.07*   CBG (last 3)  Recent Labs     12/24/17 1742 12/25/17 0328 12/25/17 0750  GLUCAP 110* 141* 143*   Recent Results (from the past 240 hour(s))  MRSA PCR Screening     Status: None   Collection Time: 12/23/17 12:20 PM  Result Value Ref Range Status   MRSA by PCR NEGATIVE NEGATIVE Final    Comment:        The GeneXpert MRSA Assay (FDA approved for NASAL specimens only), is one component of a comprehensive MRSA colonization surveillance program. It is not intended to diagnose MRSA infection nor to guide or monitor treatment for MRSA infections. Performed at [facility], [address], [place], [place] [address]      Studies: US Carotid Bilateral (at [facility] And Ap Only)  Result Date: 12/23/2017 CLINICAL DATA:  Stroke.  History of heart murmur. EXAM: BILATERAL CAROTID DUPLEX ULTRASOUND TECHNIQUE: Pearline Cables scale imaging, color Doppler and duplex ultrasound were performed of bilateral carotid and vertebral arteries in the neck. COMPARISON:  None. FINDINGS: Criteria: Quantification of carotid stenosis is based on velocity parameters that correlate the residual internal carotid diameter with NASCET-based stenosis levels, using the diameter of the distal internal carotid lumen as the denominator for stenosis measurement. The following velocity measurements were obtained: RIGHT ICA:  203/66 cm/sec CCA:  95/28 cm/sec SYSTOLIC ICA/CCA RATIO:  3.8 ECA:  200 cm/sec LEFT ICA:  94/30 cm/sec CCA:  41/32 cm/sec SYSTOLIC ICA/CCA RATIO:  1.2 ECA:  65 cm/sec RIGHT CAROTID ARTERY: There is a large amount of eccentric echogenic plaque within the right carotid bulb (image 24 and 27), which results in elevated peak systolic velocities within the proximal aspect of the right internal carotid artery. Greatest acquired peak systolic velocity within the proximal right ICA measures 203 centimeters/second (image 47). RIGHT VERTEBRAL ARTERY:  Antegrade Flow LEFT CAROTID ARTERY: There is a moderate amount of eccentric mixed echogenic plaque within the left  carotid bulb (image 71 and 74), not resulting in elevated peak systolic velocities within the interrogated course of the left internal carotid artery to suggest a hemodynamically significant stenosis. LEFT VERTEBRAL ARTERY:  Antegrade Flow IMPRESSION: 1. Large amount of right-sided atherosclerotic plaque results in elevated peak systolic velocities within the right internal carotid artery compatible with the higher end of the 50-69% luminal narrowing range. Further evaluation with CTA could performed as clinically indicated. 2. Moderate amount of left-sided atherosclerotic plaque, not resulting in a hemodynamically significant stenosis. Electronically Signed   By: Sandi Mariscal M.D.   On: 12/23/2017 15:06   Scheduled Meds: . amiodarone  100 mg Oral Daily  . aspirin  325 mg Oral Daily   Or  . aspirin  300 mg Rectal Daily  . atorvastatin  80 mg Oral q1800  . carvedilol  3.125 mg Oral BID WC  . cloNIDine  0.1 mg Oral BID  . clopidogrel  75 mg Oral Q breakfast  . clotrimazole  1 Applicatorful Vaginal QHS  . enoxaparin (LOVENOX) injection  30 mg Subcutaneous Q24H  . folic acid  1 mg Oral Daily  . Influenza vac split quadrivalent PF  0.5 mL Intramuscular Tomorrow-1000  . insulin aspart  0-9 Units Subcutaneous TID WC  . mouth rinse  15 mL Mouth Rinse BID  . multivitamin with minerals  1 tablet Oral Daily  . thiamine  100 mg Oral Daily   Or  . thiamine  100 mg Intravenous Daily   Continuous Infusions: . sodium chloride      Principal Problem:   Acute CVA (cerebrovascular accident) ([place]) Active Problems:   Essential hypertension   PSVT (paroxysmal supraventricular tachycardia) ([facility])   Chest pain   Diabetes mellitus, type II ([facility]CC)   Tobacco abuse   Cocaine abuse (HCC)   Generalized weakness   Hepatic cirrhosis (HCC)   GERD (gastroesophageal reflux disease)   Leukocytosis   Hypomagnesemia   Uncontrolled type 2 diabetes mellitus with hyperglycemia, with long-term current use of insulin  (HCC)   CKD (chronic kidney disease), stage III (HCC)   Abnormal EKG   Cerebellar stroke [facility])   Critical Care Time spent: 35 minutes  Irwin Brakeman, MD, FAAFP [facility] Pager [phone]  If 7PM-7AM, please contact night-coverage [email or web address] Password [email or web address] 12/25/2017, 11:01 AM    LOS: 2 days

## 2017-12-25 NOTE — Progress Notes (Signed)
Pt has been combative all morning, spitting, hollering, and trying to hit and getting out of bed I gave 4m of ativan twice with no results. Dr. JWynetta Emerywas paged concerning the need for a precedex drip. I did 12 lead EKG and pt's QT interval was still too prolonged to start gtt. Dr. JWynetta Emerygave me a verbal order for ankle and wrist soft restraints (4 total) and a safety sitter due to pt not being able to follow safety commands and being combative. Will implement and continue to monitor pt.

## 2017-12-25 NOTE — Progress Notes (Signed)
SLP Cancellation Note  Patient Details Name: MATISYN CABEZA MRN: [id number] DOB: 1950/12/05   Cancelled treatment:        Pt is not appropriate for SLE this date secondary to mentation/DTs. ST will continue to follow and re-attempt when Pt is able to participate. Thank you,  Tyliyah Mcmeekin H. Roddie Mc, [place] Speech Language Pathologist    Wende Bushy 12/25/2017, 1:10 PM

## 2017-12-25 NOTE — Progress Notes (Signed)
Pt very anxious and agitated, confused and repeatedly trying to get out of bed. Administered PRN ativan at 2242 for CIWA score of 15. There was no relief. Requested additional PRN medication to keep patient safe. Haldol was ordered for one time dose of 2.5 mg. QT was 0.42. Pt remained agitated and continuing to get out of bed. Dr. Olevia Bowens was on unit to round. QT interval had since elongated. Received orders for Mag Sulfate 2g, ativan 2 mg and lopressor 5 mg. Pt is still trying to get out of bed but is sleeping intermittently.

## 2017-12-25 NOTE — Progress Notes (Signed)
OT Cancellation Note  Patient Details Name: Cassandra Jordan MRN: [id number] DOB: 25-Jul-1950   Cancelled Treatment:    Reason Eval/Treat Not Completed: Patient's level of consciousness. Patient unable to participate in OT evaluation fully due to mentality. Will re-attempt OT evaluation when patient is able to participate.     Ailene Ravel, OTR/L,CBIS  [phone]  12/25/2017, 1:26 PM

## 2017-12-25 NOTE — Progress Notes (Signed)
PT Cancellation Note  Patient Details Name: Cassandra Jordan MRN: [id number] DOB: Oct 09, 1950   Cancelled Treatment:    Reason Eval/Treat Not Completed: Fatigue/lethargy limiting ability to participate.  Patient held per RN request due to lethargy and confusion.  Will check back tomorrow.     3:33 PM, 12/25/17 Lonell Grandchild, MPT Physical Therapist with [facility] [phone] office [phone] mobile phone

## 2017-12-26 ENCOUNTER — Inpatient Hospital Stay (HOSPITAL_COMMUNITY): Payer: Medicare Other

## 2017-12-26 LAB — COMPREHENSIVE METABOLIC PANEL
ALT: 19 U/L (ref 0–44)
AST: 60 U/L — AB (ref 15–41)
Albumin: 2.6 g/dL — ABNORMAL LOW (ref 3.5–5.0)
Alkaline Phosphatase: 41 U/L (ref 38–126)
Anion gap: 7 (ref 5–15)
BUN: 24 mg/dL — ABNORMAL HIGH (ref 8–23)
CO2: 22 mmol/L (ref 22–32)
Calcium: 8.4 mg/dL — ABNORMAL LOW (ref 8.9–10.3)
Chloride: 110 mmol/L (ref 98–111)
Creatinine, Ser: 1.66 mg/dL — ABNORMAL HIGH (ref 0.44–1.00)
GFR calc Af Amer: 37 mL/min — ABNORMAL LOW (ref >60)
GFR calc non Af Amer: 32 mL/min — ABNORMAL LOW (ref >60)
GLUCOSE: 135 mg/dL — AB (ref 70–99)
Potassium: 4.8 mmol/L (ref 3.5–5.1)
Sodium: 139 mmol/L (ref 135–145)
Total Bilirubin: 0.6 mg/dL (ref 0.3–1.2)
Total Protein: 7 g/dL (ref 6.5–8.1)

## 2017-12-26 LAB — BLOOD GAS, ARTERIAL
Acid-base deficit: 3.8 mmol/L — ABNORMAL HIGH (ref 0.0–2.0)
Acid-base deficit: 3.5 mmol/L — ABNORMAL HIGH (ref 0.0–2.0)
Bicarbonate: 20.9 mmol/L (ref 20.0–28.0)
Bicarbonate: 21.3 mmol/L (ref 20.0–28.0)
DELIVERY SYSTEMS: POSITIVE
DRAWN BY: 382351
Delivery systems: POSITIVE
Drawn by: 270161
Expiratory PAP: 8
Expiratory PAP: 8
O2 Content: 6 L/min
O2 Content: 6 L/min
O2 Saturation: 98.3 %
O2 Saturation: 98.2 %
PH ART: 7.33 — AB (ref 7.350–7.450)
Patient temperature: 37
Patient temperature: 37
pCO2 arterial: 45.3 mmHg (ref 32.0–48.0)
pCO2 arterial: 41.7 mmHg (ref 32.0–48.0)
pH, Arterial: 7.3 — ABNORMAL LOW (ref 7.350–7.450)
pO2, Arterial: 131 mmHg — ABNORMAL HIGH (ref 83.0–108.0)
pO2, Arterial: 116 mmHg — ABNORMAL HIGH (ref 83.0–108.0)

## 2017-12-26 LAB — CBC WITH DIFFERENTIAL/PLATELET
Abs Immature Granulocytes: 0.16 10*3/uL — ABNORMAL HIGH (ref 0.00–0.07)
Basophils Absolute: 0.1 10*3/uL (ref 0.0–0.1)
Basophils Relative: 0 %
Eosinophils Absolute: 0 10*3/uL (ref 0.0–0.5)
Eosinophils Relative: 0 %
HEMATOCRIT: 34.9 % — AB (ref 36.0–46.0)
Hemoglobin: 10.5 g/dL — ABNORMAL LOW (ref 12.0–15.0)
Immature Granulocytes: 1 %
LYMPHS ABS: 1.8 10*3/uL (ref 0.7–4.0)
LYMPHS PCT: 14 %
MCH: 28.2 pg (ref 26.0–34.0)
MCHC: 30.1 g/dL (ref 30.0–36.0)
MCV: 93.6 fL (ref 80.0–100.0)
Monocytes Absolute: 0.9 10*3/uL (ref 0.1–1.0)
Monocytes Relative: 7 %
Neutro Abs: 9.9 10*3/uL — ABNORMAL HIGH (ref 1.7–7.7)
Neutrophils Relative %: 78 %
Platelets: 342 10*3/uL (ref 150–400)
RBC: 3.73 MIL/uL — ABNORMAL LOW (ref 3.87–5.11)
RDW: 13.5 % (ref 11.5–15.5)
WBC: 12.8 10*3/uL — ABNORMAL HIGH (ref 4.0–10.5)
nRBC: 0 % (ref 0.0–0.2)

## 2017-12-26 LAB — GLUCOSE, CAPILLARY
Glucose-Capillary: 115 mg/dL — ABNORMAL HIGH (ref 70–99)
Glucose-Capillary: 132 mg/dL — ABNORMAL HIGH (ref 70–99)
Glucose-Capillary: 98 mg/dL (ref 70–99)
Glucose-Capillary: 113 mg/dL — ABNORMAL HIGH (ref 70–99)

## 2017-12-26 LAB — MAGNESIUM: Magnesium: 2.3 mg/dL (ref 1.7–2.4)

## 2017-12-26 MED ORDER — CARVEDILOL 3.125 MG PO TABS
6.2500 mg | ORAL_TABLET | Freq: Two times a day (BID) | ORAL | Status: DC
  Administered 2018-01-03 – 2018-01-10 (×11): 6.25 mg via ORAL
  Filled 2017-12-26 (×16): qty 2

## 2017-12-26 MED ORDER — HYDRALAZINE HCL 20 MG/ML IJ SOLN
10.0000 mg | INTRAMUSCULAR | Status: DC | PRN
  Administered 2017-12-27 – 2018-01-04 (×16): 10 mg via INTRAVENOUS
  Filled 2017-12-26 (×16): qty 1

## 2017-12-26 MED ORDER — LORAZEPAM 2 MG/ML IJ SOLN: 3.0000 mg | INTRAMUSCULAR | Status: DC | PRN

## 2017-12-26 MED ORDER — INSULIN ASPART 100 UNIT/ML ~~LOC~~ SOLN
0.0000 [IU] | SUBCUTANEOUS | Status: DC
  Administered 2017-12-26: 1 [IU] via SUBCUTANEOUS
  Administered 2017-12-31: 2 [IU] via SUBCUTANEOUS
  Administered 2017-12-31 – 2018-01-02 (×7): 1 [IU] via SUBCUTANEOUS

## 2017-12-26 MED ORDER — ENOXAPARIN SODIUM 30 MG/0.3ML ~~LOC~~ SOLN
30.0000 mg | SUBCUTANEOUS | Status: DC
  Administered 2017-12-26 – 2017-12-27 (×2): 30 mg via SUBCUTANEOUS
  Filled 2017-12-26 (×2): qty 0.3

## 2017-12-26 MED ORDER — IPRATROPIUM-ALBUTEROL 0.5-2.5 (3) MG/3ML IN SOLN
3.0000 mL | Freq: Once | RESPIRATORY_TRACT | Status: AC
  Administered 2017-12-26: 3 mL via RESPIRATORY_TRACT
  Filled 2017-12-26: qty 3

## 2017-12-26 MED ORDER — LORAZEPAM 2 MG/ML IJ SOLN
1.0000 mg | INTRAMUSCULAR | Status: DC | PRN
  Administered 2017-12-26 – 2017-12-30 (×8): 2 mg via INTRAVENOUS
  Administered 2017-12-30: 1 mg via INTRAVENOUS
  Filled 2017-12-26 (×10): qty 1

## 2017-12-26 MED ORDER — FUROSEMIDE 10 MG/ML IJ SOLN: 40.0000 mg | Freq: Once | INTRAMUSCULAR | Status: DC

## 2017-12-26 MED ORDER — SODIUM CHLORIDE 0.9 % IV SOLN
1.5000 g | Freq: Two times a day (BID) | INTRAVENOUS | Status: DC
  Administered 2017-12-26 – 2017-12-29 (×6): 1.5 g via INTRAVENOUS
  Filled 2017-12-26 (×10): qty 1.5

## 2017-12-26 MED ORDER — SODIUM CHLORIDE 0.9 % IV SOLN
1.5000 g | Freq: Four times a day (QID) | INTRAVENOUS | Status: DC
  Filled 2017-12-26 (×2): qty 1.5

## 2017-12-26 NOTE — Progress Notes (Signed)
PHARMACY NOTE:  ANTIMICROBIAL RENAL DOSAGE ADJUSTMENT  Current antimicrobial regimen includes a mismatch between antimicrobial dosage and estimated renal function.  As per policy approved by the Pharmacy & Therapeutics and Medical Executive Committees, the antimicrobial dosage will be adjusted accordingly.  Current antimicrobial dosage:  unasyn 1.5 grams Q6 hours  Indication: aspiration PNA  Renal Function:  Estimated Creatinine Clearance: 28.5 mL/min (A) (by C-G formula based on SCr of 1.66 mg/dL (H)). []      On intermittent HD, scheduled: []      On CRRT    Antimicrobial dosage has been changed to:  unasyn 1.5 grams Q12  Additional comments:   Thank you for allowing pharmacy to be a part of this patient's care.  Nyra Capes, [facility] 12/26/2017 7:55 AM

## 2017-12-26 NOTE — Progress Notes (Signed)
PROGRESS NOTE  Cassandra Jordan  TKW:[id number]  DOB: 02-15-1950  DOA: 12/23/2017 PCP: [facility]   Brief Admission Hx: 67 y.o. female chronic cocaine abuser, smoker, diabetic, with known cerebrovascular and heart disease presented to ED with complaints of a frontal headache and acute CVA.    MDM/Assessment & Plan:   1. Acute CVA - MRI reveals multiple acute infarcts in deep white matter and along the left PICA territory.  Pt is on CVA treatment protocol.  Neuro recommends dual antiplatelet therapy.  Avoid abrupt and rapid drop in blood pressure.  2. 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  Will try to arrange for outpatient vascular surgery consultation when medically stabilized.  Neurology has recommended 30 day event monitor and I have called [place] office to arrange.   3. Dyslipidemia - LDL of 181 - continue atorvastatin 80 mg daily. 4. Prolonged QTc - monitoring on telemetry.  Monitor lytes. Continuous telemetry. 5. Aspiration pneumonia - IV Unasyn started.  NPO for now.   6. Acute respiratory distress - check ABG, I asked Dr. Luan Pulling to see, Bipap at bedside.  7. Acute alcohol withdrawal - Pt on CIWA protocol, safety restraints and sitter ordered for safety.  Avoiding precedex due to markedly prolonged QTc.   8. Essential hypertension - allowing  permissive hypertension in the setting of acute CVA.  Plan to gradually lower BP over next several days.  Per neurology, avoid abrupt and rapid drop in blood pressure.   9. PSVT - cardiology has restarted patient on her home amiodarone and carvedilol.   10. Type 2 DM - CBG Q4h while NPO plus SSI coverage. A1c is 6.7% which is at ADA goal.  11. Cocaine abuse - Social worker consulted to discuss treatment options with patient.    DVT prophylaxis: lovenox Code Status: Full  Family Communication: none    Disposition Plan: SNF recommended  Consultants:  Neurology  cardiology  Antimicrobials:  Unasyn 12/5  Subjective: Pt very lethargic this morning.      Objective: Vitals:   12/26/17 0400 12/26/17 0500 12/26/17 0600 12/26/17 0700  BP: 108/68 101/61 101/68 121/69  Pulse: 77 74 76 78  Resp: 18 18 19  (!) 23  Temp:      TempSrc:      SpO2: 99% 99% 96% 97%  Weight:      Height:        Intake/Output Summary (Last 24 hours) at 12/26/2017 0811 Last data filed at 12/26/2017 0700 Gross per 24 hour  Intake 891.61 ml  Output 200 ml  Net 691.61 ml   Filed Weights   12/23/17 1656 12/24/17 0455 12/26/17 0328  Weight: 65 kg 66.3 kg 67.1 kg   REVIEW OF SYSTEMS  UTO due to encephalopathy  Exam:  General exam: Pt is lethargic but arouses to noxious stimuli.  Respiratory system:  Coarse airway noises and bilateral rales, Moderate increased work of breathing. Cardiovascular system: S1 & S2 heard. No JVD, murmurs, gallops, clicks or pedal edema. Gastrointestinal system: Abdomen is nondistended, soft and nontender. Normal bowel sounds heard. Central nervous system:  Moving all extremities.  Extremities: no cyanosis and no pretibial edema.   Data Reviewed: Basic Metabolic Panel: Recent Labs  Lab 12/23/17 0755 12/23/17 0757 12/24/17 0401 12/25/17 1236 12/26/17 0423  NA 140 140 139 138 139  K 2.8* 3.0* 4.6 4.1 4.8  CL 99 99 110 108 110  CO2  31  --  22 21* 22  GLUCOSE 128* 126* 127* 120* 135*  BUN 22 23 23 20  24*  CREATININE 1.48* 1.40* 1.62* 1.31* 1.66*  CALCIUM 8.8*  --  8.0* 8.7* 8.4*  MG 1.9  --  1.8 2.3 2.3   Liver Function Tests: Recent Labs  Lab 12/24/17 0401 12/25/17 1236 12/26/17 0423  AST 24 36 60*  ALT 11 14 19   ALKPHOS 41 47 41  BILITOT 0.5 0.6 0.6  PROT 6.6 7.5 7.0  ALBUMIN 2.4* 2.8* 2.6*   No results for input(s): LIPASE, AMYLASE in the last 168 hours. No results for input(s): AMMONIA in the last 168 hours. CBC: Recent Labs  Lab 12/23/17 0755  12/23/17 0757 12/25/17 1236 12/26/17 0423  WBC 11.2*  --  14.6* 12.8*  NEUTROABS 6.8  --  10.3* 9.9*  HGB 12.7 13.9 11.4* 10.5*  HCT 41.3 41.0 36.4 34.9*  MCV 89.0  --  90.3 93.6  PLT 375  --  375 342   Cardiac Enzymes: Recent Labs  Lab 12/23/17 1211  TROPONINI 0.07*   CBG (last 3)  Recent Labs    12/25/17 1720 12/25/17 2313 12/26/17 0808  GLUCAP 90 98 115*   Recent Results (from the past 240 hour(s))  MRSA PCR Screening     Status: None   Collection Time: 12/23/17 12:20 PM  Result Value Ref Range Status   MRSA by PCR NEGATIVE NEGATIVE Final    Comment:        The GeneXpert MRSA Assay (FDA approved for NASAL specimens only), is one component of a comprehensive MRSA colonization surveillance program. It is not intended to diagnose MRSA infection nor to guide or monitor treatment for MRSA infections. Performed at [facility], [address] [place], [place] [address]      Studies: Dg Chest [facility] 1 View  Result Date: 12/26/2017 CLINICAL DATA:  Aspiration. EXAM: PORTABLE CHEST 1 VIEW COMPARISON:  12/23/2017. FINDINGS: Heart size stable given AP technique. No pulmonary venous congestion. Low lung volumes. Bibasilar pulmonary infiltrates/edema. Tiny bilateral pleural effusions cannot be excluded. No pneumothorax. No acute bony abnormality. IMPRESSION: Low lung volumes with bibasilar infiltrates/edema. Tiny pleural effusions cannot be excluded. Electronically Signed   By: [place]   On: 12/26/2017 07:47   Scheduled Meds: . amiodarone  100 mg Oral Daily  . aspirin  325 mg Oral Daily   Or  . aspirin  300 mg Rectal Daily  . atorvastatin  80 mg Oral q1800  . carvedilol  3.125 mg Oral BID WC  . cloNIDine  0.1 mg Oral BID  . clopidogrel  75 mg Oral Q breakfast  . clotrimazole  1 Applicatorful Vaginal QHS  . enoxaparin (LOVENOX) injection  40 mg Subcutaneous Q24H  . folic acid  1 mg Oral Daily  . furosemide  40 mg Intravenous Once  . Influenza vac split  quadrivalent PF  0.5 mL Intramuscular Tomorrow-1000  . insulin aspart  0-9 Units Subcutaneous Q4H  . ipratropium-albuterol  3 mL Nebulization Once  . mouth rinse  15 mL Mouth Rinse BID  . multivitamin with minerals  1 tablet Oral Daily  . thiamine  100 mg Oral Daily   Or  . thiamine  100 mg Intravenous Daily   Continuous Infusions: . sodium chloride 50 mL/hr at 12/26/17 0538  . ampicillin-sulbactam (UNASYN) IV      Principal Problem:   Acute CVA (cerebrovascular accident) [facility]) Active Problems:   Essential hypertension   PSVT (paroxysmal supraventricular tachycardia) (  [place])   Chest pain   Diabetes mellitus, type II (HCC)   Tobacco abuse   Cocaine abuse (HCC)   Generalized weakness   Hepatic cirrhosis (HCC)   GERD (gastroesophageal reflux disease)   Leukocytosis   Hypomagnesemia   Uncontrolled type 2 diabetes mellitus with hyperglycemia, with long-term current use of insulin (HCC)   CKD (chronic kidney disease), stage III (HCC)   Abnormal EKG   Cerebellar stroke [facility])  Critical Care Time spent: 35 minutes  Irwin Brakeman, MD, FAAFP [facility] Pager [phone]  If 7PM-7AM, please contact night-coverage [email or web address] Password [email or web address] 12/26/2017, 8:11 AM    LOS: 3 days

## 2017-12-26 NOTE — Progress Notes (Signed)
OT Cancellation Note  Patient Details Name: Cassandra Jordan MRN: [id number] DOB: December 05, 1950   Cancelled Treatment:    Reason Eval/Treat Not Completed: Patient's level of consciousness. Attempted to see pt x2 earlier this am, first attempt pt sleeping soundly and unable to participate in OT evaluation fully due to mentality. Second attempt, RN and RT in room with pt. Will re-attempt OT evaluation when patient is able to participate.  Guadelupe Sabin, OTR/L  [phone] 12/26/2017, 8:13 AM

## 2017-12-26 NOTE — Progress Notes (Signed)
NT suctioned patient at this time due to extreme rhonci. Pt had very large amounts of thick pale secretions retrieved. No complications noted. Vitals remained stable throughout procedure.

## 2017-12-26 NOTE — Progress Notes (Signed)
SLP Cancellation Note  Patient Details Name: Cassandra Jordan MRN: [id number] DOB: 1950/12/21   Cancelled treatment:       Reason Eval/Treat Not Completed: Medical issues which prohibited therapy;Fatigue/lethargy limiting ability to participate; Pt continues to be inappropriate for SLE and is now sedated and on Bi-PAP. SLP will sign off at this time. Please re-consult when/if Pt becomes appropriate.  Thank you,  Genene Churn, [place]    [place] 12/26/2017, 11:12 AM

## 2017-12-26 NOTE — Consult Note (Signed)
Consult requested by: [facility] hospital[facility], Dr. Wynetta Emery Consult requested for: Respiratory failure  HPI: This is a 67 year old who was admitted with a stroke.  She has high-grade stenosis of the intracranial and extracranial internal carotid.  QT interval is prolonged.  She is having acute alcohol withdrawal.  She has history of cocaine abuse.  She has hypertension and PSVT.  Since she is been in the hospital she has had trouble with what appears to be the alcohol withdrawal and acute mental status abnormalities she has had increasing respiratory distress she is now on BiPAP.  Chest x-ray which I have personally reviewed shows poor inspiration but no definite infiltrate.  History is from the medical record as she is not able to provide any history.  Past Medical History:  Diagnosis Date  . Alcohol abuse   . Arthritis   . Asthma   . Cocaine abuse ([place])   . Essential hypertension   . GERD (gastroesophageal reflux disease)   . Hepatitis C   . Hyperlipidemia   . Lichen sclerosus et atrophicus 10/13/2014  . Neuropathic pain   . Noncompliance   . PSVT (paroxysmal supraventricular tachycardia) ([place])   . Rash   . Renal carcinoma ([place])   . Sleep apnea   . Type 2 diabetes mellitus ([place])   . Vaginal irritation 10/01/2014  . Vaginal itching 10/01/2014  . Yeast infection of the vagina 10/01/2014     Family History  Problem Relation Age of Onset  . Colon cancer Sister 24  . Cancer Mother   . Heart attack Father   . Hypertension Daughter   . Cancer Maternal Grandfather   . Diabetes Paternal Grandmother      Social History   Socioeconomic History  . Marital status: Single    Spouse name: Not on file  . Number of children: Not on file  . Years of education: Not on file  . Highest education level: Not on file  Occupational History  . Not on file  Social Needs  . Financial resource strain: Not on file  . Food insecurity:    Worry: Not on file    Inability: Not on file  . Transportation  needs:    Medical: Not on file    Non-medical: Not on file  Tobacco Use  . Smoking status: Current Some Day Smoker    Packs/day: 0.10    Years: 40.00    Pack years: 4.00    Types: Cigarettes  . Smokeless tobacco: Never Used  Substance and Sexual Activity  . Alcohol use: Not Currently    Alcohol/week: 0.0 standard drinks    Comment: rarely  . Drug use: Yes    Types: Marijuana, Cocaine    Comment: last used yesterday  . Sexual activity: Not Currently    Birth control/protection: Surgical    Comment: hyst  Lifestyle  . Physical activity:    Days per week: Not on file    Minutes per session: Not on file  . Stress: Not on file  Relationships  . Social connections:    Talks on phone: Not on file    Gets together: Not on file    Attends religious service: Not on file    Active member of club or organization: Not on file    Attends meetings of clubs or organizations: Not on file    Relationship status: Not on file  Other Topics Concern  . Not on file  Social History Narrative  . Not on file  ROS: Unobtainable    Objective: Vital signs in last 24 hours: Temp:  [97.7 F (36.5 C)-98.4 F (36.9 C)] 97.7 F (36.5 C) (12/05 0328) Pulse Rate:  [74-102] 78 (12/05 0700) Resp:  [18-36] 23 (12/05 0700) BP: (101-213)/(60-146) 121/69 (12/05 0700) SpO2:  [95 %-100 %] 97 % (12/05 0700) Weight:  [67.1 kg] 67.1 kg (12/05 0328) Weight change:  Last BM Date: 12/24/17  Intake/Output from previous day: 12/04 0701 - 12/05 0700 In: 891.6 [I.V.:891.6] Out: 200 [Urine:200]  PHYSICAL EXAM Constitutional: She looks uncomfortable.  She is breathing about 30 times a minute.  She is on BiPAP.  Eyes: Pupils react ears nose mouth and throat: I cannot see her throat well because of the BiPAP.  Mucous membranes appear to be somewhat dry.  Cardiovascular: Heart is regular at this point I do not hear a gallop.  Respiratory: She has marked bilateral rhonchi much worse on the right.   Gastrointestinal: Her abdomen soft without masses.  Musculoskeletal: She is moving all 4 extremities but I cannot make any further assessment.  Neurological: She is poorly responsive and confused psychiatric: Unable to assess  Lab Results: Basic Metabolic Panel: Recent Labs    12/25/17 1236 12/26/17 0423  NA 138 139  K 4.1 4.8  CL 108 110  CO2 21* 22  GLUCOSE 120* 135*  BUN 20 24*  CREATININE 1.31* 1.66*  CALCIUM 8.7* 8.4*  MG 2.3 2.3   Liver Function Tests: Recent Labs    12/25/17 1236 12/26/17 0423  AST 36 60*  ALT 14 19  ALKPHOS 47 41  BILITOT 0.6 0.6  PROT 7.5 7.0  ALBUMIN 2.8* 2.6*   No results for input(s): LIPASE, AMYLASE in the last 72 hours. No results for input(s): AMMONIA in the last 72 hours. CBC: Recent Labs    12/25/17 1236 12/26/17 0423  WBC 14.6* 12.8*  NEUTROABS 10.3* 9.9*  HGB 11.4* 10.5*  HCT 36.4 34.9*  MCV 90.3 93.6  PLT 375 342   Cardiac Enzymes: Recent Labs    12/23/17 1211  TROPONINI 0.07*   BNP: No results for input(s): PROBNP in the last 72 hours. D-Dimer: No results for input(s): DDIMER in the last 72 hours. CBG: Recent Labs    12/25/17 0328 12/25/17 0750 12/25/17 1145 12/25/17 1720 12/25/17 2313 12/26/17 0808  GLUCAP 141* 143* 123* 90 98 115*   Hemoglobin A1C: No results for input(s): HGBA1C in the last 72 hours. Fasting Lipid Panel: Recent Labs    12/24/17 0401  CHOL 280*  HDL 52  LDLCALC 195*  TRIG 163*  CHOLHDL 5.4   Thyroid Function Tests: No results for input(s): TSH, T4TOTAL, FREET4, T3FREE, THYROIDAB in the last 72 hours. Anemia Panel: No results for input(s): VITAMINB12, FOLATE, FERRITIN, TIBC, IRON, RETICCTPCT in the last 72 hours. Coagulation: No results for input(s): LABPROT, INR in the last 72 hours. Urine Drug Screen: Drugs of Abuse     Component Value Date/Time   LABOPIA NONE DETECTED 12/23/2017 0757   COCAINSCRNUR POSITIVE (A) 12/23/2017 0757   LABBENZ NONE DETECTED 12/23/2017 0757    AMPHETMU NONE DETECTED 12/23/2017 0757   THCU NONE DETECTED 12/23/2017 0757   LABBARB NONE DETECTED 12/23/2017 0757    Alcohol Level: No results for input(s): ETH in the last 72 hours. Urinalysis: No results for input(s): COLORURINE, LABSPEC, PHURINE, GLUCOSEU, HGBUR, BILIRUBINUR, KETONESUR, PROTEINUR, UROBILINOGEN, NITRITE, LEUKOCYTESUR in the last 72 hours.  Invalid input(s): APPERANCEUR Misc. Labs:   ABGS: Recent Labs    12/26/17 0744  PHART 7.300*  PO2ART 131*  HCO3 20.9     MICROBIOLOGY: Recent Results (from the past 240 hour(s))  MRSA PCR Screening     Status: None   Collection Time: 12/23/17 12:20 PM  Result Value Ref Range Status   MRSA by PCR NEGATIVE NEGATIVE Final    Comment:        The GeneXpert MRSA Assay (FDA approved for NASAL specimens only), is one component of a comprehensive MRSA colonization surveillance program. It is not intended to diagnose MRSA infection nor to guide or monitor treatment for MRSA infections. Performed at [facility], [address] [place], [place] [address]     Studies/Results: Dg Chest Port 1 View  Result Date: 12/26/2017 CLINICAL DATA:  Aspiration. EXAM: PORTABLE CHEST 1 VIEW COMPARISON:  12/23/2017. FINDINGS: Heart size stable given AP technique. No pulmonary venous congestion. Low lung volumes. Bibasilar pulmonary infiltrates/edema. Tiny bilateral pleural effusions cannot be excluded. No pneumothorax. No acute bony abnormality. IMPRESSION: Low lung volumes with bibasilar infiltrates/edema. Tiny pleural effusions cannot be excluded. Electronically Signed   By: Marcello Moores  Register   On: 12/26/2017 07:47    Medications:  Prior to Admission:  Medications Prior to Admission  Medication Sig Dispense Refill Last Dose  . amiodarone (PACERONE) 100 MG tablet Take 1 tablet (100 mg total) by mouth daily. TAKE ONE TABLET BY MOUTH ONCE DAILY 90 tablet 1 12/22/2017 at 0830am  . amLODipine (NORVASC) 10 MG tablet Take 1 tablet (10 mg  total) by mouth daily. 30 tablet 1 12/18/2017  . aspirin EC 81 MG EC tablet Take 1 tablet (81 mg total) by mouth daily.   12/22/2017 at Unknown time  . atorvastatin (LIPITOR) 40 MG tablet Take 1 tablet (40 mg total) by mouth daily at 6 PM. 30 tablet 2 12/16/2017  . carvedilol (COREG) 6.25 MG tablet Take 6.25 mg by mouth 2 (two) times daily.  0 12/22/2017 at 0900pm  . cloNIDine (CATAPRES) 0.2 MG tablet Take 0.2 mg by mouth 2 (two) times daily.  0 12/22/2017 at Unknown time  . furosemide (LASIX) 20 MG tablet Take 20 mg by mouth daily.   12/22/2017 at Unknown time  . gabapentin (NEURONTIN) 600 MG tablet Take 1 tablet by mouth 3 (three) times daily.  0 12/22/2017 at Unknown time  . LANTUS 100 UNIT/ML injection Inject 9 Units into the skin daily.   6 12/17/2017 at 0800am  . lisinopril-hydrochlorothiazide (PRINZIDE,ZESTORETIC) 20-25 MG tablet Take 1 tablet by mouth 2 (two) times daily.   12/22/2017 at Unknown time  . pantoprazole (PROTONIX) 40 MG tablet Take 1 tablet (40 mg total) by mouth daily. Take 30 minutes before breakfast 90 tablet 3 12/22/2017 at Unknown time  . potassium chloride (K-DUR) 10 MEQ tablet Take 1 tablet (10 mEq total) by mouth daily. 7 tablet 0 12/16/2017   Scheduled: . amiodarone  100 mg Oral Daily  . aspirin  325 mg Oral Daily   Or  . aspirin  300 mg Rectal Daily  . atorvastatin  80 mg Oral q1800  . carvedilol  3.125 mg Oral BID WC  . cloNIDine  0.1 mg Oral BID  . clopidogrel  75 mg Oral Q breakfast  . clotrimazole  1 Applicatorful Vaginal QHS  . enoxaparin (LOVENOX) injection  40 mg Subcutaneous Q24H  . folic acid  1 mg Oral Daily  . furosemide  40 mg Intravenous Once  . Influenza vac split quadrivalent PF  0.5 mL Intramuscular Tomorrow-1000  . insulin aspart  0-9 Units Subcutaneous  Q4H  . ipratropium-albuterol  3 mL Nebulization Once  . mouth rinse  15 mL Mouth Rinse BID  . multivitamin with minerals  1 tablet Oral Daily  . thiamine  100 mg Oral Daily   Or  . thiamine  100  mg Intravenous Daily   Continuous: . sodium chloride 50 mL/hr at 12/26/17 0538  . ampicillin-sulbactam (UNASYN) IV     AGT:[id number] **OR** acetaminophen (TYLENOL) oral liquid 160 mg/5 mL **OR** acetaminophen, hydrALAZINE, LORazepam, nitroGLYCERIN, senna-docusate  Assesment: She was admitted with a stroke.  She has had complicated situation here in the hospital and has developed increasing respiratory distress requiring BiPAP.  Her exam is suggestive that she has aspirated but I do not see a definite infiltrate on chest x-ray.  She is still having some respiratory distress on BiPAP and may require intubation and mechanical ventilation.  Blood gas shows pH of 7.3 with PCO2 of 45 and PO2 of 131 on BiPAP  She is having alcohol withdrawal which is being treated but she is still altered and somewhat agitated at times  She has PSVT which is better  She has history of cirrhosis complicating her treatment  She has diabetes on sliding scale  She has chronic kidney disease and her renal function is fluctuating somewhat and looks a little worse today Principal Problem:   Acute CVA (cerebrovascular accident) ([place]) Active Problems:   Essential hypertension   PSVT (paroxysmal supraventricular tachycardia) (HCC)   Chest pain   Diabetes mellitus, type II ([place])   Tobacco abuse   Cocaine abuse ([place])   Generalized weakness   Hepatic cirrhosis ([place])   GERD (gastroesophageal reflux disease)   Leukocytosis   Hypomagnesemia   Uncontrolled type 2 diabetes mellitus with hyperglycemia, with long-term current use of insulin (HCC)   CKD (chronic kidney disease), stage III (HCC)   Abnormal EKG   Cerebellar stroke ([place])    Plan: Continue BiPAP.  If she continues to  struggle I think she is going to need to have intubation and mechanical ventilation.  Discussed with Dr. Wynetta Emery    LOS: 3 days   Akayla Brass L 12/26/2017, 8:11 AM

## 2017-12-26 NOTE — Progress Notes (Signed)
RN placed patient back on CPAP machine due to desaturation episode. RT called to room to evaluate. Patient is stable and resting comfortably on CPAP at this time. Patient is out of 4 point restraints and will continue to be closely monitored.

## 2017-12-26 NOTE — Progress Notes (Signed)
Progress Note  Patient Name: Cassandra Jordan Date of Encounter: 12/26/2017  Primary Cardiologist: Dr. Satira Sark  Subjective   Patient currently on BiPAP.  Inpatient Medications    Scheduled Meds: . amiodarone  100 mg Oral Daily  . aspirin  325 mg Oral Daily   Or  . aspirin  300 mg Rectal Daily  . atorvastatin  80 mg Oral q1800  . carvedilol  3.125 mg Oral BID WC  . cloNIDine  0.1 mg Oral BID  . clopidogrel  75 mg Oral Q breakfast  . clotrimazole  1 Applicatorful Vaginal QHS  . enoxaparin (LOVENOX) injection  40 mg Subcutaneous Q24H  . folic acid  1 mg Oral Daily  . furosemide  40 mg Intravenous Once  . Influenza vac split quadrivalent PF  0.5 mL Intramuscular Tomorrow-1000  . insulin aspart  0-9 Units Subcutaneous Q4H  . ipratropium-albuterol  3 mL Nebulization Once  . mouth rinse  15 mL Mouth Rinse BID  . multivitamin with minerals  1 tablet Oral Daily  . thiamine  100 mg Oral Daily   Or  . thiamine  100 mg Intravenous Daily   Continuous Infusions: . sodium chloride 50 mL/hr at 12/26/17 0538  . ampicillin-sulbactam (UNASYN) IV     PRN Meds: acetaminophen **OR** acetaminophen (TYLENOL) oral liquid 160 mg/5 mL **OR** acetaminophen, hydrALAZINE, LORazepam, nitroGLYCERIN, senna-docusate   Vital Signs    Vitals:   12/26/17 0500 12/26/17 0600 12/26/17 0700 12/26/17 0800  BP: 101/61 101/68 121/69   Pulse: 74 76 78   Resp: 18 19 (!) 23   Temp:    (!) 97.5 F (36.4 C)  TempSrc:    Axillary  SpO2: 99% 96% 97%   Weight:      Height:        Intake/Output Summary (Last 24 hours) at 12/26/2017 0838 Last data filed at 12/26/2017 0700 Gross per 24 hour  Intake 891.61 ml  Output 200 ml  Net 691.61 ml   Filed Weights   12/23/17 1656 12/24/17 0455 12/26/17 0328  Weight: 65 kg 66.3 kg 67.1 kg    Telemetry    Sinus rhythm.  Personally reviewed.  Physical Exam   GEN: No acute distress.   Neck: No JVD. Cardiac: RRR, no gallop.  Respiratory:  Nonlabored.  Decreased breath sounds, scattered rhonchi. GI: Soft, nontender, bowel sounds present. MS: No edema; No deformity. Neuro:   Moves all extremities.  Labs    Chemistry Recent Labs  Lab 12/24/17 0401 12/25/17 1236 12/26/17 0423  NA 139 138 139  K 4.6 4.1 4.8  CL 110 108 110  CO2 22 21* 22  GLUCOSE 127* 120* 135*  BUN 23 20 24*  CREATININE 1.62* 1.31* 1.66*  CALCIUM 8.0* 8.7* 8.4*  PROT 6.6 7.5 7.0  ALBUMIN 2.4* 2.8* 2.6*  AST 24 36 60*  ALT 11 14 19   ALKPHOS 41 47 41  BILITOT 0.5 0.6 0.6  GFRNONAA 33* 42* 32*  GFRAA 38* 49* 37*  ANIONGAP 7 9 7      Hematology Recent Labs  Lab 12/23/17 0755 12/23/17 0757 12/25/17 1236 12/26/17 0423  WBC 11.2*  --  14.6* 12.8*  RBC 4.64  --  4.03 3.73*  HGB 12.7 13.9 11.4* 10.5*  HCT 41.3 41.0 36.4 34.9*  MCV 89.0  --  90.3 93.6  MCH 27.4  --  28.3 28.2  MCHC 30.8  --  31.3 30.1  RDW 12.8  --  13.2 13.5  PLT 375  --  375 342    Cardiac Enzymes Recent Labs  Lab 12/23/17 1211  TROPONINI 0.07*    Recent Labs  Lab 12/23/17 0755  TROPIPOC 0.08     Radiology    Dg Chest Port 1 View  Result Date: 12/26/2017 CLINICAL DATA:  Aspiration. EXAM: PORTABLE CHEST 1 VIEW COMPARISON:  12/23/2017. FINDINGS: Heart size stable given AP technique. No pulmonary venous congestion. Low lung volumes. Bibasilar pulmonary infiltrates/edema. Tiny bilateral pleural effusions cannot be excluded. No pneumothorax. No acute bony abnormality. IMPRESSION: Low lung volumes with bibasilar infiltrates/edema. Tiny pleural effusions cannot be excluded. Electronically Signed   By: Marcello Moores  Register   On: 12/26/2017 07:47    Cardiac Studies   Echocardiogram 12/24/2017: Study Conclusions  - Left ventricle: The cavity size was normal. Wall thickness was increased in a pattern of mild LVH. Systolic function was mildly reduced. The estimated ejection fraction was in the range of 45% to 50%. There is hypokinesis of the mid-apicalanteroseptal  and apical myocardium. Features are consistent with a pseudonormal left ventricular filling pattern, with concomitant abnormal relaxation and increased filling pressure (grade 2 diastolic dysfunction). - Aortic valve: Mildly to moderately calcified annulus. Probably trileaflet; mildly calcified leaflets. - Mitral valve: Mildly calcified annulus. There was mild regurgitation. - Right atrium: Central venous pressure (est): 3 mm Hg. - Atrial septum: No defect or patent foramen ovale was identified. - Tricuspid valve: There was trivial regurgitation. - Pulmonary arteries: PA peak pressure: 43 mm Hg (S). - Pericardium, extracardiac: There was no pericardial effusion.  Patient Profile     67 y.o. female with a history of PSVT, hypertension, type 2 diabetes mellitus, hepatitis C, hyperlipidemia, carotid artery disease and recurring cocaine abuse, now presenting with abnormal ECG and troponin I levels in the setting of recent cocaine use, also newly documented stroke.  Assessment & Plan    1.  Cardiomyopathy with LVEF 45 to 50%, anteroseptal/apical wall motion abnormalities in the setting of significantly abnormal ECG with deep anterolateral T wave inversions and recent cocaine use.  Troponin I levels are only minimally increased at 0.08 and 0.07.  2.  History of PSVT, no recurrences.  She is on amiodarone.  3.  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  Currently on aspirin and Plavix.  4.  Recurring cocaine abuse.  5.  Essential hypertension.  6.  Carotid artery disease, 50 to 69% RICA stenosis and nonobstructive LICA disease.  Increase Coreg to 6.25 mg twice daily, continue aspirin and Plavix as well as Lipitor and amiodarone.  Hold  off ARB to allow some degree of permissive hypertension with recent stroke and also with current fluctuating renal insufficiency.  Would hold Lasix as well for now.  Signed, Rozann Lesches, MD  12/26/2017, 8:38 AM

## 2017-12-26 NOTE — Progress Notes (Addendum)
12/26/2017 4:52 PM  Came back 3rd time to recheck. Pt's respiratory status does not seem to be improving. She has thick copious secretions in mouth and coarse breath sounds.  I am asking for a stat ABG and pending the results I think we should go ahead and intubate patient.    Update 5:47 PM  ABG results stable from this morning. Continue to monitor.  ABG    Component Value Date/Time   PHART 7.33 (L) 12/26/2017 1730   PCO2ART 41.7 12/26/2017 1730   PO2ART 116 (H) 12/26/2017 1730   HCO3 21.3 12/26/2017 1730   TCO2 32 12/23/2017 0757   ACIDBASEDEF 3.5 (H) 12/26/2017 1730   O2SAT 98.2 12/26/2017 1730   C. Wynetta Emery MD

## 2017-12-26 NOTE — Progress Notes (Signed)
PT Cancellation Note  Patient Details Name: Cassandra Jordan MRN: [id number] DOB: May 24, 1950   Cancelled Treatment:    Reason Eval/Treat Not Completed: Medical issues which prohibited therapy.  Patient held for therapy per RN request secondary to on BiPAP with possibly of intubation. Will check back tomorrow.   2:41 PM, 12/26/17 Lonell Grandchild, MPT Physical Therapist with [facility] [phone] office [phone] mobile phone

## 2017-12-26 NOTE — Progress Notes (Addendum)
Unable to accurately perform NIHSS d/t pt being medicated and currently unable to follow any commands. Pt will not open her eyes on her own but does pull her head away when attempting to manually lift her eye lids. Pt will not grip my hands but shrugs her shoulders in response to me moving her hands and arms. Pt also wearing CPAP and have seen none of the white sputum that was presented on day shift.  Will continue to assess and monitor patient.

## 2017-12-27 ENCOUNTER — Inpatient Hospital Stay (HOSPITAL_COMMUNITY): Payer: Medicare Other

## 2017-12-27 LAB — CBC WITH DIFFERENTIAL/PLATELET
Abs Immature Granulocytes: 0.11 10*3/uL — ABNORMAL HIGH (ref 0.00–0.07)
BASOS PCT: 0 %
Basophils Absolute: 0 10*3/uL (ref 0.0–0.1)
Eosinophils Absolute: 0 10*3/uL (ref 0.0–0.5)
Eosinophils Relative: 0 %
HCT: 48.1 % — ABNORMAL HIGH (ref 36.0–46.0)
Hemoglobin: 14.8 g/dL (ref 12.0–15.0)
Immature Granulocytes: 1 %
Lymphocytes Relative: 23 %
Lymphs Abs: 2.4 10*3/uL (ref 0.7–4.0)
MCH: 28.2 pg (ref 26.0–34.0)
MCHC: 30.8 g/dL (ref 30.0–36.0)
MCV: 91.6 fL (ref 80.0–100.0)
MONOS PCT: 6 %
Monocytes Absolute: 0.7 10*3/uL (ref 0.1–1.0)
Neutro Abs: 7.3 10*3/uL (ref 1.7–7.7)
Neutrophils Relative %: 70 %
Platelets: 211 10*3/uL (ref 150–400)
RBC: 5.25 MIL/uL — AB (ref 3.87–5.11)
RDW: 13.8 % (ref 11.5–15.5)
WBC: 10.5 10*3/uL (ref 4.0–10.5)
nRBC: 0 % (ref 0.0–0.2)

## 2017-12-27 LAB — GLUCOSE, CAPILLARY
GLUCOSE-CAPILLARY: 91 mg/dL (ref 70–99)
GLUCOSE-CAPILLARY: 102 mg/dL — AB (ref 70–99)
GLUCOSE-CAPILLARY: 96 mg/dL (ref 70–99)
Glucose-Capillary: 103 mg/dL — ABNORMAL HIGH (ref 70–99)
Glucose-Capillary: 103 mg/dL — ABNORMAL HIGH (ref 70–99)
Glucose-Capillary: 111 mg/dL — ABNORMAL HIGH (ref 70–99)
Glucose-Capillary: 114 mg/dL — ABNORMAL HIGH (ref 70–99)

## 2017-12-27 LAB — BASIC METABOLIC PANEL
Anion gap: 14 (ref 5–15)
BUN: 32 mg/dL — ABNORMAL HIGH (ref 8–23)
CO2: 15 mmol/L — ABNORMAL LOW (ref 22–32)
Calcium: 8.3 mg/dL — ABNORMAL LOW (ref 8.9–10.3)
Chloride: 113 mmol/L — ABNORMAL HIGH (ref 98–111)
Creatinine, Ser: 1.69 mg/dL — ABNORMAL HIGH (ref 0.44–1.00)
GFR calc Af Amer: 36 mL/min — ABNORMAL LOW (ref >60)
GFR calc non Af Amer: 31 mL/min — ABNORMAL LOW (ref >60)
Glucose, Bld: 96 mg/dL (ref 70–99)
Potassium: 4.9 mmol/L (ref 3.5–5.1)
SODIUM: 142 mmol/L (ref 135–145)

## 2017-12-27 LAB — BLOOD GAS, ARTERIAL
Acid-base deficit: 2.3 mmol/L — ABNORMAL HIGH (ref 0.0–2.0)
Bicarbonate: 22.3 mmol/L (ref 20.0–28.0)
DRAWN BY: 270161
O2 Content: 3 L/min
O2 Saturation: 95.8 %
Patient temperature: 37
pCO2 arterial: 41.8 mmHg (ref 32.0–48.0)
pH, Arterial: 7.34 — ABNORMAL LOW (ref 7.350–7.450)
pO2, Arterial: 82.9 mmHg — ABNORMAL LOW (ref 83.0–108.0)

## 2017-12-27 LAB — MAGNESIUM: Magnesium: 2.5 mg/dL — ABNORMAL HIGH (ref 1.7–2.4)

## 2017-12-27 MED ORDER — MORPHINE SULFATE (PF) 2 MG/ML IV SOLN
1.0000 mg | INTRAVENOUS | Status: DC | PRN
  Administered 2017-12-27 – 2017-12-30 (×7): 1 mg via INTRAVENOUS
  Filled 2017-12-27 (×7): qty 1

## 2017-12-27 MED ORDER — LORAZEPAM 2 MG/ML IJ SOLN
4.0000 mg | Freq: Once | INTRAMUSCULAR | Status: AC
  Administered 2017-12-27: 4 mg via INTRAMUSCULAR
  Filled 2017-12-27: qty 2

## 2017-12-27 MED ORDER — LORAZEPAM 1 MG PO TABS: 4.0000 mg | ORAL_TABLET | Freq: Once | ORAL | Status: AC

## 2017-12-27 MED ORDER — HYDROCODONE-ACETAMINOPHEN 5-325 MG PO TABS: 1.0000 | ORAL_TABLET | Freq: Four times a day (QID) | ORAL | Status: DC | PRN

## 2017-12-27 NOTE — Progress Notes (Signed)
Subjective: Essentially unchanged.  She remains on BiPAP.  She remains confused.  She does not seem to be struggling quite as much to breathe.  Chest x-ray that I have personally reviewed is basically unchanged perhaps with a little more vascular congestion  Objective: Vital signs in last 24 hours: Temp:  [97.5 F (36.4 C)-97.8 F (36.6 C)] 97.5 F (36.4 C) (12/06 0114) Pulse Rate:  [78-91] 80 (12/06 0600) Resp:  [17-34] 20 (12/06 0600) BP: (138-180)/(68-145) 138/68 (12/06 0600) SpO2:  [98 %-100 %] 99 % (12/06 0600) Weight:  [68.7 kg] 68.7 kg (12/06 0500) Weight change: 1.6 kg Last BM Date: 12/24/17  Intake/Output from previous day: 12/05 0701 - 12/06 0700 In: 571 [I.V.:471; IV Piggyback:100] Out: 500 [Urine:500]  PHYSICAL EXAM General appearance: Confused not as agitated on BiPAP Resp: rhonchi bilaterally Cardio: regular rate and rhythm, S1, S2 normal, no murmur, click, rub or gallop GI: soft, non-tender; bowel sounds normal; no masses,  no organomegaly Extremities: extremities normal, atraumatic, no cyanosis or edema  Lab Results:  Results for orders placed or performed during the hospital encounter of 12/23/17 (from the past 48 hour(s))  Glucose, capillary     Status: Abnormal   Collection Time: 12/25/17  7:50 AM  Result Value Ref Range   Glucose-Capillary 143 (H) 70 - 99 mg/dL  Glucose, capillary     Status: Abnormal   Collection Time: 12/25/17 11:45 AM  Result Value Ref Range   Glucose-Capillary 123 (H) 70 - 99 mg/dL  Comprehensive metabolic panel     Status: Abnormal   Collection Time: 12/25/17 12:36 PM  Result Value Ref Range   Sodium 138 135 - 145 mmol/L   Potassium 4.1 3.5 - 5.1 mmol/L   Chloride 108 98 - 111 mmol/L   CO2 21 (L) 22 - 32 mmol/L   Glucose, Bld 120 (H) 70 - 99 mg/dL   BUN 20 8 - 23 mg/dL   Creatinine, Ser 1.31 (H) 0.44 - 1.00 mg/dL   Calcium 8.7 (L) 8.9 - 10.3 mg/dL   Total Protein 7.5 6.5 - 8.1 g/dL   Albumin 2.8 (L) 3.5 - 5.0 g/dL   AST 36  15 - 41 U/L   ALT 14 0 - 44 U/L   Alkaline Phosphatase 47 38 - 126 U/L   Total Bilirubin 0.6 0.3 - 1.2 mg/dL   GFR calc non Af Amer 42 (L) >60 mL/min   GFR calc Af Amer 49 (L) >60 mL/min   Anion gap 9 5 - 15    Comment: Performed at [facility], [address], [place], [place] [address]  CBC with Differential/Platelet     Status: Abnormal   Collection Time: 12/25/17 12:36 PM  Result Value Ref Range   WBC 14.6 (H) 4.0 - 10.5 K/uL   RBC 4.03 3.87 - 5.11 MIL/uL   Hemoglobin 11.4 (L) 12.0 - 15.0 g/dL   HCT 36.4 36.0 - 46.0 %   MCV 90.3 80.0 - 100.0 fL   MCH 28.3 26.0 - 34.0 pg   MCHC 31.3 30.0 - 36.0 g/dL   RDW 13.2 11.5 - 15.5 %   Platelets 375 150 - 400 K/uL   nRBC 0.0 0.0 - 0.2 %   Neutrophils Relative % 70 %   Neutro Abs 10.3 (H) 1.7 - 7.7 K/uL   Lymphocytes Relative 20 %   Lymphs Abs 2.9 0.7 - 4.0 K/uL   Monocytes Relative 8 %   Monocytes Absolute 1.1 (H) 0.1 - 1.0 K/uL   Eosinophils  Relative 0 %   Eosinophils Absolute 0.0 0.0 - 0.5 K/uL   Basophils Relative 0 %   Basophils Absolute 0.1 0.0 - 0.1 K/uL   Immature Granulocytes 2 %   Abs Immature Granulocytes 0.22 (H) 0.00 - 0.07 K/uL    Comment: Performed at [facility], [address] [place], [place] [address]  Magnesium     Status: None   Collection Time: 12/25/17 12:36 PM  Result Value Ref Range   Magnesium 2.3 1.7 - 2.4 mg/dL    Comment: Performed at [facility], [address], [place], [place] [address]  Glucose, capillary     Status: None   Collection Time: 12/25/17  5:20 PM  Result Value Ref Range   Glucose-Capillary 90 70 - 99 mg/dL  Glucose, capillary     Status: None   Collection Time: 12/25/17 11:13 PM  Result Value Ref Range   Glucose-Capillary 98 70 - 99 mg/dL   Comment 1 Notify RN    Comment 2 Document in Chart   Comprehensive metabolic panel     Status: Abnormal   Collection Time: 12/26/17  4:23 AM  Result Value Ref Range   Sodium 139 135 - 145 mmol/L   Potassium 4.8 3.5 - 5.1 mmol/L   Chloride  110 98 - 111 mmol/L   CO2 22 22 - 32 mmol/L   Glucose, Bld 135 (H) 70 - 99 mg/dL   BUN 24 (H) 8 - 23 mg/dL   Creatinine, Ser 1.66 (H) 0.44 - 1.00 mg/dL   Calcium 8.4 (L) 8.9 - 10.3 mg/dL   Total Protein 7.0 6.5 - 8.1 g/dL   Albumin 2.6 (L) 3.5 - 5.0 g/dL   AST 60 (H) 15 - 41 U/L   ALT 19 0 - 44 U/L   Alkaline Phosphatase 41 38 - 126 U/L   Total Bilirubin 0.6 0.3 - 1.2 mg/dL   GFR calc non Af Amer 32 (L) >60 mL/min   GFR calc Af Amer 37 (L) >60 mL/min   Anion gap 7 5 - 15    Comment: Performed at [facility], [address] [place], [place] [address]  CBC with Differential/Platelet     Status: Abnormal   Collection Time: 12/26/17  4:23 AM  Result Value Ref Range   WBC 12.8 (H) 4.0 - 10.5 K/uL   RBC 3.73 (L) 3.87 - 5.11 MIL/uL   Hemoglobin 10.5 (L) 12.0 - 15.0 g/dL   HCT 34.9 (L) 36.0 - 46.0 %   MCV 93.6 80.0 - 100.0 fL   MCH 28.2 26.0 - 34.0 pg   MCHC 30.1 30.0 - 36.0 g/dL   RDW 13.5 11.5 - 15.5 %   Platelets 342 150 - 400 K/uL   nRBC 0.0 0.0 - 0.2 %   Neutrophils Relative % 78 %   Neutro Abs 9.9 (H) 1.7 - 7.7 K/uL   Lymphocytes Relative 14 %   Lymphs Abs 1.8 0.7 - 4.0 K/uL   Monocytes Relative 7 %   Monocytes Absolute 0.9 0.1 - 1.0 K/uL   Eosinophils Relative 0 %   Eosinophils Absolute 0.0 0.0 - 0.5 K/uL   Basophils Relative 0 %   Basophils Absolute 0.1 0.0 - 0.1 K/uL   Immature Granulocytes 1 %   Abs Immature Granulocytes 0.16 (H) 0.00 - 0.07 K/uL    Comment: Performed at [facility], [address], [place], [place] [address]  Magnesium     Status: None   Collection Time: 12/26/17  4:23 AM  Result Value Ref Range   Magnesium 2.3 1.7 - 2.4 mg/dL    Comment: Performed at [facility], [address] [place], [place] [address]  Blood gas, arterial     Status: Abnormal   Collection Time: 12/26/17  7:44 AM  Result Value Ref Range   O2 Content 6.0 L/min   Delivery systems CONTINUOUS POSITIVE AIRWAY PRESSURE    Expiratory PAP 8.0    pH, Arterial 7.300 (L) 7.350 -  7.450   pCO2 arterial 45.3 32.0 - 48.0 mmHg   pO2, Arterial 131 (H) 83.0 - 108.0 mmHg   Bicarbonate 20.9 20.0 - 28.0 mmol/L   Acid-base deficit 3.8 (H) 0.0 - 2.0 mmol/L   O2 Saturation 98.3 %   Patient temperature 37.0    Collection site RIGHT RADIAL    Drawn by [id number]    Sample type ARTERIAL DRAW    Allens test (pass/fail) PASS PASS    Comment: Performed at [facility], [address] [place], [place] [address]  Glucose, capillary     Status: Abnormal   Collection Time: 12/26/17  8:08 AM  Result Value Ref Range   Glucose-Capillary 115 (H) 70 - 99 mg/dL  Glucose, capillary     Status: Abnormal   Collection Time: 12/26/17 11:32 AM  Result Value Ref Range   Glucose-Capillary 132 (H) 70 - 99 mg/dL  Glucose, capillary     Status: None   Collection Time: 12/26/17  4:24 PM  Result Value Ref Range   Glucose-Capillary 98 70 - 99 mg/dL  Blood gas, arterial     Status: Abnormal   Collection Time: 12/26/17  5:30 PM  Result Value Ref Range   O2 Content 6.0 L/min   Delivery systems CONTINUOUS POSITIVE AIRWAY PRESSURE    Expiratory PAP 8.0    pH, Arterial 7.33 (L) 7.350 - 7.450   pCO2 arterial 41.7 32.0 - 48.0 mmHg   pO2, Arterial 116 (H) 83.0 - 108.0 mmHg   Bicarbonate 21.3 20.0 - 28.0 mmol/L   Acid-base deficit 3.5 (H) 0.0 - 2.0 mmol/L   O2 Saturation 98.2 %   Patient temperature 37.0    Collection site RIGHT BRACHIAL    Drawn by [id number]    Sample type ARTERIAL DRAW    Allens test (pass/fail) PASS PASS    Comment: Performed at [facility], [address], [place], [place] [address]  Glucose, capillary     Status: Abnormal   Collection Time: 12/26/17  9:24 PM  Result Value Ref Range   Glucose-Capillary 113 (H) 70 - 99 mg/dL  Glucose, capillary     Status: Abnormal   Collection Time: 12/27/17  1:59 AM  Result Value Ref Range   Glucose-Capillary 103 (H) 70 - 99 mg/dL  Glucose, capillary     Status: None   Collection Time: 12/27/17  4:06 AM  Result Value Ref Range    Glucose-Capillary 91 70 - 99 mg/dL  Magnesium     Status: Abnormal   Collection Time: 12/27/17  4:55 AM  Result Value Ref Range   Magnesium 2.5 (H) 1.7 - 2.4 mg/dL    Comment: Performed at [facility], [address] [place], [place] [address]  Basic metabolic panel     Status: Abnormal   Collection Time: 12/27/17  4:55 AM  Result Value Ref Range   Sodium 142 135 - 145 mmol/L   Potassium 4.9 3.5 - 5.1 mmol/L   Chloride 113 (H) 98 - 111 mmol/L   CO2 15 (L) 22 -  32 mmol/L   Glucose, Bld 96 70 - 99 mg/dL   BUN 32 (H) 8 - 23 mg/dL   Creatinine, Ser 1.69 (H) 0.44 - 1.00 mg/dL   Calcium 8.3 (L) 8.9 - 10.3 mg/dL   GFR calc non Af Amer 31 (L) >60 mL/min   GFR calc Af Amer 36 (L) >60 mL/min   Anion gap 14 5 - 15    Comment: Performed at [facility], [address] [place], [place] [address]  Glucose, capillary     Status: Abnormal   Collection Time: 12/27/17  7:34 AM  Result Value Ref Range   Glucose-Capillary 111 (H) 70 - 99 mg/dL    ABGS Recent Labs    12/26/17 1730  PHART 7.33*  PO2ART 116*  HCO3 21.3   CULTURES Recent Results (from the past 240 hour(s))  MRSA PCR Screening     Status: None   Collection Time: 12/23/17 12:20 PM  Result Value Ref Range Status   MRSA by PCR NEGATIVE NEGATIVE Final    Comment:        The GeneXpert MRSA Assay (FDA approved for NASAL specimens only), is one component of a comprehensive MRSA colonization surveillance program. It is not intended to diagnose MRSA infection nor to guide or monitor treatment for MRSA infections. Performed at [facility], [address], [place], [place] [address]    Studies/Results: Dg Chest Port 1 View  Result Date: 12/27/2017 CLINICAL DATA:  History of aspiration EXAM: PORTABLE CHEST 1 VIEW COMPARISON:  12/26/2017 FINDINGS: Cardiac shadow is stable. The lungs are well aerated bilaterally. Increasing central vascular congestion is noted. No sizable infiltrate or effusion is noted. No bony abnormality is  seen. IMPRESSION: Increasing central vascular markings. Electronically Signed   By: Inez Catalina M.D.   On: 12/27/2017 06:36   Dg Chest Port 1 View  Result Date: 12/26/2017 CLINICAL DATA:  Aspiration. EXAM: PORTABLE CHEST 1 VIEW COMPARISON:  12/23/2017. FINDINGS: Heart size stable given AP technique. No pulmonary venous congestion. Low lung volumes. Bibasilar pulmonary infiltrates/edema. Tiny bilateral pleural effusions cannot be excluded. No pneumothorax. No acute bony abnormality. IMPRESSION: Low lung volumes with bibasilar infiltrates/edema. Tiny pleural effusions cannot be excluded. Electronically Signed   By: Marcello Moores  Register   On: 12/26/2017 07:47    Medications:  Prior to Admission:  Medications Prior to Admission  Medication Sig Dispense Refill Last Dose  . amiodarone (PACERONE) 100 MG tablet Take 1 tablet (100 mg total) by mouth daily. TAKE ONE TABLET BY MOUTH ONCE DAILY 90 tablet 1 12/22/2017 at 0830am  . amLODipine (NORVASC) 10 MG tablet Take 1 tablet (10 mg total) by mouth daily. 30 tablet 1 12/18/2017  . aspirin EC 81 MG EC tablet Take 1 tablet (81 mg total) by mouth daily.   12/22/2017 at Unknown time  . atorvastatin (LIPITOR) 40 MG tablet Take 1 tablet (40 mg total) by mouth daily at 6 PM. 30 tablet 2 12/16/2017  . carvedilol (COREG) 6.25 MG tablet Take 6.25 mg by mouth 2 (two) times daily.  0 12/22/2017 at 0900pm  . cloNIDine (CATAPRES) 0.2 MG tablet Take 0.2 mg by mouth 2 (two) times daily.  0 12/22/2017 at Unknown time  . furosemide (LASIX) 20 MG tablet Take 20 mg by mouth daily.   12/22/2017 at Unknown time  . gabapentin (NEURONTIN) 600 MG tablet Take 1 tablet by mouth 3 (three) times daily.  0 12/22/2017 at Unknown time  . LANTUS 100 UNIT/ML injection Inject 9 Units into the skin  daily.   6 12/17/2017 at 0800am  . lisinopril-hydrochlorothiazide (PRINZIDE,ZESTORETIC) 20-25 MG tablet Take 1 tablet by mouth 2 (two) times daily.   12/22/2017 at Unknown time  . pantoprazole (PROTONIX) 40  MG tablet Take 1 tablet (40 mg total) by mouth daily. Take 30 minutes before breakfast 90 tablet 3 12/22/2017 at Unknown time  . potassium chloride (K-DUR) 10 MEQ tablet Take 1 tablet (10 mEq total) by mouth daily. 7 tablet 0 12/16/2017   Scheduled: . amiodarone  100 mg Oral Daily  . aspirin  325 mg Oral Daily   Or  . aspirin  300 mg Rectal Daily  . atorvastatin  80 mg Oral q1800  . carvedilol  6.25 mg Oral BID WC  . cloNIDine  0.1 mg Oral BID  . clopidogrel  75 mg Oral Q breakfast  . clotrimazole  1 Applicatorful Vaginal QHS  . enoxaparin (LOVENOX) injection  30 mg Subcutaneous Q24H  . folic acid  1 mg Oral Daily  . Influenza vac split quadrivalent PF  0.5 mL Intramuscular Tomorrow-1000  . insulin aspart  0-9 Units Subcutaneous Q4H  . mouth rinse  15 mL Mouth Rinse BID  . multivitamin with minerals  1 tablet Oral Daily  . thiamine  100 mg Oral Daily   Or  . thiamine  100 mg Intravenous Daily   Continuous: . sodium chloride 50 mL/hr at 12/26/17 1721  . ampicillin-sulbactam (UNASYN) IV 1.5 g (12/26/17 2122)   WNU:[id number] **OR** acetaminophen (TYLENOL) oral liquid 160 mg/5 mL **OR** acetaminophen, hydrALAZINE, LORazepam, nitroGLYCERIN, senna-docusate  Assesment: She was admitted with a stroke.  She has had trouble with PSVT diabetes drug abuse and she now has respiratory failure.  She is requiring BiPAP.  There was concern that she might need to be intubated yesterday but her blood gas held stable and she does seem to be struggling a little bit less this morning.  She is also having alcohol withdrawal.  He seems less agitated.  Chest x-ray basically unchanged.  Blood gas is pending.  Still high risk of needing intubation and mechanical ventilation but I feel a bit better about it this morning pending ABG Principal Problem:   Acute CVA (cerebrovascular accident) ([place]) Active Problems:   Essential hypertension   PSVT (paroxysmal supraventricular tachycardia) (HCC)   Chest pain    Diabetes mellitus, type II (HCC)   Tobacco abuse   Cocaine abuse ([place])   Generalized weakness   Hepatic cirrhosis (HCC)   GERD (gastroesophageal reflux disease)   Leukocytosis   Hypomagnesemia   Uncontrolled type 2 diabetes mellitus with hyperglycemia, with long-term current use of insulin (HCC)   CKD (chronic kidney disease), stage III (HCC)   Abnormal EKG   Cerebellar stroke ([place])    Plan: Continue treatments.  Continue BIPAP.  Check blood gas    LOS: 4 days   Caliya Narine L 12/27/2017, 7:44 AM

## 2017-12-27 NOTE — Progress Notes (Signed)
PT Cancellation Note  Patient Details Name: Cassandra Jordan MRN: [id number] DOB: Oct 15, 1950   Cancelled Treatment:    Reason Eval/Treat Not Completed: Medical issues which prohibited therapy.  Patient discharged from physical therapy until medically stable.  Recommendations: re-order physical therapy when patient medically stable to participate.  Thank you.   11:12 AM, 12/27/17 Lonell Grandchild, MPT Physical Therapist with [facility] [phone] office [phone] mobile phone

## 2017-12-27 NOTE — Progress Notes (Signed)
OT Cancellation Note  Patient Details Name: Cassandra Jordan MRN: [id number] DOB: 1950-07-21   Cancelled Treatment:    Reason Eval/Treat Not Completed: Patient not medically ready. Multiple attempts made to evaluate pt. Pt remains agitated, confused, lethargic, and inappropriate for OT services at this time. OT will sign off at this time, please reconsult when pt is medically appropriate for OT services. Thank you for this referral.   Guadelupe Sabin, OTR/L  [phone] 12/27/2017, 8:51 AM

## 2017-12-27 NOTE — Progress Notes (Signed)
Pt is beginning to move around a little more. Still will not grip hands but moves all limbs in response to stimulus. She will not open her eyes on her own but moves her head away when physically lift eye lids and shine pin light in her eyes. Pupils are equal and reactive to light. Continuing to monitor

## 2017-12-27 NOTE — Progress Notes (Signed)
Pt is moving in the bed; moving all extremities. Pt will grasp hands and withdraws from pain. She is trying to speak through the CPAP but are unable to understand her.

## 2017-12-27 NOTE — Progress Notes (Signed)
Pt writhing around in bed, loudly moaning and groaning. Asked pt if in pain and she nodded yes. Pt takes 900 mg gabapentin at home. Asked for IV pain medication. Administered morphine per orders. Will continue to monitor and assess patient.

## 2017-12-27 NOTE — Progress Notes (Signed)
Called unit regarding Midline order.  Spoke with Building surveyor who states that [place], RN called Vascular wellness to place and they are at bedside placing line.  Carolee Rota, RN VAST

## 2017-12-27 NOTE — Progress Notes (Signed)
Progress Note  Patient Name: Cassandra Jordan Date of Encounter: 12/27/2017  Primary Cardiologist: Dr. Satira Sark  Subjective   Patient agitated as before.  Currently on CPAP.  Inpatient Medications    Scheduled Meds: . amiodarone  100 mg Oral Daily  . aspirin  325 mg Oral Daily   Or  . aspirin  300 mg Rectal Daily  . atorvastatin  80 mg Oral q1800  . carvedilol  6.25 mg Oral BID WC  . cloNIDine  0.1 mg Oral BID  . clopidogrel  75 mg Oral Q breakfast  . clotrimazole  1 Applicatorful Vaginal QHS  . enoxaparin (LOVENOX) injection  30 mg Subcutaneous Q24H  . folic acid  1 mg Oral Daily  . Influenza vac split quadrivalent PF  0.5 mL Intramuscular Tomorrow-1000  . insulin aspart  0-9 Units Subcutaneous Q4H  . mouth rinse  15 mL Mouth Rinse BID  . multivitamin with minerals  1 tablet Oral Daily  . thiamine  100 mg Oral Daily   Or  . thiamine  100 mg Intravenous Daily   Continuous Infusions: . sodium chloride 50 mL/hr at 12/26/17 1721  . ampicillin-sulbactam (UNASYN) IV 1.5 g (12/26/17 2122)   PRN Meds: acetaminophen **OR** acetaminophen (TYLENOL) oral liquid 160 mg/5 mL **OR** acetaminophen, hydrALAZINE, LORazepam, nitroGLYCERIN, senna-docusate   Vital Signs    Vitals:   12/27/17 0500 12/27/17 0600 12/27/17 0748 12/27/17 0800  BP: (!) 176/86 138/68  (!) 154/71  Pulse: 84 80 80 78  Resp: 19 20 19 20   Temp:   97.8 F (36.6 C)   TempSrc:   Axillary   SpO2: 99% 99% 98% 98%  Weight: 68.7 kg     Height:        Intake/Output Summary (Last 24 hours) at 12/27/2017 0847 Last data filed at 12/26/2017 1642 Gross per 24 hour  Intake 570.98 ml  Output 500 ml  Net 70.98 ml   Filed Weights   12/24/17 0455 12/26/17 0328 12/27/17 0500  Weight: 66.3 kg 67.1 kg 68.7 kg    Telemetry    Atrial fibrillation.  Personally reviewed.  Physical Exam   GEN: No obvious distress.   Neck: No JVD. Cardiac:  Irregularly irregular, no gallop.  Respiratory: Nonlabored.   Coarse breath sounds without wheeze. GI: Soft, nontender, bowel sounds present. MS: No edema; No deformity. Neuro:  Moves extremities. Psych: Intermittently agitated.  Labs    Chemistry Recent Labs  Lab 12/24/17 0401 12/25/17 1236 12/26/17 0423 12/27/17 0455  NA 139 138 139 142  K 4.6 4.1 4.8 4.9  CL 110 108 110 113*  CO2 22 21* 22 15*  GLUCOSE 127* 120* 135* 96  BUN 23 20 24* 32*  CREATININE 1.62* 1.31* 1.66* 1.69*  CALCIUM 8.0* 8.7* 8.4* 8.3*  PROT 6.6 7.5 7.0  --   ALBUMIN 2.4* 2.8* 2.6*  --   AST 24 36 60*  --   ALT 11 14 19   --   ALKPHOS 41 47 41  --   BILITOT 0.5 0.6 0.6  --   GFRNONAA 33* 42* 32* 31*  GFRAA 38* 49* 37* 36*  ANIONGAP 7 9 7 14      Hematology Recent Labs  Lab 12/25/17 1236 12/26/17 0423 12/27/17 0615  WBC 14.6* 12.8* 10.5  RBC 4.03 3.73* 5.25*  HGB 11.4* 10.5* 14.8  HCT 36.4 34.9* 48.1*  MCV 90.3 93.6 91.6  MCH 28.3 28.2 28.2  MCHC 31.3 30.1 30.8  RDW 13.2 13.5 13.8  PLT 375 342 211    Cardiac Enzymes Recent Labs  Lab 12/23/17 1211  TROPONINI 0.07*    Recent Labs  Lab 12/23/17 0755  TROPIPOC 0.08     Radiology    Dg Chest Port 1 View  Result Date: 12/27/2017 CLINICAL DATA:  History of aspiration EXAM: PORTABLE CHEST 1 VIEW COMPARISON:  12/26/2017 FINDINGS: Cardiac shadow is stable. The lungs are well aerated bilaterally. Increasing central vascular congestion is noted. No sizable infiltrate or effusion is noted. No bony abnormality is seen. IMPRESSION: Increasing central vascular markings. Electronically Signed   By: Inez Catalina M.D.   On: 12/27/2017 06:36   Dg Chest Port 1 View  Result Date: 12/26/2017 CLINICAL DATA:  Aspiration. EXAM: PORTABLE CHEST 1 VIEW COMPARISON:  12/23/2017. FINDINGS: Heart size stable given AP technique. No pulmonary venous congestion. Low lung volumes. Bibasilar pulmonary infiltrates/edema. Tiny bilateral pleural effusions cannot be excluded. No pneumothorax. No acute bony abnormality. IMPRESSION:  Low lung volumes with bibasilar infiltrates/edema. Tiny pleural effusions cannot be excluded. Electronically Signed   By: Marcello Moores  Register   On: 12/26/2017 07:47    Cardiac Studies   Echocardiogram 12/24/2017: Study Conclusions  - Left ventricle: The cavity size was normal. Wall thickness was increased in a pattern of mild LVH. Systolic function was mildly reduced. The estimated ejection fraction was in the range of 45% to 50%. There is hypokinesis of the mid-apicalanteroseptal and apical myocardium. Features are consistent with a pseudonormal left ventricular filling pattern, with concomitant abnormal relaxation and increased filling pressure (grade 2 diastolic dysfunction). - Aortic valve: Mildly to moderately calcified annulus. Probably trileaflet; mildly calcified leaflets. - Mitral valve: Mildly calcified annulus. There was mild regurgitation. - Right atrium: Central venous pressure (est): 3 mm Hg. - Atrial septum: No defect or patent foramen ovale was identified. - Tricuspid valve: There was trivial regurgitation. - Pulmonary arteries: PA peak pressure: 43 mm Hg (S). - Pericardium, extracardiac: There was no pericardial effusion.  Patient Profile     67 y.o. female with a history of PSVT, hypertension, type 2 diabetes mellitus, hepatitis C, hyperlipidemia, carotid artery disease and recurring cocaine abuse, now presenting with abnormal ECG and troponin I levels in the setting of recent cocaine use, also newly documented stroke.  Assessment & Plan    1.  Cardiomyopathy with LVEF 45 to 50% range, anteroseptal/apical wall motion abnormalities in the setting of significant abnormal ECG with deep anterolateral T wave inversions and recurring cocaine abuse. Troponin I levels are minimally elevated in flat pattern, 0.08 and 0.07.  Medical management anticipated at this time.  2.  History of PSVT, no recurrences.  She is on amiodarone for rhythm suppression.  3.  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  Currently on aspirin and Plavix.  4.  Recurring cocaine abuse.  5.  Essential hypertension.  6.  Carotid artery disease, 50 to 69% RICA stenosis and nonobstructive LICA disease.  7.  Agitation, being managed for alcohol withdrawal per primary team on CIWA protocol.  Continue supportive measures.  Current regimen includes amiodarone, Coreg, clonidine, Lipitor, and Plavix.  Not adding ARB at this time in light of recent stroke for some degree of permissive hypertension, also renal insufficiency with creatinine 1.69.  Further invasive cardiac testing is not planned.  Overall prognosis is guarded.  Signed,  Rozann Lesches, MD  12/27/2017, 8:47 AM

## 2017-12-27 NOTE — Progress Notes (Signed)
PROGRESS NOTE  Cassandra Jordan  FGH:[id number]  DOB: 22-Mar-1950  DOA: 12/23/2017 PCP: [facility]   Brief Admission Hx: 67 y.o. female chronic cocaine abuser, smoker, diabetic, with known cerebrovascular and heart disease presented to ED with complaints of a frontal headache and acute CVA.    MDM/Assessment & Plan:   1. Acute CVA - MRI reveals multiple acute infarcts in deep white matter and along the left PICA territory.  Pt is on CVA treatment protocol.  Neuro recommends dual antiplatelet therapy.  Avoid abrupt and rapid drop in blood pressure.  2. 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  Will try to arrange for outpatient vascular surgery consultation when medically stabilized.  Neurology has recommended 30 day event monitor and I have called [place] office to arrange.   3. Dyslipidemia - LDL of 181 - continue atorvastatin 80 mg daily. 4. Prolonged QTc - monitoring on telemetry.  Monitor lytes. Continuous telemetry. 5. Aspiration pneumonia - IV Unasyn started.  NPO for now.   6. Acute respiratory distress - She is on CPAP/Bipap therapy and has been stable, morning ABG pending.  Dr. Luan Pulling following for pulmonology management.   7. Acute alcohol withdrawal - Pt on CIWA protocol, safety restraints and sitter ordered for safety.  Avoiding precedex due to markedly prolonged QTc.   8. Essential hypertension - allowing  permissive hypertension in the setting of acute CVA.  Plan to gradually lower BP over next several days.  Per neurology, avoid abrupt and rapid drop in blood pressure.   9. PSVT - cardiology has restarted patient on her home amiodarone and carvedilol.   10. Type 2 DM - CBG Q4h while NPO plus SSI coverage. A1c is 6.7% which is at ADA goal.  11. Cocaine abuse - Social worker consulted to discuss treatment options with patient.    DVT prophylaxis:  lovenox Code Status: Full  Family Communication: none  Disposition Plan: SNF recommended  Consultants:  Neurology  cardiology  Antimicrobials:  Unasyn 12/5  Subjective: Pt less confused this morning and follows some commands which is an improvement.      Objective: Vitals:   12/27/17 0400 12/27/17 0500 12/27/17 0600 12/27/17 0748  BP: (!) 170/83 (!) 176/86 138/68   Pulse: 82 84 80 80  Resp: 18 19 20 19   Temp:    97.8 F (36.6 C)  TempSrc:    Axillary  SpO2: 99% 99% 99% 98%  Weight:  68.7 kg    Height:        Intake/Output Summary (Last 24 hours) at 12/27/2017 6789 Last data filed at 12/26/2017 1642 Gross per 24 hour  Intake 570.98 ml  Output 500 ml  Net 70.98 ml   Filed Weights   12/24/17 0455 12/26/17 0328 12/27/17 0500  Weight: 66.3 kg 67.1 kg 68.7 kg   REVIEW OF SYSTEMS  UTO due to encephalopathy  Exam:  General exam: Pt is lethargic but arouses to noxious stimuli.  Respiratory system:  Coarse airway noises and bilateral rales, Moderate increased work of breathing. On Cpap.  Cardiovascular system: S1 & S2 heard. No JVD, murmurs, gallops, clicks or pedal edema. Gastrointestinal system: Abdomen is nondistended, soft and nontender. Normal bowel sounds heard. Central nervous system:  Moving all extremities.  Extremities: no cyanosis and no pretibial edema.   Data Reviewed: Basic Metabolic Panel: Recent Labs  Lab 12/23/17 0755 12/23/17 0757 12/24/17 0401 12/25/17 1236 12/26/17  0423 12/27/17 0455  NA 140 140 139 138 139 142  K 2.8* 3.0* 4.6 4.1 4.8 4.9  CL 99 99 110 108 110 113*  CO2 31  --  22 21* 22 15*  GLUCOSE 128* 126* 127* 120* 135* 96  BUN 22 23 23 20  24* 32*  CREATININE 1.48* 1.40* 1.62* 1.31* 1.66* 1.69*  CALCIUM 8.8*  --  8.0* 8.7* 8.4* 8.3*  MG 1.9  --  1.8 2.3 2.3 2.5*   Liver Function Tests: Recent Labs  Lab 12/24/17 0401 12/25/17 1236 12/26/17 0423  AST 24 36 60*  ALT 11 14 19   ALKPHOS 41 47 41  BILITOT 0.5 0.6 0.6  PROT  6.6 7.5 7.0  ALBUMIN 2.4* 2.8* 2.6*   No results for input(s): LIPASE, AMYLASE in the last 168 hours. No results for input(s): AMMONIA in the last 168 hours. CBC: Recent Labs  Lab 12/23/17 0755 12/23/17 0757 12/25/17 1236 12/26/17 0423 12/27/17 0615  WBC 11.2*  --  14.6* 12.8* 10.5  NEUTROABS 6.8  --  10.3* 9.9* 7.3  HGB 12.7 13.9 11.4* 10.5* 14.8  HCT 41.3 41.0 36.4 34.9* 48.1*  MCV 89.0  --  90.3 93.6 91.6  PLT 375  --  375 342 211   Cardiac Enzymes: Recent Labs  Lab 12/23/17 1211  TROPONINI 0.07*   CBG (last 3)  Recent Labs    12/27/17 0159 12/27/17 0406 12/27/17 0734  GLUCAP 103* 91 111*   Recent Results (from the past 240 hour(s))  MRSA PCR Screening     Status: None   Collection Time: 12/23/17 12:20 PM  Result Value Ref Range Status   MRSA by PCR NEGATIVE NEGATIVE Final    Comment:        The GeneXpert MRSA Assay (FDA approved for NASAL specimens only), is one component of a comprehensive MRSA colonization surveillance program. It is not intended to diagnose MRSA infection nor to guide or monitor treatment for MRSA infections. Performed at [facility], [address], [place], [place] [address]      Studies: Dg Chest [facility] 1 View  Result Date: 12/27/2017 CLINICAL DATA:  History of aspiration EXAM: PORTABLE CHEST 1 VIEW COMPARISON:  12/26/2017 FINDINGS: Cardiac shadow is stable. The lungs are well aerated bilaterally. Increasing central vascular congestion is noted. No sizable infiltrate or effusion is noted. No bony abnormality is seen. IMPRESSION: Increasing central vascular markings. Electronically Signed   By: Inez Catalina M.D.   On: 12/27/2017 06:36   Dg Chest Port 1 View  Result Date: 12/26/2017 CLINICAL DATA:  Aspiration. EXAM: PORTABLE CHEST 1 VIEW COMPARISON:  12/23/2017. FINDINGS: Heart size stable given AP technique. No pulmonary venous congestion. Low lung volumes. Bibasilar pulmonary infiltrates/edema. Tiny bilateral pleural effusions  cannot be excluded. No pneumothorax. No acute bony abnormality. IMPRESSION: Low lung volumes with bibasilar infiltrates/edema. Tiny pleural effusions cannot be excluded. Electronically Signed   By: [place]   On: 12/26/2017 07:47   Scheduled Meds: . amiodarone  100 mg Oral Daily  . aspirin  325 mg Oral Daily   Or  . aspirin  300 mg Rectal Daily  . atorvastatin  80 mg Oral q1800  . carvedilol  6.25 mg Oral BID WC  . cloNIDine  0.1 mg Oral BID  . clopidogrel  75 mg Oral Q breakfast  . clotrimazole  1 Applicatorful Vaginal QHS  . enoxaparin (LOVENOX) injection  30 mg Subcutaneous Q24H  . folic acid  1 mg Oral Daily  . Influenza  vac split quadrivalent PF  0.5 mL Intramuscular Tomorrow-1000  . insulin aspart  0-9 Units Subcutaneous Q4H  . mouth rinse  15 mL Mouth Rinse BID  . multivitamin with minerals  1 tablet Oral Daily  . thiamine  100 mg Oral Daily   Or  . thiamine  100 mg Intravenous Daily   Continuous Infusions: . sodium chloride 50 mL/hr at 12/26/17 1721  . ampicillin-sulbactam (UNASYN) IV 1.5 g (12/26/17 2122)    Principal Problem:   Acute CVA (cerebrovascular accident) [facility]) Active Problems:   Essential hypertension   PSVT (paroxysmal supraventricular tachycardia) (HCC)   Chest pain   Diabetes mellitus, type II (HCC)   Tobacco abuse   Cocaine abuse (HCC)   Generalized weakness   Hepatic cirrhosis (HCC)   GERD (gastroesophageal reflux disease)   Leukocytosis   Hypomagnesemia   Uncontrolled type 2 diabetes mellitus with hyperglycemia, with long-term current use of insulin (HCC)   CKD (chronic kidney disease), stage III (HCC)   Abnormal EKG   Cerebellar stroke [facility] 2)  Critical Care Time spent: 33 minutes  Irwin Brakeman, MD [facility] Pager [phone]  If 7PM-7AM, please contact night-coverage [email or web address] Password [email or web address] 12/27/2017, 8:32 AM    LOS: 4 days

## 2017-12-28 ENCOUNTER — Inpatient Hospital Stay (HOSPITAL_COMMUNITY): Payer: Medicare Other

## 2017-12-28 DIAGNOSIS — T17908D Unspecified foreign body in respiratory tract, part unspecified causing other injury, subsequent encounter: Secondary | ICD-10-CM

## 2017-12-28 LAB — COMPREHENSIVE METABOLIC PANEL
ALK PHOS: 39 U/L (ref 38–126)
ALT: 23 U/L (ref 0–44)
AST: 58 U/L — ABNORMAL HIGH (ref 15–41)
Albumin: 2.6 g/dL — ABNORMAL LOW (ref 3.5–5.0)
Anion gap: 7 (ref 5–15)
BUN: 28 mg/dL — ABNORMAL HIGH (ref 8–23)
CALCIUM: 8.8 mg/dL — AB (ref 8.9–10.3)
CO2: 24 mmol/L (ref 22–32)
CREATININE: 1.31 mg/dL — AB (ref 0.44–1.00)
Chloride: 113 mmol/L — ABNORMAL HIGH (ref 98–111)
GFR calc Af Amer: 49 mL/min — ABNORMAL LOW (ref >60)
GFR calc non Af Amer: 42 mL/min — ABNORMAL LOW (ref >60)
Glucose, Bld: 111 mg/dL — ABNORMAL HIGH (ref 70–99)
Potassium: 3.8 mmol/L (ref 3.5–5.1)
Sodium: 144 mmol/L (ref 135–145)
Total Bilirubin: 0.7 mg/dL (ref 0.3–1.2)
Total Protein: 7.1 g/dL (ref 6.5–8.1)

## 2017-12-28 LAB — CBC WITH DIFFERENTIAL/PLATELET
Abs Immature Granulocytes: 0.15 10*3/uL — ABNORMAL HIGH (ref 0.00–0.07)
Basophils Absolute: 0.1 10*3/uL (ref 0.0–0.1)
Basophils Relative: 0 %
EOS PCT: 1 %
Eosinophils Absolute: 0.2 10*3/uL (ref 0.0–0.5)
HEMATOCRIT: 34.2 % — AB (ref 36.0–46.0)
Hemoglobin: 10.4 g/dL — ABNORMAL LOW (ref 12.0–15.0)
Immature Granulocytes: 1 %
Lymphocytes Relative: 24 %
Lymphs Abs: 3.4 10*3/uL (ref 0.7–4.0)
MCH: 28 pg (ref 26.0–34.0)
MCHC: 30.4 g/dL (ref 30.0–36.0)
MCV: 92.2 fL (ref 80.0–100.0)
MONO ABS: 1.4 10*3/uL — AB (ref 0.1–1.0)
Monocytes Relative: 10 %
Neutro Abs: 8.9 10*3/uL — ABNORMAL HIGH (ref 1.7–7.7)
Neutrophils Relative %: 64 %
Platelets: 346 10*3/uL (ref 150–400)
RBC: 3.71 MIL/uL — ABNORMAL LOW (ref 3.87–5.11)
RDW: 13.8 % (ref 11.5–15.5)
WBC: 14.1 10*3/uL — ABNORMAL HIGH (ref 4.0–10.5)
nRBC: 0 % (ref 0.0–0.2)

## 2017-12-28 LAB — GLUCOSE, CAPILLARY
GLUCOSE-CAPILLARY: 89 mg/dL (ref 70–99)
Glucose-Capillary: 100 mg/dL — ABNORMAL HIGH (ref 70–99)
Glucose-Capillary: 111 mg/dL — ABNORMAL HIGH (ref 70–99)
Glucose-Capillary: 116 mg/dL — ABNORMAL HIGH (ref 70–99)
Glucose-Capillary: 88 mg/dL (ref 70–99)
Glucose-Capillary: 94 mg/dL (ref 70–99)

## 2017-12-28 LAB — MAGNESIUM: Magnesium: 2.2 mg/dL (ref 1.7–2.4)

## 2017-12-28 MED ORDER — AMLODIPINE BESYLATE 5 MG PO TABS
5.0000 mg | ORAL_TABLET | Freq: Every day | ORAL | Status: DC
  Administered 2018-01-03 – 2018-01-10 (×7): 5 mg via ORAL
  Filled 2017-12-28 (×9): qty 1

## 2017-12-28 MED ORDER — SODIUM CHLORIDE 0.9 % IV SOLN
INTRAVENOUS | Status: DC | PRN
  Administered 2017-12-28: 1000 mL via INTRAVENOUS

## 2017-12-28 MED ORDER — ALBUTEROL SULFATE (2.5 MG/3ML) 0.083% IN NEBU: 2.5000 mg | INHALATION_SOLUTION | RESPIRATORY_TRACT | Status: DC | PRN

## 2017-12-28 NOTE — Progress Notes (Signed)
Pt restless throughout the night. Did need PRN ativan but did little to calm her. She continued to pull all leads off continuously and pulled oxygen off. She gets agitated when replacing leads or oxygen. She is sleeping a little better at the present time.

## 2017-12-28 NOTE — Progress Notes (Signed)
Pt has rested some throughout the shift. She continues to move around quite a bit in bed, yelling for [employer].

## 2017-12-28 NOTE — Progress Notes (Signed)
Patient wears or supposedly has a CPAP at pressure of 9 which she wears at home. She came in for stroke, but also is alcohol and cocaine positive. Most likely going through withdrawal. She is 97 on 3 lpm oxygen. She has had Sleep study. Lungs appear diminished. Will place on CPAP if possible.

## 2017-12-28 NOTE — Progress Notes (Signed)
Subjective: She continues to be agitated.  She is pulled off most of the monitors.  Objective: Vital signs in last 24 hours: Temp:  [97.4 F (36.3 C)-98.4 F (36.9 C)] 97.4 F (36.3 C) (12/07 0730) Pulse Rate:  [84-99] 99 (12/07 0900) Resp:  [18-37] 20 (12/07 0800) BP: (156-215)/(84-180) 194/100 (12/07 0900) SpO2:  [89 %-100 %] 93 % (12/07 0900) Weight change:  Last BM Date: 12/27/17  Intake/Output from previous day: 12/06 0701 - 12/07 0700 In: -  Out: 1150 [Urine:1150]  PHYSICAL EXAM General appearance: She is alternately sleepy and agitated Resp: rhonchi bilaterally Cardio: regular rate and rhythm, S1, S2 normal, no murmur, click, rub or gallop GI: soft, non-tender; bowel sounds normal; no masses,  no organomegaly Extremities: extremities normal, atraumatic, no cyanosis or edema  Lab Results:  Results for orders placed or performed during the hospital encounter of 12/23/17 (from the past 48 hour(s))  Glucose, capillary     Status: Abnormal   Collection Time: 12/26/17 11:32 AM  Result Value Ref Range   Glucose-Capillary 132 (H) 70 - 99 mg/dL  Glucose, capillary     Status: None   Collection Time: 12/26/17  4:24 PM  Result Value Ref Range   Glucose-Capillary 98 70 - 99 mg/dL  Blood gas, arterial     Status: Abnormal   Collection Time: 12/26/17  5:30 PM  Result Value Ref Range   O2 Content 6.0 L/min   Delivery systems CONTINUOUS POSITIVE AIRWAY PRESSURE    Expiratory PAP 8.0    pH, Arterial 7.33 (L) 7.350 - 7.450   pCO2 arterial 41.7 32.0 - 48.0 mmHg   pO2, Arterial 116 (H) 83.0 - 108.0 mmHg   Bicarbonate 21.3 20.0 - 28.0 mmol/L   Acid-base deficit 3.5 (H) 0.0 - 2.0 mmol/L   O2 Saturation 98.2 %   Patient temperature 37.0    Collection site RIGHT BRACHIAL    Drawn by [id number]    Sample type ARTERIAL DRAW    Allens test (pass/fail) PASS PASS    Comment: Performed at [facility], [address], [place], [place] [address]  Glucose, capillary     Status: Abnormal    Collection Time: 12/26/17  9:24 PM  Result Value Ref Range   Glucose-Capillary 113 (H) 70 - 99 mg/dL  Glucose, capillary     Status: Abnormal   Collection Time: 12/27/17  1:59 AM  Result Value Ref Range   Glucose-Capillary 103 (H) 70 - 99 mg/dL  Glucose, capillary     Status: None   Collection Time: 12/27/17  4:06 AM  Result Value Ref Range   Glucose-Capillary 91 70 - 99 mg/dL  Magnesium     Status: Abnormal   Collection Time: 12/27/17  4:55 AM  Result Value Ref Range   Magnesium 2.5 (H) 1.7 - 2.4 mg/dL    Comment: Performed at [facility], [address], [place],  [address]  Basic metabolic panel     Status: Abnormal   Collection Time: 12/27/17  4:55 AM  Result Value Ref Range   Sodium 142 135 - 145 mmol/L   Potassium 4.9 3.5 - 5.1 mmol/L   Chloride 113 (H) 98 - 111 mmol/L   CO2 15 (L) 22 - 32 mmol/L   Glucose, Bld 96 70 - 99 mg/dL   BUN 32 (H) 8 - 23 mg/dL   Creatinine, Ser 1.69 (H) 0.44 - 1.00 mg/dL   Calcium 8.3 (L) 8.9 - 10.3 mg/dL   GFR calc non Af Amer 31 (  L) >60 mL/min   GFR calc Af Amer 36 (L) >60 mL/min   Anion gap 14 5 - 15    Comment: Performed at [facility], [address], [place], [place] [address]  CBC with Differential/Platelet     Status: Abnormal   Collection Time: 12/27/17  6:15 AM  Result Value Ref Range   WBC 10.5 4.0 - 10.5 K/uL   RBC 5.25 (H) 3.87 - 5.11 MIL/uL   Hemoglobin 14.8 12.0 - 15.0 g/dL    Comment: REPEATED TO VERIFY DELTA CHECK NOTED    HCT 48.1 (H) 36.0 - 46.0 %   MCV 91.6 80.0 - 100.0 fL   MCH 28.2 26.0 - 34.0 pg   MCHC 30.8 30.0 - 36.0 g/dL   RDW 13.8 11.5 - 15.5 %   Platelets 211 150 - 400 K/uL   nRBC 0.0 0.0 - 0.2 %   Neutrophils Relative % 70 %   Neutro Abs 7.3 1.7 - 7.7 K/uL   Lymphocytes Relative 23 %   Lymphs Abs 2.4 0.7 - 4.0 K/uL   Monocytes Relative 6 %   Monocytes Absolute 0.7 0.1 - 1.0 K/uL   Eosinophils Relative 0 %   Eosinophils Absolute 0.0 0.0 - 0.5 K/uL   Basophils Relative 0 %   Basophils Absolute  0.0 0.0 - 0.1 K/uL   Immature Granulocytes 1 %   Abs Immature Granulocytes 0.11 (H) 0.00 - 0.07 K/uL    Comment: Performed at [facility], [address], [place], [place] [address]  Glucose, capillary     Status: Abnormal   Collection Time: 12/27/17  7:34 AM  Result Value Ref Range   Glucose-Capillary 111 (H) 70 - 99 mg/dL  Blood gas, arterial     Status: Abnormal   Collection Time: 12/27/17 10:20 AM  Result Value Ref Range   O2 Content 3.0 L/min   Delivery systems NASAL CANNULA    pH, Arterial 7.34 (L) 7.350 - 7.450   pCO2 arterial 41.8 32.0 - 48.0 mmHg   pO2, Arterial 82.9 (L) 83.0 - 108.0 mmHg   Bicarbonate 22.3 20.0 - 28.0 mmol/L   Acid-base deficit 2.3 (H) 0.0 - 2.0 mmol/L   O2 Saturation 95.8 %   Patient temperature 37.0    Collection site RIGHT RADIAL    Drawn by [id number]    Sample type ARTERIAL DRAW    Allens test (pass/fail) PASS PASS    Comment: Performed at [facility], [address] [place], [place] [address]  Glucose, capillary     Status: Abnormal   Collection Time: 12/27/17 11:21 AM  Result Value Ref Range   Glucose-Capillary 102 (H) 70 - 99 mg/dL  Glucose, capillary     Status: None   Collection Time: 12/27/17  4:13 PM  Result Value Ref Range   Glucose-Capillary 96 70 - 99 mg/dL  Glucose, capillary     Status: Abnormal   Collection Time: 12/27/17  8:12 PM  Result Value Ref Range   Glucose-Capillary 114 (H) 70 - 99 mg/dL  Glucose, capillary     Status: Abnormal   Collection Time: 12/27/17 11:49 PM  Result Value Ref Range   Glucose-Capillary 103 (H) 70 - 99 mg/dL  Comprehensive metabolic panel     Status: Abnormal   Collection Time: 12/28/17  4:36 AM  Result Value Ref Range   Sodium 144 135 - 145 mmol/L   Potassium 3.8 3.5 - 5.1 mmol/L    Comment: DELTA CHECK NOTED   Chloride 113 (H) 98 -  111 mmol/L   CO2 24 22 - 32 mmol/L   Glucose, Bld 111 (H) 70 - 99 mg/dL   BUN 28 (H) 8 - 23 mg/dL   Creatinine, Ser 1.31 (H) 0.44 - 1.00 mg/dL   Calcium 8.8 (L) 8.9  - 10.3 mg/dL   Total Protein 7.1 6.5 - 8.1 g/dL   Albumin 2.6 (L) 3.5 - 5.0 g/dL   AST 58 (H) 15 - 41 U/L   ALT 23 0 - 44 U/L   Alkaline Phosphatase 39 38 - 126 U/L   Total Bilirubin 0.7 0.3 - 1.2 mg/dL   GFR calc non Af Amer 42 (L) >60 mL/min   GFR calc Af Amer 49 (L) >60 mL/min   Anion gap 7 5 - 15    Comment: Performed at [facility], [address], [place], [place] [address]  CBC with Differential/Platelet     Status: Abnormal   Collection Time: 12/28/17  4:36 AM  Result Value Ref Range   WBC 14.1 (H) 4.0 - 10.5 K/uL   RBC 3.71 (L) 3.87 - 5.11 MIL/uL   Hemoglobin 10.4 (L) 12.0 - 15.0 g/dL    Comment: REPEATED TO VERIFY DELTA CHECK NOTED    HCT 34.2 (L) 36.0 - 46.0 %   MCV 92.2 80.0 - 100.0 fL   MCH 28.0 26.0 - 34.0 pg   MCHC 30.4 30.0 - 36.0 g/dL   RDW 13.8 11.5 - 15.5 %   Platelets 346 150 - 400 K/uL   nRBC 0.0 0.0 - 0.2 %   Neutrophils Relative % 64 %   Neutro Abs 8.9 (H) 1.7 - 7.7 K/uL   Lymphocytes Relative 24 %   Lymphs Abs 3.4 0.7 - 4.0 K/uL   Monocytes Relative 10 %   Monocytes Absolute 1.4 (H) 0.1 - 1.0 K/uL   Eosinophils Relative 1 %   Eosinophils Absolute 0.2 0.0 - 0.5 K/uL   Basophils Relative 0 %   Basophils Absolute 0.1 0.0 - 0.1 K/uL   Immature Granulocytes 1 %   Abs Immature Granulocytes 0.15 (H) 0.00 - 0.07 K/uL    Comment: Performed at [facility], [address] [place], [place] [address]  Magnesium     Status: None   Collection Time: 12/28/17  4:36 AM  Result Value Ref Range   Magnesium 2.2 1.7 - 2.4 mg/dL    Comment: Performed at [facility], [address] [place], [place] [address]  Glucose, capillary     Status: Abnormal   Collection Time: 12/28/17  5:04 AM  Result Value Ref Range   Glucose-Capillary 116 (H) 70 - 99 mg/dL  Glucose, capillary     Status: Abnormal   Collection Time: 12/28/17  7:33 AM  Result Value Ref Range   Glucose-Capillary 111 (H) 70 - 99 mg/dL    ABGS Recent Labs    12/27/17 1020  PHART 7.34*  PO2ART 82.9*   HCO3 22.3   CULTURES Recent Results (from the past 240 hour(s))  MRSA PCR Screening     Status: None   Collection Time: 12/23/17 12:20 PM  Result Value Ref Range Status   MRSA by PCR NEGATIVE NEGATIVE Final    Comment:        The GeneXpert MRSA Assay (FDA approved for NASAL specimens only), is one component of a comprehensive MRSA colonization surveillance program. It is not intended to diagnose MRSA infection nor to guide or monitor treatment for MRSA infections. Performed at [facility], [address] [place], [place]  [address]    Studies/Results: Dg Chest Port 1 View  Result Date: 12/28/2017 CLINICAL DATA:  67 year old female with respiratory failure EXAM: PORTABLE CHEST 1 VIEW COMPARISON:  Prior chest x-ray 12/27/2017 FINDINGS: Stable cardiac and mediastinal contours. Atherosclerotic calcifications again noted throughout the transverse aorta. Increasing pulmonary vascular congestion with slightly progressed pulmonary interstitial edema. No evidence of pneumothorax. No acute osseous abnormality. IMPRESSION: Slightly increased pulmonary edema concerning for worsening CHF. Aortic Atherosclerosis (ICD10-170.0) Electronically Signed   By: Jacqulynn Cadet M.D.   On: 12/28/2017 09:15   Dg Chest Port 1 View  Result Date: 12/27/2017 CLINICAL DATA:  History of aspiration EXAM: PORTABLE CHEST 1 VIEW COMPARISON:  12/26/2017 FINDINGS: Cardiac shadow is stable. The lungs are well aerated bilaterally. Increasing central vascular congestion is noted. No sizable infiltrate or effusion is noted. No bony abnormality is seen. IMPRESSION: Increasing central vascular markings. Electronically Signed   By: Inez Catalina M.D.   On: 12/27/2017 06:36    Medications:  Prior to Admission:  Medications Prior to Admission  Medication Sig Dispense Refill Last Dose  . amiodarone (PACERONE) 100 MG tablet Take 1 tablet (100 mg total) by mouth daily. TAKE ONE TABLET BY MOUTH ONCE DAILY 90 tablet 1 12/22/2017  at 0830am  . amLODipine (NORVASC) 10 MG tablet Take 1 tablet (10 mg total) by mouth daily. 30 tablet 1 12/18/2017  . aspirin EC 81 MG EC tablet Take 1 tablet (81 mg total) by mouth daily.   12/22/2017 at Unknown time  . atorvastatin (LIPITOR) 40 MG tablet Take 1 tablet (40 mg total) by mouth daily at 6 PM. 30 tablet 2 12/16/2017  . carvedilol (COREG) 6.25 MG tablet Take 6.25 mg by mouth 2 (two) times daily.  0 12/22/2017 at 0900pm  . cloNIDine (CATAPRES) 0.2 MG tablet Take 0.2 mg by mouth 2 (two) times daily.  0 12/22/2017 at Unknown time  . furosemide (LASIX) 20 MG tablet Take 20 mg by mouth daily.   12/22/2017 at Unknown time  . gabapentin (NEURONTIN) 600 MG tablet Take 1 tablet by mouth 3 (three) times daily.  0 12/22/2017 at Unknown time  . LANTUS 100 UNIT/ML injection Inject 9 Units into the skin daily.   6 12/17/2017 at 0800am  . lisinopril-hydrochlorothiazide (PRINZIDE,ZESTORETIC) 20-25 MG tablet Take 1 tablet by mouth 2 (two) times daily.   12/22/2017 at Unknown time  . pantoprazole (PROTONIX) 40 MG tablet Take 1 tablet (40 mg total) by mouth daily. Take 30 minutes before breakfast 90 tablet 3 12/22/2017 at Unknown time  . potassium chloride (K-DUR) 10 MEQ tablet Take 1 tablet (10 mEq total) by mouth daily. 7 tablet 0 12/16/2017   Scheduled: . amiodarone  100 mg Oral Daily  . amLODipine  5 mg Oral Daily  . aspirin  325 mg Oral Daily   Or  . aspirin  300 mg Rectal Daily  . atorvastatin  80 mg Oral q1800  . carvedilol  6.25 mg Oral BID WC  . cloNIDine  0.1 mg Oral BID  . clopidogrel  75 mg Oral Q breakfast  . clotrimazole  1 Applicatorful Vaginal QHS  . enoxaparin (LOVENOX) injection  30 mg Subcutaneous Q24H  . folic acid  1 mg Oral Daily  . Influenza vac split quadrivalent PF  0.5 mL Intramuscular Tomorrow-1000  . insulin aspart  0-9 Units Subcutaneous Q4H  . mouth rinse  15 mL Mouth Rinse BID  . multivitamin with minerals  1 tablet Oral Daily  . thiamine  100 mg Oral  Daily   Or  .  thiamine  100 mg Intravenous Daily   Continuous: . ampicillin-sulbactam (UNASYN) IV 1.5 g (12/28/17 0754)   MZT:[id number] **OR** acetaminophen (TYLENOL) oral liquid 160 mg/5 mL **OR** acetaminophen, albuterol, hydrALAZINE, LORazepam, morphine injection, nitroGLYCERIN, senna-docusate  Assesment: She was admitted with a stroke.  She has respiratory distress and she had been on BiPAP but she is now oxygenating okay on 2 L.  Her situation is complicated by the fact that she has had the stroke has had trouble with cocaine and alcohol abuse and seems to be in withdrawal.  She has tobacco abuse also and has some withdrawal from nicotine  She has diabetes which is being treated  She has chronic kidney disease which appears fairly stable although it was somewhat worse  She has cirrhosis of the liver at baseline but that does not seem to be an active issue right now Principal Problem:   Acute CVA (cerebrovascular accident) [facility]) Active Problems:   Essential hypertension   PSVT (paroxysmal supraventricular tachycardia) ([place])   Chest pain   Diabetes mellitus, type II ([place])   Tobacco abuse   Cocaine abuse ([place])   Generalized weakness   Hepatic cirrhosis ([place])   GERD (gastroesophageal reflux disease)   Leukocytosis   Hypomagnesemia   Uncontrolled type 2 diabetes mellitus with hyperglycemia, with long-term current use of insulin (HCC)   CKD (chronic kidney disease), stage III (HCC)   Abnormal EKG   Cerebellar stroke ([place])    Plan: Continue conservative treatment.  She is still high risk of needing intubation and mechanical ventilation particularly if she aspirates which has been a concern.  However chest x-ray that I have personally reviewed does not show evidence of pneumonia but instead perhaps some volume overload    LOS: 5 days   Alailah Safley L 12/28/2017, 9:38 AM

## 2017-12-28 NOTE — Progress Notes (Signed)
PROGRESS NOTE  Cassandra Jordan  ZOX:[id number]  DOB: [birth year]  DOA: 12/23/2017 PCP: [facility]   Brief Admission Hx: 67 y.o. female chronic cocaine abuser, smoker, diabetic, with known cerebrovascular and heart disease presented to ED with complaints of a frontal headache and acute CVA.    MDM/Assessment & Plan:   1. Acute CVA - MRI reveals multiple acute infarcts in deep white matter and along the left PICA territory.  Pt is on CVA treatment protocol.  Neuro recommends dual antiplatelet therapy.  Avoid abrupt and rapid drop in blood pressure.  2. 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  We will plan on discussing with vascular surgery when she has stabilized.  Neurology has recommended 30 day event monitor and this is been arranged through cardiology.   3. Dyslipidemia - LDL of 181 - continue atorvastatin 80 mg daily. 4. Prolonged QTc - monitoring on telemetry.  Monitor lytes. Continuous telemetry.  Will repeat EKG 5. Aspiration pneumonia - IV Unasyn started.  NPO for now until she is more awake.   6. Acute respiratory distress -CPAP/BiPAP therapy has been difficult to tolerate.  Pulmonology following.  Currently on nasal cannula. 7. COPD.  She does have some diffuse wheezes.  Continue on bronchodilators. 8. Acute alcohol withdrawal - Pt on CIWA protocol, safety restraints and sitter ordered for safety.  Avoiding precedex due to markedly prolonged QTc.   9. Essential hypertension -blood pressures are running between 190-200.  She is on low-dose clonidine.  Will restart low-dose amlodipine 10. PSVT - cardiology has restarted patient on her home amiodarone and carvedilol.  Heart rate is been stable 11. Type 2 DM - CBG Q4h while NPO plus SSI coverage. A1c is 6.7% which is at ADA goal.  12. Cocaine abuse - Social worker consulted to discuss treatment options with  patient.    DVT prophylaxis: lovenox Code Status: Full  Family Communication: Discussed with daughter Disposition Plan: SNF recommended  Consultants:  Neurology  Cardiology  Pulmonology  Antimicrobials:  Unasyn 12/5>  Subjective: Patient is sleeping in bed, wakes up to voice, but quickly falls back asleep.  Objective: Vitals:   12/28/17 0603 12/28/17 0730 12/28/17 0800 12/28/17 0900  BP: (!) 214/99  (!) 186/98 (!) 194/100  Pulse:    99  Resp:   20   Temp:  (!) 97.4 F (36.3 C)    TempSrc:  Axillary    SpO2:    93%  Weight:      Height:        Intake/Output Summary (Last 24 hours) at 12/28/2017 0935 Last data filed at 12/27/2017 2353 Gross per 24 hour  Intake -  Output 1150 ml  Net -1150 ml   Filed Weights   12/24/17 0455 12/26/17 0328 12/27/17 0500  Weight: 66.3 kg 67.1 kg 68.7 kg   Exam:  General exam: sleeping in bed but wakes up to voice Respiratory system: Mild wheeze bilaterally. Respiratory effort normal. Cardiovascular system:RRR. No murmurs, rubs, gallops. Gastrointestinal system: Abdomen is nondistended, soft and nontender. No organomegaly or masses felt. Normal bowel sounds heard. Central nervous system: Limited exam due to mental status.  Moves all 4 extremities. Extremities: No C/C/E, +pedal pulses Skin: No rashes, lesions or ulcers Psychiatry: Somnolent   Data Reviewed: Basic Metabolic Panel: Recent Labs  Lab 12/24/17 0401 12/25/17 1236 12/26/17 0423 12/27/17 0455 12/28/17 0436  NA 139 138 139 142 144  K 4.6 4.1 4.8 4.9 3.8  CL 110 108 110 113* 113*  CO2 22 21* 22 15* 24  GLUCOSE 127* 120* 135* 96 111*  BUN 23 20 24* 32* 28*  CREATININE 1.62* 1.31* 1.66* 1.69* 1.31*  CALCIUM 8.0* 8.7* 8.4* 8.3* 8.8*  MG 1.8 2.3 2.3 2.5* 2.2   Liver Function Tests: Recent Labs  Lab 12/24/17 0401 12/25/17 1236 12/26/17 0423 12/28/17 0436  AST 24 36 60* 58*  ALT 11 14 19 23   ALKPHOS 41 47 41 39  BILITOT 0.5 0.6 0.6 0.7  PROT 6.6 7.5 7.0  7.1  ALBUMIN 2.4* 2.8* 2.6* 2.6*   No results for input(s): LIPASE, AMYLASE in the last 168 hours. No results for input(s): AMMONIA in the last 168 hours. CBC: Recent Labs  Lab 12/23/17 0755 12/23/17 0757 12/25/17 1236 12/26/17 0423 12/27/17 0615 12/28/17 0436  WBC 11.2*  --  14.6* 12.8* 10.5 14.1*  NEUTROABS 6.8  --  10.3* 9.9* 7.3 8.9*  HGB 12.7 13.9 11.4* 10.5* 14.8 10.4*  HCT 41.3 41.0 36.4 34.9* 48.1* 34.2*  MCV 89.0  --  90.3 93.6 91.6 92.2  PLT 375  --  375 342 211 346   Cardiac Enzymes: Recent Labs  Lab 12/23/17 1211  TROPONINI 0.07*   CBG (last 3)  Recent Labs    12/27/17 2349 12/28/17 0504 12/28/17 0733  GLUCAP 103* 116* 111*   Recent Results (from the past 240 hour(s))  MRSA PCR Screening     Status: None   Collection Time: 12/23/17 12:20 PM  Result Value Ref Range Status   MRSA by PCR NEGATIVE NEGATIVE Final    Comment:        The GeneXpert MRSA Assay (FDA approved for NASAL specimens only), is one component of a comprehensive MRSA colonization surveillance program. It is not intended to diagnose MRSA infection nor to guide or monitor treatment for MRSA infections. Performed at [facility], [address] [place], [place] [address]      Studies: Dg Chest [facility] 1 View  Result Date: 12/28/2017 CLINICAL DATA:  67 year old female with respiratory failure EXAM: PORTABLE CHEST 1 VIEW COMPARISON:  Prior chest x-ray 12/27/2017 FINDINGS: Stable cardiac and mediastinal contours. Atherosclerotic calcifications again noted throughout the transverse aorta. Increasing pulmonary vascular congestion with slightly progressed pulmonary interstitial edema. No evidence of pneumothorax. No acute osseous abnormality. IMPRESSION: Slightly increased pulmonary edema concerning for worsening CHF. Aortic Atherosclerosis (ICD10-170.0) Electronically Signed   By: Jacqulynn Cadet M.D.   On: 12/28/2017 09:15   Dg Chest Port 1 View  Result Date: 12/27/2017 CLINICAL DATA:   History of aspiration EXAM: PORTABLE CHEST 1 VIEW COMPARISON:  12/26/2017 FINDINGS: Cardiac shadow is stable. The lungs are well aerated bilaterally. Increasing central vascular congestion is noted. No sizable infiltrate or effusion is noted. No bony abnormality is seen. IMPRESSION: Increasing central vascular markings. Electronically Signed   By: Inez Catalina M.D.   On: 12/27/2017 06:36   Scheduled Meds: . amiodarone  100 mg Oral Daily  . amLODipine  5 mg Oral Daily  . aspirin  325 mg Oral Daily   Or  . aspirin  300 mg Rectal Daily  . atorvastatin  80 mg Oral q1800  . carvedilol  6.25 mg Oral BID WC  . cloNIDine  0.1 mg Oral BID  . clopidogrel  75 mg Oral Q breakfast  . clotrimazole  1 Applicatorful Vaginal QHS  . enoxaparin (LOVENOX) injection  30 mg Subcutaneous Q24H  . folic acid  1 mg Oral Daily  . Influenza vac split quadrivalent PF  0.5 mL Intramuscular Tomorrow-1000  . insulin aspart  0-9 Units Subcutaneous Q4H  . mouth rinse  15 mL Mouth Rinse BID  . multivitamin with minerals  1 tablet Oral Daily  . thiamine  100 mg Oral Daily   Or  . thiamine  100 mg Intravenous Daily   Continuous Infusions: . ampicillin-sulbactam (UNASYN) IV 1.5 g (12/28/17 0754)    Principal Problem:   Acute CVA (cerebrovascular accident) ([place]) Active Problems:   Essential hypertension   PSVT (paroxysmal supraventricular tachycardia) (HCC)   Chest pain   Diabetes mellitus, type II (HCC)   Tobacco abuse   Cocaine abuse (HCC)   Generalized weakness   Hepatic cirrhosis (HCC)   GERD (gastroesophageal reflux disease)   Leukocytosis   Hypomagnesemia   Uncontrolled type 2 diabetes mellitus with hyperglycemia, with long-term current use of insulin (HCC)   CKD (chronic kidney disease), stage III ([place])   Abnormal EKG   Cerebellar stroke ([place])  Time spent: 35 minutes  Kathie Dike, MD [facility] Pager [phone]  If 7PM-7AM, please contact night-coverage [email or web address] Password  [email or web address] 12/28/2017, 9:35 AM    LOS: 5 days

## 2017-12-29 LAB — CBC WITH DIFFERENTIAL/PLATELET
Abs Immature Granulocytes: 0.12 10*3/uL — ABNORMAL HIGH (ref 0.00–0.07)
BASOS ABS: 0.1 10*3/uL (ref 0.0–0.1)
Basophils Relative: 1 %
Eosinophils Absolute: 0.1 10*3/uL (ref 0.0–0.5)
Eosinophils Relative: 1 %
HCT: 33.2 % — ABNORMAL LOW (ref 36.0–46.0)
Hemoglobin: 10.1 g/dL — ABNORMAL LOW (ref 12.0–15.0)
Immature Granulocytes: 1 %
Lymphocytes Relative: 21 %
Lymphs Abs: 2.3 10*3/uL (ref 0.7–4.0)
MCH: 27.5 pg (ref 26.0–34.0)
MCHC: 30.4 g/dL (ref 30.0–36.0)
MCV: 90.5 fL (ref 80.0–100.0)
Monocytes Absolute: 1 10*3/uL (ref 0.1–1.0)
Monocytes Relative: 9 %
Neutro Abs: 7.2 10*3/uL (ref 1.7–7.7)
Neutrophils Relative %: 67 %
Platelets: 350 10*3/uL (ref 150–400)
RBC: 3.67 MIL/uL — ABNORMAL LOW (ref 3.87–5.11)
RDW: 14.2 % (ref 11.5–15.5)
WBC: 10.7 10*3/uL — ABNORMAL HIGH (ref 4.0–10.5)
nRBC: 0 % (ref 0.0–0.2)

## 2017-12-29 LAB — COMPREHENSIVE METABOLIC PANEL
ALBUMIN: 2.7 g/dL — AB (ref 3.5–5.0)
ALT: 26 U/L (ref 0–44)
AST: 51 U/L — AB (ref 15–41)
Alkaline Phosphatase: 40 U/L (ref 38–126)
Anion gap: 9 (ref 5–15)
BILIRUBIN TOTAL: 0.8 mg/dL (ref 0.3–1.2)
BUN: 26 mg/dL — AB (ref 8–23)
CO2: 23 mmol/L (ref 22–32)
Calcium: 8.6 mg/dL — ABNORMAL LOW (ref 8.9–10.3)
Chloride: 113 mmol/L — ABNORMAL HIGH (ref 98–111)
Creatinine, Ser: 1.28 mg/dL — ABNORMAL HIGH (ref 0.44–1.00)
GFR calc Af Amer: 50 mL/min — ABNORMAL LOW (ref >60)
GFR calc non Af Amer: 44 mL/min — ABNORMAL LOW (ref >60)
GLUCOSE: 105 mg/dL — AB (ref 70–99)
Potassium: 3.9 mmol/L (ref 3.5–5.1)
Sodium: 145 mmol/L (ref 135–145)
Total Protein: 7.3 g/dL (ref 6.5–8.1)

## 2017-12-29 LAB — GLUCOSE, CAPILLARY
GLUCOSE-CAPILLARY: 95 mg/dL (ref 70–99)
Glucose-Capillary: 83 mg/dL (ref 70–99)
Glucose-Capillary: 89 mg/dL (ref 70–99)
Glucose-Capillary: 91 mg/dL (ref 70–99)
Glucose-Capillary: 96 mg/dL (ref 70–99)
Glucose-Capillary: 96 mg/dL (ref 70–99)

## 2017-12-29 LAB — MAGNESIUM: Magnesium: 2 mg/dL (ref 1.7–2.4)

## 2017-12-29 MED ORDER — SODIUM CHLORIDE 0.9 % IV SOLN
1.5000 g | Freq: Four times a day (QID) | INTRAVENOUS | Status: DC
  Administered 2017-12-29 – 2017-12-31 (×9): 1.5 g via INTRAVENOUS
  Filled 2017-12-29 (×13): qty 1.5

## 2017-12-29 NOTE — Progress Notes (Signed)
Subjective: She is still confused and occasionally agitated.  She is more responsive.  She is pulled her oxygen off but is oxygenating at greater than 90% on room air.  Objective: Vital signs in last 24 hours: Temp:  [97.4 F (36.3 C)-97.9 F (36.6 C)] 97.4 F (36.3 C) (12/08 0742) Pulse Rate:  [81-103] 92 (12/08 0742) Resp:  [14-35] 29 (12/08 0400) BP: (155-209)/(82-121) 163/103 (12/08 0700) SpO2:  [89 %-99 %] 94 % (12/08 0742) Weight:  [66.3 kg] 66.3 kg (12/08 0331) Weight change:  Last BM Date: 12/28/17  Intake/Output from previous day: 12/07 0701 - 12/08 0700 In: 266 [I.V.:66; IV Piggyback:200] Out: 875 [Urine:875]  PHYSICAL EXAM General appearance: She is confused sometimes agitated and then falls back to sleep Resp: clear to auscultation bilaterally Cardio: regular rate and rhythm, S1, S2 normal, no murmur, click, rub or gallop GI: soft, non-tender; bowel sounds normal; no masses,  no organomegaly Extremities: extremities normal, atraumatic, no cyanosis or edema  Lab Results:  Results for orders placed or performed during the hospital encounter of 12/23/17 (from the past 48 hour(s))  Blood gas, arterial     Status: Abnormal   Collection Time: 12/27/17 10:20 AM  Result Value Ref Range   O2 Content 3.0 L/min   Delivery systems NASAL CANNULA    pH, Arterial 7.34 (L) 7.350 - 7.450   pCO2 arterial 41.8 32.0 - 48.0 mmHg   pO2, Arterial 82.9 (L) 83.0 - 108.0 mmHg   Bicarbonate 22.3 20.0 - 28.0 mmol/L   Acid-base deficit 2.3 (H) 0.0 - 2.0 mmol/L   O2 Saturation 95.8 %   Patient temperature 37.0    Collection site RIGHT RADIAL    Drawn by [id number]    Sample type ARTERIAL DRAW    Allens test (pass/fail) PASS PASS    Comment: Performed at [facility], [address] [place], [place] [address]  Glucose, capillary     Status: Abnormal   Collection Time: 12/27/17 11:21 AM  Result Value Ref Range   Glucose-Capillary 102 (H) 70 - 99 mg/dL  Glucose, capillary     Status: None    Collection Time: 12/27/17  4:13 PM  Result Value Ref Range   Glucose-Capillary 96 70 - 99 mg/dL  Glucose, capillary     Status: Abnormal   Collection Time: 12/27/17  8:12 PM  Result Value Ref Range   Glucose-Capillary 114 (H) 70 - 99 mg/dL  Glucose, capillary     Status: Abnormal   Collection Time: 12/27/17 11:49 PM  Result Value Ref Range   Glucose-Capillary 103 (H) 70 - 99 mg/dL  Comprehensive metabolic panel     Status: Abnormal   Collection Time: 12/28/17  4:36 AM  Result Value Ref Range   Sodium 144 135 - 145 mmol/L   Potassium 3.8 3.5 - 5.1 mmol/L    Comment: DELTA CHECK NOTED   Chloride 113 (H) 98 - 111 mmol/L   CO2 24 22 - 32 mmol/L   Glucose, Bld 111 (H) 70 - 99 mg/dL   BUN 28 (H) 8 - 23 mg/dL   Creatinine, Ser 1.31 (H) 0.44 - 1.00 mg/dL   Calcium 8.8 (L) 8.9 - 10.3 mg/dL   Total Protein 7.1 6.5 - 8.1 g/dL   Albumin 2.6 (L) 3.5 - 5.0 g/dL   AST 58 (H) 15 - 41 U/L   ALT 23 0 - 44 U/L   Alkaline Phosphatase 39 38 - 126 U/L   Total Bilirubin 0.7 0.3 - 1.2 mg/dL  GFR calc non Af Amer 42 (L) >60 mL/min   GFR calc Af Amer 49 (L) >60 mL/min   Anion gap 7 5 - 15    Comment: Performed at [facility], [address] [place], [place] [address]  CBC with Differential/Platelet     Status: Abnormal   Collection Time: 12/28/17  4:36 AM  Result Value Ref Range   WBC 14.1 (H) 4.0 - 10.5 K/uL   RBC 3.71 (L) 3.87 - 5.11 MIL/uL   Hemoglobin 10.4 (L) 12.0 - 15.0 g/dL    Comment: REPEATED TO VERIFY DELTA CHECK NOTED    HCT 34.2 (L) 36.0 - 46.0 %   MCV 92.2 80.0 - 100.0 fL   MCH 28.0 26.0 - 34.0 pg   MCHC 30.4 30.0 - 36.0 g/dL   RDW 13.8 11.5 - 15.5 %   Platelets 346 150 - 400 K/uL   nRBC 0.0 0.0 - 0.2 %   Neutrophils Relative % 64 %   Neutro Abs 8.9 (H) 1.7 - 7.7 K/uL   Lymphocytes Relative 24 %   Lymphs Abs 3.4 0.7 - 4.0 K/uL   Monocytes Relative 10 %   Monocytes Absolute 1.4 (H) 0.1 - 1.0 K/uL   Eosinophils Relative 1 %   Eosinophils Absolute 0.2 0.0 - 0.5 K/uL    Basophils Relative 0 %   Basophils Absolute 0.1 0.0 - 0.1 K/uL   Immature Granulocytes 1 %   Abs Immature Granulocytes 0.15 (H) 0.00 - 0.07 K/uL    Comment: Performed at [facility], [address], [place], [place] [address]  Magnesium     Status: None   Collection Time: 12/28/17  4:36 AM  Result Value Ref Range   Magnesium 2.2 1.7 - 2.4 mg/dL    Comment: Performed at [facility], [address], [place], [place] [address]  Glucose, capillary     Status: Abnormal   Collection Time: 12/28/17  5:04 AM  Result Value Ref Range   Glucose-Capillary 116 (H) 70 - 99 mg/dL  Glucose, capillary     Status: Abnormal   Collection Time: 12/28/17  7:33 AM  Result Value Ref Range   Glucose-Capillary 111 (H) 70 - 99 mg/dL  Glucose, capillary     Status: Abnormal   Collection Time: 12/28/17 12:03 PM  Result Value Ref Range   Glucose-Capillary 100 (H) 70 - 99 mg/dL  Glucose, capillary     Status: None   Collection Time: 12/28/17  4:04 PM  Result Value Ref Range   Glucose-Capillary 89 70 - 99 mg/dL  Glucose, capillary     Status: None   Collection Time: 12/28/17  7:43 PM  Result Value Ref Range   Glucose-Capillary 88 70 - 99 mg/dL   Comment 1 Notify RN    Comment 2 Document in Chart   Glucose, capillary     Status: None   Collection Time: 12/28/17 11:33 PM  Result Value Ref Range   Glucose-Capillary 94 70 - 99 mg/dL   Comment 1 Notify RN    Comment 2 Document in Chart   Glucose, capillary     Status: None   Collection Time: 12/29/17  3:29 AM  Result Value Ref Range   Glucose-Capillary 95 70 - 99 mg/dL   Comment 1 Notify RN    Comment 2 Document in Chart   Comprehensive metabolic panel     Status: Abnormal   Collection Time: 12/29/17  4:42 AM  Result Value Ref Range   Sodium 145 135 -  145 mmol/L   Potassium 3.9 3.5 - 5.1 mmol/L   Chloride 113 (H) 98 - 111 mmol/L   CO2 23 22 - 32 mmol/L   Glucose, Bld 105 (H) 70 - 99 mg/dL   BUN 26 (H) 8 - 23 mg/dL   Creatinine, Ser 1.28 (H) 0.44 -  1.00 mg/dL   Calcium 8.6 (L) 8.9 - 10.3 mg/dL   Total Protein 7.3 6.5 - 8.1 g/dL   Albumin 2.7 (L) 3.5 - 5.0 g/dL   AST 51 (H) 15 - 41 U/L   ALT 26 0 - 44 U/L   Alkaline Phosphatase 40 38 - 126 U/L   Total Bilirubin 0.8 0.3 - 1.2 mg/dL   GFR calc non Af Amer 44 (L) >60 mL/min   GFR calc Af Amer 50 (L) >60 mL/min   Anion gap 9 5 - 15    Comment: Performed at [facility], [address], [place], [place] [address]  CBC with Differential/Platelet     Status: Abnormal   Collection Time: 12/29/17  4:42 AM  Result Value Ref Range   WBC 10.7 (H) 4.0 - 10.5 K/uL   RBC 3.67 (L) 3.87 - 5.11 MIL/uL   Hemoglobin 10.1 (L) 12.0 - 15.0 g/dL   HCT 33.2 (L) 36.0 - 46.0 %   MCV 90.5 80.0 - 100.0 fL   MCH 27.5 26.0 - 34.0 pg   MCHC 30.4 30.0 - 36.0 g/dL   RDW 14.2 11.5 - 15.5 %   Platelets 350 150 - 400 K/uL   nRBC 0.0 0.0 - 0.2 %   Neutrophils Relative % 67 %   Neutro Abs 7.2 1.7 - 7.7 K/uL   Lymphocytes Relative 21 %   Lymphs Abs 2.3 0.7 - 4.0 K/uL   Monocytes Relative 9 %   Monocytes Absolute 1.0 0.1 - 1.0 K/uL   Eosinophils Relative 1 %   Eosinophils Absolute 0.1 0.0 - 0.5 K/uL   Basophils Relative 1 %   Basophils Absolute 0.1 0.0 - 0.1 K/uL   Immature Granulocytes 1 %   Abs Immature Granulocytes 0.12 (H) 0.00 - 0.07 K/uL    Comment: Performed at [facility], [address], [place], [place] [address]  Magnesium     Status: None   Collection Time: 12/29/17  4:42 AM  Result Value Ref Range   Magnesium 2.0 1.7 - 2.4 mg/dL    Comment: Performed at [facility], [address], [place], [place] [address]  Glucose, capillary     Status: None   Collection Time: 12/29/17  7:40 AM  Result Value Ref Range   Glucose-Capillary 96 70 - 99 mg/dL    ABGS Recent Labs    12/27/17 1020  PHART 7.34*  PO2ART 82.9*  HCO3 22.3   CULTURES Recent Results (from the past 240 hour(s))  MRSA PCR Screening     Status: None   Collection Time: 12/23/17 12:20 PM  Result Value Ref Range Status   MRSA  by PCR NEGATIVE NEGATIVE Final    Comment:        The GeneXpert MRSA Assay (FDA approved for NASAL specimens only), is one component of a comprehensive MRSA colonization surveillance program. It is not intended to diagnose MRSA infection nor to guide or monitor treatment for MRSA infections. Performed at [facility], [address] [place], [place] [address]    Studies/Results: Dg Chest Port 1 View  Result Date: 12/28/2017 CLINICAL DATA:  67 year old female with respiratory failure EXAM: PORTABLE CHEST 1 VIEW COMPARISON:  Prior chest x-ray 12/27/2017 FINDINGS: Stable cardiac and mediastinal contours. Atherosclerotic calcifications again noted throughout the transverse aorta. Increasing pulmonary vascular congestion with slightly progressed pulmonary interstitial edema. No evidence of pneumothorax. No acute osseous abnormality. IMPRESSION: Slightly increased pulmonary edema concerning for worsening CHF. Aortic Atherosclerosis (ICD10-170.0) Electronically Signed   By: Jacqulynn Cadet M.D.   On: 12/28/2017 09:15    Medications:  Prior to Admission:  Medications Prior to Admission  Medication Sig Dispense Refill Last Dose  . amiodarone (PACERONE) 100 MG tablet Take 1 tablet (100 mg total) by mouth daily. TAKE ONE TABLET BY MOUTH ONCE DAILY 90 tablet 1 12/22/2017 at 0830am  . amLODipine (NORVASC) 10 MG tablet Take 1 tablet (10 mg total) by mouth daily. 30 tablet 1 12/18/2017  . aspirin EC 81 MG EC tablet Take 1 tablet (81 mg total) by mouth daily.   12/22/2017 at Unknown time  . atorvastatin (LIPITOR) 40 MG tablet Take 1 tablet (40 mg total) by mouth daily at 6 PM. 30 tablet 2 12/16/2017  . carvedilol (COREG) 6.25 MG tablet Take 6.25 mg by mouth 2 (two) times daily.  0 12/22/2017 at 0900pm  . cloNIDine (CATAPRES) 0.2 MG tablet Take 0.2 mg by mouth 2 (two) times daily.  0 12/22/2017 at Unknown time  . furosemide (LASIX) 20 MG tablet Take 20 mg by mouth daily.   12/22/2017 at Unknown time  .  gabapentin (NEURONTIN) 600 MG tablet Take 1 tablet by mouth 3 (three) times daily.  0 12/22/2017 at Unknown time  . LANTUS 100 UNIT/ML injection Inject 9 Units into the skin daily.   6 12/17/2017 at 0800am  . lisinopril-hydrochlorothiazide (PRINZIDE,ZESTORETIC) 20-25 MG tablet Take 1 tablet by mouth 2 (two) times daily.   12/22/2017 at Unknown time  . pantoprazole (PROTONIX) 40 MG tablet Take 1 tablet (40 mg total) by mouth daily. Take 30 minutes before breakfast 90 tablet 3 12/22/2017 at Unknown time  . potassium chloride (K-DUR) 10 MEQ tablet Take 1 tablet (10 mEq total) by mouth daily. 7 tablet 0 12/16/2017   Scheduled: . amiodarone  100 mg Oral Daily  . amLODipine  5 mg Oral Daily  . aspirin  325 mg Oral Daily   Or  . aspirin  300 mg Rectal Daily  . atorvastatin  80 mg Oral q1800  . carvedilol  6.25 mg Oral BID WC  . cloNIDine  0.1 mg Oral BID  . clopidogrel  75 mg Oral Q breakfast  . clotrimazole  1 Applicatorful Vaginal QHS  . folic acid  1 mg Oral Daily  . Influenza vac split quadrivalent PF  0.5 mL Intramuscular Tomorrow-1000  . insulin aspart  0-9 Units Subcutaneous Q4H  . mouth rinse  15 mL Mouth Rinse BID  . multivitamin with minerals  1 tablet Oral Daily  . thiamine  100 mg Oral Daily   Or  . thiamine  100 mg Intravenous Daily   Continuous: . sodium chloride 10 mL/hr at 12/29/17 0402  . ampicillin-sulbactam (UNASYN) IV     FYT:WKMQKM chloride, acetaminophen **OR** acetaminophen (TYLENOL) oral liquid 160 mg/5 mL **OR** acetaminophen, albuterol, hydrALAZINE, LORazepam, morphine injection, nitroGLYCERIN, senna-docusate  Assesment: She was admitted with a stroke.  She has had trouble with respiratory failure and she was thought to aspirate although that has not shown on chest x-ray.  She required BiPAP but now is much better and she is off oxygen.  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  She  is still agitated at times sleepy at  times.  It is difficult to be certain how severe her stroke he has because of her inability to cooperate with a good exam.  She has multiple other problems including diabetes and cirrhosis and those seem to be stable Principal Problem:   Acute CVA (cerebrovascular accident) [facility]) Active Problems:   Essential hypertension   PSVT (paroxysmal supraventricular tachycardia) (HCC)   Chest pain   Diabetes mellitus, type II ([place])   Tobacco abuse   Cocaine abuse ([place])   Generalized weakness   Hepatic cirrhosis (HCC)   GERD (gastroesophageal reflux disease)   Leukocytosis   Hypomagnesemia   Uncontrolled type 2 diabetes mellitus with hyperglycemia, with long-term current use of insulin (HCC)   CKD (chronic kidney disease), stage III (HCC)   Abnormal EKG   Cerebellar stroke ([place])    Plan: Continue treatments.  Discussed with Dr. Roderic Palau.  Plan to leave her in stepdown 1 more day because of concerns about her pulling out PICC line catheters etc. and losing IV access.    LOS: 6 days   Shakeira Rhee L 12/29/2017, 9:36 AM

## 2017-12-29 NOTE — Progress Notes (Signed)
PROGRESS NOTE  Cassandra Jordan  ZSW:[id number]  DOB: 1950-08-01  DOA: 12/23/2017 PCP: [facility]   Brief Admission Hx: 67 y.o. female chronic cocaine abuser, smoker, diabetic, with known cerebrovascular and heart disease presented to ED with complaints of a frontal headache and acute CVA.    MDM/Assessment & Plan:   1. Acute CVA - MRI reveals multiple acute infarcts in deep white matter and along the left PICA territory.  Pt is on CVA treatment protocol.  Neuro recommends dual antiplatelet therapy.  Avoid abrupt and rapid drop in blood pressure.  2. 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  We will plan on discussing with vascular surgery when she has stabilized.  Neurology has recommended 30 day event monitor and this is been arranged through cardiology.   3. Dyslipidemia - LDL of 181 - continue atorvastatin 80 mg daily. 4. Prolonged QTc - monitoring on telemetry.  Monitor lytes. Continuous telemetry.  Will repeat EKG 5. Aspiration pneumonia - IV Unasyn started.  NPO for now until she is more awake.   6. Acute respiratory distress -briefly required CPAP/BiPAP.  Pulmonology following.  She is now off nasal cannula and has oxygen saturations in the 90s. 7. COPD.  No wheezing at this time.  Continue bronchodilators as needed 8. Acute alcohol withdrawal - Pt on CIWA protocol, safety restraints and sitter ordered for safety.  Avoiding precedex due to markedly prolonged QTc.   9. Essential hypertension -blood pressures remain elevated.  Her p.o. medications are being held since her mental status is still lethargic.  Once mental status improves, can restart oral medications 10. PSVT - cardiology has restarted patient on her home amiodarone and carvedilol.  Unfortunately, these medications have been held due to n.p.o. status.  Will restart them as her mental status  tolerates. 11. Type 2 DM - CBG Q4h while NPO plus SSI coverage. A1c is 6.7% which is at ADA goal.  12. Cocaine abuse - Social worker consulted to discuss treatment options with patient.    DVT prophylaxis: lovenox Code Status: Full  Family Communication: Discussed with daughter Disposition Plan: SNF recommended  Consultants:  Neurology  Cardiology  Pulmonology  Antimicrobials:  Unasyn 12/5>  Subjective: Speech is unintelligible.  She does wake up to voice, but will fall back asleep.  Objective: Vitals:   12/29/17 1000 12/29/17 1100 12/29/17 1126 12/29/17 1200  BP: (!) 147/118     Pulse: 90     Resp:  18  18  Temp:   (!) 97.4 F (36.3 C)   TempSrc:   Axillary   SpO2: 98%     Weight:      Height:        Intake/Output Summary (Last 24 hours) at 12/29/2017 1612 Last data filed at 12/29/2017 0629 Gross per 24 hour  Intake 165.97 ml  Output 575 ml  Net -409.03 ml   Filed Weights   12/26/17 0328 12/27/17 0500 12/29/17 0331  Weight: 67.1 kg 68.7 kg 66.3 kg   Exam:  General exam: Sleeping, wakes up to voice, speech is incoherent Respiratory system: Clear to auscultation. Respiratory effort normal. Cardiovascular system:RRR. No murmurs, rubs, gallops. Gastrointestinal system: Abdomen is nondistended, soft and nontender. No organomegaly or masses felt. Normal bowel sounds heard. Central nervous system: No focal neurological deficits. Extremities: No C/C/E, +pedal pulses Skin: No rashes, lesions or ulcers Psychiatry: Speech is unintelligible    Data Reviewed:  Basic Metabolic Panel: Recent Labs  Lab 12/25/17 1236 12/26/17 0423 12/27/17 0455 12/28/17 0436 12/29/17 0442  NA 138 139 142 144 145  K 4.1 4.8 4.9 3.8 3.9  CL 108 110 113* 113* 113*  CO2 21* 22 15* 24 23  GLUCOSE 120* 135* 96 111* 105*  BUN 20 24* 32* 28* 26*  CREATININE 1.31* 1.66* 1.69* 1.31* 1.28*  CALCIUM 8.7* 8.4* 8.3* 8.8* 8.6*  MG 2.3 2.3 2.5* 2.2 2.0   Liver Function Tests: Recent  Labs  Lab 12/24/17 0401 12/25/17 1236 12/26/17 0423 12/28/17 0436 12/29/17 0442  AST 24 36 60* 58* 51*  ALT 11 14 19 23 26   ALKPHOS 41 47 41 39 40  BILITOT 0.5 0.6 0.6 0.7 0.8  PROT 6.6 7.5 7.0 7.1 7.3  ALBUMIN 2.4* 2.8* 2.6* 2.6* 2.7*   No results for input(s): LIPASE, AMYLASE in the last 168 hours. No results for input(s): AMMONIA in the last 168 hours. CBC: Recent Labs  Lab 12/25/17 1236 12/26/17 0423 12/27/17 0615 12/28/17 0436 12/29/17 0442  WBC 14.6* 12.8* 10.5 14.1* 10.7*  NEUTROABS 10.3* 9.9* 7.3 8.9* 7.2  HGB 11.4* 10.5* 14.8 10.4* 10.1*  HCT 36.4 34.9* 48.1* 34.2* 33.2*  MCV 90.3 93.6 91.6 92.2 90.5  PLT 375 342 211 346 350   Cardiac Enzymes: Recent Labs  Lab 12/23/17 1211  TROPONINI 0.07*   CBG (last 3)  Recent Labs    12/29/17 0329 12/29/17 0740 12/29/17 1133  GLUCAP 95 96 96   Recent Results (from the past 240 hour(s))  MRSA PCR Screening     Status: None   Collection Time: 12/23/17 12:20 PM  Result Value Ref Range Status   MRSA by PCR NEGATIVE NEGATIVE Final    Comment:        The GeneXpert MRSA Assay (FDA approved for NASAL specimens only), is one component of a comprehensive MRSA colonization surveillance program. It is not intended to diagnose MRSA infection nor to guide or monitor treatment for MRSA infections. Performed at [facility], [address], [place], [place] [address]      Studies: Dg Chest [facility] 1 View  Result Date: 12/28/2017 CLINICAL DATA:  67 year old female with respiratory failure EXAM: PORTABLE CHEST 1 VIEW COMPARISON:  Prior chest x-ray 12/27/2017 FINDINGS: Stable cardiac and mediastinal contours. Atherosclerotic calcifications again noted throughout the transverse aorta. Increasing pulmonary vascular congestion with slightly progressed pulmonary interstitial edema. No evidence of pneumothorax. No acute osseous abnormality. IMPRESSION: Slightly increased pulmonary edema concerning for worsening CHF. Aortic  Atherosclerosis (ICD10-170.0) Electronically Signed   By: Jacqulynn Cadet M.D.   On: 12/28/2017 09:15   Scheduled Meds: . amiodarone  100 mg Oral Daily  . amLODipine  5 mg Oral Daily  . aspirin  325 mg Oral Daily   Or  . aspirin  300 mg Rectal Daily  . atorvastatin  80 mg Oral q1800  . carvedilol  6.25 mg Oral BID WC  . cloNIDine  0.1 mg Oral BID  . clopidogrel  75 mg Oral Q breakfast  . clotrimazole  1 Applicatorful Vaginal QHS  . folic acid  1 mg Oral Daily  . Influenza vac split quadrivalent PF  0.5 mL Intramuscular Tomorrow-1000  . insulin aspart  0-9 Units Subcutaneous Q4H  . mouth rinse  15 mL Mouth Rinse BID  . multivitamin with minerals  1 tablet Oral Daily  . thiamine  100 mg Oral Daily   Or  . thiamine  100 mg Intravenous Daily  Continuous Infusions: . sodium chloride 10 mL/hr at 12/29/17 0402  . ampicillin-sulbactam (UNASYN) IV 1.5 g (12/29/17 1540)    Principal Problem:   Acute CVA (cerebrovascular accident) ([place]) Active Problems:   Essential hypertension   PSVT (paroxysmal supraventricular tachycardia) (HCC)   Chest pain   Diabetes mellitus, type II (HCC)   Tobacco abuse   Cocaine abuse (HCC)   Generalized weakness   Hepatic cirrhosis (HCC)   GERD (gastroesophageal reflux disease)   Leukocytosis   Hypomagnesemia   Uncontrolled type 2 diabetes mellitus with hyperglycemia, with long-term current use of insulin (HCC)   CKD (chronic kidney disease), stage III (HCC)   Abnormal EKG   Cerebellar stroke ([place])  Time spent: 35 minutes  Kathie Dike, MD [facility] Pager [phone]  If 7PM-7AM, please contact night-coverage [email or web address] Password [email or web address] 12/29/2017, 4:12 PM    LOS: 6 days

## 2017-12-29 NOTE — Progress Notes (Signed)
Checked on patient about 0530, breath sounds appear clear with no wheezes. Patient had awakened at this time.

## 2017-12-29 NOTE — Progress Notes (Addendum)
Still confused placed back 2 lpm/[place] suffering from stroke , DT , drug addiction. Confuse related from above 3 not sure which or all is causing problems. Patient has not used CPAP for last few days. Even though she wears CPAP 9 at home by sleep. No problems noted with saturation just confusion.

## 2017-12-29 NOTE — Progress Notes (Signed)
Patient has been asleep since early shift on 4 lpm.

## 2017-12-30 LAB — CBC WITH DIFFERENTIAL/PLATELET
Abs Immature Granulocytes: 0.14 10*3/uL — ABNORMAL HIGH (ref 0.00–0.07)
Basophils Absolute: 0.1 10*3/uL (ref 0.0–0.1)
Basophils Relative: 1 %
EOS ABS: 0.1 10*3/uL (ref 0.0–0.5)
Eosinophils Relative: 1 %
HCT: 34.1 % — ABNORMAL LOW (ref 36.0–46.0)
Hemoglobin: 10.4 g/dL — ABNORMAL LOW (ref 12.0–15.0)
IMMATURE GRANULOCYTES: 1 %
Lymphocytes Relative: 27 %
Lymphs Abs: 2.9 10*3/uL (ref 0.7–4.0)
MCH: 27.7 pg (ref 26.0–34.0)
MCHC: 30.5 g/dL (ref 30.0–36.0)
MCV: 90.7 fL (ref 80.0–100.0)
Monocytes Absolute: 1.1 10*3/uL — ABNORMAL HIGH (ref 0.1–1.0)
Monocytes Relative: 10 %
Neutro Abs: 6.6 10*3/uL (ref 1.7–7.7)
Neutrophils Relative %: 60 %
Platelets: 371 10*3/uL (ref 150–400)
RBC: 3.76 MIL/uL — ABNORMAL LOW (ref 3.87–5.11)
RDW: 14.4 % (ref 11.5–15.5)
WBC: 10.9 10*3/uL — ABNORMAL HIGH (ref 4.0–10.5)
nRBC: 0.2 % (ref 0.0–0.2)

## 2017-12-30 LAB — GLUCOSE, CAPILLARY
GLUCOSE-CAPILLARY: 101 mg/dL — AB (ref 70–99)
Glucose-Capillary: 101 mg/dL — ABNORMAL HIGH (ref 70–99)
Glucose-Capillary: 83 mg/dL (ref 70–99)
Glucose-Capillary: 86 mg/dL (ref 70–99)
Glucose-Capillary: 88 mg/dL (ref 70–99)
Glucose-Capillary: 90 mg/dL (ref 70–99)

## 2017-12-30 LAB — COMPREHENSIVE METABOLIC PANEL
ALT: 25 U/L (ref 0–44)
ANION GAP: 11 (ref 5–15)
AST: 45 U/L — ABNORMAL HIGH (ref 15–41)
Albumin: 2.8 g/dL — ABNORMAL LOW (ref 3.5–5.0)
Alkaline Phosphatase: 41 U/L (ref 38–126)
BUN: 24 mg/dL — ABNORMAL HIGH (ref 8–23)
CO2: 23 mmol/L (ref 22–32)
Calcium: 8.9 mg/dL (ref 8.9–10.3)
Chloride: 114 mmol/L — ABNORMAL HIGH (ref 98–111)
Creatinine, Ser: 1.33 mg/dL — ABNORMAL HIGH (ref 0.44–1.00)
GFR calc Af Amer: 48 mL/min — ABNORMAL LOW (ref >60)
GFR calc non Af Amer: 42 mL/min — ABNORMAL LOW (ref >60)
Glucose, Bld: 97 mg/dL (ref 70–99)
Potassium: 3.5 mmol/L (ref 3.5–5.1)
Sodium: 148 mmol/L — ABNORMAL HIGH (ref 135–145)
TOTAL PROTEIN: 7.7 g/dL (ref 6.5–8.1)
Total Bilirubin: 1 mg/dL (ref 0.3–1.2)

## 2017-12-30 LAB — MAGNESIUM: Magnesium: 2 mg/dL (ref 1.7–2.4)

## 2017-12-30 MED ORDER — LORAZEPAM 1 MG PO TABS: 1.0000 mg | ORAL_TABLET | Freq: Four times a day (QID) | ORAL | Status: DC | PRN

## 2017-12-30 MED ORDER — FOLIC ACID 1 MG PO TABS
1.0000 mg | ORAL_TABLET | Freq: Every day | ORAL | Status: DC
  Administered 2018-01-03 – 2018-01-10 (×7): 1 mg via ORAL
  Filled 2017-12-30 (×9): qty 1

## 2017-12-30 MED ORDER — DEXTROSE-NACL 5-0.45 % IV SOLN
INTRAVENOUS | Status: DC
  Administered 2017-12-30: 21:00:00 via INTRAVENOUS

## 2017-12-30 MED ORDER — LORAZEPAM 2 MG/ML IJ SOLN
1.0000 mg | Freq: Four times a day (QID) | INTRAMUSCULAR | Status: DC | PRN
  Administered 2017-12-30 – 2017-12-31 (×3): 1 mg via INTRAVENOUS
  Filled 2017-12-30 (×3): qty 1

## 2017-12-30 NOTE — Progress Notes (Signed)
PROGRESS NOTE  LALA BEEN  WJX:[id number]  DOB: Jan 28, 1950  DOA: 12/23/2017 PCP: [facility]   Brief Admission Hx: 67 y.o. female chronic cocaine abuser, smoker, diabetic, with known cerebrovascular and heart disease presented to ED with complaints of a frontal headache and acute CVA.    MDM/Assessment & Plan:   1. Acute CVA - MRI reveals multiple acute infarcts in deep white matter and along the left PICA territory.  Pt is on CVA treatment protocol.  Neuro recommends dual antiplatelet therapy.  Avoid abrupt and rapid drop in blood pressure.  Managing blood pressure has been challenging since she is been unable to take anything by mouth 2. 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  We will plan on discussing with vascular surgery when she has stabilized.  Neurology has recommended 30 day event monitor and this is been arranged through cardiology.   3. Dyslipidemia - LDL of 181 - continue atorvastatin 80 mg daily. 4. Prolonged QTc - monitoring on telemetry.  Monitor lytes. Continuous telemetry.   5. Aspiration pneumonia - IV Unasyn started.  NPO for now until she is more awake.  Can likely discontinue antibiotics after today's dose 6. Acute respiratory distress -briefly required CPAP/BiPAP.  Pulmonology following.  She is now off nasal cannula and has oxygen saturations in the 90s. 7. COPD.  No wheezing at this time.  Continue bronchodilators as needed 8. Acute alcohol withdrawal -patient is on CIWA protocol.  We will try and minimize Ativan use and she remains lethargic.  Morphine is also been discontinued.   9. Essential hypertension -blood pressures remain elevated.  Her p.o. medications are being held since her mental status is still lethargic.  Once mental status improves, can restart oral medications 10. PSVT - cardiology has restarted patient on her home  amiodarone and carvedilol.  Unfortunately, these medications have been held due to n.p.o. status.  Will restart them as her mental status tolerates. 11. Type 2 DM - CBG Q4h while NPO plus SSI coverage. A1c is 6.7% which is at ADA goal.  12. Cocaine abuse - Social worker consulted to discuss treatment options with patient.   13. Acute encephalopathy.  Unclear how much of this is related to her stroke.  She also has alcohol withdrawal which is complicating the picture.  She has been receiving Ativan and morphine and has remained intermittently lethargic.  Will discontinue morphine and try and reduce Ativan dosing. 14. Hypernatremia.  Related to lack of p.o. intake due to n.p.o. status.  She has not been awake enough to take anything by mouth.  Will start on hypotonic fluids.  DVT prophylaxis: lovenox Code Status: Full  Family Communication: No family present Disposition Plan: SNF recommended  Consultants:  Neurology  Cardiology  Pulmonology  Antimicrobials:  Unasyn 12/5>  Subjective: Does not engage in conversation.  Opens eyes to voice, but falls back asleep  Objective: Vitals:   12/30/17 1200 12/30/17 1600 12/30/17 1622 12/30/17 1700  BP: 106/82 (!) 190/88  (!) 186/106  Pulse: 74 90  87  Resp:    18  Temp:   (!) 97.4 F (36.3 C)   TempSrc:   Oral   SpO2: 97% 91%  96%  Weight:      Height:        Intake/Output Summary (Last 24 hours) at 12/30/2017 1911 Last data filed at 12/30/2017 1622 Gross per 24 hour  Intake  393.15 ml  Output 775 ml  Net -381.85 ml   Filed Weights   12/26/17 0328 12/27/17 0500 12/29/17 0331  Weight: 67.1 kg 68.7 kg 66.3 kg   Exam:  General exam: Lethargic, opens eyes to voice but falls back asleep Respiratory system: Clear to auscultation. Respiratory effort normal. Cardiovascular system:RRR. No murmurs, rubs, gallops. Gastrointestinal system: Abdomen is nondistended, soft and nontender. No organomegaly or masses felt. Normal bowel sounds  heard. Central nervous system:  No focal neurological deficits. Extremities: No C/C/E, +pedal pulses Skin: No rashes, lesions or ulcers Psychiatry: Does not engage in conversation, still lethargic   Data Reviewed: Basic Metabolic Panel: Recent Labs  Lab 12/26/17 0423 12/27/17 0455 12/28/17 0436 12/29/17 0442 12/30/17 0643  NA 139 142 144 145 148*  K 4.8 4.9 3.8 3.9 3.5  CL 110 113* 113* 113* 114*  CO2 22 15* 24 23 23   GLUCOSE 135* 96 111* 105* 97  BUN 24* 32* 28* 26* 24*  CREATININE 1.66* 1.69* 1.31* 1.28* 1.33*  CALCIUM 8.4* 8.3* 8.8* 8.6* 8.9  MG 2.3 2.5* 2.2 2.0 2.0   Liver Function Tests: Recent Labs  Lab 12/25/17 1236 12/26/17 0423 12/28/17 0436 12/29/17 0442 12/30/17 0643  AST 36 60* 58* 51* 45*  ALT 14 19 23 26 25   ALKPHOS 47 41 39 40 41  BILITOT 0.6 0.6 0.7 0.8 1.0  PROT 7.5 7.0 7.1 7.3 7.7  ALBUMIN 2.8* 2.6* 2.6* 2.7* 2.8*   No results for input(s): LIPASE, AMYLASE in the last 168 hours. No results for input(s): AMMONIA in the last 168 hours. CBC: Recent Labs  Lab 12/26/17 0423 12/27/17 0615 12/28/17 0436 12/29/17 0442 12/30/17 0643  WBC 12.8* 10.5 14.1* 10.7* 10.9*  NEUTROABS 9.9* 7.3 8.9* 7.2 6.6  HGB 10.5* 14.8 10.4* 10.1* 10.4*  HCT 34.9* 48.1* 34.2* 33.2* 34.1*  MCV 93.6 91.6 92.2 90.5 90.7  PLT 342 211 346 350 371   Cardiac Enzymes: No results for input(s): CKTOTAL, CKMB, CKMBINDEX, TROPONINI in the last 168 hours. CBG (last 3)  Recent Labs    12/30/17 0744 12/30/17 1141 12/30/17 1604  GLUCAP 101* 90 88   Recent Results (from the past 240 hour(s))  MRSA PCR Screening     Status: None   Collection Time: 12/23/17 12:20 PM  Result Value Ref Range Status   MRSA by PCR NEGATIVE NEGATIVE Final    Comment:        The GeneXpert MRSA Assay (FDA approved for NASAL specimens only), is one component of a comprehensive MRSA colonization surveillance program. It is not intended to diagnose MRSA infection nor to guide or monitor  treatment for MRSA infections. Performed at [facility], [address], [place], [place] [address]      Studies: No results found. Scheduled Meds: . amiodarone  100 mg Oral Daily  . amLODipine  5 mg Oral Daily  . aspirin  325 mg Oral Daily   Or  . aspirin  300 mg Rectal Daily  . atorvastatin  80 mg Oral q1800  . carvedilol  6.25 mg Oral BID WC  . cloNIDine  0.1 mg Oral BID  . clopidogrel  75 mg Oral Q breakfast  . clotrimazole  1 Applicatorful Vaginal QHS  . folic acid  1 mg Oral Daily  . Influenza vac split quadrivalent PF  0.5 mL Intramuscular Tomorrow-1000  . insulin aspart  0-9 Units Subcutaneous Q4H  . mouth rinse  15 mL Mouth Rinse BID  . multivitamin with minerals  1  tablet Oral Daily  . thiamine  100 mg Oral Daily   Or  . thiamine  100 mg Intravenous Daily   Continuous Infusions: . sodium chloride 10 mL/hr at 12/29/17 2015  . ampicillin-sulbactam (UNASYN) IV 1.5 g (12/30/17 1434)    Principal Problem:   Acute CVA (cerebrovascular accident) ([place]) Active Problems:   Essential hypertension   PSVT (paroxysmal supraventricular tachycardia) (HCC)   Chest pain   Diabetes mellitus, type II (HCC)   Tobacco abuse   Cocaine abuse (HCC)   Generalized weakness   Hepatic cirrhosis (HCC)   GERD (gastroesophageal reflux disease)   Leukocytosis   Hypomagnesemia   Uncontrolled type 2 diabetes mellitus with hyperglycemia, with long-term current use of insulin (HCC)   CKD (chronic kidney disease), stage III (HCC)   Abnormal EKG   Cerebellar stroke ([place])  Time spent: 35 minutes  Kathie Dike, MD [facility] Pager [phone]  If 7PM-7AM, please contact night-coverage [email or web address] Password [email or web address] 12/30/2017, 7:11 PM    LOS: 7 days

## 2017-12-30 NOTE — Progress Notes (Signed)
Removed oxygen per MD order. So far patient is maintaining in the mid to high 90's. Will continue to monitor.

## 2017-12-30 NOTE — Care Management Important Message (Signed)
Important Message  Patient Details  Name: Cassandra Jordan MRN: [id number] Date of Birth: 07-08-1950   Medicare Important Message Given:  Yes    Shelda Altes 12/30/2017, 2:38 PM

## 2017-12-30 NOTE — Progress Notes (Signed)
Subjective: She remains confused.  Somewhat agitated at times.  Maintaining her oxygen level on 2 L nasal cannula  Objective: Vital signs in last 24 hours: Temp:  [97.4 F (36.3 C)-98.5 F (36.9 C)] 98.1 F (36.7 C) (12/09 0800) Pulse Rate:  [83-104] 94 (12/09 0800) Resp:  [14-26] 23 (12/09 0800) BP: (147-215)/(81-118) 191/91 (12/09 0800) SpO2:  [92 %-100 %] 100 % (12/09 0800) Weight change:  Last BM Date: 12/28/17  Intake/Output from previous day: 12/08 0701 - 12/09 0700 In: 393.2 [I.V.:93.1; IV Piggyback:300.1] Out: 700 [Urine:700]  PHYSICAL EXAM General appearance: Confused sometimes agitated Resp: rhonchi bilaterally Cardio: regular rate and rhythm, S1, S2 normal, no murmur, click, rub or gallop GI: soft, non-tender; bowel sounds normal; no masses,  no organomegaly Extremities: extremities normal, atraumatic, no cyanosis or edema  Lab Results:  Results for orders placed or performed during the hospital encounter of 12/23/17 (from the past 48 hour(s))  Glucose, capillary     Status: Abnormal   Collection Time: 12/28/17 12:03 PM  Result Value Ref Range   Glucose-Capillary 100 (H) 70 - 99 mg/dL  Glucose, capillary     Status: None   Collection Time: 12/28/17  4:04 PM  Result Value Ref Range   Glucose-Capillary 89 70 - 99 mg/dL  Glucose, capillary     Status: None   Collection Time: 12/28/17  7:43 PM  Result Value Ref Range   Glucose-Capillary 88 70 - 99 mg/dL   Comment 1 Notify RN    Comment 2 Document in Chart   Glucose, capillary     Status: None   Collection Time: 12/28/17 11:33 PM  Result Value Ref Range   Glucose-Capillary 94 70 - 99 mg/dL   Comment 1 Notify RN    Comment 2 Document in Chart   Glucose, capillary     Status: None   Collection Time: 12/29/17  3:29 AM  Result Value Ref Range   Glucose-Capillary 95 70 - 99 mg/dL   Comment 1 Notify RN    Comment 2 Document in Chart   Comprehensive metabolic panel     Status: Abnormal   Collection Time:  12/29/17  4:42 AM  Result Value Ref Range   Sodium 145 135 - 145 mmol/L   Potassium 3.9 3.5 - 5.1 mmol/L   Chloride 113 (H) 98 - 111 mmol/L   CO2 23 22 - 32 mmol/L   Glucose, Bld 105 (H) 70 - 99 mg/dL   BUN 26 (H) 8 - 23 mg/dL   Creatinine, Ser 1.28 (H) 0.44 - 1.00 mg/dL   Calcium 8.6 (L) 8.9 - 10.3 mg/dL   Total Protein 7.3 6.5 - 8.1 g/dL   Albumin 2.7 (L) 3.5 - 5.0 g/dL   AST 51 (H) 15 - 41 U/L   ALT 26 0 - 44 U/L   Alkaline Phosphatase 40 38 - 126 U/L   Total Bilirubin 0.8 0.3 - 1.2 mg/dL   GFR calc non Af Amer 44 (L) >60 mL/min   GFR calc Af Amer 50 (L) >60 mL/min   Anion gap 9 5 - 15    Comment: Performed at [facility], [address] [place], [place] [address]  CBC with Differential/Platelet     Status: Abnormal   Collection Time: 12/29/17  4:42 AM  Result Value Ref Range   WBC 10.7 (H) 4.0 - 10.5 K/uL   RBC 3.67 (L) 3.87 - 5.11 MIL/uL   Hemoglobin 10.1 (L) 12.0 - 15.0 g/dL   HCT 33.2 (L)  36.0 - 46.0 %   MCV 90.5 80.0 - 100.0 fL   MCH 27.5 26.0 - 34.0 pg   MCHC 30.4 30.0 - 36.0 g/dL   RDW 14.2 11.5 - 15.5 %   Platelets 350 150 - 400 K/uL   nRBC 0.0 0.0 - 0.2 %   Neutrophils Relative % 67 %   Neutro Abs 7.2 1.7 - 7.7 K/uL   Lymphocytes Relative 21 %   Lymphs Abs 2.3 0.7 - 4.0 K/uL   Monocytes Relative 9 %   Monocytes Absolute 1.0 0.1 - 1.0 K/uL   Eosinophils Relative 1 %   Eosinophils Absolute 0.1 0.0 - 0.5 K/uL   Basophils Relative 1 %   Basophils Absolute 0.1 0.0 - 0.1 K/uL   Immature Granulocytes 1 %   Abs Immature Granulocytes 0.12 (H) 0.00 - 0.07 K/uL    Comment: Performed at [facility], [address], [place], [place] [address]  Magnesium     Status: None   Collection Time: 12/29/17  4:42 AM  Result Value Ref Range   Magnesium 2.0 1.7 - 2.4 mg/dL    Comment: Performed at [facility], [address], [place], [place] [address]  Glucose, capillary     Status: None   Collection Time: 12/29/17  7:40 AM  Result Value Ref Range   Glucose-Capillary 96 70  - 99 mg/dL  Glucose, capillary     Status: None   Collection Time: 12/29/17 11:33 AM  Result Value Ref Range   Glucose-Capillary 96 70 - 99 mg/dL  Glucose, capillary     Status: None   Collection Time: 12/29/17  4:35 PM  Result Value Ref Range   Glucose-Capillary 91 70 - 99 mg/dL  Glucose, capillary     Status: None   Collection Time: 12/29/17  7:45 PM  Result Value Ref Range   Glucose-Capillary 89 70 - 99 mg/dL  Glucose, capillary     Status: None   Collection Time: 12/29/17 11:45 PM  Result Value Ref Range   Glucose-Capillary 83 70 - 99 mg/dL  Glucose, capillary     Status: None   Collection Time: 12/30/17  3:20 AM  Result Value Ref Range   Glucose-Capillary 86 70 - 99 mg/dL  Comprehensive metabolic panel     Status: Abnormal   Collection Time: 12/30/17  6:43 AM  Result Value Ref Range   Sodium 148 (H) 135 - 145 mmol/L   Potassium 3.5 3.5 - 5.1 mmol/L   Chloride 114 (H) 98 - 111 mmol/L   CO2 23 22 - 32 mmol/L   Glucose, Bld 97 70 - 99 mg/dL   BUN 24 (H) 8 - 23 mg/dL   Creatinine, Ser 1.33 (H) 0.44 - 1.00 mg/dL   Calcium 8.9 8.9 - 10.3 mg/dL   Total Protein 7.7 6.5 - 8.1 g/dL   Albumin 2.8 (L) 3.5 - 5.0 g/dL   AST 45 (H) 15 - 41 U/L   ALT 25 0 - 44 U/L   Alkaline Phosphatase 41 38 - 126 U/L   Total Bilirubin 1.0 0.3 - 1.2 mg/dL   GFR calc non Af Amer 42 (L) >60 mL/min   GFR calc Af Amer 48 (L) >60 mL/min   Anion gap 11 5 - 15    Comment: Performed at [facility], [address] [place], [place] [address]  CBC with Differential/Platelet     Status: Abnormal   Collection Time: 12/30/17  6:43 AM  Result Value Ref Range   WBC 10.9 (  H) 4.0 - 10.5 K/uL   RBC 3.76 (L) 3.87 - 5.11 MIL/uL   Hemoglobin 10.4 (L) 12.0 - 15.0 g/dL   HCT 34.1 (L) 36.0 - 46.0 %   MCV 90.7 80.0 - 100.0 fL   MCH 27.7 26.0 - 34.0 pg   MCHC 30.5 30.0 - 36.0 g/dL   RDW 14.4 11.5 - 15.5 %   Platelets 371 150 - 400 K/uL   nRBC 0.2 0.0 - 0.2 %   Neutrophils Relative % 60 %   Neutro Abs 6.6 1.7 -  7.7 K/uL   Lymphocytes Relative 27 %   Lymphs Abs 2.9 0.7 - 4.0 K/uL   Monocytes Relative 10 %   Monocytes Absolute 1.1 (H) 0.1 - 1.0 K/uL   Eosinophils Relative 1 %   Eosinophils Absolute 0.1 0.0 - 0.5 K/uL   Basophils Relative 1 %   Basophils Absolute 0.1 0.0 - 0.1 K/uL   Immature Granulocytes 1 %   Abs Immature Granulocytes 0.14 (H) 0.00 - 0.07 K/uL    Comment: Performed at [facility], [address] [place], [place] [address]  Magnesium     Status: None   Collection Time: 12/30/17  6:43 AM  Result Value Ref Range   Magnesium 2.0 1.7 - 2.4 mg/dL    Comment: Performed at [facility], [address], [place], [place] [address]  Glucose, capillary     Status: Abnormal   Collection Time: 12/30/17  7:44 AM  Result Value Ref Range   Glucose-Capillary 101 (H) 70 - 99 mg/dL    ABGS Recent Labs    12/27/17 1020  PHART 7.34*  PO2ART 82.9*  HCO3 22.3   CULTURES Recent Results (from the past 240 hour(s))  MRSA PCR Screening     Status: None   Collection Time: 12/23/17 12:20 PM  Result Value Ref Range Status   MRSA by PCR NEGATIVE NEGATIVE Final    Comment:        The GeneXpert MRSA Assay (FDA approved for NASAL specimens only), is one component of a comprehensive MRSA colonization surveillance program. It is not intended to diagnose MRSA infection nor to guide or monitor treatment for MRSA infections. Performed at [facility], [address] [place], [place] [address]    Studies/Results: No results found.  Medications:  Prior to Admission:  Medications Prior to Admission  Medication Sig Dispense Refill Last Dose  . amiodarone (PACERONE) 100 MG tablet Take 1 tablet (100 mg total) by mouth daily. TAKE ONE TABLET BY MOUTH ONCE DAILY 90 tablet 1 12/22/2017 at 0830am  . amLODipine (NORVASC) 10 MG tablet Take 1 tablet (10 mg total) by mouth daily. 30 tablet 1 12/18/2017  . aspirin EC 81 MG EC tablet Take 1 tablet (81 mg total) by mouth daily.   12/22/2017 at Unknown time   . atorvastatin (LIPITOR) 40 MG tablet Take 1 tablet (40 mg total) by mouth daily at 6 PM. 30 tablet 2 12/16/2017  . carvedilol (COREG) 6.25 MG tablet Take 6.25 mg by mouth 2 (two) times daily.  0 12/22/2017 at 0900pm  . cloNIDine (CATAPRES) 0.2 MG tablet Take 0.2 mg by mouth 2 (two) times daily.  0 12/22/2017 at Unknown time  . furosemide (LASIX) 20 MG tablet Take 20 mg by mouth daily.   12/22/2017 at Unknown time  . gabapentin (NEURONTIN) 600 MG tablet Take 1 tablet by mouth 3 (three) times daily.  0 12/22/2017 at Unknown time  . LANTUS 100 UNIT/ML injection Inject 9 Units into  the skin daily.   6 12/17/2017 at 0800am  . lisinopril-hydrochlorothiazide (PRINZIDE,ZESTORETIC) 20-25 MG tablet Take 1 tablet by mouth 2 (two) times daily.   12/22/2017 at Unknown time  . pantoprazole (PROTONIX) 40 MG tablet Take 1 tablet (40 mg total) by mouth daily. Take 30 minutes before breakfast 90 tablet 3 12/22/2017 at Unknown time  . potassium chloride (K-DUR) 10 MEQ tablet Take 1 tablet (10 mEq total) by mouth daily. 7 tablet 0 12/16/2017   Scheduled: . amiodarone  100 mg Oral Daily  . amLODipine  5 mg Oral Daily  . aspirin  325 mg Oral Daily   Or  . aspirin  300 mg Rectal Daily  . atorvastatin  80 mg Oral q1800  . carvedilol  6.25 mg Oral BID WC  . cloNIDine  0.1 mg Oral BID  . clopidogrel  75 mg Oral Q breakfast  . clotrimazole  1 Applicatorful Vaginal QHS  . folic acid  1 mg Oral Daily  . Influenza vac split quadrivalent PF  0.5 mL Intramuscular Tomorrow-1000  . insulin aspart  0-9 Units Subcutaneous Q4H  . mouth rinse  15 mL Mouth Rinse BID  . multivitamin with minerals  1 tablet Oral Daily  . thiamine  100 mg Oral Daily   Or  . thiamine  100 mg Intravenous Daily   Continuous: . sodium chloride 10 mL/hr at 12/29/17 2015  . ampicillin-sulbactam (UNASYN) IV 1.5 g (12/30/17 0801)   PJK:DTOIZT chloride, acetaminophen **OR** acetaminophen (TYLENOL) oral liquid 160 mg/5 mL **OR** acetaminophen,  albuterol, hydrALAZINE, LORazepam, morphine injection, nitroGLYCERIN, senna-docusate  Assesment: She was admitted with an acute stroke.  This is been complicated by history of cocaine abuse and alcohol abuse and she seems to be having withdrawal from both.  She has arterial disease in her carotid artery that may need surgical intervention but she is not a candidate that at this point.  She remains agitated and confused at times.  She had acute respiratory failure that required BiPAP which she did not tolerate well.  She is now able to maintain her oxygenation on 2 L oxygen.  At home she apparently wears CPAP but has not needed it and she will not keep the mask on now.  I was concerned that she had aspiration pneumonia but we cannot prove that.  She was previously very high risk of needing intubation and mechanical ventilation and she is still high risk but has improved Principal Problem:   Acute CVA (cerebrovascular accident) [facility]) Active Problems:   Essential hypertension   PSVT (paroxysmal supraventricular tachycardia) (HCC)   Chest pain   Diabetes mellitus, type II (HCC)   Tobacco abuse   Cocaine abuse ([place])   Generalized weakness   Hepatic cirrhosis (HCC)   GERD (gastroesophageal reflux disease)   Leukocytosis   Hypomagnesemia   Uncontrolled type 2 diabetes mellitus with hyperglycemia, with long-term current use of insulin (HCC)   CKD (chronic kidney disease), stage III (HCC)   Abnormal EKG   Cerebellar stroke ([place])    Plan: Continue treatments    LOS: 7 days   Sophonie Goforth L 12/30/2017, 8:27 AM

## 2017-12-31 LAB — COMPREHENSIVE METABOLIC PANEL
ALT: 24 U/L (ref 0–44)
AST: 42 U/L — ABNORMAL HIGH (ref 15–41)
Albumin: 2.7 g/dL — ABNORMAL LOW (ref 3.5–5.0)
Alkaline Phosphatase: 39 U/L (ref 38–126)
Anion gap: 10 (ref 5–15)
BUN: 24 mg/dL — ABNORMAL HIGH (ref 8–23)
CHLORIDE: 115 mmol/L — AB (ref 98–111)
CO2: 24 mmol/L (ref 22–32)
Calcium: 8.5 mg/dL — ABNORMAL LOW (ref 8.9–10.3)
Creatinine, Ser: 1.45 mg/dL — ABNORMAL HIGH (ref 0.44–1.00)
GFR calc Af Amer: 43 mL/min — ABNORMAL LOW (ref >60)
GFR calc non Af Amer: 37 mL/min — ABNORMAL LOW (ref >60)
Glucose, Bld: 114 mg/dL — ABNORMAL HIGH (ref 70–99)
POTASSIUM: 3.6 mmol/L (ref 3.5–5.1)
Sodium: 149 mmol/L — ABNORMAL HIGH (ref 135–145)
Total Bilirubin: 0.8 mg/dL (ref 0.3–1.2)
Total Protein: 7.4 g/dL (ref 6.5–8.1)

## 2017-12-31 LAB — GLUCOSE, CAPILLARY
GLUCOSE-CAPILLARY: 116 mg/dL — AB (ref 70–99)
Glucose-Capillary: 128 mg/dL — ABNORMAL HIGH (ref 70–99)
Glucose-Capillary: 123 mg/dL — ABNORMAL HIGH (ref 70–99)
Glucose-Capillary: 144 mg/dL — ABNORMAL HIGH (ref 70–99)
Glucose-Capillary: 126 mg/dL — ABNORMAL HIGH (ref 70–99)

## 2017-12-31 LAB — CBC WITH DIFFERENTIAL/PLATELET
ABS IMMATURE GRANULOCYTES: 0.15 10*3/uL — AB (ref 0.00–0.07)
Basophils Absolute: 0.1 10*3/uL (ref 0.0–0.1)
Basophils Relative: 1 %
Eosinophils Absolute: 0.1 10*3/uL (ref 0.0–0.5)
Eosinophils Relative: 1 %
HCT: 33.2 % — ABNORMAL LOW (ref 36.0–46.0)
Hemoglobin: 10 g/dL — ABNORMAL LOW (ref 12.0–15.0)
Immature Granulocytes: 2 %
Lymphocytes Relative: 30 %
Lymphs Abs: 3 10*3/uL (ref 0.7–4.0)
MCH: 27.4 pg (ref 26.0–34.0)
MCHC: 30.1 g/dL (ref 30.0–36.0)
MCV: 91 fL (ref 80.0–100.0)
Monocytes Absolute: 1 10*3/uL (ref 0.1–1.0)
Monocytes Relative: 10 %
NEUTROS ABS: 5.8 10*3/uL (ref 1.7–7.7)
NEUTROS PCT: 56 %
Platelets: 356 10*3/uL (ref 150–400)
RBC: 3.65 MIL/uL — AB (ref 3.87–5.11)
RDW: 14 % (ref 11.5–15.5)
WBC: 10.2 10*3/uL (ref 4.0–10.5)
nRBC: 0.2 % (ref 0.0–0.2)

## 2017-12-31 MED ORDER — POTASSIUM CL IN DEXTROSE 5% 20 MEQ/L IV SOLN
20.0000 meq | INTRAVENOUS | Status: DC
  Administered 2017-12-31 – 2018-01-01 (×5): 20 meq via INTRAVENOUS
  Filled 2017-12-31 (×7): qty 1000

## 2017-12-31 MED ORDER — ENOXAPARIN SODIUM 40 MG/0.4ML ~~LOC~~ SOLN
40.0000 mg | SUBCUTANEOUS | Status: DC
  Administered 2017-12-31 – 2018-01-08 (×9): 40 mg via SUBCUTANEOUS
  Filled 2017-12-31 (×10): qty 0.4

## 2017-12-31 NOTE — Progress Notes (Signed)
Subjective: She is more alert but still very confused and mildly agitated.  Objective: Vital signs in last 24 hours: Temp:  [97.4 F (36.3 C)-98.5 F (36.9 C)] 98.2 F (36.8 C) (12/10 0400) Pulse Rate:  [74-98] 90 (12/10 0700) Resp:  [17-26] 17 (12/10 0700) BP: (106-198)/(70-124) 185/94 (12/10 0700) SpO2:  [91 %-100 %] 95 % (12/10 0700) Weight:  [65.8 kg] 65.8 kg (12/10 0500) Weight change:  Last BM Date: 12/28/17  Intake/Output from previous day: 12/09 0701 - 12/10 0700 In: 850.2 [I.V.:450.2; IV Piggyback:400] Out: 975 [Urine:975]  PHYSICAL EXAM General appearance: alert and Agitated, confused Resp: rhonchi bilaterally Cardio: regular rate and rhythm, S1, S2 normal, no murmur, click, rub or gallop GI: soft, non-tender; bowel sounds normal; no masses,  no organomegaly Extremities: extremities normal, atraumatic, no cyanosis or edema  Lab Results:  Results for orders placed or performed during the hospital encounter of 12/23/17 (from the past 48 hour(s))  Glucose, capillary     Status: None   Collection Time: 12/29/17 11:33 AM  Result Value Ref Range   Glucose-Capillary 96 70 - 99 mg/dL  Glucose, capillary     Status: None   Collection Time: 12/29/17  4:35 PM  Result Value Ref Range   Glucose-Capillary 91 70 - 99 mg/dL  Glucose, capillary     Status: None   Collection Time: 12/29/17  7:45 PM  Result Value Ref Range   Glucose-Capillary 89 70 - 99 mg/dL  Glucose, capillary     Status: None   Collection Time: 12/29/17 11:45 PM  Result Value Ref Range   Glucose-Capillary 83 70 - 99 mg/dL  Glucose, capillary     Status: None   Collection Time: 12/30/17  3:20 AM  Result Value Ref Range   Glucose-Capillary 86 70 - 99 mg/dL  Comprehensive metabolic panel     Status: Abnormal   Collection Time: 12/30/17  6:43 AM  Result Value Ref Range   Sodium 148 (H) 135 - 145 mmol/L   Potassium 3.5 3.5 - 5.1 mmol/L   Chloride 114 (H) 98 - 111 mmol/L   CO2 23 22 - 32 mmol/L    Glucose, Bld 97 70 - 99 mg/dL   BUN 24 (H) 8 - 23 mg/dL   Creatinine, Ser 1.33 (H) 0.44 - 1.00 mg/dL   Calcium 8.9 8.9 - 10.3 mg/dL   Total Protein 7.7 6.5 - 8.1 g/dL   Albumin 2.8 (L) 3.5 - 5.0 g/dL   AST 45 (H) 15 - 41 U/L   ALT 25 0 - 44 U/L   Alkaline Phosphatase 41 38 - 126 U/L   Total Bilirubin 1.0 0.3 - 1.2 mg/dL   GFR calc non Af Amer 42 (L) >60 mL/min   GFR calc Af Amer 48 (L) >60 mL/min   Anion gap 11 5 - 15    Comment: Performed at [facility], [address], [place], [place] [address]  CBC with Differential/Platelet     Status: Abnormal   Collection Time: 12/30/17  6:43 AM  Result Value Ref Range   WBC 10.9 (H) 4.0 - 10.5 K/uL   RBC 3.76 (L) 3.87 - 5.11 MIL/uL   Hemoglobin 10.4 (L) 12.0 - 15.0 g/dL   HCT 34.1 (L) 36.0 - 46.0 %   MCV 90.7 80.0 - 100.0 fL   MCH 27.7 26.0 - 34.0 pg   MCHC 30.5 30.0 - 36.0 g/dL   RDW 14.4 11.5 - 15.5 %   Platelets 371 150 - 400 K/uL  nRBC 0.2 0.0 - 0.2 %   Neutrophils Relative % 60 %   Neutro Abs 6.6 1.7 - 7.7 K/uL   Lymphocytes Relative 27 %   Lymphs Abs 2.9 0.7 - 4.0 K/uL   Monocytes Relative 10 %   Monocytes Absolute 1.1 (H) 0.1 - 1.0 K/uL   Eosinophils Relative 1 %   Eosinophils Absolute 0.1 0.0 - 0.5 K/uL   Basophils Relative 1 %   Basophils Absolute 0.1 0.0 - 0.1 K/uL   Immature Granulocytes 1 %   Abs Immature Granulocytes 0.14 (H) 0.00 - 0.07 K/uL    Comment: Performed at [facility], [address] [place], [place] [address]  Magnesium     Status: None   Collection Time: 12/30/17  6:43 AM  Result Value Ref Range   Magnesium 2.0 1.7 - 2.4 mg/dL    Comment: Performed at [facility], [address], [place], [place] [address]  Glucose, capillary     Status: Abnormal   Collection Time: 12/30/17  7:44 AM  Result Value Ref Range   Glucose-Capillary 101 (H) 70 - 99 mg/dL  Glucose, capillary     Status: None   Collection Time: 12/30/17 11:41 AM  Result Value Ref Range   Glucose-Capillary 90 70 - 99 mg/dL  Glucose,  capillary     Status: None   Collection Time: 12/30/17  4:04 PM  Result Value Ref Range   Glucose-Capillary 88 70 - 99 mg/dL  Glucose, capillary     Status: None   Collection Time: 12/30/17  7:54 PM  Result Value Ref Range   Glucose-Capillary 83 70 - 99 mg/dL  Glucose, capillary     Status: Abnormal   Collection Time: 12/30/17 11:33 PM  Result Value Ref Range   Glucose-Capillary 101 (H) 70 - 99 mg/dL  Glucose, capillary     Status: Abnormal   Collection Time: 12/31/17  4:04 AM  Result Value Ref Range   Glucose-Capillary 116 (H) 70 - 99 mg/dL  Comprehensive metabolic panel     Status: Abnormal   Collection Time: 12/31/17  4:18 AM  Result Value Ref Range   Sodium 149 (H) 135 - 145 mmol/L   Potassium 3.6 3.5 - 5.1 mmol/L   Chloride 115 (H) 98 - 111 mmol/L   CO2 24 22 - 32 mmol/L   Glucose, Bld 114 (H) 70 - 99 mg/dL   BUN 24 (H) 8 - 23 mg/dL   Creatinine, Ser 1.45 (H) 0.44 - 1.00 mg/dL   Calcium 8.5 (L) 8.9 - 10.3 mg/dL   Total Protein 7.4 6.5 - 8.1 g/dL   Albumin 2.7 (L) 3.5 - 5.0 g/dL   AST 42 (H) 15 - 41 U/L   ALT 24 0 - 44 U/L   Alkaline Phosphatase 39 38 - 126 U/L   Total Bilirubin 0.8 0.3 - 1.2 mg/dL   GFR calc non Af Amer 37 (L) >60 mL/min   GFR calc Af Amer 43 (L) >60 mL/min   Anion gap 10 5 - 15    Comment: Performed at [facility], [address] [place], [place] [address]  CBC with Differential/Platelet     Status: Abnormal   Collection Time: 12/31/17  4:18 AM  Result Value Ref Range   WBC 10.2 4.0 - 10.5 K/uL   RBC 3.65 (L) 3.87 - 5.11 MIL/uL   Hemoglobin 10.0 (L) 12.0 - 15.0 g/dL   HCT 33.2 (L) 36.0 - 46.0 %   MCV 91.0 80.0 - 100.0 fL  MCH 27.4 26.0 - 34.0 pg   MCHC 30.1 30.0 - 36.0 g/dL   RDW 14.0 11.5 - 15.5 %   Platelets 356 150 - 400 K/uL   nRBC 0.2 0.0 - 0.2 %   Neutrophils Relative % 56 %   Neutro Abs 5.8 1.7 - 7.7 K/uL   Lymphocytes Relative 30 %   Lymphs Abs 3.0 0.7 - 4.0 K/uL   Monocytes Relative 10 %   Monocytes Absolute 1.0 0.1 - 1.0 K/uL    Eosinophils Relative 1 %   Eosinophils Absolute 0.1 0.0 - 0.5 K/uL   Basophils Relative 1 %   Basophils Absolute 0.1 0.0 - 0.1 K/uL   Immature Granulocytes 2 %   Abs Immature Granulocytes 0.15 (H) 0.00 - 0.07 K/uL    Comment: Performed at [facility], [address], [place], [place] [address]    ABGS No results for input(s): PHART, PO2ART, TCO2, HCO3 in the last 72 hours.  Invalid input(s): PCO2 CULTURES Recent Results (from the past 240 hour(s))  MRSA PCR Screening     Status: None   Collection Time: 12/23/17 12:20 PM  Result Value Ref Range Status   MRSA by PCR NEGATIVE NEGATIVE Final    Comment:        The GeneXpert MRSA Assay (FDA approved for NASAL specimens only), is one component of a comprehensive MRSA colonization surveillance program. It is not intended to diagnose MRSA infection nor to guide or monitor treatment for MRSA infections. Performed at [facility], [address] [place], [place] [address]    Studies/Results: No results found.  Medications:  Prior to Admission:  Medications Prior to Admission  Medication Sig Dispense Refill Last Dose  . amiodarone (PACERONE) 100 MG tablet Take 1 tablet (100 mg total) by mouth daily. TAKE ONE TABLET BY MOUTH ONCE DAILY 90 tablet 1 12/22/2017 at 0830am  . amLODipine (NORVASC) 10 MG tablet Take 1 tablet (10 mg total) by mouth daily. 30 tablet 1 12/18/2017  . aspirin EC 81 MG EC tablet Take 1 tablet (81 mg total) by mouth daily.   12/22/2017 at Unknown time  . atorvastatin (LIPITOR) 40 MG tablet Take 1 tablet (40 mg total) by mouth daily at 6 PM. 30 tablet 2 12/16/2017  . carvedilol (COREG) 6.25 MG tablet Take 6.25 mg by mouth 2 (two) times daily.  0 12/22/2017 at 0900pm  . cloNIDine (CATAPRES) 0.2 MG tablet Take 0.2 mg by mouth 2 (two) times daily.  0 12/22/2017 at Unknown time  . furosemide (LASIX) 20 MG tablet Take 20 mg by mouth daily.   12/22/2017 at Unknown time  . gabapentin (NEURONTIN) 600 MG tablet Take 1 tablet  by mouth 3 (three) times daily.  0 12/22/2017 at Unknown time  . LANTUS 100 UNIT/ML injection Inject 9 Units into the skin daily.   6 12/17/2017 at 0800am  . lisinopril-hydrochlorothiazide (PRINZIDE,ZESTORETIC) 20-25 MG tablet Take 1 tablet by mouth 2 (two) times daily.   12/22/2017 at Unknown time  . pantoprazole (PROTONIX) 40 MG tablet Take 1 tablet (40 mg total) by mouth daily. Take 30 minutes before breakfast 90 tablet 3 12/22/2017 at Unknown time  . potassium chloride (K-DUR) 10 MEQ tablet Take 1 tablet (10 mEq total) by mouth daily. 7 tablet 0 12/16/2017   Scheduled: . amiodarone  100 mg Oral Daily  . amLODipine  5 mg Oral Daily  . aspirin  325 mg Oral Daily   Or  . aspirin  300 mg Rectal Daily  .  atorvastatin  80 mg Oral q1800  . carvedilol  6.25 mg Oral BID WC  . cloNIDine  0.1 mg Oral BID  . clopidogrel  75 mg Oral Q breakfast  . clotrimazole  1 Applicatorful Vaginal QHS  . folic acid  1 mg Oral Daily  . Influenza vac split quadrivalent PF  0.5 mL Intramuscular Tomorrow-1000  . insulin aspart  0-9 Units Subcutaneous Q4H  . mouth rinse  15 mL Mouth Rinse BID  . multivitamin with minerals  1 tablet Oral Daily  . thiamine  100 mg Oral Daily   Or  . thiamine  100 mg Intravenous Daily   Continuous: . sodium chloride 10 mL/hr at 12/29/17 2015  . ampicillin-sulbactam (UNASYN) IV Stopped (12/31/17 0255)  . dextrose 5 % and 0.45% NaCl 75 mL/hr at 12/31/17 0400   SJG:GEZMOQ chloride, acetaminophen **OR** acetaminophen (TYLENOL) oral liquid 160 mg/5 mL **OR** acetaminophen, albuterol, hydrALAZINE, LORazepam **OR** LORazepam, nitroGLYCERIN, senna-docusate  Assesment: She has had a stroke.  She has had some respiratory distress that was thought to be due to aspiration and she is being treated for that.  No definite pneumonia on x-ray.  She had been on BiPAP but now is on nasal cannula and maintaining her oxygenation well.  It is difficult to tell how much of her problem is from her stroke  and how much is from cocaine alcohol and nicotine withdrawal.  She is more alert. Principal Problem:   Acute CVA (cerebrovascular accident) [facility]) Active Problems:   Essential hypertension   PSVT (paroxysmal supraventricular tachycardia) (HCC)   Chest pain   Diabetes mellitus, type II (HCC)   Tobacco abuse   Cocaine abuse (HCC)   Generalized weakness   Hepatic cirrhosis (HCC)   GERD (gastroesophageal reflux disease)   Leukocytosis   Hypomagnesemia   Uncontrolled type 2 diabetes mellitus with hyperglycemia, with long-term current use of insulin (HCC)   CKD (chronic kidney disease), stage III (HCC)   Abnormal EKG   Cerebellar stroke ([place])    Plan: I will follow more peripherally.  She is tolerating nasal oxygen and I do not think there is much else for me to add at this point.  Thanks for allowing me to see her with you    LOS: 8 days   Johnnisha Forton L 12/31/2017, 7:52 AM

## 2017-12-31 NOTE — Progress Notes (Signed)
Attempted to give patient applesauce and see if she would be able to eat. Patient made no attempt at swallowing. Able to give patient applesauce but she would not follow any commands to swallow. Attempted two different times today but patient made no efforts either time. Dr. Roderic Palau made aware. Awaiting neuro to see patient, paged [place] this morning to inform him of need to reassess patient for encephalopathy. Will continue to closely monitor.

## 2017-12-31 NOTE — Care Management Note (Signed)
Case Management Note  Patient Details  Name: Cassandra Jordan MRN: [id number] Date of Birth: 06-Jun-1950   If discussed at Long Length of Stay Meetings, dates discussed:  12/31/2017  Additional Comments:  Alanea Woolridge, Chauncey Reading, RN 12/31/2017, 12:07 PM

## 2017-12-31 NOTE — Progress Notes (Signed)
PROGRESS NOTE  Cassandra Jordan  HBZ:[id number]  DOB: 01-15-1951  DOA: 12/23/2017 PCP: [facility]   Brief Admission Hx: 67 y.o. female chronic cocaine abuser, smoker, diabetic, with known cerebrovascular and heart disease presented to ED with complaints of a frontal headache and acute CVA.    MDM/Assessment & Plan:   1. Acute CVA - MRI reveals multiple acute infarcts in deep white matter and along the left PICA territory.  Pt is on CVA treatment protocol.  Neuro recommends dual antiplatelet therapy.  Avoid abrupt and rapid drop in blood pressure.  Managing blood pressure has been challenging since she is been unable to take anything by mouth 2. 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  We will plan on discussing with vascular surgery when she has stabilized.  Neurology has recommended 30 day event monitor and this is been arranged through cardiology.   3. Dyslipidemia - LDL of 181 - continue atorvastatin 80 mg daily when able to take medications by mouth. 4. Prolonged QTc - monitoring on telemetry.  Monitor lytes.   5. Aspiration pneumonia -she is completed a course of intravenous Unasyn.  Remains n.p.o. due to mental status. 6. Acute respiratory distress -briefly required CPAP/BiPAP.  Pulmonology following.  She is now off nasal cannula and has oxygen saturations in the 90s. 7. COPD.  No wheezing at this time.  Continue bronchodilators as needed 8. Acute alcohol withdrawal -patient is on CIWA protocol.  We will try and minimize Ativan use and she remains lethargic.  Morphine is also been discontinued.   9. Essential hypertension -blood pressures remain elevated.  Her p.o. medications are being held since her mental status is still lethargic.  Once mental status improves, can restart oral medications 10. PSVT - cardiology has restarted patient on her home amiodarone  and carvedilol.  Unfortunately, these medications have been held due to n.p.o. status.  Will restart them as her mental status tolerates. 11. Type 2 DM - CBG Q4h while NPO plus SSI coverage. A1c is 6.7% which is at ADA goal.  12. Cocaine abuse - Social worker consulted to discuss treatment options with patient.   13. Acute encephalopathy.  Unclear how much of this is related to her stroke.  She had alcohol withdrawal, and was treated with Ativan per CIWA protocol.  It appears that her withdrawal is better, but her encephalopathy persists.  All sedative medications have now been discontinued.  Will request neurology to reassess to see if any further work-up for encephalopathy is needed and to help determine further prognosis if the patient's mental status fails to improve and will remain as it is long-term, will need to discuss goals of care with her family. 14. Hypernatremia.  Related to lack of p.o. intake due to n.p.o. status.  She has not been awake enough to take anything by mouth.  Continue on hypotonic fluids.  DVT prophylaxis: lovenox Code Status: Full  Family Communication: No family present Disposition Plan: SNF recommended  Consultants:  Neurology  Cardiology  Pulmonology  Antimicrobials:  Unasyn 12/5>  Subjective: She does not answer any questions or follow commands.  Objective: Vitals:   12/31/17 1000 12/31/17 1138 12/31/17 1300 12/31/17 1600  BP: (!) 198/101  (!) 178/98 (!) 190/100  Pulse: 86     Resp: (!) 23     Temp:  98.4 F (36.9 C)    TempSrc:  Axillary  SpO2: 96%     Weight:      Height:        Intake/Output Summary (Last 24 hours) at 12/31/2017 2011 Last data filed at 12/31/2017 1753 Gross per 24 hour  Intake 1723.53 ml  Output 950 ml  Net 773.53 ml   Filed Weights   12/27/17 0500 12/29/17 0331 12/31/17 0500  Weight: 68.7 kg 66.3 kg 65.8 kg   Exam:  General exam: Alert, awake, no distress Respiratory system: Clear to auscultation.  Respiratory effort normal. Cardiovascular system:RRR. No murmurs, rubs, gallops. Gastrointestinal system: Abdomen is nondistended, soft and nontender. No organomegaly or masses felt. Normal bowel sounds heard. Central nervous system: No focal neurological deficits. Extremities: No C/C/E, +pedal pulses Skin: No rashes, lesions or ulcers Psychiatry: Remains somnolent.  Unable to answer any questions or follow commands.     Data Reviewed: Basic Metabolic Panel: Recent Labs  Lab 12/26/17 0423 12/27/17 0455 12/28/17 0436 12/29/17 0442 12/30/17 0643 12/31/17 0418  NA 139 142 144 145 148* 149*  K 4.8 4.9 3.8 3.9 3.5 3.6  CL 110 113* 113* 113* 114* 115*  CO2 22 15* 24 23 23 24   GLUCOSE 135* 96 111* 105* 97 114*  BUN 24* 32* 28* 26* 24* 24*  CREATININE 1.66* 1.69* 1.31* 1.28* 1.33* 1.45*  CALCIUM 8.4* 8.3* 8.8* 8.6* 8.9 8.5*  MG 2.3 2.5* 2.2 2.0 2.0  --    Liver Function Tests: Recent Labs  Lab 12/26/17 0423 12/28/17 0436 12/29/17 0442 12/30/17 0643 12/31/17 0418  AST 60* 58* 51* 45* 42*  ALT 19 23 26 25 24   ALKPHOS 41 39 40 41 39  BILITOT 0.6 0.7 0.8 1.0 0.8  PROT 7.0 7.1 7.3 7.7 7.4  ALBUMIN 2.6* 2.6* 2.7* 2.8* 2.7*   No results for input(s): LIPASE, AMYLASE in the last 168 hours. No results for input(s): AMMONIA in the last 168 hours. CBC: Recent Labs  Lab 12/27/17 0615 12/28/17 0436 12/29/17 0442 12/30/17 0643 12/31/17 0418  WBC 10.5 14.1* 10.7* 10.9* 10.2  NEUTROABS 7.3 8.9* 7.2 6.6 5.8  HGB 14.8 10.4* 10.1* 10.4* 10.0*  HCT 48.1* 34.2* 33.2* 34.1* 33.2*  MCV 91.6 92.2 90.5 90.7 91.0  PLT 211 346 350 371 356   Cardiac Enzymes: No results for input(s): CKTOTAL, CKMB, CKMBINDEX, TROPONINI in the last 168 hours. CBG (last 3)  Recent Labs    12/31/17 1137 12/31/17 1639 12/31/17 [place] 123* 144* 126*   Recent Results (from the past 240 hour(s))  MRSA PCR Screening     Status: None   Collection Time: 12/23/17 12:20 PM  Result Value Ref Range  Status   MRSA by PCR NEGATIVE NEGATIVE Final    Comment:        The GeneXpert MRSA Assay (FDA approved for NASAL specimens only), is one component of a comprehensive MRSA colonization surveillance program. It is not intended to diagnose MRSA infection nor to guide or monitor treatment for MRSA infections. Performed at [facility], [address], [place], [place] [address]      Studies: No results found. Scheduled Meds: . amiodarone  100 mg Oral Daily  . amLODipine  5 mg Oral Daily  . aspirin  325 mg Oral Daily   Or  . aspirin  300 mg Rectal Daily  . atorvastatin  80 mg Oral q1800  . carvedilol  6.25 mg Oral BID WC  . cloNIDine  0.1 mg Oral BID  . clopidogrel  75 mg Oral Q breakfast  .  clotrimazole  1 Applicatorful Vaginal QHS  . enoxaparin (LOVENOX) injection  40 mg Subcutaneous Q24H  . folic acid  1 mg Oral Daily  . Influenza vac split quadrivalent PF  0.5 mL Intramuscular Tomorrow-1000  . insulin aspart  0-9 Units Subcutaneous Q4H  . mouth rinse  15 mL Mouth Rinse BID  . multivitamin with minerals  1 tablet Oral Daily  . thiamine  100 mg Oral Daily   Or  . thiamine  100 mg Intravenous Daily   Continuous Infusions: . sodium chloride 10 mL/hr at 12/29/17 2015  . ampicillin-sulbactam (UNASYN) IV Stopped (12/31/17 1611)  . dextrose 5 % with KCl 20 mEq / L 125 mL/hr at 12/31/17 1753    Principal Problem:   Acute CVA (cerebrovascular accident) [facility]) Active Problems:   Essential hypertension   PSVT (paroxysmal supraventricular tachycardia) (HCC)   Chest pain   Diabetes mellitus, type II (HCC)   Tobacco abuse   Cocaine abuse (HCC)   Generalized weakness   Hepatic cirrhosis (HCC)   GERD (gastroesophageal reflux disease)   Leukocytosis   Hypomagnesemia   Uncontrolled type 2 diabetes mellitus with hyperglycemia, with long-term current use of insulin (HCC)   CKD (chronic kidney disease), stage III ([place])   Abnormal EKG   Cerebellar stroke ([place])  Time spent: 35  minutes  Kathie Dike, MD [facility] Pager [phone]  If 7PM-7AM, please contact night-coverage [email or web address] Password [email or web address] 12/31/2017, 8:11 PM    LOS: 8 days

## 2018-01-01 ENCOUNTER — Inpatient Hospital Stay (HOSPITAL_COMMUNITY): Payer: Medicare Other

## 2018-01-01 LAB — GLUCOSE, CAPILLARY
GLUCOSE-CAPILLARY: 128 mg/dL — AB (ref 70–99)
Glucose-Capillary: 105 mg/dL — ABNORMAL HIGH (ref 70–99)
Glucose-Capillary: 115 mg/dL — ABNORMAL HIGH (ref 70–99)
Glucose-Capillary: 120 mg/dL — ABNORMAL HIGH (ref 70–99)
Glucose-Capillary: 129 mg/dL — ABNORMAL HIGH (ref 70–99)
Glucose-Capillary: 149 mg/dL — ABNORMAL HIGH (ref 70–99)
Glucose-Capillary: 84 mg/dL (ref 70–99)

## 2018-01-01 LAB — BASIC METABOLIC PANEL
ANION GAP: 7 (ref 5–15)
BUN: 18 mg/dL (ref 8–23)
CO2: 25 mmol/L (ref 22–32)
Calcium: 8.3 mg/dL — ABNORMAL LOW (ref 8.9–10.3)
Chloride: 109 mmol/L (ref 98–111)
Creatinine, Ser: 1.36 mg/dL — ABNORMAL HIGH (ref 0.44–1.00)
GFR calc Af Amer: 47 mL/min — ABNORMAL LOW (ref >60)
GFR calc non Af Amer: 40 mL/min — ABNORMAL LOW (ref >60)
Glucose, Bld: 173 mg/dL — ABNORMAL HIGH (ref 70–99)
POTASSIUM: 3.3 mmol/L — AB (ref 3.5–5.1)
Sodium: 141 mmol/L (ref 135–145)

## 2018-01-01 LAB — VITAMIN B12: Vitamin B-12: 615 pg/mL (ref 180–914)

## 2018-01-01 NOTE — Progress Notes (Signed)
Pt did well throughout the shift. She had periods of yelling for "Lattie Haw" and "Izora Gala" but other than that, she remained calm and slept some. She is alert and knows who she is and her date of birth but unsure of the month or year. She can, but won't always, stick her tongue out, lick her lips and will not move tongue from side to side. Also has difficulty raising eyebrows.

## 2018-01-01 NOTE — Progress Notes (Addendum)
PROGRESS NOTE  Cassandra Jordan  OLM:[id number]  DOB: August 16, 1950  DOA: 12/23/2017 PCP: [facility]   Brief Admission Hx: 67 y.o. female chronic cocaine abuser, smoker, diabetic, with known cerebrovascular and heart disease presented to ED with complaints of a frontal headache and acute CVA.    MDM/Assessment & Plan:   1. Acute CVA - MRI reveals multiple acute infarcts in deep white matter and along the left PICA territory.  Pt is on CVA treatment protocol.  Neuro recommends dual antiplatelet therapy.  Avoid abrupt and rapid drop in blood pressure.  Managing blood pressure has been challenging since she is been unable to take anything by mouth 2. 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  We will plan on discussing with vascular surgery when she has stabilized.  Neurology has recommended 30 day event monitor and this is been arranged through cardiology.   3. Dyslipidemia - LDL of 181 - continue atorvastatin 80 mg daily when able to take medications by mouth. 4. Prolonged QTc - monitoring on telemetry.  Monitor lytes.   5. Aspiration pneumonia -she is completed a course of intravenous Unasyn.  Remains n.p.o. due to mental status. 6. Acute respiratory distress -briefly required CPAP/BiPAP.  Pulmonology following.  She is now off nasal cannula and has oxygen saturations in the 90s. 7. COPD.  No wheezing at this time.  Continue bronchodilators as needed 8. Acute alcohol withdrawal-resolved.  She persists with lethargy.  Ativan and morphine have been discontinued. 9. Essential hypertension -blood pressures remain elevated.  Her p.o. medications are being held since her mental status is still lethargic.  Once mental status improves, can restart oral medications 10. PSVT - cardiology has restarted patient on her home amiodarone and carvedilol.  Unfortunately, these medications  have been held due to n.p.o. status.  Will restart them as her mental status tolerates. 11. Type 2 DM - CBG Q4h while NPO plus SSI coverage. A1c is 6.7% which is at ADA goal.  12. Cocaine abuse - Social worker consulted to discuss treatment options with patient.   13. Acute encephalopathy.  Unclear how much of this is related to her stroke.  She had alcohol withdrawal, and was treated with Ativan per CIWA protocol.  It appears that her withdrawal is better, but her encephalopathy persists.  All sedative medications have now been discontinued.  Will request neurology to reassess to see if any further work-up for encephalopathy is needed and to help determine further prognosis. If the patient's mental status fails to improve and will remain as it is long-term, will need to discuss goals of care with her family.  No obvious reversible causes of encephalopathy at this time. 14. Hypernatremia.  Related to lack of p.o. intake due to n.p.o. status.  Mental status has been limiting factor in p.o. intake.  Sodium has improved with hypotonic fluids.  DVT prophylaxis: lovenox Code Status: Full  Family Communication: Discussed with daughter, [name] over the phone.  Explained that if patient's condition does not improve and her prognosis is poor, we will need to have further conversations around goals of care and quality of life as well as alternative nutrition sources.  I have advised against PEG tube placement and her daughter seems understanding of this, but would like to obtain further neurology input prior to having these discussions. Disposition Plan: SNF recommended  Consultants:  Neurology  Cardiology  Pulmonology  Antimicrobials:  Unasyn 12/5>12/10  Subjective: She remains somnolent.  Wakes up to voice, but does not answer questions or follow commands.  Attempts were made to feed the patient yesterday.  She held the applesauce in her mouth and did not swallow it.  Has not had any nutritional intake  in several days.  Objective: Vitals:   01/01/18 1100 01/01/18 1112 01/01/18 1200 01/01/18 1300  BP: (!) 186/104  (!) 180/109 (!) 184/102  Pulse: 82 84 92 67  Resp:      Temp:  98.5 F (36.9 C)    TempSrc:  Axillary    SpO2: 98% 95% 96% 92%  Weight:      Height:        Intake/Output Summary (Last 24 hours) at 01/01/2018 1554 Last data filed at 01/01/2018 0500 Gross per 24 hour  Intake 873.3 ml  Output 675 ml  Net 198.3 ml   Filed Weights   12/29/17 0331 12/31/17 0500 01/01/18 0500  Weight: 66.3 kg 65.8 kg 65.5 kg   Exam:  General exam: Somnolent, responds to voice, does not follow commands. Respiratory system: Clear to auscultation. Respiratory effort normal. Cardiovascular system:RRR. No murmurs, rubs, gallops. Gastrointestinal system: Abdomen is nondistended, soft and nontender. No organomegaly or masses felt. Normal bowel sounds heard. Central nervous system:.  Moving all extremities spontaneously.  Has left-sided facial droop Extremities: No C/C/E, +pedal pulses Skin: No rashes, lesions or ulcers Psychiatry: Speech is incoherent, babbles but does not open eyes and engage in conversation  Data Reviewed: Basic Metabolic Panel: Recent Labs  Lab 12/26/17 0423 12/27/17 0455 12/28/17 0436 12/29/17 0442 12/30/17 0643 12/31/17 0418 01/01/18 0403  NA 139 142 144 145 148* 149* 141  K 4.8 4.9 3.8 3.9 3.5 3.6 3.3*  CL 110 113* 113* 113* 114* 115* 109  CO2 22 15* 24 23 23 24 25   GLUCOSE 135* 96 111* 105* 97 114* 173*  BUN 24* 32* 28* 26* 24* 24* 18  CREATININE 1.66* 1.69* 1.31* 1.28* 1.33* 1.45* 1.36*  CALCIUM 8.4* 8.3* 8.8* 8.6* 8.9 8.5* 8.3*  MG 2.3 2.5* 2.2 2.0 2.0  --   --    Liver Function Tests: Recent Labs  Lab 12/26/17 0423 12/28/17 0436 12/29/17 0442 12/30/17 0643 12/31/17 0418  AST 60* 58* 51* 45* 42*  ALT 19 23 26 25 24   ALKPHOS 41 39 40 41 39  BILITOT 0.6 0.7 0.8 1.0 0.8  PROT 7.0 7.1 7.3 7.7 7.4  ALBUMIN 2.6* 2.6* 2.7* 2.8* 2.7*   No  results for input(s): LIPASE, AMYLASE in the last 168 hours. No results for input(s): AMMONIA in the last 168 hours. CBC: Recent Labs  Lab 12/27/17 0615 12/28/17 0436 12/29/17 0442 12/30/17 0643 12/31/17 0418  WBC 10.5 14.1* 10.7* 10.9* 10.2  NEUTROABS 7.3 8.9* 7.2 6.6 5.8  HGB 14.8 10.4* 10.1* 10.4* 10.0*  HCT 48.1* 34.2* 33.2* 34.1* 33.2*  MCV 91.6 92.2 90.5 90.7 91.0  PLT 211 346 350 371 356   Cardiac Enzymes: No results for input(s): CKTOTAL, CKMB, CKMBINDEX, TROPONINI in the last 168 hours. CBG (last 3)  Recent Labs    01/01/18 0428 01/01/18 0735 01/01/18 1114  GLUCAP 149* 84 128*   Recent Results (from the past 240 hour(s))  MRSA PCR Screening     Status: None   Collection Time: 12/23/17 12:20 PM  Result Value Ref Range Status   MRSA by PCR NEGATIVE NEGATIVE Final    Comment:        The GeneXpert MRSA Assay (FDA  approved for NASAL specimens only), is one component of a comprehensive MRSA colonization surveillance program. It is not intended to diagnose MRSA infection nor to guide or monitor treatment for MRSA infections. Performed at [facility], [address], [place], [place] [address]      Studies: No results found. Scheduled Meds: . amiodarone  100 mg Oral Daily  . amLODipine  5 mg Oral Daily  . aspirin  325 mg Oral Daily   Or  . aspirin  300 mg Rectal Daily  . atorvastatin  80 mg Oral q1800  . carvedilol  6.25 mg Oral BID WC  . cloNIDine  0.1 mg Oral BID  . clopidogrel  75 mg Oral Q breakfast  . enoxaparin (LOVENOX) injection  40 mg Subcutaneous Q24H  . folic acid  1 mg Oral Daily  . Influenza vac split quadrivalent PF  0.5 mL Intramuscular Tomorrow-1000  . insulin aspart  0-9 Units Subcutaneous Q4H  . mouth rinse  15 mL Mouth Rinse BID  . multivitamin with minerals  1 tablet Oral Daily  . thiamine  100 mg Oral Daily   Or  . thiamine  100 mg Intravenous Daily   Continuous Infusions: . sodium chloride 10 mL/hr at 12/29/17 2015  .  dextrose 5 % with KCl 20 mEq / L 20 mEq (01/01/18 1325)    Principal Problem:   Acute CVA (cerebrovascular accident) ([employer]) Active Problems:   Essential hypertension   PSVT (paroxysmal supraventricular tachycardia) (HCC)   Chest pain   Diabetes mellitus, type II (HCC)   Tobacco abuse   Cocaine abuse (HCC)   Generalized weakness   Hepatic cirrhosis (HCC)   GERD (gastroesophageal reflux disease)   Leukocytosis   Hypomagnesemia   Uncontrolled type 2 diabetes mellitus with hyperglycemia, with long-term current use of insulin (HCC)   CKD (chronic kidney disease), stage III (HCC)   Abnormal EKG   Cerebellar stroke ([place])  Time spent: 35 minutes  Kathie Dike, MD [facility] Pager [phone]  If 7PM-7AM, please contact night-coverage [email or web address] Password [email or web address] 01/01/2018, 3:54 PM    LOS: 9 days

## 2018-01-01 NOTE — Progress Notes (Signed)
Pt loudly saying she can't sleep and she needs to sleep. She then states "I can't sleep because I don't have my CPAP. I need my CPAP". Respiratory was advised that she was asking for it and applied CPAP. Pt is resting quietly at the present time.

## 2018-01-01 NOTE — Progress Notes (Signed)
[place] A. Merlene Laughter, MD     [email or web address]          Cassandra Jordan is an 67 y.o. female.   Assessment/Plan: 1.  Deterioration in neurological function: I suspect this is extension of her initial infarct.  She unfortunately may be left with significant deficit that may not improve much from this baseline.  A repeat CT scan will be obtained.  Additionally, EEG and additional labs will also be obtained.     I was asked to return to see the patient for deterioration in neurological function.  She has reportedly not been speaking much and mostly unresponsive.  Previous notes and work-up are reviewed and outlined below.     GENERAL: She cooperates with evaluation and in no acute distress.  HEENT: Neck is supple without trauma.  ABDOMEN: soft  EXTREMITIES: No edema   BACK: Normal  SKIN: Normal by inspection.    MENTAL STATUS: She is awake but disoriented.  She thinks that she is in her sister's bed.  She has a rather profound dysarthria.  This is clearly different than her previous evaluation.  She does follow commands briskly.  She does seem to have a left-sided neglect.  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  MOTOR: She moves the right side spontaneously and well with strength graded as about 4/5.  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  There is marked drift on the left side.  COORDINATION: No tremors, rigidity, parkinsonism or dysmetria are appreciated.  SENSATION: There is a left-sided neglect.         PRIOR NOTE 1.  Bihemispheric acute ischemic strokes: This raises the likelihood of cardioembolic phenomena either from arrhythmia such as atrial fibrillation or hypovolemia  / hypoperfusion .  A 30-day event monitor is suggested.  2.  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  Vascular surgery consultation is recommended for the external carotid stenosis.  This is urgent but not emergent.  Dual antiplatelet agents of aspirin and Plavix is recommended.  Statin medications also recommended.  Avoid abrupt and rapid drop in blood pressure.  Treatment for the intracranial stenosis is maximal medical therapies.  Cessation of illicit drug use and nicotine particularly cocaine is discussed with the patient.  Because of the lack of definitive time onset, the patient is not a candidate for thrombolysis or thrombectomy.     The patient is a 67 year old black female who presents with the acute onset of frontal headaches.  She also had some chest pain.  The patient denies any dizziness, dysarthria, dysphasia focal numbness or weakness.  She denies any visual symptoms.  There may have been some gait instability but fleeting.  She apparently has a history of right carotid stenosis and was to see a vascular surgeon but did not go to this appointment.  She is on aspirin and reports being compliant with this.  She has a longstanding history of chronic cocaine use and admits to using cocaine on the day of the acute headache.  She recently has been diagnosed with obstructive sleep apnea syndrome.  She intends on using her CPAP machine.  She patient was drowsy which limits the review of systems although the review of system is otherwise unrevealing.  GENERAL: This is a pleasant female she is in no acute distress.  HEENT: This is supple.  No trauma appreciated.  ABDOMEN: soft  EXTREMITIES: No edema   BACK: Normal  SKIN: Normal by inspection.    MENTAL STATUS: Alert and generally oriented although she states the month as January.  She states her age appropriately. Speech, language and cognition are generally intact. Judgment and  insight normal. She follows commands briskly.   CRANIAL NERVES: Pupils are equal, round and reactive to light and accomodation; extra ocular movements are full, there is no significant nystagmus; visual fields a dense left homonymous hemianopia; upper and lower facial muscles are normal in strength and symmetric, there is no flattening of the nasolabial folds; tongue is midline; uvula is midline; shoulder elevation is normal.  MOTOR: She does not have a downward drift of the upper extremities but does have a mild pronator drift of the left upper extremity.  She has good strength in the upper extremities both proximally and distally.  She has a mild drift of the left leg and a more profound drift on the right.  Strength in the lower extremities are 4/5 on the right hip flexion and 4+/5 on the left.    COORDINATION: No tremors or dysmetria are appreciated.  No parkinsonism.  REFLEXES: Deep tendon reflexes are symmetrical and normal.   SENSATION: Normal to light touch, temperature, and pain.  No clear evidence of neglect on the left but she appears to have some right left disorientation.    Objective: Vital signs in last 24 hours: Temp:  [97.6 F (36.4 C)-98.7 F (37.1 C)] 98.7 F (37.1 C) (12/11 1607) Pulse Rate:  [67-92] 80 (12/11 1700) Resp:  [15-27] 27 (12/11 1700) BP: (147-204)/(83-181) 193/101 (12/11 1700) SpO2:  [90 %-100 %] 90 % (12/11 1700) Weight:  [65.5 kg] 65.5 kg (12/11 0500)  Intake/Output from previous day: 12/10 0701 - 12/11 0700 In: 873.3 [I.V.:673.3; IV Piggyback:200] Out: 675 [Urine:675] Intake/Output this shift: No intake/output data recorded. Nutritional status:  Diet Order            Diet Heart Room service appropriate? Yes; Fluid consistency: Thin  Diet effective now               Lab Results: Results for orders placed or performed during the hospital encounter of 12/23/17 (from the past 48 hour(s))  Glucose, capillary     Status: None    Collection Time: 12/30/17  7:54 PM  Result Value Ref Range   Glucose-Capillary 83 70 - 99 mg/dL  Glucose, capillary     Status: Abnormal   Collection Time: 12/30/17 11:33 PM  Result Value Ref Range   Glucose-Capillary 101 (H) 70 - 99 mg/dL  Glucose, capillary     Status: Abnormal   Collection Time: 12/31/17  4:04 AM  Result Value Ref Range   Glucose-Capillary 116 (H) 70 - 99 mg/dL  Comprehensive metabolic panel     Status: Abnormal   Collection Time: 12/31/17  4:18 AM  Result Value Ref Range   Sodium 149 (H) 135 - 145 mmol/L   Potassium 3.6 3.5 - 5.1 mmol/L   Chloride 115 (H) 98 - 111 mmol/L   CO2 24 22 - 32 mmol/L   Glucose, Bld 114 (H) 70 - 99 mg/dL   BUN 24 (H) 8 - 23 mg/dL   Creatinine, Ser 1.45 (H) 0.44 - 1.00 mg/dL   Calcium 8.5 (L) 8.9 - 10.3 mg/dL   Total Protein 7.4 6.5 -  8.1 g/dL   Albumin 2.7 (L) 3.5 - 5.0 g/dL   AST 42 (H) 15 - 41 U/L   ALT 24 0 - 44 U/L   Alkaline Phosphatase 39 38 - 126 U/L   Total Bilirubin 0.8 0.3 - 1.2 mg/dL   GFR calc non Af Amer 37 (L) >60 mL/min   GFR calc Af Amer 43 (L) >60 mL/min   Anion gap 10 5 - 15    Comment: Performed at [facility], [address], [place], [place] [address]  CBC with Differential/Platelet     Status: Abnormal   Collection Time: 12/31/17  4:18 AM  Result Value Ref Range   WBC 10.2 4.0 - 10.5 K/uL   RBC 3.65 (L) 3.87 - 5.11 MIL/uL   Hemoglobin 10.0 (L) 12.0 - 15.0 g/dL   HCT 33.2 (L) 36.0 - 46.0 %   MCV 91.0 80.0 - 100.0 fL   MCH 27.4 26.0 - 34.0 pg   MCHC 30.1 30.0 - 36.0 g/dL   RDW 14.0 11.5 - 15.5 %   Platelets 356 150 - 400 K/uL   nRBC 0.2 0.0 - 0.2 %   Neutrophils Relative % 56 %   Neutro Abs 5.8 1.7 - 7.7 K/uL   Lymphocytes Relative 30 %   Lymphs Abs 3.0 0.7 - 4.0 K/uL   Monocytes Relative 10 %   Monocytes Absolute 1.0 0.1 - 1.0 K/uL   Eosinophils Relative 1 %   Eosinophils Absolute 0.1 0.0 - 0.5 K/uL   Basophils Relative 1 %   Basophils Absolute 0.1 0.0 - 0.1 K/uL   Immature Granulocytes 2 %    Abs Immature Granulocytes 0.15 (H) 0.00 - 0.07 K/uL    Comment: Performed at [facility], [address], [place], [place] [address]  Glucose, capillary     Status: Abnormal   Collection Time: 12/31/17  8:07 AM  Result Value Ref Range   Glucose-Capillary 128 (H) 70 - 99 mg/dL  Glucose, capillary     Status: Abnormal   Collection Time: 12/31/17 11:37 AM  Result Value Ref Range   Glucose-Capillary 123 (H) 70 - 99 mg/dL  Glucose, capillary     Status: Abnormal   Collection Time: 12/31/17  4:39 PM  Result Value Ref Range   Glucose-Capillary 144 (H) 70 - 99 mg/dL  Glucose, capillary     Status: Abnormal   Collection Time: 12/31/17  7:56 PM  Result Value Ref Range   Glucose-Capillary 126 (H) 70 - 99 mg/dL   Comment 1 Notify RN    Comment 2 Document in Chart   Glucose, capillary     Status: Abnormal   Collection Time: 01/01/18 12:57 AM  Result Value Ref Range   Glucose-Capillary 115 (H) 70 - 99 mg/dL  Basic metabolic panel     Status: Abnormal   Collection Time: 01/01/18  4:03 AM  Result Value Ref Range   Sodium 141 135 - 145 mmol/L    Comment: DELTA CHECK NOTED   Potassium 3.3 (L) 3.5 - 5.1 mmol/L   Chloride 109 98 - 111 mmol/L   CO2 25 22 - 32 mmol/L   Glucose, Bld 173 (H) 70 - 99 mg/dL   BUN 18 8 - 23 mg/dL   Creatinine, Ser 1.36 (H) 0.44 - 1.00 mg/dL   Calcium 8.3 (L) 8.9 - 10.3 mg/dL   GFR calc non Af Amer 40 (L) >60 mL/min   GFR calc Af Amer 47 (L) >60 mL/min   Anion gap 7 5 -  15    Comment: Performed at [facility], [address], [place], [place] [address]  Glucose, capillary     Status: Abnormal   Collection Time: 01/01/18  4:28 AM  Result Value Ref Range   Glucose-Capillary 149 (H) 70 - 99 mg/dL  Glucose, capillary     Status: None   Collection Time: 01/01/18  7:35 AM  Result Value Ref Range   Glucose-Capillary 84 70 - 99 mg/dL  Glucose, capillary     Status: Abnormal   Collection Time: 01/01/18 11:14 AM  Result Value Ref Range   Glucose-Capillary 128 (H) 70 -  99 mg/dL  Glucose, capillary     Status: Abnormal   Collection Time: 01/01/18  4:09 PM  Result Value Ref Range   Glucose-Capillary 129 (H) 70 - 99 mg/dL    Lipid Panel No results for input(s): CHOL, TRIG, HDL, CHOLHDL, VLDL, LDLCALC in the last 72 hours.  Studies/Results:   Medications:  Scheduled Meds: . amiodarone  100 mg Oral Daily  . amLODipine  5 mg Oral Daily  . aspirin  325 mg Oral Daily   Or  . aspirin  300 mg Rectal Daily  . atorvastatin  80 mg Oral q1800  . carvedilol  6.25 mg Oral BID WC  . cloNIDine  0.1 mg Oral BID  . clopidogrel  75 mg Oral Q breakfast  . enoxaparin (LOVENOX) injection  40 mg Subcutaneous Q24H  . folic acid  1 mg Oral Daily  . Influenza vac split quadrivalent PF  0.5 mL Intramuscular Tomorrow-1000  . insulin aspart  0-9 Units Subcutaneous Q4H  . mouth rinse  15 mL Mouth Rinse BID  . multivitamin with minerals  1 tablet Oral Daily  . thiamine  100 mg Oral Daily   Or  . thiamine  100 mg Intravenous Daily   Continuous Infusions: . sodium chloride 10 mL/hr at 12/29/17 2015  . dextrose 5 % with KCl 20 mEq / L 20 mEq (01/01/18 1325)   PRN Meds:.sodium chloride, acetaminophen **OR** acetaminophen (TYLENOL) oral liquid 160 mg/5 mL **OR** acetaminophen, albuterol, hydrALAZINE, nitroGLYCERIN, senna-docusate      BRAIN MRI MRA IMPRESSION: Acute infarction within the left posterior inferior cerebellar artery territory. Mild swelling but no hemorrhage or mass effect. Flow persists within the left vertebral artery and at least to some extent within the left posterior inferior cerebellar artery.  Multiple acute infarctions within the deep white matter/radiating white matter tracts of the right cerebral hemisphere, most consistent with watershed or hypoperfusion infarctions. Mild swelling but no hemorrhage or mass effect. 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  The intracranial MR  angiogram is motion degraded compared to previous studies. Both anterior cerebral arteries receive there supply from the left carotid circulation. No change seen on the left in the anterior circulation. The right supraclinoid ICA/proximal MCA stenosis is new.        The brain MRI and MRA are both reviewed in person.   There is bright signal involving the inferior year posterior aspect of the cerebellum on the left side.  It is rather extensive and likely represents distribution of the PICA.  There is also similar findings involving deep white matter on the right side in a paramedian distribution involving the centrum semiovale both anterior and posteriorly and also involving the atrium on the same side.  This is suggestive of watershed infarct.    There is marked confluent and the plaque-like deep white matter  findings involving both hemispheres.  There is also reduced signal /evidence of black holes involving the deep white matter/centrum semiovale anteriorly and posteriorly on the right side suggestive of remote ischemia. MRA shows evidence of cut off of the right ICA were the MCA branches.  There is also absent of A1 segment on the same side.  The imaging are degraded by motion artifact   LOS: 9 days   [place] S[place] A. Merlene Laughter, M.D.  Diplomate, Tax adviser of Psychiatry and Neurology ( Neurology).

## 2018-01-02 ENCOUNTER — Inpatient Hospital Stay (HOSPITAL_COMMUNITY)
Admit: 2018-01-02 | Discharge: 2018-01-02 | Disposition: A | Discharge status: Home / Self Care | Payer: Medicare Other | Attending: Neurology | Admitting: Neurology

## 2018-01-02 DIAGNOSIS — Z7189 Other specified counseling: Secondary | ICD-10-CM

## 2018-01-02 DIAGNOSIS — E1169 Type 2 diabetes mellitus with other specified complication: Secondary | ICD-10-CM

## 2018-01-02 DIAGNOSIS — K7460 Unspecified cirrhosis of liver: Secondary | ICD-10-CM

## 2018-01-02 DIAGNOSIS — Z515 Encounter for palliative care: Secondary | ICD-10-CM

## 2018-01-02 LAB — GLUCOSE, CAPILLARY
GLUCOSE-CAPILLARY: 121 mg/dL — AB (ref 70–99)
Glucose-Capillary: 117 mg/dL — ABNORMAL HIGH (ref 70–99)
Glucose-Capillary: 107 mg/dL — ABNORMAL HIGH (ref 70–99)
Glucose-Capillary: 122 mg/dL — ABNORMAL HIGH (ref 70–99)
Glucose-Capillary: 113 mg/dL — ABNORMAL HIGH (ref 70–99)

## 2018-01-02 MED ORDER — POTASSIUM CL IN DEXTROSE 5% 20 MEQ/L IV SOLN
20.0000 meq | INTRAVENOUS | Status: DC
  Filled 2018-01-02: qty 1000

## 2018-01-02 MED ORDER — ENSURE ENLIVE PO LIQD
237.0000 mL | Freq: Two times a day (BID) | ORAL | Status: DC
  Administered 2018-01-03 – 2018-01-08 (×9): 237 mL via ORAL

## 2018-01-02 MED ORDER — POTASSIUM CL IN DEXTROSE 5% 20 MEQ/L IV SOLN
20.0000 meq | INTRAVENOUS | Status: DC
  Administered 2018-01-02: 20 meq via INTRAVENOUS
  Filled 2018-01-02 (×4): qty 1000

## 2018-01-02 MED ORDER — CLONIDINE HCL 0.1 MG/24HR TD PTWK
0.1000 mg | MEDICATED_PATCH | TRANSDERMAL | Status: DC
  Administered 2018-01-02 – 2018-01-09 (×2): 0.1 mg via TRANSDERMAL
  Filled 2018-01-02 (×2): qty 1

## 2018-01-02 NOTE — Procedures (Signed)
[place] A. Merlene Laughter, MD     [email or web address]           HISTORY: The patient is a 67 year old female who presents with altered mental status and encephalopathy.  The study is being done to evaluate for seizures as the etiology.  MEDICATIONS: Scheduled Meds: . amiodarone  100 mg Oral Daily  . amLODipine  5 mg Oral Daily  . aspirin  325 mg Oral Daily   Or  . aspirin  300 mg Rectal Daily  . atorvastatin  80 mg Oral q1800  . carvedilol  6.25 mg Oral BID WC  . cloNIDine  0.1 mg Transdermal Weekly  . clopidogrel  75 mg Oral Q breakfast  . enoxaparin (LOVENOX) injection  40 mg Subcutaneous Q24H  . feeding supplement (ENSURE ENLIVE)  237 mL Oral BID BM  . folic acid  1 mg Oral Daily  . Influenza vac split quadrivalent PF  0.5 mL Intramuscular Tomorrow-1000  . insulin aspart  0-9 Units Subcutaneous Q4H  . mouth rinse  15 mL Mouth Rinse BID  . multivitamin with minerals  1 tablet Oral Daily  . thiamine  100 mg Oral Daily   Or  . thiamine  100 mg Intravenous Daily   Continuous Infusions: . sodium chloride 10 mL/hr at 12/29/17 2015  . dextrose 5 % with KCl 20 mEq / L 20 mEq (01/02/18 1707)   PRN Meds:.sodium chloride, acetaminophen **OR** acetaminophen (TYLENOL) oral liquid 160 mg/5 mL **OR** acetaminophen, albuterol, hydrALAZINE, nitroGLYCERIN, senna-docusate  Prior to Admission medications   Medication Sig Start Date End Date Taking? Authorizing Provider  amiodarone (PACERONE) 100 MG tablet Take 1 tablet (100 mg total) by mouth daily. TAKE ONE TABLET BY MOUTH ONCE DAILY 05/31/17  Yes Satira Sark, MD  amLODipine (NORVASC) 10 MG tablet Take 1 tablet (10 mg total) by mouth daily. 11/04/16  Yes TatShanon Brow, MD  aspirin EC 81 MG EC tablet Take 1 tablet (81 mg total) by mouth daily. 05/24/17  Yes Isaac Bliss, Rayford Halsted, MD  atorvastatin (LIPITOR) 40 MG tablet Take 1 tablet (40 mg total) by mouth daily at 6 PM. 05/23/17  Yes Isaac Bliss, Rayford Halsted, MD    carvedilol (COREG) 6.25 MG tablet Take 6.25 mg by mouth 2 (two) times daily. 08/21/17  Yes [provider]  cloNIDine (CATAPRES) 0.2 MG tablet Take 0.2 mg by mouth 2 (two) times daily. 08/21/17  Yes [provider]  furosemide (LASIX) 20 MG tablet Take 20 mg by mouth daily.   Yes [provider]  gabapentin (NEURONTIN) 600 MG tablet Take 1 tablet by mouth 3 (three) times daily. 04/17/17  Yes [provider]  LANTUS 100 UNIT/ML injection Inject 9 Units into the skin daily.  08/21/17  Yes [provider]  lisinopril-hydrochlorothiazide (PRINZIDE,ZESTORETIC) 20-25 MG tablet Take 1 tablet by mouth 2 (two) times daily.   Yes [provider]  pantoprazole (PROTONIX) 40 MG tablet Take 1 tablet (40 mg total) by mouth daily. Take 30 minutes before breakfast 03/06/16  Yes Annitta Needs, NP  potassium chloride (K-DUR) 10 MEQ tablet Take 1 tablet (10 mEq total) by mouth daily. 09/05/17   Noemi Chapel, MD      ANALYSIS: A 16 channel recording using standard 10 20 measurements is conducted for 22 minutes.  There is a background activity of 10 Hz which attenuates with eye opening.  There is beta activity observed in frontal areas.  Awake and drowsy activities are observed.  Photic stimulation  and hyperventilation are not conducted.  There is no focal or lateralized slowing.  There is no epileptiform activity is observed.   IMPRESSION: 1.  This is a normal recording of the awake and drowsy states.      Naje Rice A. Merlene Laughter, M.D.  Diplomate, Tax adviser of Psychiatry and Neurology ( Neurology).

## 2018-01-02 NOTE — Plan of Care (Signed)
  Problem: SLP Dysphagia Goals Goal: Patient will utilize recommended strategies Description Patient will utilize recommended strategies during swallow to increase swallowing safety with Flowsheets (Taken 01/02/2018 1713) Patient will utilize recommended strategies during swallow to increase swallowing safety with : min assist

## 2018-01-02 NOTE — Consult Note (Signed)
Consultation Note Date: 01/02/2018   Patient Name: Cassandra Jordan  DOB: 30-Oct-1950  MRN: [id number]  Age / Sex: 67 y.o., female  PCP: [facility] Referring Physician: Murlean Iba, MD  Reason for Consultation: Establishing goals of care  HPI/Patient Profile: 67 y.o. female  with past medical history of DM type 2, OSA, renal carcinoma, PSVT, HLD, hepatitis C, GERD, HTN, cocaine and ETOH abuse, athritis admitted on 12/23/2017 with headache. 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 Ongoing encephalopathy. Off BiPAP. Completed antibiotics for aspiration pneumonia. Palliative medicine consultation for goals of care.    Clinical Assessment and Goals of Care: Consult received and chart reviewed. Patient assessment completed. Patient is awake and alert to person and the fact that she is in a hospital. Reoriented to situation and time/date. Ms. Null will answer all simple questions and follow commands. She tells me she wants to go home. She denies pain or discomfort. No family at bedside.   Spoke with daughter, [name], via telephone x2 today. [name] works and unable to visit the hospital during daytime hours until the weekend. She plans to send her cousin, Gust Rung, to hospital tomorrow afternoon to speak with care team.   Introduced Palliative Medicine as specialized medical care for people living with serious illness. It focuses on providing relief from the symptoms and stress of a serious illness. The goal is to improve quality of life for both the patient and the family.  We discussed a brief life review of the patient. Living alone and independent prior to admission.   [name] shares that she is an only daughter and overwhelmed by her  mother's clinical condition. "I'm lost." She shares that she was communicating in ED and has gotten worse throughout hospitalization.  Discussed course of hospitalization including diagnoses and interventions. Discussed ongoing encephalopathy likely contributing to acute stroke. Discussed concern with nutritional status. Discussed her conversation with Dr. Ulice Bold yesterday regarding possible need for feeding tube and concerns with placing feeding tubes with irreversible damage/ongoing confusion.   Pending EEG and SLP evaluation today. [name] is not ready to make decisions today but hopeful to further discuss plan of care this weekend when off of work.    SUMMARY OF RECOMMENDATIONS    Initial palliative introduction and discussion with daughter via telephone. She understands she is faced with big decisions regarding plan of care. Daughter works weekly, daytime but hopeful to further discuss goals with attending this weekend.   Pending EEG and repeat SLP evaluation.   PMT will follow inpatient but unfortunately provider will not be at [facility] again until Monday, 12/16.  Code Status/Advance Care Planning:  Full code  Symptom Management:   Per attending  Palliative Prophylaxis:   Aspiration, Delirium Protocol, Oral Care and Turn Reposition  Additional Recommendations (Limitations, Scope, Preferences):  Full Scope Treatment  Psycho-social/Spiritual:   Desire for further Chaplaincy support:yes  Additional Recommendations: Caregiving  Support/Resources and  Education on Hospice  Prognosis:   Unable to determine  Discharge Planning: To Be Determined      Primary Diagnoses: Present on Admission: . Acute CVA (cerebrovascular accident) ([place]) . Chest pain . Leukocytosis . Hypomagnesemia . Hepatic cirrhosis ([place]) . Cocaine abuse ([place]) . CKD (chronic kidney disease), stage III ([place]) . PSVT (paroxysmal supraventricular tachycardia) ([place]) . GERD (gastroesophageal reflux disease) .  Essential hypertension . Tobacco abuse . Abnormal EKG   I have reviewed the medical record, interviewed the patient and family, and examined the patient. The following aspects are pertinent.  Past Medical History:  Diagnosis Date  . Alcohol abuse   . Arthritis   . Asthma   . Cocaine abuse ([place])   . Essential hypertension   . GERD (gastroesophageal reflux disease)   . Hepatitis C   . Hyperlipidemia   . Lichen sclerosus et atrophicus 10/13/2014  . Neuropathic pain   . Noncompliance   . PSVT (paroxysmal supraventricular tachycardia) ([place])   . Rash   . Renal carcinoma ([place])   . Sleep apnea   . Type 2 diabetes mellitus ([place])   . Vaginal irritation 10/01/2014  . Vaginal itching 10/01/2014  . Yeast infection of the vagina 10/01/2014   Social History   Socioeconomic History  . Marital status: Single    Spouse name: Not on file  . Number of children: Not on file  . Years of education: Not on file  . Highest education level: Not on file  Occupational History  . Not on file  Social Needs  . Financial resource strain: Not on file  . Food insecurity:    Worry: Not on file    Inability: Not on file  . Transportation needs:    Medical: Not on file    Non-medical: Not on file  Tobacco Use  . Smoking status: Current Some Day Smoker    Packs/day: 0.10    Years: 40.00    Pack years: 4.00    Types: Cigarettes  . Smokeless tobacco: Never Used  Substance and Sexual Activity  . Alcohol use: Not Currently    Alcohol/week: 0.0 standard drinks    Comment: rarely  . Drug use: Yes    Types: Marijuana, Cocaine    Comment: last used yesterday  . Sexual activity: Not Currently    Birth control/protection: Surgical    Comment: hyst  Lifestyle  . Physical activity:    Days per week: Not on file    Minutes per session: Not on file  . Stress: Not on file  Relationships  . Social connections:    Talks on phone: Not on file    Gets together: Not on file    Attends religious service: Not  on file    Active member of club or organization: Not on file    Attends meetings of clubs or organizations: Not on file    Relationship status: Not on file  Other Topics Concern  . Not on file  Social History Narrative  . Not on file   Family History  Problem Relation Age of Onset  . Colon cancer Sister 50  . Cancer Mother   . Heart attack Father   . Hypertension Daughter   . Cancer Maternal Grandfather   . Diabetes Paternal Grandmother    Scheduled Meds: . amiodarone  100 mg Oral Daily  . amLODipine  5 mg Oral Daily  . aspirin  325 mg Oral Daily   Or  . aspirin  300 mg Rectal  Daily  . atorvastatin  80 mg Oral q1800  . carvedilol  6.25 mg Oral BID WC  . cloNIDine  0.1 mg Transdermal Weekly  . clopidogrel  75 mg Oral Q breakfast  . enoxaparin (LOVENOX) injection  40 mg Subcutaneous Q24H  . feeding supplement (ENSURE ENLIVE)  237 mL Oral BID BM  . folic acid  1 mg Oral Daily  . Influenza vac split quadrivalent PF  0.5 mL Intramuscular Tomorrow-1000  . insulin aspart  0-9 Units Subcutaneous Q4H  . mouth rinse  15 mL Mouth Rinse BID  . multivitamin with minerals  1 tablet Oral Daily  . thiamine  100 mg Oral Daily   Or  . thiamine  100 mg Intravenous Daily   Continuous Infusions: . sodium chloride 10 mL/hr at 12/29/17 2015  . dextrose 5 % with KCl 20 mEq / L 100 mL/hr at 01/02/18 1208   PRN Meds:.sodium chloride, acetaminophen **OR** acetaminophen (TYLENOL) oral liquid 160 mg/5 mL **OR** acetaminophen, albuterol, hydrALAZINE, nitroGLYCERIN, senna-docusate Medications Prior to Admission:  Prior to Admission medications   Medication Sig Start Date End Date Taking? Authorizing Provider  amiodarone (PACERONE) 100 MG tablet Take 1 tablet (100 mg total) by mouth daily. TAKE ONE TABLET BY MOUTH ONCE DAILY 05/31/17  Yes Satira Sark, MD  amLODipine (NORVASC) 10 MG tablet Take 1 tablet (10 mg total) by mouth daily. 11/04/16  Yes TatShanon Brow, MD  aspirin EC 81 MG EC tablet Take  1 tablet (81 mg total) by mouth daily. 05/24/17  Yes Isaac Bliss, Rayford Halsted, MD  atorvastatin (LIPITOR) 40 MG tablet Take 1 tablet (40 mg total) by mouth daily at 6 PM. 05/23/17  Yes Isaac Bliss, Rayford Halsted, MD  carvedilol (COREG) 6.25 MG tablet Take 6.25 mg by mouth 2 (two) times daily. 08/21/17  Yes [provider]  cloNIDine (CATAPRES) 0.2 MG tablet Take 0.2 mg by mouth 2 (two) times daily. 08/21/17  Yes [provider]  furosemide (LASIX) 20 MG tablet Take 20 mg by mouth daily.   Yes [provider]  gabapentin (NEURONTIN) 600 MG tablet Take 1 tablet by mouth 3 (three) times daily. 04/17/17  Yes [provider]  LANTUS 100 UNIT/ML injection Inject 9 Units into the skin daily.  08/21/17  Yes [provider]  lisinopril-hydrochlorothiazide (PRINZIDE,ZESTORETIC) 20-25 MG tablet Take 1 tablet by mouth 2 (two) times daily.   Yes [provider]  pantoprazole (PROTONIX) 40 MG tablet Take 1 tablet (40 mg total) by mouth daily. Take 30 minutes before breakfast 03/06/16  Yes Annitta Needs, NP  potassium chloride (K-DUR) 10 MEQ tablet Take 1 tablet (10 mEq total) by mouth daily. 09/05/17   Noemi Chapel, MD   Allergies  Allergen Reactions  . Sulfa Antibiotics Itching and Other (See Comments)    burning   Review of Systems  Unable to perform ROS: Acuity of condition    Physical Exam Vitals signs and nursing note reviewed.  Constitutional:      Appearance: She is ill-appearing.  Cardiovascular:     Rate and Rhythm: Normal rate.  Pulmonary:     Effort: No tachypnea, accessory muscle usage or respiratory distress.  Skin:    General: Skin is warm and dry.  Neurological:     Mental Status: She is easily aroused.     Comments: Awake, alert to person and hospital. Reoriented to time and situation. Following commands.   Psychiatric:        Cognition and Memory: Cognition  is impaired.     Vital Signs: BP (!) 179/99   Pulse 76   Temp 97.7 F  (36.5 C) (Oral)   Resp 17   Ht 5' (1.524 m)   Wt 67.8 kg   SpO2 95%   BMI 29.19 kg/m  Pain Scale: 0-10 POSS *See Group Information*: 1-Acceptable,Awake and alert Pain Score: 0-No pain   SpO2: SpO2: 95 % O2 Device:SpO2: 95 % O2 Flow Rate: .O2 Flow Rate (L/min): 2 L/min  IO: Intake/output summary:   Intake/Output Summary (Last 24 hours) at 01/02/2018 1447 Last data filed at 01/01/2018 1700 Gross per 24 hour  Intake 1250 ml  Output -  Net 1250 ml    LBM: Last BM Date: 01/01/18 Baseline Weight: Weight: 66.2 kg Most recent weight: Weight: 67.8 kg     Palliative Assessment/Data: PPS 30%     Time In/Out: 1100-1125, 1400-1410 Time Total: 28mn Greater than 50%  of this time was spent counseling and coordinating care related to the above assessment and plan.  Signed by:  MIhor Dow FNP-C Palliative Medicine Team  Phone: [phone]: [phone]  Please contact Palliative Medicine Team phone at [phone] questions and concerns.  For individual provider: See AShea Evans

## 2018-01-02 NOTE — Progress Notes (Signed)
Initial Nutrition Assessment  DOCUMENTATION CODES:  Not applicable  INTERVENTION:  Ensure Enlive po BID, each supplement provides 350 kcal and 20 grams of protein  Outcome of palliative/speech therapy consults will determine subsequent interventions. Will follow  NUTRITION DIAGNOSIS:  Inadequate oral intake related to lethargy/confusion as evidenced by per staff report.  GOAL:  Patient will meet greater than or equal to 90% of their needs  MONITOR:  Diet advancement, Weight trends, Labs, I & O's, Skin  REASON FOR ASSESSMENT:  LOS    ASSESSMENT:  67 y/o female Pmhx chronic cocaine, tobacco and etoh use,  DM2, CAD, Hep C, Noncompliance, HLD, HTN. Presented to ED w/ complaints of HA, nausea, photophobia and chest pain. CT showed acute/subacute CVA. Admitted for management. MRI later revealed multiple acute infarcts  Discussed pt in progression this morning. Pt with poor intake. She had worsening confusion/agitation several days after she was admitted that has not improved. She was also found to have Aspiration PNA.   Nutrition wise, pt was NPO x8 days (10/2-10/10). Per nursing and dietary staff today, pt has had minimal intake since her diet was advanced. The NT who fed pt today notes pt only ate parts of her sweet potato & exhibited coughing with intake. On the other hand, pt consumed liquids w/o any visible signs of choking/aspiration. There is a SLP eval currently ordered.   RD attempted to speak to patient and elicit why she has not been eating. All pt responds with are requests for RD to contact either her father or daughter so they "can come pick me up".   RD emphasized that her poor nutrition is a  a large barrier to discharge. RD noted that a feeding tube may be considererd if she doesn't eat. RD asked what she thought of this. She says "I dont want that...Marland KitchenMarland Kitchen just call my daughter".   Wt wise, bed wt today is 67.8 kg. Per chart, pt was admitted at a weight of 143.3 lbs. Her UBW  range this year has been 135-145 lbs. Insufficient criteria for malnutrition dx at this time.   Given pt seems to tolerate liquids well, RD will order Ensure ENlive, though feel that cognition is largely the issue as opposed to appetite/diet tolerance.  Per care team discussion, it has been unclear how much of her AMS is r/t cva vs withdrawal vs infection. D/t ongoing encephalopathy, Neurology was re consulted yesterday and felt this may be pts new mental baseline- Pt may end up needing peg if aggressive care is desired. Palliative consult has been ordered today by MD. Will monitor outcome of Palliative/SLP consults as well as meal records/ensure intake  Labs: Bgs: 105-130, Creat: 1.36, Albumin: 2.7 Meds: Folate, insulin, MVI, thiamin, IVF  Recent Labs  Lab 12/28/17 0436 12/29/17 0442 12/30/17 0643 12/31/17 0418 01/01/18 0403  NA 144 145 148* 149* 141  K 3.8 3.9 3.5 3.6 3.3*  CL 113* 113* 114* 115* 109  CO2 24 23 23 24 25   BUN 28* 26* 24* 24* 18  CREATININE 1.31* 1.28* 1.33* 1.45* 1.36*  CALCIUM 8.8* 8.6* 8.9 8.5* 8.3*  MG 2.2 2.0 2.0  --   --   GLUCOSE 111* 105* 97 114* 173*   NUTRITION - FOCUSED PHYSICAL EXAM:   Most Recent Value  Orbital Region  No depletion  Upper Arm Region  No depletion  Thoracic and Lumbar Region  No depletion  Buccal Region  No depletion  Temple Region  No depletion  Clavicle Bone Region  No  depletion  Clavicle and Acromion Bone Region  No depletion  Scapular Bone Region  No depletion  Dorsal Hand  Unable to assess  Patellar Region  No depletion  Anterior Thigh Region  No depletion  Posterior Calf Region  No depletion  Edema (RD Assessment)  None  Hair  Reviewed  Eyes  Reviewed  Mouth  Reviewed  Skin  Reviewed  Nails  Reviewed     Diet Order:   Diet Order            Diet Heart Room service appropriate? Yes; Fluid consistency: Thin  Diet effective now             EDUCATION NEEDS:  No education needs have been identified at this  time  Skin:  Skin Assessment: Reviewed RN Assessment  Last BM:  12/11  Height:  Ht Readings from Last 1 Encounters:  12/23/17 5' (1.524 m)   Weight:  Wt Readings from Last 1 Encounters:  01/02/18 67.8 kg   Wt Readings from Last 10 Encounters:  01/02/18 67.8 kg  12/11/17 66.2 kg  10/29/17 63 kg  09/05/17 63.5 kg  09/05/17 63.5 kg  05/31/17 65.8 kg  05/22/17 62.9 kg  02/08/17 62.6 kg  01/31/17 62.4 kg  11/18/16 63.8 kg   Ideal Body Weight:  45.45 kg  BMI:  Body mass index is 29.19 kg/m.  Estimated Nutritional Needs:  Kcal:  1550-1750 kcals (23-26 kcal/kg bw) Protein:  75-88g pro (~20% energy needs) Fluid:  1.6-1.8 L fluid (1 ml/kcal)  Burtis Junes RD, LDN, CNSC Clinical Nutrition Available Tues-Sat via Pager: [phone] 01/02/2018 1:36 PM

## 2018-01-02 NOTE — Progress Notes (Signed)
EEG completed; results pending. Notified Dr. Merlene Laughter

## 2018-01-02 NOTE — Progress Notes (Signed)
PROGRESS NOTE  Cassandra Jordan  EHU:[id number]  DOB: Dec 03, 1950  DOA: 12/23/2017 PCP: [facility]  Brief Admission Hx: 67 y.o. female chronic cocaine abuser, smoker, diabetic, with known cerebrovascular and heart disease presented to ED with complaints of a frontal headache and acute CVA.    MDM/Assessment & Plan:   1. Acute CVA - MRI reveals multiple acute infarcts in deep white matter and along the left PICA territory.  Pt is on CVA treatment protocol.  Neuro recommends dual antiplatelet therapy.  Avoid abrupt and rapid drop in blood pressure.  Managing blood pressure has been challenging as she has been unable to take anything by mouth.  2. 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  We will plan on discussing with vascular surgery when she has stabilized.  Neurology has recommended 30 day event monitor and this is been arranged through cardiology.   3. Dyslipidemia - LDL of 181 - continue atorvastatin 80 mg daily when able to take medications by mouth. 4. Prolonged QTc - monitoring on telemetry.  Monitor lytes.   5. Aspiration pneumonia -she is completed a course of intravenous Unasyn.  SLP evaluation requested as her mental status is improving enough for an evaluation.   6. Acute respiratory distress -resolved now.  Pt briefly required CPAP/BiPAP.  Pulmonology followed.  She is now off nasal cannula and has oxygen saturations in the 90s. 7. COPD - Stable.  No wheezing at this time.  Continue bronchodilators as needed 8. Acute alcohol / recreational drug withdrawal-resolved. Ativan and morphine have been discontinued. 9. Essential hypertension -blood pressures remain elevated.  Her p.o. medications are being held since her mental status is still lethargic.  Once mental status improves, can restart oral medications 10. PSVT - cardiology has restarted patient on her  home amiodarone and carvedilol.  Unfortunately, these medications have been held due to n.p.o. status.  Will restart them as her mental status tolerates. 11. Type 2 DM - CBG Q4h while NPO plus SSI coverage. A1c is 6.7% which is at ADA goal.  12. Cocaine abuse - Social worker consulted to discuss treatment options with patient.   13. Acute encephalopathy - This all seems to be related to her strokes.  She was seen again by neurology who agreed.  Repeat CT did not show any further findings to suggest worsening of cerebrovascular disease.  EEG is pending for today.  This is likely her new baseline per neurology.  I have asked for a palliative care consult to start goals of care discussions with family.   14. Hypernatremia - Sodium has improved with hypotonic fluids.  DVT prophylaxis: lovenox Code Status: Full  Family Communication: Discussed with daughter, [name] over the phone.  Explained that if patient's condition does not improve and her prognosis is poor, we will need to have further conversations around goals of care and quality of life as well as alternative nutrition sources.  I have advised against PEG tube placement and her daughter seems understanding of this.  Neuro agrees that that is likely patient's new baseline.  Palliative medicine consulted for goals of care discussions.   Disposition Plan: SNF recommended  Consultants:  Neurology  Cardiology  Pulmonology  Antimicrobials:  Unasyn 12/5>12/10  Subjective: She remains somnolent.  Wakes up to voice, but does not answer questions or follow commands.  Attempts were made to feed the patient yesterday.  She held the applesauce  in her mouth and did not swallow it.  Has not had any nutritional intake in several days.  Objective: Vitals:   01/02/18 0400 01/02/18 0426 01/02/18 0500 01/02/18 0600  BP: (!) 158/95  (!) 177/97 (!) 143/84  Pulse: 76  71 75  Resp: (!) 31  15 19   Temp:  97.7 F (36.5 C)    TempSrc:  Oral    SpO2: 98%   100% 94%  Weight:  68.8 kg    Height:        Intake/Output Summary (Last 24 hours) at 01/02/2018 0837 Last data filed at 01/01/2018 1700 Gross per 24 hour  Intake 1250 ml  Output -  Net 1250 ml   Filed Weights   12/31/17 0500 01/01/18 0500 01/02/18 0426  Weight: 65.8 kg 65.5 kg 68.8 kg   Exam:  General exam: Somnolent, responds to voice, does not follow commands. Respiratory system: Clear to auscultation. Respiratory effort normal. Cardiovascular system:RRR. No murmurs, rubs, gallops. Gastrointestinal system: Abdomen is nondistended, soft and nontender. No organomegaly or masses felt. Normal bowel sounds heard. Central nervous system:.  Moving all extremities spontaneously.  Has left-sided facial droop Extremities: No C/C/E, +pedal pulses Skin: No rashes, lesions or ulcers Psychiatry: Speech is incoherent, babbles but does not open eyes and engage in conversation  Data Reviewed: Basic Metabolic Panel: Recent Labs  Lab 12/27/17 0455 12/28/17 0436 12/29/17 0442 12/30/17 0643 12/31/17 0418 01/01/18 0403  NA 142 144 145 148* 149* 141  K 4.9 3.8 3.9 3.5 3.6 3.3*  CL 113* 113* 113* 114* 115* 109  CO2 15* 24 23 23 24 25   GLUCOSE 96 111* 105* 97 114* 173*  BUN 32* 28* 26* 24* 24* 18  CREATININE 1.69* 1.31* 1.28* 1.33* 1.45* 1.36*  CALCIUM 8.3* 8.8* 8.6* 8.9 8.5* 8.3*  MG 2.5* 2.2 2.0 2.0  --   --    Liver Function Tests: Recent Labs  Lab 12/28/17 0436 12/29/17 0442 12/30/17 0643 12/31/17 0418  AST 58* 51* 45* 42*  ALT 23 26 25 24   ALKPHOS 39 40 41 39  BILITOT 0.7 0.8 1.0 0.8  PROT 7.1 7.3 7.7 7.4  ALBUMIN 2.6* 2.7* 2.8* 2.7*   No results for input(s): LIPASE, AMYLASE in the last 168 hours. No results for input(s): AMMONIA in the last 168 hours. CBC: Recent Labs  Lab 12/27/17 0615 12/28/17 0436 12/29/17 0442 12/30/17 0643 12/31/17 0418  WBC 10.5 14.1* 10.7* 10.9* 10.2  NEUTROABS 7.3 8.9* 7.2 6.6 5.8  HGB 14.8 10.4* 10.1* 10.4* 10.0*  HCT 48.1* 34.2*  33.2* 34.1* 33.2*  MCV 91.6 92.2 90.5 90.7 91.0  PLT 211 346 350 371 356   Cardiac Enzymes: No results for input(s): CKTOTAL, CKMB, CKMBINDEX, TROPONINI in the last 168 hours. CBG (last 3)  Recent Labs    01/01/18 2340 01/02/18 0415 01/02/18 0740  GLUCAP 105* 117* 121*   Recent Results (from the past 240 hour(s))  MRSA PCR Screening     Status: None   Collection Time: 12/23/17 12:20 PM  Result Value Ref Range Status   MRSA by PCR NEGATIVE NEGATIVE Final    Comment:        The GeneXpert MRSA Assay (FDA approved for NASAL specimens only), is one component of a comprehensive MRSA colonization surveillance program. It is not intended to diagnose MRSA infection nor to guide or monitor treatment for MRSA infections. Performed at [facility], [address] [place], [place] [address]      Studies: Ct Head Wo  Contrast  Result Date: 01/01/2018 CLINICAL DATA:  67 y/o F; sudden change in mental status. History of stroke. EXAM: CT HEAD WITHOUT CONTRAST TECHNIQUE: Contiguous axial images were obtained from the base of the skull through the vertex without intravenous contrast. COMPARISON:  12/23/2017 MRI head. 12/23/2017 CT head. FINDINGS: Brain: Motion degraded study. Multiple foci of hypoattenuation within the right frontal white matter corresponding to acute infarctions on the prior MRI of the brain grossly stable in distribution. Stable distribution of left inferior cerebellar hemisphere infarction. No interval hemorrhage or change in mass effect identified. No new stroke, extra-axial collection, hydrocephalus, or herniation. Stable background of chronic microvascular ischemic changes and volume loss of the brain. Vascular: Calcific atherosclerosis of carotid siphons. No hyperdense vessel identified. Skull: Normal. Negative for fracture or focal lesion. Sinuses/Orbits: Small right maxillary sinus fluid level. Normal aeration of additional visible paranasal sinuses and the mastoid air  cells. Bilateral intra-ocular lens replacement. Other: None. IMPRESSION: 1. Motion artifact. 2. Stable distribution of white matter infarcts in the right frontal lobe and infarct within the left inferior cerebellar hemisphere. No interval hemorrhage or change in mass effect identified on this motion degraded study. 3. Stable chronic microvascular ischemic changes and volume loss of the brain. Electronically Signed   By: Kristine Garbe M.D.   On: 01/01/2018 23:09   Scheduled Meds: . amiodarone  100 mg Oral Daily  . amLODipine  5 mg Oral Daily  . aspirin  325 mg Oral Daily   Or  . aspirin  300 mg Rectal Daily  . atorvastatin  80 mg Oral q1800  . carvedilol  6.25 mg Oral BID WC  . cloNIDine  0.1 mg Oral BID  . clopidogrel  75 mg Oral Q breakfast  . enoxaparin (LOVENOX) injection  40 mg Subcutaneous Q24H  . folic acid  1 mg Oral Daily  . Influenza vac split quadrivalent PF  0.5 mL Intramuscular Tomorrow-1000  . insulin aspart  0-9 Units Subcutaneous Q4H  . mouth rinse  15 mL Mouth Rinse BID  . multivitamin with minerals  1 tablet Oral Daily  . thiamine  100 mg Oral Daily   Or  . thiamine  100 mg Intravenous Daily   Continuous Infusions: . sodium chloride 10 mL/hr at 12/29/17 2015  . dextrose 5 % with KCl 20 mEq / L      Principal Problem:   Acute CVA (cerebrovascular accident) ([place]) Active Problems:   Essential hypertension   PSVT (paroxysmal supraventricular tachycardia) ([facility])   Chest pain   Diabetes mellitus, type II ([facility])   Tobacco abuse   Cocaine abuse ([place])   Generalized weakness   Hepatic cirrhosis ([facility])   GERD (gastroesophageal reflux disease)   Leukocytosis   Hypomagnesemia   Uncontrolled type 2 diabetes mellitus with hyperglycemia, with long-term current use of insulin (HCC)   CKD (chronic kidney disease), stage III ([facility])   Abnormal EKG   Cerebellar stroke [facility])  Critical Care Time spent: 31 minutes  Irwin Brakeman, MD [facility] Pager  [phone]  If 7PM-7AM, please contact night-coverage [email or web address] Password [facility] 01/02/2018, 8:37 AM    LOS: 10 days

## 2018-01-02 NOTE — Evaluation (Signed)
Clinical/Bedside Swallow Evaluation Patient Details  Name: Cassandra Jordan MRN: [id number] Date of Birth: Oct 21, 1950  Today's Date: 01/02/2018 Time: SLP Start Time (ACUTE ONLY): 49 SLP Stop Time (ACUTE ONLY): 2774 SLP Time Calculation (min) (ACUTE ONLY): 24 min  Past Medical History:  Past Medical History:  Diagnosis Date  . Alcohol abuse   . Arthritis   . Asthma   . Cocaine abuse (Cassandra Jordan)   . Essential hypertension   . GERD (gastroesophageal reflux disease)   . Hepatitis C   . Hyperlipidemia   . Lichen sclerosus et atrophicus 10/13/2014  . Neuropathic pain   . Noncompliance   . PSVT (paroxysmal supraventricular tachycardia) (Cassandra Jordan)   . Rash   . Renal carcinoma (Cassandra Jordan)   . Sleep apnea   . Type 2 diabetes mellitus (Cassandra Jordan)   . Vaginal irritation 10/01/2014  . Vaginal itching 10/01/2014  . Yeast infection of the vagina 10/01/2014   Past Surgical History:  Past Surgical History:  Procedure Laterality Date  . ABDOMINAL HYSTERECTOMY    . COLONOSCOPY N/A 07/08/2013   RMR: Multiple colonic polyps treated Cassandra Jordan as described above. Inadequate prepartation comprimised examination there colonic diverticulosis. tubular and tubulovillous adenomas. next TCS 12/2013  . COLONOSCOPY N/A 02/09/2015   RMR: Colonic polyp removed as described above. Tubular adenoma. Next colonoscopy January 2022.  . ESOPHAGOGASTRODUODENOSCOPY N/A 07/08/2013   RMR: Schatzki's ring ; small hiatal hernia-status post passage of a Maloney dilator  . MALONEY DILATION N/A 07/08/2013   Procedure: [name] DILATION;  Surgeon: Cassandra Dolin, MD;  Location: [facility] SUITE;  Service: Endoscopy;  Laterality: N/A;  . ROBOT ASSISTED LAPAROSCOPIC NEPHRECTOMY Right 10/14/2013   Procedure: ROBOTIC ASSISTED LAPAROSCOPIC RADICAL NEPHRECTOMY;  Surgeon: Cassandra Frock, MD;  Location: [facility] ORS;  Service: Urology;  Laterality: Right;  . SAVORY DILATION N/A 07/08/2013   Procedure: SAVORY DILATION;  Surgeon: Cassandra Dolin, MD;  Location: [facility] SUITE;   Service: Endoscopy;  Laterality: N/A;  . TONSILLECTOMY    . Wart removal     HPI:  67 y.o. female chronic cocaine abuser, smoker, diabetic, with known cerebrovascular and heart disease presented to ED with complaints of a frontal headache and acute CVA. Acute CVA - MRI reveals multiple acute infarcts in deep white matter and along the left PICA territory.  Pt is on CVA treatment protocol.  Neuro recommends dual antiplatelet therapy.  Avoid abrupt and rapid drop in blood pressure.  Managing blood pressure has been challenging as she has been unable to take anything by mouth. Aspiration pneumonia -she is completed a course of intravenous Unasyn.  SLP evaluation requested as her mental status is improving enough for an evaluation. Acute alcohol / recreational drug withdrawal-resolved. Ativan and morphine have been discontinued. Acute encephalopathy - This all seems to be related to her strokes.  She was seen again by neurology who agreed.  Repeat CT did not show any further findings to suggest worsening of cerebrovascular disease.  EEG is pending for today.  This is likely her new baseline per neurology.  MD asked for a palliative care consult to start goals of care discussions with family. BSE requested.   Assessment / Plan / Recommendation Clinical Impression  Clinical swallow evaluation completed at bedside and oral care provided. Pt with coated tongue which improved with oral care, however RN alerted to monitor for signs of thrush. Pt presenting with confusion and demanding her car, however verbalized that she was in the hospital because of a stroke. Pt presents with mild/mod left  facial asymmetry, strong cough, and inability to generate volitional swallow due to cognitive perseveration on coughing from previous request.   Pt without signs of reduced airway protection, however does present with oral phase deficits with oral holding and pocketing due to cognitive deficits and left side weakness. Pt  benefited from verbal cues to swallow and alternating solids and liquids. Pt's dentition in poor repair and she endorses painful gums and requests soft foods. Recommend D2/chopped with thin liquids with aspiration precautions, supervision with meals, oral care before and after meals, and po meds crushed as able in puree. SLP to follow during acute stay. Recommend SNF for follow up.   SLP Visit Diagnosis: Dysphagia, unspecified (R13.10)    Aspiration Risk  Mild aspiration risk    Diet Recommendation Dysphagia 2 (Fine chop);Thin liquid   Liquid Administration via: Cup Medication Administration: Crushed with puree Supervision: Staff to assist with self feeding;Full supervision/cueing for compensatory strategies Compensations: Slow rate;Small sips/bites;Minimize environmental distractions;Lingual sweep for clearance of pocketing;Follow solids with liquid Postural Changes: Seated upright at 90 degrees;Remain upright for at least 30 minutes after po intake    Other  Recommendations Oral Care Recommendations: Oral care BID;Staff/trained caregiver to provide oral care Other Recommendations: Clarify dietary restrictions   Follow up Recommendations Skilled Nursing facility      Frequency and Duration min 2x/week  1 week       Prognosis Prognosis for Safe Diet Advancement: Fair Barriers to Reach Goals: Cognitive deficits      Swallow Study   General Date of Onset: 12/23/17 HPI: 67 y.o. female chronic cocaine abuser, smoker, diabetic, with known cerebrovascular and heart disease presented to ED with complaints of a frontal headache and acute CVA. Acute CVA - MRI reveals multiple acute infarcts in deep white matter and along the left PICA territory.  Pt is on CVA treatment protocol.  Neuro recommends dual antiplatelet therapy.  Avoid abrupt and rapid drop in blood pressure.  Managing blood pressure has been challenging as she has been unable to take anything by mouth. Aspiration pneumonia -she is  completed a course of intravenous Unasyn.  SLP evaluation requested as her mental status is improving enough for an evaluation. Acute alcohol / recreational drug withdrawal-resolved. Ativan and morphine have been discontinued. Acute encephalopathy - This all seems to be related to her strokes.  She was seen again by neurology who agreed.  Repeat CT did not show any further findings to suggest worsening of cerebrovascular disease.  EEG is pending for today.  This is likely her new baseline per neurology.  MD asked for a palliative care consult to start goals of care discussions with family. BSE requested. Type of Study: Bedside Swallow Evaluation Diet Prior to this Study: Regular;Thin liquids Temperature Spikes Noted: No Respiratory Status: Room air History of Recent Intubation: No Behavior/Cognition: Alert;Cooperative;Requires cueing Oral Cavity Assessment: (lingual coating) Oral Care Completed by SLP: Yes Oral Cavity - Dentition: Poor condition(Pt c/o painful gums) Vision: Functional for self-feeding Self-Feeding Abilities: Needs set up;Needs assist Patient Positioning: Upright in bed Baseline Vocal Quality: Normal Volitional Cough: Strong Volitional Swallow: Unable to elicit(due to cognition, she perseverated on coughing)    Oral/Motor/Sensory Function Overall Oral Motor/Sensory Function: Moderate impairment Facial ROM: Reduced left Facial Symmetry: Abnormal symmetry left;Suspected CN VII (facial) dysfunction Facial Strength: Reduced left;Suspected CN VII (facial) dysfunction Lingual ROM: Within Functional Limits Lingual Symmetry: Within Functional Limits Lingual Strength: Within Functional Limits Velum: Within Functional Limits Mandible: Within Functional Limits   Ice Chips Ice chips: Within  functional limits Presentation: Spoon(attempted to expectorate)   Thin Liquid Thin Liquid: Within functional limits Presentation: Cup;Self Fed;Spoon;Straw    Nectar Thick Nectar Thick Liquid:  Not tested   Honey Thick Honey Thick Liquid: Not tested   Puree Puree: Impaired Presentation: Spoon Oral Phase Impairments: Reduced lingual movement/coordination;Poor awareness of bolus Oral Phase Functional Implications: Oral residue;Prolonged oral transit   Solid     Solid: Impaired Presentation: Spoon Oral Phase Impairments: Reduced lingual movement/coordination;Impaired mastication;Poor awareness of bolus Oral Phase Functional Implications: Oral residue;Prolonged oral transit;Oral holding     Thank you,  Genene Churn, [place]  Cacie Gaskins 01/02/2018,5:05 PM

## 2018-01-02 NOTE — Care Management Note (Signed)
Case Management Note  Patient Details  Name: ANDREANA KLINGERMAN MRN: [id number] Date of Birth: 1950-04-02   If discussed at [facility] Length of Stay Meetings, dates discussed:  01/02/2018    Additional Comments:  Kenslee Achorn, Chauncey [place], RN 01/02/2018, 3:34 PM

## 2018-01-03 LAB — CBC
HCT: 35.3 % — ABNORMAL LOW (ref 36.0–46.0)
Hemoglobin: 10.8 g/dL — ABNORMAL LOW (ref 12.0–15.0)
MCH: 27.1 pg (ref 26.0–34.0)
MCHC: 30.6 g/dL (ref 30.0–36.0)
MCV: 88.5 fL (ref 80.0–100.0)
Platelets: 349 10*3/uL (ref 150–400)
RBC: 3.99 MIL/uL (ref 3.87–5.11)
RDW: 13.6 % (ref 11.5–15.5)
WBC: 12.6 10*3/uL — ABNORMAL HIGH (ref 4.0–10.5)
nRBC: 0 % (ref 0.0–0.2)

## 2018-01-03 LAB — BASIC METABOLIC PANEL
Anion gap: 7 (ref 5–15)
BUN: 12 mg/dL (ref 8–23)
CO2: 24 mmol/L (ref 22–32)
Calcium: 8.6 mg/dL — ABNORMAL LOW (ref 8.9–10.3)
Chloride: 107 mmol/L (ref 98–111)
Creatinine, Ser: 1.39 mg/dL — ABNORMAL HIGH (ref 0.44–1.00)
GFR calc Af Amer: 46 mL/min — ABNORMAL LOW (ref >60)
GFR calc non Af Amer: 39 mL/min — ABNORMAL LOW (ref >60)
Glucose, Bld: 125 mg/dL — ABNORMAL HIGH (ref 70–99)
POTASSIUM: 3.5 mmol/L (ref 3.5–5.1)
Sodium: 138 mmol/L (ref 135–145)

## 2018-01-03 LAB — GLUCOSE, CAPILLARY
GLUCOSE-CAPILLARY: 112 mg/dL — AB (ref 70–99)
Glucose-Capillary: 101 mg/dL — ABNORMAL HIGH (ref 70–99)
Glucose-Capillary: 103 mg/dL — ABNORMAL HIGH (ref 70–99)
Glucose-Capillary: 117 mg/dL — ABNORMAL HIGH (ref 70–99)
Glucose-Capillary: 119 mg/dL — ABNORMAL HIGH (ref 70–99)
Glucose-Capillary: 162 mg/dL — ABNORMAL HIGH (ref 70–99)

## 2018-01-03 LAB — TSH: TSH: 1.279 u[IU]/mL (ref 0.350–4.500)

## 2018-01-03 LAB — RPR: RPR Ser Ql: NONREACTIVE

## 2018-01-03 LAB — HOMOCYSTEINE: Homocysteine: 17.3 umol/L — ABNORMAL HIGH (ref 0.0–15.0)

## 2018-01-03 LAB — MAGNESIUM: Magnesium: 1.7 mg/dL (ref 1.7–2.4)

## 2018-01-03 MED ORDER — INSULIN ASPART 100 UNIT/ML ~~LOC~~ SOLN
0.0000 [IU] | Freq: Three times a day (TID) | SUBCUTANEOUS | Status: DC
  Administered 2018-01-03: 2 [IU] via SUBCUTANEOUS

## 2018-01-03 MED ORDER — POTASSIUM CL IN DEXTROSE 5% 20 MEQ/L IV SOLN
20.0000 meq | INTRAVENOUS | Status: DC
  Administered 2018-01-04 – 2018-01-05 (×3): 20 meq via INTRAVENOUS
  Filled 2018-01-03 (×5): qty 1000

## 2018-01-03 MED ORDER — LORAZEPAM 1 MG PO TABS
1.0000 mg | ORAL_TABLET | ORAL | Status: DC | PRN
  Administered 2018-01-03 – 2018-01-09 (×18): 1 mg via ORAL
  Filled 2018-01-03 (×18): qty 1

## 2018-01-03 MED ORDER — POTASSIUM CL IN DEXTROSE 5% 20 MEQ/L IV SOLN
20.0000 meq | INTRAVENOUS | Status: DC
  Filled 2018-01-03 (×7): qty 1000

## 2018-01-03 NOTE — Care Management Important Message (Signed)
Important Message  Patient Details  Name: Cassandra Jordan MRN: [id number] Date of Birth: 1950-12-28   Medicare Important Message Given:  Yes    Shelda Altes 01/03/2018, 1:56 PM

## 2018-01-03 NOTE — Progress Notes (Signed)
Obtained reliable BP, treated with PRN medication, Please see MAR.

## 2018-01-03 NOTE — [place] FL2 (Signed)
[place] MEDICAID FL2 LEVEL OF CARE SCREENING TOOL     IDENTIFICATION  Patient Name: Cassandra Jordan Birthdate: 07-24-1950 Sex: female Admission Date (Current Location): 12/23/2017  [facility] and [place] Number:  [employer] and Address:  [facility] [address], [place]      Provider Number: [id number]  Attending Physician Name and Address:  Murlean Iba, MD  Relative Name and Phone Number:       Current Level of Care: Hospital Recommended Level of Care: [place] Prior Approval Number:    Date Approved/Denied:   PASRR Number:    Discharge Plan: SNF    Current Diagnoses: Patient Active Problem List   Diagnosis Date Noted  . Palliative care by specialist   . Cerebellar stroke ([place])   . Abnormal EKG 12/23/2017  . Hyperphosphatemia 05/22/2017  . Acute CVA (cerebrovascular accident) ([place]) 05/21/2017  . Abdominal pain 11/18/2016  . Cholelithiasis 11/18/2016  . Uncontrolled type 2 diabetes mellitus with hyperglycemia, with long-term current use of insulin ([place]) 11/18/2016  . CKD (chronic kidney disease), stage III ([place]) 11/18/2016  . Acute pancreatitis   . Ileus ([place])   . Porcelain gallbladder   . Acute renal failure superimposed on stage 3 chronic kidney disease ([place]) 11/04/2016  . Hypokalemia 11/01/2016  . Hypomagnesemia 11/01/2016  . SBO (small bowel obstruction) ([place])   . AKI (acute kidney injury) ([place]) 10/30/2016  . Leukocytosis 10/30/2016  . GERD (gastroesophageal reflux disease) 10/29/2016  . Small bowel obstruction ([place]) 10/29/2016  . History of drug use 07/17/2016  . Constipation 05/30/2016  . Fecal incontinence 01/04/2016  . Hepatic cirrhosis ([place]) 04/08/2015  . History of adenomatous polyp of colon   . History of colonic polyps   . Hx of adenomatous colonic polyps 12/10/2014  . Lichen sclerosus et atrophicus 10/13/2014  . Vaginal itching 10/01/2014  . Vaginal irritation 10/01/2014  . Yeast  infection of the vagina 10/01/2014  . Blurred vision, right eye 03/20/2014  . Cocaine abuse ([place]) 03/20/2014  . Generalized weakness 03/20/2014  . Chest pain 02/10/2014  . Chest pain at rest 02/10/2014  . Type 2 diabetes mellitus ([place])   . Diabetes mellitus, type II ([place])   . Tobacco abuse   . PSVT (paroxysmal supraventricular tachycardia) ([place]) 11/04/2013  . Essential hypertension 10/16/2013  . Renal mass 10/14/2013  . Ovarian mass, left 08/17/2013  . Hepatitis C, chronic ([place]) 07/01/2013  . Elevated LFTs 07/01/2013  . Other dysphagia 07/01/2013  . Goals of care, counseling/discussion 07/01/2013    Orientation RESPIRATION BLADDER Height & Weight     Self  Normal Continent Weight: 66.4 kg Height:  5' (152.4 cm)  BEHAVIORAL SYMPTOMS/MOOD NEUROLOGICAL BOWEL NUTRITION STATUS  (none) (none) Continent (DYS 2)  AMBULATORY STATUS COMMUNICATION OF NEEDS Skin   Extensive Assist Verbally Normal                       Personal Care Assistance Level of Assistance  Bathing, Feeding, Dressing Bathing Assistance: Maximum assistance Feeding assistance: Limited assistance Dressing Assistance: Maximum assistance     Functional Limitations Info  Sight, Hearing, Speech Sight Info: Adequate Hearing Info: Adequate Speech Info: Adequate    SPECIAL CARE FACTORS FREQUENCY  PT (By licensed PT)     PT Frequency: 5X/W              Contractures Contractures Info: Not present    Additional Factors Info  Code Status, Allergies Code Status Info:  full Allergies Info: Sulfa Antibiotics           Current Medications (01/03/2018):  This is the current hospital active medication list Current Facility-Administered Medications  Medication Dose Route Frequency Provider Last Rate Last Dose  . 0.9 %  sodium chloride infusion   Intravenous PRN Kathie Dike, MD 10 mL/hr at 12/29/17 2015    . acetaminophen (TYLENOL) tablet 650 mg  650 mg Oral Q4H PRN Johnson, Clanford L, MD   650 mg at  12/24/17 0529   Or  . acetaminophen (TYLENOL) solution 650 mg  650 mg Per Tube Q4H PRN Johnson, Clanford L, MD       Or  . acetaminophen (TYLENOL) suppository 650 mg  650 mg Rectal Q4H PRN Wynetta Emery, Clanford L, MD   650 mg at 12/28/17 0242  . albuterol (PROVENTIL) (2.5 MG/3ML) 0.083% nebulizer solution 2.5 mg  2.5 mg Nebulization Q4H PRN Johnson, Clanford L, MD      . amiodarone (PACERONE) tablet 100 mg  100 mg Oral Daily Satira Sark, MD   100 mg at 01/03/18 0800  . amLODipine (NORVASC) tablet 5 mg  5 mg Oral Daily Kathie Dike, MD   5 mg at 01/03/18 0800  . aspirin tablet 325 mg  325 mg Oral Daily Johnson, Clanford L, MD   325 mg at 01/03/18 0759   Or  . aspirin suppository 300 mg  300 mg Rectal Daily Johnson, Clanford L, MD   300 mg at 01/02/18 1205  . atorvastatin (LIPITOR) tablet 80 mg  80 mg Oral q1800 Satira Sark, MD   80 mg at 12/25/17 1727  . carvedilol (COREG) tablet 6.25 mg  6.25 mg Oral BID WC Satira Sark, MD   6.25 mg at 01/03/18 0800  . cloNIDine (CATAPRES - Dosed in mg/24 hr) patch 0.1 mg  0.1 mg Transdermal Weekly Johnson, Clanford L, MD   0.1 mg at 01/02/18 1531  . clopidogrel (PLAVIX) tablet 75 mg  75 mg Oral Q breakfast Phillips Odor, MD   75 mg at 01/03/18 0759  . dextrose 5 % with KCl 20 mEq / L  infusion  20 mEq Intravenous Continuous Wynetta Emery, Clanford L, MD 50 mL/hr at 01/03/18 1208    . enoxaparin (LOVENOX) injection 40 mg  40 mg Subcutaneous Q24H Kathie Dike, MD   40 mg at 01/03/18 1447  . feeding supplement (ENSURE ENLIVE) (ENSURE ENLIVE) liquid 237 mL  237 mL Oral BID BM Johnson, Clanford L, MD   237 mL at 01/03/18 1447  . folic acid (FOLVITE) tablet 1 mg  1 mg Oral Daily Kathie Dike, MD   1 mg at 01/03/18 0800  . hydrALAZINE (APRESOLINE) injection 10 mg  10 mg Intravenous Q4H PRN Wynetta Emery, Clanford L, MD   10 mg at 01/03/18 0609  . Influenza vac split quadrivalent PF (FLUZONE HIGH-DOSE) injection 0.5 mL  0.5 mL Intramuscular Tomorrow-1000  Murlean Iba, MD   Stopped at 12/24/17 1225  . insulin aspart (novoLOG) injection 0-9 Units  0-9 Units Subcutaneous TID WC Wynetta Emery, Clanford L, MD   2 Units at 01/03/18 1208  . LORazepam (ATIVAN) tablet 1 mg  1 mg Oral Q4H PRN Johnson, Clanford L, MD   1 mg at 01/03/18 1447  . MEDLINE mouth rinse  15 mL Mouth Rinse BID Johnson, Clanford L, MD   15 mL at 01/03/18 0802  . multivitamin with minerals tablet 1 tablet  1 tablet Oral Daily Vita Erm, NP   1 tablet  at 01/03/18 0759  . nitroGLYCERIN (NITROSTAT) SL tablet 0.4 mg  0.4 mg Sublingual Q5 min PRN Johnson, Clanford L, MD      . senna-docusate (Senokot-S) tablet 1 tablet  1 tablet Oral QHS PRN Johnson, Clanford L, MD      . thiamine (VITAMIN B-1) tablet 100 mg  100 mg Oral Daily Vita Erm, NP   100 mg at 12/25/17 0932   Or  . thiamine (B-1) injection 100 mg  100 mg Intravenous Daily Vita Erm, NP   100 mg at 01/03/18 7116     Discharge Medications: Please see discharge summary for a list of discharge medications.  Relevant Imaging Results:  Relevant Lab Results:   Additional Gr[place]bs, LCSW

## 2018-01-03 NOTE — Progress Notes (Signed)
PROGRESS NOTE  Cassandra Jordan  QQV:[id number]  DOB: [birth year]  DOA: 12/23/2017 PCP: [facility]  Brief Admission Hx: 67 y.o. female chronic cocaine abuser, smoker, diabetic, with known cerebrovascular and heart disease presented to ED with complaints of a frontal headache and acute CVA.    MDM/Assessment & Plan:   1. Acute CVA - MRI reveals multiple acute infarcts in deep white matter and along the left PICA territory.  Pt is on CVA treatment protocol.  Neuro recommends dual antiplatelet therapy.  Avoid abrupt and rapid drop in blood pressure. Pt is eating now and was able to take morning medications.   2. 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  We will plan on discussing with vascular surgery when she has stabilized.  Neurology has recommended 30 day event monitor and this is been arranged through cardiology.   3. Dyslipidemia - LDL of 181 - continue atorvastatin 80 mg daily when able to take medications by mouth. 4. Prolonged QTc - monitoring on telemetry.  Monitor lytes.  Repeat EKG 12/13 shows persistent QTc prolongation.  5. Aspiration pneumonia -she is completed a course of intravenous Unasyn.  SLP evaluation requested as her mental status is improving enough for an evaluation.   6. Acute respiratory distress -resolved now.  Pt briefly required CPAP/BiPAP.  Pulmonology followed.  She is now off nasal cannula and has oxygen saturations in the 90s. 7. COPD - Stable.  No wheezing at this time.  Continue bronchodilators as needed 8. Acute alcohol / recreational drug withdrawal-resolved. Ativan and morphine have been discontinued. 9. Essential hypertension -blood pressures remain elevated.  Now that she is eating she can hopefully take her oral medications.  10. PSVT - cardiology has restarted patient on her home amiodarone and carvedilol.  Unfortunately, these  medications have been held due to n.p.o. status.  Will restart them as her mental status tolerates. 11. Type 2 DM - CBG Q4h while NPO plus SSI coverage. A1c is 6.7% which is at ADA goal.  12. Cocaine abuse - Social worker consulted to discuss treatment options with patient.   13. Acute encephalopathy - Improving slowly.  This all seems to be related to her strokes.  She was seen again by neurology who agreed.  Repeat CT did not show any further findings to suggest worsening of cerebrovascular disease.  EEG is normal.   This is likely her new baseline per neurology.  I have asked for a palliative care consult to start goals of care discussions with family.  Lorazepam ordered as patient is restless and has not slept most of the night.   Unable to give antipsychotics due to persistently prolonged QT interval.  14. Hypernatremia - Sodium has improved with hypotonic fluids.  DVT prophylaxis: lovenox Code Status: Full  Family Communication: Discussed with daughter, [name] over the phone.  Explained that if patient's condition does not improve and her prognosis is poor, we will need to have further conversations around goals of care and quality of life as well as alternative nutrition sources.  I have advised against PEG tube placement and her daughter seems understanding of this.  Neuro agrees that that is likely patient's new baseline.  Palliative medicine consulted for goals of care discussions.   Disposition Plan: SNF recommended  Consultants:  Neurology  Cardiology  Pulmonology  Antimicrobials:  Unasyn 12/5>12/10  Subjective: Pt had a restless night but was able to  eat breakfast and took her morning meds.  She called out most of the night and was confused but could be redirected and oriented but short term memory is poor.    Objective: Vitals:   01/03/18 0609 01/03/18 0700 01/03/18 0730 01/03/18 0800  BP: (!) 197/99 (!) 171/82  (!) 157/90  Pulse:  92 88 87  Resp:  14 18 (!) 27  Temp:    (!) 97.5 F (36.4 C)   TempSrc:   Oral   SpO2:  100% 98% 100%  Weight:      Height:        Intake/Output Summary (Last 24 hours) at 01/03/2018 1052 Last data filed at 01/03/2018 0800 Gross per 24 hour  Intake 1952.41 ml  Output 1500 ml  Net 452.41 ml   Filed Weights   01/02/18 0426 01/02/18 1323 01/03/18 0600  Weight: 68.8 kg 67.8 kg 66.4 kg   Exam:  General exam: confused at times but could be re-oriented and answer questions appropriately. Respiratory system: Clear to auscultation. Respiratory effort normal. Cardiovascular system: normal s1,s2 sounds.  No murmurs, rubs, gallops. Gastrointestinal system: Abdomen is nondistended, soft and nontender. No organomegaly or masses felt. Normal bowel sounds heard. Central nervous system:.  Moving all extremities spontaneously.  persistent left-sided facial droop. Normal speech.  Extremities: No C/C/E, +pedal pulses Skin: No rashes, lesions or ulcers Psychiatry: disoriented.   Data Reviewed: Basic Metabolic Panel: Recent Labs  Lab 12/28/17 0436 12/29/17 0442 12/30/17 0643 12/31/17 0418 01/01/18 0403 01/03/18 0405  NA 144 145 148* 149* 141 138  K 3.8 3.9 3.5 3.6 3.3* 3.5  CL 113* 113* 114* 115* 109 107  CO2 24 23 23 24 25 24   GLUCOSE 111* 105* 97 114* 173* 125*  BUN 28* 26* 24* 24* 18 12  CREATININE 1.31* 1.28* 1.33* 1.45* 1.36* 1.39*  CALCIUM 8.8* 8.6* 8.9 8.5* 8.3* 8.6*  MG 2.2 2.0 2.0  --   --  1.7   Liver Function Tests: Recent Labs  Lab 12/28/17 0436 12/29/17 0442 12/30/17 0643 12/31/17 0418  AST 58* 51* 45* 42*  ALT 23 26 25 24   ALKPHOS 39 40 41 39  BILITOT 0.7 0.8 1.0 0.8  PROT 7.1 7.3 7.7 7.4  ALBUMIN 2.6* 2.7* 2.8* 2.7*   No results for input(s): LIPASE, AMYLASE in the last 168 hours. No results for input(s): AMMONIA in the last 168 hours. CBC: Recent Labs  Lab 12/28/17 0436 12/29/17 0442 12/30/17 0643 12/31/17 0418 01/03/18 0405  WBC 14.1* 10.7* 10.9* 10.2 12.6*  NEUTROABS 8.9* 7.2 6.6 5.8   --   HGB 10.4* 10.1* 10.4* 10.0* 10.8*  HCT 34.2* 33.2* 34.1* 33.2* 35.3*  MCV 92.2 90.5 90.7 91.0 88.5  PLT 346 350 371 356 349   Cardiac Enzymes: No results for input(s): CKTOTAL, CKMB, CKMBINDEX, TROPONINI in the last 168 hours. CBG (last 3)  Recent Labs    01/03/18 0006 01/03/18 0401 01/03/18 0722  GLUCAP 103* 117* 112*   No results found for this or any previous visit (from the past 240 hour(s)).   Studies: Ct Head Wo Contrast  Result Date: 01/01/2018 CLINICAL DATA:  67 y/o F; sudden change in mental status. History of stroke. EXAM: CT HEAD WITHOUT CONTRAST TECHNIQUE: Contiguous axial images were obtained from the base of the skull through the vertex without intravenous contrast. COMPARISON:  12/23/2017 MRI head. 12/23/2017 CT head. FINDINGS: Brain: Motion degraded study. Multiple foci of hypoattenuation within the right frontal white matter corresponding to acute infarctions on  the prior MRI of the brain grossly stable in distribution. Stable distribution of left inferior cerebellar hemisphere infarction. No interval hemorrhage or change in mass effect identified. No new stroke, extra-axial collection, hydrocephalus, or herniation. Stable background of chronic microvascular ischemic changes and volume loss of the brain. Vascular: Calcific atherosclerosis of carotid siphons. No hyperdense vessel identified. Skull: Normal. Negative for fracture or focal lesion. Sinuses/Orbits: Small right maxillary sinus fluid level. Normal aeration of additional visible paranasal sinuses and the mastoid air cells. Bilateral intra-ocular lens replacement. Other: None. IMPRESSION: 1. Motion artifact. 2. Stable distribution of white matter infarcts in the right frontal lobe and infarct within the left inferior cerebellar hemisphere. No interval hemorrhage or change in mass effect identified on this motion degraded study. 3. Stable chronic microvascular ischemic changes and volume loss of the brain.  Electronically Signed   By: Kristine Garbe M.D.   On: 01/01/2018 23:09   Scheduled Meds: . amiodarone  100 mg Oral Daily  . amLODipine  5 mg Oral Daily  . aspirin  325 mg Oral Daily   Or  . aspirin  300 mg Rectal Daily  . atorvastatin  80 mg Oral q1800  . carvedilol  6.25 mg Oral BID WC  . cloNIDine  0.1 mg Transdermal Weekly  . clopidogrel  75 mg Oral Q breakfast  . enoxaparin (LOVENOX) injection  40 mg Subcutaneous Q24H  . feeding supplement (ENSURE ENLIVE)  237 mL Oral BID BM  . folic acid  1 mg Oral Daily  . Influenza vac split quadrivalent PF  0.5 mL Intramuscular Tomorrow-1000  . insulin aspart  0-9 Units Subcutaneous Q4H  . mouth rinse  15 mL Mouth Rinse BID  . multivitamin with minerals  1 tablet Oral Daily  . thiamine  100 mg Oral Daily   Or  . thiamine  100 mg Intravenous Daily   Continuous Infusions: . sodium chloride 10 mL/hr at 12/29/17 2015  . dextrose 5 % with KCl 20 mEq / L 75 mL/hr at 01/03/18 1165    Principal Problem:   Acute CVA (cerebrovascular accident) [facility]) Active Problems:   Goals of care, counseling/discussion   Essential hypertension   PSVT (paroxysmal supraventricular tachycardia) (HCC)   Chest pain   Diabetes mellitus, type II (HCC)   Tobacco abuse   Cocaine abuse (HCC)   Generalized weakness   Hepatic cirrhosis (HCC)   GERD (gastroesophageal reflux disease)   Leukocytosis   Hypomagnesemia   Uncontrolled type 2 diabetes mellitus with hyperglycemia, with long-term current use of insulin (HCC)   CKD (chronic kidney disease), stage III (HCC)   Abnormal EKG   Cerebellar stroke [facility])   Palliative care by specialist  Critical Care Time spent: 33 minutes  Irwin Brakeman, MD [facility] Pager [phone]  If 7PM-7AM, please contact night-coverage [email or web address] Password [email or web address] 01/03/2018, 10:52 AM    LOS: 11 days

## 2018-01-03 NOTE — Evaluation (Signed)
Physical Therapy Evaluation Patient Details Name: Cassandra Jordan MRN: [id number] DOB: [birth year] Today's Date: 01/03/2018   History of Present Illness  Cassandra Jordan is a 67 y.o. female chronic cocaine abuser, smoker, diabetic, with known cerebrovascular and heart disease presented to ED with complaints of a frontal headache that started this morning.  The patient has since resolved but she initially describe pain as a dull headache 9/10 associtated with mild nausea and photophobia.  She also reported dull 8/10 chest pain and pressure described as achy dull pain in upper sternal area.  She was given nitroglycerin in ED and now she says she does not have chest pain.  She says she last used cocaine yesterday evening around 7pm.  She denies shortness of breath.  She has a strong family history of heart disease and premature cardiac death in her father that does in 6s of heart disease.     Clinical Impression  Patient present with significant left side weakness and has difficulty gripping with left hand for attempting functional task, leans to the left when standing due to LLE weakness and limited to a few side steps at bedside due to severe fall risk.  Patient put back to bed after therapy and left on room air with O2 saturations > 95% - RN notified.  Patient will benefit from continued physical therapy in hospital and recommended venue below to increase strength, balance, endurance for safe ADLs and gait.    Follow Up Recommendations SNF    Equipment Recommendations  None recommended by PT    Recommendations for Other Services       Precautions / Restrictions Precautions Precautions: Fall Restrictions Weight Bearing Restrictions: No      Mobility  Bed Mobility Overal bed mobility: Needs Assistance Bed Mobility: Supine to Sit;Sit to Supine     Supine to sit: Mod assist Sit to supine: Mod assist   General bed mobility comments: slow labored movement with limited use of  LUE  Transfers   Equipment used: Rolling walker (2 wheeled) Transfers: Sit to/from [facility] Sit to Stand: Mod assist Stand pivot transfers: Mod assist;Max assist       General transfer comment: very unsteady on feet with leaning to the left due to left sided weakness  Ambulation/Gait Ambulation/Gait assistance: Max assist Gait Distance (Feet): 4 Feet Assistive device: Rolling walker (2 wheeled) Gait Pattern/deviations: Decreased step length - right;Decreased step length - left;Decreased stance time - left;Shuffle;Staggering left Gait velocity: slow   General Gait Details: limited to 6-7 unsteady labored side steps with much difficulty advancing LLE due to weakness  Stairs            Wheelchair Mobility    Modified Rankin (Stroke Patients Only)       Balance Overall balance assessment: Needs assistance Sitting-balance support: Feet supported;No upper extremity supported Sitting balance-Leahy Scale: Fair     Standing balance support: Bilateral upper extremity supported;During functional activity Standing balance-Leahy Scale: Poor Standing balance comment: using RLE with leaning to the left                             Pertinent Vitals/Pain Pain Assessment: No/denies pain    Home Living Family/patient expects to be discharged to:: Private residence Living Arrangements: Alone Available Help at Discharge: Family;Friend(s);Neighbor;Available PRN/intermittently Type of Home: Apartment Home Access: Level entry     Home Layout: One level Home Equipment: Cane - single point;Walker - 2 wheels;Shower seat;Bedside  commode      Prior Function Level of Independence: Independent with assistive device(s)         Comments: community ambulator with SPC, drives     Hand Dominance   Dominant Hand: Right    Extremity/Trunk Assessment   Upper Extremity Assessment Upper Extremity Assessment: Generalized weakness;RUE  deficits/detail;LUE deficits/detail RUE Deficits / Details: grossly 4+/5 LUE Deficits / Details: grossly -3/5 except grip strength 2/5    Lower Extremity Assessment Lower Extremity Assessment: Generalized weakness;RLE deficits/detail;LLE deficits/detail RLE Deficits / Details: grossly 4/5 LLE Deficits / Details: grossly -3/5    Cervical / Trunk Assessment Cervical / Trunk Assessment: Normal  Communication   Communication: No difficulties  Cognition Arousal/Alertness: Awake/alert;Lethargic Behavior During Therapy: Flat affect Overall Cognitive Status: Within Functional Limits for tasks assessed                                 General Comments: requires occasional repeated verbal/tactile cueing      General Comments      Exercises     Assessment/Plan    PT Assessment Patient needs continued PT services  PT Problem List Decreased strength;Decreased activity tolerance;Decreased balance;Decreased mobility       PT Treatment Interventions Gait training;Stair training;Functional mobility training;Therapeutic exercise    PT Goals (Current goals can be found in the Care Plan section)  Acute Rehab PT Goals Patient Stated Goal: return home PT Goal Formulation: With patient Time For Goal Achievement: 01/17/18 Potential to Achieve Goals: Good    Frequency 7X/week   Barriers to discharge        Co-evaluation               AM-PAC PT "6 Clicks" Mobility  Outcome Measure Help needed turning from your back to your side while in a flat bed without using bedrails?: Total Help needed moving from lying on your back to sitting on the side of a flat bed without using bedrails?: Total Help needed moving to and from a bed to a chair (including a wheelchair)?: Total Help needed standing up from a chair using your arms (e.g., wheelchair or bedside chair)?: A Lot Help needed to walk in hospital room?: Total Help needed climbing 3-5 steps with a railing? : Total 6  Click Score: 7    End of Session Equipment Utilized During Treatment: Gait belt Activity Tolerance: Patient limited by fatigue;Patient limited by lethargy Patient left: in bed;with call bell/phone within reach;with bed alarm set Nurse Communication: Mobility status PT Visit Diagnosis: Unsteadiness on feet (R26.81);Other abnormalities of gait and mobility (R26.89);Muscle weakness (generalized) (M62.81)    Time: 5361-4431 PT Time Calculation (min) (ACUTE ONLY): 27 min   Charges:   PT Evaluation $PT Eval Moderate Complexity: 1 Mod PT Treatments $Therapeutic Activity: 23-37 mins        12:11 PM, 01/03/18 Lonell Grandchild, MPT Physical Therapist with [facility] [phone] office [phone] mobile phone

## 2018-01-03 NOTE — Progress Notes (Addendum)
[place] A. Merlene Laughter, MD     [email or web address]          Cassandra Jordan is an 67 y.o. female.   Assessment/Plan: 1.  Deterioration in neurological function after stroke a week ago: She actually has improved since she was last seen in a couple days ago.  Repeat imaging shows likely normal evolution of multiple infarcts but no hemorrhage.  EEG unremarkable.  Repeat labs are unremarkable.  No apparent findings for the patient's cognition other than multiple known infarcts.  She likely will have some cognitive impairment/mild vascular dementia.  Continue with current treatment.     Patient seems improved today.    GENERAL: She cooperates with evaluation and in no acute distress.  HEENT: Neck is supple without trauma.  ABDOMEN: soft  EXTREMITIES: No edema   BACK: Normal  SKIN: Normal by inspection.    MENTAL STATUS: She is sleeping but easily arousable.  Dysarthria seems much improved.  She follows commands well.  She knows that she is in the hospital in [place].  There is still some confusion at times however.  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  MOTOR: She moves the right side spontaneously and well with strength graded as about 4/5.  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  There is marked drift on the left side.  COORDINATION: No tremors, rigidity, parkinsonism or dysmetria are appreciated.  SENSATION: There is a left-sided neglect.        Objective: Vital signs in last 24 hours: Temp:  [97.1 F (36.2 C)-98.3 F (36.8 C)] 98.3 F (36.8 C) (12/13 1557) Pulse Rate:  [25-95] 79 (12/13 1500) Resp:  [14-30] 22 (12/13 1500) BP: (110-219)/(67-191) 121/69 (12/13 1500) SpO2:   [90 %-100 %] 97 % (12/13 1500) Weight:  [66.4 kg] 66.4 kg (12/13 0600)  Intake/Output from previous day: 12/12 0701 - 12/13 0700 In: 1867.6 [I.V.:1867.6] Out: 800 [Urine:800] Intake/Output this shift: Total I/O In: 444.8 [P.O.:360; I.V.:84.8] Out: 700 [Urine:700] Nutritional status:  Diet Order            DIET DYS 2 Room service appropriate? Yes; Fluid consistency: Thin  Diet effective now               Lab Results: Results for orders placed or performed during the hospital encounter of 12/23/17 (from the past 48 hour(s))  Glucose, capillary     Status: Abnormal   Collection Time: 01/01/18  8:02 PM  Result Value Ref Range   Glucose-Capillary 120 (H) 70 - 99 mg/dL   Comment 1 Notify RN    Comment 2 Document in Chart   RPR     Status: None   Collection Time: 01/01/18  8:21 PM  Result Value Ref Range   RPR Ser Ql Non Reactive Non Reactive    Comment: (NOTE) Performed At: [facility] [place], [place] [id number] Rush Farmer MD [id number]   Homocysteine     Status: Abnormal   Collection Time: 01/01/18  8:21 PM  Result Value Ref Range   Homocysteine 17.3 (H) 0.0 - 15.0 umol/L    Comment: (NOTE) Performed At: [facility] [place], [place] [id number] Rush Farmer MD [id number]   Vitamin B12     Status:  None   Collection Time: 01/01/18  8:21 PM  Result Value Ref Range   Vitamin B-12 615 180 - 914 pg/mL    Comment: (NOTE) This assay is not validated for testing neonatal or myeloproliferative syndrome specimens for Vitamin B12 levels. Performed at [facility], [address], [place], [place] [address]   TSH     Status: None   Collection Time: 01/01/18  8:21 PM  Result Value Ref Range   TSH 1.279 0.350 - 4.500 uIU/mL    Comment: Performed by a 3rd Generation assay with a functional sensitivity of <=0.01 uIU/mL. Performed at [facility], [address] [place], [place] [address]   Glucose, capillary      Status: Abnormal   Collection Time: 01/01/18 11:40 PM  Result Value Ref Range   Glucose-Capillary 105 (H) 70 - 99 mg/dL   Comment 1 Notify RN    Comment 2 Document in Chart   Glucose, capillary     Status: Abnormal   Collection Time: 01/02/18  4:15 AM  Result Value Ref Range   Glucose-Capillary 117 (H) 70 - 99 mg/dL  Glucose, capillary     Status: Abnormal   Collection Time: 01/02/18  7:40 AM  Result Value Ref Range   Glucose-Capillary 121 (H) 70 - 99 mg/dL  Glucose, capillary     Status: Abnormal   Collection Time: 01/02/18 11:30 AM  Result Value Ref Range   Glucose-Capillary 107 (H) 70 - 99 mg/dL  Glucose, capillary     Status: Abnormal   Collection Time: 01/02/18  4:12 PM  Result Value Ref Range   Glucose-Capillary 122 (H) 70 - 99 mg/dL  Glucose, capillary     Status: Abnormal   Collection Time: 01/02/18  8:23 PM  Result Value Ref Range   Glucose-Capillary 113 (H) 70 - 99 mg/dL   Comment 1 Notify RN    Comment 2 Document in Chart   Glucose, capillary     Status: Abnormal   Collection Time: 01/03/18 12:06 AM  Result Value Ref Range   Glucose-Capillary 103 (H) 70 - 99 mg/dL   Comment 1 Notify RN    Comment 2 Document in Chart   Glucose, capillary     Status: Abnormal   Collection Time: 01/03/18  4:01 AM  Result Value Ref Range   Glucose-Capillary 117 (H) 70 - 99 mg/dL   Comment 1 Notify RN    Comment 2 Document in Chart   Basic metabolic panel     Status: Abnormal   Collection Time: 01/03/18  4:05 AM  Result Value Ref Range   Sodium 138 135 - 145 mmol/L   Potassium 3.5 3.5 - 5.1 mmol/L   Chloride 107 98 - 111 mmol/L   CO2 24 22 - 32 mmol/L   Glucose, Bld 125 (H) 70 - 99 mg/dL   BUN 12 8 - 23 mg/dL   Creatinine, Ser 1.39 (H) 0.44 - 1.00 mg/dL   Calcium 8.6 (L) 8.9 - 10.3 mg/dL   GFR calc non Af Amer 39 (L) >60 mL/min   GFR calc Af Amer 46 (L) >60 mL/min   Anion gap 7 5 - 15    Comment: Performed at [facility], [address] [place],  [address]    Magnesium     Status: None   Collection Time: 01/03/18  4:05 AM  Result Value Ref Range   Magnesium 1.7 1.7 - 2.4 mg/dL    Comment: Performed at [facility], [address], [place],  [place] [address]  CBC     Status: Abnormal   Collection Time: 01/03/18  4:05 AM  Result Value Ref Range   WBC 12.6 (H) 4.0 - 10.5 K/uL   RBC 3.99 3.87 - 5.11 MIL/uL   Hemoglobin 10.8 (L) 12.0 - 15.0 g/dL   HCT 35.3 (L) 36.0 - 46.0 %   MCV 88.5 80.0 - 100.0 fL   MCH 27.1 26.0 - 34.0 pg   MCHC 30.6 30.0 - 36.0 g/dL   RDW 13.6 11.5 - 15.5 %   Platelets 349 150 - 400 K/uL   nRBC 0.0 0.0 - 0.2 %    Comment: Performed at [facility], [address], [place], [place] [address]  Glucose, capillary     Status: Abnormal   Collection Time: 01/03/18  7:22 AM  Result Value Ref Range   Glucose-Capillary 112 (H) 70 - 99 mg/dL  Glucose, capillary     Status: Abnormal   Collection Time: 01/03/18 11:22 AM  Result Value Ref Range   Glucose-Capillary 162 (H) 70 - 99 mg/dL  Glucose, capillary     Status: Abnormal   Collection Time: 01/03/18  3:55 PM  Result Value Ref Range   Glucose-Capillary 101 (H) 70 - 99 mg/dL    Lipid Panel No results for input(s): CHOL, TRIG, HDL, CHOLHDL, VLDL, LDLCALC in the last 72 hours.  Studies/Results:   Medications:  Scheduled Meds: . amiodarone  100 mg Oral Daily  . amLODipine  5 mg Oral Daily  . aspirin  325 mg Oral Daily   Or  . aspirin  300 mg Rectal Daily  . atorvastatin  80 mg Oral q1800  . carvedilol  6.25 mg Oral BID WC  . cloNIDine  0.1 mg Transdermal Weekly  . clopidogrel  75 mg Oral Q breakfast  . enoxaparin (LOVENOX) injection  40 mg Subcutaneous Q24H  . feeding supplement (ENSURE ENLIVE)  237 mL Oral BID BM  . folic acid  1 mg Oral Daily  . Influenza vac split quadrivalent PF  0.5 mL Intramuscular Tomorrow-1000  . insulin aspart  0-9 Units Subcutaneous TID WC  . mouth rinse  15 mL Mouth Rinse BID  . multivitamin with minerals  1 tablet Oral Daily  .  thiamine  100 mg Oral Daily   Or  . thiamine  100 mg Intravenous Daily   Continuous Infusions: . sodium chloride 10 mL/hr at 12/29/17 2015  . dextrose 5 % with KCl 20 mEq / L 50 mL/hr at 01/03/18 1208   PRN Meds:.sodium chloride, acetaminophen **OR** acetaminophen (TYLENOL) oral liquid 160 mg/5 mL **OR** acetaminophen, albuterol, hydrALAZINE, LORazepam, nitroGLYCERIN, senna-docusate      BRAIN MRI MRA IMPRESSION: Acute infarction within the left posterior inferior cerebellar artery territory. Mild swelling but no hemorrhage or mass effect. Flow persists within the left vertebral artery and at least to some extent within the left posterior inferior cerebellar artery.  Multiple acute infarctions within the deep white matter/radiating white matter tracts of the right cerebral hemisphere, most consistent with watershed or hypoperfusion infarctions. Mild swelling but no hemorrhage or mass effect. 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  The intracranial MR angiogram is motion degraded compared to previous studies. Both anterior cerebral arteries receive there supply from the left carotid circulation. No change seen on the left in the anterior circulation. The right supraclinoid ICA/proximal MCA stenosis is new.  Head CT scan is reviewed in [place] and shows hypodensity involving the centrum  semiovale on the right and left cerebellum commensurate with evolution of known infarct from a week ago.  No hemorrhage.     LOS: 11 days   Brighton Pilley A. Merlene Laughter, M.D.  Diplomate, Tax adviser of Psychiatry and Neurology ( Neurology).

## 2018-01-03 NOTE — Clinical Social Work Note (Signed)
Clinical Social Work Assessment  Patient Details  Name: Cassandra Jordan MRN: [id number] Date of Birth: Nov 14, 1950  Date of referral:  01/03/18               Reason for consult:  Facility Placement                Permission sought to share information with:  Chartered certified accountant granted to share information::  Yes, Verbal Permission Granted  Name::        Agency::  all in [facility]  Relationship::     Contact Information:     Housing/Transportation Living arrangements for the past 2 months:  [place] of Information:  Adult Children Patient Interpreter Needed:  None Criminal Activity/Legal Involvement Pertinent to Current Situation/Hospitalization:  No - Comment as needed Significant Relationships:  Adult Children Lives with:  Self Do you feel safe going back to the place where you live?  No Need for family participation in patient care:  Yes (Comment)  Care giving concerns: PT recommending SNF rehab.   Social Worker assessment / plan: Pt is a 67 year old female referred to CSW for SNF rehab placement. Met with pt along with PT this AM. PT now recommending SNF rehab. Pt unable to verbalize where she would like to go. Contacted pt's daughter by phone. She requests referrals to all [facility] facilities. Will work on [employer] and referrals. Pt will need insurance authorization before she can dc if a SNF bed is found. Will follow.  Employment status:  Retired Nurse, adult PT Recommendations:  [place] / Referral to community resources:  [place]  Patient/Family's Response to care: Pt and family accepting of care.  Patient/Family's Understanding of and Emotional Response to Diagnosis, Current Treatment, and Prognosis: Pt unable to understand diagnosis and treatment recommendations at this time. Dtr understands. No emotional distress identified.  Emotional  Assessment Appearance:  Appears stated age Attitude/Demeanor/Rapport:  Unable to Assess Affect (typically observed):  Flat Orientation:  Oriented to Self Alcohol / Substance use:  Illicit Drugs Psych involvement (Current and /or in the community):  No (Comment)  Discharge Needs  Concerns to be addressed:  Discharge Planning Concerns Readmission within the last 30 days:  No Current discharge risk:  Dependent with Mobility, Lives alone, Cognitively Impaired, Substance Abuse, Physical Impairment Barriers to Discharge:  [place], LCSW 01/03/2018, 12:58 PM

## 2018-01-03 NOTE — Clinical Social Work Placement (Signed)
   CLINICAL SOCIAL WORK PLACEMENT  NOTE  Date:  01/03/2018  Patient Details  Name: Cassandra Jordan MRN: [id number] Date of Birth: [birth year]  Clinical Social Work is seeking post-discharge placement for this patient at the [place] level of care (*CSW will initial, date and re-position this form in  chart as items are completed):  Yes   Patient/family provided with [place] Work Department's list of facilities offering this level of care within the geographic area requested by the patient (or if unable, by the patient's family).  Yes   Patient/family informed of their freedom to choose among providers that offer the needed level of care, that participate in Medicare, Medicaid or managed care program needed by the patient, have an available bed and are willing to accept the patient.  Yes   Patient/family informed of [facility]'s ownership interest in [facility] and [facility], as well as of the fact that they are under no obligation to receive care at these facilities.  PASRR submitted to EDS on       PASRR number received on       Existing PASRR number confirmed on       FL2 transmitted to all facilities in geographic area requested by pt/family on 01/03/18     FL2 transmitted to all facilities within larger geographic area on       Patient informed that his/her managed care company has contracts with or will negotiate with certain facilities, including the following:            Patient/family informed of bed offers received.  Patient chooses bed at       Physician recommends and patient chooses bed at      Patient to be transferred to   on  .  Patient to be transferred to facility by       Patient family notified on   of transfer.  Name of family member notified:        PHYSICIAN       Additional Comment:  Per family request, bed request sent to all facilities in [facility].  No PASSR # yet as they requested updated  information, which was sent today.   _______________________________________________ Trish Mage, LCSW 01/03/2018, 3:58 PM

## 2018-01-03 NOTE — Progress Notes (Signed)
01/03/2018 2:16 PM  I had a family meeting with patient's sister Izora Gala and her daughter at bedside.  They requested CIR and if that is not an option then they prefer SNF placement with their first choice being the [facility].  I have asked for a consultation with the clinical social worker to begin making arrangements for rehab placement.    Murvin Natal MD

## 2018-01-03 NOTE — Progress Notes (Signed)
SLP Cancellation Note  Patient Details Name: NIKKOLE PLACZEK MRN: [id number] DOB: Aug 16, 1950   Cancelled treatment:       Reason Eval/Treat Not Completed: Patient's level of consciousness. Pt was "finally resting" after a very restless night Per RN. Pt was not at a level of alertness that was appropriate for PO trials. SLP will re-attempt as schedule permits later today. Thank you,  Amelia H. Roddie Mc, CCC-SLP Speech Language Pathologist    Wende Bushy 01/03/2018, 7:55 AM

## 2018-01-03 NOTE — Progress Notes (Signed)
Constantly trying to get up, speaking to and yelling out for various names. Will not accommodate any medical devices, all of which have been replaced multiple times.  BP unreliable due to agitation toward BP cuff and any attempt to obtain BP otherwise.

## 2018-01-03 NOTE — Plan of Care (Signed)
  Problem: Education: Goal: Knowledge of General Education information will improve Description Including pain rating scale, medication(s)/side effects and non-pharmacologic comfort measures Outcome: Progressing   Problem: Health Behavior/Discharge Planning: Goal: Ability to manage health-related needs will improve Outcome: Progressing   Problem: Clinical Measurements: Goal: Ability to maintain clinical measurements within normal limits will improve Outcome: Progressing Goal: Will remain free from infection Outcome: Progressing Goal: Diagnostic test results will improve Outcome: Progressing Goal: Respiratory complications will improve Outcome: Progressing Goal: Cardiovascular complication will be avoided Outcome: Progressing   Problem: Nutrition: Goal: Adequate nutrition will be maintained Outcome: Progressing   Problem: Coping: Goal: Level of anxiety will decrease Outcome: Progressing   Problem: Elimination: Goal: Will not experience complications related to bowel motility Outcome: Progressing Goal: Will not experience complications related to urinary retention Outcome: Progressing   Problem: Pain Managment: Goal: General experience of comfort will improve Outcome: Progressing   Problem: Safety: Goal: Ability to remain free from injury will improve Outcome: Progressing   Problem: Skin Integrity: Goal: Risk for impaired skin integrity will decrease Outcome: Progressing   Problem: Education: Goal: Knowledge of secondary prevention will improve Outcome: Progressing Goal: Knowledge of patient specific risk factors addressed and post discharge goals established will improve Outcome: Progressing Goal: Individualized Educational Video(s) Outcome: Progressing   Problem: Education: Goal: Knowledge of disease or condition will improve Outcome: Progressing Goal: Knowledge of secondary prevention will improve Outcome: Progressing Goal: Knowledge of patient specific  risk factors addressed and post discharge goals established will improve Outcome: Progressing Goal: Individualized Educational Video(s) Outcome: Progressing   Problem: Coping: Goal: Will verbalize positive feelings about self Outcome: Progressing Goal: Will identify appropriate support needs Outcome: Progressing   Problem: Health Behavior/Discharge Planning: Goal: Ability to manage health-related needs will improve Outcome: Progressing   Problem: Self-Care: Goal: Ability to participate in self-care as condition permits will improve Outcome: Progressing Goal: Verbalization of feelings and concerns over difficulty with self-care will improve Outcome: Progressing Goal: Ability to communicate needs accurately will improve Outcome: Progressing   Problem: Nutrition: Goal: Risk of aspiration will decrease Outcome: Progressing Goal: Dietary intake will improve Outcome: Progressing

## 2018-01-04 LAB — CBC WITH DIFFERENTIAL/PLATELET
Abs Immature Granulocytes: 0.1 10*3/uL — ABNORMAL HIGH (ref 0.00–0.07)
Basophils Absolute: 0.1 10*3/uL (ref 0.0–0.1)
Basophils Relative: 1 %
EOS PCT: 3 %
Eosinophils Absolute: 0.4 10*3/uL (ref 0.0–0.5)
HCT: 34.4 % — ABNORMAL LOW (ref 36.0–46.0)
HEMOGLOBIN: 10.7 g/dL — AB (ref 12.0–15.0)
Immature Granulocytes: 1 %
LYMPHS PCT: 45 %
Lymphs Abs: 5.8 10*3/uL — ABNORMAL HIGH (ref 0.7–4.0)
MCH: 28 pg (ref 26.0–34.0)
MCHC: 31.1 g/dL (ref 30.0–36.0)
MCV: 90.1 fL (ref 80.0–100.0)
MONO ABS: 1.4 10*3/uL — AB (ref 0.1–1.0)
Monocytes Relative: 11 %
NEUTROS ABS: 5.1 10*3/uL (ref 1.7–7.7)
Neutrophils Relative %: 39 %
Platelets: 325 10*3/uL (ref 150–400)
RBC: 3.82 MIL/uL — ABNORMAL LOW (ref 3.87–5.11)
RDW: 13.7 % (ref 11.5–15.5)
WBC: 12.8 10*3/uL — ABNORMAL HIGH (ref 4.0–10.5)
nRBC: 0.2 % (ref 0.0–0.2)

## 2018-01-04 LAB — RENAL FUNCTION PANEL
Albumin: 2.5 g/dL — ABNORMAL LOW (ref 3.5–5.0)
Anion gap: 7 (ref 5–15)
BUN: 14 mg/dL (ref 8–23)
CALCIUM: 8.4 mg/dL — AB (ref 8.9–10.3)
CO2: 24 mmol/L (ref 22–32)
Chloride: 107 mmol/L (ref 98–111)
Creatinine, Ser: 1.51 mg/dL — ABNORMAL HIGH (ref 0.44–1.00)
GFR calc Af Amer: 41 mL/min — ABNORMAL LOW (ref >60)
GFR calc non Af Amer: 36 mL/min — ABNORMAL LOW (ref >60)
Glucose, Bld: 111 mg/dL — ABNORMAL HIGH (ref 70–99)
Phosphorus: 3.2 mg/dL (ref 2.5–4.6)
Potassium: 4 mmol/L (ref 3.5–5.1)
Sodium: 138 mmol/L (ref 135–145)

## 2018-01-04 LAB — GLUCOSE, CAPILLARY
Glucose-Capillary: 103 mg/dL — ABNORMAL HIGH (ref 70–99)
Glucose-Capillary: 108 mg/dL — ABNORMAL HIGH (ref 70–99)
Glucose-Capillary: 108 mg/dL — ABNORMAL HIGH (ref 70–99)
Glucose-Capillary: 122 mg/dL — ABNORMAL HIGH (ref 70–99)

## 2018-01-04 MED ORDER — NICOTINE 21 MG/24HR TD PT24
21.0000 mg | MEDICATED_PATCH | Freq: Every day | TRANSDERMAL | Status: DC
  Administered 2018-01-04 – 2018-01-10 (×7): 21 mg via TRANSDERMAL
  Filled 2018-01-04 (×7): qty 1

## 2018-01-04 NOTE — Progress Notes (Signed)
PROGRESS NOTE  Cassandra Jordan  ZLD:[id number]  DOB: [birth year]  DOA: 12/23/2017 PCP: [facility]  Brief Admission Hx: 67 y.o. female chronic cocaine abuser, smoker, diabetic, with known cerebrovascular and heart disease presented to ED with complaints of a frontal headache and acute CVA.    MDM/Assessment & Plan:   1. Acute CVA - MRI reveals multiple acute infarcts in deep white matter and along the left PICA territory.  Pt is on CVA treatment protocol.  Neuro recommends dual antiplatelet therapy.  Avoid abrupt and rapid drop in blood pressure. Pt is eating now and able to take medications.   2. 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  We will plan on discussing with vascular surgery when she has stabilized.  Neurology has recommended 30 day event monitor and this is been arranged through cardiology.   3. Dyslipidemia - LDL of 181 - continue atorvastatin 80 mg daily when able to take medications by mouth. 4. Prolonged QTc - monitoring on telemetry.  Monitor lytes.  Repeat EKG 12/13 shows persistent QTc prolongation.   Trying to avoid QT prolonging agents.  5. Aspiration pneumonia -she is completed a course of intravenous Unasyn.  SLP evaluation requested as her mental status is improving enough for an evaluation.   6. Acute respiratory distress -resolved now.  Pt briefly required CPAP/BiPAP.  Pulmonology followed.  She is now off nasal cannula and has oxygen saturations in the 90s. 7. COPD - Stable.  No wheezing at this time.  Continue bronchodilators as needed 8. Acute alcohol / recreational drug withdrawal-resolved. Ativan and morphine have been discontinued. 9. Essential hypertension -blood pressures remain elevated.  Resuming oral BP meds.   10. PSVT - cardiology has restarted patient on her home amiodarone and carvedilol. 11. Type 2 DM - CBG Q4h while NPO plus  SSI coverage. A1c is 6.7% which is at ADA goal.  12. Cocaine abuse - Social worker consulted to discuss treatment options with patient.   13. Acute encephalopathy - Improving slowly.  This all seems to be related to her strokes.  She was seen again by neurology who agreed.  Repeat CT did not show any further findings to suggest worsening of cerebrovascular disease.  EEG is normal.   This is likely her new baseline per neurology.  I have asked for a palliative care consult to start goals of care discussions with family.  Lorazepam ordered as needed.   Unable to give antipsychotics due to persistently prolonged QT interval.  14. Hypernatremia - Sodium has improved with hypotonic fluids. 15. Tobacco abuse - nicotine patch ordered.   DVT prophylaxis: lovenox Code Status: Full  Family Communication: Family meeting with sister and niece 12/14.  Family wants to pursue SNF rehab or CIR.    Disposition Plan: SNF recommended  Consultants:  Neurology  Cardiology  Pulmonology  Antimicrobials:  Unasyn 12/5>12/10  Subjective: Pt better today after her family visit yesterday.  She is much less agitated. She has better short term memory recall.      Objective: Vitals:   01/04/18 0700 01/04/18 0737 01/04/18 0800 01/04/18 0900  BP: (!) 158/83  (!) 166/110 (!) 175/87  Pulse: 74  78   Resp: (!) 24  18 (!) 28  Temp:  97.7 F (36.5 C)    TempSrc:  Oral    SpO2: 92%  97%   Weight:      Height:  Intake/Output Summary (Last 24 hours) at 01/04/2018 0958 Last data filed at 01/04/2018 0900 Gross per 24 hour  Intake 1333.31 ml  Output -  Net 1333.31 ml   Filed Weights   01/02/18 0426 01/02/18 1323 01/03/18 0600  Weight: 68.8 kg 67.8 kg 66.4 kg   Exam:  General exam: remains confused at times but could be re-oriented and answer questions appropriately. Respiratory system: Clear to auscultation. Respiratory effort normal. Cardiovascular system: normal s1,s2 sounds.  No murmurs, rubs,  gallops. Gastrointestinal system: Abdomen is nondistended, soft and nontender. No organomegaly or masses felt. Normal bowel sounds heard. Central nervous system:.  Moving all extremities spontaneously.  persistent left-sided facial droop. Normal speech.  Extremities: No C/C/E, +pedal pulses Skin: No rashes, lesions or ulcers Psychiatry: disoriented.   Data Reviewed: Basic Metabolic Panel: Recent Labs  Lab 12/29/17 0442 12/30/17 0643 12/31/17 0418 01/01/18 0403 01/03/18 0405 01/04/18 0448  NA 145 148* 149* 141 138 138  K 3.9 3.5 3.6 3.3* 3.5 4.0  CL 113* 114* 115* 109 107 107  CO2 23 23 24 25 24 24   GLUCOSE 105* 97 114* 173* 125* 111*  BUN 26* 24* 24* 18 12 14   CREATININE 1.28* 1.33* 1.45* 1.36* 1.39* 1.51*  CALCIUM 8.6* 8.9 8.5* 8.3* 8.6* 8.4*  MG 2.0 2.0  --   --  1.7  --   PHOS  --   --   --   --   --  3.2   Liver Function Tests: Recent Labs  Lab 12/29/17 0442 12/30/17 0643 12/31/17 0418 01/04/18 0448  AST 51* 45* 42*  --   ALT 26 25 24   --   ALKPHOS 40 41 39  --   BILITOT 0.8 1.0 0.8  --   PROT 7.3 7.7 7.4  --   ALBUMIN 2.7* 2.8* 2.7* 2.5*   No results for input(s): LIPASE, AMYLASE in the last 168 hours. No results for input(s): AMMONIA in the last 168 hours. CBC: Recent Labs  Lab 12/29/17 0442 12/30/17 0643 12/31/17 0418 01/03/18 0405 01/04/18 0448  WBC 10.7* 10.9* 10.2 12.6* 12.8*  NEUTROABS 7.2 6.6 5.8  --  5.1  HGB 10.1* 10.4* 10.0* 10.8* 10.7*  HCT 33.2* 34.1* 33.2* 35.3* 34.4*  MCV 90.5 90.7 91.0 88.5 90.1  PLT 350 371 356 349 325   Cardiac Enzymes: No results for input(s): CKTOTAL, CKMB, CKMBINDEX, TROPONINI in the last 168 hours. CBG (last 3)  Recent Labs    01/03/18 1555 01/03/18 2020 01/04/18 0735  GLUCAP 101* 119* 103*   No results found for this or any previous visit (from the past 240 hour(s)).   Studies: No results found. Scheduled Meds: . amiodarone  100 mg Oral Daily  . amLODipine  5 mg Oral Daily  . aspirin  325 mg Oral  Daily   Or  . aspirin  300 mg Rectal Daily  . atorvastatin  80 mg Oral q1800  . carvedilol  6.25 mg Oral BID WC  . cloNIDine  0.1 mg Transdermal Weekly  . clopidogrel  75 mg Oral Q breakfast  . enoxaparin (LOVENOX) injection  40 mg Subcutaneous Q24H  . feeding supplement (ENSURE ENLIVE)  237 mL Oral BID BM  . folic acid  1 mg Oral Daily  . Influenza vac split quadrivalent PF  0.5 mL Intramuscular Tomorrow-1000  . insulin aspart  0-9 Units Subcutaneous TID WC  . mouth rinse  15 mL Mouth Rinse BID  . multivitamin with minerals  1 tablet Oral Daily  .  nicotine  21 mg Transdermal Daily  . thiamine  100 mg Oral Daily   Or  . thiamine  100 mg Intravenous Daily   Continuous Infusions: . sodium chloride 10 mL/hr at 01/04/18 0900  . dextrose 5 % with KCl 20 mEq / L 50 mL/hr at 01/04/18 0900    Principal Problem:   Acute CVA (cerebrovascular accident) [facility]) Active Problems:   Goals of care, counseling/discussion   Essential hypertension   PSVT (paroxysmal supraventricular tachycardia) (HCC)   Chest pain   Diabetes mellitus, type II (HCC)   Tobacco abuse   Cocaine abuse (HCC)   Generalized weakness   Hepatic cirrhosis (HCC)   GERD (gastroesophageal reflux disease)   Leukocytosis   Hypomagnesemia   Uncontrolled type 2 diabetes mellitus with hyperglycemia, with long-term current use of insulin (HCC)   CKD (chronic kidney disease), stage III (HCC)   Abnormal EKG   Cerebellar stroke [facility])   Palliative care by specialist  Critical Care Time spent: 31 minutes  Irwin Brakeman, MD [facility] Pager [phone]  If 7PM-7AM, please contact night-coverage [email or web address] Password [email or web address] 01/04/2018, 9:58 AM    LOS: 12 days

## 2018-01-04 NOTE — Plan of Care (Signed)
Problem: Education: Goal: Knowledge of General Education information will improve Description Including pain rating scale, medication(s)/side effects and non-pharmacologic comfort measures 01/04/2018 0923 by Jeanne Ivan, RN Outcome: Progressing 01/04/2018 0921 by Jeanne Ivan, RN Outcome: Progressing   Problem: Health Behavior/Discharge Planning: Goal: Ability to manage health-related needs will improve 01/04/2018 0923 by Jeanne Ivan, RN Outcome: Progressing 01/04/2018 0921 by Jeanne Ivan, RN Outcome: Progressing   Problem: Clinical Measurements: Goal: Ability to maintain clinical measurements within normal limits will improve 01/04/2018 0923 by Jeanne Ivan, RN Outcome: Progressing 01/04/2018 0921 by Jeanne Ivan, RN Outcome: Progressing Goal: Will remain free from infection 01/04/2018 0923 by Jeanne Ivan, RN Outcome: Progressing 01/04/2018 0921 by Jeanne Ivan, RN Outcome: Progressing Goal: Diagnostic test results will improve 01/04/2018 0923 by Jeanne Ivan, RN Outcome: Progressing 01/04/2018 0921 by Jeanne Ivan, RN Outcome: Progressing Goal: Respiratory complications will improve 01/04/2018 0923 by Jeanne Ivan, RN Outcome: Progressing 01/04/2018 0921 by Jeanne Ivan, RN Outcome: Progressing Goal: Cardiovascular complication will be avoided 01/04/2018 0923 by Jeanne Ivan, RN Outcome: Progressing 01/04/2018 0921 by Jeanne Ivan, RN Outcome: Progressing   Problem: Activity: Goal: Risk for activity intolerance will decrease 01/04/2018 0923 by Jeanne Ivan, RN Outcome: Progressing 01/04/2018 0921 by Jeanne Ivan, RN Outcome: Progressing   Problem: Nutrition: Goal: Adequate nutrition will be maintained 01/04/2018 0923 by Jeanne Ivan, RN Outcome: Progressing 01/04/2018 0921 by Jeanne Ivan, RN Outcome: Progressing   Problem: Coping: Goal: Level of anxiety will decrease 01/04/2018 0923 by Jeanne Ivan, RN Outcome: Progressing 01/04/2018 0921 by Jeanne Ivan, RN Outcome: Progressing   Problem: Elimination: Goal: Will not experience complications related to bowel motility 01/04/2018 0923 by Jeanne Ivan, RN Outcome: Progressing 01/04/2018 0921 by Jeanne Ivan, RN Outcome: Progressing Goal: Will not experience complications related to urinary retention 01/04/2018 0923 by Jeanne Ivan, RN Outcome: Progressing 01/04/2018 0921 by Jeanne Ivan, RN Outcome: Progressing   Problem: Pain Managment: Goal: General experience of comfort will improve 01/04/2018 0923 by Jeanne Ivan, RN Outcome: Progressing 01/04/2018 0921 by Jeanne Ivan, RN Outcome: Progressing   Problem: Safety: Goal: Ability to remain free from injury will improve 01/04/2018 0923 by Jeanne Ivan, RN Outcome: Progressing 01/04/2018 0921 by Jeanne Ivan, RN Outcome: Progressing   Problem: Skin Integrity: Goal: Risk for impaired skin integrity will decrease 01/04/2018 0923 by Jeanne Ivan, RN Outcome: Progressing 01/04/2018 0921 by Jeanne Ivan, RN Outcome: Progressing   Problem: Education: Goal: Knowledge of secondary prevention will improve 01/04/2018 0923 by Jeanne Ivan, RN Outcome: Progressing 01/04/2018 0921 by Jeanne Ivan, RN Outcome: Progressing Goal: Knowledge of patient specific risk factors addressed and post discharge goals established will improve 01/04/2018 0923 by Jeanne Ivan, RN Outcome: Progressing 01/04/2018 0921 by Jeanne Ivan, RN Outcome: Progressing Goal: Individualized Educational Video(s) 01/04/2018 0923 by Jeanne Ivan, RN Outcome: Progressing 01/04/2018 0921 by Jeanne Ivan, RN Outcome: Progressing   Problem: Education: Goal: Knowledge of disease or condition will improve 01/04/2018 0923 by Jeanne Ivan, RN Outcome: Progressing 01/04/2018 0921 by Jeanne Ivan, RN Outcome: Progressing Goal: Knowledge of  secondary prevention will improve 01/04/2018 0923 by Jeanne Ivan, RN Outcome: Progressing 01/04/2018 0921 by Jeanne Ivan, RN Outcome: Progressing Goal: Knowledge of patient specific risk factors addressed and post discharge goals established will improve 01/04/2018 0923 by Jeanne Ivan, RN Outcome: Progressing  01/04/2018 0921 by Jeanne Ivan, RN Outcome: Progressing Goal: Individualized Educational Video(s) 01/04/2018 0923 by Jeanne Ivan, RN Outcome: Progressing 01/04/2018 0921 by Jeanne Ivan, RN Outcome: Progressing   Problem: Education: Goal: Knowledge of disease or condition will improve 01/04/2018 0923 by Jeanne Ivan, RN Outcome: Progressing 01/04/2018 0921 by Jeanne Ivan, RN Outcome: Progressing Goal: Knowledge of secondary prevention will improve 01/04/2018 0923 by Jeanne Ivan, RN Outcome: Progressing 01/04/2018 0921 by Jeanne Ivan, RN Outcome: Progressing Goal: Knowledge of patient specific risk factors addressed and post discharge goals established will improve 01/04/2018 0923 by Jeanne Ivan, RN Outcome: Progressing 01/04/2018 0921 by Jeanne Ivan, RN Outcome: Progressing Goal: Individualized Educational Video(s) 01/04/2018 0923 by Jeanne Ivan, RN Outcome: Progressing 01/04/2018 0921 by Jeanne Ivan, RN Outcome: Progressing   Problem: Coping: Goal: Will verbalize positive feelings about self 01/04/2018 0923 by Jeanne Ivan, RN Outcome: Progressing 01/04/2018 0921 by Jeanne Ivan, RN Outcome: Progressing Goal: Will identify appropriate support needs 01/04/2018 0923 by Jeanne Ivan, RN Outcome: Progressing 01/04/2018 0921 by Jeanne Ivan, RN Outcome: Progressing   Problem: Health Behavior/Discharge Planning: Goal: Ability to manage health-related needs will improve 01/04/2018 0923 by Jeanne Ivan, RN Outcome: Progressing 01/04/2018 0921 by Jeanne Ivan, RN Outcome: Progressing    Problem: Self-Care: Goal: Ability to participate in self-care as condition permits will improve 01/04/2018 0923 by Jeanne Ivan, RN Outcome: Progressing 01/04/2018 0921 by Jeanne Ivan, RN Outcome: Progressing Goal: Verbalization of feelings and concerns over difficulty with self-care will improve 01/04/2018 0923 by Jeanne Ivan, RN Outcome: Progressing 01/04/2018 0921 by Jeanne Ivan, RN Outcome: Progressing Goal: Ability to communicate needs accurately will improve 01/04/2018 0923 by Jeanne Ivan, RN Outcome: Progressing 01/04/2018 0921 by Jeanne Ivan, RN Outcome: Progressing   Problem: Nutrition: Goal: Risk of aspiration will decrease 01/04/2018 0923 by Jeanne Ivan, RN Outcome: Progressing 01/04/2018 0921 by Jeanne Ivan, RN Outcome: Progressing Goal: Dietary intake will improve 01/04/2018 0923 by Jeanne Ivan, RN Outcome: Progressing 01/04/2018 0921 by Jeanne Ivan, RN Outcome: Progressing

## 2018-01-04 NOTE — Plan of Care (Signed)
  Problem: Education: Goal: Knowledge of General Education information will improve Description Including pain rating scale, medication(s)/side effects and non-pharmacologic comfort measures Outcome: Progressing   Problem: Health Behavior/Discharge Planning: Goal: Ability to manage health-related needs will improve Outcome: Progressing   Problem: Clinical Measurements: Goal: Ability to maintain clinical measurements within normal limits will improve Outcome: Progressing Goal: Will remain free from infection Outcome: Progressing Goal: Diagnostic test results will improve Outcome: Progressing Goal: Respiratory complications will improve Outcome: Progressing Goal: Cardiovascular complication will be avoided Outcome: Progressing   Problem: Nutrition: Goal: Adequate nutrition will be maintained Outcome: Progressing   Problem: Coping: Goal: Level of anxiety will decrease Outcome: Progressing   Problem: Elimination: Goal: Will not experience complications related to bowel motility Outcome: Progressing Goal: Will not experience complications related to urinary retention Outcome: Progressing   Problem: Pain Managment: Goal: General experience of comfort will improve Outcome: Progressing   Problem: Safety: Goal: Ability to remain free from injury will improve Outcome: Progressing   Problem: Skin Integrity: Goal: Risk for impaired skin integrity will decrease Outcome: Progressing   Problem: Education: Goal: Knowledge of secondary prevention will improve Outcome: Progressing Goal: Knowledge of patient specific risk factors addressed and post discharge goals established will improve Outcome: Progressing Goal: Individualized Educational Video(s) Outcome: Progressing   Problem: Education: Goal: Knowledge of disease or condition will improve Outcome: Progressing Goal: Knowledge of secondary prevention will improve Outcome: Progressing Goal: Knowledge of patient specific  risk factors addressed and post discharge goals established will improve Outcome: Progressing Goal: Individualized Educational Video(s) Outcome: Progressing   Problem: Coping: Goal: Will verbalize positive feelings about self Outcome: Progressing Goal: Will identify appropriate support needs Outcome: Progressing   Problem: Health Behavior/Discharge Planning: Goal: Ability to manage health-related needs will improve Outcome: Progressing   Problem: Self-Care: Goal: Ability to participate in self-care as condition permits will improve Outcome: Progressing Goal: Verbalization of feelings and concerns over difficulty with self-care will improve Outcome: Progressing Goal: Ability to communicate needs accurately will improve Outcome: Progressing   Problem: Nutrition: Goal: Risk of aspiration will decrease Outcome: Progressing Goal: Dietary intake will improve Outcome: Progressing

## 2018-01-04 NOTE — Progress Notes (Signed)
PT Cancellation Note  Patient Details Name: DARENDA FIKE MRN: [id number] DOB: Jun 05, 1950   Cancelled Treatment:    Reason Eval/Treat Not Completed: Fatigue/lethargy limiting ability to participate(multiple attempts to arouse pt and have her actively participate with PT, however, pt would immediately return to snoring and would not engage with PT. Will check back at a later time day)    Geraldine Solar PT, DPT

## 2018-01-05 LAB — GLUCOSE, CAPILLARY
Glucose-Capillary: 114 mg/dL — ABNORMAL HIGH (ref 70–99)
Glucose-Capillary: 102 mg/dL — ABNORMAL HIGH (ref 70–99)

## 2018-01-05 NOTE — Progress Notes (Signed)
PROGRESS NOTE  Cassandra Jordan  JAS:[id number]  DOB: 12/04/1950  DOA: 12/23/2017 PCP: [facility]  Brief Admission Hx: 67 y.o. female chronic cocaine abuser, smoker, diabetic, with known cerebrovascular and heart disease presented to ED with complaints of a frontal headache and acute CVA.    MDM/Assessment & Plan:   1. Acute CVA - MRI reveals multiple acute infarcts in deep white matter and along the left PICA territory.  Pt is on CVA treatment protocol.  Neuro recommends dual antiplatelet therapy which she is currently receiving.  Avoid abrupt and rapid drop in blood pressure. Pt is eating now and able to take medications.    2. 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  We will plan on discussing with vascular surgery when she has stabilized.  Neurology has recommended 30 day event monitor and this is been arranged through cardiology.   3. Dyslipidemia - LDL of 181 - continue atorvastatin 80 mg daily when able to take medications by mouth. 4. Prolonged QTc - monitoring on telemetry.  Monitor lytes.  Repeat EKG 12/13 shows persistent QTc prolongation.   Trying to avoid QT prolonging agents.  5. Aspiration pneumonia -she is completed a course of intravenous Unasyn.  SLP evaluated and placed on Dys 2, thin.   6. Acute respiratory distress -resolved now.  Pt briefly required CPAP/BiPAP.  Pulmonology followed.  She is now off nasal cannula and has oxygen saturations in the 90s. 7. COPD - Stable.  No wheezing at this time.  Continue bronchodilators as needed.  8. Acute alcohol / recreational drug withdrawal-resolved. Ativan and morphine have been discontinued. 9. Essential hypertension -blood pressures remain elevated.  Resuming oral BP meds.   10. PSVT - cardiology has restarted patient on her home amiodarone and carvedilol. 11. Type 2 DM - CBG Q4h while NPO plus SSI  coverage. A1c is 6.7% which is at ADA goal.  12. Cocaine abuse - Social worker consulted to discuss treatment options with patient.   13. Acute encephalopathy - Improving slowly.  This all seems to be related to her strokes.  She was seen again by neurology who agreed.  Repeat CT did not show any further findings to suggest worsening of cerebrovascular disease.  EEG is normal.   This is likely her new baseline per neurology.  I have asked for a palliative care consult to start goals of care discussions with family.  Lorazepam ordered as needed.   Unable to give antipsychotics due to persistently prolonged QT interval.  14. Hypernatremia - Sodium has improved with hypotonic fluids. 15. Tobacco abuse - nicotine patch ordered.   DVT prophylaxis: lovenox Code Status: Full  Family Communication: Family meeting with sister and niece 12/14.  Family wants to pursue SNF rehab or CIR.    Disposition Plan: SNF placement pending  Consultants:  Neurology  Cardiology  Pulmonology  Antimicrobials:  Unasyn 12/5>12/10  Subjective: Pt sleepy but arousable, No complaints.  Fidgeting but otherwise has not been calling out.       Objective: Vitals:   01/05/18 0734 01/05/18 0800 01/05/18 0958 01/05/18 1000  BP:  136/82 (!) 151/85 (!) 145/86  Pulse: 65 64 68 69  Resp: (!) 22 (!) 26    Temp: (!) 97.4 F (36.3 C)     TempSrc: Axillary     SpO2: 91% 95% 91% 92%  Weight:      Height:  Intake/Output Summary (Last 24 hours) at 01/05/2018 1032 Last data filed at 01/05/2018 0900 Gross per 24 hour  Intake 1190.06 ml  Output 2650 ml  Net -1459.94 ml   Filed Weights   01/02/18 1323 01/03/18 0600 01/05/18 0500  Weight: 67.8 kg 66.4 kg 66.8 kg   Exam:  General exam: remains confused at times but could be re-oriented and answer questions appropriately. Respiratory system: Clear to auscultation. Respiratory effort normal. Cardiovascular system: normal s1,s2 sounds.  No murmurs, rubs,  gallops. Gastrointestinal system: Abdomen is nondistended, soft and nontender. No organomegaly or masses felt. Normal bowel sounds heard. Central nervous system:.  Moving all extremities spontaneously.  persistent left-sided facial droop. Normal speech.  Extremities: No C/C/E, +pedal pulses Skin: No rashes, lesions or ulcers Psychiatry: disoriented but able to follow verbal cues and re-orient with assistance.   Data Reviewed: Basic Metabolic Panel: Recent Labs  Lab 12/30/17 0643 12/31/17 0418 01/01/18 0403 01/03/18 0405 01/04/18 0448  NA 148* 149* 141 138 138  K 3.5 3.6 3.3* 3.5 4.0  CL 114* 115* 109 107 107  CO2 23 24 25 24 24   GLUCOSE 97 114* 173* 125* 111*  BUN 24* 24* 18 12 14   CREATININE 1.33* 1.45* 1.36* 1.39* 1.51*  CALCIUM 8.9 8.5* 8.3* 8.6* 8.4*  MG 2.0  --   --  1.7  --   PHOS  --   --   --   --  3.2   Liver Function Tests: Recent Labs  Lab 12/30/17 0643 12/31/17 0418 01/04/18 0448  AST 45* 42*  --   ALT 25 24  --   ALKPHOS 41 39  --   BILITOT 1.0 0.8  --   PROT 7.7 7.4  --   ALBUMIN 2.8* 2.7* 2.5*   No results for input(s): LIPASE, AMYLASE in the last 168 hours. No results for input(s): AMMONIA in the last 168 hours. CBC: Recent Labs  Lab 12/30/17 0643 12/31/17 0418 01/03/18 0405 01/04/18 0448  WBC 10.9* 10.2 12.6* 12.8*  NEUTROABS 6.6 5.8  --  5.1  HGB 10.4* 10.0* 10.8* 10.7*  HCT 34.1* 33.2* 35.3* 34.4*  MCV 90.7 91.0 88.5 90.1  PLT 371 356 349 325   Cardiac Enzymes: No results for input(s): CKTOTAL, CKMB, CKMBINDEX, TROPONINI in the last 168 hours. CBG (last 3)  Recent Labs    01/04/18 1652 01/04/18 2133 01/05/18 0736  GLUCAP 108* 122* 114*   No results found for this or any previous visit (from the past 240 hour(s)).   Studies: No results found. Scheduled Meds: . amiodarone  100 mg Oral Daily  . amLODipine  5 mg Oral Daily  . aspirin  325 mg Oral Daily   Or  . aspirin  300 mg Rectal Daily  . atorvastatin  80 mg Oral q1800  .  carvedilol  6.25 mg Oral BID WC  . cloNIDine  0.1 mg Transdermal Weekly  . clopidogrel  75 mg Oral Q breakfast  . enoxaparin (LOVENOX) injection  40 mg Subcutaneous Q24H  . feeding supplement (ENSURE ENLIVE)  237 mL Oral BID BM  . folic acid  1 mg Oral Daily  . Influenza vac split quadrivalent PF  0.5 mL Intramuscular Tomorrow-1000  . insulin aspart  0-9 Units Subcutaneous TID WC  . mouth rinse  15 mL Mouth Rinse BID  . multivitamin with minerals  1 tablet Oral Daily  . nicotine  21 mg Transdermal Daily  . thiamine  100 mg Oral Daily   Or  .  thiamine  100 mg Intravenous Daily   Continuous Infusions: . sodium chloride 10 mL/hr at 01/04/18 0900  . dextrose 5 % with KCl 20 mEq / L 50 mL/hr at 01/05/18 0900    Principal Problem:   Acute CVA (cerebrovascular accident) [facility]) Active Problems:   Goals of care, counseling/discussion   Essential hypertension   PSVT (paroxysmal supraventricular tachycardia) (HCC)   Chest pain   Diabetes mellitus, type II (HCC)   Tobacco abuse   Cocaine abuse (HCC)   Generalized weakness   Hepatic cirrhosis (HCC)   GERD (gastroesophageal reflux disease)   Leukocytosis   Hypomagnesemia   Uncontrolled type 2 diabetes mellitus with hyperglycemia, with long-term current use of insulin (HCC)   CKD (chronic kidney disease), stage III (HCC)   Abnormal EKG   Cerebellar stroke [facility])   Palliative care by specialist  Critical Care Time spent: 34 minutes  Irwin Brakeman, MD [facility] Pager [phone]  If 7PM-7AM, please contact night-coverage [email or web address] Password [email or web address] 01/05/2018, 10:32 AM    LOS: 13 days

## 2018-01-05 NOTE — Progress Notes (Signed)
Physical Therapy Treatment Patient Details Name: Cassandra Jordan MRN: [id number] DOB: 05-06-1950 Today's Date: 01/05/2018    History of Present Illness Cassandra Jordan is a 67 y.o. female chronic cocaine abuser, smoker, diabetic, with known cerebrovascular and heart disease presented to ED with complaints of a frontal headache that started this morning.  The patient has since resolved but she initially describe pain as a dull headache 9/10 associtated with mild nausea and photophobia.  She also reported dull 8/10 chest pain and pressure described as achy dull pain in upper sternal area.  She was given nitroglycerin in ED and now she says she does not have chest pain.  She says she last used cocaine yesterday evening around 7pm.  She denies shortness of breath.  She has a strong family history of heart disease and premature cardiac death in her father that does in 55s of heart disease.     PT Comments    Pt more alert this morning and agreed to PT treatment. Pt with difficulty following one-step commands consistently which limited session to bed-level therex only. She attained to the LLE relatively well and she was able to intermittently perform A/AROM but ROM was mostly passive due to her inability to follow commands consistently. Pt fidgety and trying to exit bed at one point but relaxed with cues to remain supine in bed. Continue to recommend venue below to address her deficits and limitations in order to improve strength and QOL.    Follow Up Recommendations  SNF     Equipment Recommendations  None recommended by PT    Recommendations for Other Services       Precautions / Restrictions Precautions Precautions: Fall Restrictions Weight Bearing Restrictions: No    Mobility  Bed Mobility Overal bed mobility: Needs Assistance Bed Mobility: Rolling Rolling: Mod assist;Max assist         General bed mobility comments: cues to roll and mod-max A to complete roll to the  R  Transfers                 General transfer comment: unable to get EOB due to difficulty following 1-step commands and pt in and out of sleeping  Ambulation/Gait             General Gait Details: unable to perform today   Stairs             Wheelchair Mobility    Modified Rankin (Stroke Patients Only)       Balance                                            Cognition Arousal/Alertness: Awake/alert;Lethargic Behavior During Therapy: Flat affect Overall Cognitive Status: Impaired/Different from baseline Area of Impairment: Following commands;Attention                       Following Commands: Follows one step commands inconsistently       General Comments: pt only able to follow 1-step commands very intermittently; frequently falling asleep and disengaged with therapist      Exercises Total Joint Exercises Ankle Circles/Pumps: AAROM;PROM;Both;10 reps;Supine Heel Slides: PROM;AAROM;Both;10 reps;Supine Hip ABduction/ADduction: PROM;AAROM;Both;10 reps;Supine Straight Leg Raises: PROM;AAROM;Both;10 reps;Supine Bridges: Limitations Bridges Limitations: pt performed 1 rep and then became very fidgety and tried to exit the bed to the L; cues for pt to remain supine  General Comments        Pertinent Vitals/Pain Pain Assessment: No/denies pain    Home Living                      Prior Function            PT Goals (current goals can now be found in the care plan section) Acute Rehab PT Goals Patient Stated Goal: return home PT Goal Formulation: With patient Time For Goal Achievement: 01/17/18 Potential to Achieve Goals: Good    Frequency    7X/week      PT Plan      Co-evaluation              AM-PAC PT "6 Clicks" Mobility   Outcome Measure  Help needed turning from your back to your side while in a flat bed without using bedrails?: Total Help needed moving from lying on your back to  sitting on the side of a flat bed without using bedrails?: Total Help needed moving to and from a bed to a chair (including a wheelchair)?: Total Help needed standing up from a chair using your arms (e.g., wheelchair or bedside chair)?: Total Help needed to walk in hospital room?: Total Help needed climbing 3-5 steps with a railing? : Total 6 Click Score: 6    End of Session   Activity Tolerance: Patient limited by fatigue;Patient limited by lethargy;Treatment limited secondary to agitation Patient left: in bed;with call bell/phone within reach;with bed alarm set Nurse Communication: Mobility status PT Visit Diagnosis: Unsteadiness on feet (R26.81);Other abnormalities of gait and mobility (R26.89);Muscle weakness (generalized) (M62.81)     Time: 2233-6122 PT Time Calculation (min) (ACUTE ONLY): 19 min  Charges:  $Therapeutic Activity: 8-22 mins                        Geraldine Solar PT, DPT

## 2018-01-05 NOTE — Plan of Care (Signed)
  Problem: Education: Goal: Knowledge of secondary prevention will improve Outcome: Progressing   Problem: Health Behavior/Discharge Planning: Goal: Ability to manage health-related needs will improve Outcome: Progressing   Problem: Self-Care: Goal: Ability to communicate needs accurately will improve Outcome: Progressing   Problem: Nutrition: Goal: Risk of aspiration will decrease Outcome: Progressing Goal: Dietary intake will improve Outcome: Progressing

## 2018-01-06 ENCOUNTER — Encounter (HOSPITAL_COMMUNITY): Payer: Self-pay | Admitting: Family Medicine

## 2018-01-06 DIAGNOSIS — Z7189 Other specified counseling: Secondary | ICD-10-CM

## 2018-01-06 LAB — GLUCOSE, CAPILLARY: Glucose-Capillary: 106 mg/dL — ABNORMAL HIGH (ref 70–99)

## 2018-01-06 MED ORDER — LORAZEPAM 2 MG/ML IJ SOLN
1.0000 mg | Freq: Once | INTRAMUSCULAR | Status: AC
  Administered 2018-01-06: 1 mg via INTRAVENOUS
  Filled 2018-01-06: qty 1

## 2018-01-06 MED ORDER — POTASSIUM CL IN DEXTROSE 5% 20 MEQ/L IV SOLN: 20.0000 meq | INTRAVENOUS | Status: DC

## 2018-01-06 MED ORDER — POTASSIUM CL IN DEXTROSE 5% 20 MEQ/L IV SOLN
20.0000 meq | INTRAVENOUS | Status: DC
  Filled 2018-01-06: qty 1000

## 2018-01-06 NOTE — Progress Notes (Signed)
Pt is continuously trying to get OOB- pt is constantly reoriented and redirected with no success. Pt will not take po Ativan. Dr. Darrick Meigs paged to see if an IV dose of Ativan can be ordered. Waiting for orders/call back.

## 2018-01-06 NOTE — Progress Notes (Signed)
Daily Progress Note   Patient Name: Cassandra Jordan       Date: 01/06/2018 DOB: 1950/09/07  Age: 67 y.o. MRN#: [id number] Attending Physician: Murlean Iba, MD Primary Care Physician: [facility] Date: 12/23/2017  Reason for Consultation/Follow-up: Establishing goals of care  Subjective: Patient awake in bed, restless. Tangled up in lines from pulse ox, cardiac monitoring, IV and BP. Noted monitor not on. Able to tell me where she is, that she enjoys watching Madea, but then tangentially tells me that we need to "get in line to go buy a fur coat". I took removed her pulse ox and cardiac monitoring lines, removed her mitts. This calmed her some. Fed her half a magic cup that she appeared to enjoy. Denied mouth pain. She was able to take the spoon and feed herself after I put the ice cream on the spoon.  No family at bedside.  Review of Systems  Unable to perform ROS: Acuity of condition    Length of Stay: 14  Current Medications: Scheduled Meds:  . amiodarone  100 mg Oral Daily  . amLODipine  5 mg Oral Daily  . aspirin  325 mg Oral Daily   Or  . aspirin  300 mg Rectal Daily  . atorvastatin  80 mg Oral q1800  . carvedilol  6.25 mg Oral BID WC  . cloNIDine  0.1 mg Transdermal Weekly  . clopidogrel  75 mg Oral Q breakfast  . enoxaparin (LOVENOX) injection  40 mg Subcutaneous Q24H  . feeding supplement (ENSURE ENLIVE)  237 mL Oral BID BM  . folic acid  1 mg Oral Daily  . Influenza vac split quadrivalent PF  0.5 mL Intramuscular Tomorrow-1000  . mouth rinse  15 mL Mouth Rinse BID  . multivitamin with minerals  1 tablet Oral Daily  . nicotine  21 mg Transdermal Daily  . thiamine  100 mg Oral Daily   Or  . thiamine  100 mg Intravenous Daily     Continuous Infusions: . sodium chloride 10 mL/hr at 01/04/18 0900  . dextrose 5 % with KCl 20 mEq / L 35 mL/hr at 01/06/18 1243    PRN Meds: sodium chloride, acetaminophen **OR** acetaminophen (TYLENOL) oral liquid 160 mg/5 mL **OR** acetaminophen, albuterol, hydrALAZINE, LORazepam, nitroGLYCERIN, senna-docusate  Physical Exam  Vitals signs and nursing note reviewed.  Constitutional:      Appearance: She is well-developed and well-nourished.  Pulmonary:     Effort: Pulmonary effort is normal.  Skin:    General: Skin is warm and dry.  Neurological:     Mental Status: She is alert.     Comments: Oriented to person and place  Psychiatric:     Comments: Tangential thoughts, some appropriate thought content             Vital Signs: BP (!) 183/89   Pulse 64   Temp 97.9 F (36.6 C) (Axillary)   Resp 17   Ht 5' (1.524 m)   Wt 64.6 kg   SpO2 95%   BMI 27.81 kg/m  SpO2: SpO2: 95 % O2 Device: O2 Device: Room Air O2 Flow Rate: O2 Flow Rate (L/min): 2 L/min  Intake/output summary:   Intake/Output Summary (Last 24 hours) at 01/06/2018 1402 Last data filed at 01/06/2018 1137 Gross per 24 hour  Intake 945.69 ml  Output 1850 ml  Net -904.31 ml   LBM: Last BM Date: 01/03/18 Baseline Weight: Weight: 66.2 kg Most recent weight: Weight: 64.6 kg       Palliative Assessment/Data: PPS: 50%      Patient Active Problem List   Diagnosis Date Noted  . Palliative care by specialist   . Cerebellar stroke ([place])   . Abnormal EKG 12/23/2017  . Hyperphosphatemia 05/22/2017  . Acute CVA (cerebrovascular accident) ([place]) 05/21/2017  . Abdominal pain 11/18/2016  . Cholelithiasis 11/18/2016  . Uncontrolled type 2 diabetes mellitus with hyperglycemia, with long-term current use of insulin ([place]) 11/18/2016  . CKD (chronic kidney disease), stage III ([place]) 11/18/2016  . Acute pancreatitis   . Ileus ([place])   . Porcelain gallbladder   . Acute renal failure superimposed on stage 3  chronic kidney disease ([place]) 11/04/2016  . Hypokalemia 11/01/2016  . Hypomagnesemia 11/01/2016  . SBO (small bowel obstruction) ([place])   . AKI (acute kidney injury) ([place]) 10/30/2016  . Leukocytosis 10/30/2016  . GERD (gastroesophageal reflux disease) 10/29/2016  . Small bowel obstruction ([place]) 10/29/2016  . History of drug use 07/17/2016  . Constipation 05/30/2016  . Fecal incontinence 01/04/2016  . Hepatic cirrhosis ([place]) 04/08/2015  . History of adenomatous polyp of colon   . History of colonic polyps   . Hx of adenomatous colonic polyps 12/10/2014  . Lichen sclerosus et atrophicus 10/13/2014  . Vaginal itching 10/01/2014  . Vaginal irritation 10/01/2014  . Yeast infection of the vagina 10/01/2014  . Blurred vision, right eye 03/20/2014  . Cocaine abuse ([place]) 03/20/2014  . Generalized weakness 03/20/2014  . Chest pain 02/10/2014  . Chest pain at rest 02/10/2014  . Type 2 diabetes mellitus ([place])   . Diabetes mellitus, type II ([place])   . Tobacco abuse   . PSVT (paroxysmal supraventricular tachycardia) ([place]) 11/04/2013  . Essential hypertension 10/16/2013  . Renal mass 10/14/2013  . Ovarian mass, left 08/17/2013  . Hepatitis C, chronic ([place]) 07/01/2013  . Elevated LFTs 07/01/2013  . Other dysphagia 07/01/2013  . Goals of care, counseling/discussion 07/01/2013    Palliative Care Assessment & Plan   Patient Profile: 67 y.o. female  with past medical history of DM type 2, OSA, renal carcinoma, PSVT, HLD, hepatitis C, GERD, HTN, cocaine and ETOH abuse, athritis admitted on 12/23/2017 with headache. 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  Ongoing encephalopathy. Off BiPAP. Completed antibiotics for aspiration pneumonia. Palliative medicine consultation for goals of care.    Assessment/Recommendations/Plan   Noted need for mitts  to be d/c'd for 24 hours before patient can d/c to rehab- I dc'd mitts and patient appeared calm, did not pull at lines- I also dc'd other lines (pulse ox, bp, cardiac monitor d/t pt is not currently being monitored)- recommending keeping patient's bed as clear of lines that she might come entangled in as possible- if OT has a busy board this may help as well  Will attempt to call patient's daughter and check in- will try and address Code Status and other anticipatory and advanced care planning needs  Delirium precautions  Goals of Care and Additional Recommendations:  Limitations on Scope of Treatment: Full Scope Treatment  Code Status:  Full code  Prognosis:   Unable to determine  Discharge Planning:  [place] for rehab with Palliative care service follow-up would be ideal- unfortunately there is no outpatient Palliative available in this area- patient will go to rehab without outpatient Palliative followup  Care plan was discussed with patient's RN  Thank you for allowing the Palliative Medicine Team to assist in the care of this patient.   Time In: 1300 Time Out: 1335 Total Time 35 minutes Prolonged Time Billed No      Greater than 50%  of this time was spent counseling and coordinating care related to the above assessment and plan.  Mariana Kaufman, AGNP-C Palliative Medicine   Please contact Palliative Medicine Team phone at [phone] for questions and concerns.

## 2018-01-06 NOTE — Progress Notes (Signed)
Additional non face to face time-  I called patient's daughter for Palliative follow-up. Noted that she was hopeful for her mother to discharge to SNF for rehab. Offered to meet for additional advanced care planning- she is unavailable to meet during the week when PMT provider is here. I gave her update about my visit with her mother and let her know I would continue to follow along and intervene if needed.   Mariana Kaufman, AGNP-C Palliative Medicine  Time In: 7373 Time Out:1415 Total Time:30 mins Prolonged services billed: yes  Greater than 50%  of this time was spent counseling and coordinating care related to the above assessment and plan

## 2018-01-06 NOTE — Clinical Social Work Note (Signed)
CSW following. Pt discussed in Progression today. Pt remains disoriented and has mitts on. At the present time, pt has one bed offer from [facility]. Pt needs to be without mitts for 24 hours before she can transition to SNF. Will follow.

## 2018-01-06 NOTE — Progress Notes (Signed)
  Speech Language Pathology Treatment: Dysphagia  Patient Details Name: Cassandra Jordan MRN: [id number] DOB: 07-05-1950 Today's Date: 01/06/2018 Time: 3838-1840 SLP Time Calculation (min) (ACUTE ONLY): 22 min  Assessment / Plan / Recommendation Clinical Impression  Pt seen for ongoing diagnostic dysphagia intervention. BSE completed last Thursday and Pt has been receiving D2 and thin liquids. Nursing staff reports po intake is dependent upon Pt's level of alertness. Pt presented with oral holding and expectoration of D2 as she stated that it was "too salty" and would make her "swell up". Pt required verbal cues for intake and to facilitate oral phase of swallow due to holding with Magic cup. Pt consumed 100% Magic Cup and ~60 ml Ensure and tea without overt signs or symptoms of aspiration. Recommend continued D2 and thin liquids with aspiration precautions and SLP to follow during acute stay.   HPI HPI: 67 y.o. female chronic cocaine abuser, smoker, diabetic, with known cerebrovascular and heart disease presented to ED with complaints of a frontal headache and acute CVA. Acute CVA - MRI reveals multiple acute infarcts in deep white matter and along the left PICA territory.  Pt is on CVA treatment protocol.  Neuro recommends dual antiplatelet therapy.  Avoid abrupt and rapid drop in blood pressure.  Managing blood pressure has been challenging as she has been unable to take anything by mouth. Aspiration pneumonia -she is completed a course of intravenous Unasyn.  SLP evaluation requested as her mental status is improving enough for an evaluation. Acute alcohol / recreational drug withdrawal-resolved. Ativan and morphine have been discontinued. Acute encephalopathy - This all seems to be related to her strokes.  She was seen again by neurology who agreed.  Repeat CT did not show any further findings to suggest worsening of cerebrovascular disease.  EEG is pending for today.  This is likely her new baseline  per neurology.  MD asked for a palliative care consult to start goals of care discussions with family. BSE requested.      SLP Plan  Continue with current plan of care       Recommendations  Diet recommendations: Dysphagia 2 (fine chop);Thin liquid Liquids provided via: Cup;Straw Medication Administration: Crushed with puree Supervision: Staff to assist with self feeding;Full supervision/cueing for compensatory strategies Compensations: Slow rate;Small sips/bites;Minimize environmental distractions;Lingual sweep for clearance of pocketing;Follow solids with liquid Postural Changes and/or Swallow Maneuvers: Seated upright 90 degrees;Upright 30-60 min after meal                Oral Care Recommendations: Oral care BID;Staff/trained caregiver to provide oral care Follow up Recommendations: Skilled Nursing facility SLP Visit Diagnosis: Dysphagia, unspecified (R13.10) Plan: Continue with current plan of care       Thank you,  Genene Churn, [place]                 [place] 01/06/2018, 2:23 PM

## 2018-01-06 NOTE — Clinical Social Work Note (Addendum)
Pt referred for Level !! PASSR. [id number] H

## 2018-01-06 NOTE — Progress Notes (Addendum)
Pt was ambulated around the unit twice she tolerated it well and did not have any impulsiveness. Removed mitts off patient at 1400.   Jeris Penta, RN

## 2018-01-06 NOTE — Progress Notes (Signed)
PROGRESS NOTE  Cassandra Jordan  HER:[id number]  DOB: [birth year]  DOA: 12/23/2017 PCP: [facility]  Brief Admission Hx: 67 y.o. female chronic cocaine abuser, smoker, diabetic, with known cerebrovascular and heart disease presented to ED with complaints of a frontal headache and acute CVA.    MDM/Assessment & Plan:   1. Acute CVA - MRI reveals multiple acute infarcts in deep white matter and along the left PICA territory.  Pt is on CVA treatment protocol.  Neuro recommends dual antiplatelet therapy which she is currently receiving.  Avoid abrupt and rapid drop in blood pressure. Pt is eating now and able to take medications.    2. 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  We will plan on discussing with vascular surgery when she has stabilized.  Neurology has recommended 30 day event monitor and this is been arranged through cardiology.   3. Dyslipidemia - LDL of 181 - continue atorvastatin 80 mg daily when able to take medications by mouth. 4. Prolonged QTc - monitoring on telemetry.  Monitor lytes.  Repeat EKG 12/13 shows persistent QTc prolongation.   Trying to avoid QT prolonging agents.  5. Aspiration pneumonia -she is completed a course of intravenous Unasyn.  SLP evaluated and placed on Dys 2, thin.   6. Acute respiratory distress -resolved now.  Pt briefly required CPAP/BiPAP.  Pulmonology followed.  She is now off nasal cannula and has oxygen saturations in the 90s. 7. COPD - Stable.  No wheezing at this time.  Continue bronchodilators as needed.  8. Acute alcohol / recreational drug withdrawal-resolved. Ativan and morphine have been discontinued. 9. Essential hypertension -blood pressures remain elevated.  Resuming oral BP meds.   10. PSVT - cardiology has restarted patient on her home amiodarone and carvedilol. 11. Type 2 DM - CBG Q4h while NPO plus SSI  coverage. A1c is 6.7% which is at ADA goal.  12. Cocaine abuse - Social worker consulted to discuss treatment options with patient.   13. Acute encephalopathy - Improving slowly.  This all seems to be related to her strokes.  She was seen again by neurology who agreed.  Repeat CT did not show any further findings to suggest worsening of cerebrovascular disease.  EEG is normal.   This is likely her new baseline per neurology.  I have asked for a palliative care consult to start goals of care discussions with family.  Lorazepam ordered as needed.   Unable to give antipsychotics due to persistently prolonged QT interval.  14. Hypernatremia - resolved.  Sodium has improved with hypotonic fluids. 15. Tobacco abuse - nicotine patch ordered.   DVT prophylaxis: lovenox Code Status: Full  Family Communication: Family meeting with sister and niece 12/14.  Family wants to pursue SNF rehab or CIR.    Disposition Plan: SNF placement pending  Consultants:  Neurology  Cardiology  Pulmonology  Antimicrobials:  Unasyn 12/5>12/10  Subjective: Pt was fidgeting most of the night but is calm this morning and says she will eat breakfast. No complaints.        Objective: Vitals:   01/06/18 0400 01/06/18 0500 01/06/18 0535 01/06/18 0754  BP: 120/88  (!) 183/89   Pulse: 64   64  Resp: (!) 24  17 17   Temp: 98 F (36.7 C)   97.9 F (36.6 C)  TempSrc: Axillary   Axillary  SpO2: (!) 87%   95%  Weight:  64.6 kg    Height:        Intake/Output Summary (Last 24 hours) at 01/06/2018 1136 Last data filed at 01/06/2018 0400 Gross per 24 hour  Intake 945.69 ml  Output 1100 ml  Net -154.31 ml   Filed Weights   01/03/18 0600 01/05/18 0500 01/06/18 0500  Weight: 66.4 kg 66.8 kg 64.6 kg   Exam:  General exam: remains confused at times but could be re-oriented and answer questions appropriately. Respiratory system: Clear to auscultation. Respiratory effort normal. Cardiovascular system: normal s1,s2  sounds.  No murmurs, rubs, gallops. Gastrointestinal system: Abdomen is nondistended, soft and nontender. No organomegaly or masses felt. Normal bowel sounds heard. Central nervous system:.  Moving all extremities spontaneously.  persistent left-sided facial droop. Normal speech.  Extremities: No C/C/E, +pedal pulses Skin: No rashes, lesions or ulcers Psychiatry: disoriented but able to follow verbal cues and re-orient with assistance.   Data Reviewed: Basic Metabolic Panel: Recent Labs  Lab 12/31/17 0418 01/01/18 0403 01/03/18 0405 01/04/18 0448  NA 149* 141 138 138  K 3.6 3.3* 3.5 4.0  CL 115* 109 107 107  CO2 24 25 24 24   GLUCOSE 114* 173* 125* 111*  BUN 24* 18 12 14   CREATININE 1.45* 1.36* 1.39* 1.51*  CALCIUM 8.5* 8.3* 8.6* 8.4*  MG  --   --  1.7  --   PHOS  --   --   --  3.2   Liver Function Tests: Recent Labs  Lab 12/31/17 0418 01/04/18 0448  AST 42*  --   ALT 24  --   ALKPHOS 39  --   BILITOT 0.8  --   PROT 7.4  --   ALBUMIN 2.7* 2.5*   No results for input(s): LIPASE, AMYLASE in the last 168 hours. No results for input(s): AMMONIA in the last 168 hours. CBC: Recent Labs  Lab 12/31/17 0418 01/03/18 0405 01/04/18 0448  WBC 10.2 12.6* 12.8*  NEUTROABS 5.8  --  5.1  HGB 10.0* 10.8* 10.7*  HCT 33.2* 35.3* 34.4*  MCV 91.0 88.5 90.1  PLT 356 349 325   Cardiac Enzymes: No results for input(s): CKTOTAL, CKMB, CKMBINDEX, TROPONINI in the last 168 hours. CBG (last 3)  Recent Labs    01/05/18 0736 01/05/18 1131 01/05/18 1604  GLUCAP 114* 102* 106*   No results found for this or any previous visit (from the past 240 hour(s)).   Studies: No results found. Scheduled Meds: . amiodarone  100 mg Oral Daily  . amLODipine  5 mg Oral Daily  . aspirin  325 mg Oral Daily   Or  . aspirin  300 mg Rectal Daily  . atorvastatin  80 mg Oral q1800  . carvedilol  6.25 mg Oral BID WC  . cloNIDine  0.1 mg Transdermal Weekly  . clopidogrel  75 mg Oral Q breakfast    . enoxaparin (LOVENOX) injection  40 mg Subcutaneous Q24H  . feeding supplement (ENSURE ENLIVE)  237 mL Oral BID BM  . folic acid  1 mg Oral Daily  . Influenza vac split quadrivalent PF  0.5 mL Intramuscular Tomorrow-1000  . mouth rinse  15 mL Mouth Rinse BID  . multivitamin with minerals  1 tablet Oral Daily  . nicotine  21 mg Transdermal Daily  . thiamine  100 mg Oral Daily   Or  . thiamine  100 mg Intravenous Daily   Continuous Infusions: . sodium chloride 10 mL/hr at 01/04/18 0900  . dextrose 5 % with KCl  20 mEq / L 50 mL/hr at 01/06/18 0400    Principal Problem:   Acute CVA (cerebrovascular accident) [facility]) Active Problems:   Goals of care, counseling/discussion   Essential hypertension   PSVT (paroxysmal supraventricular tachycardia) ([facility])   Chest pain   Diabetes mellitus, type II ([facility])   Tobacco abuse   Cocaine abuse ([facility])   Generalized weakness   Hepatic cirrhosis ([facility])   GERD (gastroesophageal reflux disease)   Leukocytosis   Hypomagnesemia   Uncontrolled type 2 diabetes mellitus with hyperglycemia, with long-term current use of insulin ([facility])   CKD (chronic kidney disease), stage III ([facility])   Abnormal EKG   Cerebellar stroke [facility])   Palliative care by specialist  Critical Care Time spent: 31 minutes  Irwin Brakeman, MD [facility] Pager [phone]  If 7PM-7AM, please contact night-coverage [email or web address] Password [email or web address] 01/06/2018, 11:36 AM    LOS: 14 days

## 2018-01-06 NOTE — Clinical Social Work Note (Signed)
Patient's daughter, Ms. Landry Corporal, accepts bed offer from [facility].   LCSW spoke with Gerald Stabs at [facility] and requested that authorization be started.    Ekansh Sherk, Clydene Pugh, LCSW

## 2018-01-06 NOTE — Progress Notes (Signed)
Physical Therapy Treatment Patient Details Name: Cassandra Jordan MRN: [id number] DOB: [birth year] Today's Date: 01/06/2018    History of Present Illness Cassandra Jordan is a 67 y.o. female chronic cocaine abuser, smoker, diabetic, with known cerebrovascular and heart disease presented to ED with complaints of a frontal headache that started this morning.  The patient has since resolved but she initially describe pain as a dull headache 9/10 associtated with mild nausea and photophobia.  She also reported dull 8/10 chest pain and pressure described as achy dull pain in upper sternal area.  She was given nitroglycerin in ED and now she says she does not have chest pain.  She says she last used cocaine yesterday evening around 7pm.  She denies shortness of breath.  She has a strong family history of heart disease and premature cardiac death in her father that does in 91s of heart disease.     PT Comments    Patient presents slightly lethargic and requires constant verbal/tactile cueing for safety due to impulsive behavior.  Patient demonstrates increased tolerance for taking steps at bedside, but limited for attempting to step away from bedside due to fall risk and left sided weakness.  Patient required active assistance to complete LLE exercises and occasionally neglects LUE when completing functional activities.  Patient put back to bed after therapy due to fatigue/lethargy.  Patient will benefit from continued physical therapy in hospital and recommended venue below to increase strength, balance, endurance for safe ADLs and gait.    Follow Up Recommendations  SNF     Equipment Recommendations  None recommended by PT    Recommendations for Other Services       Precautions / Restrictions Precautions Precautions: Fall Restrictions Weight Bearing Restrictions: No    Mobility  Bed Mobility Overal bed mobility: Needs Assistance Bed Mobility: Supine to Sit;Sit to Supine     Supine to  sit: Mod assist Sit to supine: Mod assist   General bed mobility comments: requires constant verbal/tactile cueing for safety  Transfers Overall transfer level: Needs assistance Equipment used: Rolling walker (2 wheeled) Transfers: Sit to/from [facility] Sit to Stand: Min assist;Mod assist Stand pivot transfers: Mod assist;Max assist       General transfer comment: leaning to left  Ambulation/Gait Ambulation/Gait assistance: Max assist Gait Distance (Feet): 5 Feet Assistive device: Rolling walker (2 wheeled) Gait Pattern/deviations: Decreased step length - left;Decreased stance time - left;Decreased stride length Gait velocity: slow   General Gait Details: limited to 3-4 steps forward/backwards and 5-6 side steps due to poor standing balance and lethargy   Stairs             Wheelchair Mobility    Modified Rankin (Stroke Patients Only)       Balance Overall balance assessment: Needs assistance Sitting-balance support: Feet supported;No upper extremity supported Sitting balance-Leahy Scale: Poor Sitting balance - Comments: fair/poor with frequent leaning to the left Postural control: Left lateral lean Standing balance support: Bilateral upper extremity supported;During functional activity Standing balance-Leahy Scale: Poor Standing balance comment: fair/poor using RW                            Cognition Arousal/Alertness: Awake/alert;Lethargic Behavior During Therapy: Flat affect Overall Cognitive Status: Impaired/Different from baseline Area of Impairment: Following commands;Attention                       Following Commands: Follows one step commands  inconsistently       General Comments: slightly lethargic      Exercises General Exercises - Lower Extremity Ankle Circles/Pumps: Seated;AROM;Strengthening;Both;10 reps Long Arc Quad: Seated;AROM;AAROM;Strengthening;Both;10 reps    General Comments         Pertinent Vitals/Pain Pain Assessment: No/denies pain    Home Living                      Prior Function            PT Goals (current goals can now be found in the care plan section) Acute Rehab PT Goals Patient Stated Goal: return home PT Goal Formulation: With patient Time For Goal Achievement: 01/17/18 Potential to Achieve Goals: Good Progress towards PT goals: Progressing toward goals    Frequency    7X/week      PT Plan Current plan remains appropriate    Co-evaluation              AM-PAC PT "6 Clicks" Mobility   Outcome Measure  Help needed turning from your back to your side while in a flat bed without using bedrails?: Total Help needed moving from lying on your back to sitting on the side of a flat bed without using bedrails?: Total Help needed moving to and from a bed to a chair (including a wheelchair)?: Total Help needed standing up from a chair using your arms (e.g., wheelchair or bedside chair)?: A Lot Help needed to walk in hospital room?: Total Help needed climbing 3-5 steps with a railing? : Total 6 Click Score: 7    End of Session Equipment Utilized During Treatment: Gait belt Activity Tolerance: Patient tolerated treatment well;Patient limited by fatigue Patient left: in bed;with call bell/phone within reach;with bed alarm set Nurse Communication: Mobility status PT Visit Diagnosis: Unsteadiness on feet (R26.81);Other abnormalities of gait and mobility (R26.89);Muscle weakness (generalized) (M62.81)     Time: 1010-1033 PT Time Calculation (min) (ACUTE ONLY): 23 min  Charges:  $Therapeutic Activity: 23-37 mins                     3:01 PM, 01/06/18 Lonell Grandchild, MPT Physical Therapist with [facility] [phone] office [phone] mobile phone

## 2018-01-07 NOTE — Progress Notes (Signed)
Patient remained anxious and attempting to get out of bed frequently after receiving ativan. Reoriented as needed, bedalarm remains on for safety, fall mats in place. Call light and personal items in reach. Notified Dr. Roderic Palau. Order received for Air cabin crew. Order placed. Nursing supervisor notified. Safety sitter at bedside. Donavan Foil, RN

## 2018-01-07 NOTE — Progress Notes (Signed)
Patient is alert to self and place. Reoriented to time and situation as needed. Attempting to get out of bed and states "I'm going home, call my daughter [name]" bedalarm on for safety, call light and personal items in reach, fall mats in place for safety. Notified Dr. Roderic Palau and requested order for safety sitter if possible. Stated would like nursing to try ativan as ordered PRN and notify him if it doesn't work. Ativan given as ordered. Will continue to monitor and attempt to reach her daughter as patient requested. Donavan Foil, RN

## 2018-01-07 NOTE — Progress Notes (Signed)
Physical Therapy Treatment Patient Details Name: Cassandra Jordan MRN: [id number] DOB: Mar 20, 1950 Today's Date: 01/07/2018    History of Present Illness Cassandra Jordan is a 67 y.o. female chronic cocaine abuser, smoker, diabetic, with known cerebrovascular and heart disease presented to ED with complaints of a frontal headache that started this morning.  The patient has since resolved but she initially describe pain as a dull headache 9/10 associtated with mild nausea and photophobia.  She also reported dull 8/10 chest pain and pressure described as achy dull pain in upper sternal area.  She was given nitroglycerin in ED and now she says she does not have chest pain.  She says she last used cocaine yesterday evening around 7pm.  She denies shortness of breath.  She has a strong family history of heart disease and premature cardiac death in her father that does in 28s of heart disease.     PT Comments    Patient presents more alert and agreeable for therapy after encouragement.  Patient demonstrates increased BLE strength and endurance for gait training, but requires constant verbal/tactile cueing for proper use of RW, able grip walker with left hand, but at times attempts to ambulate without using RW resulting in loss of balance and near falls.  Patient put back to bed after therapy to eat lunch.  Patient will benefit from continued physical therapy in hospital and recommended venue below to increase strength, balance, endurance for safe ADLs and gait.    Follow Up Recommendations  SNF     Equipment Recommendations  None recommended by PT    Recommendations for Other Services       Precautions / Restrictions Precautions Precautions: Fall Restrictions Weight Bearing Restrictions: No    Mobility  Bed Mobility Overal bed mobility: Needs Assistance Bed Mobility: Supine to Sit;Sit to Supine Rolling: Min assist   Supine to sit: Min assist;Mod assist Sit to supine: Min assist    General bed mobility comments: requires constant verbal/tactile cueing  Transfers Overall transfer level: Needs assistance Equipment used: Rolling walker (2 wheeled) Transfers: Sit to/from [facility] Sit to Stand: Min assist Stand pivot transfers: Min assist          Ambulation/Gait Ambulation/Gait assistance: Mod assist Gait Distance (Feet): 55 Feet Assistive device: Rolling walker (2 wheeled) Gait Pattern/deviations: Decreased step length - right;Decreased step length - left;Decreased stance time - left Gait velocity: slow   General Gait Details: increased endurance/distance for ambulation with frequent standing rest breaks, constant verbal/tactile cueing for safety and redirection to tasks   Stairs             Wheelchair Mobility    Modified Rankin (Stroke Patients Only)       Balance Overall balance assessment: Needs assistance Sitting-balance support: Feet supported;Single extremity supported Sitting balance-Leahy Scale: Fair Sitting balance - Comments: fair/good   Standing balance support: During functional activity;Bilateral upper extremity supported Standing balance-Leahy Scale: Fair Standing balance comment: fair using RW                            Cognition Arousal/Alertness: Awake/alert Behavior During Therapy: Restless;Impulsive Overall Cognitive Status: Impaired/Different from baseline Area of Impairment: Following commands;Attention                       Following Commands: Follows one step commands with increased time       General Comments: more alert today  Exercises      General Comments        Pertinent Vitals/Pain Pain Assessment: No/denies pain    Home Living                      Prior Function            PT Goals (current goals can now be found in the care plan section) Acute Rehab PT Goals Patient Stated Goal: return home PT Goal Formulation: With patient Time  For Goal Achievement: 01/17/18 Potential to Achieve Goals: Good Progress towards PT goals: Progressing toward goals    Frequency    7X/week      PT Plan Current plan remains appropriate    Co-evaluation              AM-PAC PT "6 Clicks" Mobility   Outcome Measure  Help needed turning from your back to your side while in a flat bed without using bedrails?: Total Help needed moving from lying on your back to sitting on the side of a flat bed without using bedrails?: Total Help needed moving to and from a bed to a chair (including a wheelchair)?: Total Help needed standing up from a chair using your arms (e.g., wheelchair or bedside chair)?: A Little Help needed to walk in hospital room?: A Lot Help needed climbing 3-5 steps with a railing? : Total 6 Click Score: 9    End of Session Equipment Utilized During Treatment: Gait belt Activity Tolerance: Patient tolerated treatment well;Patient limited by fatigue Patient left: in bed;with restraints reapplied;with call bell/phone within reach;with bed alarm set Nurse Communication: Mobility status PT Visit Diagnosis: Unsteadiness on feet (R26.81);Other abnormalities of gait and mobility (R26.89);Muscle weakness (generalized) (M62.81)     Time: 2863-8177 PT Time Calculation (min) (ACUTE ONLY): 24 min  Charges:  $Therapeutic Activity: 23-37 mins                     2:42 PM, 01/07/18 Lonell Grandchild, MPT Physical Therapist with [facility] [phone] office [phone] mobile phone

## 2018-01-07 NOTE — Clinical Social Work Note (Signed)
LCSW following. Pt discussed in Progression this AM. Per MD, mitts are removed and pt appears to be improving. Pt awaiting Level II PASRR. Updated Gerald Stabs at [facility]. They are submitting for insurance authorization today. Will follow and assist with pt's transition of care needs.

## 2018-01-07 NOTE — Progress Notes (Signed)
PROGRESS NOTE  Cassandra Jordan  NAT:[id number]  DOB: 1950-03-27  DOA: 12/23/2017 PCP: [facility]  Brief Admission Hx: 67 y.o. female chronic cocaine abuser, smoker, diabetic, with known cerebrovascular and heart disease presented to ED with complaints of a frontal headache and acute CVA.    MDM/Assessment & Plan:   1. Acute CVA - MRI reveals multiple acute infarcts in deep white matter and along the left PICA territory.  Pt is on CVA treatment protocol.  Neuro recommends dual antiplatelet therapy which she is currently receiving.  Avoid abrupt and rapid drop in blood pressure. Pt is eating now and able to take medications.    2. 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  We will arrange for close follow-up with vascular surgery.  Neurology has recommended 30 day event monitor and this is been arranged through cardiology.   3. Dyslipidemia - LDL of 181 - continue atorvastatin  4. Prolonged QTc - monitoring on telemetry.  Monitor lytes.  Repeat EKG 12/13 shows persistent QTc prolongation.   Trying to avoid QT prolonging agents.  5. Aspiration pneumonia -she is completed a course of intravenous Unasyn.  SLP evaluated and placed on Dys 2, thin.   6. Acute respiratory distress -resolved now.  Pt briefly required CPAP/BiPAP.  Pulmonology followed.  She is now off nasal cannula and has oxygen saturations in the 90s. 7. COPD - Stable.  No wheezing at this time.  Continue bronchodilators as needed.  8. Acute alcohol / recreational drug withdrawal-resolved. Ativan and morphine have been discontinued. 9. Essential hypertension -blood pressure currently stable.  Continue oral medications.   10. PSVT - cardiology has restarted patient on her home amiodarone and carvedilol. 11. Type 2 DM - CBG Q4h while NPO plus SSI coverage. A1c is 6.7% which is at ADA goal.  12. Cocaine abuse -  Social worker consulted to discuss treatment options with patient.   13. Acute encephalopathy - Improving slowly.  This all seems to be related to her strokes.  She was seen again by neurology who agreed.  Repeat CT did not show any further findings to suggest worsening of cerebrovascular disease.  EEG is normal.   This is likely her new baseline per neurology. Lorazepam ordered as needed.   Unable to give antipsychotics due to persistently prolonged QT interval.  14. Hypernatremia - resolved.  Sodium has improved with hypotonic fluids. 15. Tobacco abuse - nicotine patch ordered.   DVT prophylaxis: lovenox Code Status: Full  Family Communication: Family meeting with sister and niece 12/14.  Family wants to pursue SNF rehab or CIR.    Disposition Plan: SNF placement pending  Consultants:  Neurology  Cardiology  Pulmonology  Antimicrobials:  Unasyn 12/5>12/10  Subjective: Patient wakes up on my arrival.  She does not know that she is in the hospital.  Says she is hungry and wants to eat lunch.      Objective: Vitals:   01/06/18 2224 01/06/18 2255 01/07/18 0829 01/07/18 1450  BP: (!) 151/84   133/73  Pulse: 67   68  Resp: 20   18  Temp: (!) 97.4 F (36.3 C)   99.3 F (37.4 C)  TempSrc: Oral  Oral Oral  SpO2: 98% 98%  96%  Weight:      Height:       No intake or output data in the 24 hours ending 01/07/18 [place]  01/03/18 0600 01/05/18 0500 01/06/18 0500  Weight: 66.4 kg 66.8 kg 64.6 kg   Exam:  General exam: Alert, awake, no distress Respiratory system: Clear to auscultation. Respiratory effort normal. Cardiovascular system:RRR. No murmurs, rubs, gallops. Gastrointestinal system: Abdomen is nondistended, soft and nontender. No organomegaly or masses felt. Normal bowel sounds heard. Extremities: No C/C/E, +pedal pulses Skin: No rashes, lesions or ulcers Psychiatry: Still has some confusion, but tries to engage in conversation    Data Reviewed: Basic  Metabolic Panel: Recent Labs  Lab 01/01/18 0403 01/03/18 0405 01/04/18 0448  NA 141 138 138  K 3.3* 3.5 4.0  CL 109 107 107  CO2 25 24 24   GLUCOSE 173* 125* 111*  BUN 18 12 14   CREATININE 1.36* 1.39* 1.51*  CALCIUM 8.3* 8.6* 8.4*  MG  --  1.7  --   PHOS  --   --  3.2   Liver Function Tests: Recent Labs  Lab 01/04/18 0448  ALBUMIN 2.5*   No results for input(s): LIPASE, AMYLASE in the last 168 hours. No results for input(s): AMMONIA in the last 168 hours. CBC: Recent Labs  Lab 01/03/18 0405 01/04/18 0448  WBC 12.6* 12.8*  NEUTROABS  --  5.1  HGB 10.8* 10.7*  HCT 35.3* 34.4*  MCV 88.5 90.1  PLT 349 325   Cardiac Enzymes: No results for input(s): CKTOTAL, CKMB, CKMBINDEX, TROPONINI in the last 168 hours. CBG (last 3)  Recent Labs    01/05/18 0736 01/05/18 1131 01/05/18 1604  GLUCAP 114* 102* 106*   No results found for this or any previous visit (from the past 240 hour(s)).   Studies: No results found. Scheduled Meds: . amiodarone  100 mg Oral Daily  . amLODipine  5 mg Oral Daily  . aspirin  325 mg Oral Daily   Or  . aspirin  300 mg Rectal Daily  . atorvastatin  80 mg Oral q1800  . carvedilol  6.25 mg Oral BID WC  . cloNIDine  0.1 mg Transdermal Weekly  . clopidogrel  75 mg Oral Q breakfast  . enoxaparin (LOVENOX) injection  40 mg Subcutaneous Q24H  . feeding supplement (ENSURE ENLIVE)  237 mL Oral BID BM  . folic acid  1 mg Oral Daily  . Influenza vac split quadrivalent PF  0.5 mL Intramuscular Tomorrow-1000  . mouth rinse  15 mL Mouth Rinse BID  . multivitamin with minerals  1 tablet Oral Daily  . nicotine  21 mg Transdermal Daily  . thiamine  100 mg Oral Daily   Or  . thiamine  100 mg Intravenous Daily   Continuous Infusions: . sodium chloride 10 mL/hr at 01/04/18 0900    Principal Problem:   Acute CVA (cerebrovascular accident) [facility]) Active Problems:   Goals of care, counseling/discussion   Essential hypertension   PSVT (paroxysmal  supraventricular tachycardia) (HCC)   Chest pain   Diabetes mellitus, type II (HCC)   Tobacco abuse   Cocaine abuse (HCC)   Generalized weakness   Hepatic cirrhosis (HCC)   GERD (gastroesophageal reflux disease)   Leukocytosis   Hypomagnesemia   Uncontrolled type 2 diabetes mellitus with hyperglycemia, with long-term current use of insulin (HCC)   CKD (chronic kidney disease), stage III (HCC)   Abnormal EKG   Cerebellar stroke [facility])   Palliative care by specialist   Advanced care planning/counseling discussion  Time spent: 30 minutes  Kathie Dike, MD [facility] Pager [phone]  If 7PM-7AM, please contact night-coverage [email or web address] Password [facility] 01/07/2018,  4:16 PM    LOS: 15 days

## 2018-01-07 NOTE — Progress Notes (Signed)
  Speech Language Pathology Treatment: Dysphagia  Patient Details Name: Cassandra Jordan MRN: [id number] DOB: 1950/03/02 Today's Date: 01/07/2018 Time: 1017-5102 SLP Time Calculation (min) (ACUTE ONLY): 27 min  Assessment / Plan / Recommendation Clinical Impression  Pt seen at bedside for ongoing dysphagia intervention during lunch meal. Pt received semi-reclined in bed attempting to self feed lunch. HOB increased and food removed from chest; Pt needs assist with self feeding. Pt continues to c/o gum pain when attempting to masticate semi-solids, but also refuses puree diet. She reports that she doesn't eat meats at home for this reason. Pt able to masticate minced green beans and meatballs when presented in mashed potatoes. Pt with prolonged oral prep, some oral holding, left buccal pocketing and lingual coating which diminishes with liquid wash. Pt without overt signs and symptoms of aspiration, however D2 diet continues to be appropriate due to oral phase deficits. Pt consumed 100% of her Magic Cup with SLP presentation. She is agreeable to tomato soup for other meals. Continue diet as ordered and f/u at SNF with SLP for dysphagia and cognition.   HPI HPI: 67 y.o. female chronic cocaine abuser, smoker, diabetic, with known cerebrovascular and heart disease presented to ED with complaints of a frontal headache and acute CVA. Acute CVA - MRI reveals multiple acute infarcts in deep white matter and along the left PICA territory.  Pt is on CVA treatment protocol.  Neuro recommends dual antiplatelet therapy.  Avoid abrupt and rapid drop in blood pressure.  Managing blood pressure has been challenging as she has been unable to take anything by mouth. Aspiration pneumonia -she is completed a course of intravenous Unasyn.  SLP evaluation requested as her mental status is improving enough for an evaluation. Acute alcohol / recreational drug withdrawal-resolved. Ativan and morphine have been discontinued. Acute  encephalopathy - This all seems to be related to her strokes.  She was seen again by neurology who agreed.  Repeat CT did not show any further findings to suggest worsening of cerebrovascular disease.  EEG is pending for today.  This is likely her new baseline per neurology.  MD asked for a palliative care consult to start goals of care discussions with family. BSE requested.      SLP Plan  Continue with current plan of care       Recommendations  Diet recommendations: Dysphagia 2 (fine chop);Thin liquid Liquids provided via: Cup;Straw Medication Administration: (can try whole in puree or with water when alert) Supervision: Staff to assist with self feeding;Full supervision/cueing for compensatory strategies Compensations: Slow rate;Small sips/bites;Minimize environmental distractions;Lingual sweep for clearance of pocketing;Follow solids with liquid Postural Changes and/or Swallow Maneuvers: Seated upright 90 degrees;Upright 30-60 min after meal                Oral Care Recommendations: Oral care BID;Staff/trained caregiver to provide oral care Follow up Recommendations: Skilled Nursing facility SLP Visit Diagnosis: Dysphagia, unspecified (R13.10) Plan: Continue with current plan of care       Thank you,  Genene Churn, [place]                 [place] 01/07/2018, 12:44 PM

## 2018-01-08 LAB — BASIC METABOLIC PANEL
ANION GAP: 6 (ref 5–15)
BUN: 15 mg/dL (ref 8–23)
CO2: 26 mmol/L (ref 22–32)
Calcium: 8.2 mg/dL — ABNORMAL LOW (ref 8.9–10.3)
Chloride: 106 mmol/L (ref 98–111)
Creatinine, Ser: 1.48 mg/dL — ABNORMAL HIGH (ref 0.44–1.00)
GFR calc Af Amer: 42 mL/min — ABNORMAL LOW (ref >60)
GFR calc non Af Amer: 37 mL/min — ABNORMAL LOW (ref >60)
GLUCOSE: 75 mg/dL (ref 70–99)
Potassium: 3.5 mmol/L (ref 3.5–5.1)
Sodium: 138 mmol/L (ref 135–145)

## 2018-01-08 LAB — CBC
HCT: 31.1 % — ABNORMAL LOW (ref 36.0–46.0)
Hemoglobin: 9.4 g/dL — ABNORMAL LOW (ref 12.0–15.0)
MCH: 27.2 pg (ref 26.0–34.0)
MCHC: 30.2 g/dL (ref 30.0–36.0)
MCV: 89.9 fL (ref 80.0–100.0)
Platelets: 250 10*3/uL (ref 150–400)
RBC: 3.46 MIL/uL — ABNORMAL LOW (ref 3.87–5.11)
RDW: 14.4 % (ref 11.5–15.5)
WBC: 8.7 10*3/uL (ref 4.0–10.5)
nRBC: 0 % (ref 0.0–0.2)

## 2018-01-08 LAB — URINALYSIS, ROUTINE W REFLEX MICROSCOPIC
Bilirubin Urine: NEGATIVE
Glucose, UA: NEGATIVE mg/dL
Ketones, ur: NEGATIVE mg/dL
Nitrite: NEGATIVE
PROTEIN: 100 mg/dL — AB
Specific Gravity, Urine: 1.008 (ref 1.005–1.030)
pH: 8 (ref 5.0–8.0)

## 2018-01-08 NOTE — Progress Notes (Signed)
Patient complaining of burning during voiding. Patient has increased urgency and frequency and odorous urine output. MD has been notified.

## 2018-01-08 NOTE — Progress Notes (Signed)
PROGRESS NOTE  Cassandra Jordan  QAS:[id number]  DOB: May 05, 1950  DOA: 12/23/2017 PCP: [facility]  Brief Admission Hx: 67 y.o. female chronic cocaine abuser, smoker, diabetic, with known cerebrovascular and heart disease presented to ED with complaints of a frontal headache and acute CVA.    MDM/Assessment & Plan:   1. Acute CVA - MRI reveals multiple acute infarcts in deep white matter and along the left PICA territory.  Pt is on CVA treatment protocol.  Neuro recommends dual antiplatelet therapy which she is currently receiving.  Avoid abrupt and rapid drop in blood pressure. Pt is eating now and able to take medications.    2. 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  We will arrange for close follow-up with vascular surgery.  Neurology has recommended 30 day event monitor and this is been arranged through cardiology.   3. Dyslipidemia - LDL of 181 - continue atorvastatin  4. Prolonged QTc - monitoring on telemetry.  Monitor lytes.  Repeat EKG 12/13 shows persistent QTc prolongation.   Trying to avoid QT prolonging agents.  5. Aspiration pneumonia -she is completed a course of intravenous Unasyn.  SLP evaluated and placed on Dys 2, thin.   6. Acute respiratory distress -resolved now.  Pt briefly required CPAP/BiPAP.  Pulmonology followed.  She is now off nasal cannula and has oxygen saturations in the 90s. 7. COPD - Stable.  No wheezing at this time.  Continue bronchodilators as needed.  8. Acute alcohol / recreational drug withdrawal-resolved. Ativan and morphine have been discontinued. 9. Essential hypertension -blood pressure currently stable.  Continue oral medications.   10. PSVT - cardiology has restarted patient on her home amiodarone and carvedilol. 11. Type 2 DM - CBG Q4h while NPO plus SSI coverage. A1c is 6.7% which is at ADA goal.  12. Cocaine abuse -  Social worker consulted to discuss treatment options with patient.   13. Acute encephalopathy - Improving slowly.  This all seems to be related to her strokes.  She was seen again by neurology who agreed.  Repeat CT did not show any further findings to suggest worsening of cerebrovascular disease.  EEG is normal.   This is likely her new baseline per neurology. Lorazepam ordered as needed.   Unable to give antipsychotics due to persistently prolonged QT interval.  We will see if family is available to help redirect her 89. Hypernatremia - resolved.  Sodium has improved with hypotonic fluids. 15. Tobacco abuse - nicotine patch ordered.   DVT prophylaxis: lovenox Code Status: Full  Family Communication: Family meeting with sister and niece 12/14.  Family wants to pursue SNF rehab or CIR.    Disposition Plan: SNF placement pending  Consultants:  Neurology  Cardiology  Pulmonology  Antimicrobials:  Unasyn 12/5>12/10  Subjective: Patient had sitter placed yesterday evening due to being impulsive.  She frequently tries to get out of bed.  Objective: Vitals:   01/07/18 2201 01/08/18 0300 01/08/18 0700 01/08/18 1300  BP: 136/68 139/73 (!) 159/71 136/81  Pulse: 69 68 67 90  Resp: 16 16 16 18   Temp: 97.6 F (36.4 C) 98.6 F (37 C) 97.9 F (36.6 C) 98.3 F (36.8 C)  TempSrc: Oral Oral Oral Oral  SpO2: 99% 98% 97% 98%  Weight:      Height:        Intake/Output Summary (Last 24 hours) at 01/08/2018 1428 Last data  filed at 01/08/2018 1021 Gross per 24 hour  Intake 0 ml  Output -  Net 0 ml   Filed Weights   01/03/18 0600 01/05/18 0500 01/06/18 0500  Weight: 66.4 kg 66.8 kg 64.6 kg   Exam:  General exam: Alert, awake, no distress Respiratory system: Clear to auscultation. Respiratory effort normal. Cardiovascular system:RRR. No murmurs, rubs, gallops. Gastrointestinal system: Abdomen is nondistended, soft and nontender. No organomegaly or masses felt. Normal bowel sounds  heard.    Data Reviewed: Basic Metabolic Panel: Recent Labs  Lab 01/03/18 0405 01/04/18 0448 01/08/18 0429  NA 138 138 138  K 3.5 4.0 3.5  CL 107 107 106  CO2 24 24 26   GLUCOSE 125* 111* 75  BUN 12 14 15   CREATININE 1.39* 1.51* 1.48*  CALCIUM 8.6* 8.4* 8.2*  MG 1.7  --   --   PHOS  --  3.2  --    Liver Function Tests: Recent Labs  Lab 01/04/18 0448  ALBUMIN 2.5*   No results for input(s): LIPASE, AMYLASE in the last 168 hours. No results for input(s): AMMONIA in the last 168 hours. CBC: Recent Labs  Lab 01/03/18 0405 01/04/18 0448 01/08/18 0429  WBC 12.6* 12.8* 8.7  NEUTROABS  --  5.1  --   HGB 10.8* 10.7* 9.4*  HCT 35.3* 34.4* 31.1*  MCV 88.5 90.1 89.9  PLT 349 325 250   Cardiac Enzymes: No results for input(s): CKTOTAL, CKMB, CKMBINDEX, TROPONINI in the last 168 hours. CBG (last 3)  Recent Labs    01/05/18 1604  GLUCAP 106*   No results found for this or any previous visit (from the past 240 hour(s)).   Studies: No results found. Scheduled Meds: . amiodarone  100 mg Oral Daily  . amLODipine  5 mg Oral Daily  . aspirin  325 mg Oral Daily   Or  . aspirin  300 mg Rectal Daily  . atorvastatin  80 mg Oral q1800  . carvedilol  6.25 mg Oral BID WC  . cloNIDine  0.1 mg Transdermal Weekly  . clopidogrel  75 mg Oral Q breakfast  . enoxaparin (LOVENOX) injection  40 mg Subcutaneous Q24H  . feeding supplement (ENSURE ENLIVE)  237 mL Oral BID BM  . folic acid  1 mg Oral Daily  . Influenza vac split quadrivalent PF  0.5 mL Intramuscular Tomorrow-1000  . mouth rinse  15 mL Mouth Rinse BID  . multivitamin with minerals  1 tablet Oral Daily  . nicotine  21 mg Transdermal Daily  . thiamine  100 mg Oral Daily   Or  . thiamine  100 mg Intravenous Daily   Continuous Infusions: . sodium chloride 10 mL/hr at 01/04/18 0900    Principal Problem:   Acute CVA (cerebrovascular accident) [facility]) Active Problems:   Goals of care, counseling/discussion   Essential  hypertension   PSVT (paroxysmal supraventricular tachycardia) (HCC)   Chest pain   Diabetes mellitus, type II (HCC)   Tobacco abuse   Cocaine abuse (HCC)   Generalized weakness   Hepatic cirrhosis (HCC)   GERD (gastroesophageal reflux disease)   Leukocytosis   Hypomagnesemia   Uncontrolled type 2 diabetes mellitus with hyperglycemia, with long-term current use of insulin (HCC)   CKD (chronic kidney disease), stage III (HCC)   Abnormal EKG   Cerebellar stroke [facility])   Palliative care by specialist   Advanced care planning/counseling discussion  Time spent: 15 minutes  Kathie Dike, MD [facility] Pager [phone]  If 7PM-7AM,  please contact night-coverage [email or web address] Password [email or web address] 01/08/2018, 2:28 PM    LOS: 16 days

## 2018-01-08 NOTE — Clinical Social Work Note (Signed)
LCSW following. Pt discussed in Progression. Pt has reportedly been impulsive and required a sitter as she was frequently trying to get up from the bed. Pt cannot transition to [facility] until she has been safe without a sitter for at least 24 hours. Updated Gerald Stabs at [facility] Will follow and assist with dc planning.

## 2018-01-08 NOTE — Progress Notes (Signed)
Contacted Lemmie Evens, patient's daughter to request family member presence in patient's room. However, I was informed by patient's daughter that she is working and unable to visit for lengthy periods with her mother. Patient's daughter stated that the patient's sister might be able to sit with mother but she is not in "best of health either." Patient still requiring 1:1 safety sitter at this time.

## 2018-01-09 MED ORDER — LORAZEPAM 2 MG/ML IJ SOLN
1.0000 mg | INTRAMUSCULAR | Status: DC | PRN
  Administered 2018-01-09 (×2): 1 mg via INTRAVENOUS
  Filled 2018-01-09 (×2): qty 1

## 2018-01-09 NOTE — Care Management Note (Signed)
Case Management Note  Patient Details  Name: Cassandra Jordan MRN: [id number] Date of Birth: Feb 21, 1950   If discussed at [place] Length of Stay Meetings, dates discussed:  01/09/18  Additional Comments:  Toshi Ishii, Chauncey Reading, RN 01/09/2018, 12:05 PM

## 2018-01-09 NOTE — Clinical Social Work Note (Signed)
LCSW following. Pt discussed in Progression today. Pt sitter was discontinued at 7AM today. If pt remains safe without a sitter for 24 hours or more, she can dc to [facility]. Per MD, pt medically stable for dc. Updated Gerald Stabs at [facility]. Will follow up in AM.

## 2018-01-09 NOTE — Progress Notes (Signed)
PROGRESS NOTE  Cassandra Jordan  JXB:[id number]  DOB: 08-Jan-1951  DOA: 12/23/2017 PCP: [facility]  Brief Admission Hx: 67 y.o. female chronic cocaine abuser, smoker, diabetic, with known cerebrovascular and heart disease presented to ED with complaints of a frontal headache and acute CVA.    MDM/Assessment & Plan:   1. Acute CVA - MRI reveals multiple acute infarcts in deep white matter and along the left PICA territory.  Pt is on CVA treatment protocol.  Neuro recommends dual antiplatelet therapy which she is currently receiving.  Avoid abrupt and rapid drop in blood pressure. Pt is eating now and able to take medications.    2. 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  We will arrange for close follow-up with vascular surgery.  Neurology has recommended 30 day event monitor and this is been arranged through cardiology.   3. Dyslipidemia - LDL of 181 - continue atorvastatin  4. Prolonged QTc - monitoring on telemetry.  Monitor lytes.  Repeat EKG 12/13 shows persistent QTc prolongation.   Trying to avoid QT prolonging agents.  5. Aspiration pneumonia -she is completed a course of intravenous Unasyn.  SLP evaluated and placed on Dys 2, thin.   6. Acute respiratory distress -resolved now.  Pt briefly required CPAP/BiPAP.  Pulmonology followed.  She is now off nasal cannula and has oxygen saturations in the 90s. 7. COPD - Stable.  No wheezing at this time.  Continue bronchodilators as needed.  8. Acute alcohol / recreational drug withdrawal-resolved. Ativan and morphine have been discontinued. 9. Essential hypertension -blood pressure currently stable.  Continue oral medications.   10. PSVT - cardiology has restarted patient on her home amiodarone and carvedilol. 11. Type 2 DM - CBG Q4h while NPO plus SSI coverage. A1c is 6.7% which is at ADA goal.  12. Cocaine abuse -  Social worker consulted to discuss treatment options with patient.   13. Acute encephalopathy - Improving slowly.  This all seems to be related to her strokes.  She was seen again by neurology who agreed.  Repeat CT did not show any further findings to suggest worsening of cerebrovascular disease.  EEG is normal.   This is likely her new baseline per neurology. Lorazepam ordered as needed.   Unable to give antipsychotics due to persistently prolonged QT interval.   14. Hypernatremia - resolved.  Sodium has improved with hypotonic fluids. 15. Tobacco abuse - nicotine patch ordered.   DVT prophylaxis: lovenox Code Status: Full  Family Communication: Family meeting with sister and niece 12/14.  Family wants to pursue SNF rehab or CIR.    Disposition Plan: SNF placement pending  Consultants:  Neurology  Cardiology  Pulmonology  Antimicrobials:  Unasyn 12/5>12/10  Subjective: Sitter discontinued this morning.  Patient remains confused.  Objective: Vitals:   01/08/18 1300 01/08/18 2141 01/09/18 0557 01/09/18 1358  BP: 136/81 133/73 (!) 152/64 (!) 148/69  Pulse: 90 68 72 70  Resp: 18 18 18 18   Temp: 98.3 F (36.8 C) 98.1 F (36.7 C) 98.1 F (36.7 C) 98.3 F (36.8 C)  TempSrc: Oral Oral Oral Oral  SpO2: 98% 97% 99% 99%  Weight:      Height:        Intake/Output Summary (Last 24 hours) at 01/09/2018 1823 Last data filed at 01/09/2018 1500 Gross per 24 hour  Intake 1270.98 ml  Output 400 ml  Net 870.98 ml  Filed Weights   01/03/18 0600 01/05/18 0500 01/06/18 0500  Weight: 66.4 kg 66.8 kg 64.6 kg   Exam:  General exam: Alert, awake, no distress Respiratory system: Clear to auscultation. Respiratory effort normal. Cardiovascular system:RRR. No murmurs, rubs, gallops. Gastrointestinal system: Abdomen is nondistended, soft and nontender. No organomegaly or masses felt. Normal bowel sounds heard.   Data Reviewed: Basic Metabolic Panel: Recent Labs  Lab 01/03/18 0405  01/04/18 0448 01/08/18 0429  NA 138 138 138  K 3.5 4.0 3.5  CL 107 107 106  CO2 24 24 26   GLUCOSE 125* 111* 75  BUN 12 14 15   CREATININE 1.39* 1.51* 1.48*  CALCIUM 8.6* 8.4* 8.2*  MG 1.7  --   --   PHOS  --  3.2  --    Liver Function Tests: Recent Labs  Lab 01/04/18 0448  ALBUMIN 2.5*   No results for input(s): LIPASE, AMYLASE in the last 168 hours. No results for input(s): AMMONIA in the last 168 hours. CBC: Recent Labs  Lab 01/03/18 0405 01/04/18 0448 01/08/18 0429  WBC 12.6* 12.8* 8.7  NEUTROABS  --  5.1  --   HGB 10.8* 10.7* 9.4*  HCT 35.3* 34.4* 31.1*  MCV 88.5 90.1 89.9  PLT 349 325 250   Cardiac Enzymes: No results for input(s): CKTOTAL, CKMB, CKMBINDEX, TROPONINI in the last 168 hours. CBG (last 3)  No results for input(s): GLUCAP in the last 72 hours. No results found for this or any previous visit (from the past 240 hour(s)).   Studies: No results found. Scheduled Meds: . amiodarone  100 mg Oral Daily  . amLODipine  5 mg Oral Daily  . aspirin  325 mg Oral Daily   Or  . aspirin  300 mg Rectal Daily  . atorvastatin  80 mg Oral q1800  . carvedilol  6.25 mg Oral BID WC  . cloNIDine  0.1 mg Transdermal Weekly  . clopidogrel  75 mg Oral Q breakfast  . enoxaparin (LOVENOX) injection  40 mg Subcutaneous Q24H  . feeding supplement (ENSURE ENLIVE)  237 mL Oral BID BM  . folic acid  1 mg Oral Daily  . Influenza vac split quadrivalent PF  0.5 mL Intramuscular Tomorrow-1000  . mouth rinse  15 mL Mouth Rinse BID  . multivitamin with minerals  1 tablet Oral Daily  . nicotine  21 mg Transdermal Daily  . thiamine  100 mg Oral Daily   Or  . thiamine  100 mg Intravenous Daily   Continuous Infusions: . sodium chloride 10 mL/hr at 01/04/18 0900    Principal Problem:   Acute CVA (cerebrovascular accident) [facility]) Active Problems:   Goals of care, counseling/discussion   Essential hypertension   PSVT (paroxysmal supraventricular tachycardia) (HCC)   Chest  pain   Diabetes mellitus, type II (HCC)   Tobacco abuse   Cocaine abuse (HCC)   Generalized weakness   Hepatic cirrhosis (HCC)   GERD (gastroesophageal reflux disease)   Leukocytosis   Hypomagnesemia   Uncontrolled type 2 diabetes mellitus with hyperglycemia, with long-term current use of insulin (HCC)   CKD (chronic kidney disease), stage III (HCC)   Abnormal EKG   Cerebellar stroke [facility])   Palliative care by specialist   Advanced care planning/counseling discussion  Time spent: 15 minutes  Kathie Dike, MD [facility] Pager [phone]  If 7PM-7AM, please contact night-coverage [email or web address] Password [email or web address] 01/09/2018, 6:23 PM    LOS: 17 days

## 2018-01-09 NOTE — Progress Notes (Signed)
Physical Therapy Treatment Patient Details Name: Cassandra Jordan MRN: [id number] DOB: [birth year] Today's Date: 01/09/2018    History of Present Illness Cassandra Jordan is a 67 y.o. female chronic cocaine abuser, smoker, diabetic, with known cerebrovascular and heart disease presented to ED with complaints of a frontal headache that started this morning.  The patient has since resolved but she initially describe pain as a dull headache 9/10 associtated with mild nausea and photophobia.  She also reported dull 8/10 chest pain and pressure described as achy dull pain in upper sternal area.  She was given nitroglycerin in ED and now she says she does not have chest pain.  She says she last used cocaine yesterday evening around 7pm.  She denies shortness of breath.  She has a strong family history of heart disease and premature cardiac death in her father that does in 35s of heart disease.     PT Comments    Therapist arrived with RN assisting pt from restroom back to bed.  Pt with ataxic gait mechanics noted when ambulating with HHA.  RN informed therapist pt just given Ativan with noted increased difficulty following commands.  Pt required assistance with seated balance with tendency to lean to Lt, also continuously crossed Rt leg over Lt and would kick at therapist while attempted instructions for scooting or standing for sidestep towards head of bed.  Min A with RN to scoot toward [place].  RN in room at [place].  No further gait training attempted due to recent dose of Atavin   Follow Up Recommendations        Equipment Recommendations       Recommendations for Other Services       Precautions / Restrictions Precautions Precautions: Fall    Mobility  Bed Mobility                  Transfers                 General transfer comment: RN walking pt. back to bed from bathroom with hand held assistance, ataxic gait mechanics noted.  Assisted to sitting on side of bed with assistance  for seated balance, tendency to lean to Lt.;  Scooting towards [facility] with multimodal cueing to follow task.    Ambulation/Gait Ambulation/Gait assistance: Min assist Gait Distance (Feet): 15 Feet Assistive device: 1 person hand held assist(Recommend RW wiht gait due ataxic gait mechanics) Gait Pattern/deviations: Decreased step length - right;Decreased step length - left;Decreased stance time - left Gait velocity: slow   General Gait Details: ataxic gait mechanics when ambulating with HHA   Stairs             Wheelchair Mobility    Modified Rankin (Stroke Patients Only)       Balance                                            Cognition Arousal/Alertness: Awake/alert Behavior During Therapy: Restless;Impulsive Overall Cognitive Status: Impaired/Different from baseline Area of Impairment: Following commands;Attention                       Following Commands: Follows one step commands with increased time       General Comments: RN reports she was just given Ativan, difficulty following commands and attention      Exercises  General Comments        Pertinent Vitals/Pain Pain Assessment: No/denies pain    Home Living                      Prior Function            PT Goals (current goals can now be found in the care plan section)      Frequency    Min 5X/week      PT Plan Current plan remains appropriate    Co-evaluation              AM-PAC PT "6 Clicks" Mobility   Outcome Measure  Help needed turning from your back to your side while in a flat bed without using bedrails?: Total Help needed moving from lying on your back to sitting on the side of a flat bed without using bedrails?: Total Help needed moving to and from a bed to a chair (including a wheelchair)?: Total Help needed standing up from a chair using your arms (e.g., wheelchair or bedside chair)?: A Little Help needed to walk in hospital  room?: A Lot Help needed climbing 3-5 steps with a railing? : Total 6 Click Score: 9    End of Session   Activity Tolerance: Patient tolerated treatment well;Patient limited by fatigue Patient left: in bed;with restraints reapplied;with call bell/phone within reach;with bed alarm set;with nursing/sitter in room Nurse Communication: Mobility status PT Visit Diagnosis: Unsteadiness on feet (R26.81);Other abnormalities of gait and mobility (R26.89);Muscle weakness (generalized) (M62.81)     Time: 7741-4239 PT Time Calculation (min) (ACUTE ONLY): 15 min  Charges:  $Therapeutic Activity: 8-22 mins                     [address], LPTA; CBIS [phone]  Aldona Lento 01/09/2018, 3:50 PM

## 2018-01-09 NOTE — Progress Notes (Signed)
Nutrition Follow-up  DOCUMENTATION CODES:  Not applicable  INTERVENTION:  Ensure Enlive po BID, each supplement provides 350 kcal and 20 grams of protein  Vital Shake with meals, each supplement provides 480-500 kcals and 20-23 grams of protein  NUTRITION DIAGNOSIS:  Inadequate oral intake related to lethargy/confusion as evidenced by per staff report.  GOAL:  Patient will meet greater than or equal to 90% of their needs  MONITOR:  Diet advancement, Weight trends, Labs, I & O's, Skin  ASSESSMENT:  67 y/o female Pmhx chronic cocaine, tobacco and etoh use,  DM2, CAD, Hep C, Noncompliance, HLD, HTN. Presented to ED w/ complaints of HA, nausea, photophobia and chest pain. CT showed acute/subacute CVA. Admitted for management. MRI later revealed multiple acute infarcts  Weekly follow up. Since last seen, pt has been transferred out of ICU. She continues to have intermittent periods of lucidity, but largely stays altered. Per Progression rounds, pt has finished PNA tx and is medically ready for D/C to SNF, but pt is currently requiring a sitter d/t persistent attemps to get out of bed; this makes her ineligible for SNF transfer  Meal documentation over the past week suggests pt is completing 25-75% of her meals. However, there is a meals where she only ate a couple bites. Similarly, her NT reports today pt refused to eat her lunch. She has been on a D2 diet w/ TL since last seen on 12/12  RD attempted to elicit from pt why she wouldn't eat. She says "ill eat when I get dressed". She still displays significant confusion and does not know where she is.   RD listed several foods and asked which ones sounded good to her. Will try to add these to meals. Unfortunately, she is on a D2 diet which limits what she is safe to eat.   Supplement wise, she appears to have accepted 5 out of the last 8 ensure administrations. She is noted to have refused both today, but consumed the 4 prior.    Wt wise,  bed weight today is 59.2 kg or 130 lbs. This is a loss of 19.5 lbs since she was seen last week. However much of this loss was expected, as she was 143.3 lbs when admitted on 12/2 and her wt this year, prior to admission, had been stable at 135-145 lbs. Since admission, she has lost 9% bw x 2 weeks.   Will try Vital shakes with meals, which are most energy dense supplement on formulary.   Apprised MD of the pts degree of wt loss and her suspected inability to meet energy needs w/ Oral intake alone  Labs: Bgs: 105-115, Creat: 1.48 (stable)  Meds: Folate, insulin, MVI, thiamin, IVF  Recent Labs  Lab 01/03/18 0405 01/04/18 0448 01/08/18 0429  NA 138 138 138  K 3.5 4.0 3.5  CL 107 107 106  CO2 24 24 26   BUN 12 14 15   CREATININE 1.39* 1.51* 1.48*  CALCIUM 8.6* 8.4* 8.2*  MG 1.7  --   --   PHOS  --  3.2  --   GLUCOSE 125* 111* 75    Diet Order:   Diet Order            DIET DYS 2 Room service appropriate? Yes; Fluid consistency: Thin  Diet effective now             EDUCATION NEEDS:  No education needs have been identified at this time  Skin:   MASD to groin Abrasion L knee  Last BM:  12/16  Height:  Ht Readings from Last 1 Encounters:  12/23/17 5' (1.524 m)   Weight:  Wt Readings from Last 1 Encounters:  01/06/18 64.6 kg   Wt Readings from Last 10 Encounters:  01/06/18 64.6 kg  12/11/17 66.2 kg  10/29/17 63 kg  09/05/17 63.5 kg  09/05/17 63.5 kg  05/31/17 65.8 kg  05/22/17 62.9 kg  02/08/17 62.6 kg  01/31/17 62.4 kg  11/18/16 63.8 kg   Ideal Body Weight:  45.45 kg  BMI:  Body mass index is 27.81 kg/m.  Estimated Nutritional Needs:  Kcal:  1550-1750 kcals (23-26 kcal/kg bw) Protein:  75-88g pro (~20% energy needs) Fluid:  1.6-1.8 L fluid (1 ml/kcal)  Burtis Junes RD, LDN, CNSC Clinical Nutrition Available Tues-Sat via Pager: [phone] 01/09/2018 2:36 PM

## 2018-01-10 MED ORDER — LORAZEPAM 1 MG PO TABS: 1.0000 mg | ORAL_TABLET | ORAL | 0 refills | Status: DC | PRN

## 2018-01-10 MED ORDER — CLONIDINE 0.1 MG/24HR TD PTWK: 0.1000 mg | MEDICATED_PATCH | TRANSDERMAL | 12 refills | Status: DC

## 2018-01-10 MED ORDER — CLOPIDOGREL BISULFATE 75 MG PO TABS: 75.0000 mg | ORAL_TABLET | Freq: Every day | ORAL | Status: AC

## 2018-01-10 MED ORDER — ATORVASTATIN CALCIUM 80 MG PO TABS: 80.0000 mg | ORAL_TABLET | Freq: Every day | ORAL | Status: DC

## 2018-01-10 MED ORDER — ASPIRIN 325 MG PO TABS: 325.0000 mg | ORAL_TABLET | Freq: Every day | ORAL | Status: AC

## 2018-01-10 NOTE — Discharge Summary (Signed)
Physician Discharge Summary  Cassandra Jordan TLX:[id number] DOB: 06-20-1950 DOA: 12/23/2017  PCP: [facility] date: 12/23/2017 Discharge date: 01/10/2018  Admitted From: Home Disposition: Skilled nursing facility  Recommendations for Outpatient Follow-up:  1. Follow up with PCP in 1-2 weeks 2. Please obtain BMP/CBC in one week 3. Arrangements have been made for patient to have 30-day event monitor 4. Referral has been made to vascular surgery to discuss treatment options for right carotid stenosis  Discharge Condition: Stable CODE STATUS: Full code Diet recommendation: Dysphagia 2 diet with thin liquids  Brief/Interim Summary: 67 year old female with a history of diabetes, hypertension, substance abuse, tobacco use, with previous history of stroke and heart disease, was admitted to the hospital with complaints of frontal headache.  She was found to have acute CVA.  Discharge Diagnoses:  Principal Problem:   Acute CVA (cerebrovascular accident) ([place]) Active Problems:   Goals of care, counseling/discussion   Essential hypertension   PSVT (paroxysmal supraventricular tachycardia) (HCC)   Chest pain   Diabetes mellitus, type II (HCC)   Tobacco abuse   Cocaine abuse (HCC)   Generalized weakness   Hepatic cirrhosis (HCC)   GERD (gastroesophageal reflux disease)   Leukocytosis   Hypomagnesemia   Uncontrolled type 2 diabetes mellitus with hyperglycemia, with long-term current use of insulin (HCC)   CKD (chronic kidney disease), stage III (HCC)   Abnormal EKG   Cerebellar stroke [facility])   Palliative care by specialist   Advanced care planning/counseling discussion  1. Acute CVA.  Initial CT head done in the emergency room indicated acute/subacute infarct.  Patient did not have any clear neurologic symptoms, only complaint of headache.  MRI revealed multiple acute infarcts in the deep white matter and along the left PICA territory.  Patient was placed  on CVA treatment protocol.  Neurology recommended dual antiplatelet therapy.  She is currently on aspirin and Plavix.  Blood pressure has been stable.  She is on statin.  She has been seen by speech therapy and physical therapy who recommended skilled nursing facility placement.  Echocardiogram showed EF of 45 to 50% 2. 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  Discussed with Dr. Merlene Laughter with recommendations for outpatient vascular surgery follow-up.  With her new baseline mental status, she may not be a candidate for aggressive surgical management.  It was felt that this could be appropriately followed up as an outpatient.  Referral to vascular surgery has been made. 3. Hyperlipidemia.  Lipitor was increased to 80 mg daily. 4. Prolonged QT interval.  Avoid QT prolonging agents. 5. Aspiration pneumonia.  Patient developed shortness of breath and cough.  She was treated with a course of intravenous Unasyn.  She is not having any further fevers or cough at this time.  She was seen by speech therapy and put on a modified diet. 6. Acute respiratory distress.  She briefly required CPAP/BiPAP.  She was seen by pulmonology and treated with bronchodilators and antibiotics.  Oxygen saturations has improved and she is now been weaned off of oxygen.  She is breathing comfortably on room air. 7. COPD.  Stable at this time.  No evidence of wheezing.  Continue bronchodilators as needed. 8. Acute alcohol/recreational drug withdrawal.  Patient developed significant withdrawal shortly after admission.  She was treated with intravenous Ativan.  Her withdrawal symptoms appear to have resolved. 9. Acute encephalopathy.  Multifactorial.  Initially, it was felt that her pneumonia/alcohol withdrawal may be contributing to  her delirium.  She was followed up by neurology where EEG was unremarkable.  CT head was repeated when her delirium persisted and did not show any new findings.   Her overall mental status has improved.  She does remain confused which is likely her new baseline.  It was felt that she will likely develop some degree of vascular dementia and her cognitive deficits are likely her new baseline at this point. 10. Diabetes.  She was placed on sliding scale insulin.  A1c was 6.7. 11. Hypertension.  On multiple medications.  Blood pressures currently stable. 12. Cocaine abuse.  Seen by social work.   Discharge Instructions  Discharge Instructions    Ambulatory referral to Vascular Surgery   Complete by:  As directed    Right sided carotid stenosis, recent stroke   Diet - low sodium heart healthy   Complete by:  As directed    Increase activity slowly   Complete by:  As directed      Allergies as of 01/10/2018      Reactions   Sulfa Antibiotics Itching, Other (See Comments)   burning      Medication List    STOP taking these medications   aspirin 81 MG EC tablet Replaced by:  aspirin 325 MG tablet   cloNIDine 0.2 MG tablet Commonly known as:  CATAPRES   furosemide 20 MG tablet Commonly known as:  LASIX   gabapentin 600 MG tablet Commonly known as:  NEURONTIN   LANTUS 100 UNIT/ML injection Generic drug:  insulin glargine   lisinopril-hydrochlorothiazide 20-25 MG tablet Commonly known as:  PRINZIDE,ZESTORETIC   pantoprazole 40 MG tablet Commonly known as:  PROTONIX   potassium chloride 10 MEQ tablet Commonly known as:  K-DUR     TAKE these medications   amiodarone 100 MG tablet Commonly known as:  PACERONE Take 1 tablet (100 mg total) by mouth daily. TAKE ONE TABLET BY MOUTH ONCE DAILY   amLODipine 10 MG tablet Commonly known as:  NORVASC Take 1 tablet (10 mg total) by mouth daily.   aspirin 325 MG tablet Take 1 tablet (325 mg total) by mouth daily. Start taking on:  January 11, 2018 Replaces:  aspirin 81 MG EC tablet   atorvastatin 80 MG tablet Commonly known as:  LIPITOR Take 1 tablet (80 mg total) by mouth daily at  6 PM. What changed:    medication strength  how much to take   carvedilol 6.25 MG tablet Commonly known as:  COREG Take 6.25 mg by mouth 2 (two) times daily.   cloNIDine 0.1 mg/24hr patch Commonly known as:  CATAPRES - Dosed in mg/24 hr Place 1 patch (0.1 mg total) onto the skin once a week. Start taking on:  January 16, 2018   clopidogrel 75 MG tablet Commonly known as:  PLAVIX Take 1 tablet (75 mg total) by mouth daily with breakfast. Start taking on:  January 11, 2018   LORazepam 1 MG tablet Commonly known as:  ATIVAN Take 1 tablet (1 mg total) by mouth every 4 (four) hours as needed for anxiety, sedation or sleep.      Contact information for after-discharge care    [facility] .   Service:  Skilled Nursing Contact information: [address] [address] [phone]             Allergies  Allergen Reactions  . Sulfa Antibiotics Itching and Other (See Comments)    burning  Consultations:  Neurology  Cardiology  Pulmonology  Palliative care   Procedures/Studies: Dg Chest 2 View  Result Date: 12/23/2017 CLINICAL DATA:  Chest pain this morning. EXAM: CHEST - 2 VIEW COMPARISON:  10/29/2017 FINDINGS: Lungs are adequately inflated without consolidation or effusion. Possible minimal vascular congestion. Cardiomediastinal silhouette and remainder of the exam is unchanged. IMPRESSION: Possible minimal vascular congestion. Electronically Signed   By: Marin Olp M.D.   On: 12/23/2017 08:31   Ct Head Wo Contrast  Result Date: 01/01/2018 CLINICAL DATA:  67 y/o F; sudden change in mental status. History of stroke. EXAM: CT HEAD WITHOUT CONTRAST TECHNIQUE: Contiguous axial images were obtained from the base of the skull through the vertex without intravenous contrast. COMPARISON:  12/23/2017 MRI head. 12/23/2017 CT head. FINDINGS: Brain: Motion degraded study. Multiple foci of hypoattenuation  within the right frontal white matter corresponding to acute infarctions on the prior MRI of the brain grossly stable in distribution. Stable distribution of left inferior cerebellar hemisphere infarction. No interval hemorrhage or change in mass effect identified. No new stroke, extra-axial collection, hydrocephalus, or herniation. Stable background of chronic microvascular ischemic changes and volume loss of the brain. Vascular: Calcific atherosclerosis of carotid siphons. No hyperdense vessel identified. Skull: Normal. Negative for fracture or focal lesion. Sinuses/Orbits: Small right maxillary sinus fluid level. Normal aeration of additional visible paranasal sinuses and the mastoid air cells. Bilateral intra-ocular lens replacement. Other: None. IMPRESSION: 1. Motion artifact. 2. Stable distribution of white matter infarcts in the right frontal lobe and infarct within the left inferior cerebellar hemisphere. No interval hemorrhage or change in mass effect identified on this motion degraded study. 3. Stable chronic microvascular ischemic changes and volume loss of the brain. Electronically Signed   By: Kristine Garbe M.D.   On: 01/01/2018 23:09   Ct Head Wo Contrast  Result Date: 12/23/2017 CLINICAL DATA:  67 year old female with facial pain across forehead and nasal region. Chest pain. Rectal cancer. Diabetes and hypertension. Paroxysmal ventricular tachycardia. Initial encounter. EXAM: CT HEAD WITHOUT CONTRAST TECHNIQUE: Contiguous axial images were obtained from the base of the skull through the vertex without intravenous contrast. COMPARISON:  05/22/2017 brain MR. 05/21/2017 head CT. FINDINGS: Brain: Moderate size acute/subacute nonhemorrhagic left mid to inferior cerebellar infarct suspected. Three new rounded hypodensities within the right corona radiata. Findings may reflect result of interval infarcts given patient's history of multiple prior infarcts however, with history of rectal cancer,  intracranial metastatic disease cannot be excluded. Moderate-to-marked chronic microvascular changes. Global atrophy. No intracranial hemorrhage. Vascular: Vascular calcifications.  No acute hyperdense vessel. Skull: No acute abnormality. Sinuses/Orbits: Visualized orbital structures reveal prior lens replacement without acute abnormality detected. Minimal mucosal thickening right sphenoid sinuses and ethmoid sinus air cells. Other: Visualized mastoid air cells and middle ear cavities are clear. IMPRESSION: 1. Moderate size acute/subacute nonhemorrhagic left mid to inferior cerebellar infarct suspected. 2. Three new rounded hypodensities within the right corona radiata. Findings may reflect result of interval infarcts given patient's history of multiple prior infarcts however, with history of rectal cancer, intracranial metastatic disease cannot be excluded. Contrast-enhanced MR would prove helpful for further delineation if clinically desired. 3. Moderate-to-marked chronic microvascular changes. Global atrophy. These results were called by telephone at the time of interpretation on 12/23/2017 at 8:57 am to Dr. [name] , who verbally acknowledged these results. Electronically Signed   By: Genia Del M.D.   On: 12/23/2017 08:58   Mr Jodene Nam Head Wo Contrast  Result Date: 12/23/2017 CLINICAL DATA:  facial  pain. Diabetes and hypertension. Abnormal head CT EXAM: MRI HEAD WITHOUT AND WITH CONTRAST MRA HEAD WITHOUT CONTRAST TECHNIQUE: Multiplanar, multiecho pulse sequences of the brain and surrounding structures were obtained without and with intravenous contrast. Angiographic images of the head were obtained using MRA technique without contrast. CONTRAST:  6 cc Gadavist COMPARISON:  CT same day.  MRI 05/22/2017 FINDINGS: MRI HEAD FINDINGS Brain: Diffusion imaging shows a 4 cm region of acute infarction at the inferior cerebellum on the left consistent with left posterior inferior cerebellar artery branch vessel  occlusion. Mild swelling but no hemorrhage or mass effect. Additionally, there are multiple clustered foci of acute infarction within the deep white matter on the right within the radiating white matter tracts most consistent with hypoperfusion or watershed infarctions. Likewise, there is mild swelling but no hemorrhage or mass effect. There are no acute cortically based infarctions affecting the supratentorial brain. Background pattern of chronic small vessel ischemic change affects the pons, basal ganglia and the cerebral hemispheric white matter. No hydrocephalus. No extra-axial collection. No sign of mass lesion. After contrast administration, no abnormal enhancement occurs. Vascular: Major vessels at the base of the brain show flow. See below. Skull and upper cervical spine: Negative Sinuses/Orbits: Ordinary mucosal inflammatory changes of the maxillary sinuses. Orbits negative. Other: None MRA HEAD FINDINGS Both internal carotid arteries are patent through the skull base. On the left, the anterior and middle cerebral vessels show flow, similar to the previous study. On the right, there is a new severe stenosis/subtotal occlusion of the supraclinoid ICA/proximal MCA. Left vertebral artery remains patent to the basilar. Flow is visible within the left posterior inferior cerebellar artery. There is motion degradation on today's exam which precludes more distal evaluation. The right vertebral artery is a disease vessel which does not show flow to the basilar on today's study. This could be in part technical. Atherosclerotic irregularity of the basilar artery. Distal posterior circulation branch vessels are poorly seen. IMPRESSION: Acute infarction within the left posterior inferior cerebellar artery territory. Mild swelling but no hemorrhage or mass effect. Flow persists within the left vertebral artery and at least to some extent within the left posterior inferior cerebellar artery. Multiple acute infarctions  within the deep white matter/radiating white matter tracts of the right cerebral hemisphere, most consistent with watershed or hypoperfusion infarctions. Mild swelling but no hemorrhage or mass effect. 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 The intracranial MR angiogram is motion degraded compared to previous studies. Both anterior cerebral arteries receive there supply from the left carotid circulation. No change seen on the left in the anterior circulation. The right supraclinoid ICA/proximal MCA stenosis is new. These results were called by telephone at the time of interpretation on 12/23/2017 at 10:15 am to Dr. [name] , who verbally acknowledged these results. Electronically Signed   By: Nelson Chimes M.D.   On: 12/23/2017 10:15   Mr Brain W And Wo Contrast  Result Date: 12/23/2017 CLINICAL DATA:  facial pain. Diabetes and hypertension. Abnormal head CT EXAM: MRI HEAD WITHOUT AND WITH CONTRAST MRA HEAD WITHOUT CONTRAST TECHNIQUE: Multiplanar, multiecho pulse sequences of the brain and surrounding structures were obtained without and with intravenous contrast. Angiographic images of the head were obtained using MRA technique without contrast. CONTRAST:  6 cc Gadavist COMPARISON:  CT same day.  MRI 05/22/2017 FINDINGS: MRI HEAD FINDINGS Brain: Diffusion imaging shows a 4 cm region of acute infarction at the inferior cerebellum on the left  consistent with left posterior inferior cerebellar artery branch vessel occlusion. Mild swelling but no hemorrhage or mass effect. Additionally, there are multiple clustered foci of acute infarction within the deep white matter on the right within the radiating white matter tracts most consistent with hypoperfusion or watershed infarctions. Likewise, there is mild swelling but no hemorrhage or mass effect. There are no acute cortically based infarctions affecting the supratentorial brain. Background pattern of  chronic small vessel ischemic change affects the pons, basal ganglia and the cerebral hemispheric white matter. No hydrocephalus. No extra-axial collection. No sign of mass lesion. After contrast administration, no abnormal enhancement occurs. Vascular: Major vessels at the base of the brain show flow. See below. Skull and upper cervical spine: Negative Sinuses/Orbits: Ordinary mucosal inflammatory changes of the maxillary sinuses. Orbits negative. Other: None MRA HEAD FINDINGS Both internal carotid arteries are patent through the skull base. On the left, the anterior and middle cerebral vessels show flow, similar to the previous study. On the right, there is a new severe stenosis/subtotal occlusion of the supraclinoid ICA/proximal MCA. Left vertebral artery remains patent to the basilar. Flow is visible within the left posterior inferior cerebellar artery. There is motion degradation on today's exam which precludes more distal evaluation. The right vertebral artery is a disease vessel which does not show flow to the basilar on today's study. This could be in part technical. Atherosclerotic irregularity of the basilar artery. Distal posterior circulation branch vessels are poorly seen. IMPRESSION: Acute infarction within the left posterior inferior cerebellar artery territory. Mild swelling but no hemorrhage or mass effect. Flow persists within the left vertebral artery and at least to some extent within the left posterior inferior cerebellar artery. Multiple acute infarctions within the deep white matter/radiating white matter tracts of the right cerebral hemisphere, most consistent with watershed or hypoperfusion infarctions. Mild swelling but no hemorrhage or mass effect. 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 The intracranial MR angiogram is motion degraded compared to previous studies. Both anterior cerebral arteries receive there supply from the left  carotid circulation. No change seen on the left in the anterior circulation. The right supraclinoid ICA/proximal MCA stenosis is new. These results were called by telephone at the time of interpretation on 12/23/2017 at 10:15 am to Dr. [name] , who verbally acknowledged these results. Electronically Signed   By: Nelson Chimes M.D.   On: 12/23/2017 10:15   US Carotid Bilateral (at [facility] Only)  Result Date: 12/23/2017 CLINICAL DATA:  Stroke.  History of heart murmur. EXAM: BILATERAL CAROTID DUPLEX ULTRASOUND TECHNIQUE: Pearline Cables scale imaging, color Doppler and duplex ultrasound were performed of bilateral carotid and vertebral arteries in the neck. COMPARISON:  None. FINDINGS: Criteria: Quantification of carotid stenosis is based on velocity parameters that correlate the residual internal carotid diameter with NASCET-based stenosis levels, using the diameter of the distal internal carotid lumen as the denominator for stenosis measurement. The following velocity measurements were obtained: RIGHT ICA:  203/66 cm/sec CCA:  45/85 cm/sec SYSTOLIC ICA/CCA RATIO:  3.8 ECA:  200 cm/sec LEFT ICA:  94/30 cm/sec CCA:  92/92 cm/sec SYSTOLIC ICA/CCA RATIO:  1.2 ECA:  65 cm/sec RIGHT CAROTID ARTERY: There is a large amount of eccentric echogenic plaque within the right carotid bulb (image 24 and 27), which results in elevated peak systolic velocities within the proximal aspect of the right internal carotid artery. Greatest acquired peak systolic velocity within the proximal right ICA measures 203 centimeters/second (image  4). RIGHT VERTEBRAL ARTERY:  Antegrade Flow LEFT CAROTID ARTERY: There is a moderate amount of eccentric mixed echogenic plaque within the left carotid bulb (image 71 and 74), not resulting in elevated peak systolic velocities within the interrogated course of the left internal carotid artery to suggest a hemodynamically significant stenosis. LEFT VERTEBRAL ARTERY:  Antegrade Flow IMPRESSION: 1. Large  amount of right-sided atherosclerotic plaque results in elevated peak systolic velocities within the right internal carotid artery compatible with the higher end of the 50-69% luminal narrowing range. Further evaluation with CTA could performed as clinically indicated. 2. Moderate amount of left-sided atherosclerotic plaque, not resulting in a hemodynamically significant stenosis. Electronically Signed   By: Sandi Mariscal M.D.   On: 12/23/2017 15:06   Dg Chest Port 1 View  Result Date: 12/28/2017 CLINICAL DATA:  67 year old female with respiratory failure EXAM: PORTABLE CHEST 1 VIEW COMPARISON:  Prior chest x-ray 12/27/2017 FINDINGS: Stable cardiac and mediastinal contours. Atherosclerotic calcifications again noted throughout the transverse aorta. Increasing pulmonary vascular congestion with slightly progressed pulmonary interstitial edema. No evidence of pneumothorax. No acute osseous abnormality. IMPRESSION: Slightly increased pulmonary edema concerning for worsening CHF. Aortic Atherosclerosis (ICD10-170.0) Electronically Signed   By: Jacqulynn Cadet M.D.   On: 12/28/2017 09:15   Dg Chest Port 1 View  Result Date: 12/27/2017 CLINICAL DATA:  History of aspiration EXAM: PORTABLE CHEST 1 VIEW COMPARISON:  12/26/2017 FINDINGS: Cardiac shadow is stable. The lungs are well aerated bilaterally. Increasing central vascular congestion is noted. No sizable infiltrate or effusion is noted. No bony abnormality is seen. IMPRESSION: Increasing central vascular markings. Electronically Signed   By: Inez Catalina M.D.   On: 12/27/2017 06:36   Dg Chest Port 1 View  Result Date: 12/26/2017 CLINICAL DATA:  Aspiration. EXAM: PORTABLE CHEST 1 VIEW COMPARISON:  12/23/2017. FINDINGS: Heart size stable given AP technique. No pulmonary venous congestion. Low lung volumes. Bibasilar pulmonary infiltrates/edema. Tiny bilateral pleural effusions cannot be excluded. No pneumothorax. No acute bony abnormality. IMPRESSION: Low  lung volumes with bibasilar infiltrates/edema. Tiny pleural effusions cannot be excluded. Electronically Signed   By: Marcello Moores  Register   On: 12/26/2017 07:47       Subjective: No new complaints today.  Currently, patient is calm.  Discharge Exam: Vitals:   01/08/18 2141 01/09/18 0557 01/09/18 1358 01/10/18 0035  BP: 133/73 (!) 152/64 (!) 148/69 (!) 164/98  Pulse: 68 72 70 75  Resp: 18 18 18 20   Temp:  98.1 F (36.7 C) 98.3 F (36.8 C) 98.2 F (36.8 C)  TempSrc: Oral Oral Oral Oral  SpO2: 97% 99% 99% 100%  Weight:      Height:        General: Pt is alert, awake, not in acute distress Cardiovascular: RRR, S1/S2 +, no rubs, no gallops Respiratory: CTA bilaterally, no wheezing, no rhonchi Abdominal: Soft, NT, ND, bowel sounds + Extremities: no edema, no cyanosis    The results of significant diagnostics from this hospitalization (including imaging, microbiology, ancillary and laboratory) are listed below for reference.     Microbiology: No results found for this or any previous visit (from the past 240 hour(s)).   Labs: BNP (last 3 results) No results for input(s): BNP in the last 8760 hours. Basic Metabolic Panel: Recent Labs  Lab 01/04/18 0448 01/08/18 0429  NA 138 138  K 4.0 3.5  CL 107 106  CO2 24 26  GLUCOSE 111* 75  BUN 14 15  CREATININE 1.51* 1.48*  CALCIUM 8.4* 8.2*  PHOS  3.2  --    Liver Function Tests: Recent Labs  Lab 01/04/18 0448  ALBUMIN 2.5*   No results for input(s): LIPASE, AMYLASE in the last 168 hours. No results for input(s): AMMONIA in the last 168 hours. CBC: Recent Labs  Lab 01/04/18 0448 01/08/18 0429  WBC 12.8* 8.7  NEUTROABS 5.1  --   HGB 10.7* 9.4*  HCT 34.4* 31.1*  MCV 90.1 89.9  PLT 325 250   Cardiac Enzymes: No results for input(s): CKTOTAL, CKMB, CKMBINDEX, TROPONINI in the last 168 hours. BNP: Invalid input(s): POCBNP CBG: Recent Labs  Lab 01/04/18 1652 01/04/18 2133 01/05/18 0736 01/05/18 1131  01/05/18 1604  GLUCAP 108* 122* 114* 102* 106*   D-Dimer No results for input(s): DDIMER in the last 72 hours. Hgb A1c No results for input(s): HGBA1C in the last 72 hours. Lipid Profile No results for input(s): CHOL, HDL, LDLCALC, TRIG, CHOLHDL, LDLDIRECT in the last 72 hours. Thyroid function studies No results for input(s): TSH, T4TOTAL, T3FREE, THYROIDAB in the last 72 hours.  Invalid input(s): FREET3 Anemia work up No results for input(s): VITAMINB12, FOLATE, FERRITIN, TIBC, IRON, RETICCTPCT in the last 72 hours. Urinalysis    Component Value Date/Time   COLORURINE YELLOW 01/08/2018 1927   APPEARANCEUR CLEAR 01/08/2018 1927   LABSPEC 1.008 01/08/2018 1927   PHURINE 8.0 01/08/2018 1927   GLUCOSEU NEGATIVE 01/08/2018 1927   HGBUR SMALL (A) 01/08/2018 1927   BILIRUBINUR NEGATIVE 01/08/2018 [place] NEGATIVE 01/08/2018 1927   PROTEINUR 100 (A) 01/08/2018 1927   UROBILINOGEN 0.2 06/30/2014 2000   NITRITE NEGATIVE 01/08/2018 1927   LEUKOCYTESUR TRACE (A) 01/08/2018 1927   Sepsis Labs Invalid input(s): PROCALCITONIN,  WBC,  LACTICIDVEN Microbiology No results found for this or any previous visit (from the past 240 hour(s)).   Time coordinating discharge: 32mns  SIGNED:   JKathie Dike MD  [facility] 01/10/2018, 11:24 AM Pager   If 7PM-7AM, please contact night-coverage [email or web address] Password [email or web address]

## 2018-01-10 NOTE — Progress Notes (Signed)
Patient discharged to [facility] called and given to [facility] LPN. Transported via EMS to awaiting facility. Vital signs stable.

## 2018-01-10 NOTE — Care Management Important Message (Signed)
Important Message  Patient Details  Name: Cassandra Jordan MRN: [id number] Date of Birth: 01-May-1950   Medicare Important Message Given:  Yes    Shelda Altes 01/10/2018, 2:20 PM

## 2018-01-10 NOTE — Clinical Social Work Placement (Signed)
   CLINICAL SOCIAL WORK PLACEMENT  NOTE  Date:  01/10/2018  Patient Details  Name: Cassandra Jordan MRN: [id number] Date of Birth: Aug 11, 1950  Clinical Social Work is seeking post-discharge placement for this patient at the [place] level of care (*CSW will initial, date and re-position this form in  chart as items are completed):  Yes   Patient/family provided with [facility] Work Department's list of facilities offering this level of care within the geographic area requested by the patient (or if unable, by the patient's family).  Yes   Patient/family informed of their freedom to choose among providers that offer the needed level of care, that participate in Medicare, Medicaid or managed care program needed by the patient, have an available bed and are willing to accept the patient.  Yes   Patient/family informed of [facility]'s ownership interest in [facility] and [facility], as well as of the fact that they are under no obligation to receive care at these facilities.  PASRR submitted to EDS on       PASRR number received on       Existing PASRR number confirmed on       FL2 transmitted to all facilities in geographic area requested by pt/family on 01/03/18     FL2 transmitted to all facilities within larger geographic area on       Patient informed that his/her managed care company has contracts with or will negotiate with certain facilities, including the following:        Yes   Patient/family informed of bed offers received.  Patient chooses bed at [facility]     Physician recommends and patient chooses bed at      Patient to be transferred to [facility] on 01/10/18.  Patient to be transferred to facility by EMS     Patient family notified on 01/10/18 of transfer.  Name of family member notified:  Josie Dixon     PHYSICIAN       Additional Comment: Pt stable for dc today per MD. Updated Gerald Stabs at [facility] Spoke with pt's daughter by phone to notify. She is in agreement with the plan. EMS to transport. DC clinical will be sent electronically once it is complete. RN updated. She will call report. No other CSW needs for dc.   _______________________________________________ Shade Flood, LCSW 01/10/2018, 11:33 AM

## 2018-01-10 NOTE — NC FL2 (Signed)
[place] MEDICAID FL2 LEVEL OF CARE SCREENING TOOL     IDENTIFICATION  Patient Name: Cassandra Jordan Birthdate: 14-Sep-1950 Sex: female Admission Date (Current Location): 12/23/2017  [facility] and [place] Number:  [employer] and Address:  [facility] [address], [place]      Provider Number: [id number]  Attending Physician Name and Address:  Kathie Dike, MD  Relative Name and Phone Number:  Lemmie Evens (dtr) [phone]    Current Level of Care: Hospital Recommended Level of Care: [place] Prior Approval Number:    Date Approved/Denied:   PASRR Number: [id number]  Discharge Plan: SNF    Current Diagnoses: Patient Active Problem List   Diagnosis Date Noted  . Advanced care planning/counseling discussion   . Palliative care by specialist   . Cerebellar stroke ([place])   . Abnormal EKG 12/23/2017  . Hyperphosphatemia 05/22/2017  . Acute CVA (cerebrovascular accident) ([place]) 05/21/2017  . Abdominal pain 11/18/2016  . Cholelithiasis 11/18/2016  . Uncontrolled type 2 diabetes mellitus with hyperglycemia, with long-term current use of insulin ([place]) 11/18/2016  . CKD (chronic kidney disease), stage III ([place]) 11/18/2016  . Acute pancreatitis   . Ileus ([place])   . Porcelain gallbladder   . Acute renal failure superimposed on stage 3 chronic kidney disease ([place]) 11/04/2016  . Hypokalemia 11/01/2016  . Hypomagnesemia 11/01/2016  . SBO (small bowel obstruction) ([place])   . AKI (acute kidney injury) ([place]) 10/30/2016  . Leukocytosis 10/30/2016  . GERD (gastroesophageal reflux disease) 10/29/2016  . Small bowel obstruction ([place]) 10/29/2016  . History of drug use 07/17/2016  . Constipation 05/30/2016  . Fecal incontinence 01/04/2016  . Hepatic cirrhosis ([place]) 04/08/2015  . History of adenomatous polyp of colon   . History of colonic polyps   . Hx of adenomatous colonic polyps 12/10/2014  . Lichen sclerosus et atrophicus  10/13/2014  . Vaginal itching 10/01/2014  . Vaginal irritation 10/01/2014  . Yeast infection of the vagina 10/01/2014  . Blurred vision, right eye 03/20/2014  . Cocaine abuse ([place]) 03/20/2014  . Generalized weakness 03/20/2014  . Chest pain 02/10/2014  . Chest pain at rest 02/10/2014  . Type 2 diabetes mellitus ([place])   . Diabetes mellitus, type II ([place])   . Tobacco abuse   . PSVT (paroxysmal supraventricular tachycardia) ([place]) 11/04/2013  . Essential hypertension 10/16/2013  . Renal mass 10/14/2013  . Ovarian mass, left 08/17/2013  . Hepatitis C, chronic ([place]) 07/01/2013  . Elevated LFTs 07/01/2013  . Other dysphagia 07/01/2013  . Goals of care, counseling/discussion 07/01/2013    Orientation RESPIRATION BLADDER Height & Weight     Self  Normal Continent Weight: 142 lb 6.7 oz (64.6 kg) Height:  5' (152.4 cm)  BEHAVIORAL SYMPTOMS/MOOD NEUROLOGICAL BOWEL NUTRITION STATUS  (none) (none) Continent Diet(see dc summary)  AMBULATORY STATUS COMMUNICATION OF NEEDS Skin   Extensive Assist Verbally Normal                       Personal Care Assistance Level of Assistance  Bathing, Feeding, Dressing Bathing Assistance: Maximum assistance Feeding assistance: Limited assistance Dressing Assistance: Maximum assistance     Functional Limitations Info  Sight, Hearing, Speech Sight Info: Adequate Hearing Info: Adequate Speech Info: Adequate    SPECIAL CARE FACTORS FREQUENCY  PT (By licensed PT), OT (By licensed OT), Speech therapy     PT Frequency: 5X/W OT Frequency: min 3x/week     Speech Therapy Frequency: min 3x/week  Contractures Contractures Info: Not present    Additional Factors Info  Code Status, Allergies Code Status Info: full Allergies Info: Sulfa Antibiotics           Current Medications (01/10/2018):  This is the current hospital active medication list Current Facility-Administered Medications  Medication Dose Route Frequency Provider Last  Rate Last Dose  . 0.9 %  sodium chloride infusion   Intravenous PRN Kathie Dike, MD 10 mL/hr at 01/04/18 0900    . acetaminophen (TYLENOL) tablet 650 mg  650 mg Oral Q4H PRN Johnson, Clanford L, MD   650 mg at 12/24/17 0529   Or  . acetaminophen (TYLENOL) solution 650 mg  650 mg Per Tube Q4H PRN Johnson, Clanford L, MD       Or  . acetaminophen (TYLENOL) suppository 650 mg  650 mg Rectal Q4H PRN Wynetta Emery, Clanford L, MD   650 mg at 12/28/17 0242  . albuterol (PROVENTIL) (2.5 MG/3ML) 0.083% nebulizer solution 2.5 mg  2.5 mg Nebulization Q4H PRN Johnson, Clanford L, MD      . amiodarone (PACERONE) tablet 100 mg  100 mg Oral Daily Satira Sark, MD   100 mg at 01/10/18 1050  . amLODipine (NORVASC) tablet 5 mg  5 mg Oral Daily Kathie Dike, MD   5 mg at 01/10/18 1050  . aspirin tablet 325 mg  325 mg Oral Daily Johnson, Clanford L, MD   325 mg at 01/10/18 1049   Or  . aspirin suppository 300 mg  300 mg Rectal Daily Johnson, Clanford L, MD   300 mg at 01/02/18 1205  . atorvastatin (LIPITOR) tablet 80 mg  80 mg Oral q1800 Satira Sark, MD   80 mg at 01/08/18 1730  . carvedilol (COREG) tablet 6.25 mg  6.25 mg Oral BID WC Satira Sark, MD   6.25 mg at 01/10/18 0827  . cloNIDine (CATAPRES - Dosed in mg/24 hr) patch 0.1 mg  0.1 mg Transdermal Weekly Johnson, Clanford L, MD   0.1 mg at 01/09/18 1205  . clopidogrel (PLAVIX) tablet 75 mg  75 mg Oral Q breakfast Phillips Odor, MD   75 mg at 01/10/18 0827  . enoxaparin (LOVENOX) injection 40 mg  40 mg Subcutaneous Q24H Kathie Dike, MD   40 mg at 01/08/18 1356  . feeding supplement (ENSURE ENLIVE) (ENSURE ENLIVE) liquid 237 mL  237 mL Oral BID BM Johnson, Clanford L, MD   237 mL at 01/08/18 1355  . folic acid (FOLVITE) tablet 1 mg  1 mg Oral Daily Kathie Dike, MD   1 mg at 01/10/18 1050  . hydrALAZINE (APRESOLINE) injection 10 mg  10 mg Intravenous Q4H PRN Wynetta Emery, Clanford L, MD   10 mg at 01/04/18 2000  . Influenza vac split  quadrivalent PF (FLUZONE HIGH-DOSE) injection 0.5 mL  0.5 mL Intramuscular Tomorrow-1000 Murlean Iba, MD   Stopped at 12/24/17 1225  . LORazepam (ATIVAN) injection 1 mg  1 mg Intravenous Q4H PRN Kathie Dike, MD   1 mg at 01/09/18 1809  . LORazepam (ATIVAN) tablet 1 mg  1 mg Oral Q4H PRN Johnson, Clanford L, MD   1 mg at 01/09/18 1153  . MEDLINE mouth rinse  15 mL Mouth Rinse BID Johnson, Clanford L, MD   15 mL at 01/10/18 1052  . multivitamin with minerals tablet 1 tablet  1 tablet Oral Daily Vita Erm, NP   1 tablet at 01/10/18 1050  . nicotine (NICODERM CQ - dosed in mg/24 hours)  patch 21 mg  21 mg Transdermal Daily Johnson, Clanford L, MD   21 mg at 01/10/18 1051  . nitroGLYCERIN (NITROSTAT) SL tablet 0.4 mg  0.4 mg Sublingual Q5 min PRN Johnson, Clanford L, MD      . senna-docusate (Senokot-S) tablet 1 tablet  1 tablet Oral QHS PRN Johnson, Clanford L, MD      . thiamine (VITAMIN B-1) tablet 100 mg  100 mg Oral Daily Vita Erm, NP   100 mg at 01/10/18 1050   Or  . thiamine (B-1) injection 100 mg  100 mg Intravenous Daily Vita Erm, NP   100 mg at 01/04/18 7227     Discharge Medications: Please see discharge summary for a list of discharge medications.  Relevant Imaging Results:  Relevant Lab Results:   Additional Information SSN: [id number] LCSW

## 2018-01-11 LAB — URINE CULTURE

## 2018-02-03 ENCOUNTER — Ambulatory Visit (INDEPENDENT_AMBULATORY_CARE_PROVIDER_SITE_OTHER): Payer: Medicare Other | Admitting: Vascular Surgery

## 2018-02-03 ENCOUNTER — Encounter: Payer: Self-pay | Admitting: Vascular Surgery

## 2018-02-03 VITALS — BP 143/81 | HR 78 | Temp 96.9°F | Resp 16 | Ht 61.0 in | Wt 130.0 lb

## 2018-02-03 DIAGNOSIS — I6521 Occlusion and stenosis of right carotid artery: Secondary | ICD-10-CM

## 2018-02-03 NOTE — Progress Notes (Signed)
Vascular and Vein Specialist of [facility]  Patient name: Cassandra Jordan MRN: [id number] DOB: 1950/02/03 Sex: female  REASON FOR CONSULT: Symptomatic right carotid stenosis  HPI: Cassandra Jordan is a 68 y.o. female, who is in today for discussion of her symptomatic right carotid stenosis.  She is here today with her daughter [name].  Have her daughter for history and also her chart from several admissions to [facility].  Recently she was discharged from the hospital 1 month ago with stroke.  She was discharged to a nursing facility.  She did live independently prior to this admission.  She had a neurological event in April 2019.  Work-up at that time included a CT angiogram showing critical stenosis of her right internal carotid artery.  MR suggested potential strokes in both her right and left hemisphere.  She did not follow-up for further evaluation after this.  She was admitted with a new deficit in December 2019.  She has had persistent deficits which she and her daughter described as confusion and some slurred speech and also weakness in both lower extremities.  She reports this may be worse on her right than on her left.  Does have a history of her heart rate.  Has a history of extensive alcohol and cocaine abuse in the past and this was active up until her admission in December 2019.  Past Medical History:  Diagnosis Date  . Alcohol abuse   . Arthritis   . Asthma   . Cocaine abuse ([place])   . Essential hypertension   . GERD (gastroesophageal reflux disease)   . Hepatitis C   . Hyperlipidemia   . Lichen sclerosus et atrophicus 10/13/2014  . Neuropathic pain   . Noncompliance   . PSVT (paroxysmal supraventricular tachycardia) ([place])   . Rash   . Renal carcinoma ([place])   . Sleep apnea   . Type 2 diabetes mellitus ([place])   . Vaginal irritation 10/01/2014  . Vaginal itching 10/01/2014  . Yeast infection of the vagina 10/01/2014    Family History    Problem Relation Age of Onset  . Colon cancer Sister 9  . Cancer Mother   . Heart attack Father   . Hypertension Daughter   . Cancer Maternal Grandfather   . Diabetes Paternal Grandmother     SOCIAL HISTORY: Social History   Socioeconomic History  . Marital status: Single    Spouse name: Not on file  . Number of children: Not on file  . Years of education: Not on file  . Highest education level: Not on file  Occupational History  . Not on file  Social Needs  . Financial resource strain: Not on file  . Food insecurity:    Worry: Not on file    Inability: Not on file  . Transportation needs:    Medical: Not on file    Non-medical: Not on file  Tobacco Use  . Smoking status: Current Some Day Smoker    Packs/day: 0.10    Years: 40.00    Pack years: 4.00    Types: Cigarettes  . Smokeless tobacco: Never Used  Substance and Sexual Activity  . Alcohol use: Not Currently    Alcohol/week: 0.0 standard drinks    Comment: rarely  . Drug use: Yes    Types: Marijuana, Cocaine    Comment: last used yesterday  . Sexual activity: Not Currently    Birth control/protection: Surgical    Comment: hyst  Lifestyle  .  Physical activity:    Days per week: Not on file    Minutes per session: Not on file  . Stress: Not on file  Relationships  . Social connections:    Talks on phone: Not on file    Gets together: Not on file    Attends religious service: Not on file    Active member of club or organization: Not on file    Attends meetings of clubs or organizations: Not on file    Relationship status: Not on file  . Intimate partner violence:    Fear of current or ex partner: Not on file    Emotionally abused: Not on file    Physically abused: Not on file    Forced sexual activity: Not on file  Other Topics Concern  . Not on file  Social History Narrative  . Not on file    Allergies  Allergen Reactions  . Sulfa Antibiotics Itching and Other (See Comments)    burning     Current Outpatient Medications  Medication Sig Dispense Refill  . amiodarone (PACERONE) 100 MG tablet Take 1 tablet (100 mg total) by mouth daily. TAKE ONE TABLET BY MOUTH ONCE DAILY 90 tablet 1  . amLODipine (NORVASC) 10 MG tablet Take 1 tablet (10 mg total) by mouth daily. 30 tablet 1  . aspirin 325 MG tablet Take 1 tablet (325 mg total) by mouth daily.    Marland Kitchen atorvastatin (LIPITOR) 80 MG tablet Take 1 tablet (80 mg total) by mouth daily at 6 PM.    . carvedilol (COREG) 6.25 MG tablet Take 6.25 mg by mouth 2 (two) times daily.  0  . cloNIDine (CATAPRES - DOSED IN MG/24 HR) 0.1 mg/24hr patch Place 1 patch (0.1 mg total) onto the skin once a week. 4 patch 12  . clopidogrel (PLAVIX) 75 MG tablet Take 1 tablet (75 mg total) by mouth daily with breakfast.    . fenofibrate (TRICOR) 145 MG tablet Take 145 mg by mouth daily.    . haloperidol (HALDOL) 5 MG tablet Take 5 mg by mouth 2 (two) times daily.    . haloperidol lactate (HALDOL) 5 MG/ML injection every 6 (six) hours as needed.    Marland Kitchen QUEtiapine (SEROQUEL) 200 MG tablet Take 200 mg by mouth at bedtime.    Marland Kitchen QUEtiapine (SEROQUEL) 400 MG tablet Take 400 mg by mouth at bedtime.     No current facility-administered medications for this visit.     REVIEW OF SYSTEMS:  [X]  denotes positive finding, [ ]  denotes negative finding Cardiac  Comments:  Chest pain or chest pressure:    Shortness of breath upon exertion:    Short of breath when lying flat:    Irregular heart rhythm: x       Vascular    Pain in calf, thigh, or hip brought on by ambulation:    Pain in feet at night that wakes you up from your sleep:     Blood clot in your veins:    Leg swelling:         Pulmonary    Oxygen at home:    Productive cough:     Wheezing:         Neurologic    Sudden weakness in arms or legs:  x   Sudden numbness in arms or legs:  x   Sudden onset of difficulty speaking or slurred speech:    Temporary loss of vision in one eye:     Problems with  dizziness:         Gastrointestinal    Blood in stool:     Vomited blood:         Genitourinary    Burning when urinating:     Blood in urine:        Psychiatric    Major depression:         Hematologic    Bleeding problems:    Problems with blood clotting too easily:        Skin    Rashes or ulcers:        Constitutional    Fever or chills:      PHYSICAL EXAM: Vitals:   02/03/18 1121 02/03/18 1122  BP: 138/78 (!) 143/81  Pulse: 80 78  Resp: 16   Temp: (!) 96.9 F (36.1 C)   TempSrc: Temporal   Weight: 130 lb (59 kg)   Height: 5' 1"  (1.549 m)     GENERAL: The patient is a well-nourished female, in no acute distress. The vital signs are documented above. CARDIOVASCULAR: Carotid arteries are without bruits bilaterally.  She has 2+ radial and 2+ dorsalis pedis pulses bilaterally PULMONARY: There is good air exchange  ABDOMEN: Soft and non-tender  MUSCULOSKELETAL: There are no major deformities or cyanosis. NEUROLOGIC: No focal weakness or paresthesias are detected.  She is able to walk with assistance.  She does not require a walker or cane.  She is somewhat unsteady in her gait.  She does answer questions appropriately but is somewhat slow SKIN: There are no ulcers or rashes noted. PSYCHIATRIC: The patient has a normal affect.  DATA:  Reviewed her CT angiogram showing high-grade calcified stenosis of the right internal carotid with normal internal carotid artery above this.  MEDICAL ISSUES: With the patient and her daughter present.  I explained the concern of recurrent neurologic deficits related to her high-grade right carotid stenosis.  I have recommended endarterectomy for reduction of stroke risk.  I explained the timing of this would depend on her feeling of her achieving a baseline recovery.  The daughter feels that she has stabilized and wishes to proceed electively as soon as possible.  I did discuss the procedure of endarterectomy with an expected one night  hospitalization.  Also explained the slight risk of stroke associated with the procedure and other complications.  We will schedule this at her earliest [place]. Early, MD [facility] V[facility] and [facility] Specialists of [facility] Tel [phone] Pager [phone]

## 2018-02-05 ENCOUNTER — Telehealth: Payer: Self-pay | Admitting: *Deleted

## 2018-02-05 NOTE — Telephone Encounter (Signed)
Call and left message for patient's daughter Cassandra Jordan to call this office to schedule surgery for endarterectomy.

## 2018-02-07 ENCOUNTER — Encounter: Payer: Self-pay | Admitting: *Deleted

## 2018-02-07 ENCOUNTER — Other Ambulatory Visit: Payer: Self-pay | Admitting: *Deleted

## 2018-02-07 NOTE — Progress Notes (Signed)
Spoke with [place] patient's daughter to arrange date for surgery. Reminded her legal POA will need to sign consent if mother is unable to give legal consent. To be at [facility] admitting at 5:30 am on1/31/2020. Pre-op instructions per letter from [facility] reviewed with daughter and [facility] [place] faxed same letter of instructions Attn. April.

## 2018-02-15 ENCOUNTER — Observation Stay (HOSPITAL_COMMUNITY): Payer: Medicare Other

## 2018-02-15 ENCOUNTER — Inpatient Hospital Stay (HOSPITAL_COMMUNITY)
Admission: EM | Admit: 2018-02-15 | Discharge: 2018-02-21 | DRG: 441 | Disposition: A | Discharge status: Skilled Nursing Facility | Payer: Medicare Other | Attending: Internal Medicine | Admitting: Internal Medicine

## 2018-02-15 ENCOUNTER — Encounter (HOSPITAL_COMMUNITY): Payer: Self-pay

## 2018-02-15 ENCOUNTER — Other Ambulatory Visit: Payer: Self-pay

## 2018-02-15 DIAGNOSIS — R6510 Systemic inflammatory response syndrome (SIRS) of non-infectious origin without acute organ dysfunction: Secondary | ICD-10-CM

## 2018-02-15 DIAGNOSIS — N183 Chronic kidney disease, stage 3 unspecified: Secondary | ICD-10-CM | POA: Diagnosis present

## 2018-02-15 DIAGNOSIS — G4730 Sleep apnea, unspecified: Secondary | ICD-10-CM | POA: Diagnosis present

## 2018-02-15 DIAGNOSIS — Z833 Family history of diabetes mellitus: Secondary | ICD-10-CM

## 2018-02-15 DIAGNOSIS — R410 Disorientation, unspecified: Secondary | ICD-10-CM | POA: Diagnosis not present

## 2018-02-15 DIAGNOSIS — I5042 Chronic combined systolic (congestive) and diastolic (congestive) heart failure: Secondary | ICD-10-CM | POA: Diagnosis not present

## 2018-02-15 DIAGNOSIS — N179 Acute kidney failure, unspecified: Secondary | ICD-10-CM | POA: Diagnosis not present

## 2018-02-15 DIAGNOSIS — E785 Hyperlipidemia, unspecified: Secondary | ICD-10-CM | POA: Diagnosis present

## 2018-02-15 DIAGNOSIS — R945 Abnormal results of liver function studies: Secondary | ICD-10-CM | POA: Diagnosis not present

## 2018-02-15 DIAGNOSIS — D72829 Elevated white blood cell count, unspecified: Secondary | ICD-10-CM | POA: Diagnosis present

## 2018-02-15 DIAGNOSIS — I10 Essential (primary) hypertension: Secondary | ICD-10-CM | POA: Diagnosis present

## 2018-02-15 DIAGNOSIS — E860 Dehydration: Secondary | ICD-10-CM | POA: Diagnosis present

## 2018-02-15 DIAGNOSIS — J690 Pneumonitis due to inhalation of food and vomit: Secondary | ICD-10-CM

## 2018-02-15 DIAGNOSIS — G9341 Metabolic encephalopathy: Secondary | ICD-10-CM | POA: Diagnosis present

## 2018-02-15 DIAGNOSIS — K219 Gastro-esophageal reflux disease without esophagitis: Secondary | ICD-10-CM | POA: Diagnosis present

## 2018-02-15 DIAGNOSIS — E872 Acidosis: Secondary | ICD-10-CM | POA: Diagnosis present

## 2018-02-15 DIAGNOSIS — Z8249 Family history of ischemic heart disease and other diseases of the circulatory system: Secondary | ICD-10-CM

## 2018-02-15 DIAGNOSIS — Z7982 Long term (current) use of aspirin: Secondary | ICD-10-CM

## 2018-02-15 DIAGNOSIS — Z90710 Acquired absence of both cervix and uterus: Secondary | ICD-10-CM

## 2018-02-15 DIAGNOSIS — G9340 Encephalopathy, unspecified: Secondary | ICD-10-CM | POA: Diagnosis not present

## 2018-02-15 DIAGNOSIS — K7290 Hepatic failure, unspecified without coma: Secondary | ICD-10-CM | POA: Diagnosis not present

## 2018-02-15 DIAGNOSIS — K7030 Alcoholic cirrhosis of liver without ascites: Secondary | ICD-10-CM | POA: Diagnosis present

## 2018-02-15 DIAGNOSIS — E869 Volume depletion, unspecified: Secondary | ICD-10-CM | POA: Diagnosis present

## 2018-02-15 DIAGNOSIS — Z86010 Personal history of colonic polyps: Secondary | ICD-10-CM

## 2018-02-15 DIAGNOSIS — F05 Delirium due to known physiological condition: Secondary | ICD-10-CM | POA: Diagnosis present

## 2018-02-15 DIAGNOSIS — K7682 Hepatic encephalopathy: Secondary | ICD-10-CM | POA: Diagnosis present

## 2018-02-15 DIAGNOSIS — D631 Anemia in chronic kidney disease: Secondary | ICD-10-CM | POA: Diagnosis present

## 2018-02-15 DIAGNOSIS — R7989 Other specified abnormal findings of blood chemistry: Secondary | ICD-10-CM

## 2018-02-15 DIAGNOSIS — F17210 Nicotine dependence, cigarettes, uncomplicated: Secondary | ICD-10-CM | POA: Diagnosis present

## 2018-02-15 DIAGNOSIS — I6521 Occlusion and stenosis of right carotid artery: Secondary | ICD-10-CM | POA: Diagnosis present

## 2018-02-15 DIAGNOSIS — Z85528 Personal history of other malignant neoplasm of kidney: Secondary | ICD-10-CM

## 2018-02-15 DIAGNOSIS — Z79899 Other long term (current) drug therapy: Secondary | ICD-10-CM

## 2018-02-15 DIAGNOSIS — R9431 Abnormal electrocardiogram [ECG] [EKG]: Secondary | ICD-10-CM

## 2018-02-15 DIAGNOSIS — Z66 Do not resuscitate: Secondary | ICD-10-CM | POA: Diagnosis present

## 2018-02-15 DIAGNOSIS — I2510 Atherosclerotic heart disease of native coronary artery without angina pectoris: Secondary | ICD-10-CM | POA: Diagnosis present

## 2018-02-15 DIAGNOSIS — Z905 Acquired absence of kidney: Secondary | ICD-10-CM

## 2018-02-15 DIAGNOSIS — L900 Lichen sclerosus et atrophicus: Secondary | ICD-10-CM | POA: Diagnosis present

## 2018-02-15 DIAGNOSIS — I130 Hypertensive heart and chronic kidney disease with heart failure and stage 1 through stage 4 chronic kidney disease, or unspecified chronic kidney disease: Secondary | ICD-10-CM | POA: Diagnosis present

## 2018-02-15 DIAGNOSIS — Z4659 Encounter for fitting and adjustment of other gastrointestinal appliance and device: Secondary | ICD-10-CM

## 2018-02-15 DIAGNOSIS — E11649 Type 2 diabetes mellitus with hypoglycemia without coma: Secondary | ICD-10-CM | POA: Diagnosis not present

## 2018-02-15 DIAGNOSIS — Z8673 Personal history of transient ischemic attack (TIA), and cerebral infarction without residual deficits: Secondary | ICD-10-CM

## 2018-02-15 DIAGNOSIS — J45909 Unspecified asthma, uncomplicated: Secondary | ICD-10-CM | POA: Diagnosis present

## 2018-02-15 DIAGNOSIS — I959 Hypotension, unspecified: Secondary | ICD-10-CM | POA: Diagnosis present

## 2018-02-15 DIAGNOSIS — B182 Chronic viral hepatitis C: Secondary | ICD-10-CM | POA: Diagnosis present

## 2018-02-15 DIAGNOSIS — M1990 Unspecified osteoarthritis, unspecified site: Secondary | ICD-10-CM | POA: Diagnosis present

## 2018-02-15 DIAGNOSIS — Z882 Allergy status to sulfonamides status: Secondary | ICD-10-CM

## 2018-02-15 DIAGNOSIS — R809 Proteinuria, unspecified: Secondary | ICD-10-CM | POA: Diagnosis present

## 2018-02-15 DIAGNOSIS — Z7902 Long term (current) use of antithrombotics/antiplatelets: Secondary | ICD-10-CM

## 2018-02-15 DIAGNOSIS — E1122 Type 2 diabetes mellitus with diabetic chronic kidney disease: Secondary | ICD-10-CM | POA: Diagnosis present

## 2018-02-15 LAB — COMPREHENSIVE METABOLIC PANEL
ALT: 74 U/L — ABNORMAL HIGH (ref 0–44)
AST: 184 U/L — ABNORMAL HIGH (ref 15–41)
Albumin: 3 g/dL — ABNORMAL LOW (ref 3.5–5.0)
Alkaline Phosphatase: 63 U/L (ref 38–126)
Anion gap: 10 (ref 5–15)
BILIRUBIN TOTAL: 1 mg/dL (ref 0.3–1.2)
BUN: 54 mg/dL — ABNORMAL HIGH (ref 8–23)
CO2: 24 mmol/L (ref 22–32)
Calcium: 8.3 mg/dL — ABNORMAL LOW (ref 8.9–10.3)
Chloride: 101 mmol/L (ref 98–111)
Creatinine, Ser: 3.47 mg/dL — ABNORMAL HIGH (ref 0.44–1.00)
GFR calc non Af Amer: 13 mL/min — ABNORMAL LOW (ref >60)
GFR, EST AFRICAN AMERICAN: 15 mL/min — AB (ref >60)
Glucose, Bld: 124 mg/dL — ABNORMAL HIGH (ref 70–99)
Potassium: 4.7 mmol/L (ref 3.5–5.1)
Sodium: 135 mmol/L (ref 135–145)
Total Protein: 7.2 g/dL (ref 6.5–8.1)

## 2018-02-15 LAB — CBC WITH DIFFERENTIAL/PLATELET
Abs Immature Granulocytes: 0.13 10*3/uL — ABNORMAL HIGH (ref 0.00–0.07)
Basophils Absolute: 0 10*3/uL (ref 0.0–0.1)
Basophils Relative: 0 %
Eosinophils Absolute: 0 10*3/uL (ref 0.0–0.5)
Eosinophils Relative: 0 %
HEMATOCRIT: 28.7 % — AB (ref 36.0–46.0)
Hemoglobin: 9 g/dL — ABNORMAL LOW (ref 12.0–15.0)
Immature Granulocytes: 1 %
LYMPHS ABS: 2.3 10*3/uL (ref 0.7–4.0)
Lymphocytes Relative: 12 %
MCH: 27.8 pg (ref 26.0–34.0)
MCHC: 31.4 g/dL (ref 30.0–36.0)
MCV: 88.6 fL (ref 80.0–100.0)
Monocytes Absolute: 1.8 10*3/uL — ABNORMAL HIGH (ref 0.1–1.0)
Monocytes Relative: 9 %
Neutro Abs: 14.6 10*3/uL — ABNORMAL HIGH (ref 1.7–7.7)
Neutrophils Relative %: 78 %
Platelets: 225 10*3/uL (ref 150–400)
RBC: 3.24 MIL/uL — ABNORMAL LOW (ref 3.87–5.11)
RDW: 15.1 % (ref 11.5–15.5)
WBC: 18.8 10*3/uL — ABNORMAL HIGH (ref 4.0–10.5)
nRBC: 0 % (ref 0.0–0.2)

## 2018-02-15 LAB — ETHANOL

## 2018-02-15 LAB — URINALYSIS, ROUTINE W REFLEX MICROSCOPIC
Bilirubin Urine: NEGATIVE
Glucose, UA: 50 mg/dL — AB
Hgb urine dipstick: NEGATIVE
Ketones, ur: NEGATIVE mg/dL
Leukocytes, UA: NEGATIVE
Nitrite: NEGATIVE
PH: 7 (ref 5.0–8.0)
Protein, ur: >=300 mg/dL — AB
Specific Gravity, Urine: 1.017 (ref 1.005–1.030)

## 2018-02-15 LAB — RAPID URINE DRUG SCREEN, HOSP PERFORMED
Amphetamines: NOT DETECTED
Barbiturates: NOT DETECTED
Benzodiazepines: NOT DETECTED
Cocaine: NOT DETECTED
Opiates: NOT DETECTED
Tetrahydrocannabinol: NOT DETECTED

## 2018-02-15 LAB — LIPASE, BLOOD: Lipase: 18 U/L (ref 11–51)

## 2018-02-15 LAB — AMMONIA: Ammonia: 45 umol/L — ABNORMAL HIGH (ref 9–35)

## 2018-02-15 MED ORDER — ONDANSETRON HCL 4 MG PO TABS: 4.0000 mg | ORAL_TABLET | Freq: Four times a day (QID) | ORAL | Status: DC | PRN

## 2018-02-15 MED ORDER — ONDANSETRON HCL 4 MG/2ML IJ SOLN: 4.0000 mg | Freq: Four times a day (QID) | INTRAMUSCULAR | Status: DC | PRN

## 2018-02-15 MED ORDER — QUETIAPINE FUMARATE 100 MG PO TABS
200.0000 mg | ORAL_TABLET | Freq: Every day | ORAL | Status: DC
  Administered 2018-02-15 – 2018-02-20 (×6): 200 mg via ORAL
  Filled 2018-02-15 (×6): qty 2

## 2018-02-15 MED ORDER — HALOPERIDOL 2 MG PO TABS
2.0000 mg | ORAL_TABLET | Freq: Three times a day (TID) | ORAL | Status: DC
  Administered 2018-02-15 – 2018-02-16 (×4): 2 mg via ORAL
  Filled 2018-02-15 (×10): qty 1

## 2018-02-15 MED ORDER — SODIUM CHLORIDE 0.9 % IV SOLN: INTRAVENOUS | Status: AC

## 2018-02-15 MED ORDER — QUETIAPINE FUMARATE 100 MG PO TABS
400.0000 mg | ORAL_TABLET | Freq: Every day | ORAL | Status: DC
  Administered 2018-02-15 – 2018-02-17 (×3): 400 mg via ORAL
  Filled 2018-02-15 (×3): qty 4

## 2018-02-15 MED ORDER — HEPARIN SODIUM (PORCINE) 5000 UNIT/ML IJ SOLN
5000.0000 [IU] | Freq: Three times a day (TID) | INTRAMUSCULAR | Status: DC
  Administered 2018-02-16 – 2018-02-21 (×15): 5000 [IU] via SUBCUTANEOUS
  Filled 2018-02-15 (×14): qty 1

## 2018-02-15 MED ORDER — POLYETHYLENE GLYCOL 3350 17 G PO PACK: 17.0000 g | PACK | Freq: Every day | ORAL | Status: DC | PRN

## 2018-02-15 MED ORDER — SODIUM CHLORIDE 0.9 % IV BOLUS
500.0000 mL | Freq: Once | INTRAVENOUS | Status: AC
  Administered 2018-02-15: 500 mL via INTRAVENOUS

## 2018-02-15 MED ORDER — FAMOTIDINE 20 MG PO TABS: 20.0000 mg | ORAL_TABLET | Freq: Every day | ORAL | Status: DC

## 2018-02-15 MED ORDER — CLOPIDOGREL BISULFATE 75 MG PO TABS
75.0000 mg | ORAL_TABLET | Freq: Every day | ORAL | Status: DC
  Administered 2018-02-16 – 2018-02-21 (×5): 75 mg via ORAL
  Filled 2018-02-15 (×6): qty 1

## 2018-02-15 MED ORDER — ACETAMINOPHEN 650 MG RE SUPP: 650.0000 mg | Freq: Four times a day (QID) | RECTAL | Status: DC | PRN

## 2018-02-15 MED ORDER — ACETAMINOPHEN 325 MG PO TABS
650.0000 mg | ORAL_TABLET | Freq: Four times a day (QID) | ORAL | Status: DC | PRN
  Administered 2018-02-16: 650 mg via ORAL
  Filled 2018-02-15: qty 2

## 2018-02-15 MED ORDER — ASPIRIN 325 MG PO TABS
325.0000 mg | ORAL_TABLET | Freq: Every day | ORAL | Status: DC
  Administered 2018-02-16 – 2018-02-21 (×5): 325 mg via ORAL
  Filled 2018-02-15 (×6): qty 1

## 2018-02-15 NOTE — H&P (Signed)
History and Physical    Cassandra Jordan XAJ:[id number] DOB: 08/07/1950 DOA: 02/15/2018  PCP: Abran Richard, MD   Patient coming from: SNF    Chief Complaint: increased confusion, agitation   HPI: Cassandra Jordan is a 68 y.o. female with medical history significant for hepatitis C with cirrhosis, cocaine abuse, hypertension, chronic kidney disease stage III, carotid artery stenosis, and recent admission for acute CVA, now presenting from her nursing facility for evaluation of increased confusion and agitation.  Per report of SNF personnel, the patient has some baseline confusion, but was more disoriented today and became agitated this evening.  She was given a dose of Ativan at the nursing facility and has been more calm since then, but remains more confused than is typical for her.  Patient has not expressed any specific complaints.  She is unable to contribute to the history due to her clinical condition.  She is scheduled for right carotid endarterectomy in about a week.  ED Course: Upon arrival to the ED, patient is found to be afebrile, saturating well on room air, blood pressure 85/67, and vitals otherwise normal.  EKG features a sinus rhythm with T wave inversions in the anterolateral leads and QTc interval 509 ms.  Chemistry panel is notable for mild elevation in transaminases and creatinine of 3.47, up from 1.48 last month.  CBC is notable for leukocytosis to 18,800 and a normocytic anemia with hemoglobin 9.0, down from 9.4 last month.  UDS is negative and urinalysis is notable for proteinuria.  Patient was given 500 cc of normal saline in the ED and urine was sent for culture.  Blood pressure normalized with the fluid bolus, she remains hemodynamically stable, and will be observed for further evaluation and management of acute encephalopathy and acute kidney injury superimposed on chronic kidney disease stage III.  Review of Systems:  Unable to complete ROS secondary to the patient's  clinical condition.  Past Medical History:  Diagnosis Date  . Alcohol abuse   . Arthritis   . Asthma   . Cocaine abuse ([place])   . Essential hypertension   . GERD (gastroesophageal reflux disease)   . Hepatitis C   . Hyperlipidemia   . Lichen sclerosus et atrophicus 10/13/2014  . Neuropathic pain   . Noncompliance   . PSVT (paroxysmal supraventricular tachycardia) ([place])   . Rash   . Renal carcinoma ([place])   . Sleep apnea   . Type 2 diabetes mellitus ([place])   . Vaginal irritation 10/01/2014  . Vaginal itching 10/01/2014  . Yeast infection of the vagina 10/01/2014    Past Surgical History:  Procedure Laterality Date  . ABDOMINAL HYSTERECTOMY    . COLONOSCOPY N/A 07/08/2013   RMR: Multiple colonic polyps treated [name] as described above. Inadequate prepartation comprimised examination there colonic diverticulosis. tubular and tubulovillous adenomas. next TCS 12/2013  . COLONOSCOPY N/A 02/09/2015   RMR: Colonic polyp removed as described above. Tubular adenoma. Next colonoscopy January 2022.  . ESOPHAGOGASTRODUODENOSCOPY N/A 07/08/2013   RMR: Schatzki's ring ; small hiatal hernia-status post passage of a Maloney dilator  . MALONEY DILATION N/A 07/08/2013   Procedure: [name] DILATION;  Surgeon: [name], MD;  Location: [facility];  Service: Endoscopy;  Laterality: N/A;  . ROBOT ASSISTED LAPAROSCOPIC NEPHRECTOMY Right 10/14/2013   Procedure: ROBOTIC ASSISTED LAPAROSCOPIC RADICAL NEPHRECTOMY;  Surgeon: [name], MD;  Location: [facility];  Service: Urology;  Laterality: Right;  . SAVORY DILATION N/A 07/08/2013   Procedure: SAVORY DILATION;  Surgeon:  [name], MD;  Location: [facility];  Service: Endoscopy;  Laterality: N/A;  . TONSILLECTOMY    . Wart removal       reports that she has been smoking cigarettes. She has a 4.00 pack-year smoking history. She has never used smokeless tobacco. She reports previous alcohol use. She reports current drug use. Drugs: Marijuana and  Cocaine.  Allergies  Allergen Reactions  . Sulfa Antibiotics Itching and Other (See Comments)    burning    Family History  Problem Relation Age of Onset  . Colon cancer Sister 7  . Cancer Mother   . Heart attack Father   . Hypertension Daughter   . Cancer Maternal Grandfather   . Diabetes Paternal Grandmother      Prior to Admission medications   Medication Sig Start Date End Date Taking? Authorizing Provider  acetaminophen (TYLENOL) 325 MG tablet Take 325 mg by mouth 2 (two) times daily as needed (pain.).    [provider]  amiodarone (PACERONE) 100 MG tablet Take 1 tablet (100 mg total) by mouth daily. TAKE ONE TABLET BY MOUTH ONCE DAILY Patient not taking: Reported on 02/12/2018 05/31/17   Satira Sark, MD  amLODipine (NORVASC) 10 MG tablet Take 1 tablet (10 mg total) by mouth daily. 11/04/16   Orson Eva, MD  aspirin 325 MG tablet Take 1 tablet (325 mg total) by mouth daily. 01/11/18   Kathie Dike, MD  atorvastatin (LIPITOR) 40 MG tablet Take 40 mg by mouth at bedtime.    [provider]  atorvastatin (LIPITOR) 80 MG tablet Take 1 tablet (80 mg total) by mouth daily at 6 PM. Patient not taking: Reported on 02/12/2018 01/10/18   Kathie Dike, MD  carvedilol (COREG) 6.25 MG tablet Take 6.25 mg by mouth 2 (two) times daily. 08/21/17   [provider]  cloNIDine (CATAPRES - DOSED IN MG/24 HR) 0.1 mg/24hr patch Place 1 patch (0.1 mg total) onto the skin once a week. 01/16/18   Kathie Dike, MD  clopidogrel (PLAVIX) 75 MG tablet Take 1 tablet (75 mg total) by mouth daily with breakfast. 01/11/18   Kathie Dike, MD  famotidine (PEPCID) 20 MG tablet Take 20 mg by mouth daily.    [provider]  fenofibrate (TRICOR) 145 MG tablet Take 145 mg by mouth daily.    [provider]  haloperidol (HALDOL) 2 MG tablet Take 2 mg by mouth 3 (three) times daily.    [provider]  magnesium hydroxide (MILK OF MAGNESIA) 400  MG/5ML suspension Take 30 mLs by mouth daily as needed for mild constipation.    [provider]  QUEtiapine (SEROQUEL) 200 MG tablet Take 200 mg by mouth at bedtime.    [provider]  QUEtiapine (SEROQUEL) 400 MG tablet Take 400 mg by mouth at bedtime.    [provider]    Physical Exam: Vitals:   02/15/18 2200 02/15/18 2203 02/15/18 2215 02/15/18 2230  BP: 108/79   104/77  Pulse:   89   Resp: 16  20   Temp:      TempSrc:      SpO2:   93%   Weight:  59 kg    Height:  5' 1"  (1.549 m)      Constitutional: NAD, calm  Eyes: PERTLA, lids and conjunctivae normal ENMT: Mucous membranes are moist. Posterior pharynx clear of any exudate or lesions.   Neck: normal, supple, no masses, no thyromegaly Respiratory: clear to auscultation bilaterally, no  wheezing, no crackles. Normal respiratory effort.    Cardiovascular: S1 & S2 heard, regular rate and rhythm. Mild pretibial pitting edema bilaterally. No significant JVD. Abdomen: No distension, no tenderness, soft. Bowel sounds active.  Musculoskeletal: no clubbing / cyanosis. No joint deformity upper and lower extremities.    Skin: no significant rashes, lesions, ulcers. Warm, dry, well-perfused. Neurologic: No facial asymmetry. Sensation intact. Moving all extremties.  Psychiatric: Oriented to self only. Calm.    Labs on Admission: I have personally reviewed following labs and imaging studies  CBC: Recent Labs  Lab 02/15/18 2115  WBC 18.8*  NEUTROABS 14.6*  HGB 9.0*  HCT 28.7*  MCV 88.6  PLT 100   Basic Metabolic Panel: Recent Labs  Lab 02/15/18 2115  NA 135  K 4.7  CL 101  CO2 24  GLUCOSE 124*  BUN 54*  CREATININE 3.47*  CALCIUM 8.3*   GFR: Estimated Creatinine Clearance: 13 mL/min (A) (by C-G formula based on SCr of 3.47 mg/dL (H)). Liver Function Tests: Recent Labs  Lab 02/15/18 2115  AST 184*  ALT 74*  ALKPHOS 63  BILITOT 1.0  PROT 7.2  ALBUMIN 3.0*   Recent Labs  Lab  02/15/18 2115  LIPASE 18   Recent Labs  Lab 02/15/18 2115  AMMONIA 45*   Coagulation Profile: No results for input(s): INR, PROTIME in the last 168 hours. Cardiac Enzymes: No results for input(s): CKTOTAL, CKMB, CKMBINDEX, TROPONINI in the last 168 hours. BNP (last 3 results) No results for input(s): PROBNP in the last 8760 hours. HbA1C: No results for input(s): HGBA1C in the last 72 hours. CBG: No results for input(s): GLUCAP in the last 168 hours. Lipid Profile: No results for input(s): CHOL, HDL, LDLCALC, TRIG, CHOLHDL, LDLDIRECT in the last 72 hours. Thyroid Function Tests: No results for input(s): TSH, T4TOTAL, FREET4, T3FREE, THYROIDAB in the last 72 hours. Anemia Panel: No results for input(s): VITAMINB12, FOLATE, FERRITIN, TIBC, IRON, RETICCTPCT in the last 72 hours. Urine analysis:    Component Value Date/Time   COLORURINE AMBER (A) 02/15/2018 2140   APPEARANCEUR HAZY (A) 02/15/2018 2140   LABSPEC 1.017 02/15/2018 2140   PHURINE 7.0 02/15/2018 2140   GLUCOSEU 50 (A) 02/15/2018 2140   HGBUR NEGATIVE 02/15/2018 2140   BILIRUBINUR NEGATIVE 02/15/2018 2140   KETONESUR NEGATIVE 02/15/2018 2140   PROTEINUR >=300 (A) 02/15/2018 2140   UROBILINOGEN 0.2 06/30/2014 2000   NITRITE NEGATIVE 02/15/2018 2140   LEUKOCYTESUR NEGATIVE 02/15/2018 2140   Sepsis Labs: @LABRCNTIP (procalcitonin:4,lacticidven:4) )No results found for this or any previous visit (from the past 240 hour(s)).   Radiological Exams on Admission: No results found.  EKG: Independently reviewed. Sinus rhythm, T-wave inversions in anterolateral leads, QTc 509 ms.   Assessment/Plan   1. Acute kidney injury superimposed on CKD III  - Presents with increased confusion and agitation, found to have low BP in ED with SCr of 3.47, up from 1.48 last month  - Most likely acute prerenal azotemia in setting of low BP  - She was given a 500 cc NS bolus in ED  - Check renal US and urine chemistries, continue  gentle IVF hydration, avoid nephrotoxins and hypotension, renally-dose medications, and repeat chem panel in am   2. Acute encephalopathy  - Presents with increased confusion and agitation  - In the ED, ammonia level is slightly elevated, leukocytosis noted without fever, UA not particularly suggestive of infection and sample was sent for culture  - Check head CT, TSH, B12, folate, and TSH  -  Continue supportive care   3. History of CVA; carotid artery stenosis  - Patient was admitted last month with CVA, found to have carotid stenosis, and is planned for upcoming CEA  - Checking head CT as above  - Continue ASA and Plavix, hold Lipitor initially and repeat LFT's in am    4. Chronic combined systolic and diastolic CHF  - Echo from December 2019 with EF 02-58%, grade 2 diastolic dysfunction, and mild MR  - There is mild peripheral edema noted on admission, no JVD or dyspnea  - She is being gently hydrated as above  - Follow daily wt and I/O's, hold beta-blocker initially given low BP in ED    5. Prolonged QT interval  - QTc is 509 ms on admission  - Replace potassium to 4 and mag to 2, minimize QT-prolonging medications, repeat EKG in am   6. Leukocytosis  - WBC elevated to 18,800 on admission  - There is no fever, UA not particularly suggestive of infection, abdominal exam benign, no meningismus  - Check CXR, follow urine culture, culture blood if fever develops, watch off of antibiotics for now    DVT prophylaxis: sq heparin  Code Status: Full  Family Communication: Discussed with patient   Consults called: None Admission status: Observation     Vianne Bulls, MD [facility] Pager [phone]  If 7PM-7AM, please contact night-coverage [email or web address] Password [email or web address]  02/15/2018, 10:54 PM

## 2018-02-15 NOTE — ED Notes (Signed)
Dr Lita Mains notified of pts bp. Orders received.

## 2018-02-15 NOTE — ED Provider Notes (Signed)
[facility] EMERGENCY DEPARTMENT Provider Note   CSN: [id number] Arrival date & time: 02/15/18  2104     History   Chief Complaint Chief Complaint  Patient presents with  . Altered Mental Status    HPI Cassandra Jordan is a 68 y.o. female.  HPI Patient presents from nursing home with increased agitation and confusion.  Was given Ativan at 7 PM.  Has been drowsy since.  Vital signs stable in route by EMS.  Patient is unable to contribute to history.  Level 5 caveat applies. Past Medical History:  Diagnosis Date  . Alcohol abuse   . Arthritis   . Asthma   . Cocaine abuse ([place])   . Essential hypertension   . GERD (gastroesophageal reflux disease)   . Hepatitis C   . Hyperlipidemia   . Lichen sclerosus et atrophicus 10/13/2014  . Neuropathic pain   . Noncompliance   . PSVT (paroxysmal supraventricular tachycardia) ([place])   . Rash   . Renal carcinoma ([place])   . Sleep apnea   . Type 2 diabetes mellitus ([place])   . Vaginal irritation 10/01/2014  . Vaginal itching 10/01/2014  . Yeast infection of the vagina 10/01/2014    Patient Active Problem List   Diagnosis Date Noted  . Hepatic encephalopathy ([place]) 02/17/2018  . Chronic combined systolic and diastolic CHF (congestive heart failure) ([place]) 02/15/2018  . History of CVA in adulthood 02/15/2018  . Acute encephalopathy   . Advanced care planning/counseling discussion   . Palliative care by specialist   . Cerebellar stroke ([place])   . Prolonged QT interval 12/23/2017  . Hyperphosphatemia 05/22/2017  . Acute CVA (cerebrovascular accident) ([place]) 05/21/2017  . Abdominal pain 11/18/2016  . Cholelithiasis 11/18/2016  . Uncontrolled type 2 diabetes mellitus with hyperglycemia, with long-term current use of insulin ([place]) 11/18/2016  . CKD (chronic kidney disease), stage III ([place]) 11/18/2016  . Acute pancreatitis   . Ileus ([place])   . Porcelain gallbladder   . Acute renal failure superimposed on stage 3 chronic kidney disease ([place])  11/04/2016  . Hypokalemia 11/01/2016  . Hypomagnesemia 11/01/2016  . SBO (small bowel obstruction) ([place])   . AKI (acute kidney injury) ([place]) 10/30/2016  . Leukocytosis 10/30/2016  . GERD (gastroesophageal reflux disease) 10/29/2016  . Small bowel obstruction ([place]) 10/29/2016  . History of drug use 07/17/2016  . Constipation 05/30/2016  . Fecal incontinence 01/04/2016  . Hepatic cirrhosis ([place]) 04/08/2015  . History of adenomatous polyp of colon   . History of colonic polyps   . Hx of adenomatous colonic polyps 12/10/2014  . Lichen sclerosus et atrophicus 10/13/2014  . Vaginal itching 10/01/2014  . Vaginal irritation 10/01/2014  . Yeast infection of the vagina 10/01/2014  . Blurred vision, right eye 03/20/2014  . Cocaine abuse ([place]) 03/20/2014  . Generalized weakness 03/20/2014  . Chest pain 02/10/2014  . Chest pain at rest 02/10/2014  . Type 2 diabetes mellitus ([place])   . Diabetes mellitus, type II ([place])   . Tobacco abuse   . PSVT (paroxysmal supraventricular tachycardia) ([employer]) 11/04/2013  . Essential hypertension 10/16/2013  . Renal mass 10/14/2013  . Ovarian mass, left 08/17/2013  . Hepatitis C, chronic ([place]) 07/01/2013  . Elevated LFTs 07/01/2013  . Other dysphagia 07/01/2013  . Goals of care, counseling/discussion 07/01/2013    Past Surgical History:  Procedure Laterality Date  . ABDOMINAL HYSTERECTOMY    . COLONOSCOPY N/A 07/08/2013   RMR: Multiple colonic polyps treated [name] as described above. Inadequate prepartation comprimised  examination there colonic diverticulosis. tubular and tubulovillous adenomas. next TCS 12/2013  . COLONOSCOPY N/A 02/09/2015   RMR: Colonic polyp removed as described above. Tubular adenoma. Next colonoscopy January 2022.  . ESOPHAGOGASTRODUODENOSCOPY N/A 07/08/2013   RMR: Schatzki's ring ; small hiatal hernia-status post passage of a Maloney dilator  . MALONEY DILATION N/A 07/08/2013   Procedure: [name] DILATION;  Surgeon: [name], MD;  Location: [facility] ENDO SUITE;  Service: Endoscopy;  Laterality: N/A;  . ROBOT ASSISTED LAPAROSCOPIC NEPHRECTOMY Right 10/14/2013   Procedure: ROBOTIC ASSISTED LAPAROSCOPIC RADICAL NEPHRECTOMY;  Surgeon: [name], MD;  Location: [facility] ORS;  Service: Urology;  Laterality: Right;  . SAVORY DILATION N/A 07/08/2013   Procedure: SAVORY DILATION;  Surgeon: [name], MD;  Location: [facility] ENDO SUITE;  Service: Endoscopy;  Laterality: N/A;  . TONSILLECTOMY    . Wart removal       OB History    Gravida  1   Para  1   Term      Preterm      AB      Living  1     SAB      TAB      Ectopic      Multiple      Live Births               Home Medications    Prior to Admission medications   Medication Sig Start Date End Date Taking? Authorizing Provider  acetaminophen (TYLENOL) 325 MG tablet Take 325 mg by mouth 2 (two) times daily as needed (pain.).   Yes [provider]  amLODipine (NORVASC) 10 MG tablet Take 1 tablet (10 mg total) by mouth daily. 11/04/16  Yes TatShanon Brow, MD  aspirin 325 MG tablet Take 1 tablet (325 mg total) by mouth daily. 01/11/18  Yes Kathie Dike, MD  atorvastatin (LIPITOR) 40 MG tablet Take 40 mg by mouth at bedtime.   Yes [provider]  carvedilol (COREG) 6.25 MG tablet Take 6.25 mg by mouth 2 (two) times daily. 08/21/17  Yes [provider]  cloNIDine (CATAPRES - DOSED IN MG/24 HR) 0.1 mg/24hr patch Place 1 patch (0.1 mg total) onto the skin once a week. 01/16/18  Yes Kathie Dike, MD  clopidogrel (PLAVIX) 75 MG tablet Take 1 tablet (75 mg total) by mouth daily with breakfast. 01/11/18  Yes Kathie Dike, MD  famotidine (PEPCID) 20 MG tablet Take 20 mg by mouth daily.   Yes [provider]  fenofibrate (TRICOR) 145 MG tablet Take 145 mg by mouth daily.   Yes [provider]  haloperidol (HALDOL) 2 MG tablet Take 2 mg by mouth 3 (three) times daily.   Yes [provider]  LORazepam  (ATIVAN) 2 MG/ML injection Inject 2 mg into the vein once. Inject 1 mg intramuscularly one time for behaviors until 02/15/18   Yes [provider]  magnesium hydroxide (MILK OF MAGNESIA) 400 MG/5ML suspension Take 30 mLs by mouth daily as needed for mild constipation.   Yes [provider]  QUEtiapine (SEROQUEL) 200 MG tablet Take 200 mg by mouth every morning. Give 200 mg by mouth in the morning for agitation and anxiety   Yes [provider]  QUEtiapine (SEROQUEL) 400 MG tablet Take 400 mg by mouth at bedtime. Give 400 mg by mouth at bedtime for agitation and anxiety   Yes [provider]    Family History Family History  Problem Relation Age of Onset  .  Colon cancer Sister 17  . Cancer Mother   . Heart attack Father   . Hypertension Daughter   . Cancer Maternal Grandfather   . Diabetes Paternal Grandmother     Social History Social History   Tobacco Use  . Smoking status: Current Some Day Smoker    Packs/day: 0.10    Years: 40.00    Pack years: 4.00    Types: Cigarettes  . Smokeless tobacco: Never Used  Substance Use Topics  . Alcohol use: Not Currently    Alcohol/week: 0.0 standard drinks    Comment: rarely  . Drug use: Yes    Types: Marijuana, Cocaine    Comment: last used yesterday     Allergies   Sulfa antibiotics   Review of Systems Review of Systems  Unable to perform ROS: Mental status change     Physical Exam Updated Vital Signs BP 114/67   Pulse 91   Temp 97.9 F (36.6 C)   Resp 18   Ht 5' 1"  (1.549 m)   Wt 67.8 kg   SpO2 97%   BMI 28.24 kg/m   Physical Exam Vitals signs and nursing note reviewed.  Constitutional:      General: She is in acute distress.     Appearance: She is well-developed.  HENT:     Head: Normocephalic and atraumatic.     Comments: No obvious head injury. Eyes:     Pupils: Pupils are equal, round, and reactive to light.     Comments: Pupils are 3 mm bilaterally and reactive.    Neck:     Musculoskeletal: Normal range of motion and neck supple. No neck rigidity or muscular tenderness.     Comments: No meningismus Cardiovascular:     Rate and Rhythm: Normal rate and regular rhythm.  Pulmonary:     Effort: Pulmonary effort is normal.     Breath sounds: Normal breath sounds.  Abdominal:     General: Bowel sounds are normal.     Palpations: Abdomen is soft.     Tenderness: There is no abdominal tenderness. There is no guarding or rebound.  Musculoskeletal: Normal range of motion.        General: No tenderness.  Lymphadenopathy:     Cervical: No cervical adenopathy.  Skin:    General: Skin is warm and dry.     Findings: No erythema or rash.  Neurological:     Comments: Patient is mumbling.  She appears to move all extremities but she is not following commands.  Mild agitation.      ED Treatments / Results  Labs (all labs ordered are listed, but only abnormal results are displayed) Labs Reviewed  CBC WITH DIFFERENTIAL/PLATELET - Abnormal; Notable for the following components:      Result Value   WBC 18.8 (*)    RBC 3.24 (*)    Hemoglobin 9.0 (*)    HCT 28.7 (*)    Neutro Abs 14.6 (*)    Monocytes Absolute 1.8 (*)    Abs Immature Granulocytes 0.13 (*)    All other components within normal limits  COMPREHENSIVE METABOLIC PANEL - Abnormal; Notable for the following components:   Glucose, Bld 124 (*)    BUN 54 (*)    Creatinine, Ser 3.47 (*)    Calcium 8.3 (*)    Albumin 3.0 (*)    AST 184 (*)    ALT 74 (*)    GFR calc non Af Amer 13 (*)    GFR calc  Af Amer 15 (*)    All other components within normal limits  AMMONIA - Abnormal; Notable for the following components:   Ammonia 45 (*)    All other components within normal limits  URINALYSIS, ROUTINE W REFLEX MICROSCOPIC - Abnormal; Notable for the following components:   Color, Urine AMBER (*)    APPearance HAZY (*)    Glucose, UA 50 (*)    Protein, ur >=300 (*)    Bacteria, UA RARE (*)     All other components within normal limits  COMPREHENSIVE METABOLIC PANEL - Abnormal; Notable for the following components:   Sodium 134 (*)    BUN 53 (*)    Creatinine, Ser 3.83 (*)    Calcium 8.1 (*)    Albumin 2.8 (*)    AST 135 (*)    ALT 62 (*)    GFR calc non Af Amer 11 (*)    GFR calc Af Amer 13 (*)    All other components within normal limits  CBC WITH DIFFERENTIAL/PLATELET - Abnormal; Notable for the following components:   WBC 17.4 (*)    RBC 3.20 (*)    Hemoglobin 8.8 (*)    HCT 28.5 (*)    Neutro Abs 11.5 (*)    Monocytes Absolute 1.9 (*)    Abs Immature Granulocytes 0.09 (*)    All other components within normal limits  IRON AND TIBC - Abnormal; Notable for the following components:   Iron 26 (*)    Saturation Ratios 9 (*)    All other components within normal limits  FERRITIN - Abnormal; Notable for the following components:   Ferritin 455 (*)    All other components within normal limits  RETICULOCYTES - Abnormal; Notable for the following components:   RBC. 3.08 (*)    All other components within normal limits  CBC - Abnormal; Notable for the following components:   WBC 14.4 (*)    RBC 3.04 (*)    Hemoglobin 8.5 (*)    HCT 27.1 (*)    All other components within normal limits  COMPREHENSIVE METABOLIC PANEL - Abnormal; Notable for the following components:   CO2 16 (*)    Glucose, Bld 54 (*)    BUN 64 (*)    Creatinine, Ser 4.24 (*)    Calcium 7.9 (*)    Albumin 2.5 (*)    AST 87 (*)    ALT 45 (*)    Total Bilirubin 1.3 (*)    GFR calc non Af Amer 10 (*)    GFR calc Af Amer 12 (*)    All other components within normal limits  URINE CULTURE  CULTURE, BLOOD (ROUTINE X 2)  LIPASE, BLOOD  ETHANOL  RAPID URINE DRUG SCREEN, HOSP PERFORMED  TSH  VITAMIN B12  RPR  SODIUM, URINE, RANDOM  CREATININE, URINE, RANDOM  MAGNESIUM  FOLATE RBC  HIV ANTIBODY (ROUTINE TESTING W REFLEX)  CBC  COMPREHENSIVE METABOLIC PANEL    EKG EKG  Interpretation  Date/Time:  Saturday February 15 2018 21:44:24 EST Ventricular Rate:  90 PR Interval:    QRS Duration: 97 QT Interval:  416 QTC Calculation: 509 R Axis:   55 Text Interpretation:  Sinus rhythm Borderline prolonged PR interval T wave abnormality Abnormal ekg Confirmed by Carmin Muskrat [phone]) on 02/16/2018 8:37:31 PM   Radiology Ct Head Wo Contrast  Result Date: 02/16/2018 CLINICAL DATA:  Encephalopathy.  Increased confusion. EXAM: CT HEAD WITHOUT CONTRAST TECHNIQUE: Contiguous axial images were obtained from the  base of the skull through the vertex without intravenous contrast. COMPARISON:  Head CT 01/01/2018, brain MRI 12/23/2017 FINDINGS: Despite restraints and technologist directions and repeat apposition, there is moderate motion artifact, patient had difficulty tolerating the exam. Brain: Unchanged atrophy and chronic small vessel ischemia. No evidence of hydrocephalus. No acute intracranial hemorrhage allowing for motion. Left cerebellar infarct evaluation is limited by motion, no evidence of significant progression. Periventricular right frontal lobe infarcts are not as well seen currently. No gross evidence of acute ischemia, detailed evaluation limited. No midline shift or mass effect. Vascular: No obvious hyperdense vessel. Skull: No acute findings. Sinuses/Orbits: Grossly negative. Other: None. IMPRESSION: 1. Significant motion degradation. 2. No gross acute intracranial abnormality. Left cerebellar and right frontal infarcts on prior exam are partially obscured by motion, no evidence of significant progression. Generalized atrophy and chronic small vessel ischemia again seen. Electronically Signed   By: Keith Rake M.D.   On: 02/16/2018 03:31   US Abdomen Complete  Result Date: 02/16/2018 CLINICAL DATA:  Acute on chronic renal failure. Elevated liver function tests. EXAM: ABDOMEN ULTRASOUND COMPLETE COMPARISON:  CT abdomen 11/18/2016 FINDINGS: The patient was  combative and the patient actively resisted ultrasound. As result, a variety of areas normal included on abdominal ultrasound could not be obtained. Gallbladder: The gallbladder was not identified sonographically. It is possible that an existing gallbladder could be obscured by the heavy calcifications along the gallbladder wall shown on the prior CT from 2018. Common bile duct: Diameter: Could not be obtained. Liver: Nodular liver contour compatible with cirrhosis. No discrete focal mass. Portal vein is patent on color Doppler imaging with normal direction of blood flow towards the liver. IVC: Could not be obtained. Pancreas: Could not be obtained. Spleen: Could not be obtained. Right Kidney: Could not be obtained. Left Kidney: Length: Could not be obtained. Abdominal aorta: Could not be obtained. Other findings: None. IMPRESSION: 1. Nodular hepatic contour compatible with cirrhosis. No discrete focal mass is identified. Expected hepatopetal portal venous flow. 2. Gallbladder not well seen. It is possible that existing gallbladder could be obscured by the heavy calcifications shown on the prior CT from 2018. 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 3. The patient was combative and actively resisted sonography. As result, the following areas could not be observed: Common bile duct, IVC, pancreas, spleen, kidneys, and abdominal aorta. Electronically Signed   By: Van Clines M.D.   On: 02/16/2018 12:43   Dg Chest Port 1 View  Result Date: 02/17/2018 CLINICAL DATA:  68 y/o  F; NG tube. EXAM: PORTABLE CHEST 1 VIEW COMPARISON:  02/15/2018 chest radiograph FINDINGS: Normal cardiac silhouette. Enteric tube is coiling in the lower esophagus and tip appears to be projecting over the right mainstem bronchus in the right lower lung. Clear lungs. No pleural effusion or pneumothorax. No acute osseous abnormality is evident. IMPRESSION: Enteric tube tip projects  over the right lower lung in the airways, repositioning recommended. These results will be called to the ordering clinician or representative by the Radiologist Assistant, and communication documented in the PACS or zVision Dashboard. Electronically Signed   By: Kristine Garbe M.D.   On: 02/17/2018 17:35   Dg Chest Port 1 View  Result Date: 02/15/2018 CLINICAL DATA:  Disorientation. EXAM: PORTABLE CHEST 1 VIEW COMPARISON:  12/28/2017 FINDINGS: Calcific atherosclerotic disease of the aorta. There is no evidence of focal airspace consolidation, pleural effusion or pneumothorax. Low lung volumes. Osseous structures are without acute abnormality.  Soft tissues are grossly normal. IMPRESSION: No active disease. Electronically Signed   By: Fidela Salisbury M.D.   On: 02/15/2018 23:17   Dg Chest Port 1v Same Day  Result Date: 02/17/2018 CLINICAL DATA:  Nasogastric tube placement. EXAM: PORTABLE CHEST 1 VIEW COMPARISON:  Chest x-ray from same day at 1653. FINDINGS: Nasogastric tube seen coiled within the stomach, with the tip below the field of view. Stable cardiomediastinal silhouette. Normal pulmonary vascularity. No focal consolidation, pleural effusion, or pneumothorax. No acute osseous abnormality. IMPRESSION: Enteric tube coiled within the stomach with the tip below the field of view. Electronically Signed   By: Titus Dubin M.D.   On: 02/17/2018 18:35    Procedures Procedures (including critical care time)  Medications Ordered in ED Medications  aspirin tablet 325 mg (325 mg Oral Not Given 02/17/18 0940)  QUEtiapine (SEROQUEL) tablet 200 mg (200 mg Oral Given 02/16/18 2008)  QUEtiapine (SEROQUEL) tablet 400 mg (400 mg Oral Given 02/16/18 2006)  clopidogrel (PLAVIX) tablet 75 mg (75 mg Oral Not Given 02/17/18 0939)  heparin injection 5,000 Units (5,000 Units Subcutaneous Given 02/17/18 1324)  0.9 %  sodium chloride infusion (has no administration in time range)  acetaminophen (TYLENOL)  tablet 650 mg (650 mg Oral Given 02/16/18 2007)    Or  acetaminophen (TYLENOL) suppository 650 mg ( Rectal See Alternative 02/16/18 2007)  polyethylene glycol (MIRALAX / GLYCOLAX) packet 17 g (has no administration in time range)  lactulose (CHRONULAC) 10 GM/15ML solution 30 g (30 g Per Tube Given 02/17/18 1845)  sodium bicarbonate 100 mEq in dextrose 5 % 1,000 mL infusion ( Intravenous New Bag/Given 02/17/18 1926)  sodium chloride 0.9 % bolus 500 mL (0 mLs Intravenous Stopped 02/16/18 0002)  sodium chloride 0.9 % bolus 500 mL (0 mLs Intravenous Stopped 02/16/18 0002)  LORazepam (ATIVAN) injection 0.5 mg (0.5 mg Intravenous Given 02/16/18 2225)  lactulose (CHRONULAC) enema 200 gm (300 mLs Rectal Given 02/17/18 1425)    CRITICAL CARE Performed by: Julianne Rice Total critical care time:25 minutes Critical care time was exclusive of separately billable procedures and treating other patients. Critical care was necessary to treat or prevent imminent or life-threatening deterioration. Critical care was time spent personally by me on the following activities: development of treatment plan with patient and/or surrogate as well as nursing, discussions with consultants, evaluation of patient's response to treatment, examination of patient, obtaining history from patient or surrogate, ordering and performing treatments and interventions, ordering and review of laboratory studies, ordering and review of radiographic studies, pulse oximetry and re-evaluation of patient's condition. Initial Impression / Assessment and Plan / ED Course  I have reviewed the triage vital signs and the nursing notes.  Pertinent labs & imaging results that were available during my care of the patient were reviewed by me and considered in my medical decision making (see chart for details).     Patient with increased confusion.  Initially hypotensive but improved with fluid bolus.  Evidence of acute kidney injury.  Leukocytosis  without definite evidence of infectious process.  Discussed with hospitalist who will see patient emergency department and admit. Final Clinical Impressions(s) / ED Diagnoses   Final diagnoses:  SIRS (systemic inflammatory response syndrome) (HCC)  Acute encephalopathy  Elevated LFTs  Acute renal failure superimposed on stage 3 chronic kidney disease ([place])  AKI (acute kidney injury) ([place])  Encounter for nasogastric (NG) tube placement    ED Discharge Orders    None       Julianne Rice,  MD 02/17/18 2040

## 2018-02-15 NOTE — ED Triage Notes (Signed)
[facility] called out for AMS. Upon arrival to [facility] in [place] pt was lying in bed naked. Pt able to state name and birthday, otherwise disoriented. Pt has hx of dementia with behavioral disturbances. EMS reports nurse thinks maybe UTI. Ativan 1 mg given by facility around 1900 b/c was agitated and undressing, somewhat violent. Pt is calmer at this point.

## 2018-02-16 ENCOUNTER — Observation Stay (HOSPITAL_COMMUNITY): Payer: Medicare Other

## 2018-02-16 DIAGNOSIS — N183 Chronic kidney disease, stage 3 (moderate): Secondary | ICD-10-CM | POA: Diagnosis not present

## 2018-02-16 DIAGNOSIS — N179 Acute kidney failure, unspecified: Secondary | ICD-10-CM | POA: Diagnosis not present

## 2018-02-16 DIAGNOSIS — I10 Essential (primary) hypertension: Secondary | ICD-10-CM | POA: Diagnosis not present

## 2018-02-16 DIAGNOSIS — R945 Abnormal results of liver function studies: Secondary | ICD-10-CM | POA: Diagnosis not present

## 2018-02-16 LAB — COMPREHENSIVE METABOLIC PANEL
ALK PHOS: 54 U/L (ref 38–126)
ALT: 62 U/L — ABNORMAL HIGH (ref 0–44)
AST: 135 U/L — ABNORMAL HIGH (ref 15–41)
Albumin: 2.8 g/dL — ABNORMAL LOW (ref 3.5–5.0)
Anion gap: 9 (ref 5–15)
BUN: 53 mg/dL — ABNORMAL HIGH (ref 8–23)
CO2: 25 mmol/L (ref 22–32)
Calcium: 8.1 mg/dL — ABNORMAL LOW (ref 8.9–10.3)
Chloride: 100 mmol/L (ref 98–111)
Creatinine, Ser: 3.83 mg/dL — ABNORMAL HIGH (ref 0.44–1.00)
GFR calc Af Amer: 13 mL/min — ABNORMAL LOW (ref >60)
GFR calc non Af Amer: 11 mL/min — ABNORMAL LOW (ref >60)
GLUCOSE: 92 mg/dL (ref 70–99)
Potassium: 4.1 mmol/L (ref 3.5–5.1)
Sodium: 134 mmol/L — ABNORMAL LOW (ref 135–145)
Total Bilirubin: 0.6 mg/dL (ref 0.3–1.2)
Total Protein: 6.5 g/dL (ref 6.5–8.1)

## 2018-02-16 LAB — RETICULOCYTES
Immature Retic Fract: 7 % (ref 2.3–15.9)
RBC.: 3.08 MIL/uL — ABNORMAL LOW (ref 3.87–5.11)
Retic Count, Absolute: 22.8 10*3/uL (ref 19.0–186.0)
Retic Ct Pct: 0.7 % (ref 0.4–3.1)

## 2018-02-16 LAB — IRON AND TIBC
IRON: 26 ug/dL — AB (ref 28–170)
Saturation Ratios: 9 % — ABNORMAL LOW (ref 10.4–31.8)
TIBC: 298 ug/dL (ref 250–450)
UIBC: 272 ug/dL

## 2018-02-16 LAB — CBC WITH DIFFERENTIAL/PLATELET
Abs Immature Granulocytes: 0.09 10*3/uL — ABNORMAL HIGH (ref 0.00–0.07)
BASOS ABS: 0.1 10*3/uL (ref 0.0–0.1)
Basophils Relative: 0 %
Eosinophils Absolute: 0.1 10*3/uL (ref 0.0–0.5)
Eosinophils Relative: 1 %
HCT: 28.5 % — ABNORMAL LOW (ref 36.0–46.0)
Hemoglobin: 8.8 g/dL — ABNORMAL LOW (ref 12.0–15.0)
IMMATURE GRANULOCYTES: 1 %
Lymphocytes Relative: 22 %
Lymphs Abs: 3.8 10*3/uL (ref 0.7–4.0)
MCH: 27.5 pg (ref 26.0–34.0)
MCHC: 30.9 g/dL (ref 30.0–36.0)
MCV: 89.1 fL (ref 80.0–100.0)
Monocytes Absolute: 1.9 10*3/uL — ABNORMAL HIGH (ref 0.1–1.0)
Monocytes Relative: 11 %
Neutro Abs: 11.5 10*3/uL — ABNORMAL HIGH (ref 1.7–7.7)
Neutrophils Relative %: 65 %
Platelets: 222 10*3/uL (ref 150–400)
RBC: 3.2 MIL/uL — ABNORMAL LOW (ref 3.87–5.11)
RDW: 15 % (ref 11.5–15.5)
WBC: 17.4 10*3/uL — ABNORMAL HIGH (ref 4.0–10.5)
nRBC: 0 % (ref 0.0–0.2)

## 2018-02-16 LAB — TSH: TSH: 0.734 u[IU]/mL (ref 0.350–4.500)

## 2018-02-16 LAB — MAGNESIUM: Magnesium: 2.4 mg/dL (ref 1.7–2.4)

## 2018-02-16 LAB — VITAMIN B12: Vitamin B-12: 217 pg/mL (ref 180–914)

## 2018-02-16 LAB — FERRITIN: Ferritin: 455 ng/mL — ABNORMAL HIGH (ref 11–307)

## 2018-02-16 MED ORDER — LORAZEPAM 2 MG/ML IJ SOLN
0.5000 mg | Freq: Once | INTRAMUSCULAR | Status: AC
  Administered 2018-02-16: 0.5 mg via INTRAVENOUS
  Filled 2018-02-16: qty 1

## 2018-02-16 NOTE — ED Notes (Signed)
Seizure pads placed and pt repositioned; attempted to obtain BP and pt yelling out and hitting at staff; will attempt later

## 2018-02-16 NOTE — Progress Notes (Signed)
Patient Demographics:    Cassandra Jordan, is a 68 y.o. female, DOB - 25-Nov-1950, KJZ:[id number]  Admit date - 02/15/2018   Admitting Physician Vianne Bulls, MD  Outpatient Primary MD for the patient is Abran Richard, MD  LOS - 0   Chief Complaint  Patient presents with  . Altered Mental Status        Subjective:    Cassandra Jordan today has no fevers, no emesis,  No chest pain, remains confused, disoriented,  Assessment  & Plan :    Principal Problem:   Acute renal failure superimposed on stage 3 chronic kidney disease (HCC) Active Problems:   Elevated LFTs   Essential hypertension   Leukocytosis   Prolonged QT interval   Chronic combined systolic and diastolic CHF (congestive heart failure) (HCC)   History of CVA in adulthood  Brief Summary 68 y.o. female with medical history significant for hepatitis C with cirrhosis, cocaine abuse, hypertension, chronic kidney disease stage III, carotid artery stenosis, and recent admission for acute CVA readmitted on 9/48/0165 with metabolic encephalopathy, please note the patient is currently scheduled for right carotid endarterectomy in about a week   Plan:- 1)AKI----acute kidney injury on CKD stage -     creatinine on admission= 3.47  ,   baseline creatinine = 1.48 (12/2017)   , creatinine is now= 3.83      , renally adjust medications, avoid nephrotoxic agents/dehydration/hypotension, will request nephrology consult in a.m.  2) acute metabolic encephalopathy--- suspect this may be related to worsening renal function, need to rule out   Infection,  patient with increasing confusion and agitation, ammonia only slightly elevated, leukocytosis persist (WBC 17.4 , was 18.8 on admission) without fevers, UA not impressive from a UTI standpoint, without new acute findings prior strokes noted  3)Anemia--- serum iron is 26 with iron saturation of 9, and is elevated  at 455 baseline acute phase reactant  4) alcoholic liver cirrhosis--AST to ALT ratio is > 2:1, serum ammonia is 45 which is lower than patient's prior baseline  5)H/o Recent CVA and carotid artery stenosis/PAD--- continue aspirin and Plavix, statin on hold due to elevated LFTs, patient was previously scheduled for right carotid endarterectomy in about a week or so  Disposition/Need for in-Hospital Stay- patient unable to be discharged at this time due to worsening renal function  Code Status : Full  Family Communication:   na   Disposition Plan  : SNF Rehab  Consults  :  Nephrology  DVT Prophylaxis  :    - Heparin -    Lab Results  Component Value Date   PLT 222 02/16/2018    Inpatient Medications  Scheduled Meds: . aspirin  325 mg Oral Daily  . clopidogrel  75 mg Oral Q breakfast  . haloperidol  2 mg Oral TID  . heparin  5,000 Units Subcutaneous Q8H  . QUEtiapine  200 mg Oral QHS  . QUEtiapine  400 mg Oral QHS   Continuous Infusions: PRN Meds:.acetaminophen **OR** acetaminophen, polyethylene glycol    Anti-infectives (From admission, onward)   None        Objective:   Vitals:   02/16/18 0532 02/16/18 0535 02/16/18 0829 02/16/18 1348  BP: (!) 144/127  (!) 115/93 127/62  Pulse:  91 73 88  Resp:   20 16  Temp:   98.8 F (37.1 C) 99.1 F (37.3 C)  TempSrc:   Oral   SpO2:  90% 92% 91%  Weight:   66.5 kg   Height:   5' 1"  (1.549 m)     Wt Readings from Last 3 Encounters:  02/16/18 66.5 kg  02/03/18 59 kg  01/06/18 64.6 kg     Intake/Output Summary (Last 24 hours) at 02/16/2018 1644 Last data filed at 02/16/2018 1300 Gross per 24 hour  Intake 1000 ml  Output -  Net 1000 ml     Physical Exam Patient is examined daily including today on 02/16/18 , exams remain the same as of yesterday except that has changed   Gen:- Awake, grimacing intermittently confused and disoriented  HEENT:- [place].AT, No sclera icterus Neck-Supple Neck,No JVD,.  Lungs-  CTAB ,  fair symmetrical air movement CV- S1, S2 normal, regular  Abd-  +ve B.Sounds, Abd Soft, No tenderness,    Extremity/Skin:- No  edema, pedal pulses present  NeuroPsych-generalized weakness, confused and disoriented, not very cooperative   Data Review:   Micro Results No results found for this or any previous visit (from the past 240 hour(s)).  Radiology Reports Ct Head Wo Contrast  Result Date: 02/16/2018 CLINICAL DATA:  Encephalopathy.  Increased confusion. EXAM: CT HEAD WITHOUT CONTRAST TECHNIQUE: Contiguous axial images were obtained from the base of the skull through the vertex without intravenous contrast. COMPARISON:  Head CT 01/01/2018, brain MRI 12/23/2017 FINDINGS: Despite restraints and technologist directions and repeat apposition, there is moderate motion artifact, patient had difficulty tolerating the exam. Brain: Unchanged atrophy and chronic small vessel ischemia. No evidence of hydrocephalus. No acute intracranial hemorrhage allowing for motion. Left cerebellar infarct evaluation is limited by motion, no evidence of significant progression. Periventricular right frontal lobe infarcts are not as well seen currently. No gross evidence of acute ischemia, detailed evaluation limited. No midline shift or mass effect. Vascular: No obvious hyperdense vessel. Skull: No acute findings. Sinuses/Orbits: Grossly negative. Other: None. IMPRESSION: 1. Significant motion degradation. 2. No gross acute intracranial abnormality. Left cerebellar and right frontal infarcts on prior exam are partially obscured by motion, no evidence of significant progression. Generalized atrophy and chronic small vessel ischemia again seen. Electronically Signed   By: Keith Rake M.D.   On: 02/16/2018 03:31   US Abdomen Complete  Result Date: 02/16/2018 CLINICAL DATA:  Acute on chronic renal failure. Elevated liver function tests. EXAM: ABDOMEN ULTRASOUND COMPLETE COMPARISON:  CT abdomen 11/18/2016 FINDINGS: The  patient was combative and the patient actively resisted ultrasound. As result, a variety of areas normal included on abdominal ultrasound could not be obtained. Gallbladder: The gallbladder was not identified sonographically. It is possible that an existing gallbladder could be obscured by the heavy calcifications along the gallbladder wall shown on the prior CT from 2018. Common bile duct: Diameter: Could not be obtained. Liver: Nodular liver contour compatible with cirrhosis. No discrete focal mass. Portal vein is patent on color Doppler imaging with normal direction of blood flow towards the liver. IVC: Could not be obtained. Pancreas: Could not be obtained. Spleen: Could not be obtained. Right Kidney: Could not be obtained. Left Kidney: Length: Could not be obtained. Abdominal aorta: Could not be obtained. Other findings: None. IMPRESSION: 1. Nodular hepatic contour compatible with cirrhosis. No discrete focal mass is identified. Expected hepatopetal portal venous flow. 2. Gallbladder not well seen. It is possible that existing gallbladder could be  obscured by the heavy calcifications shown on the prior CT from 2018. 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 3. The patient was combative and actively resisted sonography. As result, the following areas could not be observed: Common bile duct, IVC, pancreas, spleen, kidneys, and abdominal aorta. Electronically Signed   By: Van Clines M.D.   On: 02/16/2018 12:43   Dg Chest Port 1 View  Result Date: 02/15/2018 CLINICAL DATA:  Disorientation. EXAM: PORTABLE CHEST 1 VIEW COMPARISON:  12/28/2017 FINDINGS: Calcific atherosclerotic disease of the aorta. There is no evidence of focal airspace consolidation, pleural effusion or pneumothorax. Low lung volumes. Osseous structures are without acute abnormality. Soft tissues are grossly normal. IMPRESSION: No active disease. Electronically Signed   By: Fidela Salisbury M.D.   On: 02/15/2018 23:17     CBC Recent Labs  Lab 02/15/18 2115 02/16/18 0516  WBC 18.8* 17.4*  HGB 9.0* 8.8*  HCT 28.7* 28.5*  PLT 225 222  MCV 88.6 89.1  MCH 27.8 27.5  MCHC 31.4 30.9  RDW 15.1 15.0  LYMPHSABS 2.3 3.8  MONOABS 1.8* 1.9*  EOSABS 0.0 0.1  BASOSABS 0.0 0.1    Chemistries  Recent Labs  Lab 02/15/18 2115 02/16/18 0516  NA 135 134*  K 4.7 4.1  CL 101 100  CO2 24 25  GLUCOSE 124* 92  BUN 54* 53*  CREATININE 3.47* 3.83*  CALCIUM 8.3* 8.1*  MG  --  2.4  AST 184* 135*  ALT 74* 62*  ALKPHOS 63 54  BILITOT 1.0 0.6   ------------------------------------------------------------------------------------------------------------------ No results for input(s): CHOL, HDL, LDLCALC, TRIG, CHOLHDL, LDLDIRECT in the last 72 hours.  Lab Results  Component Value Date   HGBA1C 6.7 (H) 12/23/2017   ------------------------------------------------------------------------------------------------------------------ Recent Labs    02/16/18 0516  TSH 0.734   ------------------------------------------------------------------------------------------------------------------ Recent Labs    02/16/18 0516  VITAMINB12 217  FERRITIN 455*  TIBC 298  IRON 26*  RETICCTPCT 0.7    Coagulation profile No results for input(s): INR, PROTIME in the last 168 hours.  No results for input(s): DDIMER in the last 72 hours.  Cardiac Enzymes No results for input(s): CKMB, TROPONINI, MYOGLOBIN in the last 168 hours.  Invalid input(s): CK ------------------------------------------------------------------------------------------------------------------ No results found for: BNP   Roxan Hockey M.D on 02/16/2018 at 4:44 PM  Go to [email or web address] - for contact info  [facility] - Office  [phone]

## 2018-02-16 NOTE — ED Notes (Signed)
Pt lying in bed, still trying to climb out of the bed, but pt is calmer

## 2018-02-16 NOTE — ED Notes (Signed)
Report given to [facility] on 300

## 2018-02-16 NOTE — ED Notes (Signed)
Attempted to get BP, pt unable to stay still after multiple attempts

## 2018-02-17 ENCOUNTER — Inpatient Hospital Stay (HOSPITAL_COMMUNITY): Payer: Medicare Other

## 2018-02-17 ENCOUNTER — Telehealth: Payer: Self-pay | Admitting: *Deleted

## 2018-02-17 DIAGNOSIS — E869 Volume depletion, unspecified: Secondary | ICD-10-CM | POA: Diagnosis present

## 2018-02-17 DIAGNOSIS — R945 Abnormal results of liver function studies: Secondary | ICD-10-CM | POA: Diagnosis not present

## 2018-02-17 DIAGNOSIS — R410 Disorientation, unspecified: Secondary | ICD-10-CM | POA: Diagnosis present

## 2018-02-17 DIAGNOSIS — K7290 Hepatic failure, unspecified without coma: Secondary | ICD-10-CM | POA: Diagnosis present

## 2018-02-17 DIAGNOSIS — F05 Delirium due to known physiological condition: Secondary | ICD-10-CM | POA: Diagnosis present

## 2018-02-17 DIAGNOSIS — E1122 Type 2 diabetes mellitus with diabetic chronic kidney disease: Secondary | ICD-10-CM | POA: Diagnosis present

## 2018-02-17 DIAGNOSIS — N179 Acute kidney failure, unspecified: Secondary | ICD-10-CM | POA: Diagnosis present

## 2018-02-17 DIAGNOSIS — J45909 Unspecified asthma, uncomplicated: Secondary | ICD-10-CM | POA: Diagnosis present

## 2018-02-17 DIAGNOSIS — K219 Gastro-esophageal reflux disease without esophagitis: Secondary | ICD-10-CM | POA: Diagnosis present

## 2018-02-17 DIAGNOSIS — E872 Acidosis: Secondary | ICD-10-CM | POA: Diagnosis present

## 2018-02-17 DIAGNOSIS — E860 Dehydration: Secondary | ICD-10-CM | POA: Diagnosis present

## 2018-02-17 DIAGNOSIS — B182 Chronic viral hepatitis C: Secondary | ICD-10-CM | POA: Diagnosis present

## 2018-02-17 DIAGNOSIS — I10 Essential (primary) hypertension: Secondary | ICD-10-CM | POA: Diagnosis not present

## 2018-02-17 DIAGNOSIS — J690 Pneumonitis due to inhalation of food and vomit: Secondary | ICD-10-CM | POA: Diagnosis not present

## 2018-02-17 DIAGNOSIS — G9341 Metabolic encephalopathy: Secondary | ICD-10-CM | POA: Diagnosis present

## 2018-02-17 DIAGNOSIS — D631 Anemia in chronic kidney disease: Secondary | ICD-10-CM | POA: Diagnosis present

## 2018-02-17 DIAGNOSIS — I959 Hypotension, unspecified: Secondary | ICD-10-CM | POA: Diagnosis present

## 2018-02-17 DIAGNOSIS — I2510 Atherosclerotic heart disease of native coronary artery without angina pectoris: Secondary | ICD-10-CM | POA: Diagnosis present

## 2018-02-17 DIAGNOSIS — N183 Chronic kidney disease, stage 3 (moderate): Secondary | ICD-10-CM | POA: Diagnosis present

## 2018-02-17 DIAGNOSIS — K7682 Hepatic encephalopathy: Secondary | ICD-10-CM | POA: Diagnosis present

## 2018-02-17 DIAGNOSIS — K7030 Alcoholic cirrhosis of liver without ascites: Secondary | ICD-10-CM | POA: Diagnosis present

## 2018-02-17 DIAGNOSIS — E785 Hyperlipidemia, unspecified: Secondary | ICD-10-CM | POA: Diagnosis present

## 2018-02-17 DIAGNOSIS — I5042 Chronic combined systolic (congestive) and diastolic (congestive) heart failure: Secondary | ICD-10-CM | POA: Diagnosis present

## 2018-02-17 DIAGNOSIS — E11649 Type 2 diabetes mellitus with hypoglycemia without coma: Secondary | ICD-10-CM | POA: Diagnosis not present

## 2018-02-17 DIAGNOSIS — R809 Proteinuria, unspecified: Secondary | ICD-10-CM | POA: Diagnosis present

## 2018-02-17 DIAGNOSIS — I130 Hypertensive heart and chronic kidney disease with heart failure and stage 1 through stage 4 chronic kidney disease, or unspecified chronic kidney disease: Secondary | ICD-10-CM | POA: Diagnosis present

## 2018-02-17 DIAGNOSIS — I6521 Occlusion and stenosis of right carotid artery: Secondary | ICD-10-CM | POA: Diagnosis present

## 2018-02-17 DIAGNOSIS — Z66 Do not resuscitate: Secondary | ICD-10-CM | POA: Diagnosis present

## 2018-02-17 LAB — URINE CULTURE: Culture: NO GROWTH

## 2018-02-17 LAB — COMPREHENSIVE METABOLIC PANEL
ALT: 45 U/L — ABNORMAL HIGH (ref 0–44)
ANION GAP: 15 (ref 5–15)
AST: 87 U/L — ABNORMAL HIGH (ref 15–41)
Albumin: 2.5 g/dL — ABNORMAL LOW (ref 3.5–5.0)
Alkaline Phosphatase: 58 U/L (ref 38–126)
BUN: 64 mg/dL — ABNORMAL HIGH (ref 8–23)
CO2: 16 mmol/L — ABNORMAL LOW (ref 22–32)
Calcium: 7.9 mg/dL — ABNORMAL LOW (ref 8.9–10.3)
Chloride: 109 mmol/L (ref 98–111)
Creatinine, Ser: 4.24 mg/dL — ABNORMAL HIGH (ref 0.44–1.00)
GFR calc Af Amer: 12 mL/min — ABNORMAL LOW (ref >60)
GFR calc non Af Amer: 10 mL/min — ABNORMAL LOW (ref >60)
Glucose, Bld: 54 mg/dL — ABNORMAL LOW (ref 70–99)
Potassium: 4.3 mmol/L (ref 3.5–5.1)
SODIUM: 140 mmol/L (ref 135–145)
Total Bilirubin: 1.3 mg/dL — ABNORMAL HIGH (ref 0.3–1.2)
Total Protein: 6.5 g/dL (ref 6.5–8.1)

## 2018-02-17 LAB — CBC
HCT: 27.1 % — ABNORMAL LOW (ref 36.0–46.0)
Hemoglobin: 8.5 g/dL — ABNORMAL LOW (ref 12.0–15.0)
MCH: 28 pg (ref 26.0–34.0)
MCHC: 31.4 g/dL (ref 30.0–36.0)
MCV: 89.1 fL (ref 80.0–100.0)
PLATELETS: 213 10*3/uL (ref 150–400)
RBC: 3.04 MIL/uL — ABNORMAL LOW (ref 3.87–5.11)
RDW: 14.9 % (ref 11.5–15.5)
WBC: 14.4 10*3/uL — ABNORMAL HIGH (ref 4.0–10.5)
nRBC: 0 % (ref 0.0–0.2)

## 2018-02-17 LAB — CREATININE, URINE, RANDOM: Creatinine, Urine: 59.47 mg/dL

## 2018-02-17 LAB — RPR: RPR: NONREACTIVE

## 2018-02-17 LAB — SODIUM, URINE, RANDOM: SODIUM UR: 51 mmol/L

## 2018-02-17 MED ORDER — LACTULOSE ENEMA
300.0000 mL | Freq: Once | ORAL | Status: AC
  Administered 2018-02-17: 300 mL via RECTAL
  Filled 2018-02-17: qty 300

## 2018-02-17 MED ORDER — SODIUM BICARBONATE 8.4 % IV SOLN
INTRAVENOUS | Status: AC
  Filled 2018-02-17: qty 100

## 2018-02-17 MED ORDER — SODIUM BICARBONATE 8.4 % IV SOLN
INTRAVENOUS | Status: DC
  Administered 2018-02-17 – 2018-02-18 (×2): via INTRAVENOUS
  Filled 2018-02-17 (×6): qty 100

## 2018-02-17 MED ORDER — SODIUM CHLORIDE 0.9 % IV SOLN
INTRAVENOUS | Status: DC
  Administered 2018-02-17: 11:00:00 via INTRAVENOUS

## 2018-02-17 MED ORDER — LACTULOSE 10 GM/15ML PO SOLN
30.0000 g | Freq: Three times a day (TID) | ORAL | Status: DC
  Administered 2018-02-17 – 2018-02-18 (×5): 30 g
  Filled 2018-02-17 (×5): qty 60

## 2018-02-17 NOTE — NC FL2 (Signed)
[place] MEDICAID FL2 LEVEL OF CARE SCREENING TOOL     IDENTIFICATION  Patient Name: Cassandra Jordan Birthdate: 1950/11/04 Sex: female Admission Date (Current Location): 02/15/2018  [facility] [place] Number:      Facility and Address:  [facility] [address] [place]      Provider Number: [id number]  Attending Physician Name and Address:  Roxan Hockey, MD  Relative Name and Phone Number:       Current Level of Care: Hospital Recommended Level of Care: [place], Memory Care Prior Approval Number:    Date Approved/Denied:   PASRR Number:    Discharge Plan: SNF    Current Diagnoses: Patient Active Problem List   Diagnosis Date Noted  . Hepatic encephalopathy ([place]) 02/17/2018  . Chronic combined systolic and diastolic CHF (congestive heart failure) ([place]) 02/15/2018  . History of CVA in adulthood 02/15/2018  . Acute encephalopathy   . Advanced care planning/counseling discussion   . Palliative care by specialist   . Cerebellar stroke ([place])   . Prolonged QT interval 12/23/2017  . Hyperphosphatemia 05/22/2017  . Acute CVA (cerebrovascular accident) ([place]) 05/21/2017  . Abdominal pain 11/18/2016  . Cholelithiasis 11/18/2016  . Uncontrolled type 2 diabetes mellitus with hyperglycemia, with long-term current use of insulin ([place]) 11/18/2016  . CKD (chronic kidney disease), stage III ([place]) 11/18/2016  . Acute pancreatitis   . Ileus ([place])   . Porcelain gallbladder   . Acute renal failure superimposed on stage 3 chronic kidney disease ([place]) 11/04/2016  . Hypokalemia 11/01/2016  . Hypomagnesemia 11/01/2016  . SBO (small bowel obstruction) ([place])   . AKI (acute kidney injury) ([place]) 10/30/2016  . Leukocytosis 10/30/2016  . GERD (gastroesophageal reflux disease) 10/29/2016  . Small bowel obstruction ([place]) 10/29/2016  . History of drug use 07/17/2016  . Constipation 05/30/2016  . Fecal incontinence 01/04/2016  . Hepatic  cirrhosis ([place]) 04/08/2015  . History of adenomatous polyp of colon   . History of colonic polyps   . Hx of adenomatous colonic polyps 12/10/2014  . Lichen sclerosus et atrophicus 10/13/2014  . Vaginal itching 10/01/2014  . Vaginal irritation 10/01/2014  . Yeast infection of the vagina 10/01/2014  . Blurred vision, right eye 03/20/2014  . Cocaine abuse ([place]) 03/20/2014  . Generalized weakness 03/20/2014  . Chest pain 02/10/2014  . Chest pain at rest 02/10/2014  . Type 2 diabetes mellitus ([place])   . Diabetes mellitus, type II ([place])   . Tobacco abuse   . PSVT (paroxysmal supraventricular tachycardia) ([place]) 11/04/2013  . Essential hypertension 10/16/2013  . Renal mass 10/14/2013  . Ovarian mass, left 08/17/2013  . Hepatitis C, chronic ([place]) 07/01/2013  . Elevated LFTs 07/01/2013  . Other dysphagia 07/01/2013  . Goals of care, counseling/discussion 07/01/2013    Orientation RESPIRATION BLADDER Height & Weight        Normal Incontinent Weight: 149 lb 7.6 oz (67.8 kg) Height:  5' 1"  (154.9 cm)  BEHAVIORAL SYMPTOMS/MOOD NEUROLOGICAL BOWEL NUTRITION STATUS      Incontinent Diet(heart healthy)  AMBULATORY STATUS COMMUNICATION OF NEEDS Skin   Limited Assist Verbally Normal                       Personal Care Assistance Level of Assistance  Bathing, Feeding, Dressing Bathing Assistance: Maximum assistance Feeding assistance: Limited assistance Dressing Assistance: Maximum assistance     Functional Limitations Info  Sight, Hearing, Speech Sight Info: Adequate Hearing Info: Adequate Speech Info: Adequate  SPECIAL CARE FACTORS FREQUENCY                       Contractures Contractures Info: Not present    Additional Factors Info  Code Status, Allergies, Psychotropic Code Status Info: DNR Allergies Info: Sulfa Antibiotics Psychotropic Info: Haldol, Seroquel         Current Medications (02/17/2018):  This is the current hospital active medication  list Current Facility-Administered Medications  Medication Dose Route Frequency Provider Last Rate Last Dose  . 0.9 %  sodium chloride infusion   Intravenous Continuous Emokpae, Courage, MD 75 mL/hr at 02/17/18 1051    . acetaminophen (TYLENOL) tablet 650 mg  650 mg Oral Q6H PRN Opyd, Ilene Qua, MD   650 mg at 02/16/18 2007   Or  . acetaminophen (TYLENOL) suppository 650 mg  650 mg Rectal Q6H PRN Opyd, Ilene Qua, MD      . aspirin tablet 325 mg  325 mg Oral Daily Opyd, Ilene Qua, MD   325 mg at 02/16/18 1047  . clopidogrel (PLAVIX) tablet 75 mg  75 mg Oral Q breakfast Opyd, Ilene Qua, MD   75 mg at 02/16/18 1047  . heparin injection 5,000 Units  5,000 Units Subcutaneous Q8H Opyd, Ilene Qua, MD   5,000 Units at 02/17/18 1324  . lactulose (CHRONULAC) 10 GM/15ML solution 30 g  30 g Per Tube TID Emokpae, Courage, MD      . polyethylene glycol (MIRALAX / GLYCOLAX) packet 17 g  17 g Oral Daily PRN Opyd, Ilene Qua, MD      . QUEtiapine (SEROQUEL) tablet 200 mg  200 mg Oral QHS Opyd, Ilene Qua, MD   200 mg at 02/16/18 2008  . QUEtiapine (SEROQUEL) tablet 400 mg  400 mg Oral QHS Opyd, Ilene Qua, MD   400 mg at 02/16/18 2006     Discharge Medications: Please see discharge summary for a list of discharge medications.  Relevant Imaging Results:  Relevant Lab Results:   Additional Information SSN: [id number]  Kelcy Baeten, Clydene Pugh, LCSW

## 2018-02-17 NOTE — Progress Notes (Signed)
Dr. Denton Brick made aware patient is too lethargic to take morning PO medications.  Patient was given Ativan during the night of 01/26 and has been lethargic ever since.  Patient does awake to sternal rub but then falls right back to sleep.

## 2018-02-17 NOTE — Progress Notes (Signed)
  Phone Conference with patient's daughter Ms. Lemmie Evens--- RN Evalyn Casco participated via Speaker Phone)..  Daughter request DNR/DNI status No heroic measures As per Daughter---Okay to place NG tube for lactulose administration, okay to use wrist restraints to prevent patient from pulling tubes out if she does pull out her tubes  Roxan Hockey, MD

## 2018-02-17 NOTE — Progress Notes (Signed)
Patient Demographics:    Cassandra Jordan, is a 68 y.o. female, DOB - 1950/09/21, DJS:[id number]  Admit date - 02/15/2018   Admitting Physician Vianne Bulls, MD  Outpatient Primary MD for the patient is Abran Richard, MD  LOS - 0   Chief Complaint  Patient presents with  . Altered Mental Status        Subjective:    Cassandra Jordan today has no fevers, no emesis,  No chest pain, remains confused, disoriented, called and discussed patient's overall health and plan of care with patient's daughter  Assessment  & Plan :    Principal Problem:   Acute renal failure superimposed on stage 3 chronic kidney disease ([place]) Active Problems:   Elevated LFTs   Essential hypertension   Leukocytosis   Prolonged QT interval   Chronic combined systolic and diastolic CHF (congestive heart failure) ([place])   History of CVA in adulthood   Hepatic encephalopathy ([place])  Brief Summary 68 y.o. female with medical history significant for hepatitis C with cirrhosis, cocaine abuse, hypertension, chronic kidney disease stage III, carotid artery stenosis, and recent admission for acute CVA readmitted on 8/85/0277 with metabolic encephalopathy, please note the patient is currently scheduled for right carotid endarterectomy in about a week   Plan:- 1)AKI----acute kidney injury on CKD stage - III    creatinine on admission= 3.47  ,   baseline creatinine = 1.48 (12/2017)   , creatinine is now= 4.24 (peak) with a BUN of 64, bicarb is down to 16     , please note that patient only has 1 solitary kidney , renally adjust medications, avoid nephrotoxic agents/dehydration/hypotension, patient with urinary retention, Foley placed and 850 mL of urine retrieved, renal ultrasound requested, nephrology consult requested..... Given hypoglycemia, given metabolic acidosis and worsening renal function change IV fluids to D5 water with bicarb at 125 mL  an hour  2) acute metabolic encephalopathy--- suspect this may be related to worsening renal function, need to rule out   Infection,  patient with increasing confusion and agitation, ammonia only slightly elevated, leukocytosis persist (WBC down to 14.4, was 18.8 on admission) without fevers, UA not impressive from a UTI standpoint, CT head without new acute findings prior strokes noted  3)Anemia--- serum iron is 26 with iron saturation of 9, and ferritin is elevated at 455 baseline acute phase reactant  4) alcoholic liver cirrhosis--AST to ALT ratio is > 2:1, serum ammonia was elevated, patient is more lethargic, tried rectal lactulose on 02/17/2018 with limited results, will place NG tube and give lactulose through NG tube  5)H/o Recent CVA and carotid artery stenosis/PAD--- continue aspirin and Plavix, statin on hold due to elevated LFTs, patient was previously scheduled for right carotid endarterectomy in about a week or so  6)Social/Ethics-- on 02/17/18... Phone Conference with patient's daughter Ms. Lemmie Evens--- RN Evalyn Casco participated via Speaker Phone).. .... Daughter request DNR/DNI status No heroic measures,..... As per Daughter---Okay to place NG tube for lactulose administration, okay to use wrist restraints to prevent patient from pulling tubes out if she does pull out her tubes  Disposition/Need for in-Hospital Stay- patient unable to be discharged at this time due to worsening renal function---Given hypoglycemia, given metabolic acidosis and worsening renal function change IV  fluids to D5 water with bicarb at 125 mL an hour   Code Status : Full  Family Communication:   na   Disposition Plan  : SNF Rehab  Consults  :  Nephrology  DVT Prophylaxis  :    - Heparin -    Lab Results  Component Value Date   PLT 213 02/17/2018    Inpatient Medications  Scheduled Meds: . aspirin  325 mg Oral Daily  . clopidogrel  75 mg Oral Q breakfast  . heparin  5,000 Units  Subcutaneous Q8H  . lactulose  30 g Per Tube TID  . QUEtiapine  200 mg Oral QHS  . QUEtiapine  400 mg Oral QHS   Continuous Infusions: .  sodium bicarbonate  infusion 1000 mL     PRN Meds:.acetaminophen **OR** acetaminophen, polyethylene glycol    Anti-infectives (From admission, onward)   None        Objective:   Vitals:   02/16/18 1942 02/17/18 0128 02/17/18 0502 02/17/18 1417  BP: (!) 150/96  124/66 114/67  Pulse: 97  87 91  Resp: 19   18  Temp: 100 F (37.8 C)   97.9 F (36.6 C)  TempSrc: Oral     SpO2: 99%  95% 99%  Weight:  67.8 kg    Height:        Wt Readings from Last 3 Encounters:  02/17/18 67.8 kg  02/03/18 59 kg  01/06/18 64.6 kg     Intake/Output Summary (Last 24 hours) at 02/17/2018 1826 Last data filed at 02/17/2018 1819 Gross per 24 hour  Intake -  Output 850 ml  Net -850 ml     Physical Exam Patient is examined daily including today on 02/17/18 , exams remain the same as of yesterday except that has changed   Gen:- lethargic, disoriented and confused HEENT:- [place].AT, No sclera icterus Nose- NG tube in situ  Neck-Supple Neck,No JVD,.  Lungs-  CTAB , fair symmetrical air movement CV- S1, S2 normal, regular  Abd-  +ve B.Sounds, Abd Soft, No tenderness,    Extremity/Skin:- No  edema, pedal pulses present  NeuroPsych-generalized weakness, confused and disoriented, lethargic GU--- foley placed  Data Review:   Micro Results Recent Results (from the past 240 hour(s))  Urine culture     Status: None   Collection Time: 02/15/18 10:00 PM  Result Value Ref Range Status   Specimen Description   Final    URINE, CATHETERIZED Performed at [facility], [address], [place], [place] [address]    Special Requests   Final    NONE Performed at [facility], [address] [place], [place] [address]    Culture   Final    NO GROWTH Performed at [facility], [place] [address], [place], [place] [address]    Report Status 02/17/2018 FINAL   Final  Culture, blood (Routine X 2) w Reflex to ID Panel     Status: None (Preliminary result)   Collection Time: 02/16/18  6:21 PM  Result Value Ref Range Status   Specimen Description RIGHT ANTECUBITAL  Final   Special Requests   Final    BOTTLES DRAWN AEROBIC AND ANAEROBIC Blood Culture adequate volume   Culture   Final    NO GROWTH < 24 HOURS Performed at [facility], [address], [place], [place] [address]    Report Status PENDING  Incomplete    Radiology Reports Ct Head Wo Contrast  Result Date: 02/16/2018 CLINICAL DATA:  Encephalopathy.  Increased confusion. EXAM: CT HEAD WITHOUT CONTRAST TECHNIQUE: Contiguous axial images were obtained from the base of the skull through the vertex without intravenous contrast. COMPARISON:  Head CT 01/01/2018, brain MRI 12/23/2017 FINDINGS: Despite restraints and technologist directions and repeat apposition, there is moderate motion artifact, patient had difficulty tolerating the exam. Brain: Unchanged atrophy and chronic small vessel ischemia. No evidence of hydrocephalus. No acute intracranial hemorrhage allowing for motion. Left cerebellar infarct evaluation is limited by motion, no evidence of significant progression. Periventricular right frontal lobe infarcts are not as well seen currently. No gross evidence of acute ischemia, detailed evaluation limited. No midline shift or mass effect. Vascular: No obvious hyperdense vessel. Skull: No acute findings. Sinuses/Orbits: Grossly negative. Other: None. IMPRESSION: 1. Significant motion degradation. 2. No gross acute intracranial abnormality. Left cerebellar and right frontal infarcts on prior exam are partially obscured by motion, no evidence of significant progression. Generalized atrophy and chronic small vessel ischemia again seen. Electronically Signed   By: Keith Rake M.D.   On: 02/16/2018 03:31   US Abdomen Complete  Result Date: 02/16/2018 CLINICAL DATA:  Acute on chronic renal failure.  Elevated liver function tests. EXAM: ABDOMEN ULTRASOUND COMPLETE COMPARISON:  CT abdomen 11/18/2016 FINDINGS: The patient was combative and the patient actively resisted ultrasound. As result, a variety of areas normal included on abdominal ultrasound could not be obtained. Gallbladder: The gallbladder was not identified sonographically. It is possible that an existing gallbladder could be obscured by the heavy calcifications along the gallbladder wall shown on the prior CT from 2018. Common bile duct: Diameter: Could not be obtained. Liver: Nodular liver contour compatible with cirrhosis. No discrete focal mass. Portal vein is patent on color Doppler imaging with normal direction of blood flow towards the liver. IVC: Could not be obtained. Pancreas: Could not be obtained. Spleen: Could not be obtained. Right Kidney: Could not be obtained. Left Kidney: Length: Could not be obtained. Abdominal aorta: Could not be obtained. Other findings: None. IMPRESSION: 1. Nodular hepatic contour compatible with cirrhosis. No discrete focal mass is identified. Expected hepatopetal portal venous flow. 2. Gallbladder not well seen. It is possible that existing gallbladder could be obscured by the heavy calcifications shown on the prior CT from 2018. 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 3. The patient was combative and actively resisted sonography. As result, the following areas could not be observed: Common bile duct, IVC, pancreas, spleen, kidneys, and abdominal aorta. Electronically Signed   By: Van Clines M.D.   On: 02/16/2018 12:43   Dg Chest Port 1 View  Result Date: 02/17/2018 CLINICAL DATA:  68 y/o  F; NG tube. EXAM: PORTABLE CHEST 1 VIEW COMPARISON:  02/15/2018 chest radiograph FINDINGS: Normal cardiac silhouette. Enteric tube is coiling in the lower esophagus and tip appears to be projecting over the right mainstem bronchus in the right lower lung. Clear  lungs. No pleural effusion or pneumothorax. No acute osseous abnormality is evident. IMPRESSION: Enteric tube tip projects over the right lower lung in the airways, repositioning recommended. These results will be called to the ordering clinician or representative by the Radiologist Assistant, and communication documented in the PACS or zVision Dashboard. Electronically Signed   By: Kristine Garbe M.D.   On: 02/17/2018 17:35   Dg Chest Port 1 View  Result Date: 02/15/2018 CLINICAL DATA:  Disorientation. EXAM: PORTABLE CHEST 1 VIEW COMPARISON:  12/28/2017 FINDINGS: Calcific atherosclerotic disease of the aorta. There is no evidence of focal  airspace consolidation, pleural effusion or pneumothorax. Low lung volumes. Osseous structures are without acute abnormality. Soft tissues are grossly normal. IMPRESSION: No active disease. Electronically Signed   By: Fidela Salisbury M.D.   On: 02/15/2018 23:17     CBC Recent Labs  Lab 02/15/18 2115 02/16/18 0516 02/17/18 1631  WBC 18.8* 17.4* 14.4*  HGB 9.0* 8.8* 8.5*  HCT 28.7* 28.5* 27.1*  PLT 225 222 213  MCV 88.6 89.1 89.1  MCH 27.8 27.5 28.0  MCHC 31.4 30.9 31.4  RDW 15.1 15.0 14.9  LYMPHSABS 2.3 3.8  --   MONOABS 1.8* 1.9*  --   EOSABS 0.0 0.1  --   BASOSABS 0.0 0.1  --     Chemistries  Recent Labs  Lab 02/15/18 2115 02/16/18 0516 02/17/18 1631  NA 135 134* 140  K 4.7 4.1 4.3  CL 101 100 109  CO2 24 25 16*  GLUCOSE 124* 92 54*  BUN 54* 53* 64*  CREATININE 3.47* 3.83* 4.24*  CALCIUM 8.3* 8.1* 7.9*  MG  --  2.4  --   AST 184* 135* 87*  ALT 74* 62* 45*  ALKPHOS 63 54 58  BILITOT 1.0 0.6 1.3*   ------------------------------------------------------------------------------------------------------------------ No results for input(s): CHOL, HDL, LDLCALC, TRIG, CHOLHDL, LDLDIRECT in the last 72 hours.  Lab Results  Component Value Date   HGBA1C 6.7 (H) 12/23/2017    ------------------------------------------------------------------------------------------------------------------ Recent Labs    02/16/18 0516  TSH 0.734   ------------------------------------------------------------------------------------------------------------------ Recent Labs    02/16/18 0516  VITAMINB12 217  FERRITIN 455*  TIBC 298  IRON 26*  RETICCTPCT 0.7    Coagulation profile No results for input(s): INR, PROTIME in the last 168 hours.  No results for input(s): DDIMER in the last 72 hours.  Cardiac Enzymes No results for input(s): CKMB, TROPONINI, MYOGLOBIN in the last 168 hours.  Invalid input(s): CK ------------------------------------------------------------------------------------------------------------------ No results found for: BNP   Roxan Hockey M.D on 02/17/2018 at 6:26 PM  Go to [email or web address] - for contact info  [facility] - Office  [phone]

## 2018-02-17 NOTE — NC FL2 (Deleted)
[place] MEDICAID FL2 LEVEL OF CARE SCREENING TOOL     IDENTIFICATION  Patient Name: Cassandra Jordan Birthdate: 04-29-1950 Sex: female Admission Date (Current Location): 02/15/2018  [facility] and [place] Number:      Facility and Address:  [facility] [address] [place]      Provider Number: [id number]  Attending Physician Name and Address:  Roxan Hockey, MD  Relative Name and Phone Number:       Current Level of Care: Hospital Recommended Level of Care: [place], Memory Care Prior Approval Number:    Date Approved/Denied:   PASRR Number:    Discharge Plan: SNF    Current Diagnoses: Patient Active Problem List   Diagnosis Date Noted  . Hepatic encephalopathy ([place]) 02/17/2018  . Chronic combined systolic and diastolic CHF (congestive heart failure) ([place]) 02/15/2018  . History of CVA in adulthood 02/15/2018  . Acute encephalopathy   . Advanced care planning/counseling discussion   . Palliative care by specialist   . Cerebellar stroke ([place])   . Prolonged QT interval 12/23/2017  . Hyperphosphatemia 05/22/2017  . Acute CVA (cerebrovascular accident) ([place]) 05/21/2017  . Abdominal pain 11/18/2016  . Cholelithiasis 11/18/2016  . Uncontrolled type 2 diabetes mellitus with hyperglycemia, with long-term current use of insulin ([place]) 11/18/2016  . CKD (chronic kidney disease), stage III ([place]) 11/18/2016  . Acute pancreatitis   . Ileus ([place])   . Porcelain gallbladder   . Acute renal failure superimposed on stage 3 chronic kidney disease ([place]) 11/04/2016  . Hypokalemia 11/01/2016  . Hypomagnesemia 11/01/2016  . SBO (small bowel obstruction) ([place])   . AKI (acute kidney injury) ([place]) 10/30/2016  . Leukocytosis 10/30/2016  . GERD (gastroesophageal reflux disease) 10/29/2016  . Small bowel obstruction ([place]) 10/29/2016  . History of drug use 07/17/2016  . Constipation 05/30/2016  . Fecal incontinence 01/04/2016  . Hepatic  cirrhosis ([place]) 04/08/2015  . History of adenomatous polyp of colon   . History of colonic polyps   . Hx of adenomatous colonic polyps 12/10/2014  . Lichen sclerosus et atrophicus 10/13/2014  . Vaginal itching 10/01/2014  . Vaginal irritation 10/01/2014  . Yeast infection of the vagina 10/01/2014  . Blurred vision, right eye 03/20/2014  . Cocaine abuse ([place]) 03/20/2014  . Generalized weakness 03/20/2014  . Chest pain 02/10/2014  . Chest pain at rest 02/10/2014  . Type 2 diabetes mellitus ([place])   . Diabetes mellitus, type II ([place])   . Tobacco abuse   . PSVT (paroxysmal supraventricular tachycardia) ([place]) 11/04/2013  . Essential hypertension 10/16/2013  . Renal mass 10/14/2013  . Ovarian mass, left 08/17/2013  . Hepatitis C, chronic ([place]) 07/01/2013  . Elevated LFTs 07/01/2013  . Other dysphagia 07/01/2013  . Goals of care, counseling/discussion 07/01/2013    Orientation RESPIRATION BLADDER Height & Weight        Normal Incontinent Weight: 149 lb 7.6 oz (67.8 kg) Height:  5' 1"  (154.9 cm)  BEHAVIORAL SYMPTOMS/MOOD NEUROLOGICAL BOWEL NUTRITION STATUS      Incontinent Diet(heart healthy)  AMBULATORY STATUS COMMUNICATION OF NEEDS Skin   Extensive Assist Verbally Normal                       Personal Care Assistance Level of Assistance  Bathing, Feeding, Dressing Bathing Assistance: Maximum assistance Feeding assistance: Limited assistance Dressing Assistance: Maximum assistance     Functional Limitations Info  Sight, Hearing, Speech Sight Info: Adequate Hearing Info: Adequate Speech Info: Adequate  SPECIAL CARE FACTORS FREQUENCY                       Contractures Contractures Info: Not present    Additional Factors Info  Code Status, Allergies, Psychotropic Code Status Info: DNR Allergies Info: Sulfa Antibiotics Psychotropic Info: Haldol, Seroquel         Current Medications (02/17/2018):  This is the current hospital active medication  list Current Facility-Administered Medications  Medication Dose Route Frequency Provider Last Rate Last Dose  . 0.9 %  sodium chloride infusion   Intravenous Continuous Emokpae, Courage, MD 75 mL/hr at 02/17/18 1051    . acetaminophen (TYLENOL) tablet 650 mg  650 mg Oral Q6H PRN Opyd, Ilene Qua, MD   650 mg at 02/16/18 2007   Or  . acetaminophen (TYLENOL) suppository 650 mg  650 mg Rectal Q6H PRN Opyd, Ilene Qua, MD      . aspirin tablet 325 mg  325 mg Oral Daily Opyd, Ilene Qua, MD   325 mg at 02/16/18 1047  . clopidogrel (PLAVIX) tablet 75 mg  75 mg Oral Q breakfast Opyd, Ilene Qua, MD   75 mg at 02/16/18 1047  . haloperidol (HALDOL) tablet 2 mg  2 mg Oral TID Vianne Bulls, MD   2 mg at 02/16/18 2007  . heparin injection 5,000 Units  5,000 Units Subcutaneous Q8H Opyd, Ilene Qua, MD   5,000 Units at 02/17/18 1324  . lactulose (CHRONULAC) 10 GM/15ML solution 30 g  30 g Per Tube TID Emokpae, Courage, MD      . polyethylene glycol (MIRALAX / GLYCOLAX) packet 17 g  17 g Oral Daily PRN Opyd, Ilene Qua, MD      . QUEtiapine (SEROQUEL) tablet 200 mg  200 mg Oral QHS Opyd, Ilene Qua, MD   200 mg at 02/16/18 2008  . QUEtiapine (SEROQUEL) tablet 400 mg  400 mg Oral QHS Opyd, Ilene Qua, MD   400 mg at 02/16/18 2006     Discharge Medications: Please see discharge summary for a list of discharge medications.  Relevant Imaging Results:  Relevant Lab Results:   Additional Information SSN: [id number]  Airika Alkhatib, Clydene Pugh, LCSW

## 2018-02-17 NOTE — Telephone Encounter (Signed)
Call from patient's daughter TINA. Patient is inpatient at [facility] and cancelling surgery for 02/21/2018 with Dr. Donnetta Hutching. Instructed to call this office when ready to reschedule.

## 2018-02-17 NOTE — Care Management Obs Status (Signed)
[place] NOTIFICATION   Patient Details  Name: Cassandra Jordan MRN: [id number] Date of Birth: 29-Jun-1950   Medicare Observation Status Notification Given:  Other (see comment)    Shelda Altes 02/17/2018, 10:48 AM

## 2018-02-17 NOTE — Clinical Social Work Note (Signed)
Clinical Social Work Assessment  Patient Details  Name: Cassandra Jordan MRN: [id number] Date of Birth: Dec 05, 1950  Date of referral:  02/17/18               Reason for consult:  Other (Comment Required)(From [facility] )                Permission sought to share information with:    Permission granted to share information::     Name::        Agency::     Relationship::     Contact Information:  [facility] at [facility]  Housing/Transportation Living arrangements for the past 2 months:  [place] of Information:  Facility Patient Interpreter Needed:  None Criminal Activity/Legal Involvement Pertinent to Current Situation/Hospitalization:  No - Comment as needed Significant Relationships:  Adult Children Lives with:  Facility Resident Do you feel safe going back to the place where you live?  Yes Need for family participation in patient care:  Yes (Comment)  Care giving concerns:  Facility resident.    Social Worker assessment / plan:  Patient is a LTC resident at [facility] in the memory care unit. She can return at discharge.   Employment status:  Disabled (Comment on whether or not currently receiving Disability) Insurance information:    PT Recommendations:  Not assessed at this time Information / Referral to community resources:     Patient/Family's Response to care:  Patient is a long term care resident at the facility as family is unable to provide care for her.   Patient/Family's Understanding of and Emotional Response to Diagnosis, Current Treatment, and Prognosis:  Family has been made aware of patient's hospitalization.   Emotional Assessment Appearance:  Appears stated age Attitude/Demeanor/Rapport:    Affect (typically observed):  Unable to Assess Orientation:    Alcohol / Substance use:  Not Applicable Psych involvement (Current and /or in the community):  No (Comment)  Discharge Needs  Concerns to be addressed:   Other (Comment Required(return to facility. ) Readmission within the last 30 days:  No Current discharge risk:  None Barriers to Discharge:  No Barriers Identified   Ihor Gully, LCSW 02/17/2018, 4:01 PM

## 2018-02-18 ENCOUNTER — Inpatient Hospital Stay (HOSPITAL_COMMUNITY): Payer: Medicare Other

## 2018-02-18 LAB — COMPREHENSIVE METABOLIC PANEL
ALBUMIN: 2.5 g/dL — AB (ref 3.5–5.0)
ALT: 42 U/L (ref 0–44)
AST: 82 U/L — AB (ref 15–41)
Alkaline Phosphatase: 64 U/L (ref 38–126)
Anion gap: 10 (ref 5–15)
BILIRUBIN TOTAL: 0.8 mg/dL (ref 0.3–1.2)
BUN: 61 mg/dL — AB (ref 8–23)
CO2: 23 mmol/L (ref 22–32)
Calcium: 7.9 mg/dL — ABNORMAL LOW (ref 8.9–10.3)
Chloride: 107 mmol/L (ref 98–111)
Creatinine, Ser: 3.99 mg/dL — ABNORMAL HIGH (ref 0.44–1.00)
GFR calc Af Amer: 13 mL/min — ABNORMAL LOW (ref >60)
GFR, EST NON AFRICAN AMERICAN: 11 mL/min — AB (ref >60)
Glucose, Bld: 101 mg/dL — ABNORMAL HIGH (ref 70–99)
Potassium: 3.5 mmol/L (ref 3.5–5.1)
Sodium: 140 mmol/L (ref 135–145)
Total Protein: 6.5 g/dL (ref 6.5–8.1)

## 2018-02-18 LAB — CBC
HEMATOCRIT: 26 % — AB (ref 36.0–46.0)
HEMOGLOBIN: 8.2 g/dL — AB (ref 12.0–15.0)
MCH: 27.5 pg (ref 26.0–34.0)
MCHC: 31.5 g/dL (ref 30.0–36.0)
MCV: 87.2 fL (ref 80.0–100.0)
Platelets: 233 10*3/uL (ref 150–400)
RBC: 2.98 MIL/uL — ABNORMAL LOW (ref 3.87–5.11)
RDW: 14.9 % (ref 11.5–15.5)
WBC: 11.8 10*3/uL — ABNORMAL HIGH (ref 4.0–10.5)
nRBC: 0 % (ref 0.0–0.2)

## 2018-02-18 LAB — FOLATE RBC
Folate, Hemolysate: 341 ng/mL
Folate, RBC: 1231 ng/mL (ref >498)
Hematocrit: 27.7 % — ABNORMAL LOW (ref 34.0–46.6)

## 2018-02-18 LAB — AMMONIA: AMMONIA: 33 umol/L (ref 9–35)

## 2018-02-18 LAB — HIV ANTIBODY (ROUTINE TESTING W REFLEX): HIV Screen 4th Generation wRfx: NONREACTIVE

## 2018-02-18 MED ORDER — ENSURE ENLIVE PO LIQD
237.0000 mL | Freq: Four times a day (QID) | ORAL | Status: DC
  Administered 2018-02-18 – 2018-02-21 (×8): 237 mL

## 2018-02-18 MED ORDER — POTASSIUM CHLORIDE 20 MEQ PO PACK
40.0000 meq | PACK | Freq: Once | ORAL | Status: AC
  Administered 2018-02-18: 40 meq via ORAL
  Filled 2018-02-18: qty 2

## 2018-02-18 MED ORDER — SODIUM BICARBONATE 8.4 % IV SOLN
INTRAVENOUS | Status: AC
  Filled 2018-02-18: qty 100

## 2018-02-18 MED ORDER — SODIUM BICARBONATE 8.4 % IV SOLN
INTRAVENOUS | Status: DC
  Filled 2018-02-18 (×3): qty 50

## 2018-02-18 MED ORDER — LORAZEPAM 2 MG/ML IJ SOLN
0.5000 mg | Freq: Four times a day (QID) | INTRAMUSCULAR | Status: DC | PRN
  Administered 2018-02-20: 0.5 mg via INTRAVENOUS
  Filled 2018-02-18: qty 1

## 2018-02-18 MED ORDER — LACTATED RINGERS IV SOLN
INTRAVENOUS | Status: DC
  Administered 2018-02-18 (×2): via INTRAVENOUS

## 2018-02-18 MED ORDER — ENSURE ENLIVE PO LIQD: 237.0000 mL | Freq: Four times a day (QID) | ORAL | Status: DC

## 2018-02-18 NOTE — Consult Note (Signed)
Reason for Consult: Acute kidney injury, anion gap metabolic acidosis Referring Physician: Roxan Hockey MD [facility])  HPI: (Patient with altered mental status, HPI constructed from previous records) 68 year old African-American woman with past medical history significant for hypertension, hepatitis C virus infection with cirrhosis, prior history of cocaine use, coronary artery stenosis, recent acute CVA and baseline chronic kidney disease stage III (creatinine 1.3-1.5) who was admitted to the hospital 2 days ago for increasing confusion and agitation and waxing and waning mental status.  On admission, she was noted to be suffering acute on chronic renal failure with creatinine rising to as high as 4.2 yesterday from 3.5 on admission and with fluids it has either stabilized or slightly improved to 3.99 this morning.  She is nonoliguric.  Reported history of having a solitary kidney.  The patient herself is not able to voice any additional details towards her history due to her current mental status and reports are noted of poor oral intake due to encephalopathy.  Not on diuretics/rest blockers prior to admission.  No recent iodinated intravenous contrast exposure.  She was on hypotonic sodium bicarbonate overnight.  Past Medical History:  Diagnosis Date  . Alcohol abuse   . Arthritis   . Asthma   . Cocaine abuse ([place])   . Essential hypertension   . GERD (gastroesophageal reflux disease)   . Hepatitis C   . Hyperlipidemia   . Lichen sclerosus et atrophicus 10/13/2014  . Neuropathic pain   . Noncompliance   . PSVT (paroxysmal supraventricular tachycardia) ([place])   . Rash   . Renal carcinoma ([place])   . Sleep apnea   . Type 2 diabetes mellitus ([place])   . Vaginal irritation 10/01/2014  . Vaginal itching 10/01/2014  . Yeast infection of the vagina 10/01/2014    Past Surgical History:  Procedure Laterality Date  . ABDOMINAL HYSTERECTOMY    . COLONOSCOPY N/A 07/08/2013   RMR: Multiple colonic polyps  treated [name] as described above. Inadequate prepartation comprimised examination there colonic diverticulosis. tubular and tubulovillous adenomas. next TCS 12/2013  . COLONOSCOPY N/A 02/09/2015   RMR: Colonic polyp removed as described above. Tubular adenoma. Next colonoscopy January 2022.  . ESOPHAGOGASTRODUODENOSCOPY N/A 07/08/2013   RMR: Schatzki's ring ; small hiatal hernia-status post passage of a Maloney dilator  . MALONEY DILATION N/A 07/08/2013   Procedure: [name] DILATION;  Surgeon: [name], MD;  Location: [facility];  Service: Endoscopy;  Laterality: N/A;  . ROBOT ASSISTED LAPAROSCOPIC NEPHRECTOMY Right 10/14/2013   Procedure: ROBOTIC ASSISTED LAPAROSCOPIC RADICAL NEPHRECTOMY;  Surgeon: [name], MD;  Location: [facility];  Service: Urology;  Laterality: Right;  . SAVORY DILATION N/A 07/08/2013   Procedure: SAVORY DILATION;  Surgeon: [name], MD;  Location: [facility];  Service: Endoscopy;  Laterality: N/A;  . TONSILLECTOMY    . Wart removal      Family History  Problem Relation Age of Onset  . Colon cancer Sister 70  . Cancer Mother   . Heart attack Father   . Hypertension Daughter   . Cancer Maternal Grandfather   . Diabetes Paternal Grandmother     Social History:  reports that she has been smoking cigarettes. She has a 4.00 pack-year smoking history. She has never used smokeless tobacco. She reports previous alcohol use. She reports current drug use. Drugs: Marijuana and Cocaine.  Allergies:  Allergies  Allergen Reactions  . Sulfa Antibiotics Itching and Other (See Comments)    burning    Medications:  Scheduled: .  aspirin  325 mg Oral Daily  . clopidogrel  75 mg Oral Q breakfast  . heparin  5,000 Units Subcutaneous Q8H  . lactulose  30 g Per Tube TID  . potassium chloride  40 mEq Oral Once  . QUEtiapine  200 mg Oral QHS    BMP Latest Ref Rng & Units 02/18/2018 02/17/2018 02/16/2018  Glucose 70 - 99 mg/dL 101(H) 54(L) 92  BUN 8 - 23 mg/dL  61(H) 64(H) 53(H)  Creatinine 0.44 - 1.00 mg/dL 3.99(H) 4.24(H) 3.83(H)  Sodium 135 - 145 mmol/L 140 140 134(L)  Potassium 3.5 - 5.1 mmol/L 3.5 4.3 4.1  Chloride 98 - 111 mmol/L 107 109 100  CO2 22 - 32 mmol/L 23 16(L) 25  Calcium 8.9 - 10.3 mg/dL 7.9(L) 7.9(L) 8.1(L)   CBC Latest Ref Rng & Units 02/18/2018 02/17/2018 02/16/2018  WBC 4.0 - 10.5 K/uL 11.8(H) 14.4(H) 17.4(H)  Hemoglobin 12.0 - 15.0 g/dL 8.2(L) 8.5(L) 8.8(L)  Hematocrit 36.0 - 46.0 % 26.0(L) 27.1(L) 28.5(L)  Platelets 150 - 400 K/uL 233 213 222     US Abdomen Complete  Result Date: 02/16/2018 CLINICAL DATA:  Acute on chronic renal failure. Elevated liver function tests. EXAM: ABDOMEN ULTRASOUND COMPLETE COMPARISON:  CT abdomen 11/18/2016 FINDINGS: The patient was combative and the patient actively resisted ultrasound. As result, a variety of areas normal included on abdominal ultrasound could not be obtained. Gallbladder: The gallbladder was not identified sonographically. It is possible that an existing gallbladder could be obscured by the heavy calcifications along the gallbladder wall shown on the prior CT from 2018. Common bile duct: Diameter: Could not be obtained. Liver: Nodular liver contour compatible with cirrhosis. No discrete focal mass. Portal vein is patent on color Doppler imaging with normal direction of blood flow towards the liver. IVC: Could not be obtained. Pancreas: Could not be obtained. Spleen: Could not be obtained. Right Kidney: Could not be obtained. Left Kidney: Length: Could not be obtained. Abdominal aorta: Could not be obtained. Other findings: None. IMPRESSION: 1. Nodular hepatic contour compatible with cirrhosis. No discrete focal mass is identified. Expected hepatopetal portal venous flow. 2. Gallbladder not well seen. It is possible that existing gallbladder could be obscured by the heavy calcifications shown on the prior CT from 2018. 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 3. The patient was combative and actively resisted sonography. As result, the following areas could not be observed: Common bile duct, IVC, pancreas, spleen, kidneys, and abdominal aorta. Electronically Signed   By: Van Clines M.D.   On: 02/16/2018 12:43   Dg Chest Port 1 View  Result Date: 02/17/2018 CLINICAL DATA:  68 y/o  F; NG tube. EXAM: PORTABLE CHEST 1 VIEW COMPARISON:  02/15/2018 chest radiograph FINDINGS: Normal cardiac silhouette. Enteric tube is coiling in the lower esophagus and tip appears to be projecting over the right mainstem bronchus in the right lower lung. Clear lungs. No pleural effusion or pneumothorax. No acute osseous abnormality is evident. IMPRESSION: Enteric tube tip projects over the right lower lung in the airways, repositioning recommended. These results will be called to the ordering clinician or representative by the Radiologist Assistant, and communication documented in the PACS or zVision Dashboard. Electronically Signed   By: Kristine Garbe M.D.   On: 02/17/2018 17:35   Dg Chest Port 1v Same Day  Result Date: 02/17/2018 CLINICAL DATA:  Nasogastric tube placement. EXAM: PORTABLE CHEST 1 VIEW COMPARISON:  Chest x-ray from same day at 1653.  FINDINGS: Nasogastric tube seen coiled within the stomach, with the tip below the field of view. Stable cardiomediastinal silhouette. Normal pulmonary vascularity. No focal consolidation, pleural effusion, or pneumothorax. No acute osseous abnormality. IMPRESSION: Enteric tube coiled within the stomach with the tip below the field of view. Electronically Signed   By: Titus Dubin M.D.   On: 02/17/2018 18:35    Review of Systems  Unable to perform ROS: Mental status change   Blood pressure 130/72, pulse 92, temperature (!) 97.5 F (36.4 C), temperature source Axillary, resp. rate 16, height 5' 1"  (1.549 m), weight 64.6 kg, SpO2 98 %. Physical Exam  Nursing note and vitals  reviewed. Constitutional: She appears well-developed and well-nourished.  Appears uncomfortable, intermittently talking out of context  HENT:  Head: Normocephalic and atraumatic.  Mouth/Throat: No oropharyngeal exudate.  Dry oral mucosa/lips  Eyes: Pupils are equal, round, and reactive to light. Conjunctivae are normal. No scleral icterus.  NG tube in situ  Neck: Normal range of motion. Neck supple. No JVD present.  Cardiovascular: Normal rate, regular rhythm and normal heart sounds. Exam reveals no friction rub.  No murmur heard. Respiratory: Effort normal and breath sounds normal. She has no wheezes. She has no rales.  GI: Soft. Bowel sounds are normal. There is no abdominal tenderness. There is no rebound.  Musculoskeletal:        General: No edema.  Neurological:  Appears confused/disoriented  Skin: Skin is warm and dry. No erythema.    Assessment/Plan: 1.  Acute encephalopathy: This is most consistent with hepatic encephalopathy given her underlying cirrhosis and elevated ammonia level/pattern of presentation.  It is indeed possible that her azotemia might have been contributory to mental status changes seen on admission.  Will continue supportive management and reevaluation. 2.  Acute kidney injury on chronic kidney disease stage III: In patient with solitary kidney and baseline chronic kidney disease status possibly multifactorial given prior history of cocaine use and underlying hepatitis C/hypertension.  Etiology suspected to be prerenal with decreased oral intake and her status of encephalopathy.  She has had decent urine output overnight and I will continue supplementing with intravenous fluids-switch to lactated Ringer's at this time.  She does not have indications for hemodialysis and agree with potassium supplementation which is likely as a result of her improved urine output as well as shifting from alkalinization.  Renal ultrasound pending.  Urinalysis/urine electrolytes  reviewed. 3.  History of CVA: With prior history of carotid stenosis and plans noted for surgical intervention at some point down the road. 4.  History of systolic/diastolic congestive heart failure: At this time appears to be compensated and we will exercise caution with regards to volume resuscitation. 5.  Leukocytosis: Unclear etiology/source-suspected demargination.  We will continue expectant monitoring  Jamerica Snavely K. 02/18/2018, 9:51 AM

## 2018-02-18 NOTE — Progress Notes (Signed)
Patient Demographics:    Cassandra Jordan, is a 68 y.o. female, DOB - 1950-10-05, TML:[id number]  Admit date - 02/15/2018   Admitting Physician Vianne Bulls, MD  Outpatient Primary MD for the patient is Abran Richard, MD  LOS - 1   Chief Complaint  Patient presents with  . Altered Mental Status        Subjective:    Cassandra Jordan today has no fevers, no emesis,  No chest pain, more awake, talking, oral intake remains very very poor  Assessment  & Plan :    Principal Problem:   Acute renal failure superimposed on stage 3 chronic kidney disease (HCC) Active Problems:   Elevated LFTs   Essential hypertension   Leukocytosis   Prolonged QT interval   Chronic combined systolic and diastolic CHF (congestive heart failure) (HCC)   History of CVA in adulthood   Hepatic encephalopathy [facility])  Brief Summary 68 y.o. female with medical history significant for hepatitis C with cirrhosis, cocaine abuse, hypertension, chronic kidney disease stage III, carotid artery stenosis, and recent admission for acute CVA readmitted on 02/15/2018 with hepatic encephalopathy and acute kidney injury, please note the patient is currently scheduled for right carotid endarterectomy  last week of January 2020   Plan:- 1)AKI----acute kidney injury on CKD stage - III    creatinine on admission= 3.47  ,   baseline creatinine = 1.48 (12/2017)   , creatinine is now=  3.99 (4.24 peak) ,  bicarb is up to 23 from 16     , please note that patient only has 1 solitary Left kidney , renally adjust medications, avoid nephrotoxic agents/dehydration/hypotension, patient with urinary retention, continue Foley catheter (placed 02/17/18), urine output appears to be good, renal ultrasound without obstructive uropathy,  nephrology consult appreciated... 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 Phone Conference with patient's daughter Ms. Lemmie Evens--- RN Evalyn Casco participated via Speaker Phone).. .... Daughter request DNR/DNI status No heroic measures,..... As per Daughter---Okay to place NG tube for lactulose administration, okay to use wrist restraints to prevent patient from pulling tubes out if she does pull out her tubes  7)HFrEF--- last known EF 45 to 50%, patient with history of combined diastolic and systolic dysfunction CHF, on admission she appeared dehydrated due to encephalopathy with poor oral intake, be judicious with IV fluids to avoid volume overload  8)FEN---  Given Ensure 1 can 3 times daily  Disposition/Need for in-Hospital Stay- patient unable to be discharged at this time due to worsening renal function---  continue IV fluids, continue to feed her through NG tube until patient is awake enough to take oral intake  Code Status : Full  Family Communication:  Daughter Cassandra Jordan)   Disposition Plan  : SNF Rehab  Consults  :  Nephrology  DVT Prophylaxis  :    - Heparin -    Lab Results  Component Value Date   PLT 233  02/18/2018    Inpatient Medications  Scheduled Meds: . aspirin  325 mg Oral Daily  . clopidogrel  75 mg Oral Q breakfast  . heparin  5,000 Units Subcutaneous Q8H  . lactulose  30 g Per Tube TID  . QUEtiapine  200 mg Oral QHS   Continuous Infusions: . lactated ringers 100 mL/hr at 02/18/18 1134   PRN Meds:.acetaminophen **OR** acetaminophen, LORazepam, polyethylene glycol    Anti-infectives (From admission, onward)   None        Objective:   Vitals:   02/17/18 2033 02/17/18 2154 02/18/18 0500 02/18/18 0700  BP:  125/73  130/72  Pulse:  94  92  Resp:  18  16  Temp:  (!) 97.5 F (36.4 C)  (!) 97.5 F (36.4 C)  TempSrc:  Oral  Axillary  SpO2: 97% 99%  98%  Weight:   64.6 kg   Height:        Wt Readings from Last 3 Encounters:  02/18/18 64.6 kg  02/03/18 59 kg  01/06/18 64.6 kg     Intake/Output Summary (Last 24 hours) at 02/18/2018 1335 Last data filed at 02/18/2018 0700 Gross per 24 hour  Intake 1567.51 ml  Output 1250 ml  Net 317.51 ml     Physical Exam Patient is examined daily including today on 02/18/18 , exams remains the same as of yesterday except that has changed   Gen:- disoriented and confused--patient is waking up more HEENT:- [place].AT, No sclera icterus Nose- NG tube in situ  Neck-Supple Neck,No JVD,.  Lungs-  CTAB , fair symmetrical air movement CV- S1, S2 normal, regular  Abd-  +ve B.Sounds, Abd Soft, No tenderness,    Extremity/Skin:- No  edema, pedal pulses present  NeuroPsych-generalized weakness, confused and disoriented,  GU--- foley with clear urine  Data Review:   Micro Results Recent Results (from the past 240 hour(s))  Urine culture     Status: None   Collection Time: 02/15/18 10:00 PM  Result Value Ref Range Status   Specimen Description   Final    URINE, CATHETERIZED Performed at [facility], [address], [place], [place] [address]    Special Requests   Final    NONE Performed at [facility], [address]  [place], [place] [address]    Culture   Final    NO GROWTH Performed at [facility], [place] [address], [place], [place] [address]    Report Status 02/17/2018 FINAL  Final  Culture, blood (Routine X 2) w Reflex to ID Panel     Status: None (Preliminary result)   Collection Time: 02/16/18  6:21 PM  Result Value Ref Range Status   Specimen Description RIGHT ANTECUBITAL  Final   Special Requests   Final    BOTTLES DRAWN AEROBIC AND ANAEROBIC Blood Culture adequate volume   Culture   Final    NO GROWTH 2 DAYS Performed at [facility], [address] [place], [place] [address]    Report Status PENDING  Incomplete    Radiology Reports Ct Head Wo Contrast  Result Date: 02/16/2018 CLINICAL DATA:  Encephalopathy.  Increased confusion. EXAM: CT HEAD WITHOUT CONTRAST TECHNIQUE: Contiguous axial images were obtained from the base of the skull through the vertex without intravenous contrast. COMPARISON:  Head CT 01/01/2018, brain MRI 12/23/2017 FINDINGS: Despite restraints and technologist directions and repeat apposition, there is moderate motion artifact, patient had difficulty tolerating the exam. Brain: Unchanged atrophy and chronic small vessel ischemia. No evidence of hydrocephalus. No acute intracranial hemorrhage allowing for motion. Left cerebellar infarct evaluation is limited by motion, no evidence of significant progression. Periventricular right frontal lobe infarcts are not as well seen currently. No gross evidence of acute ischemia, detailed evaluation limited. No midline shift or mass effect. Vascular: No obvious hyperdense vessel. Skull: No acute findings. Sinuses/Orbits: Grossly negative. Other: None. IMPRESSION: 1. Significant motion degradation. 2. No gross acute intracranial abnormality. Left cerebellar and right frontal infarcts on prior exam are partially obscured by motion, no evidence of significant progression. Generalized atrophy and chronic small vessel ischemia again seen.  Electronically Signed   By: Keith Rake M.D.   On: 02/16/2018 03:31   US Abdomen Complete  Result Date: 02/16/2018 CLINICAL DATA:  Acute on chronic renal failure. Elevated liver function tests. EXAM: ABDOMEN ULTRASOUND COMPLETE COMPARISON:  CT abdomen 11/18/2016 FINDINGS: The patient was combative and the patient actively resisted ultrasound. As result, a variety of areas normal included on abdominal ultrasound could not be obtained. Gallbladder: The gallbladder was not identified sonographically. It is possible that an existing gallbladder could be obscured by the heavy calcifications along the gallbladder wall shown on the prior CT from 2018. Common bile duct: Diameter: Could not be obtained. Liver: Nodular liver contour compatible with cirrhosis. No discrete focal mass. Portal vein is patent on color Doppler imaging with normal direction of blood flow towards the liver. IVC: Could not be obtained. Pancreas: Could not be obtained. Spleen: Could not be obtained. Right Kidney: Could not be obtained. Left Kidney: Length: Could not be obtained. Abdominal aorta: Could not be obtained. Other findings: None. IMPRESSION: 1. Nodular hepatic contour compatible with cirrhosis. No discrete focal mass is identified. Expected hepatopetal portal venous flow. 2. Gallbladder not well seen. It is possible that existing gallbladder could be obscured by the heavy calcifications shown on the prior CT from 2018. 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 3. The patient was combative and actively resisted sonography. As result, the following areas could not be observed: Common bile duct, IVC, pancreas, spleen, kidneys, and abdominal aorta. Electronically Signed   By: Van Clines M.D.   On: 02/16/2018 12:43   US Renal  Result Date: 02/18/2018 CLINICAL DATA:  68 year old female with acute kidney insufficiency with chronic kidney disease stage 3. 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 Subsequent encounter. EXAM: RENAL / URINARY TRACT ULTRASOUND COMPLETE COMPARISON:  02/16/2018 abdominal sonogram.  11/18/2016 CT. FINDINGS: Right Kidney: Post right nephrectomy. Left Kidney: Renal measurements: 11.9 x 5.8 x 5.9 cm = volume: 212.1 mL. Evaluation limited by patient's habitus, bowel gas and uncooperative state. No obvious mass or hydronephrosis. Bladder: Not imaged as it may be contracted with overlying bowel gas. IMPRESSION: 1. Post right nephrectomy. 2. Evaluation of the left kidney limited by patient's habitus, bowel gas and uncooperative state. No left-sided hydronephrosis or obvious mass noted. 3. Bladder not imaged. This may be related  to contraction in addition to overlying bowel gas. Electronically Signed   By: Genia Del M.D.   On: 02/18/2018 10:49   Dg Chest Port 1 View  Result Date: 02/17/2018 CLINICAL DATA:  68 y/o  F; NG tube. EXAM: PORTABLE CHEST 1 VIEW COMPARISON:  02/15/2018 chest radiograph FINDINGS: Normal cardiac silhouette. Enteric tube is coiling in the lower esophagus and tip appears to be projecting over the right mainstem bronchus in the right lower lung. Clear lungs. No pleural effusion or pneumothorax. No acute osseous abnormality is evident. IMPRESSION: Enteric tube tip projects over the right lower lung in the airways, repositioning recommended. These results will be called to the ordering clinician or representative by the Radiologist Assistant, and communication documented in the PACS or zVision Dashboard. Electronically Signed   By: Kristine Garbe M.D.   On: 02/17/2018 17:35   Dg Chest Port 1 View  Result Date: 02/15/2018 CLINICAL DATA:  Disorientation. EXAM: PORTABLE CHEST 1 VIEW COMPARISON:  12/28/2017 FINDINGS: Calcific atherosclerotic disease of the aorta. There is no evidence of focal airspace consolidation, pleural effusion or pneumothorax. Low lung volumes. Osseous structures are without acute abnormality.  Soft tissues are grossly normal. IMPRESSION: No active disease. Electronically Signed   By: Fidela Salisbury M.D.   On: 02/15/2018 23:17   Dg Chest Port 1v Same Day  Result Date: 02/17/2018 CLINICAL DATA:  Nasogastric tube placement. EXAM: PORTABLE CHEST 1 VIEW COMPARISON:  Chest x-ray from same day at 1653. FINDINGS: Nasogastric tube seen coiled within the stomach, with the tip below the field of view. Stable cardiomediastinal silhouette. Normal pulmonary vascularity. No focal consolidation, pleural effusion, or pneumothorax. No acute osseous abnormality. IMPRESSION: Enteric tube coiled within the stomach with the tip below the field of view. Electronically Signed   By: Titus Dubin M.D.   On: 02/17/2018 18:35     CBC Recent Labs  Lab 02/15/18 2115 02/16/18 0516 02/17/18 1631 02/18/18 0546  WBC 18.8* 17.4* 14.4* 11.8*  HGB 9.0* 8.8* 8.5* 8.2*  HCT 28.7* 28.5* 27.1* 26.0*  PLT 225 222 213 233  MCV 88.6 89.1 89.1 87.2  MCH 27.8 27.5 28.0 27.5  MCHC 31.4 30.9 31.4 31.5  RDW 15.1 15.0 14.9 14.9  LYMPHSABS 2.3 3.8  --   --   MONOABS 1.8* 1.9*  --   --   EOSABS 0.0 0.1  --   --   BASOSABS 0.0 0.1  --   --     Chemistries  Recent Labs  Lab 02/15/18 2115 02/16/18 0516 02/17/18 1631 02/18/18 0546  NA 135 134* 140 140  K 4.7 4.1 4.3 3.5  CL 101 100 109 107  CO2 24 25 16* 23  GLUCOSE 124* 92 54* 101*  BUN 54* 53* 64* 61*  CREATININE 3.47* 3.83* 4.24* 3.99*  CALCIUM 8.3* 8.1* 7.9* 7.9*  MG  --  2.4  --   --   AST 184* 135* 87* 82*  ALT 74* 62* 45* 42  ALKPHOS 63 54 58 64  BILITOT 1.0 0.6 1.3* 0.8   ------------------------------------------------------------------------------------------------------------------ No results for input(s): CHOL, HDL, LDLCALC, TRIG, CHOLHDL, LDLDIRECT in the last 72 hours.  Lab Results  Component Value Date   HGBA1C 6.7 (H) 12/23/2017    ------------------------------------------------------------------------------------------------------------------ Recent Labs    02/16/18 0516  TSH 0.734   ------------------------------------------------------------------------------------------------------------------ Recent Labs    02/16/18 0516  VITAMINB12 217  FERRITIN 455*  TIBC 298  IRON 26*  RETICCTPCT 0.7    Coagulation profile No results for input(s):  INR, PROTIME in the last 168 hours.  No results for input(s): DDIMER in the last 72 hours.  Cardiac Enzymes No results for input(s): CKMB, TROPONINI, MYOGLOBIN in the last 168 hours.  Invalid input(s): CK ------------------------------------------------------------------------------------------------------------------ No results found for: BNP   Roxan Hockey M.D on 02/18/2018 at 1:35 PM  Go to [email or web address] - for contact info  [facility] - Office  [phone]

## 2018-02-19 DIAGNOSIS — N179 Acute kidney failure, unspecified: Secondary | ICD-10-CM

## 2018-02-19 DIAGNOSIS — I5042 Chronic combined systolic (congestive) and diastolic (congestive) heart failure: Secondary | ICD-10-CM

## 2018-02-19 DIAGNOSIS — K7290 Hepatic failure, unspecified without coma: Principal | ICD-10-CM

## 2018-02-19 DIAGNOSIS — R945 Abnormal results of liver function studies: Secondary | ICD-10-CM

## 2018-02-19 DIAGNOSIS — N183 Chronic kidney disease, stage 3 (moderate): Secondary | ICD-10-CM

## 2018-02-19 DIAGNOSIS — I10 Essential (primary) hypertension: Secondary | ICD-10-CM

## 2018-02-19 LAB — COMPREHENSIVE METABOLIC PANEL
ALT: 41 U/L (ref 0–44)
AST: 95 U/L — ABNORMAL HIGH (ref 15–41)
Albumin: 2.5 g/dL — ABNORMAL LOW (ref 3.5–5.0)
Alkaline Phosphatase: 79 U/L (ref 38–126)
Anion gap: 8 (ref 5–15)
BUN: 44 mg/dL — ABNORMAL HIGH (ref 8–23)
CHLORIDE: 108 mmol/L (ref 98–111)
CO2: 25 mmol/L (ref 22–32)
Calcium: 7.9 mg/dL — ABNORMAL LOW (ref 8.9–10.3)
Creatinine, Ser: 3.29 mg/dL — ABNORMAL HIGH (ref 0.44–1.00)
GFR calc Af Amer: 16 mL/min — ABNORMAL LOW (ref >60)
GFR calc non Af Amer: 14 mL/min — ABNORMAL LOW (ref >60)
GLUCOSE: 141 mg/dL — AB (ref 70–99)
Potassium: 4 mmol/L (ref 3.5–5.1)
SODIUM: 141 mmol/L (ref 135–145)
Total Bilirubin: 0.6 mg/dL (ref 0.3–1.2)
Total Protein: 6.8 g/dL (ref 6.5–8.1)

## 2018-02-19 LAB — CBC
HEMATOCRIT: 29.3 % — AB (ref 36.0–46.0)
Hemoglobin: 8.9 g/dL — ABNORMAL LOW (ref 12.0–15.0)
MCH: 27.3 pg (ref 26.0–34.0)
MCHC: 30.4 g/dL (ref 30.0–36.0)
MCV: 89.9 fL (ref 80.0–100.0)
Platelets: 212 10*3/uL (ref 150–400)
RBC: 3.26 MIL/uL — ABNORMAL LOW (ref 3.87–5.11)
RDW: 15.4 % (ref 11.5–15.5)
WBC: 11.2 10*3/uL — ABNORMAL HIGH (ref 4.0–10.5)
nRBC: 0 % (ref 0.0–0.2)

## 2018-02-19 LAB — URINALYSIS, COMPLETE (UACMP) WITH MICROSCOPIC
Bilirubin Urine: NEGATIVE
Glucose, UA: NEGATIVE mg/dL
KETONES UR: NEGATIVE mg/dL
Nitrite: NEGATIVE
Protein, ur: 100 mg/dL — AB
Specific Gravity, Urine: 1.012 (ref 1.005–1.030)
pH: 6 (ref 5.0–8.0)

## 2018-02-19 LAB — AMMONIA: Ammonia: 42 umol/L — ABNORMAL HIGH (ref 9–35)

## 2018-02-19 MED ORDER — CARVEDILOL 3.125 MG PO TABS
6.2500 mg | ORAL_TABLET | Freq: Two times a day (BID) | ORAL | Status: DC
  Administered 2018-02-19 – 2018-02-21 (×4): 6.25 mg via ORAL
  Filled 2018-02-19 (×4): qty 2

## 2018-02-19 MED ORDER — ATORVASTATIN CALCIUM 40 MG PO TABS
40.0000 mg | ORAL_TABLET | Freq: Every day | ORAL | Status: DC
  Administered 2018-02-19 – 2018-02-20 (×2): 40 mg via ORAL
  Filled 2018-02-19 (×2): qty 1

## 2018-02-19 MED ORDER — LACTULOSE 10 GM/15ML PO SOLN
30.0000 g | Freq: Three times a day (TID) | ORAL | Status: DC
  Administered 2018-02-19 – 2018-02-20 (×2): 30 g via ORAL
  Filled 2018-02-19 (×3): qty 60

## 2018-02-19 MED ORDER — LACTULOSE 10 GM/15ML PO SOLN: 20.0000 g | Freq: Three times a day (TID) | ORAL | Status: DC

## 2018-02-19 NOTE — Progress Notes (Signed)
Patient ID: Cassandra Jordan, female   DOB: [birth year], 68 y.o.   MRN: [id number] [facility] Progress Note   Assessment/ Plan:   1.  Acute encephalopathy: Suspected to be predominantly hepatic encephalopathy with her underlying cirrhosis and response to lactulose since admission.  Indeed possible that her acute kidney injury/azotemia may have contributed to this.  This morning, she is more lucid. 2.  Acute kidney injury on chronic kidney disease stage III: In patient with solitary left kidney and baseline chronic kidney disease.  Suspected to have hemodynamically mediated acute kidney injury and overnight on intravenous fluids.  Urine output unfortunately not accurately charted-I see at least 1200 cc of urine in her bag via Foley catheter. 3.  History of CVA: With prior history of carotid stenosis and plans noted for possible CEA in future 4.  History of systolic/diastolic congestive heart failure: At this time appears to be compensated and with her improving oral intake, I will discontinue intravenous fluids at this time. 5.  Leukocytosis: Unclear etiology/source-suspected demargination.  We will continue expectant monitoring  Subjective:   Yesterday removed her nasogastric tube and this has been left out as her ability to eat and drink has improved.   Objective:   BP (!) 153/84   Pulse 100   Temp 98.4 F (36.9 C) (Oral)   Resp (!) 21   Ht 5' 1"  (1.549 m)   Wt 66.9 kg   SpO2 100%   BMI 27.87 kg/m   Intake/Output Summary (Last 24 hours) at 02/19/2018 0908 Last data filed at 02/19/2018 0600 Gross per 24 hour  Intake 1890.4 ml  Output -  Net 1890.4 ml   Weight change:   Physical Exam: Gen: Appears comfortable resting in bed, being fed breakfast CVS: Pulse regular tachycardia, S1 and S2 normal Resp: Coarse/transmitted breath sounds bilaterally without distinct rales Abd: Soft, obese, nontender Ext: Trace lower extremity edema  Imaging: US Renal  Result Date:  02/18/2018 CLINICAL DATA:  68 year old female with acute kidney insufficiency with chronic kidney disease stage 3. 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 Subsequent encounter. EXAM: RENAL / URINARY TRACT ULTRASOUND COMPLETE COMPARISON:  02/16/2018 abdominal sonogram.  11/18/2016 CT. FINDINGS: Right Kidney: Post right nephrectomy. Left Kidney: Renal measurements: 11.9 x 5.8 x 5.9 cm = volume: 212.1 mL. Evaluation limited by patient's habitus, bowel gas and uncooperative state. No obvious mass or hydronephrosis. Bladder: Not imaged as it may be contracted with overlying bowel gas. IMPRESSION: 1. Post right nephrectomy. 2. Evaluation of the left kidney limited by patient's habitus, bowel gas and uncooperative state. No left-sided hydronephrosis or obvious mass noted. 3. Bladder not imaged. This may be related to contraction in addition to overlying bowel gas. Electronically Signed   By: Genia Del M.D.   On: 02/18/2018 10:49   Dg Chest Port 1 View  Result Date: 02/17/2018 CLINICAL DATA:  68 y/o  F; NG tube. EXAM: PORTABLE CHEST 1 VIEW COMPARISON:  02/15/2018 chest radiograph FINDINGS: Normal cardiac silhouette. Enteric tube is coiling in the lower esophagus and tip appears to be projecting over the right mainstem bronchus in the right lower lung. Clear lungs. No pleural effusion or pneumothorax. No acute osseous abnormality is evident. IMPRESSION: Enteric tube tip projects over the right lower lung in the airways, repositioning recommended. These results will be called to the ordering clinician or representative by the Radiologist Assistant, and communication documented in the PACS or zVision Dashboard. Electronically Signed   By: Kristine Garbe M.D.   On:  02/17/2018 17:35   Dg Chest Port 1v Same Day  Result Date: 02/17/2018 CLINICAL DATA:  Nasogastric tube placement. EXAM: PORTABLE CHEST 1 VIEW COMPARISON:  Chest x-ray from same day at 1653. FINDINGS: Nasogastric tube seen coiled within  the stomach, with the tip below the field of view. Stable cardiomediastinal silhouette. Normal pulmonary vascularity. No focal consolidation, pleural effusion, or pneumothorax. No acute osseous abnormality. IMPRESSION: Enteric tube coiled within the stomach with the tip below the field of view. Electronically Signed   By: Titus Dubin M.D.   On: 02/17/2018 18:35    Labs: BMET Recent Labs  Lab 02/15/18 2115 02/16/18 0516 02/17/18 1631 02/18/18 0546  NA 135 134* 140 140  K 4.7 4.1 4.3 3.5  CL 101 100 109 107  CO2 24 25 16* 23  GLUCOSE 124* 92 54* 101*  BUN 54* 53* 64* 61*  CREATININE 3.47* 3.83* 4.24* 3.99*  CALCIUM 8.3* 8.1* 7.9* 7.9*   CBC Recent Labs  Lab 02/15/18 2115 02/16/18 0516 02/17/18 1631 02/18/18 0546  WBC 18.8* 17.4* 14.4* 11.8*  NEUTROABS 14.6* 11.5*  --   --   HGB 9.0* 8.8* 8.5* 8.2*  HCT 28.7* 28.5*  27.7* 27.1* 26.0*  MCV 88.6 89.1 89.1 87.2  PLT 225 222 213 233    Medications:    . aspirin  325 mg Oral Daily  . clopidogrel  75 mg Oral Q breakfast  . feeding supplement (ENSURE ENLIVE)  237 mL Per Tube QID  . heparin  5,000 Units Subcutaneous Q8H  . QUEtiapine  200 mg Oral QHS   Elmarie Shiley, MD 02/19/2018, 9:08 AM

## 2018-02-19 NOTE — Progress Notes (Signed)
RN notified Tylene Fantasia, NP earlier in shift that patient had removed her NG tube and that patient was able to wake up enough to take drink and medications orally as ordered.  Order received to discontinue NG as well as Lactulose syrup.  RN will continue to monitor patient and make MD aware of any changes.   P.J. Linus Mako, RN

## 2018-02-19 NOTE — Progress Notes (Signed)
Patient refusing lactulose tonight due to multiple stools from previous dose, patient educated, mid level aware.

## 2018-02-19 NOTE — Progress Notes (Signed)
PROGRESS NOTE  Cassandra Jordan QQP:[id number] DOB: 1950/03/20 DOA: 02/15/2018 PCP: Abran Richard, MD  Brief History:   68 year old female with a history of chronic hepatitis C, liver cirrhosis, polysubstance abuse including cocaine and alcohol, essential hypertension, renal cell carcinoma status post right nephrectomy, hyperlipidemia, diabetes mellitus type 2, recent admission for ischemic stroke (12/23/17-01/10/2018, and right carotid stenosis and PSVT presented with confusion and agitation from her skilled nursing facility. Per report of SNF personnel, the patient has some baseline confusion, but was more disoriented on the day of admission and became more agitated on the evening of admission.  She was given a dose of Ativan at the nursing facility and has been more calm since then, but remains more confused than is typical for her. In the ED, the patient was afebrile hemodynamically stable.  She was noted to have serum creatinine up to 3.47.  WBC was 18.8.  UDS was negative.  Urinalysis showed 21-50 WBC.  Urine culture was not performed.  The patient was mildly hypotensive with a blood pressure 85/67 but improved with a fluid bolus.  The patient was admitted for altered mental status and acute on chronic renal failure.  Assessment/Plan: Decompensated liver cirrhosis/hepatic encephalopathy -Patient was started on lactulose -02/18/2018 evening--patient pulled out NG -The patient is more somnolent today -Restart lactulose po as the patient is alert enough to be able to take po -A.m. ammonia level -Optimize electrolytes -CT brain negative -Check UA and urine culture  Acute on chronic renal failure--CKD stage III -Baseline creatinine 1.3-1.6 -Serum creatinine peaked at 4.24 -Appreciate nephrology follow-up -Likely due to hemodynamic changes and volume depletion -Has contributed to the patient's encephalopathy -Patient is status post right nephrectomy for renal cell  carcinoma  Chronic hepatitis C -Outpatient follow-up  Recent ischemic CVA/right carotid stenosis -Patient was originally scheduled for right carotid endarterectomy 02/21/2018 which has been rescheduled -Continue aspirin and Plavix -Restart statin  Chronic systolic and diastolic CHF -71/02/4578 echo EF 45-50%, grade 2 DD, HK mid apical anterior lateral, PASP 43, mild MR, trivial TR -Appears clinically euvolemic -Daily weights -Restart carvedilol  Essential hypertension -Restart antihypertensive medications when patient is safe to take po -Restart carvedilol  Diabetes mellitus type 2 -12/23/17 hemoglobin A1c 6.7 -Discontinue NovoLog sliding scale -not on meds as outpt    Disposition Plan:   SNF 2-3 days  Family Communication:   No Family at bedside  Consultants:  renal  Code Status:  DNR  DVT Prophylaxis:  [place] Heparin   Procedures: As Listed in Progress Note Above  Antibiotics: None      Subjective: Patient is somnolent, but she is arousable and follows simple one-step commands.  She denies any headache, chest pain, shortness breath, abdominal pain, nausea, vomiting.  Remainder review of systems unobtainable secondary to patient encephalopathy.  Objective: Vitals:   02/19/18 0146 02/19/18 0504 02/19/18 0828 02/19/18 1400  BP: (!) 156/89 (!) 154/86 (!) 153/84 (!) 155/86  Pulse: 93 95 100 85  Resp:   (!) 21   Temp:  98.4 F (36.9 C)    TempSrc:  Oral    SpO2:  96% 100% 100%  Weight:   66.9 kg   Height:        Intake/Output Summary (Last 24 hours) at 02/19/2018 1707 Last data filed at 02/19/2018 1400 Gross per 24 hour  Intake 1898.15 ml  Output 1350 ml  Net 548.15 ml   Weight change:  Exam:   General:  Pt  is alert, follows commands appropriately, not in acute distress, but somnolent  HEENT: No icterus, No thrush, No neck mass, [place]/AT  Cardiovascular: RRR, S1/S2, no rubs, no gallops  Respiratory: Bibasilar crackles.  No wheezing.  Good air  movement.  Abdomen: Soft/+BS, non tender, non distended, no guarding  Extremities: No edema, No lymphangitis, No petechiae, No rashes, no synovitis   Data Reviewed: I have personally reviewed following labs and imaging studies Basic Metabolic Panel: Recent Labs  Lab 02/15/18 2115 02/16/18 0516 02/17/18 1631 02/18/18 0546 02/19/18 1418  NA 135 134* 140 140 141  K 4.7 4.1 4.3 3.5 4.0  CL 101 100 109 107 108  CO2 24 25 16* 23 25  GLUCOSE 124* 92 54* 101* 141*  BUN 54* 53* 64* 61* 44*  CREATININE 3.47* 3.83* 4.24* 3.99* 3.29*  CALCIUM 8.3* 8.1* 7.9* 7.9* 7.9*  MG  --  2.4  --   --   --    Liver Function Tests: Recent Labs  Lab 02/15/18 2115 02/16/18 0516 02/17/18 1631 02/18/18 0546 02/19/18 1418  AST 184* 135* 87* 82* 95*  ALT 74* 62* 45* 42 41  ALKPHOS 63 54 58 64 79  BILITOT 1.0 0.6 1.3* 0.8 0.6  PROT 7.2 6.5 6.5 6.5 6.8  ALBUMIN 3.0* 2.8* 2.5* 2.5* 2.5*   Recent Labs  Lab 02/15/18 2115  LIPASE 18   Recent Labs  Lab 02/15/18 2115 02/18/18 0910  AMMONIA 45* 33   Coagulation Profile: No results for input(s): INR, PROTIME in the last 168 hours. CBC: Recent Labs  Lab 02/15/18 2115 02/16/18 0516 02/17/18 1631 02/18/18 0546 02/19/18 1418  WBC 18.8* 17.4* 14.4* 11.8* 11.2*  NEUTROABS 14.6* 11.5*  --   --   --   HGB 9.0* 8.8* 8.5* 8.2* 8.9*  HCT 28.7* 28.5*  27.7* 27.1* 26.0* 29.3*  MCV 88.6 89.1 89.1 87.2 89.9  PLT 225 222 213 233 212   Cardiac Enzymes: No results for input(s): CKTOTAL, CKMB, CKMBINDEX, TROPONINI in the last 168 hours. BNP: Invalid input(s): POCBNP CBG: No results for input(s): GLUCAP in the last 168 hours. HbA1C: No results for input(s): HGBA1C in the last 72 hours. Urine analysis:    Component Value Date/Time   COLORURINE AMBER (A) 02/15/2018 2140   APPEARANCEUR HAZY (A) 02/15/2018 2140   LABSPEC 1.017 02/15/2018 2140   PHURINE 7.0 02/15/2018 2140   GLUCOSEU 50 (A) 02/15/2018 2140   HGBUR NEGATIVE 02/15/2018 2140    BILIRUBINUR NEGATIVE 02/15/2018 2140   KETONESUR NEGATIVE 02/15/2018 2140   PROTEINUR >=300 (A) 02/15/2018 2140   UROBILINOGEN 0.2 06/30/2014 2000   NITRITE NEGATIVE 02/15/2018 2140   LEUKOCYTESUR NEGATIVE 02/15/2018 2140   Sepsis Labs: @LABRCNTIP (procalcitonin:4,lacticidven:4) ) Recent Results (from the past 240 hour(s))  Urine culture     Status: None   Collection Time: 02/15/18 10:00 PM  Result Value Ref Range Status   Specimen Description   Final    URINE, CATHETERIZED Performed at [facility], [address] [place], [place] [address]    Special Requests   Final    NONE Performed at [facility], [address] [place], [place] [address]    Culture   Final    NO GROWTH Performed at [facility], [place] [address], [place], [place] [address]    Report Status 02/17/2018 FINAL  Final  Culture, blood (Routine X 2) w Reflex to ID Panel     Status: None (Preliminary result)   Collection Time: 02/16/18  6:21 PM  Result Value Ref Range Status   Specimen Description RIGHT ANTECUBITAL  Final   Special Requests   Final    BOTTLES DRAWN AEROBIC AND ANAEROBIC Blood Culture adequate volume   Culture   Final    NO GROWTH 3 DAYS Performed at [facility], [address] [place], [place] [address]    Report Status PENDING  Incomplete     Scheduled Meds: . aspirin  325 mg Oral Daily  . clopidogrel  75 mg Oral Q breakfast  . feeding supplement (ENSURE ENLIVE)  237 mL Per Tube QID  . heparin  5,000 Units Subcutaneous Q8H  . lactulose  30 g Oral TID  . QUEtiapine  200 mg Oral QHS   Continuous Infusions:  Procedures/Studies: Ct Head Wo Contrast  Result Date: 02/16/2018 CLINICAL DATA:  Encephalopathy.  Increased confusion. EXAM: CT HEAD WITHOUT CONTRAST TECHNIQUE: Contiguous axial images were obtained from the base of the skull through the vertex without intravenous contrast. COMPARISON:  Head CT 01/01/2018, brain MRI 12/23/2017 FINDINGS: Despite restraints and technologist  directions and repeat apposition, there is moderate motion artifact, patient had difficulty tolerating the exam. Brain: Unchanged atrophy and chronic small vessel ischemia. No evidence of hydrocephalus. No acute intracranial hemorrhage allowing for motion. Left cerebellar infarct evaluation is limited by motion, no evidence of significant progression. Periventricular right frontal lobe infarcts are not as well seen currently. No gross evidence of acute ischemia, detailed evaluation limited. No midline shift or mass effect. Vascular: No obvious hyperdense vessel. Skull: No acute findings. Sinuses/Orbits: Grossly negative. Other: None. IMPRESSION: 1. Significant motion degradation. 2. No gross acute intracranial abnormality. Left cerebellar and right frontal infarcts on prior exam are partially obscured by motion, no evidence of significant progression. Generalized atrophy and chronic small vessel ischemia again seen. Electronically Signed   By: Keith Rake M.D.   On: 02/16/2018 03:31   US Abdomen Complete  Result Date: 02/16/2018 CLINICAL DATA:  Acute on chronic renal failure. Elevated liver function tests. EXAM: ABDOMEN ULTRASOUND COMPLETE COMPARISON:  CT abdomen 11/18/2016 FINDINGS: The patient was combative and the patient actively resisted ultrasound. As result, a variety of areas normal included on abdominal ultrasound could not be obtained. Gallbladder: The gallbladder was not identified sonographically. It is possible that an existing gallbladder could be obscured by the heavy calcifications along the gallbladder wall shown on the prior CT from 2018. Common bile duct: Diameter: Could not be obtained. Liver: Nodular liver contour compatible with cirrhosis. No discrete focal mass. Portal vein is patent on color Doppler imaging with normal direction of blood flow towards the liver. IVC: Could not be obtained. Pancreas: Could not be obtained. Spleen: Could not be obtained. Right Kidney: Could not be  obtained. Left Kidney: Length: Could not be obtained. Abdominal aorta: Could not be obtained. Other findings: None. IMPRESSION: 1. Nodular hepatic contour compatible with cirrhosis. No discrete focal mass is identified. Expected hepatopetal portal venous flow. 2. Gallbladder not well seen. It is possible that existing gallbladder could be obscured by the heavy calcifications shown on the prior CT from 2018. 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 3. The patient was combative and actively resisted sonography. As result, the following areas could not be observed: Common bile duct, IVC, pancreas, spleen, kidneys, and abdominal aorta. Electronically Signed   By: Van Clines M.D.   On: 02/16/2018 12:43   US Renal  Result Date: 02/18/2018 CLINICAL DATA:  68 year old female with acute kidney insufficiency with chronic  kidney disease stage 3. 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 Subsequent encounter. EXAM: RENAL / URINARY TRACT ULTRASOUND COMPLETE COMPARISON:  02/16/2018 abdominal sonogram.  11/18/2016 CT. FINDINGS: Right Kidney: Post right nephrectomy. Left Kidney: Renal measurements: 11.9 x 5.8 x 5.9 cm = volume: 212.1 mL. Evaluation limited by patient's habitus, bowel gas and uncooperative state. No obvious mass or hydronephrosis. Bladder: Not imaged as it may be contracted with overlying bowel gas. IMPRESSION: 1. Post right nephrectomy. 2. Evaluation of the left kidney limited by patient's habitus, bowel gas and uncooperative state. No left-sided hydronephrosis or obvious mass noted. 3. Bladder not imaged. This may be related to contraction in addition to overlying bowel gas. Electronically Signed   By: Genia Del M.D.   On: 02/18/2018 10:49   Dg Chest Port 1 View  Result Date: 02/17/2018 CLINICAL DATA:  68 y/o  F; NG tube. EXAM: PORTABLE CHEST 1 VIEW COMPARISON:  02/15/2018 chest radiograph FINDINGS: Normal cardiac silhouette.  Enteric tube is coiling in the lower esophagus and tip appears to be projecting over the right mainstem bronchus in the right lower lung. Clear lungs. No pleural effusion or pneumothorax. No acute osseous abnormality is evident. IMPRESSION: Enteric tube tip projects over the right lower lung in the airways, repositioning recommended. These results will be called to the ordering clinician or representative by the Radiologist Assistant, and communication documented in the PACS or zVision Dashboard. Electronically Signed   By: Kristine Garbe M.D.   On: 02/17/2018 17:35   Dg Chest Port 1 View  Result Date: 02/15/2018 CLINICAL DATA:  Disorientation. EXAM: PORTABLE CHEST 1 VIEW COMPARISON:  12/28/2017 FINDINGS: Calcific atherosclerotic disease of the aorta. There is no evidence of focal airspace consolidation, pleural effusion or pneumothorax. Low lung volumes. Osseous structures are without acute abnormality. Soft tissues are grossly normal. IMPRESSION: No active disease. Electronically Signed   By: Fidela Salisbury M.D.   On: 02/15/2018 23:17   Dg Chest Port 1v Same Day  Result Date: 02/17/2018 CLINICAL DATA:  Nasogastric tube placement. EXAM: PORTABLE CHEST 1 VIEW COMPARISON:  Chest x-ray from same day at 1653. FINDINGS: Nasogastric tube seen coiled within the stomach, with the tip below the field of view. Stable cardiomediastinal silhouette. Normal pulmonary vascularity. No focal consolidation, pleural effusion, or pneumothorax. No acute osseous abnormality. IMPRESSION: Enteric tube coiled within the stomach with the tip below the field of view. Electronically Signed   By: Titus Dubin M.D.   On: 02/17/2018 18:35    Orson Eva, DO  [facility] Pager [phone]  If 7PM-7AM, please contact night-coverage [email or web address] Password [email or web address] 02/19/2018, 5:07 PM   LOS: 2 days

## 2018-02-20 DIAGNOSIS — J690 Pneumonitis due to inhalation of food and vomit: Secondary | ICD-10-CM

## 2018-02-20 LAB — HEPATIC FUNCTION PANEL
ALBUMIN: 2.6 g/dL — AB (ref 3.5–5.0)
ALT: 39 U/L (ref 0–44)
AST: 84 U/L — AB (ref 15–41)
Alkaline Phosphatase: 81 U/L (ref 38–126)
Bilirubin, Direct: 0.3 mg/dL — ABNORMAL HIGH (ref 0.0–0.2)
Indirect Bilirubin: 0.3 mg/dL (ref 0.3–0.9)
Total Bilirubin: 0.6 mg/dL (ref 0.3–1.2)
Total Protein: 6.8 g/dL (ref 6.5–8.1)

## 2018-02-20 LAB — URINE CULTURE: Culture: <10000 — AB

## 2018-02-20 LAB — RENAL FUNCTION PANEL
Albumin: 2.6 g/dL — ABNORMAL LOW (ref 3.5–5.0)
Anion gap: 6 (ref 5–15)
BUN: 41 mg/dL — ABNORMAL HIGH (ref 8–23)
CO2: 27 mmol/L (ref 22–32)
Calcium: 8.4 mg/dL — ABNORMAL LOW (ref 8.9–10.3)
Chloride: 110 mmol/L (ref 98–111)
Creatinine, Ser: 3.06 mg/dL — ABNORMAL HIGH (ref 0.44–1.00)
GFR calc Af Amer: 17 mL/min — ABNORMAL LOW (ref >60)
GFR calc non Af Amer: 15 mL/min — ABNORMAL LOW (ref >60)
Glucose, Bld: 85 mg/dL (ref 70–99)
Phosphorus: 2.4 mg/dL — ABNORMAL LOW (ref 2.5–4.6)
Potassium: 3.7 mmol/L (ref 3.5–5.1)
Sodium: 143 mmol/L (ref 135–145)

## 2018-02-20 LAB — CBC
HCT: 30.4 % — ABNORMAL LOW (ref 36.0–46.0)
Hemoglobin: 9.2 g/dL — ABNORMAL LOW (ref 12.0–15.0)
MCH: 27.6 pg (ref 26.0–34.0)
MCHC: 30.3 g/dL (ref 30.0–36.0)
MCV: 91.3 fL (ref 80.0–100.0)
Platelets: 245 10*3/uL (ref 150–400)
RBC: 3.33 MIL/uL — ABNORMAL LOW (ref 3.87–5.11)
RDW: 15.3 % (ref 11.5–15.5)
WBC: 12.4 10*3/uL — AB (ref 4.0–10.5)
nRBC: 0 % (ref 0.0–0.2)

## 2018-02-20 LAB — AMMONIA: Ammonia: 38 umol/L — ABNORMAL HIGH (ref 9–35)

## 2018-02-20 MED ORDER — AMOXICILLIN-POT CLAVULANATE 500-125 MG PO TABS
1.0000 | ORAL_TABLET | Freq: Two times a day (BID) | ORAL | Status: DC
  Administered 2018-02-20 – 2018-02-21 (×3): 500 mg via ORAL
  Filled 2018-02-20 (×3): qty 1

## 2018-02-20 MED ORDER — RIFAXIMIN 550 MG PO TABS
550.0000 mg | ORAL_TABLET | Freq: Two times a day (BID) | ORAL | Status: DC
  Administered 2018-02-20 – 2018-02-21 (×3): 550 mg via ORAL
  Filled 2018-02-20 (×3): qty 1

## 2018-02-20 MED ORDER — LACTULOSE 10 GM/15ML PO SOLN
20.0000 g | Freq: Two times a day (BID) | ORAL | Status: DC
  Administered 2018-02-20 – 2018-02-21 (×2): 20 g via ORAL
  Filled 2018-02-20 (×2): qty 30

## 2018-02-20 MED ORDER — HALOPERIDOL LACTATE 5 MG/ML IJ SOLN
5.0000 mg | Freq: Once | INTRAMUSCULAR | Status: DC
  Filled 2018-02-20: qty 1

## 2018-02-20 MED ORDER — LORAZEPAM 2 MG/ML IJ SOLN
1.0000 mg | Freq: Once | INTRAMUSCULAR | Status: AC
  Administered 2018-02-20: 1 mg via INTRAVENOUS
  Filled 2018-02-20: qty 1

## 2018-02-20 MED ORDER — HALOPERIDOL LACTATE 5 MG/ML IJ SOLN: 5.0000 mg | Freq: Once | INTRAMUSCULAR | Status: DC | PRN

## 2018-02-20 MED ORDER — NICOTINE 21 MG/24HR TD PT24
21.0000 mg | MEDICATED_PATCH | Freq: Every day | TRANSDERMAL | Status: DC
  Administered 2018-02-21: 21 mg via TRANSDERMAL
  Filled 2018-02-20: qty 1

## 2018-02-20 NOTE — Progress Notes (Signed)
PROGRESS NOTE  Cassandra Jordan YBO:[id number] DOB: [birth year] DOA: 02/15/2018 PCP: Abran Richard, MD  Brief History:   68 year old female with a history of chronic hepatitis C, liver cirrhosis, polysubstance abuse including cocaine and alcohol, essential hypertension, renal cell carcinoma status post right nephrectomy, hyperlipidemia, diabetes mellitus type 2, recent admission for ischemic stroke (12/23/17-01/10/2018, and right carotid stenosis and PSVT presented with confusion and agitation from her skilled nursing facility. Per report of SNF personnel, the patient has some baseline confusion, but was more disoriented on the day of admission and became more agitated on the evening of admission.  She was given a dose of Ativan at the nursing facility and has been more calm since then, but remains more confused than is typical for her. In the ED, the patient was afebrile hemodynamically stable.  She was noted to have serum creatinine up to 3.47.  WBC was 18.8.  UDS was negative.  Urinalysis showed 21-50 WBC.  Urine culture was not performed.  The patient was mildly hypotensive with a blood pressure 85/67 but improved with a fluid bolus.  The patient was admitted for altered mental status and acute on chronic renal failure.  Assessment/Plan: Decompensated liver cirrhosis/hepatic encephalopathy -Patient was started on lactulose -02/18/2018 evening--patient pulled out NG -The patient is more alert today -Patient intermittently refusing lactulose -Start rifaximin -A.m. ammonia level 33>>> 42>>>38 -Optimize electrolytes -CT brain negative  Acute on chronic renal failure--CKD stage III -Baseline creatinine 1.3-1.6 -Serum creatinine peaked at 4.24 -Appreciate nephrology follow-up -Likely due to hemodynamic changes and volume depletion -Has contributed to the patient's encephalopathy -Patient is status post right nephrectomy for renal cell carcinoma  Aspiration pneumonitis -Start  Augmentin  Chronic hepatitis C -Outpatient follow-up  Recent ischemic CVA/right carotid stenosis -Patient was originally scheduled for right carotid endarterectomy 02/21/2018 which has been rescheduled -Continue aspirin and Plavix -Restart statin  Chronic systolic and diastolic CHF -27/07/8240 echo EF 45-50%, grade 2 DD, HK mid apical anterior lateral, PASP 43, mild MR, trivial TR -Appears clinically euvolemic -Daily weights -Restart carvedilol  Essential hypertension -Restart antihypertensive medications when patient is safe to take po -Restart carvedilol  Diabetes mellitus type 2 -12/23/17 hemoglobin A1c 6.7 -Discontinue NovoLog sliding scale -not on meds as outpt    Disposition Plan:   SNF 1-2  days  Family Communication:   No Family at bedside  Consultants:  renal  Code Status:  DNR  DVT Prophylaxis:  [place] Heparin   Procedures: As Listed in Progress Note Above  Antibiotics: Amox/clav 1/30>>>         Subjective: Patient is more alert today.  She refused to take the lactulose because it was causing too many loose stools.  She denies any abdominal pain, nausea, vomiting, chest pain, coughing, hemoptysis.  She has intermittent shortness of breath.  Objective: Vitals:   02/19/18 1700 02/19/18 2143 02/20/18 0446 02/20/18 0500  BP:  140/86 (!) 153/78   Pulse:  81 80   Resp:  18 18   Temp: 97.6 F (36.4 C) 97.7 F (36.5 C) 98.4 F (36.9 C)   TempSrc: Axillary Oral Oral   SpO2:  95% 94%   Weight:    65 kg  Height:        Intake/Output Summary (Last 24 hours) at 02/20/2018 1153 Last data filed at 02/20/2018 1150 Gross per 24 hour  Intake 360 ml  Output 2000 ml  Net -1640 ml   Weight change:  Exam:  General:  Pt is alert, follows commands appropriately, not in acute distress  HEENT: No icterus, No thrush, No neck mass, [place]/AT  Cardiovascular: RRR, S1/S2, no rubs, no gallops  Respiratory: Bilateral scattered rales.  No wheezing.   Good air movement.  Abdomen: Soft/+BS, non tender, non distended, no guarding  Extremities: No edema, No lymphangitis, No petechiae, No rashes, no synovitis   Data Reviewed: I have personally reviewed following labs and imaging studies Basic Metabolic Panel: Recent Labs  Lab 02/16/18 0516 02/17/18 1631 02/18/18 0546 02/19/18 1418 02/20/18 0600  NA 134* 140 140 141 143  K 4.1 4.3 3.5 4.0 3.7  CL 100 109 107 108 110  CO2 25 16* 23 25 27   GLUCOSE 92 54* 101* 141* 85  BUN 53* 64* 61* 44* 41*  CREATININE 3.83* 4.24* 3.99* 3.29* 3.06*  CALCIUM 8.1* 7.9* 7.9* 7.9* 8.4*  MG 2.4  --   --   --   --   PHOS  --   --   --   --  2.4*   Liver Function Tests: Recent Labs  Lab 02/16/18 0516 02/17/18 1631 02/18/18 0546 02/19/18 1418 02/20/18 0600  AST 135* 87* 82* 95* 84*  ALT 62* 45* 42 41 39  ALKPHOS 54 58 64 79 81  BILITOT 0.6 1.3* 0.8 0.6 0.6  PROT 6.5 6.5 6.5 6.8 6.8  ALBUMIN 2.8* 2.5* 2.5* 2.5* 2.6*  2.6*   Recent Labs  Lab 02/15/18 2115  LIPASE 18   Recent Labs  Lab 02/15/18 2115 02/18/18 0910 02/19/18 1639 02/20/18 0600  AMMONIA 45* 33 42* 38*   Coagulation Profile: No results for input(s): INR, PROTIME in the last 168 hours. CBC: Recent Labs  Lab 02/15/18 2115 02/16/18 0516 02/17/18 1631 02/18/18 0546 02/19/18 1418 02/20/18 0600  WBC 18.8* 17.4* 14.4* 11.8* 11.2* 12.4*  NEUTROABS 14.6* 11.5*  --   --   --   --   HGB 9.0* 8.8* 8.5* 8.2* 8.9* 9.2*  HCT 28.7* 28.5*  27.7* 27.1* 26.0* 29.3* 30.4*  MCV 88.6 89.1 89.1 87.2 89.9 91.3  PLT 225 222 213 233 212 245   Cardiac Enzymes: No results for input(s): CKTOTAL, CKMB, CKMBINDEX, TROPONINI in the last 168 hours. BNP: Invalid input(s): POCBNP CBG: No results for input(s): GLUCAP in the last 168 hours. HbA1C: No results for input(s): HGBA1C in the last 72 hours. Urine analysis:    Component Value Date/Time   COLORURINE YELLOW 02/19/2018 [place] 02/19/2018 1615   LABSPEC 1.012  02/19/2018 1615   PHURINE 6.0 02/19/2018 1615   GLUCOSEU NEGATIVE 02/19/2018 1615   HGBUR MODERATE (A) 02/19/2018 1615   BILIRUBINUR NEGATIVE 02/19/2018 1615   KETONESUR NEGATIVE 02/19/2018 1615   PROTEINUR 100 (A) 02/19/2018 1615   UROBILINOGEN 0.2 06/30/2014 2000   NITRITE NEGATIVE 02/19/2018 1615   LEUKOCYTESUR TRACE (A) 02/19/2018 1615   Sepsis Labs: @LABRCNTIP (procalcitonin:4,lacticidven:4) ) Recent Results (from the past 240 hour(s))  Urine culture     Status: None   Collection Time: 02/15/18 10:00 PM  Result Value Ref Range Status   Specimen Description   Final    URINE, CATHETERIZED Performed at [facility], [address], [place], [place] [address]    Special Requests   Final    NONE Performed at [facility], [address], [place], [place] [address]    Culture   Final    NO GROWTH Performed at [facility], [place] [address], [place], [place] [address]  Report Status 02/17/2018 FINAL  Final  Culture, blood (Routine X 2) w Reflex to ID Panel     Status: None (Preliminary result)   Collection Time: 02/16/18  6:21 PM  Result Value Ref Range Status   Specimen Description RIGHT ANTECUBITAL  Final   Special Requests   Final    BOTTLES DRAWN AEROBIC AND ANAEROBIC Blood Culture adequate volume   Culture   Final    NO GROWTH 4 DAYS Performed at [facility], [address], [place], [place] [address]    Report Status PENDING  Incomplete     Scheduled Meds: . aspirin  325 mg Oral Daily  . atorvastatin  40 mg Oral q1800  . carvedilol  6.25 mg Oral BID WC  . clopidogrel  75 mg Oral Q breakfast  . feeding supplement (ENSURE ENLIVE)  237 mL Per Tube QID  . heparin  5,000 Units Subcutaneous Q8H  . lactulose  30 g Oral TID  . QUEtiapine  200 mg Oral QHS   Continuous Infusions:  Procedures/Studies: Ct Head Wo Contrast  Result Date: 02/16/2018 CLINICAL DATA:  Encephalopathy.  Increased confusion. EXAM: CT HEAD WITHOUT CONTRAST TECHNIQUE: Contiguous axial  images were obtained from the base of the skull through the vertex without intravenous contrast. COMPARISON:  Head CT 01/01/2018, brain MRI 12/23/2017 FINDINGS: Despite restraints and technologist directions and repeat apposition, there is moderate motion artifact, patient had difficulty tolerating the exam. Brain: Unchanged atrophy and chronic small vessel ischemia. No evidence of hydrocephalus. No acute intracranial hemorrhage allowing for motion. Left cerebellar infarct evaluation is limited by motion, no evidence of significant progression. Periventricular right frontal lobe infarcts are not as well seen currently. No gross evidence of acute ischemia, detailed evaluation limited. No midline shift or mass effect. Vascular: No obvious hyperdense vessel. Skull: No acute findings. Sinuses/Orbits: Grossly negative. Other: None. IMPRESSION: 1. Significant motion degradation. 2. No gross acute intracranial abnormality. Left cerebellar and right frontal infarcts on prior exam are partially obscured by motion, no evidence of significant progression. Generalized atrophy and chronic small vessel ischemia again seen. Electronically Signed   By: Keith Rake M.D.   On: 02/16/2018 03:31   US Abdomen Complete  Result Date: 02/16/2018 CLINICAL DATA:  Acute on chronic renal failure. Elevated liver function tests. EXAM: ABDOMEN ULTRASOUND COMPLETE COMPARISON:  CT abdomen 11/18/2016 FINDINGS: The patient was combative and the patient actively resisted ultrasound. As result, a variety of areas normal included on abdominal ultrasound could not be obtained. Gallbladder: The gallbladder was not identified sonographically. It is possible that an existing gallbladder could be obscured by the heavy calcifications along the gallbladder wall shown on the prior CT from 2018. Common bile duct: Diameter: Could not be obtained. Liver: Nodular liver contour compatible with cirrhosis. No discrete focal mass. Portal vein is patent on  color Doppler imaging with normal direction of blood flow towards the liver. IVC: Could not be obtained. Pancreas: Could not be obtained. Spleen: Could not be obtained. Right Kidney: Could not be obtained. Left Kidney: Length: Could not be obtained. Abdominal aorta: Could not be obtained. Other findings: None. IMPRESSION: 1. Nodular hepatic contour compatible with cirrhosis. No discrete focal mass is identified. Expected hepatopetal portal venous flow. 2. Gallbladder not well seen. It is possible that existing gallbladder could be obscured by the heavy calcifications shown on the prior CT from 2018. 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 3. The patient was combative and actively resisted sonography. As  result, the following areas could not be observed: Common bile duct, IVC, pancreas, spleen, kidneys, and abdominal aorta. Electronically Signed   By: Van Clines M.D.   On: 02/16/2018 12:43   US Renal  Result Date: 02/18/2018 CLINICAL DATA:  68 year old female with acute kidney insufficiency with chronic kidney disease stage 3. 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 Subsequent encounter. EXAM: RENAL / URINARY TRACT ULTRASOUND COMPLETE COMPARISON:  02/16/2018 abdominal sonogram.  11/18/2016 CT. FINDINGS: Right Kidney: Post right nephrectomy. Left Kidney: Renal measurements: 11.9 x 5.8 x 5.9 cm = volume: 212.1 mL. Evaluation limited by patient's habitus, bowel gas and uncooperative state. No obvious mass or hydronephrosis. Bladder: Not imaged as it may be contracted with overlying bowel gas. IMPRESSION: 1. Post right nephrectomy. 2. Evaluation of the left kidney limited by patient's habitus, bowel gas and uncooperative state. No left-sided hydronephrosis or obvious mass noted. 3. Bladder not imaged. This may be related to contraction in addition to overlying bowel gas. Electronically Signed   By: Genia Del M.D.   On: 02/18/2018 10:49    Dg Chest Port 1 View  Result Date: 02/17/2018 CLINICAL DATA:  68 y/o  F; NG tube. EXAM: PORTABLE CHEST 1 VIEW COMPARISON:  02/15/2018 chest radiograph FINDINGS: Normal cardiac silhouette. Enteric tube is coiling in the lower esophagus and tip appears to be projecting over the right mainstem bronchus in the right lower lung. Clear lungs. No pleural effusion or pneumothorax. No acute osseous abnormality is evident. IMPRESSION: Enteric tube tip projects over the right lower lung in the airways, repositioning recommended. These results will be called to the ordering clinician or representative by the Radiologist Assistant, and communication documented in the PACS or zVision Dashboard. Electronically Signed   By: Kristine Garbe M.D.   On: 02/17/2018 17:35   Dg Chest Port 1 View  Result Date: 02/15/2018 CLINICAL DATA:  Disorientation. EXAM: PORTABLE CHEST 1 VIEW COMPARISON:  12/28/2017 FINDINGS: Calcific atherosclerotic disease of the aorta. There is no evidence of focal airspace consolidation, pleural effusion or pneumothorax. Low lung volumes. Osseous structures are without acute abnormality. Soft tissues are grossly normal. IMPRESSION: No active disease. Electronically Signed   By: Fidela Salisbury M.D.   On: 02/15/2018 23:17   Dg Chest Port 1v Same Day  Result Date: 02/17/2018 CLINICAL DATA:  Nasogastric tube placement. EXAM: PORTABLE CHEST 1 VIEW COMPARISON:  Chest x-ray from same day at 1653. FINDINGS: Nasogastric tube seen coiled within the stomach, with the tip below the field of view. Stable cardiomediastinal silhouette. Normal pulmonary vascularity. No focal consolidation, pleural effusion, or pneumothorax. No acute osseous abnormality. IMPRESSION: Enteric tube coiled within the stomach with the tip below the field of view. Electronically Signed   By: Titus Dubin M.D.   On: 02/17/2018 18:35    Orson Eva, DO  [facility] Pager [phone]  If 7PM-7AM, please contact  night-coverage [email or web address] Password [email or web address] 02/20/2018, 11:53 AM   LOS: 3 days

## 2018-02-20 NOTE — Plan of Care (Signed)
  Problem: Acute Rehab PT Goals(only PT should resolve) Goal: Pt Will Go Supine/Side To Sit Outcome: Progressing Flowsheets (Taken 02/20/2018 1442) Pt will go Supine/Side to Sit: with supervision Goal: Patient Will Transfer Sit To/From Stand Outcome: Progressing Flowsheets (Taken 02/20/2018 1442) Patient will transfer sit to/from stand: with min guard assist Note:  with RW Goal: Pt Will Transfer Bed To Chair/Chair To Bed Outcome: Progressing Flowsheets (Taken 02/20/2018 1442) Pt will Transfer Bed to Chair/Chair to Bed: min guard assist Note:  With RW Goal: Pt Will Ambulate Outcome: Progressing Flowsheets (Taken 02/20/2018 1442) Pt will Ambulate: 25 feet; with min guard assist; with rolling walker   2:42 PM, 02/20/18 Talbot Grumbling, DPT Physical Therapist with [facility] [phone] office

## 2018-02-20 NOTE — Evaluation (Signed)
Physical Therapy Evaluation Patient Details Name: Cassandra Jordan MRN: [id number] DOB: [birth year] Today's Date: 02/20/2018   History of Present Illness  Cassandra Jordan is a 68 y.o. female with medical history significant for hepatitis C with cirrhosis, cocaine abuse, hypertension, chronic kidney disease stage III, carotid artery stenosis, and recent admission for acute CVA, now presenting from her nursing facility for evaluation of increased confusion and agitation.  Per report of SNF personnel, the patient has some baseline confusion, but was more disoriented today and became agitated this evening.  She was given a dose of Ativan at the nursing facility and has been more calm since then, but remains more confused than is typical for her.  Patient has not expressed any specific complaints.  She is unable to contribute to the history due to her clinical condition.  She is scheduled for right carotid endarterectomy in about a week.    Clinical Impression  Patient presents supine in bed, pleasant, and agreeable to PT evaluation. Patient tolerates bed mobility with increased time, use of bed rail, use of elevated head of bed and min guard assist. Patient limited with gait due to bowel movements, but able to take steps at bedside demonstrating unsteadiness on feet, requiring assistance with RW and shuffling pattern. Patient requires assistance with transfers for rising from seated surfaces and steadying upon standing. Patient limited by frequent bowel movements confining patient to room and fatigue with movement requiring frequent seated rest breaks. Patient disoriented to date, situation, and location and unable to recall when reoriented, but pleasant, oriented, follows commands appropriately and answers questions appropriately. Patient left supine in bed with bed alarm on and call bell in hand.    Follow Up Recommendations SNF    Equipment Recommendations  Rolling walker with 5" wheels;3in1 (PT)(if  patient doesn't already have equipment)    Recommendations for Other Services       Precautions / Restrictions Precautions Precautions: Fall Restrictions Weight Bearing Restrictions: No      Mobility  Bed Mobility Overal bed mobility: Needs Assistance Bed Mobility: Supine to Sit;Sit to Supine     Supine to sit: Min guard Sit to supine: Min guard   General bed mobility comments: increased time, use of bed rail, elevated HOB, verbal cues for hand placement for safety  Transfers Overall transfer level: Needs assistance Equipment used: Rolling walker (2 wheeled) Transfers: Sit to/from [facility] Sit to Stand: Min assist Stand pivot transfers: Min assist       General transfer comment: unsteadiness on feet requiring steadying assistance and assistance for RW management  Ambulation/Gait Ambulation/Gait assistance: Mod assist Gait Distance (Feet): 4 Feet Assistive device: Rolling walker (2 wheeled) Gait Pattern/deviations: Decreased step length - right;Decreased step length - left;Decreased stride length;Shuffle Gait velocity: decreased   General Gait Details: unsteadiness on feet with labored, slow, shuffling 4-5 steps at bedside, no loss of balance  Stairs            Wheelchair Mobility    Modified Rankin (Stroke Patients Only)       Balance Overall balance assessment: Needs assistance Sitting-balance support: Feet supported;Bilateral upper extremity supported Sitting balance-Leahy Scale: Fair Sitting balance - Comments: seated edge of bed   Standing balance support: During functional activity;Bilateral upper extremity supported Standing balance-Leahy Scale: Poor Standing balance comment: min/mod assist with RW                             Pertinent  Vitals/Pain Pain Assessment: No/denies pain    Home Living Family/patient expects to be discharged to:: Skilled nursing facility(No family at bedside, information obtained from  disoriented patient and admission paperwork.)                 Additional Comments: LTC resident at [facility] in [place], [place]    Prior Function Level of Independence: Independent with assistive device(s)         Comments: Patient reports occasional SPC use for household and community distances. Patient reports being independent with all ADLs.     Hand Dominance        Extremity/Trunk Assessment   Upper Extremity Assessment Upper Extremity Assessment: Generalized weakness    Lower Extremity Assessment Lower Extremity Assessment: Generalized weakness    Cervical / Trunk Assessment Cervical / Trunk Assessment: Normal  Communication   Communication: No difficulties  Cognition Arousal/Alertness: Awake/alert Behavior During Therapy: WFL for tasks assessed/performed Overall Cognitive Status: No family/caregiver present to determine baseline cognitive functioning                                 General Comments: Patient oriented to self, but disoriented to date, situation, or location. [place] her sister next door and asks why she is in the hospital when oriented to location and situation.      General Comments      Exercises     Assessment/Plan    PT Assessment Patient needs continued PT services  PT Problem List Decreased strength;Decreased activity tolerance;Decreased balance;Decreased mobility       PT Treatment Interventions Gait training;Functional mobility training;Therapeutic activities;Therapeutic exercise;Patient/family education    PT Goals (Current goals can be found in the Care Plan section)  Acute Rehab PT Goals Patient Stated Goal: return home with family support PT Goal Formulation: With patient Time For Goal Achievement: 03/06/18 Potential to Achieve Goals: Good    Frequency Min 3X/week   Barriers to discharge        Co-evaluation               AM-PAC PT "6  Clicks" Mobility  Outcome Measure Help needed turning from your back to your side while in a flat bed without using bedrails?: A Little Help needed moving from lying on your back to sitting on the side of a flat bed without using bedrails?: A Little Help needed moving to and from a bed to a chair (including a wheelchair)?: A Little Help needed standing up from a chair using your arms (e.g., wheelchair or bedside chair)?: A Little Help needed to walk in hospital room?: A Lot Help needed climbing 3-5 steps with a railing? : Total 6 Click Score: 15    End of Session Equipment Utilized During Treatment: Oxygen(1.5 LPM) Activity Tolerance: Patient tolerated treatment well;Patient limited by fatigue Patient left: in bed;with call bell/phone within reach;with bed alarm set Nurse Communication: Mobility status PT Visit Diagnosis: Unsteadiness on feet (R26.81);Other abnormalities of gait and mobility (R26.89);Muscle weakness (generalized) (M62.81)    Time: 5859-2924 PT Time Calculation (min) (ACUTE ONLY): 21 min   Charges:   PT Evaluation $PT Eval Moderate Complexity: 1 Mod          2:41 PM, 02/20/18 Talbot Grumbling, DPT Physical Therapist with [facility] [phone] office

## 2018-02-20 NOTE — Care Management Important Message (Signed)
Important Message  Patient Details  Name: Cassandra Jordan MRN: [id number] Date of Birth: [birth year]   Medicare Important Message Given:  Yes    Tommy Medal 02/20/2018, 10:46 AM

## 2018-02-20 NOTE — Progress Notes (Signed)
Patient seems to be calm at this time. Paged Mid Level to change one time dose of Haldol to PRN. Will continue to monitor throughout shift.

## 2018-02-20 NOTE — Progress Notes (Signed)
Patient attempting to get out of bed multiple times. Unable to reorient patient. Patient stated she wants a ride home. Attempted to reorient patient that she is in the hospital. Patient had one time dose of Ativan see MAR for correct dosage with no effect. Bed alarm placed. Paged Mid Level. Mid level placed order for haldol 28m IV one time dose. Will continue to monitor throughout shift.

## 2018-02-20 NOTE — Progress Notes (Signed)
Patient moved closer to the nursing station to room 301 as she is constantly trying to get out of the bed, posing the risk for a fall. 0.5 mg of Ativan given that is ordered, no effect. Consulted the MD, stated to give 67m of Ativan once, and then re-assess the need to a potential sitter. Will pass on to night shift RN.

## 2018-02-20 NOTE — Progress Notes (Signed)
Patient ID: Cassandra Jordan, female   DOB: 1950/04/10, 68 y.o.   MRN: [id number] [facility] Progress Note   Assessment/ Plan:   1.  Acute encephalopathy: Primarily from hepatic encephalopathy with minimal contribution from azotemia.  Improving status post lactulose.  Anticipate evaluation by PT/OT today. 2.  Acute kidney injury on chronic kidney disease stage III: In patient with solitary left kidney and baseline chronic kidney disease stage III (creatinine 1.3-1.5).  Likely hemodynamically mediated AKI with possible mild ATN that has improved with intravenous fluids and currently with excellent urine output.  She remains off fluids for the past 24 hours with improving creatinine, continue to encourage oral intake.  Continue daily labs to monitor renal recovery. 3.  History of CVA: With prior history of carotid stenosis and plans noted for possible CEA in future 4.  History of systolic/diastolic congestive heart failure: At this time appears to be compensated.  No indications for diuretics. 5.  Leukocytosis: Unclear etiology-still with low-grade leukocytosis but without any focal symptoms or signs of infection.  Subjective:   Improving mental status noted overnight, she refused lactulose due to diarrhea.   Objective:   BP (!) 153/78   Pulse 80   Temp 98.4 F (36.9 C) (Oral)   Resp 18   Ht 5' 1"  (1.549 m)   Wt 65 kg   SpO2 94%   BMI 27.08 kg/m   Intake/Output Summary (Last 24 hours) at 02/20/2018 0854 Last data filed at 02/20/2018 0500 Gross per 24 hour  Intake 237 ml  Output 2650 ml  Net -2413 ml   Weight change:   Physical Exam: Gen: Appears comfortable resting in bed, listening to television CVS: Pulse regular tachycardia, S1 and S2 normal Resp: Coarse breath sounds anteriorly without any distinct rales or rhonchi Abd: Soft, obese, nontender Ext: Trace lower extremity edema  Imaging: US Renal  Result Date: 02/18/2018 CLINICAL DATA:  68 year old female with  acute kidney insufficiency with chronic kidney disease stage 3. 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 Subsequent encounter. EXAM: RENAL / URINARY TRACT ULTRASOUND COMPLETE COMPARISON:  02/16/2018 abdominal sonogram.  11/18/2016 CT. FINDINGS: Right Kidney: Post right nephrectomy. Left Kidney: Renal measurements: 11.9 x 5.8 x 5.9 cm = volume: 212.1 mL. Evaluation limited by patient's habitus, bowel gas and uncooperative state. No obvious mass or hydronephrosis. Bladder: Not imaged as it may be contracted with overlying bowel gas. IMPRESSION: 1. Post right nephrectomy. 2. Evaluation of the left kidney limited by patient's habitus, bowel gas and uncooperative state. No left-sided hydronephrosis or obvious mass noted. 3. Bladder not imaged. This may be related to contraction in addition to overlying bowel gas. Electronically Signed   By: Genia Del M.D.   On: 02/18/2018 10:49    Labs: BMET Recent Labs  Lab 02/15/18 2115 02/16/18 0516 02/17/18 1631 02/18/18 0546 02/19/18 1418 02/20/18 0600  NA 135 134* 140 140 141 143  K 4.7 4.1 4.3 3.5 4.0 3.7  CL 101 100 109 107 108 110  CO2 24 25 16* 23 25 27   GLUCOSE 124* 92 54* 101* 141* 85  BUN 54* 53* 64* 61* 44* 41*  CREATININE 3.47* 3.83* 4.24* 3.99* 3.29* 3.06*  CALCIUM 8.3* 8.1* 7.9* 7.9* 7.9* 8.4*  PHOS  --   --   --   --   --  2.4*   CBC Recent Labs  Lab 02/15/18 2115 02/16/18 0516 02/17/18 1631 02/18/18 0546 02/19/18 1418 02/20/18 0600  WBC 18.8* 17.4* 14.4* 11.8* 11.2* 12.4*  NEUTROABS 14.6* 11.5*  --   --   --   --   HGB 9.0* 8.8* 8.5* 8.2* 8.9* 9.2*  HCT 28.7* 28.5*  27.7* 27.1* 26.0* 29.3* 30.4*  MCV 88.6 89.1 89.1 87.2 89.9 91.3  PLT 225 222 213 233 212 245    Medications:    . aspirin  325 mg Oral Daily  . atorvastatin  40 mg Oral q1800  . carvedilol  6.25 mg Oral BID WC  . clopidogrel  75 mg Oral Q breakfast  . feeding supplement (ENSURE ENLIVE)  237 mL Per Tube QID  . heparin  5,000 Units Subcutaneous Q8H   . lactulose  30 g Oral TID  . QUEtiapine  200 mg Oral QHS   Elmarie Shiley, MD 02/20/2018, 8:54 AM

## 2018-02-21 ENCOUNTER — Inpatient Hospital Stay (HOSPITAL_COMMUNITY): Admit: 2018-02-21 | Payer: Medicare Other | Admitting: Vascular Surgery

## 2018-02-21 ENCOUNTER — Encounter (HOSPITAL_COMMUNITY): Payer: Self-pay

## 2018-02-21 LAB — CULTURE, BLOOD (ROUTINE X 2)
Culture: NO GROWTH
Special Requests: ADEQUATE

## 2018-02-21 LAB — CBC
HCT: 34 % — ABNORMAL LOW (ref 36.0–46.0)
Hemoglobin: 10.2 g/dL — ABNORMAL LOW (ref 12.0–15.0)
MCH: 27.5 pg (ref 26.0–34.0)
MCHC: 30 g/dL (ref 30.0–36.0)
MCV: 91.6 fL (ref 80.0–100.0)
Platelets: 265 10*3/uL (ref 150–400)
RBC: 3.71 MIL/uL — ABNORMAL LOW (ref 3.87–5.11)
RDW: 15 % (ref 11.5–15.5)
WBC: 10.6 10*3/uL — ABNORMAL HIGH (ref 4.0–10.5)
nRBC: 0 % (ref 0.0–0.2)

## 2018-02-21 LAB — RENAL FUNCTION PANEL
Albumin: 2.7 g/dL — ABNORMAL LOW (ref 3.5–5.0)
Anion gap: 10 (ref 5–15)
BUN: 34 mg/dL — ABNORMAL HIGH (ref 8–23)
CALCIUM: 8.4 mg/dL — AB (ref 8.9–10.3)
CO2: 25 mmol/L (ref 22–32)
Chloride: 108 mmol/L (ref 98–111)
Creatinine, Ser: 2.67 mg/dL — ABNORMAL HIGH (ref 0.44–1.00)
GFR calc Af Amer: 21 mL/min — ABNORMAL LOW (ref >60)
GFR calc non Af Amer: 18 mL/min — ABNORMAL LOW (ref >60)
GLUCOSE: 98 mg/dL (ref 70–99)
Phosphorus: 2.5 mg/dL (ref 2.5–4.6)
Potassium: 3.8 mmol/L (ref 3.5–5.1)
Sodium: 143 mmol/L (ref 135–145)

## 2018-02-21 LAB — AMMONIA
Ammonia: 51 umol/L — ABNORMAL HIGH (ref 9–35)
Ammonia: 36 umol/L — ABNORMAL HIGH (ref 9–35)

## 2018-02-21 SURGERY — ENDARTERECTOMY, CAROTID
Anesthesia: General | Laterality: Right

## 2018-02-21 MED ORDER — AMLODIPINE BESYLATE 5 MG PO TABS
5.0000 mg | ORAL_TABLET | Freq: Every day | ORAL | Status: DC
  Administered 2018-02-21: 5 mg via ORAL
  Filled 2018-02-21: qty 1

## 2018-02-21 MED ORDER — LACTULOSE 10 GM/15ML PO SOLN: 20.0000 g | Freq: Two times a day (BID) | ORAL | 1 refills | Status: AC

## 2018-02-21 MED ORDER — AMOXICILLIN-POT CLAVULANATE 500-125 MG PO TABS: 1.0000 | ORAL_TABLET | Freq: Two times a day (BID) | ORAL | 0 refills | Status: DC

## 2018-02-21 MED ORDER — LORAZEPAM 2 MG/ML IJ SOLN: 2.0000 mg | Freq: Once | INTRAMUSCULAR | 0 refills | Status: AC

## 2018-02-21 MED ORDER — RIFAXIMIN 550 MG PO TABS: 550.0000 mg | ORAL_TABLET | Freq: Two times a day (BID) | ORAL | 0 refills | Status: AC

## 2018-02-21 NOTE — Clinical Social Work Note (Signed)
Facility has auth and discharge clinicals have been sent.  Message left for patient's daughter advising of discharge.  RN to call EMS when ready.   LCSW signing off.     Russel Morain, Clydene Pugh, LCSW

## 2018-02-21 NOTE — Progress Notes (Signed)
[facility] NEPHROLOGY PROGRESS NOTE  Assessment/ Plan: Pt is a 68 y.o. yo female with history of chronic hepatitis C, liver cirrhosis, polysubstance abuse including cocaine alcohol, hypertension, renal cell carcinoma status post right nephrectomy, diabetes, stroke, admitted with change in mental status, consulted for acute kidney injury.  #Acute kidney injury on CKD stage III and solitary left kidney with baseline serum creatinine level around 1.3-1.5.  AKI likely hemodynamically mediated that improved with IV fluid.  Patient is off IV fluid.  Serum creatinine level continue to improve to 2.6 today with urine output around 2.4 L in 24 hours.  Ultrasound left kidney with no hydronephrosis.  Continue to monitor urine output and BMP.  #Acute encephalopathy, hepatic.  Mental status improving.  Minimize sedatives.  #History of CHF: Looks compensated at this time.  Not on diuretics.  #Chronic anemia: Hemoglobin 10.2.  Monitor.  #Essential hypertension: Blood pressure acceptable.  Continue to monitor.  AKI improving with good urine output.  I will sign off at this time, call back with question.  Discussed with primary team.  Subjective: Seen and examined at bedside.  She was calm.  No new event.    Objective Vital signs in last 24 hours: Vitals:   02/20/18 1342 02/20/18 1943 02/20/18 2100 02/21/18 0500  BP: (!) 165/85  (!) 144/66   Pulse: 77  76   Resp: 18  20   Temp: 97.8 F (36.6 C)  98.9 F (37.2 C)   TempSrc: Oral  Oral   SpO2: 92% 96% 93%   Weight:    64.5 kg  Height:       Weight change: -2.4 kg  Intake/Output Summary (Last 24 hours) at 02/21/2018 1035 Last data filed at 02/21/2018 0700 Gross per 24 hour  Intake 840 ml  Output 2390 ml  Net -1550 ml       Labs: Basic Metabolic Panel: Recent Labs  Lab 02/19/18 1418 02/20/18 0600 02/21/18 0431  NA 141 143 143  K 4.0 3.7 3.8  CL 108 110 108  CO2 25 27 25   GLUCOSE 141* 85 98  BUN 44* 41* 34*   CREATININE 3.29* 3.06* 2.67*  CALCIUM 7.9* 8.4* 8.4*  PHOS  --  2.4* 2.5   Liver Function Tests: Recent Labs  Lab 02/18/18 0546 02/19/18 1418 02/20/18 0600 02/21/18 0431  AST 82* 95* 84*  --   ALT 42 41 39  --   ALKPHOS 64 79 81  --   BILITOT 0.8 0.6 0.6  --   PROT 6.5 6.8 6.8  --   ALBUMIN 2.5* 2.5* 2.6*  2.6* 2.7*   Recent Labs  Lab 02/15/18 2115  LIPASE 18   Recent Labs  Lab 02/19/18 1639 02/20/18 0600 02/21/18 0431  AMMONIA 42* 38* 51*   CBC: Recent Labs  Lab 02/15/18 2115 02/16/18 0516 02/17/18 1631 02/18/18 0546 02/19/18 1418 02/20/18 0600 02/21/18 0431  WBC 18.8* 17.4* 14.4* 11.8* 11.2* 12.4* 10.6*  NEUTROABS 14.6* 11.5*  --   --   --   --   --   HGB 9.0* 8.8* 8.5* 8.2* 8.9* 9.2* 10.2*  HCT 28.7* 28.5*  27.7* 27.1* 26.0* 29.3* 30.4* 34.0*  MCV 88.6 89.1 89.1 87.2 89.9 91.3 91.6  PLT 225 222 213 233 212 245 265   Cardiac Enzymes: No results for input(s): CKTOTAL, CKMB, CKMBINDEX, TROPONINI in the last 168 hours. CBG: No results for input(s): GLUCAP in the last 168 hours.  Iron Studies: No results for input(s): IRON, TIBC, TRANSFERRIN,  FERRITIN in the last 72 hours. Studies/Results: No results found.  Medications: Infusions:   Scheduled Medications: . amoxicillin-clavulanate  1 tablet Oral BID  . aspirin  325 mg Oral Daily  . atorvastatin  40 mg Oral q1800  . carvedilol  6.25 mg Oral BID WC  . clopidogrel  75 mg Oral Q breakfast  . feeding supplement (ENSURE ENLIVE)  237 mL Per Tube QID  . heparin  5,000 Units Subcutaneous Q8H  . lactulose  20 g Oral BID  . nicotine  21 mg Transdermal Daily  . QUEtiapine  200 mg Oral QHS  . rifaximin  550 mg Oral BID    have reviewed scheduled and prn medications.  Physical Exam: General:NAD, comfortable Heart:RRR, s1s2 nl Lungs:clear b/l, no crackle Abdomen:soft, Non-tender, non-distended Extremities:No edema  Matthews Franks Prasad Anayi Bricco 02/21/2018,10:35 AM  LOS: 4 days

## 2018-02-21 NOTE — Discharge Summary (Signed)
Physician Discharge Summary  Cassandra Jordan:[id number] DOB: 02-13-1950 DOA: 02/15/2018  PCP: Abran Richard, MD  Admit date: 02/15/2018 Discharge date: 02/21/2018  Admitted From: SNF Disposition:  SNF  Recommendations for Outpatient Follow-up:  1. Follow up with PCP in 1-2 weeks 2. Please obtain BMP/CBC in one week     Discharge Condition: Stable CODE STATUS:DNR Diet recommendation: Heart Healthy    Brief/Interim Summary: 68 year old female with a history of chronic hepatitis C,liver cirrhosis, polysubstance abuse including cocaine and alcohol,essential hypertension, renal cell carcinoma status post right nephrectomy, hyperlipidemia, diabetes mellitus type 2, recent admission for ischemic stroke (12/23/17-01/10/2018, and right carotid stenosisand PSVTpresented with confusion and agitation from her skilled nursing facility.Per report of SNF personnel, the patient has some baseline confusion, but was more disorientedon the day of admissionand becamemoreagitated on the evening of admission. She was given a dose of Ativan at the nursing facility and has been more calm since then, but remains more confused than is typical for her. In the ED, the patient was afebrile hemodynamically stable. She was noted to have serum creatinine up to 3.47. WBC was 18.8. UDS was negative. Urinalysis showed 21-50 WBC. Urine culture was not performed. The patient was mildly hypotensive with a blood pressure 85/67 but improved with a fluid bolus. The patient was admitted for altered mental status and acute on chronic renal failure.  Discharge Diagnoses:  Decompensated liver cirrhosis/hepatic encephalopathy -Patient was started on lactulose -02/18/2018 evening--patient pulled out NG -overall more alert today and conversant, although has sundowning episodes -Patient intermittently refusing lactulose -Started rifaximin -continue lactulose bid -A.m. ammonia level 33>>> 42>>>38>>>36 -Optimize  electrolytes -CT brain negative -continue seroquel at bedtime  Acute on chronic renal failure--CKD stage III -Baseline creatinine 1.3-1.6 -Serum creatinine peaked at 4.24 -Appreciate nephrology follow-up -Likely due to hemodynamic changes and volume depletion -Has contributed to the patient's encephalopathy -Patient is status post right nephrectomy for renal cell carcinoma -serum creatinine 2.67 at time of d/c  Aspiration pneumonitis -Started Augmentin--plan 5 more days to complete one week  Chronic hepatitis C -Outpatient follow-up  Recent ischemic CVA/right carotid stenosis -Patient was originally scheduled for right carotid endarterectomy 02/21/2018 which has been rescheduled -Continue aspirin and Plavix -Restart statin  Chronic systolic and diastolic CHF -37/06/2829 echo EF 45-50%, grade 2 DD, HK mid apical anterior lateral, PASP 43, mild MR, trivial TR -Appears clinically euvolemic -Daily weights -Restart carvedilol  Essential hypertension -Restart antihypertensive medications when patient is safe to takepo -Restart carvedilol and amlodipine  Diabetes mellitus type 2 -12/23/17 hemoglobin A1c 6.7 -Discontinue NovoLog sliding scale -not on meds as outpt    Discharge Instructions   Allergies as of 02/21/2018      Reactions   Sulfa Antibiotics Itching, Other (See Comments)   burning      Medication List    STOP taking these medications   cloNIDine 0.1 mg/24hr patch Commonly known as:  CATAPRES - Dosed in mg/24 hr     TAKE these medications   acetaminophen 325 MG tablet Commonly known as:  TYLENOL Take 325 mg by mouth 2 (two) times daily as needed (pain.).   amLODipine 10 MG tablet Commonly known as:  NORVASC Take 1 tablet (10 mg total) by mouth daily.   amoxicillin-clavulanate 500-125 MG tablet Commonly known as:  AUGMENTIN Take 1 tablet (500 mg total) by mouth 2 (two) times daily.   aspirin 325 MG tablet Take 1 tablet (325 mg total) by  mouth daily.   atorvastatin 40 MG tablet Commonly known as:  LIPITOR Take 40 mg by mouth at bedtime.   carvedilol 6.25 MG tablet Commonly known as:  COREG Take 6.25 mg by mouth 2 (two) times daily.   clopidogrel 75 MG tablet Commonly known as:  PLAVIX Take 1 tablet (75 mg total) by mouth daily with breakfast.   famotidine 20 MG tablet Commonly known as:  PEPCID Take 20 mg by mouth daily.   fenofibrate 145 MG tablet Commonly known as:  TRICOR Take 145 mg by mouth daily.   haloperidol 2 MG tablet Commonly known as:  HALDOL Take 2 mg by mouth 3 (three) times daily.   lactulose 10 GM/15ML solution Commonly known as:  CHRONULAC Take 30 mLs (20 g total) by mouth 2 (two) times daily.   LORazepam 2 MG/ML injection Commonly known as:  ATIVAN Inject 1 mL (2 mg total) into the vein once for 1 dose. Inject 1 mg intramuscularly one time for behaviors until 02/15/18   magnesium hydroxide 400 MG/5ML suspension Commonly known as:  MILK OF MAGNESIA Take 30 mLs by mouth daily as needed for mild constipation.   QUEtiapine 400 MG tablet Commonly known as:  SEROQUEL Take 400 mg by mouth at bedtime. Give 400 mg by mouth at bedtime for agitation and anxiety What changed:  Another medication with the same name was removed. Continue taking this medication, and follow the directions you see here.   rifaximin 550 MG Tabs tablet Commonly known as:  XIFAXAN Take 1 tablet (550 mg total) by mouth 2 (two) times daily.      Contact information for after-discharge care    [facility] .   Service:  Skilled Nursing Contact information: [address] [place] [phone]             Allergies  Allergen Reactions  . Sulfa Antibiotics Itching and Other (See Comments)    burning    Consultations:  renal   Procedures/Studies: Ct Head Wo Contrast  Result Date: 02/16/2018 CLINICAL DATA:  Encephalopathy.  Increased  confusion. EXAM: CT HEAD WITHOUT CONTRAST TECHNIQUE: Contiguous axial images were obtained from the base of the skull through the vertex without intravenous contrast. COMPARISON:  Head CT 01/01/2018, brain MRI 12/23/2017 FINDINGS: Despite restraints and technologist directions and repeat apposition, there is moderate motion artifact, patient had difficulty tolerating the exam. Brain: Unchanged atrophy and chronic small vessel ischemia. No evidence of hydrocephalus. No acute intracranial hemorrhage allowing for motion. Left cerebellar infarct evaluation is limited by motion, no evidence of significant progression. Periventricular right frontal lobe infarcts are not as well seen currently. No gross evidence of acute ischemia, detailed evaluation limited. No midline shift or mass effect. Vascular: No obvious hyperdense vessel. Skull: No acute findings. Sinuses/Orbits: Grossly negative. Other: None. IMPRESSION: 1. Significant motion degradation. 2. No gross acute intracranial abnormality. Left cerebellar and right frontal infarcts on prior exam are partially obscured by motion, no evidence of significant progression. Generalized atrophy and chronic small vessel ischemia again seen. Electronically Signed   By: Keith Rake M.D.   On: 02/16/2018 03:31   US Abdomen Complete  Result Date: 02/16/2018 CLINICAL DATA:  Acute on chronic renal failure. Elevated liver function tests. EXAM: ABDOMEN ULTRASOUND COMPLETE COMPARISON:  CT abdomen 11/18/2016 FINDINGS: The patient was combative and the patient actively resisted ultrasound. As result, a variety of areas normal included on abdominal ultrasound could not be obtained. Gallbladder: The gallbladder was not identified sonographically. It is possible that an existing gallbladder could  be obscured by the heavy calcifications along the gallbladder wall shown on the prior CT from 2018. Common bile duct: Diameter: Could not be obtained. Liver: Nodular liver contour compatible  with cirrhosis. No discrete focal mass. Portal vein is patent on color Doppler imaging with normal direction of blood flow towards the liver. IVC: Could not be obtained. Pancreas: Could not be obtained. Spleen: Could not be obtained. Right Kidney: Could not be obtained. Left Kidney: Length: Could not be obtained. Abdominal aorta: Could not be obtained. Other findings: None. IMPRESSION: 1. Nodular hepatic contour compatible with cirrhosis. No discrete focal mass is identified. Expected hepatopetal portal venous flow. 2. Gallbladder not well seen. It is possible that existing gallbladder could be obscured by the heavy calcifications shown on the prior CT from 2018. 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 3. The patient was combative and actively resisted sonography. As result, the following areas could not be observed: Common bile duct, IVC, pancreas, spleen, kidneys, and abdominal aorta. Electronically Signed   By: Van Clines M.D.   On: 02/16/2018 12:43   US Renal  Result Date: 02/18/2018 CLINICAL DATA:  68 year old female with acute kidney insufficiency with chronic kidney disease stage 3. 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 Subsequent encounter. EXAM: RENAL / URINARY TRACT ULTRASOUND COMPLETE COMPARISON:  02/16/2018 abdominal sonogram.  11/18/2016 CT. FINDINGS: Right Kidney: Post right nephrectomy. Left Kidney: Renal measurements: 11.9 x 5.8 x 5.9 cm = volume: 212.1 mL. Evaluation limited by patient's habitus, bowel gas and uncooperative state. No obvious mass or hydronephrosis. Bladder: Not imaged as it may be contracted with overlying bowel gas. IMPRESSION: 1. Post right nephrectomy. 2. Evaluation of the left kidney limited by patient's habitus, bowel gas and uncooperative state. No left-sided hydronephrosis or obvious mass noted. 3. Bladder not imaged. This may be related to contraction in addition to overlying bowel gas.  Electronically Signed   By: Genia Del M.D.   On: 02/18/2018 10:49   Dg Chest Port 1 View  Result Date: 02/17/2018 CLINICAL DATA:  68 y/o  F; NG tube. EXAM: PORTABLE CHEST 1 VIEW COMPARISON:  02/15/2018 chest radiograph FINDINGS: Normal cardiac silhouette. Enteric tube is coiling in the lower esophagus and tip appears to be projecting over the right mainstem bronchus in the right lower lung. Clear lungs. No pleural effusion or pneumothorax. No acute osseous abnormality is evident. IMPRESSION: Enteric tube tip projects over the right lower lung in the airways, repositioning recommended. These results will be called to the ordering clinician or representative by the Radiologist Assistant, and communication documented in the PACS or zVision Dashboard. Electronically Signed   By: Kristine Garbe M.D.   On: 02/17/2018 17:35   Dg Chest Port 1 View  Result Date: 02/15/2018 CLINICAL DATA:  Disorientation. EXAM: PORTABLE CHEST 1 VIEW COMPARISON:  12/28/2017 FINDINGS: Calcific atherosclerotic disease of the aorta. There is no evidence of focal airspace consolidation, pleural effusion or pneumothorax. Low lung volumes. Osseous structures are without acute abnormality. Soft tissues are grossly normal. IMPRESSION: No active disease. Electronically Signed   By: Fidela Salisbury M.D.   On: 02/15/2018 23:17   Dg Chest Port 1v Same Day  Result Date: 02/17/2018 CLINICAL DATA:  Nasogastric tube placement. EXAM: PORTABLE CHEST 1 VIEW COMPARISON:  Chest x-ray from same day at 1653. FINDINGS: Nasogastric tube seen coiled within the stomach, with the tip below the field of view. Stable cardiomediastinal silhouette. Normal pulmonary vascularity. No focal consolidation, pleural effusion, or  pneumothorax. No acute osseous abnormality. IMPRESSION: Enteric tube coiled within the stomach with the tip below the field of view. Electronically Signed   By: Titus Dubin M.D.   On: 02/17/2018 18:35          Discharge Exam: Vitals:   02/20/18 1943 02/20/18 2100  BP:  (!) 144/66  Pulse:  76  Resp:  20  Temp:  98.9 F (37.2 C)  SpO2: 96% 93%   Vitals:   02/20/18 1342 02/20/18 1943 02/20/18 2100 02/21/18 0500  BP: (!) 165/85  (!) 144/66   Pulse: 77  76   Resp: 18  20   Temp: 97.8 F (36.6 C)  98.9 F (37.2 C)   TempSrc: Oral  Oral   SpO2: 92% 96% 93%   Weight:    64.5 kg  Height:        General: Pt is alert, awake, not in acute distress Cardiovascular: RRR, S1/S2 +, no rubs, no gallops Respiratory: bibasilar rales, no wheeze Abdominal: Soft, NT, ND, bowel sounds + Extremities: no edema, no cyanosis   The results of significant diagnostics from this hospitalization (including imaging, microbiology, ancillary and laboratory) are listed below for reference.    Significant Diagnostic Studies: Ct Head Wo Contrast  Result Date: 02/16/2018 CLINICAL DATA:  Encephalopathy.  Increased confusion. EXAM: CT HEAD WITHOUT CONTRAST TECHNIQUE: Contiguous axial images were obtained from the base of the skull through the vertex without intravenous contrast. COMPARISON:  Head CT 01/01/2018, brain MRI 12/23/2017 FINDINGS: Despite restraints and technologist directions and repeat apposition, there is moderate motion artifact, patient had difficulty tolerating the exam. Brain: Unchanged atrophy and chronic small vessel ischemia. No evidence of hydrocephalus. No acute intracranial hemorrhage allowing for motion. Left cerebellar infarct evaluation is limited by motion, no evidence of significant progression. Periventricular right frontal lobe infarcts are not as well seen currently. No gross evidence of acute ischemia, detailed evaluation limited. No midline shift or mass effect. Vascular: No obvious hyperdense vessel. Skull: No acute findings. Sinuses/Orbits: Grossly negative. Other: None. IMPRESSION: 1. Significant motion degradation. 2. No gross acute intracranial abnormality. Left cerebellar  and right frontal infarcts on prior exam are partially obscured by motion, no evidence of significant progression. Generalized atrophy and chronic small vessel ischemia again seen. Electronically Signed   By: Keith Rake M.D.   On: 02/16/2018 03:31   US Abdomen Complete  Result Date: 02/16/2018 CLINICAL DATA:  Acute on chronic renal failure. Elevated liver function tests. EXAM: ABDOMEN ULTRASOUND COMPLETE COMPARISON:  CT abdomen 11/18/2016 FINDINGS: The patient was combative and the patient actively resisted ultrasound. As result, a variety of areas normal included on abdominal ultrasound could not be obtained. Gallbladder: The gallbladder was not identified sonographically. It is possible that an existing gallbladder could be obscured by the heavy calcifications along the gallbladder wall shown on the prior CT from 2018. Common bile duct: Diameter: Could not be obtained. Liver: Nodular liver contour compatible with cirrhosis. No discrete focal mass. Portal vein is patent on color Doppler imaging with normal direction of blood flow towards the liver. IVC: Could not be obtained. Pancreas: Could not be obtained. Spleen: Could not be obtained. Right Kidney: Could not be obtained. Left Kidney: Length: Could not be obtained. Abdominal aorta: Could not be obtained. Other findings: None. IMPRESSION: 1. Nodular hepatic contour compatible with cirrhosis. No discrete focal mass is identified. Expected hepatopetal portal venous flow. 2. Gallbladder not well seen. It is possible that existing gallbladder could be obscured by the heavy calcifications  shown on the prior CT from 2018. 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 3. The patient was combative and actively resisted sonography. As result, the following areas could not be observed: Common bile duct, IVC, pancreas, spleen, kidneys, and abdominal aorta. Electronically Signed   By: Van Clines M.D.   On:  02/16/2018 12:43   US Renal  Result Date: 02/18/2018 CLINICAL DATA:  68 year old female with acute kidney insufficiency with chronic kidney disease stage 3. 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 Subsequent encounter. EXAM: RENAL / URINARY TRACT ULTRASOUND COMPLETE COMPARISON:  02/16/2018 abdominal sonogram.  11/18/2016 CT. FINDINGS: Right Kidney: Post right nephrectomy. Left Kidney: Renal measurements: 11.9 x 5.8 x 5.9 cm = volume: 212.1 mL. Evaluation limited by patient's habitus, bowel gas and uncooperative state. No obvious mass or hydronephrosis. Bladder: Not imaged as it may be contracted with overlying bowel gas. IMPRESSION: 1. Post right nephrectomy. 2. Evaluation of the left kidney limited by patient's habitus, bowel gas and uncooperative state. No left-sided hydronephrosis or obvious mass noted. 3. Bladder not imaged. This may be related to contraction in addition to overlying bowel gas. Electronically Signed   By: Genia Del M.D.   On: 02/18/2018 10:49   Dg Chest Port 1 View  Result Date: 02/17/2018 CLINICAL DATA:  68 y/o  F; NG tube. EXAM: PORTABLE CHEST 1 VIEW COMPARISON:  02/15/2018 chest radiograph FINDINGS: Normal cardiac silhouette. Enteric tube is coiling in the lower esophagus and tip appears to be projecting over the right mainstem bronchus in the right lower lung. Clear lungs. No pleural effusion or pneumothorax. No acute osseous abnormality is evident. IMPRESSION: Enteric tube tip projects over the right lower lung in the airways, repositioning recommended. These results will be called to the ordering clinician or representative by the Radiologist Assistant, and communication documented in the PACS or zVision Dashboard. Electronically Signed   By: Kristine Garbe M.D.   On: 02/17/2018 17:35   Dg Chest Port 1 View  Result Date: 02/15/2018 CLINICAL DATA:  Disorientation. EXAM: PORTABLE CHEST 1 VIEW COMPARISON:  12/28/2017 FINDINGS: Calcific atherosclerotic  disease of the aorta. There is no evidence of focal airspace consolidation, pleural effusion or pneumothorax. Low lung volumes. Osseous structures are without acute abnormality. Soft tissues are grossly normal. IMPRESSION: No active disease. Electronically Signed   By: Fidela Salisbury M.D.   On: 02/15/2018 23:17   Dg Chest Port 1v Same Day  Result Date: 02/17/2018 CLINICAL DATA:  Nasogastric tube placement. EXAM: PORTABLE CHEST 1 VIEW COMPARISON:  Chest x-ray from same day at 1653. FINDINGS: Nasogastric tube seen coiled within the stomach, with the tip below the field of view. Stable cardiomediastinal silhouette. Normal pulmonary vascularity. No focal consolidation, pleural effusion, or pneumothorax. No acute osseous abnormality. IMPRESSION: Enteric tube coiled within the stomach with the tip below the field of view. Electronically Signed   By: Titus Dubin M.D.   On: 02/17/2018 18:35     Microbiology: Recent Results (from the past 240 hour(s))  Urine culture     Status: None   Collection Time: 02/15/18 10:00 PM  Result Value Ref Range Status   Specimen Description   Final    URINE, CATHETERIZED Performed at [facility], [address], [place], [place] [address]    Special Requests   Final    NONE Performed at [facility], [address] [place],  [address]    Culture   Final    NO GROWTH Performed  at [facility], [place] [address], [place], [place] [address]    Report Status 02/17/2018 FINAL  Final  Culture, blood (Routine X 2) w Reflex to ID Panel     Status: None   Collection Time: 02/16/18  6:21 PM  Result Value Ref Range Status   Specimen Description RIGHT ANTECUBITAL  Final   Special Requests   Final    BOTTLES DRAWN AEROBIC AND ANAEROBIC Blood Culture adequate volume   Culture   Final    NO GROWTH 5 DAYS Performed at [facility], [address], [place], [place] [address]    Report Status 02/21/2018 FINAL  Final  Culture, Urine     Status: Abnormal    Collection Time: 02/19/18  4:15 PM  Result Value Ref Range Status   Specimen Description   Final    URINE, CLEAN CATCH Performed at [facility], [address] [place], [place] [address]    Special Requests   Final    NONE Performed at [facility], [address] [place], [place] [address]    Culture (A)  Final    <10,000 COLONIES/mL INSIGNIFICANT GROWTH Performed at [facility], [place] [address] [place], [place] [address]    Report Status 02/20/2018 FINAL  Final     Labs: Basic Metabolic Panel: Recent Labs  Lab 02/16/18 0516 02/17/18 1631 02/18/18 0546 02/19/18 1418 02/20/18 0600 02/21/18 0431  NA 134* 140 140 141 143 143  K 4.1 4.3 3.5 4.0 3.7 3.8  CL 100 109 107 108 110 108  CO2 25 16* 23 25 27 25   GLUCOSE 92 54* 101* 141* 85 98  BUN 53* 64* 61* 44* 41* 34*  CREATININE 3.83* 4.24* 3.99* 3.29* 3.06* 2.67*  CALCIUM 8.1* 7.9* 7.9* 7.9* 8.4* 8.4*  MG 2.4  --   --   --   --   --   PHOS  --   --   --   --  2.4* 2.5   Liver Function Tests: Recent Labs  Lab 02/16/18 0516 02/17/18 1631 02/18/18 0546 02/19/18 1418 02/20/18 0600 02/21/18 0431  AST 135* 87* 82* 95* 84*  --   ALT 62* 45* 42 41 39  --   ALKPHOS 54 58 64 79 81  --   BILITOT 0.6 1.3* 0.8 0.6 0.6  --   PROT 6.5 6.5 6.5 6.8 6.8  --   ALBUMIN 2.8* 2.5* 2.5* 2.5* 2.6*  2.6* 2.7*   Recent Labs  Lab 02/15/18 2115  LIPASE 18   Recent Labs  Lab 02/18/18 0910 02/19/18 1639 02/20/18 0600 02/21/18 0431 02/21/18 1004  AMMONIA 33 42* 38* 51* 36*   CBC: Recent Labs  Lab 02/15/18 2115 02/16/18 0516 02/17/18 1631 02/18/18 0546 02/19/18 1418 02/20/18 0600 02/21/18 0431  WBC 18.8* 17.4* 14.4* 11.8* 11.2* 12.4* 10.6*  NEUTROABS 14.6* 11.5*  --   --   --   --   --   HGB 9.0* 8.8* 8.5* 8.2* 8.9* 9.2* 10.2*  HCT 28.7* 28.5*  27.7* 27.1* 26.0* 29.3* 30.4* 34.0*  MCV 88.6 89.1 89.1 87.2 89.9 91.3 91.6  PLT 225 222 213 233 212 245 265   Cardiac Enzymes: No results for input(s): CKTOTAL, CKMB,  CKMBINDEX, TROPONINI in the last 168 hours. BNP: Invalid input(s): POCBNP CBG: No results for input(s): GLUCAP in the last 168 hours.  Time coordinating discharge:  36 minutes  Signed:  Orson Eva, DO [facility] Pager: [phone] 02/21/2018, 1:31 PM

## 2018-02-21 NOTE — Progress Notes (Signed)
PT Cancellation Note  Patient Details Name: Cassandra Jordan MRN: [id number] DOB: June 23, 1950   Cancelled Treatment:    Reason Eval/Treat Not Completed: Other (comment). Therapist checked on patient 3x throughout the day, but patient with repeated diarrhea, being cleaned up by nurse techs, and refuses therapy due to frequent/uncontrollable bowel movements.    3:16 PM, 02/21/18 Talbot Grumbling, DPT Physical Therapist with [facility] [phone] office

## 2018-03-11 ENCOUNTER — Other Ambulatory Visit: Payer: Self-pay

## 2018-03-11 ENCOUNTER — Telehealth: Payer: Self-pay

## 2018-03-11 NOTE — Telephone Encounter (Signed)
Spoke to [name] (pt's daughter) to reschedule pt's surgery to 3/13. Also, confirmed new date/time with April at [facility].  Preop instructions provided to both April and Tina (NPO after midhight, 5 day hold on Plavix before surgery). Both verbalized understanding and had no further questions at this time. Letter with instructions also faxed to April at [facility].

## 2018-03-18 ENCOUNTER — Telehealth: Payer: Self-pay | Admitting: Vascular Surgery

## 2018-03-18 NOTE — Telephone Encounter (Signed)
I called patient and her daughter and left voice message.  We need to precert her surgery on 4/58/0998 and need current insurance.    Thurston Hole., LPN

## 2018-04-03 ENCOUNTER — Encounter (HOSPITAL_COMMUNITY): Payer: Self-pay | Admitting: *Deleted

## 2018-04-03 ENCOUNTER — Other Ambulatory Visit: Payer: Self-pay

## 2018-04-03 NOTE — Pre-Procedure Instructions (Signed)
   SYLVA OVERLEY  04/03/2018    Ms. Marcom's procedure is scheduled on Friday, 04/04/18 at 9:45 AM.   Report to [facility] Entrance "A" Admitting Office at 7:15 AM.   Call this number if you have problems the morning of surgery: [phone]    Remember:  Patient is not to eat or drink after midnight tonight.  Take these medicines the morning of surgery with A SIP OF WATER: Aspirin, Amlodipine, Carvedilol, Pepcid, Haldol  Please send update MAR with patient    Do not wear jewelry, make-up or nail polish.  Do not wear lotions, powders, perfumes or deodorant.  Do not shave 48 hours prior to surgery.    Do not bring valuables to the hospital.  [facility] is not responsible for any belongings or valuables.  Contacts, dentures or bridgework may not be worn into surgery.  Leave your suitcase in the car.  After surgery it may be brought to your room.  For patients admitted to the hospital, discharge time will be determined by your treatment team.  Any questions today, please call Lilia Pro, RN at [phone].

## 2018-04-03 NOTE — Progress Notes (Signed)
Called pt's daughter, Lemmie Evens and left time of arrival information on her voicemail

## 2018-04-03 NOTE — Progress Notes (Signed)
Pt is a resident at [facility], [place]. Spoke with Collie Siad, RN for pre-op call. Pt has hx of PSVT and sees Dr. Domenic Polite. Pt is on Plavix, was instructed to hold for 5 days. Last dose was 03/31/18 per Collie Siad, RN. Last office visit was 12/11/17. Pt is a type 2 diabetic, on no meds and Collie Siad states they do no check her blood sugar. Pt's last A1C was 6.7 on 12/23/17.  Collie Siad states pt is alert, oriented and able to speak for herself. Collie Siad states that the nursing facility is checking everyone's temperature daily, pt has not had a fever.  Faxed pre-op instructions to Collie Siad, RN at [phone]. Requested that she send updated MAR in AM.

## 2018-04-04 ENCOUNTER — Encounter (HOSPITAL_COMMUNITY): Payer: Self-pay | Admitting: Anesthesiology

## 2018-04-04 ENCOUNTER — Encounter (HOSPITAL_COMMUNITY)
Admission: RE | Disposition: A | Discharge status: Skilled Nursing Facility | Payer: Self-pay | Source: Skilled Nursing Facility | Attending: Vascular Surgery

## 2018-04-04 ENCOUNTER — Inpatient Hospital Stay (HOSPITAL_COMMUNITY): Payer: Medicare Other | Admitting: Anesthesiology

## 2018-04-04 ENCOUNTER — Other Ambulatory Visit: Payer: Self-pay

## 2018-04-04 ENCOUNTER — Inpatient Hospital Stay (HOSPITAL_COMMUNITY)
Admission: RE | Admit: 2018-04-04 | Discharge: 2018-04-06 | DRG: 038 | Disposition: A | Discharge status: Skilled Nursing Facility | Payer: Medicare Other | Source: Skilled Nursing Facility | Attending: Vascular Surgery | Admitting: Vascular Surgery

## 2018-04-04 DIAGNOSIS — I5042 Chronic combined systolic (congestive) and diastolic (congestive) heart failure: Secondary | ICD-10-CM | POA: Diagnosis present

## 2018-04-04 DIAGNOSIS — F1011 Alcohol abuse, in remission: Secondary | ICD-10-CM | POA: Diagnosis present

## 2018-04-04 DIAGNOSIS — Z833 Family history of diabetes mellitus: Secondary | ICD-10-CM

## 2018-04-04 DIAGNOSIS — Z8673 Personal history of transient ischemic attack (TIA), and cerebral infarction without residual deficits: Secondary | ICD-10-CM

## 2018-04-04 DIAGNOSIS — N183 Chronic kidney disease, stage 3 (moderate): Secondary | ICD-10-CM | POA: Diagnosis present

## 2018-04-04 DIAGNOSIS — F209 Schizophrenia, unspecified: Secondary | ICD-10-CM | POA: Diagnosis present

## 2018-04-04 DIAGNOSIS — Z85528 Personal history of other malignant neoplasm of kidney: Secondary | ICD-10-CM | POA: Diagnosis not present

## 2018-04-04 DIAGNOSIS — Z7982 Long term (current) use of aspirin: Secondary | ICD-10-CM | POA: Diagnosis not present

## 2018-04-04 DIAGNOSIS — G4730 Sleep apnea, unspecified: Secondary | ICD-10-CM | POA: Diagnosis present

## 2018-04-04 DIAGNOSIS — I130 Hypertensive heart and chronic kidney disease with heart failure and stage 1 through stage 4 chronic kidney disease, or unspecified chronic kidney disease: Secondary | ICD-10-CM | POA: Diagnosis present

## 2018-04-04 DIAGNOSIS — Z7902 Long term (current) use of antithrombotics/antiplatelets: Secondary | ICD-10-CM

## 2018-04-04 DIAGNOSIS — B182 Chronic viral hepatitis C: Secondary | ICD-10-CM | POA: Diagnosis present

## 2018-04-04 DIAGNOSIS — Z79899 Other long term (current) drug therapy: Secondary | ICD-10-CM | POA: Diagnosis not present

## 2018-04-04 DIAGNOSIS — E785 Hyperlipidemia, unspecified: Secondary | ICD-10-CM | POA: Diagnosis present

## 2018-04-04 DIAGNOSIS — I6521 Occlusion and stenosis of right carotid artery: Principal | ICD-10-CM | POA: Diagnosis present

## 2018-04-04 DIAGNOSIS — Z8249 Family history of ischemic heart disease and other diseases of the circulatory system: Secondary | ICD-10-CM | POA: Diagnosis not present

## 2018-04-04 DIAGNOSIS — F1411 Cocaine abuse, in remission: Secondary | ICD-10-CM | POA: Diagnosis present

## 2018-04-04 DIAGNOSIS — Z800 Family history of malignant neoplasm of digestive organs: Secondary | ICD-10-CM | POA: Diagnosis not present

## 2018-04-04 DIAGNOSIS — F17210 Nicotine dependence, cigarettes, uncomplicated: Secondary | ICD-10-CM | POA: Diagnosis present

## 2018-04-04 DIAGNOSIS — E1122 Type 2 diabetes mellitus with diabetic chronic kidney disease: Secondary | ICD-10-CM | POA: Diagnosis present

## 2018-04-04 DIAGNOSIS — I6529 Occlusion and stenosis of unspecified carotid artery: Secondary | ICD-10-CM | POA: Diagnosis present

## 2018-04-04 DIAGNOSIS — K7460 Unspecified cirrhosis of liver: Secondary | ICD-10-CM | POA: Diagnosis present

## 2018-04-04 DIAGNOSIS — Z882 Allergy status to sulfonamides status: Secondary | ICD-10-CM

## 2018-04-04 DIAGNOSIS — D62 Acute posthemorrhagic anemia: Secondary | ICD-10-CM | POA: Diagnosis not present

## 2018-04-04 HISTORY — PX: PATCH ANGIOPLASTY: SHX6230

## 2018-04-04 HISTORY — DX: Occlusion and stenosis of unspecified carotid artery: I65.29

## 2018-04-04 HISTORY — PX: ENDARTERECTOMY: SHX5162

## 2018-04-04 HISTORY — PX: CAROTID ENDARTERECTOMY: SUR193

## 2018-04-04 LAB — CBC
HCT: 29.3 % — ABNORMAL LOW (ref 36.0–46.0)
Hemoglobin: 9.1 g/dL — ABNORMAL LOW (ref 12.0–15.0)
MCH: 28.1 pg (ref 26.0–34.0)
MCHC: 31.1 g/dL (ref 30.0–36.0)
MCV: 90.4 fL (ref 80.0–100.0)
Platelets: 378 10*3/uL (ref 150–400)
RBC: 3.24 MIL/uL — AB (ref 3.87–5.11)
RDW: 15.7 % — ABNORMAL HIGH (ref 11.5–15.5)
WBC: 8.5 10*3/uL (ref 4.0–10.5)
nRBC: 0 % (ref 0.0–0.2)

## 2018-04-04 LAB — GLUCOSE, CAPILLARY
GLUCOSE-CAPILLARY: 132 mg/dL — AB (ref 70–99)
Glucose-Capillary: 96 mg/dL (ref 70–99)
Glucose-Capillary: 132 mg/dL — ABNORMAL HIGH (ref 70–99)

## 2018-04-04 LAB — PROTIME-INR
INR: 1.1 (ref 0.8–1.2)
Prothrombin Time: 13.7 seconds (ref 11.4–15.2)

## 2018-04-04 LAB — URINALYSIS, ROUTINE W REFLEX MICROSCOPIC
Bilirubin Urine: NEGATIVE
Glucose, UA: NEGATIVE mg/dL
Ketones, ur: NEGATIVE mg/dL
Nitrite: NEGATIVE
Protein, ur: 100 mg/dL — AB
Specific Gravity, Urine: 1.012 (ref 1.005–1.030)
pH: 8 (ref 5.0–8.0)

## 2018-04-04 LAB — COMPREHENSIVE METABOLIC PANEL
ALT: 50 U/L — ABNORMAL HIGH (ref 0–44)
AST: 103 U/L — AB (ref 15–41)
Albumin: 2.9 g/dL — ABNORMAL LOW (ref 3.5–5.0)
Alkaline Phosphatase: 113 U/L (ref 38–126)
Anion gap: 10 (ref 5–15)
BUN: 21 mg/dL (ref 8–23)
CO2: 21 mmol/L — ABNORMAL LOW (ref 22–32)
CREATININE: 1.64 mg/dL — AB (ref 0.44–1.00)
Calcium: 9 mg/dL (ref 8.9–10.3)
Chloride: 105 mmol/L (ref 98–111)
GFR calc Af Amer: 37 mL/min — ABNORMAL LOW (ref >60)
GFR calc non Af Amer: 32 mL/min — ABNORMAL LOW (ref >60)
Glucose, Bld: 102 mg/dL — ABNORMAL HIGH (ref 70–99)
Potassium: 3.5 mmol/L (ref 3.5–5.1)
Sodium: 136 mmol/L (ref 135–145)
Total Bilirubin: 0.6 mg/dL (ref 0.3–1.2)
Total Protein: 8.1 g/dL (ref 6.5–8.1)

## 2018-04-04 LAB — APTT: aPTT: 35 seconds (ref 24–36)

## 2018-04-04 SURGERY — ENDARTERECTOMY, CAROTID
Anesthesia: General | Site: Neck | Laterality: Right

## 2018-04-04 MED ORDER — SODIUM CHLORIDE 0.9 % IV SOLN
INTRAVENOUS | Status: AC
  Filled 2018-04-04: qty 1.2

## 2018-04-04 MED ORDER — CHLORHEXIDINE GLUCONATE CLOTH 2 % EX PADS: 6.0000 | MEDICATED_PAD | Freq: Once | CUTANEOUS | Status: DC

## 2018-04-04 MED ORDER — CHOLESTYRAMINE 4 G PO PACK
4.0000 g | PACK | Freq: Two times a day (BID) | ORAL | Status: DC
  Administered 2018-04-05 – 2018-04-06 (×3): 4 g via ORAL
  Filled 2018-04-04 (×4): qty 1

## 2018-04-04 MED ORDER — MORPHINE SULFATE (PF) 2 MG/ML IV SOLN: 2.0000 mg | INTRAVENOUS | Status: DC | PRN

## 2018-04-04 MED ORDER — FAMOTIDINE 20 MG PO TABS
20.0000 mg | ORAL_TABLET | Freq: Every day | ORAL | Status: DC
  Administered 2018-04-05 – 2018-04-06 (×2): 20 mg via ORAL
  Filled 2018-04-04 (×2): qty 1

## 2018-04-04 MED ORDER — CEFAZOLIN SODIUM-DEXTROSE 2-4 GM/100ML-% IV SOLN
2.0000 g | Freq: Three times a day (TID) | INTRAVENOUS | Status: AC
  Administered 2018-04-04 – 2018-04-05 (×2): 2 g via INTRAVENOUS
  Filled 2018-04-04 (×2): qty 100

## 2018-04-04 MED ORDER — ASPIRIN 325 MG PO TABS
325.0000 mg | ORAL_TABLET | Freq: Every day | ORAL | Status: DC
  Administered 2018-04-05 – 2018-04-06 (×2): 325 mg via ORAL
  Filled 2018-04-04 (×2): qty 1

## 2018-04-04 MED ORDER — ACETAMINOPHEN 325 MG PO TABS: 325.0000 mg | ORAL_TABLET | ORAL | Status: DC | PRN

## 2018-04-04 MED ORDER — ROCURONIUM BROMIDE 50 MG/5ML IV SOSY
PREFILLED_SYRINGE | INTRAVENOUS | Status: AC
  Filled 2018-04-04: qty 5

## 2018-04-04 MED ORDER — LABETALOL HCL 5 MG/ML IV SOLN
INTRAVENOUS | Status: AC
  Filled 2018-04-04: qty 4

## 2018-04-04 MED ORDER — PROTAMINE SULFATE 10 MG/ML IV SOLN
INTRAVENOUS | Status: AC
  Filled 2018-04-04: qty 5

## 2018-04-04 MED ORDER — ONDANSETRON HCL 4 MG/2ML IJ SOLN
4.0000 mg | Freq: Four times a day (QID) | INTRAMUSCULAR | Status: DC | PRN
  Administered 2018-04-04 – 2018-04-05 (×2): 4 mg via INTRAVENOUS
  Filled 2018-04-04 (×2): qty 2

## 2018-04-04 MED ORDER — PROPOFOL 10 MG/ML IV BOLUS
INTRAVENOUS | Status: AC
  Filled 2018-04-04: qty 20

## 2018-04-04 MED ORDER — MAGNESIUM HYDROXIDE 400 MG/5ML PO SUSP: 30.0000 mL | Freq: Every day | ORAL | Status: DC | PRN

## 2018-04-04 MED ORDER — SUGAMMADEX SODIUM 200 MG/2ML IV SOLN
INTRAVENOUS | Status: DC | PRN
  Administered 2018-04-04: 150 mg via INTRAVENOUS

## 2018-04-04 MED ORDER — LIDOCAINE HCL (PF) 1 % IJ SOLN
INTRAMUSCULAR | Status: AC
  Filled 2018-04-04: qty 30

## 2018-04-04 MED ORDER — FENOFIBRATE 54 MG PO TABS
54.0000 mg | ORAL_TABLET | Freq: Every day | ORAL | Status: DC
  Administered 2018-04-04 – 2018-04-06 (×3): 54 mg via ORAL
  Filled 2018-04-04 (×3): qty 1

## 2018-04-04 MED ORDER — CARVEDILOL 6.25 MG PO TABS
6.2500 mg | ORAL_TABLET | Freq: Two times a day (BID) | ORAL | Status: DC
  Administered 2018-04-04 – 2018-04-06 (×4): 6.25 mg via ORAL
  Filled 2018-04-04 (×5): qty 1

## 2018-04-04 MED ORDER — OXYCODONE-ACETAMINOPHEN 5-325 MG PO TABS
ORAL_TABLET | ORAL | Status: AC
  Filled 2018-04-04: qty 2

## 2018-04-04 MED ORDER — DEXAMETHASONE SODIUM PHOSPHATE 10 MG/ML IJ SOLN
INTRAMUSCULAR | Status: DC | PRN
  Administered 2018-04-04: 4 mg via INTRAVENOUS

## 2018-04-04 MED ORDER — MIDAZOLAM HCL 2 MG/2ML IJ SOLN
INTRAMUSCULAR | Status: AC
  Filled 2018-04-04: qty 2

## 2018-04-04 MED ORDER — PHENOL 1.4 % MT LIQD: 1.0000 | OROMUCOSAL | Status: DC | PRN

## 2018-04-04 MED ORDER — HYDRALAZINE HCL 20 MG/ML IJ SOLN
INTRAMUSCULAR | Status: AC
  Filled 2018-04-04: qty 1

## 2018-04-04 MED ORDER — MAGNESIUM SULFATE 2 GM/50ML IV SOLN: 2.0000 g | Freq: Every day | INTRAVENOUS | Status: DC | PRN

## 2018-04-04 MED ORDER — LIDOCAINE 2% (20 MG/ML) 5 ML SYRINGE
INTRAMUSCULAR | Status: DC | PRN
  Administered 2018-04-04: 80 mg via INTRAVENOUS

## 2018-04-04 MED ORDER — ACETAMINOPHEN 325 MG RE SUPP: 325.0000 mg | RECTAL | Status: DC | PRN

## 2018-04-04 MED ORDER — SODIUM CHLORIDE 0.9 % IV SOLN: INTRAVENOUS | Status: AC

## 2018-04-04 MED ORDER — ALUM & MAG HYDROXIDE-SIMETH 200-200-20 MG/5ML PO SUSP: 15.0000 mL | ORAL | Status: DC | PRN

## 2018-04-04 MED ORDER — PROTAMINE SULFATE 10 MG/ML IV SOLN
INTRAVENOUS | Status: DC | PRN
  Administered 2018-04-04: 10 mg via INTRAVENOUS
  Administered 2018-04-04: 20 mg via INTRAVENOUS
  Administered 2018-04-04 (×2): 10 mg via INTRAVENOUS

## 2018-04-04 MED ORDER — CLOPIDOGREL BISULFATE 75 MG PO TABS
75.0000 mg | ORAL_TABLET | Freq: Every day | ORAL | Status: DC
  Administered 2018-04-05 – 2018-04-06 (×2): 75 mg via ORAL
  Filled 2018-04-04 (×2): qty 1

## 2018-04-04 MED ORDER — SODIUM CHLORIDE 0.9 % IV SOLN
INTRAVENOUS | Status: DC
  Administered 2018-04-04 (×2): via INTRAVENOUS

## 2018-04-04 MED ORDER — HEPARIN SODIUM (PORCINE) 1000 UNIT/ML IJ SOLN
INTRAMUSCULAR | Status: DC | PRN
  Administered 2018-04-04: 6000 [IU] via INTRAVENOUS

## 2018-04-04 MED ORDER — QUETIAPINE FUMARATE 100 MG PO TABS
200.0000 mg | ORAL_TABLET | Freq: Every day | ORAL | Status: DC
  Administered 2018-04-04 – 2018-04-05 (×2): 200 mg via ORAL
  Filled 2018-04-04 (×3): qty 2

## 2018-04-04 MED ORDER — NITROGLYCERIN 0.2 MG/ML ON CALL CATH LAB
INTRAVENOUS | Status: DC | PRN
  Administered 2018-04-04: 20 ug via INTRAVENOUS
  Administered 2018-04-04 (×3): 40 ug via INTRAVENOUS
  Administered 2018-04-04: 20 ug via INTRAVENOUS
  Administered 2018-04-04: 40 ug via INTRAVENOUS
  Administered 2018-04-04: 20 ug via INTRAVENOUS
  Administered 2018-04-04 (×11): 40 ug via INTRAVENOUS
  Administered 2018-04-04: 20 ug via INTRAVENOUS
  Administered 2018-04-04: 40 ug via INTRAVENOUS
  Administered 2018-04-04: 20 ug via INTRAVENOUS
  Administered 2018-04-04: 60 ug via INTRAVENOUS
  Administered 2018-04-04 (×5): 40 ug via INTRAVENOUS

## 2018-04-04 MED ORDER — OXYCODONE-ACETAMINOPHEN 5-325 MG PO TABS
1.0000 | ORAL_TABLET | ORAL | Status: DC | PRN
  Administered 2018-04-04 – 2018-04-06 (×5): 2 via ORAL
  Filled 2018-04-04 (×4): qty 2

## 2018-04-04 MED ORDER — 0.9 % SODIUM CHLORIDE (POUR BTL) OPTIME
TOPICAL | Status: DC | PRN
  Administered 2018-04-04: 2000 mL

## 2018-04-04 MED ORDER — DOCUSATE SODIUM 100 MG PO CAPS
100.0000 mg | ORAL_CAPSULE | Freq: Every day | ORAL | Status: DC
  Administered 2018-04-05 – 2018-04-06 (×2): 100 mg via ORAL
  Filled 2018-04-04 (×2): qty 1

## 2018-04-04 MED ORDER — HYDRALAZINE HCL 20 MG/ML IJ SOLN
5.0000 mg | INTRAMUSCULAR | Status: DC | PRN
  Administered 2018-04-04: 5 mg via INTRAVENOUS

## 2018-04-04 MED ORDER — LACTULOSE 10 GM/15ML PO SOLN
20.0000 g | Freq: Every day | ORAL | Status: DC
  Administered 2018-04-05 – 2018-04-06 (×2): 20 g via ORAL
  Filled 2018-04-04 (×3): qty 30

## 2018-04-04 MED ORDER — ROCURONIUM BROMIDE 10 MG/ML (PF) SYRINGE
PREFILLED_SYRINGE | INTRAVENOUS | Status: DC | PRN
  Administered 2018-04-04: 20 mg via INTRAVENOUS
  Administered 2018-04-04: 10 mg via INTRAVENOUS
  Administered 2018-04-04: 50 mg via INTRAVENOUS

## 2018-04-04 MED ORDER — ATORVASTATIN CALCIUM 40 MG PO TABS
40.0000 mg | ORAL_TABLET | Freq: Every day | ORAL | Status: DC
  Administered 2018-04-04 – 2018-04-05 (×2): 40 mg via ORAL
  Filled 2018-04-04 (×3): qty 1

## 2018-04-04 MED ORDER — DEXAMETHASONE SODIUM PHOSPHATE 10 MG/ML IJ SOLN
INTRAMUSCULAR | Status: AC
  Filled 2018-04-04: qty 1

## 2018-04-04 MED ORDER — SODIUM CHLORIDE 0.9 % IV SOLN: 500.0000 mL | Freq: Once | INTRAVENOUS | Status: DC | PRN

## 2018-04-04 MED ORDER — ONDANSETRON HCL 4 MG/2ML IJ SOLN
INTRAMUSCULAR | Status: AC
  Filled 2018-04-04: qty 2

## 2018-04-04 MED ORDER — ONDANSETRON HCL 4 MG/2ML IJ SOLN
INTRAMUSCULAR | Status: DC | PRN
  Administered 2018-04-04: 4 mg via INTRAVENOUS

## 2018-04-04 MED ORDER — AMLODIPINE BESYLATE 10 MG PO TABS
10.0000 mg | ORAL_TABLET | Freq: Every day | ORAL | Status: DC
  Administered 2018-04-05 – 2018-04-06 (×2): 10 mg via ORAL
  Filled 2018-04-04 (×2): qty 1

## 2018-04-04 MED ORDER — RIFAXIMIN 550 MG PO TABS
550.0000 mg | ORAL_TABLET | Freq: Two times a day (BID) | ORAL | Status: DC
  Administered 2018-04-04 – 2018-04-06 (×4): 550 mg via ORAL
  Filled 2018-04-04 (×6): qty 1

## 2018-04-04 MED ORDER — SODIUM CHLORIDE 0.9 % IV SOLN
0.0125 ug/kg/min | INTRAVENOUS | Status: AC
  Administered 2018-04-04: .1 ug/kg/min via INTRAVENOUS
  Filled 2018-04-04: qty 2000

## 2018-04-04 MED ORDER — HALOPERIDOL 2 MG PO TABS
2.0000 mg | ORAL_TABLET | Freq: Three times a day (TID) | ORAL | Status: DC
  Administered 2018-04-04 – 2018-04-06 (×6): 2 mg via ORAL
  Filled 2018-04-04 (×9): qty 1

## 2018-04-04 MED ORDER — METOPROLOL TARTRATE 5 MG/5ML IV SOLN: 2.0000 mg | INTRAVENOUS | Status: DC | PRN

## 2018-04-04 MED ORDER — LABETALOL HCL 5 MG/ML IV SOLN
INTRAVENOUS | Status: DC | PRN
  Administered 2018-04-04 (×2): 5 mg via INTRAVENOUS
  Administered 2018-04-04: 10 mg via INTRAVENOUS
  Administered 2018-04-04 (×4): 5 mg via INTRAVENOUS

## 2018-04-04 MED ORDER — PHENYLEPHRINE 40 MCG/ML (10ML) SYRINGE FOR IV PUSH (FOR BLOOD PRESSURE SUPPORT)
PREFILLED_SYRINGE | INTRAVENOUS | Status: DC | PRN
  Administered 2018-04-04: 8 ug via INTRAVENOUS
  Administered 2018-04-04 (×2): 40 ug via INTRAVENOUS

## 2018-04-04 MED ORDER — HEPARIN SODIUM (PORCINE) 1000 UNIT/ML IJ SOLN
INTRAMUSCULAR | Status: AC
  Filled 2018-04-04: qty 1

## 2018-04-04 MED ORDER — POTASSIUM CHLORIDE CRYS ER 20 MEQ PO TBCR: 20.0000 meq | EXTENDED_RELEASE_TABLET | Freq: Every day | ORAL | Status: DC | PRN

## 2018-04-04 MED ORDER — GUAIFENESIN-DM 100-10 MG/5ML PO SYRP: 15.0000 mL | ORAL_SOLUTION | ORAL | Status: DC | PRN

## 2018-04-04 MED ORDER — LIDOCAINE 2% (20 MG/ML) 5 ML SYRINGE
INTRAMUSCULAR | Status: AC
  Filled 2018-04-04: qty 5

## 2018-04-04 MED ORDER — PROPOFOL 10 MG/ML IV BOLUS
INTRAVENOUS | Status: DC | PRN
  Administered 2018-04-04: 50 mg via INTRAVENOUS
  Administered 2018-04-04: 100 mg via INTRAVENOUS

## 2018-04-04 MED ORDER — SODIUM CHLORIDE 0.9 % IV SOLN
INTRAVENOUS | Status: DC | PRN
  Administered 2018-04-04: 11:00:00

## 2018-04-04 MED ORDER — FENTANYL CITRATE (PF) 250 MCG/5ML IJ SOLN
INTRAMUSCULAR | Status: AC
  Filled 2018-04-04: qty 5

## 2018-04-04 MED ORDER — SODIUM CHLORIDE 0.9 % IV SOLN
INTRAVENOUS | Status: DC | PRN
  Administered 2018-04-04: 10 ug/min via INTRAVENOUS

## 2018-04-04 MED ORDER — CEFAZOLIN SODIUM-DEXTROSE 2-4 GM/100ML-% IV SOLN
2.0000 g | INTRAVENOUS | Status: AC
  Administered 2018-04-04: 2 g via INTRAVENOUS
  Filled 2018-04-04: qty 100

## 2018-04-04 MED ORDER — FENTANYL CITRATE (PF) 100 MCG/2ML IJ SOLN
INTRAMUSCULAR | Status: DC | PRN
  Administered 2018-04-04: 100 ug via INTRAVENOUS

## 2018-04-04 MED ORDER — LABETALOL HCL 5 MG/ML IV SOLN
10.0000 mg | INTRAVENOUS | Status: AC | PRN
  Administered 2018-04-04 – 2018-04-05 (×4): 10 mg via INTRAVENOUS
  Filled 2018-04-04 (×2): qty 4

## 2018-04-04 SURGICAL SUPPLY — 38 items
CANISTER SUCT 3000ML PPV (MISCELLANEOUS) ×2 IMPLANT
CANNULA VESSEL 3MM 2 BLNT TIP (CANNULA) ×4 IMPLANT
CATH ROBINSON RED A/P 18FR (CATHETERS) ×2 IMPLANT
CLIP LIGATING EXTRA MED SLVR (CLIP) ×2 IMPLANT
CLIP LIGATING EXTRA SM BLUE (MISCELLANEOUS) ×2 IMPLANT
COVER WAND RF STERILE (DRAPES) IMPLANT
CRADLE DONUT ADULT HEAD (MISCELLANEOUS) ×2 IMPLANT
DECANTER SPIKE VIAL GLASS SM (MISCELLANEOUS) IMPLANT
DERMABOND ADVANCED (GAUZE/BANDAGES/DRESSINGS) ×1
DERMABOND ADVANCED .7 DNX12 (GAUZE/BANDAGES/DRESSINGS) ×1 IMPLANT
DRAIN HEMOVAC 1/8 X 5 (WOUND CARE) IMPLANT
ELECT REM PT RETURN 9FT ADLT (ELECTROSURGICAL) ×2
ELECTRODE REM PT RTRN 9FT ADLT (ELECTROSURGICAL) ×1 IMPLANT
EVACUATOR SILICONE 100CC (DRAIN) IMPLANT
GLOVE SS BIOGEL STRL SZ 7.5 (GLOVE) ×1 IMPLANT
GLOVE SUPERSENSE BIOGEL SZ 7.5 (GLOVE) ×1
GOWN STRL REUS W/ TWL LRG LVL3 (GOWN DISPOSABLE) ×3 IMPLANT
GOWN STRL REUS W/TWL LRG LVL3 (GOWN DISPOSABLE) ×3
KIT BASIN OR (CUSTOM PROCEDURE TRAY) ×2 IMPLANT
KIT SHUNT ARGYLE CAROTID ART 6 (VASCULAR PRODUCTS) IMPLANT
KIT TURNOVER KIT B (KITS) ×2 IMPLANT
NEEDLE 22X1 1/2 (OR ONLY) (NEEDLE) IMPLANT
NS IRRIG 1000ML POUR BTL (IV SOLUTION) ×4 IMPLANT
PACK CAROTID (CUSTOM PROCEDURE TRAY) ×2 IMPLANT
PAD ARMBOARD 7.5X6 YLW CONV (MISCELLANEOUS) ×4 IMPLANT
PATCH HEMASHIELD 8X75 (Vascular Products) ×2 IMPLANT
SHUNT CAROTID BYPASS 10 (VASCULAR PRODUCTS) ×2 IMPLANT
SHUNT CAROTID BYPASS 12FRX15.5 (VASCULAR PRODUCTS) IMPLANT
SUT ETHILON 3 0 PS 1 (SUTURE) IMPLANT
SUT PROLENE 6 0 CC (SUTURE) ×4 IMPLANT
SUT SILK 3 0 (SUTURE) ×1
SUT SILK 3-0 18XBRD TIE 12 (SUTURE) ×1 IMPLANT
SUT VIC AB 3-0 SH 27 (SUTURE) ×2
SUT VIC AB 3-0 SH 27X BRD (SUTURE) ×2 IMPLANT
SUT VICRYL 4-0 PS2 18IN ABS (SUTURE) ×2 IMPLANT
SYR CONTROL 10ML LL (SYRINGE) IMPLANT
TOWEL GREEN STERILE (TOWEL DISPOSABLE) ×2 IMPLANT
WATER STERILE IRR 1000ML POUR (IV SOLUTION) ×2 IMPLANT

## 2018-04-04 NOTE — Transfer of Care (Signed)
Immediate Anesthesia Transfer of Care Note  Patient: Cassandra Jordan  Procedure(s) Performed: RIGHT ENDARTERECTOMY CAROTID (Right Neck) PATCH ANGIOPLAST USING HEMASHIELD PLATINUM FINESSE (Right Neck)  Patient Location: PACU  Anesthesia Type:General  Level of Consciousness: awake, oriented and patient cooperative  Airway & Oxygen Therapy: Patient Spontanous Breathing and Patient connected to nasal cannula oxygen  Post-op Assessment: Report given to RN and Post -op Vital signs reviewed and stable  Post vital signs: Reviewed  Last Vitals:  Vitals Value Taken Time  BP    Temp    Pulse 68 04/04/2018 12:27 PM  Resp 17 04/04/2018 12:27 PM  SpO2 98 % 04/04/2018 12:27 PM  Vitals shown include unvalidated device data.  Last Pain:  Vitals:   04/04/18 0814  TempSrc: Oral      Patients Stated Pain Goal: 3 (31/67/42 5525)  Complications: No apparent anesthesia complications

## 2018-04-04 NOTE — Progress Notes (Signed)
Per crna + dr hatchett, use cuff pressures for bp parameters

## 2018-04-04 NOTE — Progress Notes (Signed)
Pt received from PACU RN.  As per report from PACU, pt presents with slight left facial droop.  RN states that Dr. Donnetta Hutching has evaluated and no further orders received.  Will continue to monitor as per neuro assessment orders.

## 2018-04-04 NOTE — Op Note (Signed)
° °  OPERATIVE REPORT  DATE OF SURGERY: 04/04/2018  PATIENT: Cassandra Jordan, 68 y.o. female MRN: [id number]  DOB: 06-20-1950  PRE-OPERATIVE DIAGNOSIS: Right Carotid Stenosis, Symptomatic  POST-OPERATIVE DIAGNOSIS:  Same  PROCEDURE:  Right Carotid Endarterectomy with Dacron Patch Angioplasty  SURGEON:  Curt Jews, M.D.  PHYSICIAN ASSISTANT: Matt Eveland, PA-C  ANESTHESIA:   general  EBL: Less than 200 ml  Total I/O In: 1000 [I.V.:1000] Out: 100 [Blood:100]  BLOOD ADMINISTERED: none  DRAINS: none   SPECIMEN: none  COUNTS CORRECT:  YES  PLAN OF CARE: Admit to inpatient   PATIENT DISPOSITION:  PACU - hemodynamically stable and neurologically intact.  PROCEDURE DETAILS: The patient was taken to the operating room placed in supine position.  General anesthesia was administered.  The neck was prepped and draped in the usual sterile fashion.  An incision was made anterior to the sternocleidomastoid and carried down through the platysma with electrocautery.  The sternocleidomastoid was reflected posteriorly and the carotid sheath was opened.  The facial vein was ligated with 2-0 silk ties and divided.  The common carotid artery was encircled with an umbilical tape and Rummel tourniquet.  The vagus nerve was identified and preserved.  Dissection was continued onto the carotid bifurcation.  The superior thyroid artery was encircled with a 2-0 silk Potts tie.  The external carotid was encircled with a blue vessel loop and the internal carotid was encircled with an umbilical tape and Rummel tourniquet.  The hypoglossal nerve was identified and preserved.  The patient was given systemic heparin and after adequate circulation time, the internal, external and common carotid arteries were occluded with vascular clamps.  The common carotid artery was opened with an 11 blade and extended  longitudinally with Potts scissors.  A 10 shunt was passed up the internal carotid and allowed to backbleed.  It  was then passed down the common carotid where it was secured with Rummel tourniquet.  The endarterectomy was begun on the common carotid artery and the plaque was divided proximally with Potts scissors.  The endarterectomy was continued onto the bifurcation.  The external carotid was endarterectomized with an eversion technique and the internal carotid was endarterectomized in an open fashion.  Remaining atheromatous debris was removed from the endarterectomy plane.  A Finesse Hemashield Dacron patch was brought onto the field and was sewn as a patch angioplasty with a running 6-0 Prolene suture.  Prior to completion of the closure the shunt was removed and the usual flushing maneuvers were undertaken.  The anastomosis was completed and flow was restored first to the external and then the internal carotid artery.  Excellent flow characteristics were noted with hand-held Doppler in the internal and external carotid arteries.  The patient was given 50 mg of protamine to reverse the heparin.  The wounds were irrigated with saline.  Hemostasis was obtained with electrocautery.  The wounds were closed with 3-0 Vicryl to reapproximate the sternocleidomastoid over the carotid sheath.  The platysma was lysed with a running 3-0 Vicryl suture.  The skin was closed with a 4-0 subcuticular Vicryl stitch.  Dermabond was applied.  The patient was awakened neurologically intact in the operating room and transferred to the recovery room in stable condition  Patient did have high carotid bifurcation   Curt Jews, M.D. 04/04/2018 12:14 PM

## 2018-04-04 NOTE — Anesthesia Postprocedure Evaluation (Signed)
Anesthesia Post Note  Patient: AMBRE KOBAYASHI  Procedure(s) Performed: RIGHT ENDARTERECTOMY CAROTID (Right Neck) PATCH ANGIOPLAST USING HEMASHIELD PLATINUM FINESSE (Right Neck)     Patient location during evaluation: PACU Anesthesia Type: General Level of consciousness: sedated Pain management: pain level controlled Vital Signs Assessment: post-procedure vital signs reviewed and stable Respiratory status: spontaneous breathing Cardiovascular status: stable Postop Assessment: no apparent nausea or vomiting Anesthetic complications: no    Last Vitals:  Vitals:   04/04/18 1237 04/04/18 1245  BP: (!) 170/84 129/69  Pulse:  66  Resp:  14  Temp:    SpO2:  99%    Last Pain:  Vitals:   04/04/18 1245  TempSrc:   PainSc: 3    Pain Goal: Patients Stated Pain Goal: 3 (04/04/18 0829)  LLE Motor Response: Purposeful movement (04/04/18 1245) LLE Sensation: Full sensation (04/04/18 1245) RLE Motor Response: Purposeful movement (04/04/18 1245) RLE Sensation: Full sensation (04/04/18 1245)        Huston Foley

## 2018-04-04 NOTE — Anesthesia Procedure Notes (Addendum)
Procedure Name: Intubation Date/Time: 04/04/2018 10:08 AM Performed by: Jenne Campus, CRNA Pre-anesthesia Checklist: Patient identified, Emergency Drugs available, Suction available, Patient being monitored and Timeout performed Patient Re-evaluated:Patient Re-evaluated prior to induction Oxygen Delivery Method: Circle system utilized Preoxygenation: Pre-oxygenation with 100% oxygen Induction Type: IV induction Ventilation: Oral airway inserted - appropriate to patient size Laryngoscope Size: Mac and 3 Grade View: Grade I Tube type: Oral Tube size: 7.0 mm Number of attempts: 1 Placement Confirmation: ETT inserted through vocal cords under direct vision,  positive ETCO2,  CO2 detector and breath sounds checked- equal and bilateral Secured at: 22 cm Tube secured with: Tape

## 2018-04-04 NOTE — Anesthesia Procedure Notes (Signed)
Arterial Line Insertion Start/End3/13/2020 10:10 AM, 04/04/2018 10:25 AM Performed by: Jenne Campus, CRNA, CRNA  Preanesthetic checklist: patient identified, IV checked, risks and benefits discussed, surgical consent, monitors and equipment checked and pre-op evaluation Lidocaine 1% used for infiltration and patient sedated Left, radial was placed Catheter size: 20 G Hand hygiene performed  and maximum sterile barriers used   Attempts: 1 Procedure performed without using ultrasound guided technique. Following insertion, dressing applied and Biopatch. Post procedure assessment: normal  Patient tolerated the procedure well with no immediate complications.

## 2018-04-04 NOTE — Anesthesia Preprocedure Evaluation (Addendum)
Anesthesia Evaluation  Patient identified by MRN, date of birth, ID band Patient awake    Reviewed: Allergy & Precautions, NPO status , Patient's Chart, lab work & pertinent test results  Airway Mallampati: II  TM Distance: >3 FB Neck ROM: Full    Dental  (+) Dental Advisory Given, Poor Dentition, Chipped, Missing,    Pulmonary neg pulmonary ROS, Current Smoker, former smoker,    Pulmonary exam normal        Cardiovascular hypertension, Pt. on medications and Pt. on home beta blockers negative cardio ROS Normal cardiovascular exam Rhythm:Regular Rate:Normal  Nl cardiolyte 04/2014   Neuro/Psych PSYCHIATRIC DISORDERS Schizophrenia negative neurological ROS     GI/Hepatic negative GI ROS, Neg liver ROS, (+) Hepatitis -, C  Endo/Other  negative endocrine ROSdiabetes  Renal/GU CRFRenal diseasenegative Renal ROS  negative genitourinary   Musculoskeletal negative musculoskeletal ROS (+)   Abdominal Normal abdominal exam  (+)   Peds  Hematology negative hematology ROS (+)   Anesthesia Other Findings  Ref Range & Units 5d ago 76moago 257yrgo Opiates NONE DETECTED NONE DETECTED  POSITIVE Abnormal   NONE DETECTED  Cocaine NONE DETECTED POSITIVE Abnormal   POSITIVE Abnormal   POSITIVE Abnormal   Benzodiazepines NONE DETECTED NONE DETECTED  POSITIVE Abnormal   NONE DETECTED  Repeating tox screen this AM  Reproductive/Obstetrics                             Lab Results  Component Value Date   WBC 8.5 04/04/2018   HGB 9.1 (L) 04/04/2018   HCT 29.3 (L) 04/04/2018   MCV 90.4 04/04/2018   PLT 378 04/04/2018   Lab Results  Component Value Date   CREATININE 1.64 (H) 04/04/2018   BUN 21 04/04/2018   NA 136 04/04/2018   K 3.5 04/04/2018   CL 105 04/04/2018   CO2 21 (L) 04/04/2018    Anesthesia Physical  Anesthesia Plan  ASA: III  Anesthesia Plan: General   Post-op Pain Management:     Induction: Intravenous  PONV Risk Score and Plan: 2 and Treatment may vary due to age or medical condition, Ondansetron and Dexamethasone  Airway Management Planned: Oral ETT  Additional Equipment: Arterial line  Intra-op Plan:   Post-operative Plan: Extubation in OR  Informed Consent: I have reviewed the patients History and Physical, chart, labs and discussed the procedure including the risks, benefits and alternatives for the proposed anesthesia with the patient or authorized representative who has indicated his/her understanding and acceptance.     Dental advisory given  Plan Discussed with: CRNA  Anesthesia Plan Comments:        Anesthesia Quick Evaluation

## 2018-04-04 NOTE — H&P (Signed)
Office Visit   02/03/2018 [facility]    , Arvilla Meres, MD  Vascular Surgery   Stenosis of right carotid artery  Dx   Referred by [facility]  Reason for Visit   Additional Documentation   Vitals:   BP 143/81 (BP Location: Right Arm, Patient Position: Sitting, Cuff Size: Normal)   Pulse 78   Temp 96.9 F (36.1 C) (Temporal)   Resp 16   Ht 5' 1"  (1.549 m)   Wt 59 kg   BMI 24.56 kg/m   BSA 1.59 m   Pain [place] 0-No pain     More Vitals   Flowsheets:   MEWS Score,   Anthropometrics     Encounter Info:   Billing Info,   History,   Allergies,   Detailed Report     All Notes   Consult Note signed by Default, Provider, MD at 02/26/2018 11:05 AM   Author: Default, Provider, MD Author Type: Physician Filed: 02/26/2018 11:05 AM  Completion Status: Authenticated Availability: Available Document ID: [id number]  Note Status: Signed Encounter Date: 02/03/2018    Editor: Default, Provider, MD (Physician)    Transcription Details: Consult Note - Scan  Dictated By: Default, Provider, MD Dictated Date: 02/03/2018 Dictated Time: 4:01 PM  Transcribed By: Not found Transcribed Date: Not found Transcribed Time: Not found  Authorized By: Golden Pop, Provider, MD Authorization Date: 02/26/2018 Authorization Time: 11:05 AM          Scan on 02/26/2018 11:05 AM by Default, Provider, MD  Progress Notes by Rosetta Posner, MD at 02/03/2018 11:00 AM  Author: Rosetta Posner, MD Author Type: Physician Filed: 02/03/2018 12:21 PM  Note Status: Signed Cosign: Cosign Not Required Encounter Date: 02/03/2018  Editor: Rosetta Posner, MD (Physician)                                        [facility]  Patient name: Cassandra Jordan      MRN: [id number]        DOB: 12/03/1950            Sex: female  REASON FOR CONSULT: Symptomatic right carotid stenosis  HPI: Cassandra Jordan is a 68 y.o. female, who is in today for  discussion of her symptomatic right carotid stenosis.  She is here today with her daughter Cassandra Jordan.  Have her daughter for history and also her chart from several admissions to [facility].  Recently she was discharged from the hospital 1 month ago with stroke.  She was discharged to a nursing facility.  She did live independently prior to this admission.  She had a neurological event in April 2019.  Work-up at that time included a CT angiogram showing critical stenosis of her right internal carotid artery.  MR suggested potential strokes in both her right and left hemisphere.  She did not follow-up for further evaluation after this.  She was admitted with a new deficit in December 2019.  She has had persistent deficits which she and her daughter described as confusion and some slurred speech and also weakness in both lower extremities.  She reports this may be worse on her right than on her left.  Does have a history of her heart rate.  Has a history of extensive alcohol and cocaine abuse in the past and this was active up  until her admission in December 2019.      Past Medical History:  Diagnosis Date  . Alcohol abuse   . Arthritis   . Asthma   . Cocaine abuse ([place])   . Essential hypertension   . GERD (gastroesophageal reflux disease)   . Hepatitis C   . Hyperlipidemia   . Lichen sclerosus et atrophicus 10/13/2014  . Neuropathic pain   . Noncompliance   . PSVT (paroxysmal supraventricular tachycardia) ([place])   . Rash   . Renal carcinoma ([place])   . Sleep apnea   . Type 2 diabetes mellitus ([place])   . Vaginal irritation 10/01/2014  . Vaginal itching 10/01/2014  . Yeast infection of the vagina 10/01/2014         Family History  Problem Relation Age of Onset  . Colon cancer Sister 75  . Cancer Mother   . Heart attack Father   . Hypertension Daughter   . Cancer Maternal Grandfather   . Diabetes Paternal Grandmother     SOCIAL HISTORY: Social History         Socioeconomic History  . Marital status: Single    Spouse name: Not on file  . Number of children: Not on file  . Years of education: Not on file  . Highest education level: Not on file  Occupational History  . Not on file  Social Needs  . Financial resource strain: Not on file  . Food insecurity:    Worry: Not on file    Inability: Not on file  . Transportation needs:    Medical: Not on file    Non-medical: Not on file  Tobacco Use  . Smoking status: Current Some Day Smoker    Packs/day: 0.10    Years: 40.00    Pack years: 4.00    Types: Cigarettes  . Smokeless tobacco: Never Used  Substance and Sexual Activity  . Alcohol use: Not Currently    Alcohol/week: 0.0 standard drinks    Comment: rarely  . Drug use: Yes    Types: Marijuana, Cocaine    Comment: last used yesterday  . Sexual activity: Not Currently    Birth control/protection: Surgical    Comment: hyst  Lifestyle  . Physical activity:    Days per week: Not on file    Minutes per session: Not on file  . Stress: Not on file  Relationships  . Social connections:    Talks on phone: Not on file    Gets together: Not on file    Attends religious service: Not on file    Active member of club or organization: Not on file    Attends meetings of clubs or organizations: Not on file    Relationship status: Not on file  . Intimate partner violence:    Fear of current or ex partner: Not on file    Emotionally abused: Not on file    Physically abused: Not on file    Forced sexual activity: Not on file  Other Topics Concern  . Not on file  Social History Narrative  . Not on file         Allergies  Allergen Reactions  . Sulfa Antibiotics Itching and Other (See Comments)    burning          Current Outpatient Medications  Medication Sig Dispense Refill  . amiodarone (PACERONE) 100 MG tablet Take 1 tablet (100 mg total) by mouth daily. TAKE ONE TABLET  BY MOUTH ONCE DAILY 90 tablet 1  .  amLODipine (NORVASC) 10 MG tablet Take 1 tablet (10 mg total) by mouth daily. 30 tablet 1  . aspirin 325 MG tablet Take 1 tablet (325 mg total) by mouth daily.    Marland Kitchen atorvastatin (LIPITOR) 80 MG tablet Take 1 tablet (80 mg total) by mouth daily at 6 PM.    . carvedilol (COREG) 6.25 MG tablet Take 6.25 mg by mouth 2 (two) times daily.  0  . cloNIDine (CATAPRES - DOSED IN MG/24 HR) 0.1 mg/24hr patch Place 1 patch (0.1 mg total) onto the skin once a week. 4 patch 12  . clopidogrel (PLAVIX) 75 MG tablet Take 1 tablet (75 mg total) by mouth daily with breakfast.    . fenofibrate (TRICOR) 145 MG tablet Take 145 mg by mouth daily.    . haloperidol (HALDOL) 5 MG tablet Take 5 mg by mouth 2 (two) times daily.    . haloperidol lactate (HALDOL) 5 MG/ML injection every 6 (six) hours as needed.    Marland Kitchen QUEtiapine (SEROQUEL) 200 MG tablet Take 200 mg by mouth at bedtime.    Marland Kitchen QUEtiapine (SEROQUEL) 400 MG tablet Take 400 mg by mouth at bedtime.     No current facility-administered medications for this visit.     REVIEW OF SYSTEMS:  [X]  denotes positive finding, [ ]  denotes negative finding Cardiac  Comments:  Chest pain or chest pressure:    Shortness of breath upon exertion:    Short of breath when lying flat:    Irregular heart rhythm: x       Vascular    Pain in calf, thigh, or hip brought on by ambulation:    Pain in feet at night that wakes you up from your sleep:     Blood clot in your veins:    Leg swelling:         Pulmonary    Oxygen at home:    Productive cough:     Wheezing:         Neurologic    Sudden weakness in arms or legs:  x   Sudden numbness in arms or legs:  x   Sudden onset of difficulty speaking or slurred speech:    Temporary loss of vision in one eye:     Problems with dizziness:         Gastrointestinal    Blood in stool:     Vomited blood:          Genitourinary    Burning when urinating:     Blood in urine:        Psychiatric    Major depression:         Hematologic    Bleeding problems:    Problems with blood clotting too easily:        Skin    Rashes or ulcers:        Constitutional    Fever or chills:      PHYSICAL EXAM:     Vitals:   02/03/18 1121 02/03/18 1122  BP: 138/78 (!) 143/81  Pulse: 80 78  Resp: 16   Temp: (!) 96.9 F (36.1 C)   TempSrc: Temporal   Weight: 130 lb (59 kg)   Height: 5' 1"  (1.549 m)     GENERAL: The patient is a well-nourished female, in no acute distress. The vital signs are documented above. CARDIOVASCULAR: Carotid arteries are without bruits bilaterally.  She has 2+ radial and 2+ dorsalis pedis pulses bilaterally PULMONARY: There is good air exchange  ABDOMEN: Soft and non-tender  MUSCULOSKELETAL: There are no major deformities or cyanosis. NEUROLOGIC: No focal weakness or paresthesias are detected.  She is able to walk with assistance.  She does not require a walker or cane.  She is somewhat unsteady in her gait.  She does answer questions appropriately but is somewhat slow SKIN: There are no ulcers or rashes noted. PSYCHIATRIC: The patient has a normal affect.  DATA:  Reviewed her CT angiogram showing high-grade calcified stenosis of the right internal carotid with normal internal carotid artery above this.  MEDICAL ISSUES: With the patient and her daughter present.  I explained the concern of recurrent neurologic deficits related to her high-grade right carotid stenosis.  I have recommended endarterectomy for reduction of stroke risk.  I explained the timing of this would depend on her feeling of her achieving a baseline recovery.  The daughter feels that she has stabilized and wishes to proceed electively as soon as possible.  I did discuss the procedure of endarterectomy with an expected one night hospitalization.  Also explained  the slight risk of stroke associated with the procedure and other complications.  We will schedule this at her earliest [place]. , MD [facility] [phone] Pager [phone]       Addendum:  The patient has been re-examined and re-evaluated.  The patient's history and physical has been reviewed and is unchanged.    Cassandra Jordan is a 68 y.o. female is being admitted with RIGHT CAROTID STENOSIS. All the risks, benefits and other treatment options have been discussed with the patient. The patient has consented to proceed with Procedure(s): RIGHT ENDARTERECTOMY CAROTID as a surgical intervention.    04/04/2018 9:40 AM Vascular and Vein Surgery

## 2018-04-05 LAB — CBC
HCT: 22.3 % — ABNORMAL LOW (ref 36.0–46.0)
Hemoglobin: 6.6 g/dL — CL (ref 12.0–15.0)
MCH: 26.9 pg (ref 26.0–34.0)
MCHC: 29.6 g/dL — ABNORMAL LOW (ref 30.0–36.0)
MCV: 91 fL (ref 80.0–100.0)
Platelets: 315 10*3/uL (ref 150–400)
RBC: 2.45 MIL/uL — ABNORMAL LOW (ref 3.87–5.11)
RDW: 15.9 % — AB (ref 11.5–15.5)
WBC: 11.4 10*3/uL — ABNORMAL HIGH (ref 4.0–10.5)
nRBC: 0 % (ref 0.0–0.2)

## 2018-04-05 LAB — GLUCOSE, CAPILLARY: GLUCOSE-CAPILLARY: 119 mg/dL — AB (ref 70–99)

## 2018-04-05 LAB — BASIC METABOLIC PANEL
ANION GAP: 8 (ref 5–15)
BUN: 22 mg/dL (ref 8–23)
CO2: 22 mmol/L (ref 22–32)
Calcium: 8.1 mg/dL — ABNORMAL LOW (ref 8.9–10.3)
Chloride: 106 mmol/L (ref 98–111)
Creatinine, Ser: 1.59 mg/dL — ABNORMAL HIGH (ref 0.44–1.00)
GFR calc Af Amer: 39 mL/min — ABNORMAL LOW (ref >60)
GFR calc non Af Amer: 33 mL/min — ABNORMAL LOW (ref >60)
Glucose, Bld: 107 mg/dL — ABNORMAL HIGH (ref 70–99)
Potassium: 4.1 mmol/L (ref 3.5–5.1)
SODIUM: 136 mmol/L (ref 135–145)

## 2018-04-05 LAB — HEMOGLOBIN AND HEMATOCRIT, BLOOD
HCT: 23.7 % — ABNORMAL LOW (ref 36.0–46.0)
Hemoglobin: 7.3 g/dL — ABNORMAL LOW (ref 12.0–15.0)

## 2018-04-05 LAB — PREPARE RBC (CROSSMATCH)

## 2018-04-05 NOTE — Progress Notes (Addendum)
  Progress Note    04/05/2018 9:28 AM 1 Day Post-Op  Subjective:  No complaints this morning   Vitals:   04/04/18 1946 04/05/18 0448  BP: (!) 150/78 125/67  Pulse: 77 79  Resp: 14 17  Temp: 98.2 F (36.8 C) 98.6 F (37 C)  SpO2: 96% 95%   Physical Exam: Lungs:  Non labored Incisions:  R neck incision with some local swelling but soft, no drainage Extremities:  Symmetrical grip strength Neurologic: baseline  CBC    Component Value Date/Time   WBC 11.4 (H) 04/05/2018 0500   RBC 2.45 (L) 04/05/2018 0500   HGB 6.6 (LL) 04/05/2018 0500   HCT 22.3 (L) 04/05/2018 0500   HCT 27.7 (L) 02/16/2018 0516   PLT 315 04/05/2018 0500   MCV 91.0 04/05/2018 0500   MCH 26.9 04/05/2018 0500   MCHC 29.6 (L) 04/05/2018 0500   RDW 15.9 (H) 04/05/2018 0500   LYMPHSABS 3.8 02/16/2018 0516   MONOABS 1.9 (H) 02/16/2018 0516   EOSABS 0.1 02/16/2018 0516   BASOSABS 0.1 02/16/2018 0516    BMET    Component Value Date/Time   NA 136 04/05/2018 0500   K 4.1 04/05/2018 0500   CL 106 04/05/2018 0500   CO2 22 04/05/2018 0500   GLUCOSE 107 (H) 04/05/2018 0500   BUN 22 04/05/2018 0500   CREATININE 1.59 (H) 04/05/2018 0500   CREATININE 1.14 (H) 05/30/2016 0911   CALCIUM 8.1 (L) 04/05/2018 0500   GFRNONAA 33 (L) 04/05/2018 0500   GFRNONAA 51 (L) 05/30/2016 0911   GFRAA 39 (L) 04/05/2018 0500   GFRAA 58 (L) 05/30/2016 0911    INR    Component Value Date/Time   INR 1.1 04/04/2018 0827     Intake/Output Summary (Last 24 hours) at 04/05/2018 3735 Last data filed at 04/05/2018 0011 Gross per 24 hour  Intake 2406 ml  Output 100 ml  Net 2306 ml     Assessment/Plan:  68 y.o. female is s/p R CEA 1 Day Post-Op   R neck incision unremarkable Neuro exam at baseline Hgb 6.6 this morning; repeat H&H Potential discharge this afternoon if Hgb ok, ambulating well, and tolerating a diet More likely discharge tomorrow am   Dagoberto Ligas, PA-C [facility] [phone]  04/05/2018 9:28 AM  I have examined the patient, reviewed and agree with above.  Comfortable.  Neuro status at baseline.  Significant drop in her H&H.  Minimal blood loss at surgery yesterday.  Repeat pending.  Possible discharge today if H&H stable  Curt Jews, MD 04/05/2018 10:13 AM

## 2018-04-05 NOTE — Progress Notes (Signed)
Repeat Hgb 7.3.  We will transfuse 2 units PRBCs and keep patient overnight.  Possible discharge tomorrow am 3/15.

## 2018-04-06 LAB — TYPE AND SCREEN
ABO/RH(D): A POS
Antibody Screen: NEGATIVE
Unit division: 0
Unit division: 0

## 2018-04-06 LAB — BPAM RBC
Blood Product Expiration Date: 202004032359
Blood Product Expiration Date: 202004032359
ISSUE DATE / TIME: 202003141359
ISSUE DATE / TIME: 202003141702
Unit Type and Rh: 6200
Unit Type and Rh: 6200

## 2018-04-06 LAB — CBC
HCT: 38.8 % (ref 36.0–46.0)
Hemoglobin: 12.1 g/dL (ref 12.0–15.0)
MCH: 28.3 pg (ref 26.0–34.0)
MCHC: 31.2 g/dL (ref 30.0–36.0)
MCV: 90.7 fL (ref 80.0–100.0)
Platelets: 309 10*3/uL (ref 150–400)
RBC: 4.28 MIL/uL (ref 3.87–5.11)
RDW: 15.4 % (ref 11.5–15.5)
WBC: 11 10*3/uL — ABNORMAL HIGH (ref 4.0–10.5)
nRBC: 0 % (ref 0.0–0.2)

## 2018-04-06 MED ORDER — OXYCODONE-ACETAMINOPHEN 5-325 MG PO TABS: 1.0000 | ORAL_TABLET | Freq: Four times a day (QID) | ORAL | 0 refills | Status: DC | PRN

## 2018-04-06 MED ORDER — OXYCODONE-ACETAMINOPHEN 5-325 MG PO TABS: 1.0000 | ORAL_TABLET | Freq: Four times a day (QID) | ORAL | 0 refills | Status: AC | PRN

## 2018-04-06 NOTE — Discharge Summary (Signed)
Discharge Summary     Cassandra Jordan 1950/01/27 68 y.o. female  [id number]  Admission Date: 04/04/2018  Discharge Date: 04/06/18  Physician: Rosetta Posner, MD  Admission Diagnosis: RIGHT CAROTID STENOSIS  Discharge Day services:    see progress note 04/06/18 Physical Exam: Vitals:   04/06/18 0700 04/06/18 0807  BP: 128/68 (!) 184/94  Pulse: 77 79  Resp: 19 15  Temp:  98.6 F (37 C)  SpO2: 92% 98%    Hospital Course:  The patient was admitted to the hospital and taken to the operating room on 04/04/2018 and underwent right carotid endarterectomy.  The pt tolerated the procedure well and was transported to the PACU in good condition.   By POD 1, the pt neuro status remains at baseline.  Based on morning drawn labs she experienced acute blood loss anemia and received 2 units of packed red cells.  POD #2 recheck hemoglobin now 12.  No neurological events overnight.  Patient is anticipating discharge back to skilled nursing facility.  She will follow-up in office with Dr. early in 2 weeks.  She will be discharged this morning/afternoon in stable condition.     Recent Labs    04/04/18 0827 04/05/18 0500  NA 136 136  K 3.5 4.1  CL 105 106  CO2 21* 22  GLUCOSE 102* 107*  BUN 21 22  CALCIUM 9.0 8.1*   Recent Labs    04/05/18 0500 04/05/18 1020 04/06/18 0815  WBC 11.4*  --  11.0*  HGB 6.6* 7.3* 12.1  HCT 22.3* 23.7* 38.8  PLT 315  --  309   Recent Labs    04/04/18 0827  INR 1.1       Discharge Diagnosis:  RIGHT CAROTID STENOSIS  Secondary Diagnosis: Patient Active Problem List   Diagnosis Date Noted  . Carotid artery stenosis 04/04/2018  . Aspiration pneumonitis ([place]) 02/20/2018  . Hepatic encephalopathy ([place]) 02/17/2018  . Chronic combined systolic and diastolic CHF (congestive heart failure) ([place]) 02/15/2018  . History of CVA in adulthood 02/15/2018  . Acute encephalopathy   . Advanced care planning/counseling discussion   . Palliative care  by specialist   . Cerebellar stroke ([place])   . Prolonged QT interval 12/23/2017  . Hyperphosphatemia 05/22/2017  . Acute CVA (cerebrovascular accident) ([place]) 05/21/2017  . Abdominal pain 11/18/2016  . Cholelithiasis 11/18/2016  . Uncontrolled type 2 diabetes mellitus with hyperglycemia, with long-term current use of insulin ([place]) 11/18/2016  . CKD (chronic kidney disease), stage III ([place]) 11/18/2016  . Acute pancreatitis   . Ileus ([place])   . Porcelain gallbladder   . Acute renal failure superimposed on stage 3 chronic kidney disease ([place]) 11/04/2016  . Hypokalemia 11/01/2016  . Hypomagnesemia 11/01/2016  . SBO (small bowel obstruction) ([place])   . AKI (acute kidney injury) ([place]) 10/30/2016  . Leukocytosis 10/30/2016  . GERD (gastroesophageal reflux disease) 10/29/2016  . Small bowel obstruction ([place]) 10/29/2016  . History of drug use 07/17/2016  . Constipation 05/30/2016  . Fecal incontinence 01/04/2016  . Hepatic cirrhosis ([place]) 04/08/2015  . History of adenomatous polyp of colon   . History of colonic polyps   . Hx of adenomatous colonic polyps 12/10/2014  . Lichen sclerosus et atrophicus 10/13/2014  . Vaginal itching 10/01/2014  . Vaginal irritation 10/01/2014  . Yeast infection of the vagina 10/01/2014  . Blurred vision, right eye 03/20/2014  . Cocaine abuse ([place]) 03/20/2014  . Generalized weakness 03/20/2014  . Chest pain 02/10/2014  . Chest pain at  rest 02/10/2014  . Type 2 diabetes mellitus ([place])   . Diabetes mellitus, type II ([place])   . Tobacco abuse   . PSVT (paroxysmal supraventricular tachycardia) ([place]) 11/04/2013  . Essential hypertension 10/16/2013  . Renal mass 10/14/2013  . Ovarian mass, left 08/17/2013  . Hepatitis C, chronic ([place]) 07/01/2013  . Elevated LFTs 07/01/2013  . Other dysphagia 07/01/2013  . Goals of care, counseling/discussion 07/01/2013   Past Medical History:  Diagnosis Date  . Alcohol abuse   . Arthritis   . Asthma   . Carotid stenosis    . Cocaine abuse ([place])   . Essential hypertension   . GERD (gastroesophageal reflux disease)   . Hepatitis C   . Hyperlipidemia   . Lichen sclerosus et atrophicus 10/13/2014  . Neuropathic pain   . Noncompliance   . PSVT (paroxysmal supraventricular tachycardia) ([place])   . Rash   . Renal carcinoma ([place])   . Sleep apnea   . Type 2 diabetes mellitus ([place])   . Vaginal irritation 10/01/2014  . Vaginal itching 10/01/2014  . Yeast infection of the vagina 10/01/2014    Allergies as of 04/06/2018      Reactions   Sulfa Antibiotics Itching, Other (See Comments)   burning      Medication List    TAKE these medications   acetaminophen 325 MG tablet Commonly known as:  TYLENOL Take 325 mg by mouth 2 (two) times daily as needed (pain.).   amLODipine 10 MG tablet Commonly known as:  NORVASC Take 1 tablet (10 mg total) by mouth daily.   amoxicillin-clavulanate 500-125 MG tablet Commonly known as:  AUGMENTIN Take 1 tablet (500 mg total) by mouth 2 (two) times daily.   aspirin 325 MG tablet Take 1 tablet (325 mg total) by mouth daily.   atorvastatin 40 MG tablet Commonly known as:  LIPITOR Take 40 mg by mouth at bedtime.   carvedilol 6.25 MG tablet Commonly known as:  COREG Take 6.25 mg by mouth 2 (two) times daily.   cholestyramine 4 g packet Commonly known as:  QUESTRAN Take 4 g by mouth 2 (two) times daily.   clopidogrel 75 MG tablet Commonly known as:  PLAVIX Take 1 tablet (75 mg total) by mouth daily with breakfast.   famotidine 20 MG tablet Commonly known as:  PEPCID Take 20 mg by mouth daily.   fenofibrate 145 MG tablet Commonly known as:  TRICOR Take 145 mg by mouth daily.   haloperidol 2 MG tablet Commonly known as:  HALDOL Take 2 mg by mouth 3 (three) times daily.   lactulose 10 GM/15ML solution Commonly known as:  CHRONULAC Take 30 mLs (20 g total) by mouth 2 (two) times daily. What changed:  when to take this   magnesium hydroxide 400 MG/5ML suspension  Commonly known as:  MILK OF MAGNESIA Take 30 mLs by mouth daily as needed for mild constipation.   oxyCODONE-acetaminophen 5-325 MG tablet Commonly known as:  PERCOCET/ROXICET Take 1 tablet by mouth every 6 (six) hours as needed for moderate pain.   QUEtiapine 300 MG tablet Commonly known as:  SEROQUEL Take 300 mg by mouth at bedtime.   QUEtiapine 200 MG tablet Commonly known as:  SEROQUEL Take 200 mg by mouth at bedtime.   rifaximin 550 MG Tabs tablet Commonly known as:  XIFAXAN Take 1 tablet (550 mg total) by mouth 2 (two) times daily.   Zinc Oxide 24 % Crea Apply 1 application topically every 2 (two) hours as needed (  applied to sacrum and perineal areas for excoriation).        Discharge Instructions:   [facility] Discharge Instructions Carotid Endarterectomy (CEA)  Please refer to the following instructions for your post-procedure care. Your surgeon or physician assistant will discuss any changes with you.  Activity  You are encouraged to walk as much as you can. You can slowly return to normal activities but must avoid strenuous activity and heavy lifting until your doctor tell you it's OK. Avoid activities such as vacuuming or swinging a golf club. You can drive after one week if you are comfortable and you are no longer taking prescription pain medications. It is normal to feel tired for serval weeks after your surgery. It is also normal to have difficulty with sleep habits, eating, and bowel movements after surgery. These will go away with time.  Bathing/Showering  You may shower after you come home. Do not soak in a bathtub, hot tub, or swim until the incision heals completely.  Incision Care  Shower every day. Clean your incision with mild soap and water. Pat the area dry with a clean towel. You do not need a bandage unless otherwise instructed. Do not apply any ointments or creams to your incision. You may have skin glue on your  incision. Do not peel it off. It will come off on its own in about one week. Your incision may feel thickened and raised for several weeks after your surgery. This is normal and the skin will soften over time. For Men Only: It's OK to shave around the incision but do not shave the incision itself for 2 weeks. It is common to have numbness under your chin that could last for several months.  Diet  Resume your normal diet. There are no special food restrictions following this procedure. A low fat/low cholesterol diet is recommended for all patients with vascular disease. In order to heal from your surgery, it is CRITICAL to get adequate nutrition. Your body requires vitamins, minerals, and protein. Vegetables are the best source of vitamins and minerals. Vegetables also provide the perfect balance of protein. Processed food has little nutritional value, so try to avoid this.  Medications  Resume taking all of your medications unless your doctor or physician assistant tells you not to.  If your incision is causing pain, you may take over-the- counter pain relievers such as acetaminophen (Tylenol). If you were prescribed a stronger pain medication, please be aware these medications can cause nausea and constipation.  Prevent nausea by taking the medication with a snack or meal. Avoid constipation by drinking plenty of fluids and eating foods with a high amount of fiber, such as fruits, vegetables, and grains. Do not take Tylenol if you are taking prescription pain medications.  Follow Up  Our office will schedule a follow up appointment 2-3 weeks following discharge.  Please call us immediately for any of the following conditions  Increased pain, redness, drainage (pus) from your incision site. Fever of 101 degrees or higher. If you should develop stroke (slurred speech, difficulty swallowing, weakness on one side of your body, loss of vision) you should call 911 and go to the nearest emergency room.   Reduce your risk of vascular disease:  Stop smoking. If you would like help call [facility] at 1-800-QUIT-NOW 218-838-9580) or [place] at 469-743-7503. Manage your cholesterol Maintain a desired weight Control your diabetes Keep your blood pressure down  If you have any questions, please call  the office at [phone].   Disposition: SNF  Patient's condition: is Good  Follow up: 1. Dr. Donnetta Hutching in 2 weeks.   Dagoberto Ligas, PA-C [facility] [phone]   --- For [facility] Registry use ---   Modified Rankin score at D/C (0-6): 1  IV medication needed for:  1. Hypertension: No 2. Hypotension: No  Post-op Complications: No  1. Post-op CVA or TIA: No  If yes: Event classification (right eye, left eye, right cortical, left cortical, verterobasilar, other):   If yes: Timing of event (intra-op, <6 hrs post-op, >=6 hrs post-op, unknown):   2. CN injury: No  If yes: CN  injuried   3. Myocardial infarction: No  If yes: Dx by (EKG or clinical, Troponin):   4.  CHF: No  5.  Dysrhythmia (new): No  6. Wound infection: No  7. Reperfusion symptoms: No  8. Return to OR: No  If yes: return to OR for (bleeding, neurologic, other CEA incision, other):   Discharge medications: Statin use:  Yes ASA use:  Yes   Beta blocker use:  Yes ACE-Inhibitor use:  No  ARB use:  No CCB use: Yes P2Y12 Antagonist use: Yes, [ x] Plavix, [ ]  Plasugrel, [ ]  Ticlopinine, [ ]  Ticagrelor, [ ]  Other, [ ]  No for medical reason, [ ]  Non-compliant, [ ]  Not-indicated Anti-coagulant use:  No, [ ]  Warfarin, [ ]  Rivaroxaban, [ ]  Dabigatran,

## 2018-04-06 NOTE — TOC Transition Note (Signed)
Transition of Care [facility]) - CM/SW Discharge Note   Patient Details  Name: Cassandra Jordan MRN: [id number] Date of Birth: 1950/06/28  Transition of Care [facility]) CM/SW Contact:  Wende Neighbors, LCSW Phone Number: 04/06/2018, 11:13 AM   Clinical Narrative:   Patient going to [facility]. Family transporting patient back to facility. RN to please call report to [phone] (pt going back to [address] hall )     Final next level of care: [place] Barriers to Discharge: No Barriers Identified   Patient Goals and CMS Choice   CMS Medicare.gov Compare Post Acute Care list provided to:: Other (Comment Required)(pt is LTC at facility)    Discharge Placement              Patient chooses bed at: [facility] Patient to be transferred to facility by: pt daughter stated she will take pateint to facility Name of family member notified: pt daughter aware of dc Patient and family notified of of transfer: 04/06/18  Discharge Plan and Services                        Social Determinants of Health (SDOH) Interventions     Readmission Risk Interventions No flowsheet data found.

## 2018-04-06 NOTE — Progress Notes (Signed)
04/06/2018 1200 Attempted to call report to the [facility].  Nurse said she would call me back. [place]

## 2018-04-06 NOTE — TOC Initial Note (Signed)
Transition of Care [facility]) - Initial/Assessment Note    Patient Details  Name: Cassandra Jordan MRN: [id number] Date of Birth: July 01, 1950  Transition of Care [facility]) CM/SW Contact:    Wende Neighbors, LCSW Phone Number: 04/06/2018, 10:43 AM  Clinical Narrative:  CSW spoke with patients daughter [name] via phone. [name] stated that patient is from [place] term section of the facility. [name] stated she will come pick patient up around 11 am and take her to facility via personal vehicle.. CSW spoke with admission coordinator Doug via phone and he stated everything is good for patient to return back to facility.             Expected Discharge Plan: Skilled Nursing Facility([facility]) Barriers to Discharge: No Barriers Identified   Patient Goals and CMS Choice   CMS Medicare.gov Compare Post Acute Care list provided to:: Other (Comment Required)(pt is LTC at facility)    Expected Discharge Plan and Services Expected Discharge Plan: Skilled Nursing Facility([facility])     Living arrangements for the past 2 months: [place] Expected Discharge Date: 04/06/18                        Prior Living Arrangements/Services Living arrangements for the past 2 months: [place] Lives with:: Facility Resident Patient language and need for interpreter reviewed:: No Do you feel safe going back to the place where you live?: Yes      Need for Family Participation in Patient Care: Yes (Comment) Care giver support system in place?: Yes (comment)   Criminal Activity/Legal Involvement Pertinent to Current Situation/Hospitalization: Yes - Comment as needed  Activities of Daily Living Home Assistive Devices/Equipment: Cane (specify quad or straight) ADL Screening (condition at time of admission) Patient's cognitive ability adequate to safely complete daily activities?: Yes Is the patient deaf or have difficulty hearing?:  No Does the patient have difficulty seeing, even when wearing glasses/contacts?: No Does the patient have difficulty concentrating, remembering, or making decisions?: No Patient able to express need for assistance with ADLs?: Yes Does the patient have difficulty dressing or bathing?: No Independently performs ADLs?: Yes (appropriate for developmental age) Does the patient have difficulty walking or climbing stairs?: Yes Weakness of Legs: Both Weakness of Arms/Hands: None  Permission Sought/Granted Permission sought to share information with : Family Supports Permission granted to share information with : Yes, Verbal Permission Granted  Share Information with NAME: Lemmie Evens  Permission granted to share info w AGENCY: [facility] granted to share info w Relationship: daughter  Permission granted to share info w Contact Information:   [phone]   Emotional Assessment Appearance:: Appears stated age Attitude/Demeanor/Rapport: Unable to Assess Affect (typically observed): Unable to Assess Orientation: : Oriented to Self, Oriented to  Time, Oriented to Situation Alcohol / Substance Use: Alcohol Use Psych Involvement: No (comment)  Admission diagnosis:  RIGHT CAROTID STENOSIS Patient Active Problem List   Diagnosis Date Noted  . Carotid artery stenosis 04/04/2018  . Aspiration pneumonitis ([place]) 02/20/2018  . Hepatic encephalopathy ([place]) 02/17/2018  . Chronic combined systolic and diastolic CHF (congestive heart failure) ([place]) 02/15/2018  . History of CVA in adulthood 02/15/2018  . Acute encephalopathy   . Advanced care planning/counseling discussion   . Palliative care by specialist   . Cerebellar stroke ([place])   . Prolonged QT interval 12/23/2017  . Hyperphosphatemia 05/22/2017  . Acute CVA (cerebrovascular accident) ([place]) 05/21/2017  .  Abdominal pain 11/18/2016  . Cholelithiasis 11/18/2016  . Uncontrolled type 2 diabetes mellitus with hyperglycemia,  with long-term current use of insulin ([place]) 11/18/2016  . CKD (chronic kidney disease), stage III ([place]) 11/18/2016  . Acute pancreatitis   . Ileus ([place])   . Porcelain gallbladder   . Acute renal failure superimposed on stage 3 chronic kidney disease ([place]) 11/04/2016  . Hypokalemia 11/01/2016  . Hypomagnesemia 11/01/2016  . SBO (small bowel obstruction) ([place])   . AKI (acute kidney injury) ([place]) 10/30/2016  . Leukocytosis 10/30/2016  . GERD (gastroesophageal reflux disease) 10/29/2016  . Small bowel obstruction ([place]) 10/29/2016  . History of drug use 07/17/2016  . Constipation 05/30/2016  . Fecal incontinence 01/04/2016  . Hepatic cirrhosis ([place]) 04/08/2015  . History of adenomatous polyp of colon   . History of colonic polyps   . Hx of adenomatous colonic polyps 12/10/2014  . Lichen sclerosus et atrophicus 10/13/2014  . Vaginal itching 10/01/2014  . Vaginal irritation 10/01/2014  . Yeast infection of the vagina 10/01/2014  . Blurred vision, right eye 03/20/2014  . Cocaine abuse ([place]) 03/20/2014  . Generalized weakness 03/20/2014  . Chest pain 02/10/2014  . Chest pain at rest 02/10/2014  . Type 2 diabetes mellitus ([place])   . Diabetes mellitus, type II ([place])   . Tobacco abuse   . PSVT (paroxysmal supraventricular tachycardia) ([place]) 11/04/2013  . Essential hypertension 10/16/2013  . Renal mass 10/14/2013  . Ovarian mass, left 08/17/2013  . Hepatitis C, chronic ([place]) 07/01/2013  . Elevated LFTs 07/01/2013  . Other dysphagia 07/01/2013  . Goals of care, counseling/discussion 07/01/2013   PCP:  Abran Richard, MD Pharmacy:   [place], [place] - [place] [address] [place] [address] [place] [address] Phone: [phone] Fax: [place], [place] - [place] [address] [place] [address] Phone: [phone] Fax: [phone]  [facility] [place], [place] [address] [place] [address] Phone: [phone] Fax: [phone]     Social Determinants of Health (SDOH) Interventions    Readmission Risk Interventions 30 Day Unplanned Readmission Risk Score     Admission (Current) from 04/04/2018 in [facility] 4E CV SURGICAL PROGRESSIVE CARE  30 Day Unplanned Readmission Risk Score (%)  48 Filed at 04/06/2018 0801     This score is the patient's risk of an unplanned readmission within 30 days of being discharged (0 -100%). The score is based on dignosis, age, lab data, medications, orders, and past utilization.   Low:  0-14.9   Medium: 15-21.9   High: 22-29.9   Extreme: 30 and above       No flowsheet data found.

## 2018-04-06 NOTE — Progress Notes (Signed)
Patient ID: Cassandra Jordan, female   DOB: 01/03/1951, 68 y.o.   MRN: [id number]  Progress Note    04/06/2018 8:54 AM 2 Days Post-Op  Subjective: Up in chair.  Neurologically at her baseline.  Follows commands.   Vitals:   04/06/18 0700 04/06/18 0807  BP: 128/68 (!) 184/94  Pulse: 77 79  Resp: 19 15  Temp:  98.6 F (37 C)  SpO2: 92% 98%   Physical Exam: Right neck incision healing without hematoma.  CBC    Component Value Date/Time   WBC 11.4 (H) 04/05/2018 0500   RBC 2.45 (L) 04/05/2018 0500   HGB 7.3 (L) 04/05/2018 1020   HCT 23.7 (L) 04/05/2018 1020   HCT 27.7 (L) 02/16/2018 0516   PLT 315 04/05/2018 0500   MCV 91.0 04/05/2018 0500   MCH 26.9 04/05/2018 0500   MCHC 29.6 (L) 04/05/2018 0500   RDW 15.9 (H) 04/05/2018 0500   LYMPHSABS 3.8 02/16/2018 0516   MONOABS 1.9 (H) 02/16/2018 0516   EOSABS 0.1 02/16/2018 0516   BASOSABS 0.1 02/16/2018 0516    BMET    Component Value Date/Time   NA 136 04/05/2018 0500   K 4.1 04/05/2018 0500   CL 106 04/05/2018 0500   CO2 22 04/05/2018 0500   GLUCOSE 107 (H) 04/05/2018 0500   BUN 22 04/05/2018 0500   CREATININE 1.59 (H) 04/05/2018 0500   CREATININE 1.14 (H) 05/30/2016 0911   CALCIUM 8.1 (L) 04/05/2018 0500   GFRNONAA 33 (L) 04/05/2018 0500   GFRNONAA 51 (L) 05/30/2016 0911   GFRAA 39 (L) 04/05/2018 0500   GFRAA 58 (L) 05/30/2016 0911    INR    Component Value Date/Time   INR 1.1 04/04/2018 0827     Intake/Output Summary (Last 24 hours) at 04/06/2018 0854 Last data filed at 04/06/2018 0800 Gross per 24 hour  Intake 369.33 ml  Output 900 ml  Net -530.67 ml     Assessment/Plan:  68 y.o. female repeat CBC pending this morning.  Received 2 units packed cells yesterday with acute postop blood loss anemia.  Also suspect related to hydration since she had minimal blood loss at surgery.  No evidence of hematoma.  Plan discharge today to skilled [place] Florence Antonelli, MD [facility] [phone] 04/06/2018 8:54 AM

## 2018-04-06 NOTE — Progress Notes (Signed)
04/06/2018 1220 Discharge AVS packet with scripts given to daughter to give to the [facility].  D/C IV and TELE.  Questions and concerns addressed.   D/C'd with daughter to [facility] per orders. [place]

## 2018-04-06 NOTE — Discharge Instructions (Signed)
   [facility] [place] [facility]  Discharge Instructions   Carotid Endarterectomy (CEA)  Please refer to the following instructions for your post-procedure care. Your surgeon or physician assistant will discuss any changes with you.  Activity  You are encouraged to walk as much as you can. You can slowly return to normal activities but must avoid strenuous activity and heavy lifting until your doctor tell you it's OK. Avoid activities such as vacuuming or swinging a golf club. You can drive after one week if you are comfortable and you are no longer taking prescription pain medications. It is normal to feel tired for serval weeks after your surgery. It is also normal to have difficulty with sleep habits, eating, and bowel movements after surgery. These will go away with time.  Bathing/Showering  You may shower after you come home. Do not soak in a bathtub, hot tub, or swim until the incision heals completely.  Incision Care  Shower every day. Clean your incision with mild soap and water. Pat the area dry with a clean towel. You do not need a bandage unless otherwise instructed. Do not apply any ointments or creams to your incision. You may have skin glue on your incision. Do not peel it off. It will come off on its own in about one week. Your incision may feel thickened and raised for several weeks after your surgery. This is normal and the skin will soften over time. For Men Only: It's OK to shave around the incision but do not shave the incision itself for 2 weeks. It is common to have numbness under your chin that could last for several months.  Diet  Resume your normal diet. There are no special food restrictions following this procedure. A low fat/low cholesterol diet is recommended for all patients with vascular disease. In order to heal from your surgery, it is CRITICAL to get adequate nutrition. Your body requires vitamins, minerals, and protein. Vegetables are the best  source of vitamins and minerals. Vegetables also provide the perfect balance of protein. Processed food has little nutritional value, so try to avoid this.        Medications  Resume taking all of your medications unless your doctor or physician assistant tells you not to. If your incision is causing pain, you may take over-the- counter pain relievers such as acetaminophen (Tylenol). If you were prescribed a stronger pain medication, please be aware these medications can cause nausea and constipation. Prevent nausea by taking the medication with a snack or meal. Avoid constipation by drinking plenty of fluids and eating foods with a high amount of fiber, such as fruits, vegetables, and grains. Do not take Tylenol if you are taking prescription pain medications.  Follow Up  Our office will schedule a follow up appointment 2-3 weeks following discharge.  Please call us immediately for any of the following conditions  Increased pain, redness, drainage (pus) from your incision site. Fever of 101 degrees or higher. If you should develop stroke (slurred speech, difficulty swallowing, weakness on one side of your body, loss of vision) you should call 911 and go to the nearest emergency room.  Reduce your risk of vascular disease:  Stop smoking. If you would like help call [facility] at 1-800-QUIT-NOW (1-800-784-8669) or  at 336-586-4000. Manage your cholesterol Maintain a desired weight Control your diabetes Keep your blood pressure down  If you have any questions, please call the office at [phone].   

## 2018-04-07 ENCOUNTER — Encounter (HOSPITAL_COMMUNITY): Payer: Self-pay | Admitting: Vascular Surgery

## 2018-04-07 ENCOUNTER — Telehealth: Payer: Self-pay | Admitting: Vascular Surgery

## 2018-04-07 NOTE — Telephone Encounter (Signed)
-----   Message from Dagoberto Ligas, PA-C sent at 04/06/2018  8:51 AM EDT -----  Can you schedule an appt for this pt in 2 weeks with Dr. Donnetta Hutching.  PO R CEA. Thanks, MAtt

## 2018-04-07 NOTE — Telephone Encounter (Signed)
sch appt spk to pt caregiver 04/22/2018 330pm p/o MD

## 2018-04-22 ENCOUNTER — Encounter: Payer: Medicare Other | Admitting: Vascular Surgery

## 2018-05-13 ENCOUNTER — Telehealth: Payer: Self-pay

## 2018-05-13 ENCOUNTER — Other Ambulatory Visit: Payer: Self-pay

## 2018-05-13 ENCOUNTER — Ambulatory Visit: Payer: Medicare Other | Admitting: Internal Medicine

## 2018-05-13 NOTE — Telephone Encounter (Signed)
Lab results have not been received.

## 2018-05-13 NOTE — Telephone Encounter (Signed)
Nurse Caryl Pina) at [facility] called office. Informed her Dr. Gala Romney requested pt have face-to-face w/AB. Caryl Pina stated pt has had problems with ammonia level. She will fax lab results. States she is taking Lactulose. Fax number given to [place]. AB informed. Awaiting fax from nursing home.  FYI: appt note for today's telephone visit was for Hep C.

## 2018-05-13 NOTE — Telephone Encounter (Signed)
Pt was scheduled for telephone visit with Dr. Gala Romney today. Pt is currently in nursing home at [facility] in [place]. Dr. Gala Romney advised pt isn't a candidate for telephone visit and will need to be rescheduled for a face-to-face visit with AB at a later date.  Erline Levine, please schedule face-to-face visit with AB at a later date.

## 2018-05-14 ENCOUNTER — Encounter: Payer: Self-pay | Admitting: Internal Medicine

## 2018-05-14 NOTE — Telephone Encounter (Signed)
SCHEDULED PATIENT FOR APPOINTMENT AND MAILED LETTER

## 2018-05-15 NOTE — Telephone Encounter (Signed)
AB, FYI lab results not received.

## 2018-05-20 ENCOUNTER — Encounter: Payer: Self-pay | Admitting: Vascular Surgery

## 2018-05-20 ENCOUNTER — Other Ambulatory Visit: Payer: Self-pay

## 2018-05-20 ENCOUNTER — Ambulatory Visit (INDEPENDENT_AMBULATORY_CARE_PROVIDER_SITE_OTHER): Payer: Self-pay | Admitting: Vascular Surgery

## 2018-05-20 VITALS — BP 131/81 | HR 78 | Temp 97.9°F | Resp 18 | Ht 60.0 in | Wt 134.0 lb

## 2018-05-20 DIAGNOSIS — Z9889 Other specified postprocedural states: Secondary | ICD-10-CM

## 2018-05-20 NOTE — Progress Notes (Signed)
Patient name: Cassandra Jordan MRN: [id number] DOB: Dec 19, 1950 Sex: female  REASON FOR VISIT: Follow-up right carotid endarterectomy from 04/04/2018  HPI: Cassandra Jordan is a 68 y.o. female here today for follow-up.  She had had a significant preoperative stroke.  Underwent uneventful endarterectomy and was discharged to a nursing facility. He is here today for follow-up.  She reports that she is to return to home but that her daughter is not very supportive of this.  He is in a wheelchair but is able to stand to walk.  Does have some continued left arm weakness as well Current Outpatient Medications  Medication Sig Dispense Refill  . amLODipine (NORVASC) 10 MG tablet Take 1 tablet (10 mg total) by mouth daily. 30 tablet 1  . atorvastatin (LIPITOR) 40 MG tablet Take 40 mg by mouth at bedtime.    . carvedilol (COREG) 6.25 MG tablet Take 6.25 mg by mouth 2 (two) times daily.  0  . cholestyramine (QUESTRAN) 4 g packet Take 4 g by mouth 2 (two) times daily.    . clopidogrel (PLAVIX) 75 MG tablet Take 1 tablet (75 mg total) by mouth daily with breakfast.    . famotidine (PEPCID) 20 MG tablet Take 20 mg by mouth daily.    . fenofibrate (TRICOR) 145 MG tablet Take 145 mg by mouth daily.    . haloperidol (HALDOL) 2 MG tablet Take 2 mg by mouth 3 (three) times daily.    . QUEtiapine (SEROQUEL) 200 MG tablet Take 200 mg by mouth at bedtime.    Marland Kitchen QUEtiapine (SEROQUEL) 300 MG tablet Take 300 mg by mouth at bedtime.    . rifaximin (XIFAXAN) 550 MG TABS tablet Take 1 tablet (550 mg total) by mouth 2 (two) times daily. 60 tablet 0  . acetaminophen (TYLENOL) 325 MG tablet Take 325 mg by mouth 2 (two) times daily as needed (pain.).    Marland Kitchen aspirin 325 MG tablet Take 1 tablet (325 mg total) by mouth daily. (Patient not taking: Reported on 05/20/2018)    . lactulose (CHRONULAC) 10 GM/15ML solution Take 30 mLs (20 g total) by mouth 2 (two) times daily. (Patient not taking: Reported  on 05/20/2018) 946 mL 1  . magnesium hydroxide (MILK OF MAGNESIA) 400 MG/5ML suspension Take 30 mLs by mouth daily as needed for mild constipation.    Marland Kitchen oxyCODONE-acetaminophen (PERCOCET/ROXICET) 5-325 MG tablet Take 1 tablet by mouth every 6 (six) hours as needed for moderate pain. (Patient not taking: Reported on 05/20/2018) 15 tablet 0  . Zinc Oxide 24 % CREA Apply 1 application topically every 2 (two) hours as needed (applied to sacrum and perineal areas for excoriation).     No current facility-administered medications for this visit.      PHYSICAL EXAM: Vitals:   05/20/18 1037 05/20/18 1040  BP: 112/80 131/81  Pulse: 78   Resp: 18   Temp: 97.9 F (36.6 C)   SpO2: 97%   Weight: 134 lb (60.8 kg)   Height: 5' (1.524 m)     GENERAL: The patient is a well-nourished female, in no acute distress. The vital signs are documented above. Right neck incision is well-healed with no tenderness and no erythema Neurologically at her baseline with left-sided weakness  MEDICAL ISSUES: Stable status post right carotid endarterectomy with significant preoperative stroke.  We will continue her usual activities.  We will see her in 9 months with repeat carotid duplex.  She will notify should she develop any wound issues and will  report any new neurologic deficits   Rosetta Posner, MD [facility] Tel [phone] Pager [phone]

## 2018-06-03 NOTE — Telephone Encounter (Signed)
Ashley from [facility] called. Patient had ammonia redrawn again today and it was 233. The NP wants patient to be seen sooner than July. We can call [phone] and ask for [address] or kelly to schedule.

## 2018-06-03 NOTE — Telephone Encounter (Signed)
JL spoke with EG and can bring patient in for OV tomorrow.

## 2018-06-03 NOTE — Telephone Encounter (Signed)
Spoke with ashley at [facility]. Could not bring patient in the AM. Per EG okay to use his 3:30pm slot. appt scheduled. Caryl Pina provided with fax # to send over labs

## 2018-06-04 ENCOUNTER — Other Ambulatory Visit: Payer: Self-pay

## 2018-06-04 ENCOUNTER — Encounter: Payer: Self-pay | Admitting: Nurse Practitioner

## 2018-06-04 ENCOUNTER — Ambulatory Visit (INDEPENDENT_AMBULATORY_CARE_PROVIDER_SITE_OTHER): Payer: Medicare Other | Admitting: Nurse Practitioner

## 2018-06-04 VITALS — BP 156/85 | HR 83 | Temp 96.7°F | Ht 60.0 in | Wt 133.0 lb

## 2018-06-04 DIAGNOSIS — K7682 Hepatic encephalopathy: Secondary | ICD-10-CM

## 2018-06-04 DIAGNOSIS — R7989 Other specified abnormal findings of blood chemistry: Secondary | ICD-10-CM

## 2018-06-04 DIAGNOSIS — K7460 Unspecified cirrhosis of liver: Secondary | ICD-10-CM | POA: Diagnosis not present

## 2018-06-04 DIAGNOSIS — R945 Abnormal results of liver function studies: Secondary | ICD-10-CM | POA: Diagnosis not present

## 2018-06-04 DIAGNOSIS — K7290 Hepatic failure, unspecified without coma: Secondary | ICD-10-CM | POA: Diagnosis not present

## 2018-06-04 DIAGNOSIS — B182 Chronic viral hepatitis C: Secondary | ICD-10-CM | POA: Diagnosis not present

## 2018-06-04 NOTE — Progress Notes (Signed)
Referring Provider: Abran Richard, MD Primary Care Physician:  Abran Richard, MD Primary GI:  Dr. Gala Romney  Chief Complaint  Patient presents with  . abnormal labs    HPI:   Cassandra Jordan is a 68 y.o. female who presents from the nursing home for evaluation.  The patient was last seen in our office 07/17/2016 for chronic hepatitis C, history of drug use.  At that time it was noted the patient has a history of EtOH/HCV cirrhosis, noncompliant with the EtOH and drug abstinence historically.  Was needing hepatitis C treatment (genotype 1b) but not a candidate while still using illicit drugs.  Due for screening EGD in 2018.  Completed hepatitis B series.  At her previous visit she was still using cocaine.  At her last visit she indicated she used cocaine just a week prior, wants referral to rehab as an outpatient.  She was again noted that she could not be treated for hepatitis C until she completes rehab.  No abdominal pain or nausea and vomiting.  Recommended drug screen in 4 weeks and if negative, would pursue scheduling an upper endoscopy.  If positive there was potential she could be discharged from the practice.  Also recommended ultrasound of the liver due to cirrhosis.  The patient called our office indicating she wanted to hold off on drug screening until end of July 2018.  This is never completed and she has been lost to follow-up since.  Since we last saw her she has had admissions for small bowel obstruction, abdominal pain requiring laparoscopic cholecystectomy, acute ischemic stroke in 2019 she also has PSVT and seen by cardiology.  The patient was initially scheduled for telephone visit yesterday with Dr. Gala Romney, but she was not an appropriate patient for virtual office visit.  Recommended in person visit.  This visit was requested by the nursing home due to elevated ammonia.  Patient had this redrawn again and it was 233.  She was subsequently worked in for an office visit.   Reviewed recent labs.  On 05/13/2018 the patient had an ammonia level elevated at 132.  CMP the same day found normal creatinine at 1.17, AST/ALT elevated at 264/100, alkaline phosphatase elevated at 311, bilirubin normal at 0.5.  Most recent labs drawn 06/03/2018 found ammonia level elevated at 233, CMP with stable/mildly elevated AST/ALT compared to previous lab draw measuring 338/169, elevated but improved alkaline phosphatase at 255, total bilirubin mildly elevated at 1.2.  Today she states she's doing ok. States she's been at [facility] for 3 weeks (admission date indicates 01/2018). States she was admitted due to memory problems and "doing strange things." Denies abdominal pain, N/V, hematochezia, melena, fever, chills, unintentional weight loss. Denies URI and flu-like symptoms. Denies loss of sense of taste or smell. Denies reports of recent confusion. Denies yellowing of skin/eyes, darkened urine, tremors, abdominal swelling. Feels she's had LE edema. Denies chest pain, dyspnea, dizziness, lightheadedness, syncope, near syncope. Denies any other upper or lower GI symptoms.  States she's receiving lactulose 4 times a day and having about 4 bowel movements a day. Denies constipation.  Past Medical History:  Diagnosis Date  . Alcohol abuse   . Arthritis   . Asthma   . Carotid stenosis   . Cocaine abuse ([place])   . Essential hypertension   . GERD (gastroesophageal reflux disease)   . Hepatitis C   . Hyperlipidemia   . Lichen sclerosus et atrophicus 10/13/2014  . Neuropathic pain   . Noncompliance   .  PSVT (paroxysmal supraventricular tachycardia) ([place])   . Rash   . Renal carcinoma ([place])   . Sleep apnea   . Type 2 diabetes mellitus ([place])   . Vaginal irritation 10/01/2014  . Vaginal itching 10/01/2014  . Yeast infection of the vagina 10/01/2014    Past Surgical History:  Procedure Laterality Date  . ABDOMINAL HYSTERECTOMY    . CAROTID ENDARTERECTOMY Right 04/04/2018  . COLONOSCOPY N/A  07/08/2013   RMR: Multiple colonic polyps treated [name] as described above. Inadequate prepartation comprimised examination there colonic diverticulosis. tubular and tubulovillous adenomas. next TCS 12/2013  . COLONOSCOPY N/A 02/09/2015   RMR: Colonic polyp removed as described above. Tubular adenoma. Next colonoscopy January 2022.  Marland Kitchen ENDARTERECTOMY Right 04/04/2018   Procedure: RIGHT ENDARTERECTOMY CAROTID;  Surgeon: Rosetta Posner, MD;  Location: [facility] OR;  Service: Vascular;  Laterality: Right;  . ESOPHAGOGASTRODUODENOSCOPY N/A 07/08/2013   RMR: Schatzki's ring ; small hiatal hernia-status post passage of a Maloney dilator  . MALONEY DILATION N/A 07/08/2013   Procedure: [name] DILATION;  Surgeon: [name], MD;  Location: [facility] END[facility];  Service: Endoscopy;  Laterality: N/A;  . PATCH ANGIOPLASTY Right 04/04/2018   Procedure: PATCH ANGIOPLAST USING HEMASHIELD PLATINUM FINESSE;  Surgeon: Rosetta Posner, MD;  Location: [place];  Service: Vascular;  Laterality: Right;  . ROBOT ASSISTED LAPAROSCOPIC NEPHRECTOMY Right 10/14/2013   Procedure: ROBOTIC ASSISTED LAPAROSCOPIC RADICAL NEPHRECTOMY;  Surgeon: [name], MD;  Location: [facility] ORS;  Service: Urology;  Laterality: Right;  . SAVORY DILATION N/A 07/08/2013   Procedure: SAVORY DILATION;  Surgeon: [name], MD;  Location: [facility];  Service: Endoscopy;  Laterality: N/A;  . TONSILLECTOMY    . Wart removal      Current Outpatient Medications  Medication Sig Dispense Refill  . amLODipine (NORVASC) 10 MG tablet Take 1 tablet (10 mg total) by mouth daily. 30 tablet 1  . aspirin 325 MG tablet Take 1 tablet (325 mg total) by mouth daily.    Marland Kitchen atorvastatin (LIPITOR) 10 MG tablet Take 10 mg by mouth daily.    . carvedilol (COREG) 6.25 MG tablet Take 6.25 mg by mouth 2 (two) times daily.  0  . cetirizine (ZYRTEC) 5 MG tablet Take 5 mg by mouth as needed for allergies.    Marland Kitchen clopidogrel (PLAVIX) 75 MG tablet Take 1 tablet (75 mg total) by mouth  daily with breakfast.    . famotidine (PEPCID) 20 MG tablet Take 20 mg by mouth daily.    . fenofibrate (TRICOR) 145 MG tablet Take 145 mg by mouth daily.    . haloperidol (HALDOL) 2 MG tablet Take 2 mg by mouth 3 (three) times daily.    Marland Kitchen lactulose (CHRONULAC) 10 GM/15ML solution Take 30 mLs (20 g total) by mouth 2 (two) times daily. (Patient taking differently: Take 40 g by mouth 4 (four) times daily. 20m 4 times a day) 946 mL 1  . Menthol-Zinc Oxide (CALMOSEPTINE) 0.44-20.6 % OINT Apply topically as needed. Apply to perineal area topically as need for after all adl care/skin integrity    . QUEtiapine (SEROQUEL) 200 MG tablet Take 200 mg by mouth at bedtime.    .Marland KitchenQUEtiapine (SEROQUEL) 300 MG tablet Take 300 mg by mouth at bedtime.    . rifaximin (XIFAXAN) 550 MG TABS tablet Take 1 tablet (550 mg total) by mouth 2 (two) times daily. 60 tablet 0  . sertraline (ZOLOFT) 25 MG tablet Take 25 mg by mouth daily.    .Marland Kitchen  acetaminophen (TYLENOL) 325 MG tablet Take 325 mg by mouth 2 (two) times daily as needed (pain.).    Marland Kitchen atorvastatin (LIPITOR) 40 MG tablet Take 40 mg by mouth at bedtime.    . cholestyramine (QUESTRAN) 4 g packet Take 4 g by mouth 2 (two) times daily.    . magnesium hydroxide (MILK OF MAGNESIA) 400 MG/5ML suspension Take 30 mLs by mouth daily as needed for mild constipation.    Marland Kitchen oxyCODONE-acetaminophen (PERCOCET/ROXICET) 5-325 MG tablet Take 1 tablet by mouth every 6 (six) hours as needed for moderate pain. (Patient not taking: Reported on 05/20/2018) 15 tablet 0  . Zinc Oxide 24 % CREA Apply 1 application topically every 2 (two) hours as needed (applied to sacrum and perineal areas for excoriation).     No current facility-administered medications for this visit.     Allergies as of 06/04/2018 - Review Complete 06/04/2018  Allergen Reaction Noted  . Sulfa antibiotics Itching and Other (See Comments) 04/10/2012    Family History  Problem Relation Age of Onset  . Colon cancer  Sister 35  . Cancer Mother   . Heart attack Father   . Hypertension Daughter   . Cancer Maternal Grandfather   . Diabetes Paternal Grandmother     Social History   Socioeconomic History  . Marital status: Single    Spouse name: Not on file  . Number of children: Not on file  . Years of education: Not on file  . Highest education level: Not on file  Occupational History  . Not on file  Social Needs  . Financial resource strain: Not on file  . Food insecurity:    Worry: Not on file    Inability: Not on file  . Transportation needs:    Medical: Not on file    Non-medical: Not on file  Tobacco Use  . Smoking status: Former Smoker    Packs/day: 0.10    Years: 40.00    Pack years: 4.00    Types: Cigarettes    Last attempt to quit: 03/07/2018    Years since quitting: 0.2  . Smokeless tobacco: Never Used  Substance and Sexual Activity  . Alcohol use: Not Currently    Alcohol/week: 0.0 standard drinks    Comment: rarely  . Drug use: Not Currently    Types: Marijuana, Cocaine  . Sexual activity: Not Currently    Birth control/protection: Surgical    Comment: hyst  Lifestyle  . Physical activity:    Days per week: Not on file    Minutes per session: Not on file  . Stress: Not on file  Relationships  . Social connections:    Talks on phone: Not on file    Gets together: Not on file    Attends religious service: Not on file    Active member of club or organization: Not on file    Attends meetings of clubs or organizations: Not on file    Relationship status: Not on file  Other Topics Concern  . Not on file  Social History Narrative  . Not on file    Review of Systems: General: Negative for anorexia, weight loss, fever, chills, fatigue, weakness. ENT: Negative for hoarseness, difficulty swallowing. CV: Negative for chest pain, angina, palpitations, peripheral edema.  Respiratory: Negative for dyspnea at rest, cough, sputum, wheezing.  GI: See history of present  illness. Derm: Negative for rash or itching.  Neuro: Negative for memory loss, confusion.  Psych: Negative for anxiety, depression, suicidal ideation,  hallucinations.  Endo: Negative for unusual weight change.  Heme: Negative for bruising or bleeding. Allergy: Negative for rash or hives.   Physical Exam: BP (!) 156/85   Pulse 83   Temp (!) 96.7 F (35.9 C) (Oral)   Ht 5' (1.524 m)   Wt 133 lb (60.3 kg)   BMI 25.97 kg/m  General:   Alert and oriented. Pleasant and cooperative. Well-nourished and well-developed.  Head:  Normocephalic and atraumatic. Eyes:  Without icterus, sclera clear and conjunctiva pink.  Ears:  Normal auditory acuity. Mouth:  No deformity or lesions, oral mucosa pink.  Throat/Neck:  Supple, without mass or thyromegaly. Cardiovascular:  S1, S2 present without murmurs appreciated. Extremities without clubbing. Mild non-pitting bilateral LE edema noted. Respiratory:  Clear to auscultation bilaterally. No wheezes, rales, or rhonchi. No distress.  Gastrointestinal:  +BS, soft, non-tender and non-distended. No HSM noted. No guarding or rebound. No masses appreciated.  Rectal:  Deferred  Musculoskalatal:  Symmetrical without gross deformities. Neurologic:  Alert and oriented x4;  grossly normal neurologically. Psych:  Alert and cooperative. Normal mood and affect. Heme/Lymph/Immune: No excessive bruising noted.    06/05/2018 3:59 PM   Disclaimer: This note was dictated with voice recognition software. Similar sounding words can inadvertently be transcribed and may not be corrected upon review.

## 2018-06-04 NOTE — Patient Instructions (Signed)
Your health issues we discussed today were:   Hepatitis C, cirrhosis, liver related confusion, abnormal liver tests: 1. I have ordered several labs.  Have this checked as soon as possible 2. Have the results faxed to our office at [phone] 3. I have also ordered an ultrasound of the liver.  Have this completed as soon as possible and fax results to our office 4. We will review these results 5. Continue taking lactulose (titrate for 3-4 bowel movements a day) and Xifaxan 6. There is no need for ongoing checking of ammonia. 7. Return to our office in 2 months to consider scheduling an upper endoscopy and/or consider treatment of hepatitis C 8. Call if any worsening or severe symptoms  Overall I recommend:  1. Continue other medications otherwise 2. Return for follow-up in 2 months 3. Call us if you have any questions or concerns.   Because of recent events of COVID-19 ("Coronavirus"), follow CDC recommendations:  1. Wash your hand frequently 2. Avoid touching your face 3. Stay away from people who are sick 4. If you have symptoms such as fever, cough, shortness of breath then call your healthcare provider for further guidance 5. If you are sick, STAY AT HOME unless otherwise directed by your healthcare provider. 6. Follow directions from state and national officials regarding staying safe   At [facility] we value your feedback. You may receive a survey about your visit today. Please share your experience as we strive to create trusting relationships with our patients to provide genuine, compassionate, quality care.  We appreciate your understanding and patience as we review any laboratory studies, imaging, and other diagnostic tests that are ordered as we care for you. Our office policy is 5 business days for review of these results, and any emergent or urgent results are addressed in a timely manner for your best interest. If you do not hear from our office in 1  week, please contact us.   We also encourage the use of MyChart, which contains your medical information for your review as well. If you are not enrolled in this feature, an access code is on this after visit summary for your convenience. Thank you for allowing [place] to be involved in your care.  It was great to see you today!  I hope you have a great day!!

## 2018-06-05 ENCOUNTER — Encounter: Payer: Self-pay | Admitting: Internal Medicine

## 2018-06-05 ENCOUNTER — Encounter: Payer: Self-pay | Admitting: Nurse Practitioner

## 2018-06-05 NOTE — Assessment & Plan Note (Signed)
The patient has hepatitis C and known cirrhosis, likely multifactorial in nature.  There is previous discussions on treating her hepatitis C however this was held in 2018 due to the patient's continued use of cocaine.  She is currently living at the [facility] and [place], [place].  Per my discussion with the nursing staff there she is admitted as a long-term patient.  The patient has not had any alcohol or cocaine in "a long time" per her admission.  I feel that long-term care is likely the best option for this patient.  I will check a urine drug screen.  We will need to update her hepatitis C labs including hepatitis C RNA and genotype, HIV.  If she will remain long-term care and we can count on regular medication dosing this opens up the possibility of not treating her hepatitis C.  We will have her follow-up in 2 months at which point we can further discuss her options.

## 2018-06-05 NOTE — Assessment & Plan Note (Signed)
The patient has no cirrhosis but has been lost to follow-up since 2018.  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  There appears to be some decompensation in her liver function, as per below.  She is due for updated imaging and labs.  I will check a right upper quadrant ultrasound, HFP, BMP, INR, AFP.  Further labs as per below based on elevated LFTs and decompensation.  Return for follow-up in 2 months at which point we can discuss possibility of hepatitis C treatment and/or EGD as previously planned in 2018 but delayed due to continued cocaine use.

## 2018-06-05 NOTE — Assessment & Plan Note (Addendum)
Labs provided by the facility indicate elevated LFTs including ammonia level of 233, AST/ALT at 338/169, alkaline phosphatase of 255, bilirubin at 1.2.  This is likely multifactorial in nature.  Doubt alcoholic hepatitis or active damage from cocaine due to her admission to a long-term care facility.  She does still have active hepatitis C.  I will recheck her labs including a CBC, HFP, BMP, INR, AFP.  This will allow [place] to get a better sense of her liver function including meld and child Pugh calculation.  I feel that she would likely have some improvement in her labs if we were able to treat her hepatitis C, which is much more a possibility now that she is admitted to long-term care and no longer using cocaine or drinking.  We will need to follow her labs closely to ensure no significant decompensation.  She is also due for EGD, which was previously held due to her active cocaine use.  Follow-up in 2 months to further discuss hepatitis C treatment and arrange for upper endoscopy for variceal screening.  Patient return for lab results and abdominal ultrasound we can take further steps for evaluation of elevated transaminases with elevated alkaline phosphatase and upper limit normal, but still normal, bilirubin.

## 2018-06-05 NOTE — Assessment & Plan Note (Signed)
History of hepatic encephalopathy.  When the patient was referred to [place] there was concerns about an elevated ammonia level at 233.  The patient seemed cognizant and without active encephalopathy, based on my limited discussion with her.  However, when I spoke to the nursing staff they noted that she is intermittently with confusion, depending on her dosing.  She is currently on lactulose 4 times a day and Xifaxan twice a day.  Because the lactulose causes her to have significant bowel movements she will at times refused to take it which tends to result and brief confusion.  At this point there is no further medication options we can offer for hepatic encephalopathy.  I encouraged the patient to take her medications as recommended to help prevent confusion.  If she becomes significantly encephalopathic and/or obtunded she would likely need to be admitted.  Return for follow-up in 2 months.

## 2018-06-09 ENCOUNTER — Telehealth: Payer: Self-pay

## 2018-06-09 NOTE — Telephone Encounter (Signed)
Received a call from the nursing home pt resides at. Pt saw EG on 06/04/18 and was asked to have labs drawn and drug screening. The nurses at the nursing home are having trouble drawing pts blood since she is a hard stick and they would like to know if they can d/c the order for the drug screening due to the pt living at the nursing facility for 1 year.   Spoke with EG and it's ok to d/c the order for the drug screening.

## 2018-06-09 NOTE — Progress Notes (Signed)
CC'D TO PCP °

## 2018-06-10 ENCOUNTER — Telehealth: Payer: Self-pay | Admitting: Cardiology

## 2018-06-10 NOTE — Telephone Encounter (Signed)
Virtual Visit Pre-Appointment Phone Call  "(Name), I am calling you today to discuss your upcoming appointment. We are currently trying to limit exposure to the virus that causes COVID-19 by seeing patients at home rather than in the office."  1. "What is the BEST phone number to call the day of the visit?" - include this in appointment notes  2. Do you have or have access to (through a family member/friend) a smartphone with video capability that we can use for your visit?" a. If yes - list this number in appt notes as cell (if different from BEST phone #) and list the appointment type as a VIDEO visit in appointment notes b. If no - list the appointment type as a PHONE visit in appointment notes  3. Confirm consent - "In the setting of the current Covid19 crisis, you are scheduled for a (phone or video) visit with your provider on (date) at (time).  Just as we do with many in-office visits, in order for you to participate in this visit, we must obtain consent.  If you'd like, I can send this to your mychart (if signed up) or email for you to review.  Otherwise, I can obtain your verbal consent now.  All virtual visits are billed to your insurance company just like a normal visit would be.  By agreeing to a virtual visit, we'd like you to understand that the technology does not allow for your provider to perform an examination, and thus may limit your provider's ability to fully assess your condition. If your provider identifies any concerns that need to be evaluated in person, we will make arrangements to do so.  Finally, though the technology is pretty good, we cannot assure that it will always work on either your or our end, and in the setting of a video visit, we may have to convert it to a phone-only visit.  In either situation, we cannot ensure that we have a secure connection.  Are you willing to proceed?" STAFF: Did the patient verbally acknowledge consent to telehealth visit? Document  YES/NO here: Yes  4. Advise patient to be prepared - "Two hours prior to your appointment, go ahead and check your blood pressure, pulse, oxygen saturation, and your weight (if you have the equipment to check those) and write them all down. When your visit starts, your provider will ask you for this information. If you have an Apple Watch or Kardia device, please plan to have heart rate information ready on the day of your appointment. Please have a pen and paper handy nearby the day of the visit as well."  5. Give patient instructions for MyChart download to smartphone OR [email or web address] as below if video visit (depending on what platform provider is using)  6. Inform patient they will receive a phone call 15 minutes prior to their appointment time (may be from unknown caller ID) so they should be prepared to answer    TELEPHONE CALL NOTE  Cassandra Jordan has been deemed a candidate for a follow-up tele-health visit to limit community exposure during the Covid-19 pandemic. I spoke with the patient via phone to ensure availability of phone/video source, confirm preferred email & phone number, and discuss instructions and expectations.  I reminded Cassandra Jordan to be prepared with any vital sign and/or heart rhythm information that could potentially be obtained via home monitoring, at the time of her visit. I reminded Cassandra Jordan to expect a phone call prior to  her visit.  Cassandra Jordan 06/10/2018 11:03 AM

## 2018-06-12 ENCOUNTER — Telehealth: Payer: Self-pay | Admitting: Nurse Practitioner

## 2018-06-12 IMAGING — CT CT ANGIO CHEST
2 of 6 series · 19 of 46 positions shown · IV contrast (Isovue)
Comparison: None.

CLINICAL DATA: Elevated D-dimer.  Chest pain short of breath.

EXAM:
CT ANGIOGRAPHY CHEST WITH CONTRAST
TECHNIQUE: Multidetector CT imaging of the chest was performed using the
standard protocol during bolus administration of intravenous
contrast. Multiplanar CT image reconstructions and MIPs were
obtained to evaluate the vascular anatomy.
CONTRAST:  100 mL Isovue

[Series 6: thins · axial · 0.69mm/px · z∈[+1128,+1358]mm · 16 of 252 slices shown]
[im 11/252  lung]
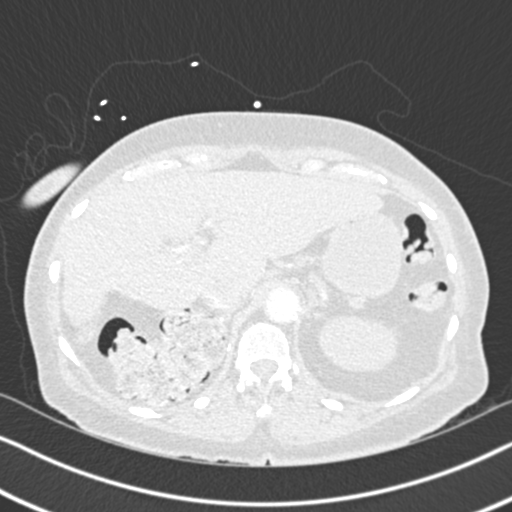
[im 33/252  soft-tissue]
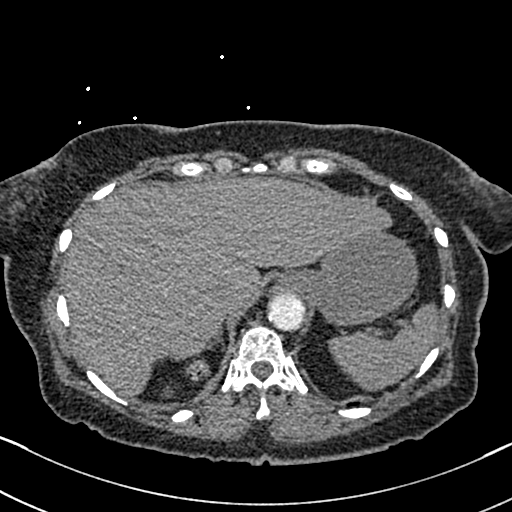
[im 44/252  lung]
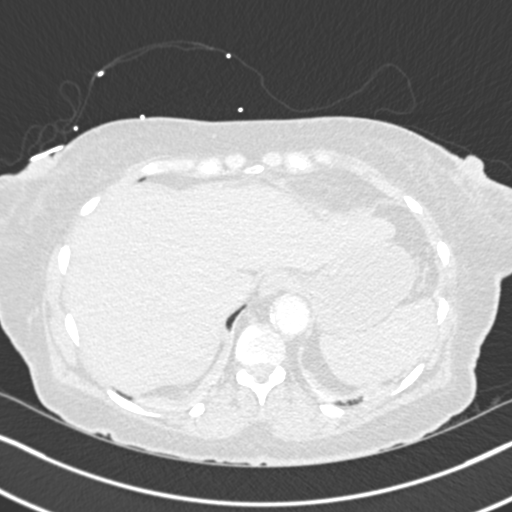
[im 55/252  soft-tissue]
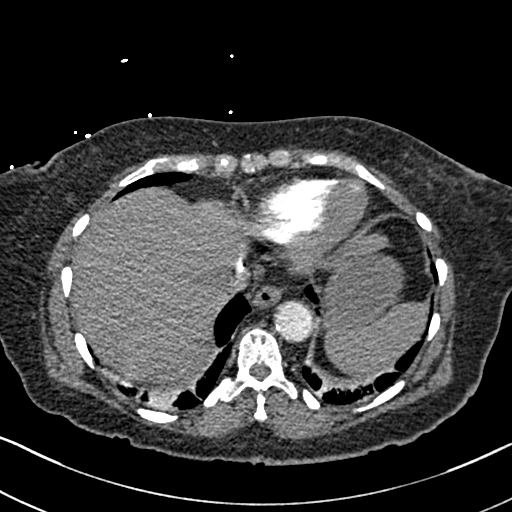
[im 77/252  lung]
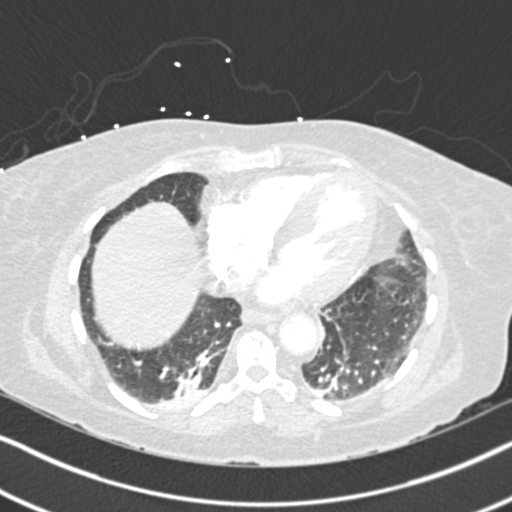
[im 88/252  soft-tissue]
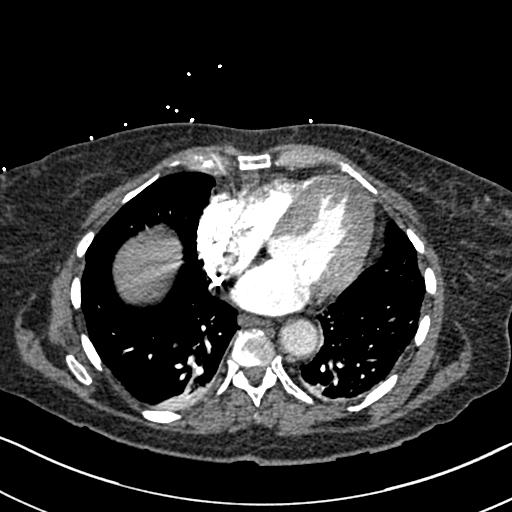
[im 99/252  lung]
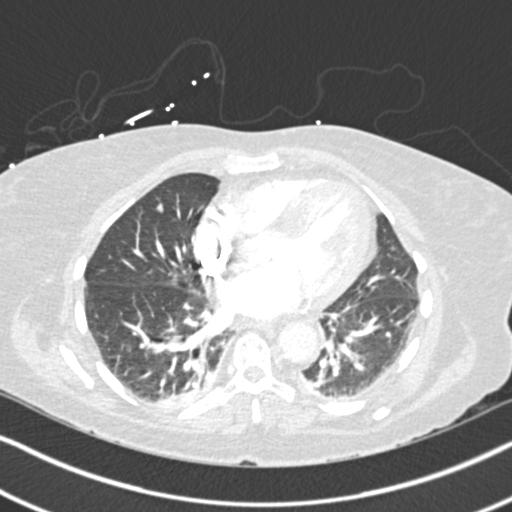
[im 121/252  soft-tissue]
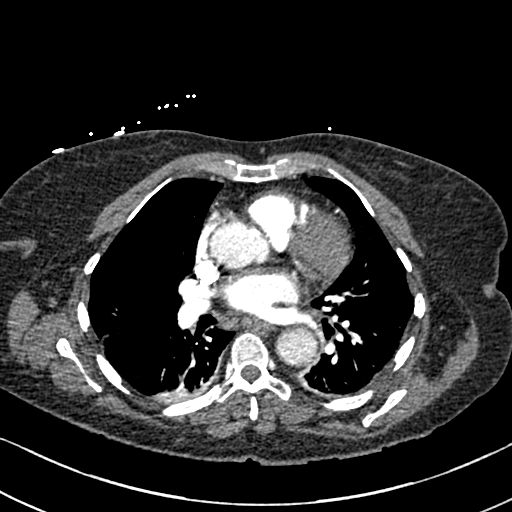
[im 131/252  lung]
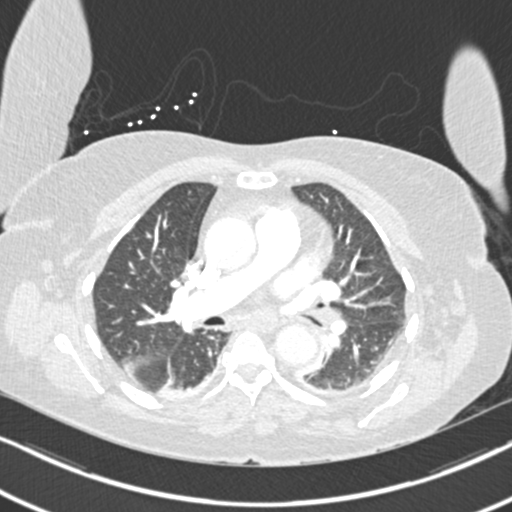
[im 153/252  soft-tissue]
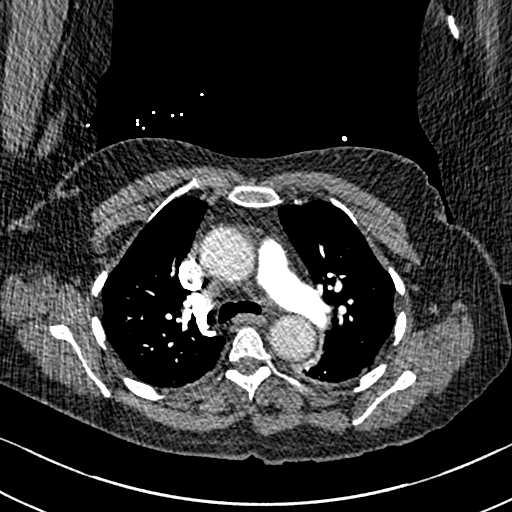
[im 164/252  lung]
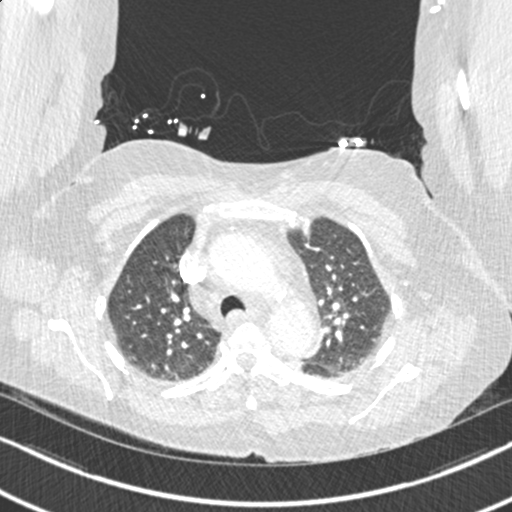
[im 175/252  soft-tissue]
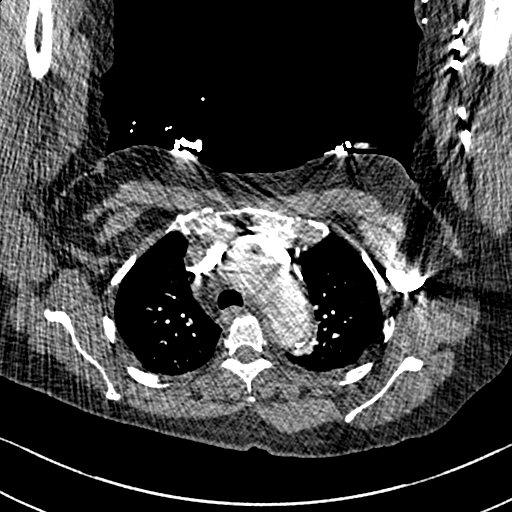
[im 197/252  lung]
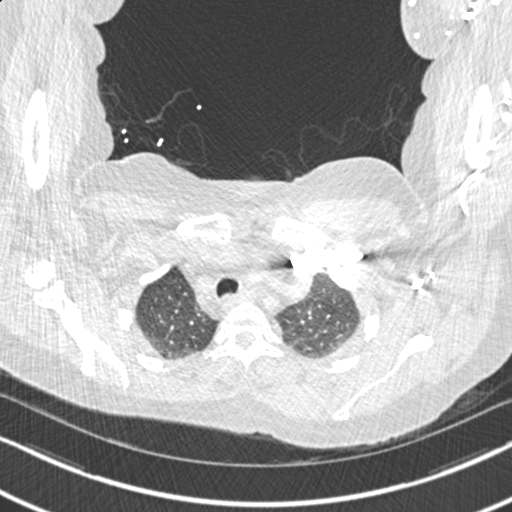
[im 208/252  soft-tissue]
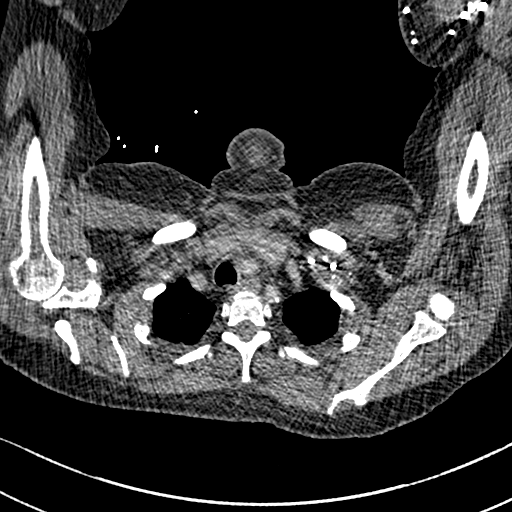
[im 219/252  lung]
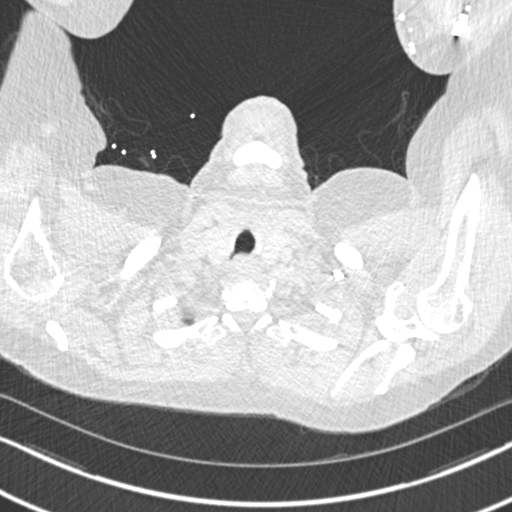
[im 241/252  soft-tissue]
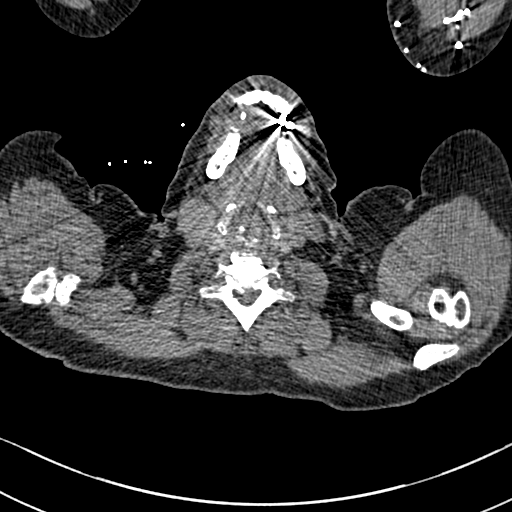

[Series 8: coronal mpr · coronal · 0.59mm/px · 3 of 117 slices shown]
[im 30/117  soft-tissue]
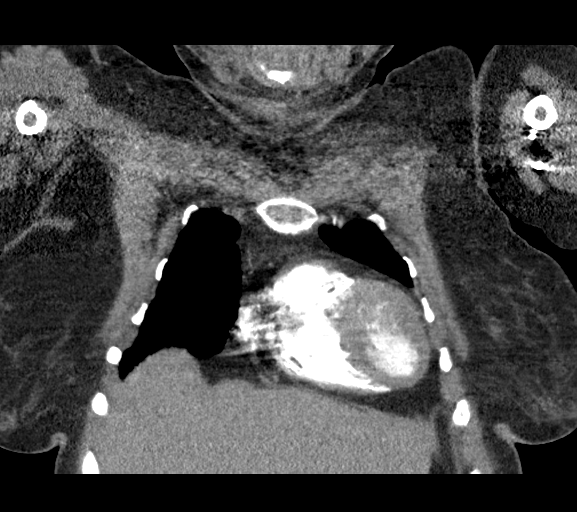
[im 59/117  soft-tissue]
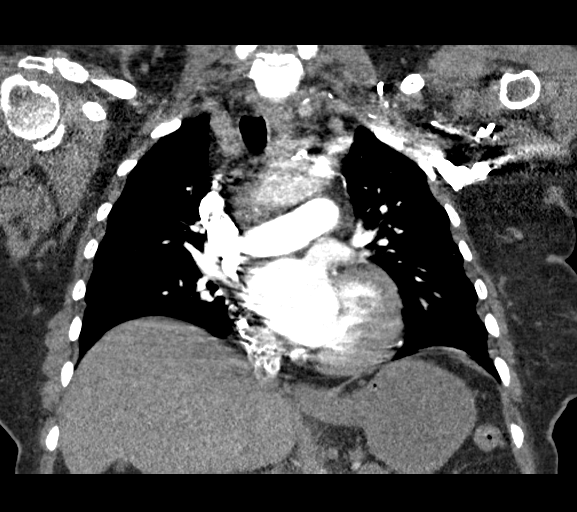
[im 88/117  soft-tissue]
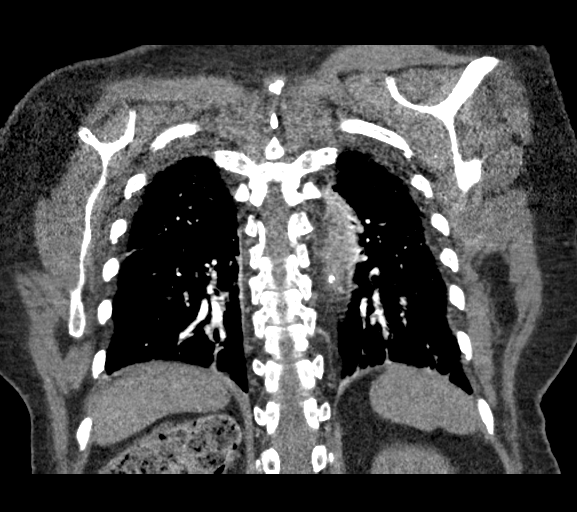

[19 of 46 positions shown; findings below may reference images not displayed]

FINDINGS: Cardiovascular: No filling defects within the pulmonary arteries to
suggest acute pulmonary embolism. No acute findings of the aorta or
great vessels. No pericardial fluid.

Mediastinum/Nodes: No axillary supraclavicular adenopathy. No
mediastinal hilar adenopathy. No pericardial fluid. Esophagus
normal.

Lungs/Pleura: Bibasilar atelectasis small bowel. Focal infiltrate.
No pulmonary edema. No pulmonary infarction

Small nodule in the RIGHT middle lobe measures 6 mm (image 77,
series 7).

Upper Abdomen: Elevation RIGHT hemidiaphragm.

Musculoskeletal: No aggressive osseous lesion.

Review of the MIP images confirms the above findings.
IMPRESSION: 1. No acute pulmonary embolism.
2. Mild basilar atelectasis.
3. Small RIGHT middle lobe nodule. No follow-up needed if patient is
low-risk. Non-contrast chest CT can be considered in 12 months if
patient is high-risk. This recommendation follows the consensus
statement: Guidelines for Management of Incidental Pulmonary Nodules
Detected on CT Images: From the [HOSPITAL] 3417; Radiology

## 2018-06-12 NOTE — Telephone Encounter (Signed)
Received labs from patient nursing home. INR normal at 1.1. LFTs stable/somewhat improved with improvement in AST/ALT to 116/78, Alk phos improved to 225. Bili somewhat worse at 1.4. History of cirrhosis with cocaine use history, has Hep C (treatment held previously due to active cocaine).  More concerning is mild decline in hgb to 10.8, due for EGD. BUN elevated at 35.7 but Cr elevated at 2.53.  WBC count 24.8  Attempted to call [facility] ([address]) to ensure primary MD aware; no answer x 3.  Will CC: AB for follow-up on Tuesday due to I am out of town.

## 2018-06-13 NOTE — Progress Notes (Deleted)
{Choose 1 Note Type (Telehealth Visit or Telephone Visit):[id number]}   Date:  06/13/2018   ID:  Cassandra Jordan, DOB 1951-01-06, MRN [id number]  {Patient Location:[phone]::"Home"} {Provider Location:[phone]::"Home"}  PCP:  Abran Richard, MD  Cardiologist:  Rozann Lesches, MD  Evaluation Performed:  {Choose Visit HCWC:[id number]::"OHYWVP-XT Visit"}  Chief Complaint:  ***  History of Present Illness:    Cassandra Jordan is a 68 y.o. female last seen in November 2019.  Patient has been residing at the [facility] per record review.  She has chronic hepatitis C with cirrhosis and history of hepatic encephalopathy, followed by gastroenterology.  She has had pneumonia and LFT elevations, is no longer on amiodarone.  Echocardiogram from December 2019 revealed LVEF 45 to 50%.  The patient {does/does not:200015} have symptoms concerning for COVID-19 infection (fever, chills, cough, or new shortness of breath).    Past Medical History:  Diagnosis Date  . Alcohol abuse   . Arthritis   . Asthma   . Carotid stenosis   . Cocaine abuse ([place])   . Essential hypertension   . GERD (gastroesophageal reflux disease)   . Hepatitis C   . Hyperlipidemia   . Lichen sclerosus et atrophicus 10/13/2014  . Neuropathic pain   . Noncompliance   . PSVT (paroxysmal supraventricular tachycardia) ([place])   . Rash   . Renal carcinoma ([place])   . Sleep apnea   . Type 2 diabetes mellitus ([place])   . Vaginal irritation 10/01/2014  . Vaginal itching 10/01/2014  . Yeast infection of the vagina 10/01/2014   Past Surgical History:  Procedure Laterality Date  . ABDOMINAL HYSTERECTOMY    . CAROTID ENDARTERECTOMY Right 04/04/2018  . COLONOSCOPY N/A 07/08/2013   RMR: Multiple colonic polyps treated [name] as described above. Inadequate prepartation comprimised examination there colonic diverticulosis. tubular and tubulovillous adenomas. next TCS 12/2013  . COLONOSCOPY N/A 02/09/2015   RMR: Colonic polyp removed  as described above. Tubular adenoma. Next colonoscopy January 2022.  Marland Kitchen ENDARTERECTOMY Right 04/04/2018   Procedure: RIGHT ENDARTERECTOMY CAROTID;  Surgeon: Rosetta Posner, MD;  Location: [facility] OR;  Service: Vascular;  Laterality: Right;  . ESOPHAGOGASTRODUODENOSCOPY N/A 07/08/2013   RMR: Schatzki's ring ; small hiatal hernia-status post passage of a Maloney dilator  . MALONEY DILATION N/A 07/08/2013   Procedure: [name] DILATION;  Surgeon: [name], MD;  Location[facility];  Service: Endoscopy;  Laterality: N/A;  . PATCH ANGIOPLASTY Right 04/04/2018   Procedure: PATCH ANGIOPLAST USING HEMASHIELD PLATINUM FINESSE;  Surgeon: Rosetta Posner, MD;  Location: [place];  Service: Vascular;  Laterality: Right;  . ROBOT ASSISTED LAPAROSCOPIC NEPHRECTOMY Right 10/14/2013   Procedure: ROBOTIC ASSISTED LAPAROSCOPIC RADICAL NEPHRECTOMY;  Surgeon: [name], MD;  Location: [facility] ORS;  Service: Urology;  Laterality: Right;  . SAVORY DILATION N/A 07/08/2013   Procedure: SAVORY DILATION;  Surgeon: [name], MD;  Location: [facility];  Service: Endoscopy;  Laterality: N/A;  . TONSILLECTOMY    . Wart removal       No outpatient medications have been marked as taking for the 06/17/18 encounter (Appointment) with Satira Sark, MD.     Allergies:   Sulfa antibiotics   Social History   Tobacco Use  . Smoking status: Former Smoker    Packs/day: 0.10    Years: 40.00    Pack years: 4.00    Types: Cigarettes    Last attempt to quit: 03/07/2018    Years since quitting: 0.2  . Smokeless tobacco: Never Used  Substance Use Topics  . Alcohol use: Not Currently    Alcohol/week: 0.0 standard drinks    Comment: rarely  . Drug use: Not Currently    Types: Marijuana, Cocaine     Family Hx: The patient's family history includes Cancer in her maternal grandfather and mother; Colon cancer (age of onset: 47) in her sister; Diabetes in her paternal grandmother; Heart attack in her father; Hypertension in  her daughter.  ROS:   Please see the history of present illness.    *** All other systems reviewed and are negative.   Prior CV studies:   The following studies were reviewed today:  Echocardiogram 12/24/2017: Study Conclusions  - Left ventricle: The cavity size was normal. Wall thickness was   increased in a pattern of mild LVH. Systolic function was mildly   reduced. The estimated ejection fraction was in the range of 45%   to 50%. There is hypokinesis of the mid-apicalanteroseptal and   apical myocardium. Features are consistent with a pseudonormal   left ventricular filling pattern, with concomitant abnormal   relaxation and increased filling pressure (grade 2 diastolic   dysfunction). - Aortic valve: Mildly to moderately calcified annulus. Probably   trileaflet; mildly calcified leaflets. - Mitral valve: Mildly calcified annulus. There was mild   regurgitation. - Right atrium: Central venous pressure (est): 3 mm Hg. - Atrial septum: No defect or patent foramen ovale was identified. - Tricuspid valve: There was trivial regurgitation. - Pulmonary arteries: PA peak pressure: 43 mm Hg (S). - Pericardium, extracardiac: There was no pericardial effusion.  Carotid Dopplers 12/23/2017: IMPRESSION: 1. Large amount of right-sided atherosclerotic plaque results in elevated peak systolic velocities within the right internal carotid artery compatible with the higher end of the 50-69% luminal narrowing range. Further evaluation with CTA could performed as clinically indicated. 2. Moderate amount of left-sided atherosclerotic plaque, not resulting in a hemodynamically significant stenosis.  Labs/Other Tests and Data Reviewed:    EKG:  An ECG dated 02/17/2018 was personally reviewed today and demonstrated:  Sinus rhythm with prolonged PR interval and nonspecific ST-T change with prolonged QTc.  Recent Labs: 02/16/2018: Magnesium 2.4; TSH 0.734 04/04/2018: ALT 50 04/05/2018: BUN 22;  Creatinine, Ser 1.59; Potassium 4.1; Sodium 136 04/06/2018: Hemoglobin 12.1; Platelets 309   Recent Lipid Panel Lab Results  Component Value Date/Time   CHOL 280 (H) 12/24/2017 04:01 AM   TRIG 163 (H) 12/24/2017 04:01 AM   HDL 52 12/24/2017 04:01 AM   CHOLHDL 5.4 12/24/2017 04:01 AM   LDLCALC 195 (H) 12/24/2017 04:01 AM    Wt Readings from Last 3 Encounters:  06/04/18 133 lb (60.3 kg)  05/20/18 134 lb (60.8 kg)  04/04/18 134 lb (60.8 kg)     Objective:    Vital Signs:  There were no vitals taken for this visit.   {[facility] Virtual Exam (Optional):[phone]::"VITAL SIGNS:  reviewed"}  ASSESSMENT & PLAN:    1. ***  COVID-19 Education: The signs and symptoms of COVID-19 were discussed with the patient and how to seek care for testing (follow up with PCP or arrange E-visit).  ***The importance of social distancing was discussed today.  Time:   Today, I have spent *** minutes with the patient with telehealth technology discussing the above problems.     Medication Adjustments/Labs and Tests Ordered: Current medicines are reviewed at length with the patient today.  Concerns regarding medicines are outlined above.   Tests Ordered: No orders of the defined types were placed in this encounter.  Medication Changes: No orders of the defined types were placed in this encounter.   Disposition:  Follow up {follow up:15908}  Signed, Rozann Lesches, MD  06/13/2018 12:40 PM    [place]

## 2018-06-17 ENCOUNTER — Encounter: Payer: Self-pay | Admitting: Cardiology

## 2018-06-17 ENCOUNTER — Telehealth: Payer: Self-pay

## 2018-06-17 ENCOUNTER — Telehealth: Payer: Medicare Other | Admitting: Cardiology

## 2018-06-17 NOTE — Telephone Encounter (Signed)
Noted  

## 2018-06-17 NOTE — Telephone Encounter (Signed)
[facility]ed to [facility], no answer

## 2018-06-17 NOTE — Telephone Encounter (Signed)
Will let [facility] triage and see what is going on.

## 2018-06-17 NOTE — Telephone Encounter (Signed)
T/c from [place] at the [facility] [phone]). She said on Friday the pt's Ammonia level was 540 and the provider tried some interventions and they were not successful. They have sent her out to the hospital and she thinks they are taking her to [facility], but she wasn't sure.  She has faxed over paperwork this morning also. I am forwarding the FYI to Roseanne Kaufman, NP in [place] absence and also to [place].

## 2018-06-17 NOTE — Telephone Encounter (Signed)
Caryl Pina [facility] the [facility] called back and stated patient is going to be admitted in [place]. She wants to know if AB wants her to try to get them to do the u/s while she is admitted?

## 2018-06-17 NOTE — Telephone Encounter (Signed)
Received a VM from 06/13/18. Caryl Pina from the [facility] called to inform our office that pts ammonia levels came back as 540. Pt's medication has been increased to 80 mg five times a day. I've tried to call the [facility] several times this morning with no answer to get the name of the medication that was increased. I will keep trying to reach them.  Pt needs a lot of assistance right now due to her levels being high. They would like to know if there are any other things our office would like to put in place at this time. Please advise in the absence of EG.

## 2018-06-17 NOTE — Telephone Encounter (Signed)
I called the [facility] to let them know.

## 2018-06-17 NOTE — Telephone Encounter (Signed)
Called central scheduling and u/s has been cancelled.

## 2018-06-17 NOTE — Telephone Encounter (Signed)
Yes, please cancel ultrasound.

## 2018-06-17 NOTE — Telephone Encounter (Signed)
AB see Doris's note. Do we need to cancel pts u/s due to going to the hospital?

## 2018-06-17 NOTE — Telephone Encounter (Signed)
No need to continue checking ammonia levels unless she has worsening mental status changes.   If she has worsening changes in mental status despite 3 soft BMs a day on lactulose, would likely need admission.   Keep US abdomen appointment for tomorrow.  She had leukocytosis with WBC count 24.8, documented 06/12/2018. Randall Hiss attempted to call [facility] but unable to reach. This needs to followed up on and ensure the provider there is aware and can evaluate for source of infection.

## 2018-06-18 ENCOUNTER — Ambulatory Visit (HOSPITAL_COMMUNITY): Payer: Medicare Other

## 2018-07-23 DEATH — deceased

## 2018-07-24 ENCOUNTER — Ambulatory Visit: Payer: Medicare Other | Admitting: Gastroenterology

## 2018-08-20 ENCOUNTER — Ambulatory Visit: Payer: Medicare Other | Admitting: Nurse Practitioner
